# Patient Record
Sex: Female | Born: 1945 | Race: White | Hispanic: No | Smoking: Current every day smoker
Health system: Southern US, Community
[De-identification: ages and names within clinical notes are randomized; demographics above are authoritative.]

## PROBLEM LIST (undated history)

## (undated) DIAGNOSIS — E039 Hypothyroidism, unspecified: Secondary | ICD-10-CM

## (undated) DIAGNOSIS — C3490 Malignant neoplasm of unspecified part of unspecified bronchus or lung: Secondary | ICD-10-CM

## (undated) DIAGNOSIS — E119 Type 2 diabetes mellitus without complications: Secondary | ICD-10-CM

## (undated) DIAGNOSIS — Z8673 Personal history of transient ischemic attack (TIA), and cerebral infarction without residual deficits: Secondary | ICD-10-CM

## (undated) DIAGNOSIS — Z923 Personal history of irradiation: Secondary | ICD-10-CM

## (undated) DIAGNOSIS — R112 Nausea with vomiting, unspecified: Secondary | ICD-10-CM

## (undated) DIAGNOSIS — C801 Malignant (primary) neoplasm, unspecified: Secondary | ICD-10-CM

## (undated) DIAGNOSIS — J449 Chronic obstructive pulmonary disease, unspecified: Secondary | ICD-10-CM

## (undated) DIAGNOSIS — I639 Cerebral infarction, unspecified: Secondary | ICD-10-CM

## (undated) DIAGNOSIS — Z8711 Personal history of peptic ulcer disease: Secondary | ICD-10-CM

## (undated) DIAGNOSIS — F4310 Post-traumatic stress disorder, unspecified: Secondary | ICD-10-CM

## (undated) DIAGNOSIS — F32A Depression, unspecified: Secondary | ICD-10-CM

## (undated) DIAGNOSIS — T4145XA Adverse effect of unspecified anesthetic, initial encounter: Secondary | ICD-10-CM

## (undated) DIAGNOSIS — R52 Pain, unspecified: Secondary | ICD-10-CM

## (undated) DIAGNOSIS — R0602 Shortness of breath: Secondary | ICD-10-CM

## (undated) DIAGNOSIS — F419 Anxiety disorder, unspecified: Secondary | ICD-10-CM

## (undated) DIAGNOSIS — Z8489 Family history of other specified conditions: Secondary | ICD-10-CM

## (undated) DIAGNOSIS — Z9889 Other specified postprocedural states: Secondary | ICD-10-CM

## (undated) DIAGNOSIS — M1990 Unspecified osteoarthritis, unspecified site: Secondary | ICD-10-CM

## (undated) DIAGNOSIS — Z8719 Personal history of other diseases of the digestive system: Secondary | ICD-10-CM

## (undated) DIAGNOSIS — I10 Essential (primary) hypertension: Secondary | ICD-10-CM

## (undated) DIAGNOSIS — F17200 Nicotine dependence, unspecified, uncomplicated: Secondary | ICD-10-CM

## (undated) DIAGNOSIS — N951 Menopausal and female climacteric states: Secondary | ICD-10-CM

## (undated) DIAGNOSIS — J189 Pneumonia, unspecified organism: Secondary | ICD-10-CM

## (undated) DIAGNOSIS — F329 Major depressive disorder, single episode, unspecified: Secondary | ICD-10-CM

## (undated) HISTORY — DX: Major depressive disorder, single episode, unspecified: F32.9

## (undated) HISTORY — DX: Unspecified osteoarthritis, unspecified site: M19.90

## (undated) HISTORY — DX: Malignant neoplasm of unspecified part of unspecified bronchus or lung: C34.90

## (undated) HISTORY — PX: HERNIA REPAIR: SHX51

## (undated) HISTORY — DX: Type 2 diabetes mellitus without complications: E11.9

## (undated) HISTORY — DX: Anxiety disorder, unspecified: F41.9

## (undated) HISTORY — PX: CARPAL TUNNEL RELEASE: SHX101

## (undated) HISTORY — DX: Essential (primary) hypertension: I10

## (undated) HISTORY — PX: SALPINGOOPHORECTOMY: SHX82

## (undated) HISTORY — PX: VAGINAL HYSTERECTOMY: SUR661

## (undated) HISTORY — PX: TONSILLECTOMY: SUR1361

## (undated) HISTORY — PX: NASAL SINUS SURGERY: SHX719

## (undated) HISTORY — DX: Personal history of transient ischemic attack (TIA), and cerebral infarction without residual deficits: Z86.73

## (undated) HISTORY — DX: Depression, unspecified: F32.A

## (undated) HISTORY — DX: Post-traumatic stress disorder, unspecified: F43.10

## (undated) HISTORY — PX: LUMBAR FUSION: SHX111

## (undated) HISTORY — PX: CERVICAL FUSION: SHX112

---

## 2001-12-28 ENCOUNTER — Emergency Department (HOSPITAL_COMMUNITY): Admission: EM | Admit: 2001-12-28 | Discharge: 2001-12-28 | Payer: Self-pay | Admitting: Emergency Medicine

## 2002-10-11 ENCOUNTER — Encounter: Payer: Self-pay | Admitting: Otolaryngology

## 2002-10-11 ENCOUNTER — Ambulatory Visit (HOSPITAL_COMMUNITY): Admission: RE | Admit: 2002-10-11 | Discharge: 2002-10-11 | Payer: Self-pay | Admitting: Otolaryngology

## 2003-01-05 ENCOUNTER — Ambulatory Visit (HOSPITAL_COMMUNITY): Admission: RE | Admit: 2003-01-05 | Discharge: 2003-01-05 | Payer: Self-pay | Admitting: Otolaryngology

## 2003-01-05 ENCOUNTER — Encounter: Payer: Self-pay | Admitting: Otolaryngology

## 2003-05-20 ENCOUNTER — Emergency Department (HOSPITAL_COMMUNITY): Admission: EM | Admit: 2003-05-20 | Discharge: 2003-05-20 | Payer: Self-pay | Admitting: Obstetrics & Gynecology

## 2003-05-20 ENCOUNTER — Encounter: Payer: Self-pay | Admitting: Emergency Medicine

## 2003-10-07 ENCOUNTER — Encounter: Payer: Self-pay | Admitting: Family Medicine

## 2003-10-07 ENCOUNTER — Ambulatory Visit (HOSPITAL_COMMUNITY): Admission: RE | Admit: 2003-10-07 | Discharge: 2003-10-07 | Payer: Self-pay | Admitting: Orthopedic Surgery

## 2004-10-16 ENCOUNTER — Emergency Department (HOSPITAL_COMMUNITY): Admission: EM | Admit: 2004-10-16 | Discharge: 2004-10-16 | Payer: Self-pay | Admitting: Emergency Medicine

## 2004-10-19 ENCOUNTER — Ambulatory Visit (HOSPITAL_COMMUNITY): Admission: RE | Admit: 2004-10-19 | Discharge: 2004-10-19 | Payer: Self-pay | Admitting: Orthopedic Surgery

## 2004-12-28 ENCOUNTER — Ambulatory Visit: Payer: Self-pay | Admitting: Internal Medicine

## 2005-01-16 ENCOUNTER — Ambulatory Visit: Payer: Self-pay | Admitting: Internal Medicine

## 2005-01-17 ENCOUNTER — Inpatient Hospital Stay (HOSPITAL_COMMUNITY): Admission: RE | Admit: 2005-01-17 | Discharge: 2005-01-18 | Payer: Self-pay | Admitting: Orthopaedic Surgery

## 2005-04-18 ENCOUNTER — Encounter (HOSPITAL_COMMUNITY): Admission: RE | Admit: 2005-04-18 | Discharge: 2005-05-18 | Payer: Self-pay | Admitting: Orthopaedic Surgery

## 2005-06-06 ENCOUNTER — Ambulatory Visit (HOSPITAL_COMMUNITY): Admission: RE | Admit: 2005-06-06 | Discharge: 2005-06-06 | Payer: Self-pay | Admitting: Otolaryngology

## 2006-01-21 ENCOUNTER — Emergency Department (HOSPITAL_COMMUNITY): Admission: EM | Admit: 2006-01-21 | Discharge: 2006-01-21 | Payer: Self-pay | Admitting: Emergency Medicine

## 2006-03-14 ENCOUNTER — Emergency Department (HOSPITAL_COMMUNITY): Admission: EM | Admit: 2006-03-14 | Discharge: 2006-03-15 | Payer: Self-pay | Admitting: Emergency Medicine

## 2006-06-09 ENCOUNTER — Ambulatory Visit: Payer: Self-pay | Admitting: Physical Medicine & Rehabilitation

## 2006-06-09 ENCOUNTER — Encounter
Admission: RE | Admit: 2006-06-09 | Discharge: 2006-09-07 | Payer: Self-pay | Admitting: Physical Medicine & Rehabilitation

## 2006-06-30 ENCOUNTER — Ambulatory Visit (HOSPITAL_COMMUNITY): Admission: RE | Admit: 2006-06-30 | Discharge: 2006-06-30 | Payer: Self-pay | Admitting: General Surgery

## 2006-06-30 ENCOUNTER — Encounter (INDEPENDENT_AMBULATORY_CARE_PROVIDER_SITE_OTHER): Payer: Self-pay | Admitting: Specialist

## 2007-03-24 ENCOUNTER — Ambulatory Visit (HOSPITAL_COMMUNITY): Admission: RE | Admit: 2007-03-24 | Discharge: 2007-03-24 | Payer: Self-pay | Admitting: Family Medicine

## 2007-05-20 ENCOUNTER — Ambulatory Visit (HOSPITAL_COMMUNITY): Admission: RE | Admit: 2007-05-20 | Discharge: 2007-05-20 | Payer: Self-pay | Admitting: Family Medicine

## 2007-11-23 ENCOUNTER — Ambulatory Visit (HOSPITAL_COMMUNITY): Admission: RE | Admit: 2007-11-23 | Discharge: 2007-11-23 | Payer: Self-pay | Admitting: Family Medicine

## 2007-11-27 ENCOUNTER — Ambulatory Visit (HOSPITAL_COMMUNITY): Admission: RE | Admit: 2007-11-27 | Discharge: 2007-11-27 | Payer: Self-pay | Admitting: Family Medicine

## 2007-12-03 ENCOUNTER — Ambulatory Visit (HOSPITAL_COMMUNITY): Admission: RE | Admit: 2007-12-03 | Discharge: 2007-12-03 | Payer: Self-pay

## 2008-08-03 ENCOUNTER — Ambulatory Visit (HOSPITAL_COMMUNITY): Admission: RE | Admit: 2008-08-03 | Discharge: 2008-08-03 | Payer: Self-pay | Admitting: Family Medicine

## 2008-08-30 ENCOUNTER — Ambulatory Visit (HOSPITAL_COMMUNITY): Admission: RE | Admit: 2008-08-30 | Discharge: 2008-08-30 | Payer: Self-pay | Admitting: Family Medicine

## 2008-09-13 ENCOUNTER — Ambulatory Visit (HOSPITAL_COMMUNITY): Admission: RE | Admit: 2008-09-13 | Discharge: 2008-09-13 | Payer: Self-pay | Admitting: Family Medicine

## 2009-06-10 ENCOUNTER — Emergency Department (HOSPITAL_COMMUNITY): Admission: EM | Admit: 2009-06-10 | Discharge: 2009-06-10 | Payer: Self-pay | Admitting: Emergency Medicine

## 2009-07-02 ENCOUNTER — Emergency Department (HOSPITAL_COMMUNITY): Admission: EM | Admit: 2009-07-02 | Discharge: 2009-07-02 | Payer: Self-pay | Admitting: Emergency Medicine

## 2009-12-27 ENCOUNTER — Ambulatory Visit (HOSPITAL_COMMUNITY): Admission: RE | Admit: 2009-12-27 | Discharge: 2009-12-27 | Payer: Self-pay | Admitting: Family Medicine

## 2010-01-04 ENCOUNTER — Emergency Department (HOSPITAL_COMMUNITY): Admission: EM | Admit: 2010-01-04 | Discharge: 2010-01-04 | Payer: Self-pay | Admitting: Emergency Medicine

## 2010-01-11 ENCOUNTER — Ambulatory Visit: Payer: Self-pay | Admitting: Orthopedic Surgery

## 2010-01-11 DIAGNOSIS — S92309A Fracture of unspecified metatarsal bone(s), unspecified foot, initial encounter for closed fracture: Secondary | ICD-10-CM

## 2010-01-11 DIAGNOSIS — S92209A Fracture of unspecified tarsal bone(s) of unspecified foot, initial encounter for closed fracture: Secondary | ICD-10-CM

## 2010-01-15 ENCOUNTER — Telehealth: Payer: Self-pay | Admitting: Orthopedic Surgery

## 2010-01-17 ENCOUNTER — Encounter: Payer: Self-pay | Admitting: Orthopedic Surgery

## 2010-01-23 ENCOUNTER — Telehealth: Payer: Self-pay | Admitting: Orthopedic Surgery

## 2010-01-29 ENCOUNTER — Telehealth: Payer: Self-pay | Admitting: Orthopedic Surgery

## 2010-02-26 ENCOUNTER — Ambulatory Visit: Payer: Self-pay | Admitting: Orthopedic Surgery

## 2010-02-27 ENCOUNTER — Telehealth: Payer: Self-pay | Admitting: Orthopedic Surgery

## 2010-03-06 ENCOUNTER — Telehealth: Payer: Self-pay | Admitting: Orthopedic Surgery

## 2010-03-28 ENCOUNTER — Emergency Department (HOSPITAL_COMMUNITY): Admission: EM | Admit: 2010-03-28 | Discharge: 2010-03-28 | Payer: Self-pay | Admitting: Emergency Medicine

## 2010-04-16 ENCOUNTER — Encounter: Admission: RE | Admit: 2010-04-16 | Discharge: 2010-04-16 | Payer: Self-pay | Admitting: Orthopedic Surgery

## 2010-05-08 ENCOUNTER — Ambulatory Visit: Payer: Self-pay | Admitting: Vascular Surgery

## 2010-05-30 ENCOUNTER — Encounter: Payer: Self-pay | Admitting: Orthopedic Surgery

## 2010-06-12 ENCOUNTER — Ambulatory Visit (HOSPITAL_COMMUNITY): Admission: RE | Admit: 2010-06-12 | Discharge: 2010-06-12 | Payer: Self-pay | Admitting: Family Medicine

## 2010-06-25 ENCOUNTER — Ambulatory Visit (HOSPITAL_COMMUNITY): Admission: RE | Admit: 2010-06-25 | Discharge: 2010-06-25 | Payer: Self-pay | Admitting: Family Medicine

## 2010-08-05 ENCOUNTER — Inpatient Hospital Stay (HOSPITAL_COMMUNITY)
Admission: EM | Admit: 2010-08-05 | Discharge: 2010-08-07 | Payer: Self-pay | Source: Home / Self Care | Admitting: Emergency Medicine

## 2010-12-30 DIAGNOSIS — I639 Cerebral infarction, unspecified: Secondary | ICD-10-CM

## 2010-12-30 HISTORY — DX: Cerebral infarction, unspecified: I63.9

## 2011-01-19 ENCOUNTER — Encounter: Payer: Self-pay | Admitting: Otolaryngology

## 2011-01-21 ENCOUNTER — Ambulatory Visit (HOSPITAL_COMMUNITY)
Admission: RE | Admit: 2011-01-21 | Discharge: 2011-01-21 | Payer: Self-pay | Source: Home / Self Care | Attending: Family Medicine | Admitting: Family Medicine

## 2011-01-21 ENCOUNTER — Encounter: Payer: Self-pay | Admitting: Internal Medicine
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
## 2011-01-31 NOTE — Progress Notes (Signed)
Summary: pain med not helping, and has chest pain  Phone Note Call from Patient Call back at Home Phone [phone]   Summary of Call: called stating that the pain med is not helping, and she is on Norco 10, I advised that the med is strong and should help her pain, she is allergic to NSAIDS, she said walker is making her arms hurt, advised we will send rx for a wheelchair, said she has chest pain, advised go to er or PCP. Initial call taken by: Ether Griffins,  January 15, 2010 10:41 AM    New/Updated Medications: WHEELCHAIR  MISC (MISC. DEVICES) standard wheelchair  has fracture of foot Prescriptions: WHEELCHAIR  MISC (MISC. DEVICES) standard wheelchair  has fracture of foot  #1 x 0   Entered by:   Ether Griffins   Authorized by:   Fuller Canada MD   Signed by:   Ether Griffins on 01/15/2010   Method used:   Faxed to ...       [facility] (retail)       [address]       [place], [place]  [address]       Ph: [phone]       Fax: [phone]   RxID:   [id number]
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
## 2011-01-31 NOTE — Letter (Signed)
Summary: Medical record request [facility]  Medical record request [facility]   Imported By: Cammie Sickle 06/20/2010 20:41:12  _____________________________________________________________________  External Attachment:    Type:   Image     Comment:   External Document

## 2011-01-31 NOTE — Progress Notes (Signed)
Summary: call from patient foot swollen today  Phone Note Call from Patient   Caller: Patient Summary of Call: Patient called to relay that her injured foot is very swollen today, top and bottom of foot. Had appointment yesterday 02/26/10 and was released, as needed appointment.  Advised about elevating, icing, etc.  She said she has been doing this, and has been back in wheelchair today. Please call her [phone] if other recommendations. Initial call taken by: Cammie Sickle,  February 27, 2010 10:46 AM
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
## 2011-01-31 NOTE — Progress Notes (Signed)
Summary: wants pain med/foot still swollen  Phone Note Call from Patient   Summary of Call: Shaquayla Klimas (September 13, 2046) left a message requesting for you to call in Vicodin 5 mg  to [facility] for her residual foot pain. Said her foot is still swollen and asked for advice on what she can do.  Her # [phone] Initial call taken by: Jacklynn Ganong,  March 06, 2010 2:22 PM  Follow-up for Phone Call        no more pain medication   [and need no other appts] Follow-up by: Fuller Canada MD,  March 06, 2010 2:26 PM  Additional Follow-up for Phone Call Additional follow up Details #1::        Routed to [facility] to call patient Additional Follow-up by: Jacklynn Ganong,  March 06, 2010 3:40 PM    Additional Follow-up for Phone Call Additional follow up Details #2::    Advised patient.  She said thanks and quickly hung up. Follow-up by: Cammie Sickle,  March 06, 2010 4:37 PM

## 2011-01-31 NOTE — Progress Notes (Signed)
Summary: requested early pain med refill  Phone Note Call from Patient Call back at [facility] Phone [phone]   Summary of Call: patient wanted early refill on medication advised no Initial call taken by: Ether Griffins,  January 23, 2010 10:23 AM

## 2011-01-31 NOTE — Letter (Signed)
Summary: History form  History form   Imported By: Jacklynn Ganong 01/15/2010 14:45:01  _____________________________________________________________________  External Attachment:    Type:   Image     Comment:   External Document

## 2011-03-15 LAB — CBC
HCT: 37.9 % (ref 36.0–46.0)
Hemoglobin: 12.8 g/dL (ref 12.0–15.0)
MCH: 32.1 pg (ref 26.0–34.0)
MCHC: 33.9 g/dL (ref 30.0–36.0)
MCV: 94.9 fL (ref 78.0–100.0)
Platelets: 247 K/uL (ref 150–400)
RBC: 3.99 MIL/uL (ref 3.87–5.11)
RDW: 13.7 % (ref 11.5–15.5)
WBC: 8.9 K/uL (ref 4.0–10.5)

## 2011-03-15 LAB — BASIC METABOLIC PANEL WITH GFR
BUN: 2 mg/dL — ABNORMAL LOW (ref 6–23)
BUN: 2 mg/dL — ABNORMAL LOW (ref 6–23)
CO2: 31 meq/L (ref 19–32)
CO2: 32 meq/L (ref 19–32)
Calcium: 8.2 mg/dL — ABNORMAL LOW (ref 8.4–10.5)
Calcium: 8.4 mg/dL (ref 8.4–10.5)
Chloride: 103 meq/L (ref 96–112)
Chloride: 104 meq/L (ref 96–112)
Creatinine, Ser: 0.5 mg/dL (ref 0.4–1.2)
Creatinine, Ser: 0.54 mg/dL (ref 0.4–1.2)
GFR calc Af Amer: >60 mL/min (ref >60)
GFR calc Af Amer: >60 mL/min (ref >60)
GFR calc non Af Amer: >60 mL/min (ref >60)
GFR calc non Af Amer: >60 mL/min (ref >60)
Glucose, Bld: 91 mg/dL (ref 70–99)
Glucose, Bld: 92 mg/dL (ref 70–99)
Potassium: 2.8 meq/L — ABNORMAL LOW (ref 3.5–5.1)
Potassium: 3 meq/L — ABNORMAL LOW (ref 3.5–5.1)
Sodium: 140 meq/L (ref 135–145)
Sodium: 142 meq/L (ref 135–145)

## 2011-03-15 LAB — WOUND CULTURE

## 2011-03-15 LAB — DIFFERENTIAL
Basophils Absolute: 0 K/uL (ref 0.0–0.1)
Basophils Relative: 0 % (ref 0–1)
Eosinophils Absolute: 0.1 K/uL (ref 0.0–0.7)
Eosinophils Relative: 1 % (ref 0–5)
Lymphocytes Relative: 28 % (ref 12–46)
Lymphs Abs: 2.5 K/uL (ref 0.7–4.0)
Monocytes Absolute: 0.5 K/uL (ref 0.1–1.0)
Monocytes Relative: 6 % (ref 3–12)
Neutro Abs: 5.8 K/uL (ref 1.7–7.7)
Neutrophils Relative %: 65 % (ref 43–77)

## 2011-03-15 LAB — PROTIME-INR: Prothrombin Time: 13.5 seconds (ref 11.6–15.2)

## 2011-03-15 LAB — GLUCOSE, CAPILLARY: Glucose-Capillary: 88 mg/dL (ref 70–99)

## 2011-03-15 LAB — BASIC METABOLIC PANEL
BUN: 5 mg/dL — ABNORMAL LOW (ref 6–23)
CO2: 33 mEq/L — ABNORMAL HIGH (ref 19–32)
Chloride: 101 mEq/L (ref 96–112)
Creatinine, Ser: 0.54 mg/dL (ref 0.4–1.2)

## 2011-03-17 LAB — COMPREHENSIVE METABOLIC PANEL
ALT: 11 U/L (ref 0–35)
AST: 18 U/L (ref 0–37)
Albumin: 3.2 g/dL — ABNORMAL LOW (ref 3.5–5.2)
Alkaline Phosphatase: 87 U/L (ref 39–117)
Chloride: 101 mEq/L (ref 96–112)
GFR calc Af Amer: >60 mL/min (ref >60)
Potassium: 3.6 mEq/L (ref 3.5–5.1)
Sodium: 143 mEq/L (ref 135–145)
Total Bilirubin: 0.4 mg/dL (ref 0.3–1.2)

## 2011-03-17 LAB — CBC
HCT: 43.6 % (ref 36.0–46.0)
Platelets: 247 10*3/uL (ref 150–400)
WBC: 8.1 10*3/uL (ref 4.0–10.5)

## 2011-03-17 LAB — DIFFERENTIAL
Basophils Absolute: 0 10*3/uL (ref 0.0–0.1)
Basophils Relative: 1 % (ref 0–1)
Eosinophils Relative: 1 % (ref 0–5)
Monocytes Absolute: 0.4 10*3/uL (ref 0.1–1.0)

## 2011-04-07 LAB — COMPREHENSIVE METABOLIC PANEL
ALT: 11 U/L (ref 0–35)
Alkaline Phosphatase: 71 U/L (ref 39–117)
BUN: 2 mg/dL — ABNORMAL LOW (ref 6–23)
CO2: 32 mEq/L (ref 19–32)
GFR calc non Af Amer: >60 mL/min (ref >60)
Glucose, Bld: 101 mg/dL — ABNORMAL HIGH (ref 70–99)
Potassium: 3.3 mEq/L — ABNORMAL LOW (ref 3.5–5.1)
Total Bilirubin: 0.4 mg/dL (ref 0.3–1.2)
Total Protein: 5.8 g/dL — ABNORMAL LOW (ref 6.0–8.3)

## 2011-04-07 LAB — DIFFERENTIAL
Basophils Absolute: 0 10*3/uL (ref 0.0–0.1)
Basophils Relative: 0 % (ref 0–1)
Eosinophils Absolute: 0.1 10*3/uL (ref 0.0–0.7)
Monocytes Relative: 6 % (ref 3–12)
Neutro Abs: 4.4 10*3/uL (ref 1.7–7.7)
Neutrophils Relative %: 68 % (ref 43–77)

## 2011-04-07 LAB — URINALYSIS, ROUTINE W REFLEX MICROSCOPIC
Bilirubin Urine: NEGATIVE
Glucose, UA: NEGATIVE mg/dL
Ketones, ur: NEGATIVE mg/dL
Specific Gravity, Urine: <1.005 — ABNORMAL LOW (ref 1.005–1.030)
pH: 5.5 (ref 5.0–8.0)

## 2011-04-07 LAB — CBC
HCT: 36.9 % (ref 36.0–46.0)
Hemoglobin: 12.8 g/dL (ref 12.0–15.0)
RBC: 3.78 MIL/uL — ABNORMAL LOW (ref 3.87–5.11)
RDW: 13.3 % (ref 11.5–15.5)
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
## 2011-05-17 NOTE — Assessment & Plan Note (Signed)
[name] is back regarding her chronic pain syndrome, predominantly in the neck  and low back.  Since I last saw her, she had an abdominal hernia repair by  Dr. Lovell Sheehan.  She received hydrocodone, #40, from him, which she has  completed.  She also had some minor skin surgery performed to remove a mole  on a spot from the face.  Generally, she has done well with those surgeries.  She has liked the changes we have made with her medications and has done  well with the fentanyl patch.  She uses a Lidoderm patch as well with good  results.  Uses oxycodone 5 mg daily for breakthrough pain.  In general, she  is well pleased with how she is doing with the medication changes.  She has  had some resolution in family matters, which has helped as well.   Patient did test positive for marijuana in her urine drug screen on  admission.  Patient states that she has not smoked any marijuana for over 6-  7 weeks and that was a limited exposure.   Patient describes the pain as dull, rates it as a 5/10.  The pain interferes  with general activity, relationships with others, and enjoyment of life on a  moderate level.  She can walk about 15 minutes without having to stop.  She  sometimes uses a cane for balance but generally walks without a device.   REVIEW OF SYSTEMS:  Patient reports some tremor and occasional spasms.  Anxiety.  She states that she would do well with an occasional Xanax for  breakthrough anxiety, especially with some of her complex family reactions.  The patient has had some recent wheezing and respiratory infections as well.   SOCIAL HISTORY:  Patient lives with her granddaughter and still smokes about  a half pack per day.   PHYSICAL EXAMINATION:  VITAL SIGNS:  Blood pressure 130/96, pulse 68,  respiratory rate 16.  She is satting at 96% on room air.  GENERAL:  Patient is pleasant in no acute distress.  She is alert and  oriented x3.  Affect is bright and appropriate.  Gait is stable.   She has  some tenderness along the abdominal surgery site, but this appears to be  healing nicely.  She has a Lidoderm patch over the area, which provided  relief.  HEART:  Regular rate and rhythm.  LUNGS:  Clear.  ABDOMEN:  Soft and nontender.  NEUROMUSCULAR:  Coordination is generally intact.  Reflexes are 2+.  Sensation is intact in all four extremities.  Motor function is 5/5.  She  had excellent back range of motion today as well as cervical movement.  I  saw no significant pain in the patient.  She had some mild tenderness along  the lumbar facets with palpation as well as passive movement of the neck to  extreme flexion and rotational borders.   ASSESSMENT:  1.  Post laminectomy cervical and lumbar syndromes.  2.  History of marijuana use.  3.  Anxiety disorder.   PLAN:  1.  Continue fentanyl patch 12.5 mcg q.72h.  2.  Refill oxycodone 5 mg daily p.r.n. #30.  3.  I did give her a limited supply of Xanax 0.5 mg daily p.r.n. severe      anxiety.  4.  We retested her urine today.  I had informed her that if she should test      positive for marijuana again, that she will be discharged from the  clinic.  Patient voiced understanding of this and was confident that      this would no      longer be an issue.  5.  I will see the patient back in about three months time.  She will have      an RN visit in one month.      Ranelle Oyster, M.D.  Electronically Signed     ZTS/MedQ  D:  07/08/2006 44:01:02  T:  07/08/2006 17:26:03  Job #:  [id number]   cc:   Sharolyn Douglas, M.D.  Fax: [phone]
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
## 2011-05-17 NOTE — Op Note (Signed)
NAME:  [name], [name]              ACCOUNT NO.:  [id number]   MEDICAL RECORD NO.:  [id number]          PATIENT TYPE:  AMB   LOCATION:  DAY                           FACILITY:  [facility]   PHYSICIAN:  Dalia Heading, M.D.  DATE OF BIRTH:  Jun 14, 1946   DATE OF PROCEDURE:  06/30/2006  DATE OF DISCHARGE:                                 OPERATIVE REPORT   PREOPERATIVE DIAGNOSES:  Umbilical hernia, benign skin lesions, face.   POSTOPERATIVE DIAGNOSES:  Umbilical hernia, benign skin lesions, face.   PROCEDURE:  Umbilical herniorrhaphy, shave removal of multiple skin lesions,  face.   SURGEON:  Dr. Franky Macho   ANESTHESIA:  General.   INDICATIONS:  The patient is a 65 year old white female presents with both  an umbilical hernia and several benign skin lesions on her right eyelid.  Three of them are skin tags and the fourth one is a small seborrheic  keratotic lesion.  The patient requests removal of these lesions as well as  a repair of her umbilical hernia.  The risks and benefits of all procedures  including bleeding, infection, recurrence were fully explained to the  patient, who gave informed consent.   PROCEDURE NOTE:  The patient was placed in the supine position.  After  induction of general endotracheal anesthesia, the abdomen was prepped and  draped using the usual sterile technique with Betadine.  Surgical site  confirmation was performed.   An infraumbilical incision was made down to the fascia.  The umbilicus was  freed away from the underlying hernia sac.  Properitoneal fat was noted  within the hernia sac.  This was excised at the fascial level.  The fascia  was then reapproximated transversely using O Surgidac interrupted sutures.  The base of the umbilicus was secured back to the fascia using a 2-0 Vicryl  suture.  The subcutaneous layer was closed using 3-0 Vicryl interrupted  sutures.  The skin was closed using staples.  Sensorcaine 0.5% was instilled  in the  surrounding wound.  Betadine ointment and dry sterile dressing were  applied.   Next, the right eyelid was prepped with Betadine.  Three skin tags were  shaved off using cautery.  A seborrheic keratotic lesion along the medial  canthal fold was also excised without difficulty.  This was done using shave  removal and cautery.  Ophthalmic bacitracin ointment was then applied.   All tape and needle counts were correct at the end of the procedure.  The  patient was awakened and transferred to PACU in stable condition.   COMPLICATIONS:  None.   SPECIMEN:  Benign skin lesion, right eyelid.   BLOOD LOSS:  None.      Dalia Heading, M.D.  Electronically Signed     MAJ/MEDQ  D:  06/30/2006  T:  06/30/2006  Job:  [id number]   cc:   Dalia Heading, M.D.  Fax: [phone]   [name], MD  Fax: [phone]

## 2011-05-17 NOTE — Op Note (Signed)
[name], [name]              ACCOUNT NO.:  [id number]   MEDICAL RECORD NO.:  [id number]          PATIENT TYPE:  OIB   LOCATION:  5004                         FACILITY:  [facility]   PHYSICIAN:  Sharolyn Douglas, M.D.        DATE OF BIRTH:  July 10, 1946   DATE OF PROCEDURE:  01/17/2005  DATE OF DISCHARGE:                                 OPERATIVE REPORT   DIAGNOSIS:  Cervical spondylotic radiculopathy.   PROCEDURE:  1.  Anterior cervical diskectomy C5-6 and C6-7.  2.  Anterior cervical arthrodesis C5-6, C6-7 placement of two allograft      prosthesis spacers packed with local autogenous bone graft.  3.  Anterior cervical plating C5-C7 using the spinal concepts system.   SURGEON:  Sharolyn Douglas, M.D.   ASSISTANT:  Verlin Fester, P.A.   ANESTHESIA:  General endotracheal.   COMPLICATIONS:  None.   INDICATIONS:  The patient is a pleasant 65 year old female with persistent  neck and right upper extremity pain thought to be secondary to cervical  spondylosis at C5-6, C6-7. She failed conservative treatment and elected to  undergo ACDF at C5-6 and C6-7 in hopes of improving her symptoms. Risks and  benefits reviewed.   PROCEDURE:  The patient was properly identified in the holding area, taken  to the operating room, underwent general endotracheal anesthesia without  difficulty. Given prophylactic IV antibiotics. Carefully positioned with the  [place] head rest, neck in neutral position. Neck was prepped, draped usual  sterile fashion. A transverse incision was made level of the cricoid  cartilage on the left side. Dissection was carried sharply through the  platysma. The antral between the SCM the strap muscles was developed down  the prevertebral space. The C5-6 disk space was identified by the anterior  osteophyte, spinal needle placed and intraoperative x-ray confirmed  location. The longus coli muscle elevated out of the C5-6, C6-7 disk spaces  bilaterally. Deep Shadowline retractor  placed. The surgical microscope was  brought into the field. Starting at C6-7, a diskectomy was completed back to  the posterior longitudinal ligament. Caspar distraction pins were placed in  the C6 and C7 vertebral bodies. Gentle distraction applied. High-speed bur  used to take down the cartilaginous endplates and the uncovertebral joints.  The posterior longitudinal ligament was taken down. A subligamentous disk  herniation was removed from the foramen on the right side. Wide  foraminotomies were completed. We then turned our attention to the C5-6  level. The distraction pin was removed from C7 and inserted into the C5  vertebral body. The disk space was severely narrowed at this level. As we  applied distraction, the pin in the C6 body cut out. We therefore removed  the pins and proceeded to complete the operation without distraction. High-  speed bur was used to widen the intervertebral space. The decompression was  carried back to the posterior cortex. We then inserted a 5 mm lordotic  allograft prosthesis spacer packed with local autogenous bone graft from the  drill shavings. We felt we had good distraction of the interspace by using  the wedge-shaped lordotic  graft. We utilized an 8 mm intervertebral  prosthesis spacer at C6-7 and packed with local bone graft obtained from the  drill shavings. We countersunk the grafts 1 mm. We then placed an anterior  cervical plate from G3-O7 using the spinal concepts system. Six 12 mm screws  were utilized. The bone quality was very soft and the screw purchase was  marginal. We ensured the locking mechanism engaged. Wound was irrigated.  Deep Penrose drain was left in place. Platysma closed with interrupted 2-0  Vicryl, subcutaneous layer closed with interrupted 3-0 Vicryl followed by a  running 4-0 subcuticular Vicryl suture on the skin edges. Benzoin, Steri-  Strips placed. Cervical collar was applied. The patient was extubated  without  difficulty and transferred to recovery in stable condition.      MC/MEDQ  D:  01/17/2005  T:  01/18/2005  Job:  [id number]

## 2011-05-17 NOTE — H&P (Signed)
NAME:  DELVA, DERDEN              ACCOUNT NO.:  [id number]   MEDICAL RECORD NO.:  [id number]          PATIENT TYPE:  AMB   LOCATION:  DAY                           FACILITY:  [facility]   PHYSICIAN:  Dalia Heading, M.D.  DATE OF BIRTH:  1946-03-16   DATE OF ADMISSION:  06/30/2006  DATE OF DISCHARGE:  LH                                HISTORY & PHYSICAL   CHIEF COMPLAINT:  Umbilical hernia, skin lesions on face.   HISTORY OF PRESENT ILLNESS:  The patient is a 65 year old white female who  was referred for evaluation and treatment of an umbilical hernia.  It has  been present for some time but is causing her increasing discomfort.  No  nausea or vomiting have been noted.  She also has a right intercanthal eye  skin lesion which she would like to remove along with several skin tags over  the right eyelid.   PAST MEDICAL HISTORY:  Includes:  1.  Chronic back pain.  2.  Depression.  3.  Hypertension.  4.  Extrinsic allergies.   PAST SURGICAL HISTORY:  1.  Spinal cervical spinal fusion.  2.  Lower lumbar spinal fusion.  3.  Right oophorectomy.  4.  Carpal tunnel release bilaterally.  5.  Hysterectomy.   CURRENT MEDICATIONS:  1.  Trazodone 150 mg p.o. daily.  2.  Lisinopril 20 mg p.o. daily.  3.  Fentanyl patches.  4.  Lidocaine patches.  5.  Zoloft 100 mg p.o. q.a.m.  6.  Allegra 1 tablet p.o. daily.  7.  Atenolol 25 mg p.o. b.i.d.  8.  Vicodin p.r.n. pain.   ALLERGIES:  ASPIRIN and NONSTEROIDAL ANTI-INFLAMMATORIES.   REVIEW OF SYSTEMS:  The patient does smoke one-half pack of cigarettes a  day.  She denies alcohol use.  She denies any recent angina, chest pain, MI,  CVA, or diabetes mellitus.   PHYSICAL EXAMINATION:  GENERAL: The patient is a well-developed, well-  nourished white female in no acute distress.  SKIN: Examination reveals seborrheic keratotic lesion along the right inner  canthal eye fold with several skin tags present along the right eyelid.  LUNGS:  Clear  to auscultation with equal breath sounds bilaterally.  HEART:  Examination reveals regular rate without S3, S4, or murmurs.  ABDOMEN: The abdomen is soft, nontender, nondistended.  No  hepatosplenomegaly or masses noted.  A small reducible umbilical hernia is  present.   IMPRESSION:  1.  Umbilical hernia.  2.  Skin lesions, right eye canthal fold.   PLAN:  The patient is scheduled for umbilical herniorrhaphy with removal a  skin lesions on the face on June 30, 2006.  The risks and benefits of the  procedure including bleeding, infection, recurrence of the hernia were fully  explained to the patient, gave informed consent.      Dalia Heading, M.D.  Electronically Signed     MAJ/MEDQ  D:  06/24/2006  T:  06/24/2006  Job:  [id number]   cc:   [facility]  Fax: [phone]   [name], MD  Fax: [phone]

## 2011-08-05 ENCOUNTER — Inpatient Hospital Stay (HOSPITAL_COMMUNITY)
Admission: EM | Admit: 2011-08-05 | Discharge: 2011-08-10 | DRG: 690 | Disposition: A | Discharge status: Other Acute Inpatient Hospital | Payer: Medicare Other | Attending: Internal Medicine | Admitting: Internal Medicine

## 2011-08-05 DIAGNOSIS — Z8673 Personal history of transient ischemic attack (TIA), and cerebral infarction without residual deficits: Secondary | ICD-10-CM

## 2011-08-05 DIAGNOSIS — R404 Transient alteration of awareness: Secondary | ICD-10-CM | POA: Diagnosis present

## 2011-08-05 DIAGNOSIS — F1010 Alcohol abuse, uncomplicated: Secondary | ICD-10-CM | POA: Diagnosis present

## 2011-08-05 DIAGNOSIS — F1210 Cannabis abuse, uncomplicated: Secondary | ICD-10-CM | POA: Diagnosis present

## 2011-08-05 DIAGNOSIS — F1510 Other stimulant abuse, uncomplicated: Secondary | ICD-10-CM | POA: Diagnosis present

## 2011-08-05 DIAGNOSIS — F17200 Nicotine dependence, unspecified, uncomplicated: Secondary | ICD-10-CM | POA: Diagnosis present

## 2011-08-05 DIAGNOSIS — J4489 Other specified chronic obstructive pulmonary disease: Secondary | ICD-10-CM | POA: Diagnosis present

## 2011-08-05 DIAGNOSIS — F1110 Opioid abuse, uncomplicated: Secondary | ICD-10-CM | POA: Diagnosis present

## 2011-08-05 DIAGNOSIS — E876 Hypokalemia: Secondary | ICD-10-CM | POA: Diagnosis present

## 2011-08-05 DIAGNOSIS — R197 Diarrhea, unspecified: Secondary | ICD-10-CM | POA: Diagnosis present

## 2011-08-05 DIAGNOSIS — Z79899 Other long term (current) drug therapy: Secondary | ICD-10-CM

## 2011-08-05 DIAGNOSIS — J449 Chronic obstructive pulmonary disease, unspecified: Secondary | ICD-10-CM | POA: Diagnosis present

## 2011-08-05 DIAGNOSIS — I10 Essential (primary) hypertension: Secondary | ICD-10-CM | POA: Diagnosis present

## 2011-08-05 DIAGNOSIS — B372 Candidiasis of skin and nail: Secondary | ICD-10-CM | POA: Diagnosis present

## 2011-08-05 DIAGNOSIS — N390 Urinary tract infection, site not specified: Principal | ICD-10-CM | POA: Diagnosis present

## 2011-08-05 DIAGNOSIS — E871 Hypo-osmolality and hyponatremia: Secondary | ICD-10-CM | POA: Diagnosis present

## 2011-08-05 LAB — URINE MICROSCOPIC-ADD ON

## 2011-08-05 LAB — URINALYSIS, ROUTINE W REFLEX MICROSCOPIC
Glucose, UA: NEGATIVE mg/dL
Specific Gravity, Urine: 1.026 (ref 1.005–1.030)
pH: 6 (ref 5.0–8.0)

## 2011-08-06 ENCOUNTER — Emergency Department (HOSPITAL_COMMUNITY): Payer: Medicare Other

## 2011-08-06 ENCOUNTER — Inpatient Hospital Stay (HOSPITAL_COMMUNITY): Payer: Medicare Other

## 2011-08-06 DIAGNOSIS — F05 Delirium due to known physiological condition: Secondary | ICD-10-CM

## 2011-08-06 LAB — URINALYSIS, ROUTINE W REFLEX MICROSCOPIC
Bilirubin Urine: NEGATIVE
Ketones, ur: 40 mg/dL — AB
Nitrite: NEGATIVE
Protein, ur: NEGATIVE mg/dL

## 2011-08-06 LAB — CARDIAC PANEL(CRET KIN+CKTOT+MB+TROPI)
CK, MB: 6.1 ng/mL (ref 0.3–4.0)
CK, MB: 7.5 ng/mL (ref 0.3–4.0)
CK, MB: 7.6 ng/mL (ref 0.3–4.0)
Relative Index: 3.8 — ABNORMAL HIGH (ref 0.0–2.5)
Relative Index: 3.9 — ABNORMAL HIGH (ref 0.0–2.5)
Relative Index: 4.1 — ABNORMAL HIGH (ref 0.0–2.5)
Total CK: 162 U/L (ref 7–177)
Total CK: 191 U/L — ABNORMAL HIGH (ref 7–177)
Total CK: 185 U/L — ABNORMAL HIGH (ref 7–177)
Troponin I: <0.3 ng/mL
Troponin I: <0.3 ng/mL
Troponin I: <0.3 ng/mL

## 2011-08-06 LAB — DIFFERENTIAL
Basophils Relative: 1 % (ref 0–1)
Lymphs Abs: 2 10*3/uL (ref 0.7–4.0)
Monocytes Absolute: 0.5 10*3/uL (ref 0.1–1.0)
Monocytes Relative: 6 % (ref 3–12)
Neutro Abs: 6 10*3/uL (ref 1.7–7.7)

## 2011-08-06 LAB — TSH: TSH: 2.415 u[IU]/mL (ref 0.350–4.500)

## 2011-08-06 LAB — COMPREHENSIVE METABOLIC PANEL WITH GFR
ALT: 6 U/L (ref 0–35)
AST: 13 U/L (ref 0–37)
Albumin: 3.9 g/dL (ref 3.5–5.2)
Alkaline Phosphatase: 79 U/L (ref 39–117)
BUN: 10 mg/dL (ref 6–23)
CO2: 29 meq/L (ref 19–32)
Calcium: 9.1 mg/dL (ref 8.4–10.5)
Chloride: 96 meq/L (ref 96–112)
Creatinine, Ser: 0.61 mg/dL (ref 0.50–1.10)
GFR calc Af Amer: >60 mL/min
GFR calc non Af Amer: >60 mL/min
Glucose, Bld: 99 mg/dL (ref 70–99)
Potassium: 3.1 meq/L — ABNORMAL LOW (ref 3.5–5.1)
Sodium: 135 meq/L (ref 135–145)
Total Bilirubin: 0.4 mg/dL (ref 0.3–1.2)
Total Protein: 7.2 g/dL (ref 6.0–8.3)

## 2011-08-06 LAB — GLUCOSE, CAPILLARY
Glucose-Capillary: 97 mg/dL (ref 70–99)
Glucose-Capillary: 101 mg/dL — ABNORMAL HIGH (ref 70–99)
Glucose-Capillary: 110 mg/dL — ABNORMAL HIGH (ref 70–99)

## 2011-08-06 LAB — CBC
HCT: 40.2 % (ref 36.0–46.0)
Hemoglobin: 13.9 g/dL (ref 12.0–15.0)
MCH: 31.7 pg (ref 26.0–34.0)
MCHC: 34.6 g/dL (ref 30.0–36.0)
MCV: 91.8 fL (ref 78.0–100.0)

## 2011-08-06 LAB — RAPID URINE DRUG SCREEN, HOSP PERFORMED
Amphetamines: POSITIVE — AB
Opiates: POSITIVE — AB

## 2011-08-06 LAB — AMMONIA: Ammonia: 27 umol/L (ref 11–60)

## 2011-08-06 LAB — ETHANOL: Alcohol, Ethyl (B): <11 mg/dL (ref 0–11)

## 2011-08-06 LAB — URINE MICROSCOPIC-ADD ON

## 2011-08-06 LAB — MRSA PCR SCREENING: MRSA by PCR: POSITIVE — AB

## 2011-08-06 NOTE — Procedures (Signed)
EEG NUMBER:  REFERRING PHYSICIAN:  Eduard Clos, MD  HISTORY:  A 65 year old female with episode of altered awareness.  MEDICATIONS:  Tenormin, Rocephin, Parafon, folic acid, Haldol, Apresoline, Zestril, Ativan, K-Dur, Lyrica, Risperdal, Zoloft, Zofran, Vicodin.  CONDITIONS OF RECORDING:  This is a 16-channel EEG carried out with the patient in the awake, drowsy, and asleep states.  DESCRIPTION:  The waking background activity consists of a low-voltage symmetrical fairly well-organized 9 Hz alpha activity seen from the parieto-occipital and posterotemporal regions.  Low-voltage fast activity poorly organized was seen anteriorly and at times superimposed on more posterior rhythms.  A mixture of theta and alpha rhythm was seen from the central and temporal regions.  The patient drowses with slowing to irregular low-voltage theta and beta activity.  The patient goes into a light sleep with symmetrical sleep spindles and vertex with a sharp activity and irregular slow activity.  Hypoventilation and intermittent photic stimulation were not performed.  IMPRESSION:  This is a normal electroencephalogram.          ______________________________ Thana Farr, MD    ZO:[id number] D:  08/06/2011 19:04:59  T:  08/06/2011 22:26:40  Job #:  [id number]

## 2011-08-07 LAB — GLUCOSE, CAPILLARY
Glucose-Capillary: 92 mg/dL (ref 70–99)
Glucose-Capillary: 105 mg/dL — ABNORMAL HIGH (ref 70–99)

## 2011-08-07 LAB — BASIC METABOLIC PANEL
CO2: 26 mEq/L (ref 19–32)
Chloride: 102 mEq/L (ref 96–112)
GFR calc non Af Amer: >60 mL/min (ref >60)
Glucose, Bld: 83 mg/dL (ref 70–99)
Potassium: 3.9 mEq/L (ref 3.5–5.1)
Sodium: 136 mEq/L (ref 135–145)

## 2011-08-08 LAB — CBC
MCV: 92.3 fL (ref 78.0–100.0)
Platelets: 238 10*3/uL (ref 150–400)
RBC: 4.4 MIL/uL (ref 3.87–5.11)
WBC: 9 10*3/uL (ref 4.0–10.5)

## 2011-08-08 LAB — BASIC METABOLIC PANEL
CO2: 24 mEq/L (ref 19–32)
Chloride: 101 mEq/L (ref 96–112)
Creatinine, Ser: <0.47 mg/dL — ABNORMAL LOW (ref 0.50–1.10)

## 2011-08-08 LAB — GLUCOSE, CAPILLARY: Glucose-Capillary: 109 mg/dL — ABNORMAL HIGH (ref 70–99)

## 2011-08-08 LAB — GIARDIA/CRYPTOSPORIDIUM SCREEN(EIA): Cryptosporidium Screen (EIA): NEGATIVE

## 2011-08-08 LAB — CLOSTRIDIUM DIFFICILE BY PCR: Toxigenic C. Difficile by PCR: NEGATIVE

## 2011-08-08 NOTE — Consult Note (Signed)
  NAMESHAI, RASMUSSEN              ACCOUNT NO.:  [id number]  MEDICAL RECORD NO.:  [id number]  LOCATION:  5529                         FACILITY:  [facility]  PHYSICIAN:  Eulogio Ditch, MD DATE OF BIRTH:  [birth year]  DATE OF CONSULTATION:  08/06/2011 DATE OF DISCHARGE:                                CONSULTATION   REASON FOR CONSULTATION:  Altered mental status agitation.  HISTORY OF PRESENT ILLNESS:  A 65 year old female who was found confused and hardly arousable in her apartment.  The patient was brought to the hospital.  CT of the head is negative.  UA shows possible UTI.  The patient's urine drug screen is positive for amphetamines, opioids, and marijuana.  The patient is still confused, unable to provide a logical history.  She told me she do not know where she is.  She told me, I am in the room and I told her you are in a room in the hospital.  On asking about the past psych history, the patient told me that she is seeing Dr. Lorette Ang and he is giving antidepressant, but was unable to tell the details about her medications.  The patient's son had reported that the patient is using a lot of marijuana and meth and has been expanding it radically lately.  PAST MEDICAL HISTORY:  History of COPD, hypertension, previous history of TIA, CVA, osteoporosis, spinal fusion, cervical fusion.  CURRENT PSYCH MEDICATIONS: 1. Ativan 1 mg p.o. q.4 h. 2. Trazodone 100 mg three tablets at bedtime. 3. Zoloft 200 mg daily. 4. Lyrica 100 mg three times daily.  ALLERGIES:  The patient is allergic to ASPIRIN, NSAIDS, METHOCARBAMOL and PREDNISONE.  SOCIAL HISTORY:  The patient lives by herself.  On asking about drug abuse, she denied.  She told me she used marijuana 2 months ago.  MENTAL STATUS EXAM:  The patient is still confused, unable to provide logical history.  Denied hearing voices.  Denied suicidal or homicidal ideation.  She is alert and awake, but not oriented to time, place,  and person.  Memory immediate, recent remote poor.  Attention and concentration poor.  Abstraction ability poor.  Insight and judgment poor.  DIAGNOSES:  AXIS I:  Delirium not otherwise specified, polysubstance abuse/dependence. AXIS II:  Deferred. AXIS III:  See medical notes. AXIS IV:  Substance abuse. AXIS V:  40.  RECOMMENDATIONS: 1. At this time, I will start the patient on Risperdal 0.5 mg twice a     day to control her agitation, irritability, and lability. 2. I will tell clinical social worker to speak with her son to get     more information. 3. I will follow up on this patient.  Thanks for involving me in taking care of this patient.     Eulogio Ditch, MD     SA/MEDQ  D:  08/06/2011  T:  08/06/2011  Job:  [id number]  Electronically Signed by Eulogio Ditch  on 08/08/2011 08:57:09 AM

## 2011-08-09 DIAGNOSIS — F39 Unspecified mood [affective] disorder: Secondary | ICD-10-CM

## 2011-08-09 LAB — GLUCOSE, CAPILLARY: Glucose-Capillary: 102 mg/dL — ABNORMAL HIGH (ref 70–99)

## 2011-08-10 ENCOUNTER — Inpatient Hospital Stay (HOSPITAL_COMMUNITY)
Admission: AD | Admit: 2011-08-10 | Discharge: 2011-08-14 | DRG: 897 | Disposition: A | Discharge status: Home / Self Care | Payer: Medicare Other | Source: Ambulatory Visit | Attending: Psychiatry | Admitting: Psychiatry

## 2011-08-10 DIAGNOSIS — I10 Essential (primary) hypertension: Secondary | ICD-10-CM

## 2011-08-10 DIAGNOSIS — F1994 Other psychoactive substance use, unspecified with psychoactive substance-induced mood disorder: Secondary | ICD-10-CM

## 2011-08-10 DIAGNOSIS — R197 Diarrhea, unspecified: Secondary | ICD-10-CM

## 2011-08-10 DIAGNOSIS — F1910 Other psychoactive substance abuse, uncomplicated: Secondary | ICD-10-CM

## 2011-08-10 DIAGNOSIS — B379 Candidiasis, unspecified: Secondary | ICD-10-CM

## 2011-08-10 DIAGNOSIS — Z6379 Other stressful life events affecting family and household: Secondary | ICD-10-CM

## 2011-08-10 DIAGNOSIS — F1320 Sedative, hypnotic or anxiolytic dependence, uncomplicated: Principal | ICD-10-CM

## 2011-08-10 DIAGNOSIS — R404 Transient alteration of awareness: Secondary | ICD-10-CM

## 2011-08-10 DIAGNOSIS — J438 Other emphysema: Secondary | ICD-10-CM

## 2011-08-10 DIAGNOSIS — N390 Urinary tract infection, site not specified: Secondary | ICD-10-CM

## 2011-08-10 DIAGNOSIS — E871 Hypo-osmolality and hyponatremia: Secondary | ICD-10-CM

## 2011-08-10 DIAGNOSIS — F1210 Cannabis abuse, uncomplicated: Secondary | ICD-10-CM

## 2011-08-10 LAB — URINE CULTURE: Culture  Setup Time: 201208072226

## 2011-08-10 NOTE — Discharge Summary (Addendum)
  NAMEVESNA, [name]              ACCOUNT NO.:  [id number]  MEDICAL RECORD NO.:  [id number]  LOCATION:                               FACILITY:  [facility]  PHYSICIAN:  Andreas Blower, MD       DATE OF BIRTH:  15-Dec-1946  DATE OF ADMISSION:  08/09/2011 DATE OF DISCHARGE:                        DISCHARGE SUMMARY - REFERRING   ADDENDUM:  PRIMARY CARE PHYSICIAN:  [name], MD  DISCHARGE DIAGNOSES: 1. Delirium, etiology unclear.  Resolved. 2. Urinary tract infection. 3. Diarrhea resolved. 4. Hyponatremia resolved. 5. Candidiasis. 6. Tobacco use. 7. Hypertension. 8. History of emphysema.  DISCHARGE MEDICATIONS: 1. Haldol 2 mg 3 times a day as needed. 2. Nicotine patch 14 mg daily as needed for smoking cessation. 3. Risperidone 0.5 mg p.o. twice daily. 4. Keflex 500 mg p.o. twice daily for 2 more days. 5. Miconazole cream 2%, 4 times day for 7 days. 6. Acetaminophen 325 mg 1-2 tablets every 4 hours as needed. 7. Atenolol 50 mg p.o. twice daily. 8. Chlorzoxazone 500 mg p.o. 4 times a day. 9. Combivent 2 puffs 4 times a day as needed for breathing. 10.Hydrocodone/APAP 5/325 every 4 hours as needed for pain. 11.Lisinopril 20 mg p.o. daily 12.Lorazepam 1 mg every 4 hours as needed. 13.Pregabalin 100 mg p.o. 3 times a day. 14.Promethazine 50 mg every 6 hours as needed for nausea. 15.Sertraline 200 mg p.o. q.a.m. 16.Trazodone 450 mg p.o. daily at bedtime.  HOSPITAL COURSE:  Please refer to discharge summary dictated on August 09, 2011 for more details.  Subsequently since that discharge summary was dictated, the patient requested nicotine patch which is to be added to her discharge medications.  Otherwise discharge summary unchanged.  Time spent on discharge, talking to the patient and coordinating care was 35 minutes.     Andreas Blower, MD     SR/MEDQ  D:  08/10/2011  T:  08/10/2011  Job:  [id number]  cc:   [name], MD  Electronically Signed by Wardell Heath Dayon Witt   on 08/22/2011 08:56:20 PM

## 2011-08-11 DIAGNOSIS — F1994 Other psychoactive substance use, unspecified with psychoactive substance-induced mood disorder: Secondary | ICD-10-CM

## 2011-08-11 LAB — STOOL CULTURE
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
## 2011-12-03 HISTORY — PX: TRANSTHORACIC ECHOCARDIOGRAM: SHX275

## 2012-06-04 ENCOUNTER — Encounter (INDEPENDENT_AMBULATORY_CARE_PROVIDER_SITE_OTHER): Payer: Medicare Other | Admitting: Ophthalmology

## 2012-06-17 ENCOUNTER — Encounter (INDEPENDENT_AMBULATORY_CARE_PROVIDER_SITE_OTHER): Payer: Medicare Other | Admitting: Ophthalmology

## 2012-07-01 ENCOUNTER — Encounter (INDEPENDENT_AMBULATORY_CARE_PROVIDER_SITE_OTHER): Payer: Medicare Other | Admitting: Ophthalmology

## 2012-07-01 DIAGNOSIS — I10 Essential (primary) hypertension: Secondary | ICD-10-CM

## 2012-07-01 DIAGNOSIS — H2510 Age-related nuclear cataract, unspecified eye: Secondary | ICD-10-CM

## 2012-07-01 DIAGNOSIS — H43819 Vitreous degeneration, unspecified eye: Secondary | ICD-10-CM

## 2012-07-01 DIAGNOSIS — H401390 Pigmentary glaucoma, unspecified eye, stage unspecified: Secondary | ICD-10-CM

## 2012-07-01 DIAGNOSIS — H35039 Hypertensive retinopathy, unspecified eye: Secondary | ICD-10-CM

## 2012-11-02 ENCOUNTER — Ambulatory Visit: Payer: Medicare Other | Admitting: Gynecology

## 2012-11-18 ENCOUNTER — Other Ambulatory Visit (HOSPITAL_COMMUNITY): Payer: Self-pay | Admitting: Internal Medicine

## 2012-11-18 DIAGNOSIS — R1030 Lower abdominal pain, unspecified: Secondary | ICD-10-CM

## 2012-11-20 ENCOUNTER — Ambulatory Visit (HOSPITAL_COMMUNITY)
Admission: RE | Admit: 2012-11-20 | Discharge: 2012-11-20 | Disposition: A | Discharge status: Home / Self Care | Payer: Medicare Other | Source: Ambulatory Visit | Attending: Internal Medicine | Admitting: Internal Medicine

## 2012-11-20 DIAGNOSIS — R109 Unspecified abdominal pain: Secondary | ICD-10-CM | POA: Insufficient documentation

## 2012-11-20 DIAGNOSIS — R1030 Lower abdominal pain, unspecified: Secondary | ICD-10-CM

## 2012-12-04 ENCOUNTER — Ambulatory Visit: Payer: Medicare Other | Admitting: Gynecology

## 2012-12-08 ENCOUNTER — Ambulatory Visit (INDEPENDENT_AMBULATORY_CARE_PROVIDER_SITE_OTHER): Payer: Medicare Other | Admitting: Gynecology

## 2012-12-08 ENCOUNTER — Encounter: Payer: Self-pay | Admitting: Gynecology

## 2012-12-08 VITALS — BP 128/78 | Ht 61.0 in | Wt 154.0 lb

## 2012-12-08 DIAGNOSIS — F419 Anxiety disorder, unspecified: Secondary | ICD-10-CM | POA: Insufficient documentation

## 2012-12-08 DIAGNOSIS — F17200 Nicotine dependence, unspecified, uncomplicated: Secondary | ICD-10-CM | POA: Insufficient documentation

## 2012-12-08 DIAGNOSIS — F329 Major depressive disorder, single episode, unspecified: Secondary | ICD-10-CM | POA: Insufficient documentation

## 2012-12-08 DIAGNOSIS — N950 Postmenopausal bleeding: Secondary | ICD-10-CM

## 2012-12-08 DIAGNOSIS — I10 Essential (primary) hypertension: Secondary | ICD-10-CM | POA: Insufficient documentation

## 2012-12-08 DIAGNOSIS — N9089 Other specified noninflammatory disorders of vulva and perineum: Secondary | ICD-10-CM

## 2012-12-08 NOTE — Progress Notes (Signed)
Patient is a 66 year old who presented to the office today stating that for the past 8 months she has had vaginal bleeding. She's also been complaining of hot flashes during that time. Patient stated that when she was in her 30s she had a TVH for menorrhagia. Several years later she had a laparotomy for a benign right adnexal mass resulting in a salpingo-oophorectomy. Patient states that sometimes she notices a bloody discharge on her panty sometimes brown sometimes a yellowish in appearance. Patient states she has not been sexually active in 20 years.  Patient states her mother had colon cancer at the age of 54. Patient with no prior colonoscopy or mammogram. Patient stated she has never had abnormal Pap smears in the past. She denied any GU or GI complaints. Her primary physician is Dr. Dwana Melena in [place], [place]. [place]. She brought recent labs that were drawn in his office which included CBC, comprehensive metabolic panel which were normal. She had ordered a CT of the abdomen and pelvis because of low abdominal discomfort that she had been experiencing. CT report as follows:  No acute abnormality identified in the abdomen and pelvis. Patchy  air space disease in the right lower lobe with minimal right  pleural effusion suspicious for pneumonia. Follow-up after  treatment recommended to ensure resolution.  Patient does have history of COPD and she smokes approximately one pack a cigarettes every couple of days. Pelvic exam as follows:  Physical Exam  Genitourinary:      Speculum examination did not demonstrate any vaginal lesions vaginal cuff was intact. Perineum intact no perirectal lesions noted externally. The right labia majora raised hyperemic lesion was biopsied. High suspicion for vulvar cancer. The area had been cleansed with Betadine solution and 1% lidocaine was used for local anesthetic and a keypunch biopsy was obtained. Silver nitrate and Monsel solution was used for hemostasis.  I we'll for a copy of this office note to her primary physician Dr. Margo Aye so that a continuous the patient to get her screening colonoscopy and mammogram which are long-overdue. We'll wait for pathology report and plan a course of management. She states that she's been complaining of hot flashes and was informed that her TSH was low and that Dr. Margo Aye is planning on repeating her thyroid function tests next week in his office.

## 2012-12-08 NOTE — Addendum Note (Signed)
Addended by: Bertram Savin A on: 12/08/2012 03:54 PM   Modules accepted: Orders

## 2012-12-15 ENCOUNTER — Telehealth: Payer: Self-pay | Admitting: *Deleted

## 2012-12-15 NOTE — Telephone Encounter (Signed)
Dr Lily Peer asked me to call the patient to have her make an appt to discuss the results of the her vulva biopsy. The patient stated that Dr Lily Peer promised her to tell her over the phone and would discuss further at her 2 week check of the area. 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 She would not make an apt before. I advised the patient to call us if she needed [place] before. KW

## 2012-12-22 ENCOUNTER — Telehealth: Payer: Self-pay | Admitting: Gynecology

## 2012-12-22 NOTE — Telephone Encounter (Signed)
Recently I placed a phone call to the GYN oncologist at [facility] Dr. 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 Microscopic Comment There are focal changes suspicious for early microscopic invasion. Dr. Colonel Bald has seen this case in consultation with agreement. (RAH:gt, 12/10/12)  She had recommended that I contact pathologist to determine if the depth of invasion was less than 1 mm and I spoke with Dr.Kish who reviewed the slides and added the following addendum  ADDITIONAL INFORMATION: Deeper tissue levels were examined. No definitive invasive squamous cell carcinoma is identified. The case was discussed with Dr. Lily Peer on 12/16/2012. (JBK:gt, 12/16/12) Pecola Leisure MD Pathologist, Electronic Signature ( Signed 12/16/2012)  Dr. Nelly Rout had recommended a wide local excision since it was less than 1 mm in depth. I have contacted the patient and informed her of the findings and she scheduled to see me January 2 for preoperative consultation.

## 2012-12-28 ENCOUNTER — Telehealth: Payer: Self-pay | Admitting: Gynecology

## 2012-12-28 NOTE — Telephone Encounter (Signed)
I called patient to speak with her about schedule WLE of Right Labia Majora per Dr. Glenetta Hew.  Patient mentioned to me that she now has another place on the left labia that has started just like the right side did.  She said it is a "peanut" sized know and it has been bleeding some.  I told her that I felt sure Dr. Glenetta Hew would want to examine it on Thursday 12/31/12 when she comes for follow-up and he could decide then if surgery needed for that as well.  She seemed to think it might be needed.  I will note this on her appointment notes.

## 2012-12-28 NOTE — Telephone Encounter (Signed)
Patient mentioned she has emphysema and really bad arthritis.  I asked her to go ahead and see about getting medical clearance from primary care MD.  She called back this afternoon and said that primary care MD said no problem and asked [place] to fax them something to fill out to indicate this.  I will call them tomorrow and let them know a note written on RX pad will be fine.

## 2012-12-28 NOTE — Telephone Encounter (Signed)
Thank you. JF

## 2012-12-29 ENCOUNTER — Encounter: Payer: Self-pay | Admitting: Gynecology

## 2012-12-29 ENCOUNTER — Telehealth: Payer: Self-pay | Admitting: Gynecology

## 2012-12-29 NOTE — Telephone Encounter (Signed)
No answer. Will try again later. 

## 2012-12-29 NOTE — Telephone Encounter (Signed)
Patient called to confirm that she was able to work out transportation for her surgery on 01/07/12.    Patient also asked if Dr. Glenetta Hew could call her something in for hotflashes.  Said she is miserable.  She said she mentioned to him at office visit but then got side tracked and did not get Rx.  I asked her was she on any hormones currently and she said "no, I cannot take hormones".  Patient understands Dr. Glenetta Hew out of office and she has office visit Thursday with him and she may have to wait until that day when he returns.

## 2012-12-29 NOTE — Telephone Encounter (Signed)
Patient is a high risk for DVT being post menopausal and smoker. To help with her vasomotor symptoms she may need to stop the zoloft and switch her to Effexor 37.5 mg to take one PO BID which will help her with the vasomotor symptoms and depression. Please call in #30 with 5 refills.

## 2012-12-29 NOTE — Telephone Encounter (Signed)
I faxed letter to her primary care MD this morning (letter in system) and asked him to send [place] something in writing clearing her for surgery.

## 2012-12-31 ENCOUNTER — Encounter (HOSPITAL_COMMUNITY): Payer: Self-pay | Admitting: Pharmacist

## 2012-12-31 ENCOUNTER — Encounter (HOSPITAL_COMMUNITY)
Admission: RE | Admit: 2012-12-31 | Discharge: 2012-12-31 | Disposition: A | Discharge status: Home / Self Care | Payer: Medicare Other | Source: Ambulatory Visit | Attending: Gynecology | Admitting: Gynecology

## 2012-12-31 ENCOUNTER — Telehealth: Payer: Self-pay | Admitting: *Deleted

## 2012-12-31 ENCOUNTER — Encounter (HOSPITAL_COMMUNITY): Payer: Self-pay

## 2012-12-31 ENCOUNTER — Telehealth: Payer: Self-pay | Admitting: Gynecology

## 2012-12-31 ENCOUNTER — Encounter: Payer: Self-pay | Admitting: Gynecology

## 2012-12-31 ENCOUNTER — Ambulatory Visit (INDEPENDENT_AMBULATORY_CARE_PROVIDER_SITE_OTHER): Payer: Medicare Other | Admitting: Gynecology

## 2012-12-31 ENCOUNTER — Other Ambulatory Visit: Payer: Self-pay

## 2012-12-31 VITALS — BP 174/2

## 2012-12-31 DIAGNOSIS — Z01818 Encounter for other preprocedural examination: Secondary | ICD-10-CM | POA: Insufficient documentation

## 2012-12-31 DIAGNOSIS — J4489 Other specified chronic obstructive pulmonary disease: Secondary | ICD-10-CM

## 2012-12-31 DIAGNOSIS — J439 Emphysema, unspecified: Secondary | ICD-10-CM

## 2012-12-31 DIAGNOSIS — Z8673 Personal history of transient ischemic attack (TIA), and cerebral infarction without residual deficits: Secondary | ICD-10-CM | POA: Insufficient documentation

## 2012-12-31 DIAGNOSIS — D071 Carcinoma in situ of vulva: Secondary | ICD-10-CM | POA: Insufficient documentation

## 2012-12-31 DIAGNOSIS — J449 Chronic obstructive pulmonary disease, unspecified: Secondary | ICD-10-CM

## 2012-12-31 DIAGNOSIS — J438 Other emphysema: Secondary | ICD-10-CM

## 2012-12-31 HISTORY — DX: Chronic obstructive pulmonary disease, unspecified: J44.9

## 2012-12-31 HISTORY — DX: Cerebral infarction, unspecified: I63.9

## 2012-12-31 HISTORY — DX: Other specified postprocedural states: Z98.890

## 2012-12-31 HISTORY — DX: Other specified postprocedural states: R11.2

## 2012-12-31 HISTORY — DX: Nicotine dependence, unspecified, uncomplicated: F17.200

## 2012-12-31 LAB — BASIC METABOLIC PANEL
BUN: 6 mg/dL (ref 6–23)
Calcium: 9.1 mg/dL (ref 8.4–10.5)
Creatinine, Ser: 0.65 mg/dL (ref 0.50–1.10)
GFR calc Af Amer: >90 mL/min (ref >90)
GFR calc non Af Amer: >90 mL/min (ref >90)
Glucose, Bld: 74 mg/dL (ref 70–99)

## 2012-12-31 LAB — CBC
MCH: 31.6 pg (ref 26.0–34.0)
MCHC: 33.6 g/dL (ref 30.0–36.0)
Platelets: 195 10*3/uL (ref 150–400)
RDW: 13.9 % (ref 11.5–15.5)

## 2012-12-31 MED ORDER — LIDOCAINE HCL 2 % EX GEL: CUTANEOUS | Status: DC | PRN

## 2012-12-31 MED ORDER — VENLAFAXINE HCL 37.5 MG PO TABS: 37.5000 mg | ORAL_TABLET | Freq: Two times a day (BID) | ORAL | Status: DC

## 2012-12-31 NOTE — Progress Notes (Signed)
12/08/2012 3:22 PM Signed  Patient is a 67 year old who presented to the office today stating that for the past 8 months she has had vaginal bleeding. She's also been complaining of hot flashes during that time. Patient stated that when she was in her 30s she had a TVH for menorrhagia. Several years later she had a laparotomy for a benign right adnexal mass resulting in a salpingo-oophorectomy. Patient states that sometimes she notices a bloody discharge on her panty sometimes brown sometimes a yellowish in appearance. Patient states she has not been sexually active in 20 years.   Patient states her mother had colon cancer at the age of 23. Patient with no prior colonoscopy or mammogram. Patient stated she has never had abnormal Pap smears in the past. She denied any GU or GI complaints. Her primary physician is Dr. Dwana Melena in [place], [place]. [place]. She brought recent labs that were drawn in his office which included CBC, comprehensive metabolic panel which were normal. He had ordered a CT of the abdomen and pelvis because of low abdominal discomfort that she had been experiencing. CT report as follows:   No acute abnormality identified in the abdomen and pelvis. Patchy  air space disease in the right lower lobe with minimal right  pleural effusion suspicious for pneumonia. Follow-up after  treatment recommended to ensure resolution.  Patient does have history of COPD and she smokes approximately one pack a cigarettes every couple of days. Pelvic exam as follows: :   Right labia majora with multifocal lesions seen with space eating between each hyperemic raised tender areas measuring an average of 1 cm in size fleshy like appearance.  Speculum examination did not demonstrate any vaginal lesions vaginal cuff was intact. Perineum intact no perirectal lesions noted externally. The right labia majora raised hyperemic lesion was biopsied. High suspicion for vulvar cancer. One of the lesions  had been  cleansed with Betadine solution and 1% lidocaine was used for local anesthetic and a keypunch biopsy was obtained.She states that she's been complaining of hot flashes and was informed that her TSH was low and that Dr. Margo Aye is planning on repeating her thyroid function tests next week in his office. Patient is long overdue for her colonoscopy and mammography.  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 Microscopic Comment There are focal changes suspicious for early microscopic invasion. Dr. Colonel Bald has seen this case in consultation with agreement.  Recently I placed a phone call to the GYN oncologist at [facility] Dr. Laurette Schimke in reference to patient's pathology report from her keypunch biopsy of the vulva and she had recommended that I contacted pathologist to determine the depth of invasion was less than 1 mm and I spoke with Dr. Colonel Bald who reviewed the slides it had of the following addendum:  ADDITIONAL INFORMATION:  Deeper tissue levels were examined. No definitive invasive squamous cell carcinoma is identified.  The case was discussed with Dr. Lily Peer on 12/16/2012. (JBK:gt, 12/16/12)  Pecola Leisure MD  Pathologist, Electronic Signature  ( Signed 12/16/2012)  Patient presented to the office today to discuss the results and plan of action. On examination it appears that the lesions or multifocal on the right labia majora with spacing between the lesions. I'm going to refer her to the GYN oncologist because she is going to need a very  wide and long excision possibly incorporating her entire right labia majora. Patient will also need to be done under spinal anesthesia and will need to have medical clearance because she has history of COPD as well as emphysema and history of stroke. She is a chain  smoker. I  have given her  a prescription 2% lidocaine to apply when necessary for discomfort until she sees the GYN oncologist. The concerns I have is this being  multifocal that only a small isolated keypunch biopsy demonstrated the above results and it may turn out that she may have vulvar cancer and this is the reason why she is being referred to the GYN oncologist. She also had been complaining of vasomotor symptoms and was switched to Effexor 37.5 mg twice a day and she was to discontinue the Zoloft. She also has history of depression and hypertension.

## 2012-12-31 NOTE — Patient Instructions (Addendum)
   Your procedure is scheduled on: Wednesday, Jan 8th  Enter through the [employer] of [facility] at: 7am Pick up the phone at the desk and dial [phone] and inform us of your arrival.  Please call this number if you have any problems the morning of surgery: [phone]  Remember: Do not eat food after midnight: Tuesday Do not drink clear liquids after: Midnight Tuesday Take these medicines the morning of surgery with a SIP OF WATER: xanax, tudorza pressair inhaler, lisinopril, zoloft  Do not wear jewelry, make-up, or FINGER nail polish No metal in your hair or on your body. Do not wear lotions, powders, perfumes. You may wear deodorant.  Please use your CHG wash as directed prior to surgery.  Do not shave anywhere for at least 12 hours prior to first CHG shower.  Do not bring valuables to the hospital. Contacts, dentures or bridgework may not be worn into surgery.  Patients discharged on the day of surgery will not be allowed to drive home.  Home with son [name].

## 2012-12-31 NOTE — Telephone Encounter (Signed)
rx sent to pharmacy

## 2012-12-31 NOTE — Pre-Procedure Instructions (Signed)
Dr Cristela Blue reviewed patient's history and EKG.  Ok to see patient DOS.

## 2012-12-31 NOTE — Telephone Encounter (Signed)
Message copied by Aura Camps on Thu Dec 31, 2012  2:34 PM ------      Message from: Ok Edwards      Created: Thu Dec 31, 2012  2:20 PM       Patient is a high risk for DVT being post menopausal and smoker. To help with her vasomotor symptoms she may need to stop the zoloft and switch her to Effexor 37.5 mg to take one PO BID which will help her with the vasomotor symptoms and depression. Please call in #30 with 5 refills.

## 2012-12-31 NOTE — Telephone Encounter (Signed)
I left a message on voice mail at Gyn-Onc office that Dr. Glenetta Hew requesting appt for patient due to Multifocal VINIII throughout right labia per Dr. Glenetta Hew.  Patient with history of COPD, emphysema and stroke. May need overnight admission.  I will await their call.  Also, cancelled surgery scheduled at [facility] for 01/06/13.

## 2013-01-01 ENCOUNTER — Telehealth: Payer: Self-pay | Admitting: Gynecology

## 2013-01-01 NOTE — Telephone Encounter (Signed)
Patient came for office visit yesterday and discussed this with Dr. Glenetta Hew and he prescribed the Effexor and todl her to discontinue Zoloft.  See office note.

## 2013-01-01 NOTE — Telephone Encounter (Signed)
[name] called with appointment for patient for January 21,2014 at 10:00am with Dr. Laurette Schimke.  I tried to call patient to inform her but no answer so I will continue to try to reach her.

## 2013-01-04 NOTE — Telephone Encounter (Signed)
Patient informed.  She asked me to forward a copy of her office visit to her primary care MD.  Dr. Dwana Melena [phone] phone/ [phone] fax and I have done that.

## 2013-01-06 ENCOUNTER — Ambulatory Visit (HOSPITAL_BASED_OUTPATIENT_CLINIC_OR_DEPARTMENT_OTHER): Admission: RE | Admit: 2013-01-06 | Payer: Medicare Other | Source: Ambulatory Visit | Admitting: Gynecology

## 2013-01-06 ENCOUNTER — Encounter (HOSPITAL_BASED_OUTPATIENT_CLINIC_OR_DEPARTMENT_OTHER): Admission: RE | Payer: Self-pay | Source: Ambulatory Visit

## 2013-01-06 SURGERY — VULVAR LESION
Anesthesia: Choice

## 2013-01-14 ENCOUNTER — Telehealth: Payer: Self-pay

## 2013-01-14 NOTE — Telephone Encounter (Signed)
Patient called to confirm her appointment with Dr. Laurette Schimke as she lost the paper she wrote it one. It is 01/19/13 at 10:30am and I confirmed this with patient.  Patient wanted to ask me a question. She wanted to know if with "all I have going on with me..and it is a lot" is is normal to be sleeping more than usual.  I told her that she does indeed have a lot of health concerns but that if she has noticed a significant change in sleep that she should follow-up with primary care MD to assess.

## 2013-01-19 ENCOUNTER — Ambulatory Visit: Payer: Medicare Other | Attending: Gynecologic Oncology | Admitting: Gynecologic Oncology

## 2013-01-19 ENCOUNTER — Encounter: Payer: Self-pay | Admitting: Gynecologic Oncology

## 2013-01-19 VITALS — BP 120/80 | HR 64 | Temp 98.8°F | Resp 18 | Ht 65.59 in | Wt 156.0 lb

## 2013-01-19 DIAGNOSIS — D071 Carcinoma in situ of vulva: Secondary | ICD-10-CM | POA: Insufficient documentation

## 2013-01-19 DIAGNOSIS — Z8673 Personal history of transient ischemic attack (TIA), and cerebral infarction without residual deficits: Secondary | ICD-10-CM | POA: Insufficient documentation

## 2013-01-19 DIAGNOSIS — I10 Essential (primary) hypertension: Secondary | ICD-10-CM | POA: Insufficient documentation

## 2013-01-19 DIAGNOSIS — Z79899 Other long term (current) drug therapy: Secondary | ICD-10-CM | POA: Insufficient documentation

## 2013-01-19 DIAGNOSIS — J438 Other emphysema: Secondary | ICD-10-CM | POA: Insufficient documentation

## 2013-01-19 NOTE — Patient Instructions (Signed)
Recommend wide local excision of the right vulva lesions.  When oncology office will contact you regarding the date of surgery  Anesthesia preop evaluation will be scheduled.  Thank you very much [name] for allowing me to provide care for you today.  I appreciate your confidence in choosing our Gynecologic Oncology team.  If you have any questions about your visit today please call our office and we will get back to you as soon as possible.  [name]. Usiel Astarita MD., PhD Gynecologic Oncology

## 2013-01-19 NOTE — Progress Notes (Signed)
Consult Note: Gyn-Onc  Consult was requested by Dr. Phill Mutter the evaluation of [name] 67 y.o. female  CC:  Chief Complaint  Patient presents with  . VIN III    New Consult    HPI: This is a 67 year old gravida 1 para 1 who initially presented to Dr. Lily Peer with complaints of vaginal bleeding and hot flashes. On evaluation she was noted to have a vulvar lesion and biopsies were collected. The specimen dated 12/16/2012 was consistent with at least high-grade vulvar intraepithelial neoplasia. The patient reports that a mass is been present on her vulva for approximately 2 years. Was initially treated with a cream however bleeding did not begin until October of 2013. Patient states it she's had itching of the vulva but approximately 8 months. She denies any adenopathy she reports increased appetite and 36 pound weight gain over the last 6 months  Current Meds:  Outpatient Encounter Prescriptions as of 01/19/2013  Medication Sig Dispense Refill  . Aclidinium Bromide (TUDORZA PRESSAIR) 400 MCG/ACT AEPB Inhale 1 puff into the lungs 2 (two) times daily.      [name] ALPRAZolam (XANAX) 1 MG tablet Take 1 mg by mouth 4 (four) times daily.       . diphenhydrAMINE (BENADRYL) 50 MG capsule Take 50 mg by mouth daily as needed. For itching      . HYDROcodone-acetaminophen (LORTAB) 7.5-500 MG per tablet Take 1 tablet by mouth every 6 (six) hours as needed.      . lidocaine (XYLOCAINE JELLY) 2 % jelly Apply topically as needed.  30 mL  2  . lisinopril (PRINIVIL,ZESTRIL) 20 MG tablet Take 20 mg by mouth daily.      . sertraline (ZOLOFT) 100 MG tablet Take 200 mg by mouth daily.       . trazodone (DESYREL) 300 MG tablet Take 300 mg by mouth at bedtime.      [name] venlafaxine (EFFEXOR) 37.5 MG tablet Take 1 tablet (37.5 mg total) by mouth 2 (two) times daily.  30 tablet  5    Allergy:  Allergies  Allergen Reactions  . Nsaids     GI Upset  . Aspirin Other (See Comments)    Gi symptoms. Does NOT  take ibuprofen or other NSAIDS    Social Hx:   History   Social History  . Marital Status: Divorced    Spouse Name: N/A    Number of Children: N/A  . Years of Education: N/A   Occupational History  . Not on file.   Social History Main Topics  . Smoking status: Current Every Day Smoker -- 0.5 packs/day for 35 years    Types: Cigarettes  . Smokeless tobacco: Never Used     Comment: smoking cessation information given  . Alcohol Use: No  . Drug Use: No  . Sexually Active: No   Other Topics Concern  . Not on file   Social History Narrative  . No narrative on file    Past Surgical Hx:  Past Surgical History  Procedure Date  . Oophorectomy     RIGHT  . Carpal tunnel release     BILATERAL  . Neck fusion   . Nasal sinus surgery   . Abdominal hysterectomy     TAH w/ ovary removal  . Lumbar fusion     L4-S1  . Cervical fusion     Neck  . Hernia repair     Past Medical Hx:  Past Medical History  Diagnosis Date  . H/O:  stroke 2012, 2000,     [place]  . Hypertension   . Anxiety   . PTSD (post-traumatic stress disorder)   . Depression   . PONV (postoperative nausea and vomiting)   . SVD (spontaneous vaginal delivery)     x 1  . COPD (chronic obstructive pulmonary disease)   . Asthma   . Smoker   . Osteoarthritis     knees, hips hands,   . Stroke     x 3 strokes Dr Andrey Campanile; Dr Marnee Spring Ezequiel Ganser,      Past Gynecological History:  Gravida 1 para 1 menarche at the age of 9 regular menses until vaginal hysterectomy for menorrhagia in her 30s No LMP recorded. Patient has had a hysterectomy.  Family Hx:  Family History  Problem Relation Age of Onset  . Cancer Mother     COLON  . Heart disease Father   . Diabetes Maternal Aunt   . Cancer Maternal Grandfather     KIDNEY   . Diabetes Maternal Grandfather   . Hypertension Maternal Grandfather   . Heart disease Maternal Grandfather     Review of Systems:  Constitutional  Feels well,  denies fever or chills  reports increased appetite 36 pound weight gain Cardiovascular  No chest pain, shortness of breath, or edema  Pulmonary  No cough ports wheezing from her long-standing emphysema has decreased her tobacco use from 1-1/2 packs per day to 10 cigarettes per day Gastro Intestinal  No nausea, vomitting, or diarrhoea. No bright red blood per rectum, no abdominal pain, change in bowel movement, or constipation.  Genito Urinary  No frequency, urgency, dysuria, reports vaginal bleeding since October 2013  Musculo Skeletal  No myalgia, arthralgia, joint swelling or pain  Neurologic  No weakness, numbness, change in gait,  Psychology  No depression, anxiety, insomnia.   Vitals:  Blood pressure 120/80, pulse 64, temperature 98.8 F (37.1 C), temperature source Oral, resp. rate 18, height 5' 5.59" (1.666 m), weight 156 lb (70.761 kg).  Physical Exam: WD in NAD Neck  Supple NROM, without any enlargements.  Lymph Node Survey No cervical supraclavicular or inguinal adenopathy Cardiovascular  Pulse normal rate, regularity and rhythm. S1 and S2 normal.  Lungs  Clear to auscultation bilateraly, hyperinflation bilaterally  Skin  Very thick facial skin. Rate down of nails almost consistent with a fungal infection however the patient states that in there since childhood  Psychiatry  Alert and oriented to person, place, and time  Abdomen  Normoactive bowel sounds, abdomen soft, non-tender and thin no palpable masses Back No CVA tenderness Genito Urinary  Vulva/vagina: External genitalia is notable for lesions on the inner aspect of the right labia minora and majora. There is 3 lesions one the most inferior measuring 7 mm in the superior lesions measuring 3 and 4 mm. These are all within the 2 cm diameter. No kissing lesions are appreciated.  Vagina without any evidence of discharge or bleeding.   Adnexa: No palpable masses. Rectal  Good tone, no masses no cul de sac nodularity.  Extremities  Loss  of most of the nailbed of her upper fingers. No edema of the lower extremities no clubbing appreciated.  Assessment/Plan:  [name]  is a 67 y.o.  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  The recommendation is for wide local excision that will encompass all 3 lesions and will be able to definitively evaluate for microinvasive disease. The patient is aware of the risks and benefits of this  procedure infection bleeding and wound breakdown. Given her  substantive tobacco use she is aware that this risk is increased.  Given the significant emphysema I've asked for a preanesthesia evaluation so that anesthesia can be tailored her pulmonary function. We'll contact the patient shortly regarding the date of surgery.   Laurette Schimke, MD, PhD 01/19/2013, 4:49 PM

## 2013-02-09 ENCOUNTER — Institutional Professional Consult (permissible substitution): Payer: Medicare Other | Admitting: Internal Medicine

## 2013-02-10 ENCOUNTER — Encounter: Payer: Self-pay | Admitting: Internal Medicine

## 2013-02-10 ENCOUNTER — Institutional Professional Consult (permissible substitution): Payer: Medicare Other | Admitting: Internal Medicine

## 2013-02-10 ENCOUNTER — Ambulatory Visit (INDEPENDENT_AMBULATORY_CARE_PROVIDER_SITE_OTHER): Payer: Medicare Other | Admitting: Internal Medicine

## 2013-02-10 VITALS — BP 140/90 | HR 68 | Temp 97.6°F | Ht 66.0 in | Wt 159.0 lb

## 2013-02-10 DIAGNOSIS — I10 Essential (primary) hypertension: Secondary | ICD-10-CM

## 2013-02-10 DIAGNOSIS — F17200 Nicotine dependence, unspecified, uncomplicated: Secondary | ICD-10-CM

## 2013-02-10 DIAGNOSIS — J449 Chronic obstructive pulmonary disease, unspecified: Secondary | ICD-10-CM

## 2013-02-10 MED ORDER — AZITHROMYCIN 250 MG PO TABS: ORAL_TABLET | ORAL | Status: DC

## 2013-02-10 MED ORDER — BUDESONIDE-FORMOTEROL FUMARATE 160-4.5 MCG/ACT IN AERO: INHALATION_SPRAY | RESPIRATORY_TRACT | Status: DC

## 2013-02-10 MED ORDER — NEBIVOLOL HCL 10 MG PO TABS: 10.0000 mg | ORAL_TABLET | Freq: Every day | ORAL | Status: DC

## 2013-02-10 NOTE — Progress Notes (Signed)
  Subjective:    Patient ID: [name], female    DOB: Jan 25, 1946  MRN: [id number]  HPI  82 yowf active smoker referred 02/10/13 by Dr Nelly Rout for copd preop eval planning vulvectomy   02/10/2013 1st ov/ pulmonary eval on acei cc chronic x years cough and congestion better p tudorza and 3 x weekly neb alb with mucus typically white and thick in am but more yellow x 2-3 days  and more limited by back than breathing chronically.  No obvious daytime variabilty or assoc   cp or chest tightness, subjective wheeze overt sinus or hb symptoms. No unusual exp hx or h/o childhood pna/ asthma or premature birth to her knowledge.   Sleeping ok without nocturnal  or early am exacerbation  of respiratory  c/o's or need for noct saba. Also denies any obvious fluctuation of symptoms with weather or environmental changes or other aggravating or alleviating factors except as outlined above    Review of Systems  Constitutional: Positive for appetite change and unexpected weight change. Negative for fever.  HENT: Positive for ear pain and dental problem. Negative for nosebleeds, congestion, sore throat, rhinorrhea, sneezing, trouble swallowing, postnasal drip and sinus pressure.   Eyes: Negative for redness and itching.  Respiratory: Positive for cough and shortness of breath. Negative for chest tightness and wheezing.   Cardiovascular: Negative for palpitations and leg swelling.  Gastrointestinal: Positive for abdominal pain. Negative for nausea and vomiting.  Genitourinary: Negative for dysuria.  Musculoskeletal: Positive for joint swelling.  Skin: Negative for rash.  Neurological: Negative for headaches.  Hematological: Does not bruise/bleed easily.  Psychiatric/Behavioral: Positive for dysphoric mood. The patient is nervous/anxious.        Objective:   Physical Exam Wt Readings from Last 3 Encounters:  02/10/13 159 lb (72.122 kg)  01/19/13 156 lb (70.761 kg)  12/31/12 150 lb (68.04 kg)     Relatively thin wf > stated age with mild pseudowheeze  HEENT mild turbinate edema.  Oropharynx no thrush or excess pnd or cobblestoning.  No JVD or cervical adenopathy. Mild accessory muscle hypertrophy. Trachea midline, nl thryroid. Chest was hyperinflated by percussion with diminished breath sounds and moderate increased exp time without wheeze. Hoover sign positive at mid inspiration. Regular rate and rhythm without murmur gallop or rub or increase P2 or edema.  Abd: no hsm, nl excursion. Ext warm without cyanosis or clubbing.         Assessment & Plan:

## 2013-02-10 NOTE — Patient Instructions (Addendum)
zpak x 5 days For cough > mucinex dm 1200 mg every 12 hours Symbicort 160 Take 2 puffs first thing in am and then another 2 puffs about 12 hours later.  Stop lisinopril Start Bystolic 10 mg each am  The key is to stop smoking completely before smoking completely stops you - this is the most important aspect of your care!  Return on Feb 19 for spirometry

## 2013-02-11 DIAGNOSIS — I10 Essential (primary) hypertension: Secondary | ICD-10-CM | POA: Insufficient documentation

## 2013-02-11 NOTE — Assessment & Plan Note (Signed)
>   3 min I reviewed the Flethcher curve with patient that basically indicates  if you quit smoking when your best day FEV1 is still well preserved it is highly unlikely you will progress to severe disease and informed the patient there was no medication on the market that has proven to change the curve or the likelihood of progression.  Therefore stopping smoking and maintaining abstinence is the most important aspect of care, not choice of inhalers or for that matter, doctors.    In addition, discussed periop risk if continues to smoke preop

## 2013-02-11 NOTE — Assessment & Plan Note (Addendum)
Clinically moderate with mild flare of chronic cough ? Uri while on ACEI vs related to smoking (both discussed separately)  Will rx flare and ask her to return one day preop for pft's off acei and on symbicort 160 2bid at least short term  See instructions for specific recommendations which were reviewed directly with the patient who was given a copy with highlighter outlining the key components.

## 2013-02-15 ENCOUNTER — Encounter (HOSPITAL_BASED_OUTPATIENT_CLINIC_OR_DEPARTMENT_OTHER): Payer: Self-pay | Admitting: *Deleted

## 2013-02-16 ENCOUNTER — Encounter (HOSPITAL_BASED_OUTPATIENT_CLINIC_OR_DEPARTMENT_OTHER): Payer: Self-pay | Admitting: *Deleted

## 2013-02-16 NOTE — Progress Notes (Signed)
Pt instructed npo p mn 2/19 x zoloft, bystolic w sip of water .  Ok to take pain med w sip of water.  Use nebulizer rx am of surgery and bring albuterol inhaler w her.  No smoking p mn as well.  To [facility] 2/20 at 0615.  Needs istat on arrival.  ekg in epic.

## 2013-02-17 ENCOUNTER — Encounter: Payer: Self-pay | Admitting: Internal Medicine

## 2013-02-17 ENCOUNTER — Ambulatory Visit (INDEPENDENT_AMBULATORY_CARE_PROVIDER_SITE_OTHER)
Admission: RE | Admit: 2013-02-17 | Discharge: 2013-02-17 | Disposition: A | Discharge status: Home / Self Care | Payer: Medicare Other | Source: Ambulatory Visit | Attending: Internal Medicine | Admitting: Internal Medicine

## 2013-02-17 ENCOUNTER — Ambulatory Visit (INDEPENDENT_AMBULATORY_CARE_PROVIDER_SITE_OTHER): Payer: Medicare Other | Admitting: Internal Medicine

## 2013-02-17 VITALS — BP 150/72 | Temp 98.0°F | Ht 66.0 in | Wt 162.6 lb

## 2013-02-17 DIAGNOSIS — M25559 Pain in unspecified hip: Secondary | ICD-10-CM

## 2013-02-17 NOTE — Assessment & Plan Note (Signed)
Adequate control on present rx, reviewed need to stay off acei indefinitely based on how much better he's doing off acei  Strongly prefer in this setting: Bystolic, the most beta -1  selective Beta blocker available in sample form, with bisoprolol the most selective generic choice  on the market.

## 2013-02-17 NOTE — Progress Notes (Signed)
  Subjective:    Patient ID: [name], female    DOB: 1946/01/14  MRN: [id number]  HPI  50 yowf active smoker referred 02/10/13 by Dr Nelly Rout for copd preop eval planning vulvectomy   02/10/2013 1st ov/ pulmonary eval on acei cc chronic x years cough and congestion better p tudorza and 3 x weekly neb alb with mucus typically white and thick in am but more yellow x 2-3 days  and more limited by back than breathing chronically. rec zpak x 5 days For cough > mucinex dm 1200 mg every 12 hours Symbicort 160 Take 2 puffs first thing in am and then another 2 puffs about 12 hours later.  Stop lisinopril Start Bystolic 10 mg each am  The key is to stop smoking completely     02/17/2013 f/u ov/Wert cc much better less cough but limited by L hip pain since fell 3 weeks prior to OV  - Dr Renae Fickle is orthopedic, using lortab x 2 to get out of bed each am and no need for proventil   No obvious daytime variabilty or assoc   cp or chest tightness, subjective wheeze overt sinus or hb symptoms. No unusual exp hx or h/o childhood pna/ asthma or premature birth to her knowledge.   Sleeping ok without nocturnal  or early am exacerbation  of respiratory  c/o's or need for noct saba. Also denies any obvious fluctuation of symptoms with weather or environmental changes or other aggravating or alleviating factors except as outlined above   ROS  The following are not active complaints unless bolded sore throat, dysphagia, dental problems, itching, sneezing,  nasal congestion or excess/ purulent secretions, ear ache,   fever, chills, sweats, unintended wt loss, pleuritic or exertional cp, hemoptysis,  orthopnea pnd or leg swelling, presyncope, palpitations, heartburn, abdominal pain, anorexia, nausea, vomiting, diarrhea  or change in bowel or urinary habits, change in stools or urine, dysuria,hematuria,  rash, arthralgias, visual complaints, headache, numbness weakness or ataxia or problems with walking or  coordination,  change in mood/affect or memory.               Objective:   Physical Exam Wt 02/17/2013  162  Wt Readings from Last 3 Encounters:  02/10/13 159 lb (72.122 kg)  01/19/13 156 lb (70.761 kg)  12/31/12 150 lb (68.04 kg)    Relatively thin wf > stated age with no longer  pseudowheeze  HEENT mild turbinate edema.  Oropharynx no thrush or excess pnd or cobblestoning.  No JVD or cervical adenopathy. Mild accessory muscle hypertrophy. Trachea midline, nl thryroid. Chest was hyperinflated by percussion with diminished breath sounds and moderate increased exp time without wheeze. Hoover sign positive at mid inspiration. Regular rate and rhythm without murmur gallop or rub or increase P2 or edema.  Abd: no hsm, nl excursion. Ext warm without cyanosis or clubbing.         Assessment & Plan:

## 2013-02-17 NOTE — Patient Instructions (Addendum)
Take extra bystolic 10 mg today  Please remember to go to the  x-ray department downstairs for your tests - we will call you with the results when they are available.  Please schedule a follow up office visit in 4 weeks, sooner if needed      Cleared for gyn surgery

## 2013-02-17 NOTE — Assessment & Plan Note (Signed)
-   HFA 75% p coaching 02/17/2013  - Spirometry   Feb 17 2013 FEV1  1.23 (49%)  Ratio 52  GOLD III but much better off ACE  The proper method of use, as well as anticipated side effects, of a metered-dose inhaler are discussed and demonstrated to the patient. Improved effectiveness after extensive coaching during this visit to a level of approximately  75% so continue symbicort 160 2 bid and ok for surgery

## 2013-02-17 NOTE — Assessment & Plan Note (Signed)
Fell 02/06/13 - xray 02/17/2013 > Bilateral moderate osteoarthritis of the hips. No acute osseous  abnormality.

## 2013-02-18 ENCOUNTER — Ambulatory Visit (HOSPITAL_COMMUNITY): Payer: Medicare Other

## 2013-02-18 ENCOUNTER — Ambulatory Visit (HOSPITAL_BASED_OUTPATIENT_CLINIC_OR_DEPARTMENT_OTHER)
Admission: RE | Admit: 2013-02-18 | Discharge: 2013-02-18 | Disposition: A | Discharge status: Home / Self Care | Payer: Medicare Other | Source: Ambulatory Visit | Attending: Gynecologic Oncology | Admitting: Gynecologic Oncology

## 2013-02-18 ENCOUNTER — Encounter (HOSPITAL_BASED_OUTPATIENT_CLINIC_OR_DEPARTMENT_OTHER)
Admission: RE | Disposition: A | Discharge status: Home / Self Care | Payer: Self-pay | Source: Ambulatory Visit | Attending: Gynecologic Oncology

## 2013-02-18 ENCOUNTER — Encounter (HOSPITAL_BASED_OUTPATIENT_CLINIC_OR_DEPARTMENT_OTHER): Payer: Self-pay | Admitting: *Deleted

## 2013-02-18 ENCOUNTER — Telehealth: Payer: Self-pay | Admitting: *Deleted

## 2013-02-18 DIAGNOSIS — J4489 Other specified chronic obstructive pulmonary disease: Secondary | ICD-10-CM | POA: Insufficient documentation

## 2013-02-18 DIAGNOSIS — J449 Chronic obstructive pulmonary disease, unspecified: Secondary | ICD-10-CM | POA: Insufficient documentation

## 2013-02-18 DIAGNOSIS — Z9079 Acquired absence of other genital organ(s): Secondary | ICD-10-CM | POA: Insufficient documentation

## 2013-02-18 DIAGNOSIS — D071 Carcinoma in situ of vulva: Secondary | ICD-10-CM | POA: Insufficient documentation

## 2013-02-18 DIAGNOSIS — Z90710 Acquired absence of both cervix and uterus: Secondary | ICD-10-CM | POA: Insufficient documentation

## 2013-02-18 DIAGNOSIS — Z8673 Personal history of transient ischemic attack (TIA), and cerebral infarction without residual deficits: Secondary | ICD-10-CM | POA: Insufficient documentation

## 2013-02-18 DIAGNOSIS — I10 Essential (primary) hypertension: Secondary | ICD-10-CM | POA: Insufficient documentation

## 2013-02-18 DIAGNOSIS — Z79899 Other long term (current) drug therapy: Secondary | ICD-10-CM | POA: Insufficient documentation

## 2013-02-18 HISTORY — DX: Shortness of breath: R06.02

## 2013-02-18 HISTORY — DX: Family history of other specified conditions: Z84.89

## 2013-02-18 LAB — POCT I-STAT 4, (NA,K, GLUC, HGB,HCT)
Glucose, Bld: 92 mg/dL (ref 70–99)
Hemoglobin: 12.6 g/dL (ref 12.0–15.0)
Potassium: 3.6 mEq/L (ref 3.5–5.1)

## 2013-02-18 SURGERY — Surgical Case

## 2013-02-18 MED ORDER — PREDNISONE 10 MG PO TABS: ORAL_TABLET | ORAL | Status: DC

## 2013-02-18 MED ORDER — ALBUTEROL SULFATE (5 MG/ML) 0.5% IN NEBU
2.5000 mg | INHALATION_SOLUTION | Freq: Once | RESPIRATORY_TRACT | Status: AC
  Administered 2013-02-18: 2.5 mg via RESPIRATORY_TRACT
  Filled 2013-02-18: qty 0.5

## 2013-02-18 MED ORDER — AZITHROMYCIN 250 MG PO TABS: ORAL_TABLET | ORAL | Status: DC

## 2013-02-18 MED ORDER — HYDROCODONE-ACETAMINOPHEN 7.5-325 MG PO TABS
1.0000 | ORAL_TABLET | Freq: Four times a day (QID) | ORAL | Status: DC | PRN
  Administered 2013-02-18: 1 via ORAL
  Filled 2013-02-18: qty 1

## 2013-02-18 MED ORDER — LACTATED RINGERS IV SOLN
INTRAVENOUS | Status: DC
  Administered 2013-02-18 – 2013-03-02 (×2): via INTRAVENOUS
  Filled 2013-02-18: qty 1000

## 2013-02-18 SURGICAL SUPPLY — 39 items
BLADE SURG 15 STRL LF DISP TIS (BLADE) IMPLANT
BLADE SURG 15 STRL SS (BLADE)
CANISTER SUCTION 1200CC (MISCELLANEOUS) IMPLANT
CANISTER SUCTION 2500CC (MISCELLANEOUS) IMPLANT
CATH ROBINSON RED A/P 16FR (CATHETERS) IMPLANT
CLOTH BEACON ORANGE TIMEOUT ST (SAFETY) ×1 IMPLANT
DRAPE LG THREE QUARTER DISP (DRAPES) ×1 IMPLANT
DRAPE UNDERBUTTOCKS STRL (DRAPE) ×1 IMPLANT
ELECT NDL TIP 2.8 STRL (NEEDLE) ×1 IMPLANT
ELECT NEEDLE TIP 2.8 STRL (NEEDLE) IMPLANT
ELECT REM PT RETURN 9FT ADLT (ELECTROSURGICAL)
ELECTRODE REM PT RTRN 9FT ADLT (ELECTROSURGICAL) ×1 IMPLANT
GLOVE BIO SURGEON STRL SZ7.5 (GLOVE) ×1 IMPLANT
GLOVE BIOGEL M STRL SZ7.5 (GLOVE) ×1 IMPLANT
GLOVE SS N UNI LF 8.0 STRL (GLOVE) ×1 IMPLANT
GOWN PREVENTION PLUS LG XLONG (DISPOSABLE) ×1 IMPLANT
GOWN STRL REIN XL XLG (GOWN DISPOSABLE) ×1 IMPLANT
LEGGING LITHOTOMY PAIR STRL (DRAPES) ×1 IMPLANT
NDL SPNL 22GX3.5 QUINCKE BK (NEEDLE) IMPLANT
NEEDLE HYPO 22GX1.5 SAFETY (NEEDLE) ×1 IMPLANT
NEEDLE SPNL 22GX3.5 QUINCKE BK (NEEDLE) IMPLANT
NS IRRIG 500ML POUR BTL (IV SOLUTION) IMPLANT
PACK BASIN DAY SURGERY FS (CUSTOM PROCEDURE TRAY) ×1 IMPLANT
PAD PREP 24X48 CUFFED NSTRL (MISCELLANEOUS) ×1 IMPLANT
PENCIL BUTTON HOLSTER BLD 10FT (ELECTRODE) ×1 IMPLANT
SCOPETTES 8  STERILE (MISCELLANEOUS)
SCOPETTES 8 STERILE (MISCELLANEOUS) ×2 IMPLANT
SPONGE LAP 18X18 X RAY DECT (DISPOSABLE) ×2 IMPLANT
SUT VIC AB 2-0 SH 27 (SUTURE)
SUT VIC AB 2-0 SH 27XBRD (SUTURE) ×2 IMPLANT
SUT VIC AB 3-0 SH 18 (SUTURE) IMPLANT
SUT VIC AB 3-0 SH 27 (SUTURE)
SUT VIC AB 3-0 SH 27X BRD (SUTURE) ×2 IMPLANT
SYR CONTROL 10ML LL (SYRINGE) ×1 IMPLANT
TOWEL OR 17X24 6PK STRL BLUE (TOWEL DISPOSABLE) ×2 IMPLANT
TRAY DSU PREP LF (CUSTOM PROCEDURE TRAY) ×1 IMPLANT
TUBE CONNECTING 12X1/4 (SUCTIONS) ×1 IMPLANT
WATER STERILE IRR 500ML POUR (IV SOLUTION) ×1 IMPLANT
YANKAUER SUCT BULB TIP NO VENT (SUCTIONS) IMPLANT

## 2013-02-18 NOTE — Interval H&P Note (Signed)
History and Physical Interval Note/ See below  02/18/2013 7:30 AM  [name]  has presented today for surgery, with the diagnosis of vulvar lesion   The various methods of treatment have been discussed with the patient and family. After consideration of risks, benefits and other options for treatment.  Her pulmonary function today is not conducive with a surgical procedure, Will reschedule.  Patient aware and agrees.   Questions were answered to the patient's satisfaction.     [place], [facility]

## 2013-02-18 NOTE — Telephone Encounter (Signed)
I called the pt this morning to let her know about her hip xray results, she states that she was told that she couldn't have her upcoming surgery due to having bronchitis. Her throat is hurting and wants MW to call in an antibiotic.  MW please advise.

## 2013-02-18 NOTE — Telephone Encounter (Signed)
zpak x one and really also needs Prednisone 10 mg take  4 each am x 2 days,   2 each am x 2 days,  1 each am x2days and stop then ov 2 weeks with all meds in  hand

## 2013-02-18 NOTE — Telephone Encounter (Signed)
Pt notified of Dr Thurston Hole instructions and that rx's were sent to pharmacy.

## 2013-02-18 NOTE — H&P (View-Only) (Signed)
Consult Note: Gyn-Onc  Consult was requested by Dr. Fernandezfor the evaluation of [name] 66 y.o. female  CC:  Chief Complaint  Patient presents with  . VIN III    New Consult    HPI: This is a 66-year-old gravida 1 para 1 who initially presented to Dr. Fernandez with complaints of vaginal bleeding and hot flashes. On evaluation she was noted to have a vulvar lesion and biopsies were collected. The specimen dated 12/16/2012 was consistent with at least high-grade vulvar intraepithelial neoplasia. The patient reports that a mass is been present on her vulva for approximately 2 years. Was initially treated with a cream however bleeding did not begin until October of 2013. Patient states it she's had itching of the vulva but approximately 8 months. She denies any adenopathy she reports increased appetite and 36 pound weight gain over the last 6 months  Current Meds:  Outpatient Encounter Prescriptions as of 01/19/2013  Medication Sig Dispense Refill  . Aclidinium Bromide (TUDORZA PRESSAIR) 400 MCG/ACT AEPB Inhale 1 puff into the lungs 2 (two) times daily.      . ALPRAZolam (XANAX) 1 MG tablet Take 1 mg by mouth 4 (four) times daily.       . diphenhydrAMINE (BENADRYL) 50 MG capsule Take 50 mg by mouth daily as needed. For itching      . HYDROcodone-acetaminophen (LORTAB) 7.5-500 MG per tablet Take 1 tablet by mouth every 6 (six) hours as needed.      . lidocaine (XYLOCAINE JELLY) 2 % jelly Apply topically as needed.  30 mL  2  . lisinopril (PRINIVIL,ZESTRIL) 20 MG tablet Take 20 mg by mouth daily.      . sertraline (ZOLOFT) 100 MG tablet Take 200 mg by mouth daily.       . trazodone (DESYREL) 300 MG tablet Take 300 mg by mouth at bedtime.      . venlafaxine (EFFEXOR) 37.5 MG tablet Take 1 tablet (37.5 mg total) by mouth 2 (two) times daily.  30 tablet  5    Allergy:  Allergies  Allergen Reactions  . Nsaids     GI Upset  . Aspirin Other (See Comments)    Gi symptoms. Does NOT  take ibuprofen or other NSAIDS    Social Hx:   History   Social History  . Marital Status: Divorced    Spouse Name: N/A    Number of Children: N/A  . Years of Education: N/A   Occupational History  . Not on file.   Social History Main Topics  . Smoking status: Current Every Day Smoker -- 0.5 packs/day for 35 years    Types: Cigarettes  . Smokeless tobacco: Never Used     Comment: smoking cessation information given  . Alcohol Use: No  . Drug Use: No  . Sexually Active: No   Other Topics Concern  . Not on file   Social History Narrative  . No narrative on file    Past Surgical Hx:  Past Surgical History  Procedure Date  . Oophorectomy     RIGHT  . Carpal tunnel release     BILATERAL  . Neck fusion   . Nasal sinus surgery   . Abdominal hysterectomy     TAH w/ ovary removal  . Lumbar fusion     L4-S1  . Cervical fusion     Neck  . Hernia repair     Past Medical Hx:  Past Medical History  Diagnosis Date  . H/O:   stroke 2012, 2000,     X3  . Hypertension   . Anxiety   . PTSD (post-traumatic stress disorder)   . Depression   . PONV (postoperative nausea and vomiting)   . SVD (spontaneous vaginal delivery)     x 1  . COPD (chronic obstructive pulmonary disease)   . Asthma   . Smoker   . Osteoarthritis     knees, hips hands,   . Stroke     x 3 strokes Dr Wilson; Dr Zac Hall [place], [place]     Past Gynecological History:  Gravida 1 para 1 menarche at the age of 10 regular menses until vaginal hysterectomy for menorrhagia in her 30s No LMP recorded. Patient has had a hysterectomy.  Family Hx:  Family History  Problem Relation Age of Onset  . Cancer Mother     COLON  . Heart disease Father   . Diabetes Maternal Aunt   . Cancer Maternal Grandfather     KIDNEY   . Diabetes Maternal Grandfather   . Hypertension Maternal Grandfather   . Heart disease Maternal Grandfather     Review of Systems:  Constitutional  Feels well,  denies fever or chills  reports increased appetite 36 pound weight gain Cardiovascular  No chest pain, shortness of breath, or edema  Pulmonary  No cough ports wheezing from her long-standing emphysema has decreased her tobacco use from 1-1/2 packs per day to 10 cigarettes per day Gastro Intestinal  No nausea, vomitting, or diarrhoea. No bright red blood per rectum, no abdominal pain, change in bowel movement, or constipation.  Genito Urinary  No frequency, urgency, dysuria, reports vaginal bleeding since October 2013  Musculo Skeletal  No myalgia, arthralgia, joint swelling or pain  Neurologic  No weakness, numbness, change in gait,  Psychology  No depression, anxiety, insomnia.   Vitals:  Blood pressure 120/80, pulse 64, temperature 98.8 F (37.1 C), temperature source Oral, resp. rate 18, height 5' 5.59" (1.666 m), weight 156 lb (70.761 kg).  Physical Exam: WD in NAD Neck  Supple NROM, without any enlargements.  Lymph Node Survey No cervical supraclavicular or inguinal adenopathy Cardiovascular  Pulse normal rate, regularity and rhythm. S1 and S2 normal.  Lungs  Clear to auscultation bilateraly, hyperinflation bilaterally  Skin  Very thick facial skin. Rate down of nails almost consistent with a fungal infection however the patient states that in there since childhood  Psychiatry  Alert and oriented to person, place, and time  Abdomen  Normoactive bowel sounds, abdomen soft, non-tender and thin no palpable masses Back No CVA tenderness Genito Urinary  Vulva/vagina: External genitalia is notable for lesions on the inner aspect of the right labia minora and majora. There is 3 lesions one the most inferior measuring 7 mm in the superior lesions measuring 3 and 4 mm. These are all within the 2 cm diameter. No kissing lesions are appreciated.  Vagina without any evidence of discharge or bleeding.   Adnexa: No palpable masses. Rectal  Good tone, no masses no cul de sac nodularity.  Extremities  Loss  of most of the nailbed of her upper fingers. No edema of the lower extremities no clubbing appreciated.  Assessment/Plan:  [name]  is a 66 y.o.  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  The recommendation is for wide local excision that will encompass all 3 lesions and will be able to definitively evaluate for microinvasive disease. The patient is aware of the risks and benefits of this   procedure infection bleeding and wound breakdown. Given her  substantive tobacco use she is aware that this risk is increased.  Given the significant emphysema I've asked for a preanesthesia evaluation so that anesthesia can be tailored her pulmonary function. We'll contact the patient shortly regarding the date of surgery.   Trenise Turay, MD, PhD 01/19/2013, 4:49 PM   

## 2013-02-18 NOTE — Anesthesia Preprocedure Evaluation (Addendum)
Anesthesia Evaluation  Patient identified by MRN, date of birth, ID band Patient awake    Reviewed: Allergy & Precautions, H&P , NPO status , Patient's Chart, lab work & pertinent test results  History of Anesthesia Complications (+) PONV  Airway Mallampati: II TM Distance: >3 FB Neck ROM: Full    Dental  (+) Poor Dentition, Missing, Chipped, Dental Advisory Given and Loose   Pulmonary neg pulmonary ROS, shortness of breath and with exertion, COPDCurrent Smoker,  + rhonchi   + wheezing + stridor     Cardiovascular hypertension, Pt. on home beta blockers Rhythm:Regular Rate:Normal     Neuro/Psych Anxiety Depression CVA (tremor), Residual Symptoms negative neurological ROS  negative psych ROS   GI/Hepatic negative GI ROS, (+)     substance abuse ("Chronic pain" with oral narcotic dependence)   ,   Endo/Other  negative endocrine ROS  Renal/GU negative Renal ROS  negative genitourinary   Musculoskeletal negative musculoskeletal ROS (+)   Abdominal   Peds  Hematology negative hematology ROS (+)   Anesthesia Other Findings States bronchitis two weeks ago; symptoms of SOB and expiratory wheeze worse today.  Reproductive/Obstetrics negative OB ROS                          Anesthesia Physical Anesthesia Plan  ASA: III  Anesthesia Plan: General   Post-op Pain Management:    Induction: Intravenous  Airway Management Planned: LMA  Additional Equipment:   Intra-op Plan:   Post-operative Plan:   Informed Consent: I have reviewed the patients History and Physical, chart, labs and discussed the procedure including the risks, benefits and alternatives for the proposed anesthesia with the patient or authorized representative who has indicated his/her understanding and acceptance.   Dental advisory given  Plan Discussed with: CRNA and Surgeon  Anesthesia Plan Comments: (Elect to delay  surgery due to significant upper respiratory congestion, SOB.. Patient for CXR and ventolin treatment and will be rescheduled at later date.)      Anesthesia Quick Evaluation

## 2013-02-22 ENCOUNTER — Telehealth: Payer: Self-pay | Admitting: Internal Medicine

## 2013-02-22 NOTE — Telephone Encounter (Signed)
yes

## 2013-02-22 NOTE — Telephone Encounter (Signed)
MW, are you okay with adding this patient to your afternoon schedule today for surgery clearance? Thanks.

## 2013-02-22 NOTE — Telephone Encounter (Signed)
Spoke with patient-she is unable to get her today so therefore I added her to Wed 02/24/13 at 11:30am. She is trying to establish a ride here. Will let us know if this time will not work for her.

## 2013-02-23 ENCOUNTER — Ambulatory Visit: Payer: Medicare Other | Admitting: Internal Medicine

## 2013-02-24 ENCOUNTER — Ambulatory Visit: Payer: Medicare Other | Admitting: Internal Medicine

## 2013-02-24 NOTE — Progress Notes (Signed)
NEED PRE OP ORDERS PLEASE- PST 02/26/13

## 2013-02-25 ENCOUNTER — Encounter (HOSPITAL_COMMUNITY): Payer: Self-pay | Admitting: Pharmacy Technician

## 2013-02-26 ENCOUNTER — Encounter (HOSPITAL_COMMUNITY): Payer: Self-pay

## 2013-02-26 ENCOUNTER — Encounter (HOSPITAL_COMMUNITY)
Admission: RE | Admit: 2013-02-26 | Discharge: 2013-02-26 | Disposition: A | Discharge status: Home / Self Care | Payer: Medicare Other | Source: Ambulatory Visit | Attending: Gynecologic Oncology | Admitting: Gynecologic Oncology

## 2013-02-26 HISTORY — DX: Menopausal and female climacteric states: N95.1

## 2013-02-26 HISTORY — DX: Pain, unspecified: R52

## 2013-02-26 LAB — BASIC METABOLIC PANEL
BUN: 5 mg/dL — ABNORMAL LOW (ref 6–23)
Chloride: 98 mEq/L (ref 96–112)
GFR calc non Af Amer: 89 mL/min — ABNORMAL LOW (ref >90)
Glucose, Bld: 81 mg/dL (ref 70–99)
Potassium: 4.1 mEq/L (ref 3.5–5.1)

## 2013-02-26 LAB — CBC
HCT: 41 % (ref 36.0–46.0)
Hemoglobin: 13.5 g/dL (ref 12.0–15.0)
MCHC: 32.9 g/dL (ref 30.0–36.0)

## 2013-02-26 LAB — SURGICAL PCR SCREEN: Staphylococcus aureus: NEGATIVE

## 2013-02-26 NOTE — Patient Instructions (Signed)
YOUR SURGERY IS SCHEDULED AT [facility]  ON:  Tuesday   3/4  REPORT TO [place] [facility] AT:  12:30 PM      PHONE # FOR SHORT STAY IS [phone]  DO NOT EAT ANYTHING AFTER MIDNIGHT THE NIGHT BEFORE YOUR SURGERY.  NO FOOD, NO CHEWING GUM, NO MINTS, NO CANDIES, NO CHEWING TOBACCO. YOU MAY HAVE CLEAR LIQUIDS TO DRINK FROM MIDNIGHT THE NIGHT BEFORE SURGERY UNTIL 9:00 AM  -  LIKE WATER, COFFEE (NO MILK OR CREAM OR OTHER MILK PRODUCTS) AND ICE TEA.    NOTHING TO DRINK AFTER 9:00 AM DAY OF SURGERY.  PLEASE TAKE THE FOLLOWING MEDICATIONS THE AM OF YOUR SURGERY WITH A FEW SIPS OF WATER:  ALPRAZOLAM, BENADRYL, MUCINEX, HYROCODONE/ACETAMINOPHEN, BYSTOLIC, ZOLOFT.  USE BOTH OF YOUR INHALERS - TUDORZA, AMD SYMBICORT.  IF YOU USE INHALERS--USE YOUR INHALERS THE AM OF YOUR SURGERY AND BRING INHALERS TO THE HOSPITAL.      DO NOT BRING VALUABLES, MONEY, CREDIT CARDS.  DO NOT WEAR JEWELRY, MAKE-UP, NAIL POLISH AND NO METAL PINS OR CLIPS IN YOUR HAIR. CONTACT LENS, DENTURES / PARTIALS, GLASSES SHOULD NOT BE WORN TO SURGERY AND IN MOST CASES-HEARING AIDS WILL NEED TO BE REMOVED.  BRING YOUR GLASSES CASE, ANY EQUIPMENT NEEDED FOR YOUR CONTACT LENS. FOR PATIENTS ADMITTED TO THE HOSPITAL--CHECK OUT TIME THE DAY OF DISCHARGE IS 11:00 AM.  ALL INPATIENT ROOMS ARE PRIVATE - WITH BATHROOM, TELEPHONE, TELEVISION AND WIFI INTERNET.  IF YOU ARE BEING DISCHARGED THE SAME DAY OF YOUR SURGERY--YOU CAN NOT DRIVE YOURSELF HOME--AND SHOULD NOT GO HOME ALONE BY TAXI OR BUS.  NO DRIVING OR OPERATING MACHINERY FOR 24 HOURS FOLLOWING ANESTHESIA / PAIN MEDICATIONS.  PLEASE MAKE ARRANGEMENTS FOR SOMEONE TO BE WITH YOU AT HOME THE FIRST 24 HOURS AFTER SURGERY. RESPONSIBLE DRIVER'S NAME  [name] SON [name]                                               PHONE #   [phone]                           PLEASE READ OVER ANY  FACT SHEETS THAT YOU WERE GIVEN: MRSA INFORMATION FAILURE TO FOLLOW THESE INSTRUCTIONS MAY  RESULT IN THE CANCELLATION OF YOUR SURGERY.   PATIENT SIGNATURE_________________________________

## 2013-02-26 NOTE — Pre-Procedure Instructions (Signed)
CXR REPORT IN EPIC FROM 02/18/13 EKG REPORT IN EPIC FROM 12/31/12

## 2013-03-01 ENCOUNTER — Telehealth: Payer: Self-pay | Admitting: Internal Medicine

## 2013-03-01 NOTE — Telephone Encounter (Signed)
Spoke with pt She states that she has been "feeling like BP is up" She is unable to check it at home but can "hear pulse in my ear" She states that she took an extra bystolic 10 mg and her lisinopril and this helps a lot She states that she wants to take bystolic 20 mg every day instead of 10 mg  I advised will forward msg to MW to ask, but she may need to check with her PCP since they manage her BP  Pt verbalized understanding and states nothing further needed Please advise thanks!

## 2013-03-01 NOTE — Telephone Encounter (Signed)
Ok to increase the bystolic but do not take lisinopril

## 2013-03-01 NOTE — Progress Notes (Signed)
Called pt's son about time change for pt's surgery. Pt to arrive at 1240 for 1440 surgery on 03/02/13. Son states he does not know if he will be able to get his mother to surgery because of the weather conditions. He is given phone numbers to call if he cannot make it surgery. He verbalizes understanding.

## 2013-03-02 ENCOUNTER — Observation Stay (HOSPITAL_COMMUNITY)
Admission: RE | Admit: 2013-03-02 | Discharge: 2013-03-03 | Disposition: A | Discharge status: Home / Self Care | Payer: Medicare Other | Source: Ambulatory Visit | Attending: Gynecologic Oncology | Admitting: Gynecologic Oncology

## 2013-03-02 ENCOUNTER — Encounter (HOSPITAL_COMMUNITY)
Admission: RE | Disposition: A | Discharge status: Home / Self Care | Payer: Self-pay | Source: Ambulatory Visit | Attending: Gynecologic Oncology

## 2013-03-02 ENCOUNTER — Encounter (HOSPITAL_COMMUNITY): Payer: Self-pay | Admitting: *Deleted

## 2013-03-02 ENCOUNTER — Encounter (HOSPITAL_COMMUNITY): Payer: Self-pay | Admitting: Anesthesiology

## 2013-03-02 ENCOUNTER — Ambulatory Visit (HOSPITAL_COMMUNITY): Payer: Medicare Other | Admitting: Anesthesiology

## 2013-03-02 DIAGNOSIS — T8859XA Other complications of anesthesia, initial encounter: Secondary | ICD-10-CM

## 2013-03-02 DIAGNOSIS — I10 Essential (primary) hypertension: Secondary | ICD-10-CM | POA: Insufficient documentation

## 2013-03-02 DIAGNOSIS — S92209A Fracture of unspecified tarsal bone(s) of unspecified foot, initial encounter for closed fracture: Secondary | ICD-10-CM

## 2013-03-02 DIAGNOSIS — Z79899 Other long term (current) drug therapy: Secondary | ICD-10-CM | POA: Insufficient documentation

## 2013-03-02 DIAGNOSIS — D071 Carcinoma in situ of vulva: Principal | ICD-10-CM | POA: Insufficient documentation

## 2013-03-02 DIAGNOSIS — R51 Headache: Secondary | ICD-10-CM | POA: Insufficient documentation

## 2013-03-02 DIAGNOSIS — J4489 Other specified chronic obstructive pulmonary disease: Secondary | ICD-10-CM | POA: Insufficient documentation

## 2013-03-02 DIAGNOSIS — Z8673 Personal history of transient ischemic attack (TIA), and cerebral infarction without residual deficits: Secondary | ICD-10-CM | POA: Insufficient documentation

## 2013-03-02 HISTORY — PX: VULVECTOMY: SHX1086

## 2013-03-02 HISTORY — DX: Other complications of anesthesia, initial encounter: T88.59XA

## 2013-03-02 SURGERY — WIDE EXCISION VULVECTOMY
Anesthesia: General | Wound class: Clean Contaminated

## 2013-03-02 MED ORDER — ACETAMINOPHEN 10 MG/ML IV SOLN
INTRAVENOUS | Status: DC | PRN
  Administered 2013-03-02: 1000 mg via INTRAVENOUS

## 2013-03-02 MED ORDER — NEBIVOLOL HCL 10 MG PO TABS
10.0000 mg | ORAL_TABLET | Freq: Every morning | ORAL | Status: DC
  Administered 2013-03-03: 10 mg via ORAL
  Filled 2013-03-02: qty 1

## 2013-03-02 MED ORDER — ONDANSETRON HCL 4 MG/2ML IJ SOLN
INTRAMUSCULAR | Status: DC | PRN
  Administered 2013-03-02: 4 mg via INTRAVENOUS

## 2013-03-02 MED ORDER — HYDRALAZINE HCL 20 MG/ML IJ SOLN
INTRAMUSCULAR | Status: DC | PRN
  Administered 2013-03-02 (×2): 5 mg via INTRAVENOUS

## 2013-03-02 MED ORDER — SERTRALINE HCL 100 MG PO TABS
200.0000 mg | ORAL_TABLET | Freq: Every day | ORAL | Status: DC
  Administered 2013-03-03: 200 mg via ORAL
  Filled 2013-03-02: qty 2

## 2013-03-02 MED ORDER — PROPOFOL 10 MG/ML IV BOLUS
INTRAVENOUS | Status: DC | PRN
  Administered 2013-03-02: 200 mg via INTRAVENOUS

## 2013-03-02 MED ORDER — GUAIFENESIN ER 600 MG PO TB12
1200.0000 mg | ORAL_TABLET | Freq: Two times a day (BID) | ORAL | Status: DC
  Administered 2013-03-02 – 2013-03-03 (×2): 1200 mg via ORAL
  Filled 2013-03-02 (×3): qty 2

## 2013-03-02 MED ORDER — LIDOCAINE HCL (CARDIAC) 20 MG/ML IV SOLN
INTRAVENOUS | Status: DC | PRN
  Administered 2013-03-02: 100 mg via INTRAVENOUS

## 2013-03-02 MED ORDER — MIDAZOLAM HCL 5 MG/5ML IJ SOLN
INTRAMUSCULAR | Status: DC | PRN
  Administered 2013-03-02: 2 mg via INTRAVENOUS

## 2013-03-02 MED ORDER — ACETAMINOPHEN 10 MG/ML IV SOLN
INTRAVENOUS | Status: AC
  Filled 2013-03-02: qty 100

## 2013-03-02 MED ORDER — ACETIC ACID 5 % SOLN
Status: AC
  Filled 2013-03-02: qty 500

## 2013-03-02 MED ORDER — ALPRAZOLAM 1 MG PO TABS
1.0000 mg | ORAL_TABLET | Freq: Four times a day (QID) | ORAL | Status: DC
  Administered 2013-03-02 – 2013-03-03 (×3): 1 mg via ORAL
  Filled 2013-03-02 (×3): qty 1

## 2013-03-02 MED ORDER — ACETIC ACID 5 % SOLN
Status: DC | PRN
  Administered 2013-03-02: 1 via TOPICAL

## 2013-03-02 MED ORDER — HYDROCODONE-ACETAMINOPHEN 10-325 MG PO TABS: 1.0000 | ORAL_TABLET | Freq: Four times a day (QID) | ORAL | Status: DC | PRN

## 2013-03-02 MED ORDER — BUPIVACAINE-EPINEPHRINE 0.25% -1:200000 IJ SOLN
INTRAMUSCULAR | Status: DC | PRN
  Administered 2013-03-02: 9 mL

## 2013-03-02 MED ORDER — KETAMINE HCL 50 MG/ML IJ SOLN
INTRAMUSCULAR | Status: DC | PRN
  Administered 2013-03-02: 25 mg via INTRAMUSCULAR
  Administered 2013-03-02: 50 mg via INTRAMUSCULAR
  Administered 2013-03-02: 25 mg via INTRAMUSCULAR

## 2013-03-02 MED ORDER — BUPIVACAINE-EPINEPHRINE PF 0.25-1:200000 % IJ SOLN
INTRAMUSCULAR | Status: AC
  Filled 2013-03-02: qty 30

## 2013-03-02 MED ORDER — HYDROCODONE-ACETAMINOPHEN 5-325 MG PO TABS
1.0000 | ORAL_TABLET | ORAL | Status: DC | PRN
  Administered 2013-03-02: 1 via ORAL
  Administered 2013-03-02 – 2013-03-03 (×3): 2 via ORAL
  Filled 2013-03-02: qty 2
  Filled 2013-03-02: qty 1
  Filled 2013-03-02 (×3): qty 2

## 2013-03-02 MED ORDER — LISINOPRIL 20 MG PO TABS
20.0000 mg | ORAL_TABLET | Freq: Every morning | ORAL | Status: DC
  Administered 2013-03-03: 20 mg via ORAL
  Filled 2013-03-02: qty 1

## 2013-03-02 MED ORDER — ACLIDINIUM BROMIDE 400 MCG/ACT IN AEPB: 1.0000 | INHALATION_SPRAY | Freq: Two times a day (BID) | RESPIRATORY_TRACT | Status: DC

## 2013-03-02 MED ORDER — MIDAZOLAM HCL 10 MG/2ML IJ SOLN
2.0000 mg | Freq: Once | INTRAMUSCULAR | Status: AC
  Administered 2013-03-02: 2 mg via INTRAVENOUS

## 2013-03-02 MED ORDER — HYDROMORPHONE HCL PF 1 MG/ML IJ SOLN: 0.2500 mg | INTRAMUSCULAR | Status: DC | PRN

## 2013-03-02 MED ORDER — MIDAZOLAM HCL 2 MG/2ML IJ SOLN
INTRAMUSCULAR | Status: AC
  Filled 2013-03-02: qty 2

## 2013-03-02 MED ORDER — DIPHENHYDRAMINE HCL 25 MG PO CAPS
25.0000 mg | ORAL_CAPSULE | Freq: Two times a day (BID) | ORAL | Status: DC
  Administered 2013-03-02 – 2013-03-03 (×2): 25 mg via ORAL
  Filled 2013-03-02 (×3): qty 1

## 2013-03-02 MED ORDER — NEBIVOLOL HCL 20 MG PO TABS: 1.0000 | ORAL_TABLET | Freq: Every day | ORAL | Status: DC

## 2013-03-02 MED ORDER — LACTATED RINGERS IV SOLN
INTRAVENOUS | Status: DC
  Administered 2013-03-02: 1000 mL via INTRAVENOUS

## 2013-03-02 MED ORDER — PROMETHAZINE HCL 25 MG/ML IJ SOLN: 6.2500 mg | INTRAMUSCULAR | Status: DC | PRN

## 2013-03-02 MED ORDER — FENTANYL CITRATE 0.05 MG/ML IJ SOLN
INTRAMUSCULAR | Status: DC | PRN
  Administered 2013-03-02: 100 ug via INTRAVENOUS
  Administered 2013-03-02 (×3): 50 ug via INTRAVENOUS

## 2013-03-02 MED ORDER — BUDESONIDE-FORMOTEROL FUMARATE 160-4.5 MCG/ACT IN AERO
2.0000 | INHALATION_SPRAY | Freq: Two times a day (BID) | RESPIRATORY_TRACT | Status: DC | PRN
  Filled 2013-03-02: qty 6

## 2013-03-02 MED ORDER — CHLORZOXAZONE 500 MG PO TABS
500.0000 mg | ORAL_TABLET | Freq: Four times a day (QID) | ORAL | Status: DC | PRN
  Administered 2013-03-02 – 2013-03-03 (×2): 500 mg via ORAL
  Filled 2013-03-02 (×3): qty 1

## 2013-03-02 MED ORDER — DEXAMETHASONE SODIUM PHOSPHATE 4 MG/ML IJ SOLN
INTRAMUSCULAR | Status: DC | PRN
  Administered 2013-03-02: 10 mg via INTRAVENOUS

## 2013-03-02 SURGICAL SUPPLY — 41 items
ATTRACTOMAT 16X20 MAGNETIC DRP (DRAPES) ×1 IMPLANT
BLADE SURG 15 STRL LF DISP TIS (BLADE) ×2 IMPLANT
BLADE SURG 15 STRL SS (BLADE) ×2
CANISTER SUCTION 2500CC (MISCELLANEOUS) ×2 IMPLANT
CATH ROBINSON RED A/P 16FR (CATHETERS) ×1 IMPLANT
CLIP TI MEDIUM LARGE 6 (CLIP) IMPLANT
CLOTH BEACON ORANGE TIMEOUT ST (SAFETY) ×2 IMPLANT
COVER MAYO STAND STRL (DRAPES) ×1 IMPLANT
DRAIN CHANNEL RND F F (WOUND CARE) ×1 IMPLANT
DRAPE LG THREE QUARTER DISP (DRAPES) ×2 IMPLANT
DRAPE SURG IRRIG POUCH 19X23 (DRAPES) ×1 IMPLANT
EVACUATOR SILICONE 100CC (DRAIN) ×1 IMPLANT
GAUZE SPONGE 4X4 16PLY XRAY LF (GAUZE/BANDAGES/DRESSINGS) ×2 IMPLANT
GLOVE BIO SURGEON STRL SZ 6.5 (GLOVE) ×2 IMPLANT
GLOVE BIO SURGEON STRL SZ7.5 (GLOVE) ×3 IMPLANT
GLOVE BIOGEL PI IND STRL 7.0 (GLOVE) ×1 IMPLANT
GLOVE BIOGEL PI INDICATOR 7.0 (GLOVE) ×1
GOWN PREVENTION PLUS XLARGE (GOWN DISPOSABLE) ×2 IMPLANT
GOWN STRL NON-REIN LRG LVL3 (GOWN DISPOSABLE) ×1 IMPLANT
NEEDLE HYPO 22GX1.5 SAFETY (NEEDLE) ×1 IMPLANT
NS IRRIG 1000ML POUR BTL (IV SOLUTION) ×1 IMPLANT
PACK COOL COMFORT PERI COLD (SET/KITS/TRAYS/PACK) ×3 IMPLANT
PACK MINOR VAGINAL W LONG (CUSTOM PROCEDURE TRAY) ×2 IMPLANT
PENCIL BUTTON HOLSTER BLD 10FT (ELECTRODE) ×1 IMPLANT
SHEET LAVH (DRAPES) ×1 IMPLANT
SPONGE GAUZE 4X4 12PLY (GAUZE/BANDAGES/DRESSINGS) ×1 IMPLANT
SPONGE LAP 18X18 X RAY DECT (DISPOSABLE) ×3 IMPLANT
SUT VIC AB 2-0 CT2 27 (SUTURE) ×4 IMPLANT
SUT VIC AB 2-0 SH 27 (SUTURE)
SUT VIC AB 2-0 SH 27X BRD (SUTURE) IMPLANT
SUT VIC AB 3-0 PS2 18 (SUTURE) ×6
SUT VIC AB 3-0 PS2 18XBRD (SUTURE) IMPLANT
SUT VICRYL 2 0 18  UND BR (SUTURE)
SUT VICRYL 2 0 18 UND BR (SUTURE) ×1 IMPLANT
SUT VICRYL RAPIDE 3 0 (SUTURE) ×6 IMPLANT
SYR CONTROL 10ML LL (SYRINGE) ×1 IMPLANT
TOWEL OR 17X26 10 PK STRL BLUE (TOWEL DISPOSABLE) ×2 IMPLANT
TOWEL OR NON WOVEN STRL DISP B (DISPOSABLE) ×1 IMPLANT
TRAY FOLEY CATH 14FRSI W/METER (CATHETERS) ×2 IMPLANT
WATER STERILE IRR 1500ML POUR (IV SOLUTION) ×1 IMPLANT
YANKAUER SUCT BULB TIP 10FT TU (MISCELLANEOUS) ×1 IMPLANT

## 2013-03-02 NOTE — OR Nursing (Signed)
Pt reports no one to stay with her at home. Harriett Sine RN notified/ to let Dr Jean Rosenthal Christell Constant know for admit orders.

## 2013-03-02 NOTE — Progress Notes (Signed)
Pt gives own medication. Med not on Mar.

## 2013-03-02 NOTE — Telephone Encounter (Signed)
Spoke with pt and notified of recs per MW Pt verbalized understanding and denied any questions Rx was sent to pharm for bystolic

## 2013-03-02 NOTE — Progress Notes (Signed)
Patient is out of bed and ambulating with her cane without difficulty.

## 2013-03-02 NOTE — Progress Notes (Signed)
Upon speaking with the patient, she is requesting a visiting aid to come out to her apartment to help with daily chores such as vaccuming and the dishes.

## 2013-03-02 NOTE — Transfer of Care (Signed)
Immediate Anesthesia Transfer of Care Note  Patient: [name]  Procedure(s) Performed: Procedure(s): WIDE LOCAL EXCISION VULVAR (N/A)  Patient Location: PACU  Anesthesia Type:General  Level of Consciousness: awake, alert  and oriented  Airway & Oxygen Therapy: Patient Spontanous Breathing and Patient connected to face mask oxygen  Post-op Assessment: Report given to PACU RN, Post -op Vital signs reviewed and stable and Patient moving all extremities X 4  Post vital signs: stable  Complications: No apparent anesthesia complications

## 2013-03-02 NOTE — Progress Notes (Signed)
Patient received from PACU post op.  Patient is awake alert and oriented x4.  Patient does not complain of pain at this time.  Bed alarm in use.  Call bell within reach.  Patient demostrates use of call bell.  Instructed patient to call prior to ambulating.  Patient without questions at this time.  Will continue to monitor.

## 2013-03-02 NOTE — Interval H&P Note (Signed)
History and Physical Interval Note:  03/02/2013 1:51 PM  [name]  has presented today for surgery, with the diagnosis of Vulvar dyplasia  The various methods of treatment have been discussed with the patient and family. After consideration of risks, benefits and other options for treatment, the patient has consented to  Procedure(s): WIDE LOCAL EXCISION VULVAR (N/A) as a surgical intervention .  The patient's history has been reviewed, patient examined, no change in status, stable for surgery.  I have reviewed the patient's chart and labs.  Questions were answered to the patient's satisfaction.     Carlo Guevarra A.

## 2013-03-02 NOTE — Op Note (Signed)
PATIENT: [name] DATE OF BIRTH: 1946/12/28 ENCOUNTER DATE: 03/02/2013   Preop Diagnosis: VIN 3  Postoperative Diagnosis: same.   Surgery:Wide local excision right vulva  Surgeons:  Rejeana Brock A. Duard Brady, MD; Antionette Char, MD   Assistant: None  Anesthesia: General with LMA  Estimated blood loss: <25  ml   IVF: 1100 ml   Urine output: 500 ml   Complications: None   Pathology: Vulvar excision with a suture at 12:00  Operative findings: 2 lesions each measuring about 7mm on the right vulva  Procedure: The patient was identified in the preoperative holding area. Informed consent was signed on the chart. Patient was seen history was reviewed and exam was performed.   The patient was then taken to the operating room and placed in the supine position with SCD hose on. General anesthesia was then induced without difficulty. She was then placed in the dorsolithotomy position. First timeout was performed to confirm the patient, procedure, antibiotic, allergy status, estimated blood loss and OR time. The perineum was then prepped in the usual fashion with Betadine. An I&O catheter was inserted into the bladder under sterile conditions.  Acetic acid was applied and the findings as above were noted.  9 mL of 1/4% marcaine plain was injected for post-operative pain control.  An eliptical incision was made with a 15 blade around both incisions. The incision measured about 4 cm x 1.5 cm.  The lesions were excised at the level of the subcutaneous tissues.  The specimen was marked at 12:00.  Hemostasis was obtained with pinpoint cautery. The incision was closed with interrupted sutures of 3.0 vicryl.  All instrument needle and Ray-Tec counts were correct x2. The patient tolerated the procedure well and was taken to the recovery room in stable condition. This is Cleda Mccreedy dictating an operative note on patient [name].

## 2013-03-02 NOTE — H&P (Signed)
H&P  [name] 67 y.o. female  CC: No chief complaint on file. HPI: This is a 67 year old gravida 1 para 1 who initially presented to Dr. Lily Peer with complaints of vaginal bleeding and hot flashes. On evaluation she was noted to have a vulvar lesion and biopsies were collected. The specimen dated 12/16/2012 was consistent with at least high-grade vulvar intraepithelial neoplasia. The patient reports that a mass is been present on her vulva for approximately 2 years. Was initially treated with a cream however bleeding did not begin until October of 2013. Patient states it she's had itching of the vulva but approximately 8 months. She denies any adenopathy she reports increased appetite and 36 pound weight gain over the last 6 months  Current Meds:  Outpatient Encounter Prescriptions as of 01/19/2013   Medication  Sig  Dispense  Refill   .  Aclidinium Bromide (TUDORZA PRESSAIR) 400 MCG/ACT AEPB  Inhale 1 puff into the lungs 2 (two) times daily.     [name]  ALPRAZolam (XANAX) 1 MG tablet  Take 1 mg by mouth 4 (four) times daily.     .  diphenhydrAMINE (BENADRYL) 50 MG capsule  Take 50 mg by mouth daily as needed. For itching     .  HYDROcodone-acetaminophen (LORTAB) 7.5-500 MG per tablet  Take 1 tablet by mouth every 6 (six) hours as needed.     .  lidocaine (XYLOCAINE JELLY) 2 % jelly  Apply topically as needed.  30 mL  2   .  lisinopril (PRINIVIL,ZESTRIL) 20 MG tablet  Take 20 mg by mouth daily.     .  sertraline (ZOLOFT) 100 MG tablet  Take 200 mg by mouth daily.     .  trazodone (DESYREL) 300 MG tablet  Take 300 mg by mouth at bedtime.     [name]  venlafaxine (EFFEXOR) 37.5 MG tablet  Take 1 tablet (37.5 mg total) by mouth 2 (two) times daily.  30 tablet  5   Allergy:  Allergies   Allergen  Reactions   .  Nsaids      GI Upset   .  Aspirin  Other (See Comments)     Gi symptoms. Does NOT take ibuprofen or other NSAIDS   Social Hx:  History    Social History   .  Marital Status:  Divorced      Spouse Name:  N/A     Number of Children:  N/A   .  Years of Education:  N/A    Occupational History   .  Not on file.    Social History Main Topics   .  Smoking status:  Current Every Day Smoker -- 0.5 packs/day for 35 years     Types:  Cigarettes   .  Smokeless tobacco:  Never Used      Comment: smoking cessation information given   .  Alcohol Use:  No   .  Drug Use:  No   .  Sexually Active:  No    Other Topics  Concern   .  Not on file    Social History Narrative   .  No narrative on file   Past Surgical Hx:  Past Surgical History   Procedure  Date   .  Oophorectomy      RIGHT   .  Carpal tunnel release      BILATERAL   .  Neck fusion    .  Nasal sinus surgery    .  Abdominal hysterectomy      TAH w/ ovary removal   .  Lumbar fusion      L4-S1   .  Cervical fusion      Neck   .  Hernia repair    Past Medical Hx:  Past Medical History   Diagnosis  Date   .  H/O: stroke  2012, 2000,     X3   .  Hypertension    .  Anxiety    .  PTSD (post-traumatic stress disorder)    .  Depression    .  PONV (postoperative nausea and vomiting)    .  SVD (spontaneous vaginal delivery)      x 1   .  COPD (chronic obstructive pulmonary disease)    .  Asthma    .  Smoker    .  Osteoarthritis      knees, hips hands,   .  Stroke      x 3 strokes Dr Andrey Campanile; Dr Marnee Spring Ezequiel Ganser, [place]   Past Gynecological History: Gravida 1 para 1 menarche at the age of 79 regular menses until vaginal hysterectomy for menorrhagia in her 30s No LMP recorded. Patient has had a hysterectomy.  Family Hx:  Family History   Problem  Relation  Age of Onset   .  Cancer  Mother       COLON    .  Heart disease  Father    .  Diabetes  Maternal Aunt    .  Cancer  Maternal Grandfather       KIDNEY    .  Diabetes  Maternal Grandfather    .  Hypertension  Maternal Grandfather    .  Heart disease  Maternal Grandfather    Review of Systems:  Constitutional  Feels well, denies fever or chills  reports increased appetite 36 pound weight gain  Cardiovascular  No chest pain, shortness of breath, or edema  Pulmonary  No cough ports wheezing from her long-standing emphysema has decreased her tobacco use from 1-1/2 packs per day to 10 cigarettes per day  Gastro Intestinal  No nausea, vomitting, or diarrhoea. No bright red blood per rectum, no abdominal pain, change in bowel movement, or constipation.  Genito Urinary  No frequency, urgency, dysuria, reports vaginal bleeding since October 2013  Musculo Skeletal  No myalgia, arthralgia, joint swelling or pain  Neurologic  No weakness, numbness, change in gait,  Psychology  No depression, anxiety, insomnia.  Vitals: Blood pressure 120/80, pulse 64, temperature 98.8 F (37.1 C), temperature source Oral, resp. rate 18, height 5' 5.59" (1.666 m), weight 156 lb (70.761 kg).  Physical Exam:  WD in NAD  Neck  Supple NROM, without any enlargements.  Lymph Node Survey  No cervical supraclavicular or inguinal adenopathy  Cardiovascular  Pulse normal rate, regularity and rhythm. S1 and S2 normal.  Lungs  Clear to auscultation bilateraly, hyperinflation bilaterally  Skin  Very thick facial skin. Rate down of nails almost consistent with a fungal infection however the patient states that in there since childhood  Psychiatry  Alert and oriented to person, place, and time  Abdomen  Normoactive bowel sounds, abdomen soft, non-tender and thin no palpable masses  Back  No CVA tenderness  Genito Urinary  Vulva/vagina: External genitalia is notable for lesions on the inner aspect of the right labia minora and majora. There is 3 lesions one the most inferior measuring 7 mm in the superior lesions measuring 3 and 4  mm. These are all within the 2 cm diameter. No kissing lesions are appreciated.  Vagina without any evidence of discharge or bleeding.  Adnexa: No palpable masses.  Rectal  Good tone, no masses no cul de sac nodularity.  Extremities   Loss of most of the nailbed of her upper fingers. No edema of the lower extremities no clubbing appreciated.  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 The recommendation is for wide local excision that will encompass all 3 lesions and will be able to definitively evaluate for microinvasive disease. The patient is aware of the risks and benefits of this procedure infection bleeding and wound breakdown. Given her substantive tobacco use she is aware that this risk is increased.  Given the significant emphysema, she has been cleared by pulmonary.    Jona Erkkila A., MD 03/02/2013, 1:35 PM

## 2013-03-02 NOTE — Anesthesia Postprocedure Evaluation (Signed)
  Anesthesia Post-op Note  Patient: [name]  Procedure(s) Performed: Procedure(s) (LRB): WIDE LOCAL EXCISION VULVAR (N/A)  Patient Location: PACU  Anesthesia Type: General  Level of Consciousness: awake and alert   Airway and Oxygen Therapy: Patient Spontanous Breathing  Post-op Pain: mild  Post-op Assessment: Post-op Vital signs reviewed, Patient's Cardiovascular Status Stable, Respiratory Function Stable, Patent Airway and No signs of Nausea or vomiting  Last Vitals:  Filed Vitals:   03/02/13 1515  BP: 176/80  Pulse: 65  Temp:   Resp: 18    Post-op Vital Signs: stable   Complications: No apparent anesthesia complications

## 2013-03-03 ENCOUNTER — Telehealth: Payer: Self-pay | Admitting: Internal Medicine

## 2013-03-03 ENCOUNTER — Encounter (HOSPITAL_COMMUNITY): Payer: Self-pay | Admitting: Gynecologic Oncology

## 2013-03-03 MED ORDER — NICOTINE 21 MG/24HR TD PT24: 21.0000 mg | MEDICATED_PATCH | Freq: Every day | TRANSDERMAL | Status: DC

## 2013-03-03 MED ORDER — LABETALOL HCL 5 MG/ML IV SOLN
20.0000 mg | INTRAVENOUS | Status: DC | PRN
  Filled 2013-03-03: qty 4

## 2013-03-03 MED ORDER — NICOTINE 21 MG/24HR TD PT24
21.0000 mg | MEDICATED_PATCH | Freq: Every day | TRANSDERMAL | Status: DC
  Administered 2013-03-03: 21 mg via TRANSDERMAL
  Filled 2013-03-03: qty 1

## 2013-03-03 MED ORDER — HYDRALAZINE HCL 20 MG/ML IJ SOLN
5.0000 mg | Freq: Four times a day (QID) | INTRAMUSCULAR | Status: DC | PRN
  Administered 2013-03-03: 5 mg via INTRAVENOUS
  Filled 2013-03-03: qty 1

## 2013-03-03 MED ORDER — HYDROMORPHONE HCL PF 1 MG/ML IJ SOLN
0.5000 mg | Freq: Once | INTRAMUSCULAR | Status: AC
  Administered 2013-03-03: 0.5 mg via INTRAVENOUS
  Filled 2013-03-03: qty 1

## 2013-03-03 MED ORDER — MORPHINE SULFATE 2 MG/ML IJ SOLN
2.0000 mg | Freq: Once | INTRAMUSCULAR | Status: AC
  Administered 2013-03-03: 2 mg via INTRAVENOUS
  Filled 2013-03-03: qty 1

## 2013-03-03 NOTE — Discharge Summary (Signed)
Physician Discharge Summary  Patient ID: [name] MRN: [id number] DOB/AGE: [birth year] 66 y.o.  Admit date: 03/02/2013 Discharge date: 03/03/2013  Admission Diagnoses: VIN III (vulvar intraepithelial neoplasia III)  Discharge Diagnoses:  Principal Problem:   VIN III (vulvar intraepithelial neoplasia III)  Discharged Condition:  The patient is in good condition and stable for discharge.  Hospital Course:  On 03/02/2013, the patient underwent the following: Procedure(s):  WIDE LOCAL EXCISION VULVAR.  The postoperative course was uneventful.  She was discharged to home on postoperative day 1 tolerating a regular diet.  Consults: None  Significant Diagnostic Studies: None  Treatments: surgery: see above  Discharge Exam: Blood pressure 186/88, pulse 69, temperature 98.5 F (36.9 C), temperature source Oral, resp. rate 18, SpO2 95.00%. General appearance: alert, cooperative, distracted, no distress and anxious Resp: scattered wheezes bilaterally, cleared with coughing Cardio: regular rate and rhythm, S1, S2 normal, no murmur, click, rub or gallop GI: soft, non-tender; bowel sounds normal; no masses,  no organomegaly Extremities: extremities normal, atraumatic, no cyanosis or edema Incision/Wound: Incision to the vulva intact with no bleeding or drainage noted  Disposition: 01-Home or Self Care      Discharge Orders   Future Appointments Provider Department Dept Phone   03/17/2013 11:30 AM Nyoka Cowden, MD [facility] [phone]   03/25/2013 3:00 PM Laurette Schimke, MD [facility] [phone]   Future Orders Complete By Expires     Call MD for:  difficulty breathing, headache or visual disturbances  As directed     Call MD for:  extreme fatigue  As directed     Call MD for:  hives  As directed     Call MD for:  persistant dizziness or light-headedness  As directed     Call MD for:  persistant nausea and vomiting  As directed      Call MD for:  redness, tenderness, or signs of infection (pain, swelling, redness, odor or green/yellow discharge around incision site)  As directed     Call MD for:  severe uncontrolled pain  As directed     Call MD for:  temperature >100.4  As directed     Diet - low sodium heart healthy  As directed     Discharge instructions  As directed     Comments:      Perineal care with sitz baths as discussed    Discharge wound care:  As directed     Comments:      Sitz baths as discussed    Driving Restrictions  As directed     Comments:      No driving while taking narcotics    Increase activity slowly  As directed     Lifting restrictions  As directed     Comments:      No lifting >15#        Medication List    STOP taking these medications       lidocaine 2 % jelly  Commonly known as:  XYLOCAINE      TAKE these medications       ALPRAZolam 1 MG tablet  Commonly known as:  XANAX  Take 1 mg by mouth 4 (four) times daily.     budesonide-formoterol 160-4.5 MCG/ACT inhaler  Commonly known as:  SYMBICORT  Inhale 2 puffs into the lungs 2 (two) times daily as needed.     chlorzoxazone 500 MG tablet  Commonly known as:  PARAFON  Take 500  mg by mouth 4 (four) times daily as needed for muscle spasms.     diphenhydrAMINE 25 mg capsule  Commonly known as:  BENADRYL  Take 25 mg by mouth 2 (two) times daily.     guaiFENesin 600 MG 12 hr tablet  Commonly known as:  MUCINEX  Take 1,200 mg by mouth 2 (two) times daily.     HYDROcodone-acetaminophen 7.5-500 MG per tablet  Commonly known as:  LORTAB  Take 1 tablet by mouth. TAKES ONE EVERY AM AND 1/2 EVERY AFTERNOON AND 1 AT BEDTIME     HYDROcodone-acetaminophen 10-325 MG per tablet  Commonly known as:  NORCO  Take 1 tablet by mouth every 6 (six) hours as needed for pain.     lisinopril 20 MG tablet  Commonly known as:  PRINIVIL,ZESTRIL  Take 20 mg by mouth every morning.     nebivolol 10 MG tablet  Commonly known as:   BYSTOLIC  Take 10 mg by mouth every morning.     predniSONE 10 MG tablet  Commonly known as:  DELTASONE  Take 4 tablets a day for 2 days, 2 tablets a day for 2 days, 1 tablet a day for 2 days then stop.     sertraline 100 MG tablet  Commonly known as:  ZOLOFT  Take 200 mg by mouth daily.     trazodone 300 MG tablet  Commonly known as:  DESYREL  Take 300 mg by mouth at bedtime.     TUDORZA PRESSAIR 400 MCG/ACT Aepb  Generic drug:  Aclidinium Bromide  Inhale 1 puff into the lungs 2 (two) times daily.       Follow-up Information   Follow up with Laurette Schimke, MD PHD On 03/25/2013.   Contact information:   [address] [place] [address] [phone]       Signed: CROSS, MELISSA DEAL 03/03/2013, 11:01 AM

## 2013-03-03 NOTE — Progress Notes (Signed)
Hypertension   Hydralazine ordered for prn use

## 2013-03-03 NOTE — Progress Notes (Signed)
The patient requesting nicotine patch for smoking cessation.   Patch ordered.

## 2013-03-03 NOTE — Progress Notes (Signed)
Headache   Morphine ordered

## 2013-03-03 NOTE — Telephone Encounter (Signed)
Pt did not provide a call back #.  I called [facility] switchboard.  Pt is in room 1504.  Phone # is [phone].  Called, spoke with pt.  Pt states she this has been taken care of.  Pt states they did come in an give her IV dilaudid and HA is "now gone."  Pt then voiced further questions regarding her scheduled zoloft and didn't understand why the hospital would not give her this.  I advised she needed to speak with the nurse taking care of her regarding this and regarding any problems or symptoms she may be having while she is in the hospital.  She verbalized understanding of this and voiced no further questions or concerns at this time.  She did requesting I let MW know she is "feeling much better now."  Advised I would send msg to him as FYI.  Will sign off and route to MW as Lorain Childes.

## 2013-03-08 ENCOUNTER — Telehealth: Payer: Self-pay | Admitting: *Deleted

## 2013-03-08 NOTE — Telephone Encounter (Signed)
Patient notified of Path results.  F/U appt 04/15/13 

## 2013-03-09 ENCOUNTER — Telehealth: Payer: Self-pay | Admitting: Internal Medicine

## 2013-03-09 NOTE — Telephone Encounter (Signed)
I spoke with pt and she is requesting a letter to excuse her from a [employer]. She stated it will be 75 ppl there. She stated she can be evicted if she does not show up. She does not want to go around people who may have a cold. She is currently on abx for bronchitis. I advised pt MW was not in today. She stated she will call Dr. Margo Aye and ask his office since MW is not in today. She needed nothing further.

## 2013-03-17 ENCOUNTER — Ambulatory Visit: Payer: Medicare Other | Admitting: Internal Medicine

## 2013-03-25 ENCOUNTER — Ambulatory Visit (HOSPITAL_BASED_OUTPATIENT_CLINIC_OR_DEPARTMENT_OTHER): Admit: 2013-03-25 | Payer: Self-pay | Admitting: Gynecologic Oncology

## 2013-03-25 ENCOUNTER — Encounter: Payer: Self-pay | Admitting: Gynecologic Oncology

## 2013-03-25 ENCOUNTER — Encounter (HOSPITAL_BASED_OUTPATIENT_CLINIC_OR_DEPARTMENT_OTHER): Payer: Self-pay

## 2013-03-25 ENCOUNTER — Ambulatory Visit: Payer: Medicare Other | Attending: Gynecologic Oncology | Admitting: Gynecologic Oncology

## 2013-03-25 ENCOUNTER — Ambulatory Visit: Payer: Medicare Other | Admitting: Internal Medicine

## 2013-03-25 VITALS — BP 150/80 | HR 64 | Temp 98.7°F | Resp 18 | Ht 64.96 in | Wt 160.6 lb

## 2013-03-25 DIAGNOSIS — D071 Carcinoma in situ of vulva: Secondary | ICD-10-CM | POA: Insufficient documentation

## 2013-03-25 SURGERY — WIDE EXCISION VULVECTOMY
Anesthesia: Choice

## 2013-03-25 NOTE — Progress Notes (Signed)
Post op Note: Gyn-Onc   CC:  Chief Complaint  Patient presents with  . VIN III    Follow up    HPI: This is a 67 year old gravida 1 para 1 who initially presented to Dr. Lily Peer with complaints of vaginal bleeding and hot flashes. On evaluation she was noted to have a vulvar lesion and biopsies were collected. The specimen dated 12/16/2012 was consistent with at least high-grade vulvar intraepithelial neoplasia. The patient reports that a mass is been present on her vulva for approximately 2 years. Was initially treated with a cream however bleeding did not begin until October of 2013.  On 03/02/2013 she underwent wide local excision of the right vulva. Final pathology was consistent with a high-grade vulvar intraepithelial neoplasia margins not involved there is no evidence of microinvasive carcinoma.    Past Surgical Hx:  Past Surgical History  Procedure Laterality Date  . Carpal tunnel release      BILATERAL  . Nasal sinus surgery    . Lumbar fusion      L4-S1  . Cervical fusion    . Hernia repair    . Salpingoophorectomy Right   . Vaginal hysterectomy      TAH w/ ovary removal  . Transthoracic echocardiogram  12-03-2011    LVSF NORMAL/ EF 60-65%  . Vulvectomy N/A 03/02/2013    Procedure: WIDE LOCAL EXCISION VULVAR;  Surgeon: Rejeana Brock A. Duard Brady, MD;  Location: [facility];  Service: Gynecology;  Laterality: N/A;    Past Medical Hx:  Past Medical History  Diagnosis Date  . H/O: stroke 2012, 2000,     X3  . Hypertension   . Anxiety   . PTSD (post-traumatic stress disorder)   . Depression   . SVD (spontaneous vaginal delivery)     x 1  . COPD (chronic obstructive pulmonary disease)   . Smoker   . Osteoarthritis     knees, hips hands,   . Family history of anesthesia complication     equipment failure  . Shortness of breath   . Stroke     residual tremors  . Pain     SEVERE PAIN BACK, RIGHT HIP AND KNEES--HARRINGTON RODS AND CERVICAL PLATES--USES WALKER OR CANE WHEN  AMBULATING - GOES TO A PAIN CLINIC-STATES SHE NEEDS RT HIP AND BOTH KNEES REPLACED. PT STATES SHE WAS TOLD THE CEMENT AROUND THE HARRINGTON RODS IS CRACKED.  [name] Hot flashes, menopausal     SEVERE    Past Gynecological History:  Gravida 1 para 1 menarche at the age of 7 regular menses until vaginal hysterectomy for menorrhagia in her 30s No LMP recorded. Patient has had a hysterectomy.  Family Hx:  Family History  Problem Relation Age of Onset  . Cancer Mother     COLON  . Heart disease Father   . Diabetes Maternal Aunt   . Cancer Maternal Grandfather     KIDNEY   . Diabetes Maternal Grandfather   . Hypertension Maternal Grandfather   . Heart disease Maternal Grandfather     Review of Systems:  Constitutional  Feels well,  denies fever or chills reports increased appetite 36 pound weight gain Gastro Intestinal  No nausea, vomitting, or diarrhoea. No bright red blood per rectum, no abdominal pain, change in bowel movement, or constipation.  Genito Urinary  No frequency, urgency, dysuria, No depression, anxiety, insomnia.   Vitals:  Blood pressure 150/80, pulse 64, temperature 98.7 F (37.1 C), temperature source Oral, resp. rate 18, height 5' 4.96" (1.65  m), weight 160 lb 9.6 oz (72.848 kg).  Physical Exam: WD in NAD Lymph Node Survey No cervical supraclavicular or inguinal adenopathy Abdomen  Normoactive bowel sounds, abdomen soft, non-tender and thin no palpable masses Genito Urinary  Vulva/vagina: Right labial incision visible sutures are intact lymph node lymphangitic streaking minimal erythema .  Assessment/Plan:  Ms. 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   She is status post wide local excision of the involved area. 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  Have requested that she continue the wonderful perineal care and followup in 3-4 months.  Laurette Schimke, MD, PhD 03/25/2013, 4:04 PM

## 2013-03-25 NOTE — Patient Instructions (Addendum)
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  Please continue the wonderful perineal care and followup in 3-4 months.   Thank you very much [name] for allowing me to provide care for you today.  I appreciate your confidence in choosing our Gynecologic Oncology team.  If you have any questions about your visit today please call our office and we will get back to you as soon as possible.  [name]. Nyrie Sigal MD., PhD Gynecologic Oncology

## 2013-04-15 ENCOUNTER — Other Ambulatory Visit (HOSPITAL_COMMUNITY)
Admission: RE | Admit: 2013-04-15 | Discharge: 2013-04-15 | Disposition: A | Discharge status: Home / Self Care | Payer: Medicare Other | Source: Ambulatory Visit | Attending: Gynecologic Oncology | Admitting: Gynecologic Oncology

## 2013-04-15 ENCOUNTER — Encounter: Payer: Self-pay | Admitting: Gynecologic Oncology

## 2013-04-15 ENCOUNTER — Ambulatory Visit: Payer: Medicare Other | Attending: Gynecologic Oncology | Admitting: Gynecologic Oncology

## 2013-04-15 VITALS — BP 140/74 | HR 68 | Temp 98.5°F | Resp 18 | Ht 64.96 in | Wt 160.6 lb

## 2013-04-15 DIAGNOSIS — N952 Postmenopausal atrophic vaginitis: Secondary | ICD-10-CM | POA: Insufficient documentation

## 2013-04-15 DIAGNOSIS — Z01419 Encounter for gynecological examination (general) (routine) without abnormal findings: Secondary | ICD-10-CM | POA: Insufficient documentation

## 2013-04-15 DIAGNOSIS — Z90710 Acquired absence of both cervix and uterus: Secondary | ICD-10-CM | POA: Insufficient documentation

## 2013-04-15 DIAGNOSIS — D071 Carcinoma in situ of vulva: Secondary | ICD-10-CM | POA: Insufficient documentation

## 2013-04-15 DIAGNOSIS — L293 Anogenital pruritus, unspecified: Secondary | ICD-10-CM | POA: Insufficient documentation

## 2013-04-15 NOTE — Progress Notes (Signed)
Office Visit Note: Gyn-Onc   CC:  Chief Complaint  Patient presents with  . VIN III    Follow up    Assessment/Plan:  [name]  is a 67 y.o.  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  She is status post wide local excision of the involved area. 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 Margins were noted to be negative.   [name] reports significant vulvar pruritis.   Ascetic acid was placed and acetowhite changes were noted in the area of the inferior right labia majora the posterior fourchette. A biopsy was collected.  The patient's major concern at this time is the intense pruritus. If the biopsy is consistent with VIN I would recommend CO2 laser. I offered her the use of Aldara as an alternative but indicated the patient that it would take a long time to effect change. We will contact her with the results of the biopsy  HPI: This is a 67 year old gravida 1 para 1 who initially presented to Dr. Lily [name] with complaints of vaginal bleeding and hot flashes. On evaluation she was noted to have a vulvar lesion and biopsies were collected. The specimen dated 12/16/2012 was consistent with at least high-grade vulvar intraepithelial neoplasia. The patient reports that a mass is been present on her vulva for approximately 2 years. Was initially treated with a cream however bleeding did not begin until October of 2013.  On 03/02/2013 she underwent wide local excision of the right vulva. Final pathology was consistent with a high-grade vulvar intraepithelial neoplasia margins not involved there is no evidence of microinvasive carcinoma.  She presents today with complaints of severe pruritus over the last few weeks in the area of the right labia majora. She also reports vaginal bleeding lasting 3 days. History is remarkable for remote hysterectomy.  Past Surgical Hx:  Past Surgical History  Procedure Laterality Date  . Carpal tunnel release      BILATERAL  . Nasal sinus surgery    .  Lumbar fusion      L4-S1  . Cervical fusion    . Hernia repair    . Salpingoophorectomy Right   . Vaginal hysterectomy      TAH w/ ovary removal  . Transthoracic echocardiogram  12-03-2011    LVSF NORMAL/ EF 60-65%  . Vulvectomy N/A 03/02/2013    Procedure: WIDE LOCAL EXCISION VULVAR;  Surgeon: Gabriela Brock A. Duard Brady, MD;  Location: [facility];  Service: Gynecology;  Laterality: N/A;    Past Medical Hx:  Past Medical History  Diagnosis Date  . H/O: stroke 2012, 2000,     X3  . Hypertension   . Anxiety   . PTSD (post-traumatic stress disorder)   . Depression   . SVD (spontaneous vaginal delivery)     x 1  . COPD (chronic obstructive pulmonary disease)   . Smoker   . Osteoarthritis     knees, hips hands,   . Family history of anesthesia complication     equipment failure  . Shortness of breath   . Stroke     residual tremors  . Pain     SEVERE PAIN BACK, RIGHT HIP AND KNEES--HARRINGTON RODS AND CERVICAL PLATES--USES WALKER OR CANE WHEN AMBULATING - GOES TO A PAIN CLINIC-STATES SHE NEEDS RT HIP AND BOTH KNEES REPLACED. PT STATES SHE WAS TOLD THE CEMENT AROUND THE HARRINGTON RODS IS CRACKED.  [name] Hot flashes, menopausal     SEVERE    Past Gynecological History:  [name]  1 para 1 menarche at the age of 40 regular menses until vaginal hysterectomy for menorrhagia in her 30s No LMP recorded. Patient has had a hysterectomy.  Family Hx:  Family History  Problem Relation Age of Onset  . Cancer Mother     COLON  . Heart disease Father   . Diabetes Maternal Aunt   . Cancer Maternal Grandfather     KIDNEY   . Diabetes Maternal Grandfather   . Hypertension Maternal Grandfather   . Heart disease Maternal Grandfather     Review of Systems:  Constitutional  Feels well,  denies fever or chills reports increased appetite reports reduction and tobacco use. Gastro Intestinal  No nausea, vomitting, or diarrhoea. No bright red blood per rectum, no abdominal pain, change in bowel movement, or  constipation.  Genito Urinary  No frequency, urgency, dysuria, reports vaginal spotting 3 days ago.  Reports severe itching of the external genitalia that's not ameliorated with the use of corticosteroid cream or Benadryl.  No depression, anxiety, insomnia.   Vitals:  Blood pressure 140/74, pulse 68, temperature 98.5 F (36.9 C), temperature source Oral, resp. rate 18, height 5' 4.96" (1.65 m), weight 160 lb 9.6 oz (72.848 kg).  Physical Exam: WD in NAD Lymph Node Survey No cervical supraclavicular or inguinal adenopathy Abdomen  Normoactive bowel sounds, abdomen soft, non-tender and thin no palpable masses Genito Urinary  Vulva/vagina: Bartholin's urethra and Skene's glands were noted to be within normal limits.  Ascetic acid was applied to the external genitalia. Acetowhite changes were noted at the posterior fourchette and also at the area of the right inferior labia majora. The area of the right inferior labia majora was anesthetized and a biopsy collected. Hemostasis was achieved with the use of silver nitrate. The vagina was noted to be markedly atrophic no gross lesions were appreciated. A vaginal Pap was collected. No pelvic masses are appreciated Rectal examination;an inferior noninflamed hemorrhoid was appreciated     Gabriela Schimke, MD, PhD 04/15/2013, 4:32 PM

## 2013-04-15 NOTE — Patient Instructions (Signed)
We will contact her with the results of the vulvar biopsy. If there is evidence of recurrent VIN WE will likely recommend CO2 laser of the effected area. Congratulations on the reduction of the use of tobacco.    Thank you very much [name] for allowing me to provide care for you today.  I appreciate your confidence in choosing our Gynecologic Oncology team.  If you have any questions about your visit today please call our office and we will get back to you as soon as possible.  [name]. Zuleica Seith MD., PhD Gynecologic Oncology

## 2013-04-19 ENCOUNTER — Encounter: Payer: Self-pay | Admitting: Gynecologic Oncology

## 2013-05-11 ENCOUNTER — Encounter (HOSPITAL_BASED_OUTPATIENT_CLINIC_OR_DEPARTMENT_OTHER): Payer: Self-pay | Admitting: *Deleted

## 2013-05-11 NOTE — Progress Notes (Signed)
To [facility] at 0615-Istat on arrival,Ekg,Cxr in epic-instructed to take atenolol,xanax,zoloft,use tudozza inhaler and bring rescue inhaler that am.may take pain medication if needed with small amt water.Orders pending from Dr Nelly Rout.

## 2013-05-12 NOTE — H&P (Signed)
Pre op  Note: Gyn-Onc  Consult was requested by Dr. Phill Mutter the evaluation of [name] 67 y.o. female   CC:   VIN III  Assessment/Plan:  [name] is a 67 y.o. 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 The recommendation is for wide local excision that will encompass all 3 lesions and will be able to definitively evaluate for microinvasive disease. The patient is aware of the risks and benefits of this procedure infection bleeding and wound breakdown. Given her substantive tobacco use she is aware that this risk is increased. Previous surgery was cancelled for elevated BP.  Patient represents for [facility] 05/13/2013.   HPI: This is a 67 y.o.  gravida 1 para 1 who initially presented to Dr. Lily [name] with complaints of vaginal bleeding and hot flashes. On evaluation she was noted to have a vulvar lesion and biopsies were collected. The specimen dated 12/16/2012 was consistent with at least high-grade vulvar intraepithelial neoplasia. The patient reports that a mass is been present on her vulva for approximately 2 years. Was initially treated with a cream however bleeding did not begin until October of 2013.  The patient presents for [facility]LE.  Past Surgical Hx:  Past Surgical History   Procedure  Date   .  Oophorectomy      RIGHT   .  Carpal tunnel release      BILATERAL   .  Neck fusion    .  Nasal sinus surgery    .  Abdominal hysterectomy      TAH w/ ovary removal   .  Lumbar fusion      L4-S1   .  Cervical fusion      Neck   .  Hernia repair      Past Medical Hx:  Past Medical History   Diagnosis  Date   .  H/O: stroke  2012, 2000,     X3   .  Hypertension    .  Anxiety    .  PTSD (post-traumatic stress disorder)    .  Depression    .  PONV (postoperative nausea and vomiting)    .  SVD (spontaneous vaginal delivery)      x 1   .  COPD (chronic obstructive pulmonary disease)    .  Asthma    .  Smoker    .  Osteoarthritis      knees, hips hands,   .  Stroke      x 3  strokes Dr [name]; Dr [name], [name]    Past Gynecological History: Gravida 1 para 1 menarche at the age of 13 regular menses until vaginal hysterectomy for menorrhagia in her 63s   Family Hx:  Family History   Problem  Relation  Age of Onset   .  Cancer  Mother       COLON    .  Heart disease  Father    .  Diabetes  Maternal Aunt    .  Cancer  Maternal Grandfather       KIDNEY    .  Diabetes  Maternal Grandfather    .  Hypertension  Maternal Grandfather    .  Heart disease  Maternal Grandfather    Review of Systems:  Constitutional  Feels well, denies fever or chills reports increased appetite 36 pound weight gain  Cardiovascular  No chest pain, shortness of breath, or edema  Pulmonary  No cough ports wheezing from her long-standing emphysema  has decreased her tobacco use from 1-1/2 packs per day to 10 cigarettes per day  Gastro Intestinal  No nausea, vomitting, or diarrhoea. No bright red blood per rectum, no abdominal pain, change in bowel movement, or constipation.  Genito Urinary  No frequency, urgency, dysuria, reports vaginal bleeding since October 2013  Musculo Skeletal  No myalgia, arthralgia, joint swelling or pain  Neurologic  No weakness, numbness, change in gait,  Psychology  No depression, anxiety, insomnia.   Vitals: Ht 5' 4.5" (1.638 m)  Wt 160 lb (72.576 kg)  BMI 27.05 kg/m2  Physical Exam:  WD in NAD  Neck  Supple NROM, without any enlargements.  Lymph Node Survey  No cervical supraclavicular or inguinal adenopathy  Cardiovascular  Pulse normal rate, regularity and rhythm. S1 and S2 normal.  Lungs  Clear to auscultation bilateraly, hyperinflation bilaterally  Skin  Very thick facial skin. Rate down of nails almost consistent with a fungal infection however the patient states that in there since childhood  Psychiatry  Alert and oriented to person, place, and time  Abdomen  Normoactive bowel sounds, abdomen soft, non-tender and thin no  palpable masses  Back  No CVA tenderness  Genito Urinary  Vulva/vagina: External genitalia is notable for lesions on the inner aspect of the right labia minora and majora. There is 3 lesions one the most inferior measuring 7 mm in the superior lesions measuring 3 and 4 mm. These are all within the 2 cm diameter. No kissing lesions are appreciated.  Vagina without any evidence of discharge or bleeding.  Adnexa: No palpable masses.  Rectal  Good tone, no masses no cul de sac nodularity.  Extremities  Loss of most of the nailbed of her upper fingers. No edema of the lower extremities no clubbing appreciated.

## 2013-05-13 ENCOUNTER — Ambulatory Visit (HOSPITAL_BASED_OUTPATIENT_CLINIC_OR_DEPARTMENT_OTHER): Payer: Medicare Other | Admitting: Anesthesiology

## 2013-05-13 ENCOUNTER — Ambulatory Visit (HOSPITAL_BASED_OUTPATIENT_CLINIC_OR_DEPARTMENT_OTHER)
Admission: RE | Admit: 2013-05-13 | Discharge: 2013-05-13 | Disposition: A | Discharge status: Home / Self Care | Payer: Medicare Other | Source: Ambulatory Visit | Attending: Gynecologic Oncology | Admitting: Gynecologic Oncology

## 2013-05-13 ENCOUNTER — Encounter (HOSPITAL_BASED_OUTPATIENT_CLINIC_OR_DEPARTMENT_OTHER)
Admission: RE | Disposition: A | Discharge status: Home / Self Care | Payer: Self-pay | Source: Ambulatory Visit | Attending: Gynecologic Oncology

## 2013-05-13 ENCOUNTER — Encounter: Payer: Self-pay | Admitting: *Deleted

## 2013-05-13 ENCOUNTER — Encounter (HOSPITAL_BASED_OUTPATIENT_CLINIC_OR_DEPARTMENT_OTHER): Payer: Self-pay | Admitting: Anesthesiology

## 2013-05-13 ENCOUNTER — Encounter (HOSPITAL_BASED_OUTPATIENT_CLINIC_OR_DEPARTMENT_OTHER): Payer: Self-pay | Admitting: *Deleted

## 2013-05-13 DIAGNOSIS — J4489 Other specified chronic obstructive pulmonary disease: Secondary | ICD-10-CM | POA: Insufficient documentation

## 2013-05-13 DIAGNOSIS — G8929 Other chronic pain: Secondary | ICD-10-CM | POA: Insufficient documentation

## 2013-05-13 DIAGNOSIS — F419 Anxiety disorder, unspecified: Secondary | ICD-10-CM

## 2013-05-13 DIAGNOSIS — F329 Major depressive disorder, single episode, unspecified: Secondary | ICD-10-CM | POA: Insufficient documentation

## 2013-05-13 DIAGNOSIS — Z800 Family history of malignant neoplasm of digestive organs: Secondary | ICD-10-CM | POA: Insufficient documentation

## 2013-05-13 DIAGNOSIS — Z809 Family history of malignant neoplasm, unspecified: Secondary | ICD-10-CM | POA: Insufficient documentation

## 2013-05-13 DIAGNOSIS — J449 Chronic obstructive pulmonary disease, unspecified: Secondary | ICD-10-CM | POA: Insufficient documentation

## 2013-05-13 DIAGNOSIS — F3289 Other specified depressive episodes: Secondary | ICD-10-CM | POA: Insufficient documentation

## 2013-05-13 DIAGNOSIS — F17200 Nicotine dependence, unspecified, uncomplicated: Secondary | ICD-10-CM | POA: Insufficient documentation

## 2013-05-13 DIAGNOSIS — F4310 Post-traumatic stress disorder, unspecified: Secondary | ICD-10-CM | POA: Insufficient documentation

## 2013-05-13 DIAGNOSIS — I10 Essential (primary) hypertension: Secondary | ICD-10-CM | POA: Insufficient documentation

## 2013-05-13 DIAGNOSIS — M1990 Unspecified osteoarthritis, unspecified site: Secondary | ICD-10-CM | POA: Insufficient documentation

## 2013-05-13 DIAGNOSIS — Z79899 Other long term (current) drug therapy: Secondary | ICD-10-CM | POA: Insufficient documentation

## 2013-05-13 DIAGNOSIS — Z981 Arthrodesis status: Secondary | ICD-10-CM | POA: Insufficient documentation

## 2013-05-13 DIAGNOSIS — M549 Dorsalgia, unspecified: Secondary | ICD-10-CM | POA: Insufficient documentation

## 2013-05-13 DIAGNOSIS — D071 Carcinoma in situ of vulva: Secondary | ICD-10-CM | POA: Insufficient documentation

## 2013-05-13 DIAGNOSIS — Z8673 Personal history of transient ischemic attack (TIA), and cerebral infarction without residual deficits: Secondary | ICD-10-CM | POA: Insufficient documentation

## 2013-05-13 DIAGNOSIS — F411 Generalized anxiety disorder: Secondary | ICD-10-CM | POA: Insufficient documentation

## 2013-05-13 HISTORY — PX: CO2 LASER APPLICATION: SHX5778

## 2013-05-13 HISTORY — DX: Adverse effect of unspecified anesthetic, initial encounter: T41.45XA

## 2013-05-13 LAB — POCT I-STAT 4, (NA,K, GLUC, HGB,HCT)
Glucose, Bld: 91 mg/dL (ref 70–99)
Potassium: 3.4 mEq/L — ABNORMAL LOW (ref 3.5–5.1)

## 2013-05-13 SURGERY — CO2 LASER APPLICATION
Anesthesia: General | Site: Vulva

## 2013-05-13 MED ORDER — LACTATED RINGERS IV SOLN
INTRAVENOUS | Status: DC
  Administered 2013-05-13: 100 mL/h via INTRAVENOUS
  Administered 2013-05-13: 09:00:00 via INTRAVENOUS
  Filled 2013-05-13: qty 1000

## 2013-05-13 MED ORDER — STERILE WATER FOR IRRIGATION IR SOLN
Status: DC | PRN
  Administered 2013-05-13: 1000 mL

## 2013-05-13 MED ORDER — FENTANYL CITRATE 0.05 MG/ML IJ SOLN
25.0000 ug | INTRAMUSCULAR | Status: DC | PRN
  Administered 2013-05-13: 25 ug via INTRAVENOUS
  Filled 2013-05-13: qty 1

## 2013-05-13 MED ORDER — DEXAMETHASONE SODIUM PHOSPHATE 4 MG/ML IJ SOLN
INTRAMUSCULAR | Status: DC | PRN
  Administered 2013-05-13: 10 mg via INTRAVENOUS

## 2013-05-13 MED ORDER — LIDOCAINE HCL (CARDIAC) 20 MG/ML IV SOLN
INTRAVENOUS | Status: DC | PRN
  Administered 2013-05-13: 60 mg via INTRAVENOUS

## 2013-05-13 MED ORDER — ACETIC ACID 5 % SOLN
Status: DC | PRN
  Administered 2013-05-13: 1 via TOPICAL

## 2013-05-13 MED ORDER — SILVER SULFADIAZINE 1 % EX CREA
TOPICAL_CREAM | CUTANEOUS | Status: DC | PRN
  Administered 2013-05-13: 1 via TOPICAL

## 2013-05-13 MED ORDER — ONDANSETRON HCL 4 MG/2ML IJ SOLN
INTRAMUSCULAR | Status: DC | PRN
  Administered 2013-05-13: 4 mg via INTRAVENOUS

## 2013-05-13 MED ORDER — MIDAZOLAM HCL 5 MG/5ML IJ SOLN
INTRAMUSCULAR | Status: DC | PRN
  Administered 2013-05-13: 1 mg via INTRAVENOUS

## 2013-05-13 MED ORDER — HYDROCODONE-ACETAMINOPHEN 10-325 MG PO TABS
1.0000 | ORAL_TABLET | Freq: Four times a day (QID) | ORAL | Status: DC | PRN
  Administered 2013-05-13: 1 via ORAL
  Filled 2013-05-13: qty 1

## 2013-05-13 MED ORDER — LACTATED RINGERS IV SOLN
INTRAVENOUS | Status: DC
  Filled 2013-05-13: qty 1000

## 2013-05-13 MED ORDER — FENTANYL CITRATE 0.05 MG/ML IJ SOLN
INTRAMUSCULAR | Status: DC | PRN
  Administered 2013-05-13 (×2): 50 ug via INTRAVENOUS

## 2013-05-13 MED ORDER — PROMETHAZINE HCL 25 MG/ML IJ SOLN
6.2500 mg | INTRAMUSCULAR | Status: DC | PRN
  Filled 2013-05-13: qty 1

## 2013-05-13 MED ORDER — PROPOFOL 10 MG/ML IV BOLUS
INTRAVENOUS | Status: DC | PRN
  Administered 2013-05-13: 250 mg via INTRAVENOUS

## 2013-05-13 MED ORDER — ACETAMINOPHEN 10 MG/ML IV SOLN
INTRAVENOUS | Status: DC | PRN
  Administered 2013-05-13: 1000 mg via INTRAVENOUS

## 2013-05-13 MED ORDER — GLYCOPYRROLATE 0.2 MG/ML IJ SOLN
INTRAMUSCULAR | Status: DC | PRN
  Administered 2013-05-13: 0.2 mg via INTRAVENOUS

## 2013-05-13 SURGICAL SUPPLY — 47 items
APPLICATOR COTTON TIP 6IN STRL (MISCELLANEOUS) ×3 IMPLANT
BAG DECANTER FOR FLEXI CONT (MISCELLANEOUS) ×2 IMPLANT
BLADE SURG 11 STRL SS (BLADE) IMPLANT
BLADE SURG 15 STRL LF DISP TIS (BLADE) IMPLANT
BLADE SURG 15 STRL SS (BLADE)
CANISTER SUCTION 1200CC (MISCELLANEOUS) IMPLANT
CANISTER SUCTION 2500CC (MISCELLANEOUS) IMPLANT
CATH ROBINSON RED A/P 16FR (CATHETERS) IMPLANT
CLOTH BEACON ORANGE TIMEOUT ST (SAFETY) ×2 IMPLANT
DRAPE LG THREE QUARTER DISP (DRAPES) ×1 IMPLANT
DRAPE UNDERBUTTOCKS STRL (DRAPE) ×1 IMPLANT
DRESSING TELFA 8X3 (GAUZE/BANDAGES/DRESSINGS) ×2 IMPLANT
ELECT BALL LEEP 3MM BLK (ELECTRODE) IMPLANT
ELECT BALL LEEP 5MM RED (ELECTRODE) IMPLANT
ELECT NDL TIP 2.8 STRL (NEEDLE) IMPLANT
ELECT NEEDLE TIP 2.8 STRL (NEEDLE) IMPLANT
ELECT REM PT RETURN 9FT ADLT (ELECTROSURGICAL) ×2
ELECTRODE REM PT RTRN 9FT ADLT (ELECTROSURGICAL) IMPLANT
GAUZE SPONGE 4X4 16PLY XRAY LF (GAUZE/BANDAGES/DRESSINGS) ×1 IMPLANT
GLOVE BIO SURGEON STRL SZ7 (GLOVE) ×1 IMPLANT
GLOVE BIOGEL M STRL SZ7.5 (GLOVE) ×3 IMPLANT
GLOVE INDICATOR 7.0 STRL GRN (GLOVE) ×1 IMPLANT
GLOVE SURG SIGNA 7.5 PF LTX (GLOVE) ×2 IMPLANT
GOWN PREVENTION PLUS LG XLONG (DISPOSABLE) ×2 IMPLANT
GOWN STRL REIN XL XLG (GOWN DISPOSABLE) ×2 IMPLANT
LEGGING LITHOTOMY PAIR STRL (DRAPES) ×1 IMPLANT
NDL SAFETY ECLIPSE 18X1.5 (NEEDLE) ×1 IMPLANT
NDL SPNL 22GX3.5 QUINCKE BK (NEEDLE) ×1 IMPLANT
NEEDLE HYPO 18GX1.5 SHARP (NEEDLE) ×2
NEEDLE SPNL 22GX3.5 QUINCKE BK (NEEDLE) ×2 IMPLANT
NS IRRIG 500ML POUR BTL (IV SOLUTION) IMPLANT
PACK BASIN DAY SURGERY FS (CUSTOM PROCEDURE TRAY) ×2 IMPLANT
PAD OB MATERNITY 4.3X12.25 (PERSONAL CARE ITEMS) ×2 IMPLANT
PAD PREP 24X48 CUFFED NSTRL (MISCELLANEOUS) ×2 IMPLANT
PENCIL BUTTON HOLSTER BLD 10FT (ELECTRODE) ×1 IMPLANT
SCOPETTES 8  STERILE (MISCELLANEOUS) ×1
SCOPETTES 8 STERILE (MISCELLANEOUS) ×2 IMPLANT
SUT VIC AB 2-0 SH 27 (SUTURE) ×4
SUT VIC AB 2-0 SH 27XBRD (SUTURE) ×2 IMPLANT
SYR CONTROL 10ML LL (SYRINGE) ×2 IMPLANT
SYR TB 1ML LL NO SAFETY (SYRINGE) ×2 IMPLANT
TOWEL OR 17X24 6PK STRL BLUE (TOWEL DISPOSABLE) ×5 IMPLANT
TRAY DSU PREP LF (CUSTOM PROCEDURE TRAY) ×1 IMPLANT
TUBE CONNECTING 12X1/4 (SUCTIONS) ×1 IMPLANT
VACUUM HOSE 7/8X10 W/ WAND (MISCELLANEOUS) ×1 IMPLANT
WATER STERILE IRR 500ML POUR (IV SOLUTION) ×3 IMPLANT
YANKAUER SUCT BULB TIP NO VENT (SUCTIONS) IMPLANT

## 2013-05-13 NOTE — Anesthesia Procedure Notes (Signed)
Procedure Name: LMA Insertion Date/Time: 05/13/2013 7:36 AM Performed by: Norva Pavlov Pre-anesthesia Checklist: Patient identified, Emergency Drugs available, Suction available and Patient being monitored Patient Re-evaluated:Patient Re-evaluated prior to inductionOxygen Delivery Method: Circle System Utilized Preoxygenation: Pre-oxygenation with 100% oxygen Intubation Type: IV induction Ventilation: Mask ventilation without difficulty LMA: LMA inserted LMA Size: 4.0 Number of attempts: 1 Airway Equipment and Method: bite block Placement Confirmation: positive ETCO2 Tube secured with: Tape Dental Injury: Teeth and Oropharynx as per pre-operative assessment

## 2013-05-13 NOTE — Transfer of Care (Signed)
Immediate Anesthesia Transfer of Care Note  Patient: [name]  Procedure(s) Performed: Procedure(s) (LRB): LASER APPLICATION OF THE VULVA (N/A)  Patient Location: PACU  Anesthesia Type: General  Level of Consciousness: awake, alert  and oriented  Airway & Oxygen Therapy: Patient Spontanous Breathing and Patient connected to face mask oxygen  Post-op Assessment: Report given to PACU RN and Post -op Vital signs reviewed and stable  Post vital signs: Reviewed and stable  Complications: No apparent anesthesia complications

## 2013-05-13 NOTE — Anesthesia Postprocedure Evaluation (Signed)
  Anesthesia Post-op Note  Patient: [name]  Procedure(s) Performed: Procedure(s): LASER APPLICATION OF THE VULVA (N/A)  Patient Location: PACU  Anesthesia Type:General  Level of Consciousness: awake, oriented and responds to stimulation  Airway and Oxygen Therapy: Patient Spontanous Breathing  Post-op Pain: moderate  Post-op Assessment: Post-op Vital signs reviewed  Post-op Vital Signs: stable  Complications: No apparent anesthesia complications

## 2013-05-13 NOTE — Interval H&P Note (Signed)
History and Physical Interval Note:  05/13/2013 7:22 AM  [name]  has presented today for surgery, with the diagnosis of VIN 3  The various methods of treatment have been discussed with the patient and family. After consideration of risks, benefits and other options for treatment, the patient has consented to  Procedure(s): LASER APPLICATION OF THE VULVA (N/A) WIDE LOCAL EXCISION as a surgical intervention .  The patient's history has been reviewed, patient examined, no change in status, stable for surgery.  I have reviewed the patient's chart and labs.  Questions were answered to the patient's satisfaction.     [place], [facility]

## 2013-05-13 NOTE — Op Note (Signed)
Preoperative Diagnosis: VIN  Postoperative Diagnosis: VIN II  Procedure(s) Performed: CO2 laser of the vulva  Surgeon: [name].  Nelly Rout, M.D. PhD  Anesthesia: Gen. anesthesia  LMA  Operative Findings: Multifocal areas of acetowhite changes of the inferior right posterior fourchette. A 3 mm lesion was appreciated on the right labia majora and a similar kissing lesion on the left labia majora  Procedure: Ms Mencer was  taken to the operating room and timeout procedures occurred. [name] was  then placed under general anesthesia placed in the dorsolithotomy position. Acetic acid was placed over the entire external genitalia to confirm the location of the vulvar dysplasia.  She was then prepped and draped in the usual sterile manner.  Wet towels were placed around the operative site as a standard The CO2 laser was then used at 8 W diameter spot size of 1 mm, continuous pulse and adaptive ablation to the third surgical plane.  The area in the posterior fourchette that was lasered was approximately 2.5 cm in diameter. The areas lasered on the right and left labia majora were approximately 5 mm. Hemostasis was achieved. Silver Silvadene was placed over the operative sites.  Specimens: None  Estimated Blood Loss: 2 mL. Blood Replacement: None  Sponge, lap and needle counts were correct.  Complications: None  The patient had sequential compression devices for VTE prophylaxis          Disposition: PACU - hemodynamically stable.         Condition: Stable

## 2013-05-13 NOTE — Anesthesia Preprocedure Evaluation (Addendum)
Anesthesia Evaluation  Patient identified by MRN, date of birth, ID band Patient awake    Reviewed: Allergy & Precautions, H&P , NPO status , Patient's Chart, lab work & pertinent test results  History of Anesthesia Complications (+) PONV and Family history of anesthesia reaction  Airway Mallampati: II TM Distance: >3 FB Neck ROM: Full    Dental  (+) Poor Dentition, Missing, Chipped, Dental Advisory Given and Loose,    Pulmonary neg pulmonary ROS, shortness of breath and with exertion, COPDCurrent Smoker,  - rhonchi  + decreased breath sounds+ wheezing - stridor     Cardiovascular hypertension, Pt. on medications and Pt. on home beta blockers Rhythm:Regular Rate:Normal     Neuro/Psych Anxiety Depression Chronic back pain; cervical fusion CVA, Residual Symptoms negative neurological ROS  negative psych ROS   GI/Hepatic negative GI ROS, (+)     substance abuse ("Chronic pain" with oral narcotic dependence)   ,   Endo/Other  negative endocrine ROS  Renal/GU negative Renal ROS  negative genitourinary   Musculoskeletal negative musculoskeletal ROS (+)   Abdominal   Peds  Hematology negative hematology ROS (+)   Anesthesia Other Findings States bronchitis two weeks ago; symptoms of SOB and expiratory wheeze worse today. Recent steroid dose pack, last pill yesterday. Patient appears rather lethargic this morning which I suspect is secondary to oral pain medication.  Reproductive/Obstetrics negative OB ROS                          Anesthesia Physical Anesthesia Plan  ASA: III  Anesthesia Plan: General   Post-op Pain Management:    Induction: Intravenous  Airway Management Planned: LMA  Additional Equipment:   Intra-op Plan:   Post-operative Plan: Extubation in OR  Informed Consent: I have reviewed the patients History and Physical, chart, labs and discussed the procedure including the  risks, benefits and alternatives for the proposed anesthesia with the patient or authorized representative who has indicated his/her understanding and acceptance.   Dental advisory given  Plan Discussed with: CRNA  Anesthesia Plan Comments: (Patient states she took oral pain medication this am for chronic pain issues.(Lortab))       Anesthesia Quick Evaluation

## 2013-05-13 NOTE — Progress Notes (Signed)
Reviewed post-op peri care with patient.  She has all her supplies and voices clear understanding of peri care.  Pt will be discharged with a peri bottle and sitz bath.

## 2013-05-14 ENCOUNTER — Telehealth: Payer: Self-pay | Admitting: Gynecologic Oncology

## 2013-05-14 DIAGNOSIS — D071 Carcinoma in situ of vulva: Secondary | ICD-10-CM

## 2013-05-14 DIAGNOSIS — R102 Pelvic and perineal pain: Secondary | ICD-10-CM

## 2013-05-14 MED ORDER — LIDOCAINE HCL 2 % EX GEL: CUTANEOUS | Status: DC | PRN

## 2013-05-14 NOTE — Telephone Encounter (Signed)
Patient reporting feeling "half and half."  Reporting moderate vulvar discomfort with a burning sensation.  Requesting a refill of lidocaine gel be called in since the lidocaine gel seems "to be the only thing that is helping."  Stating that she "bled more this time than the last" but bleeding has decreased.  Using silvadene cream as prescribed.  Reportable signs and symptoms reviewed.  Instructed to call for any questions or concerns.  Informed that a refill would be sent to her pharmacy.

## 2013-05-17 ENCOUNTER — Encounter (HOSPITAL_BASED_OUTPATIENT_CLINIC_OR_DEPARTMENT_OTHER): Payer: Self-pay | Admitting: Gynecologic Oncology

## 2013-05-21 ENCOUNTER — Telehealth: Payer: Self-pay | Admitting: *Deleted

## 2013-05-21 NOTE — Telephone Encounter (Signed)
Patient notified of Path results.  F/U appt 06/22/13 @1500 

## 2013-06-21 ENCOUNTER — Telehealth: Payer: Self-pay | Admitting: Gynecologic Oncology

## 2013-06-21 NOTE — Telephone Encounter (Signed)
Office Visit Note: Gyn-Onc   CC: VIN II  Assessment/Plan:  [name]  is a 67 y.o.  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  [name] is status post Co2 laser of Gabriela affected area.   HPI: This is a [name] who initially presented to Dr. Lily Peer with complaints of vaginal bleeding and hot flashes. On evaluation [name] was noted to have a vulvar lesion and biopsies were collected. Gabriela specimen dated 12/16/2012 was consistent with at least high-grade vulvar intraepithelial neoplasia. [name] reports that a mass is been present on [name] vulva for approximately 2 years. Was initially treated with a cream however bleeding did not begin until October of 2013.  On 03/02/2013 [name] underwent wide local excision of Gabriela right vulva. Final pathology was consistent with a high-grade vulvar intraepithelial neoplasia margins not involved there is no evidence of microinvasive carcinoma.  [name] presented 03/2013 s today with complaints of severe pruritus. Biopsy HIGH GRADE VULVAR INTRAEPITHELIAL NEOPLASIA, VIN-II EXTENDING TO Gabriela MARGINS OF Gabriela SPECIMEN. NO EVIDENCE OF INVASIVE CARCINOMA   On 05/13/2013 [name] underwent Co2 laser.   Past Surgical Hx:  Past Surgical History  Procedure Laterality Date  . Carpal tunnel release      BILATERAL  . Nasal sinus surgery    . Lumbar fusion      L4-S1  . Cervical fusion    . Hernia repair    . Salpingoophorectomy Right   . Vaginal hysterectomy      TAH w/ ovary removal  . Transthoracic echocardiogram  12-03-2011    LVSF NORMAL/ EF 60-65%  . Vulvectomy N/A 03/02/2013    Procedure: WIDE LOCAL EXCISION VULVAR;  Surgeon: Rejeana Brock A. Duard Brady, MD;  Location: [facility];  Service: Gynecology;  Laterality: N/A;  . Co2 laser application N/A 05/13/2013    Procedure: LASER APPLICATION OF Gabriela VULVA;  Surgeon: Laurette Schimke, MD;  Location: [facility];  Service: Gynecology;  Laterality: N/A;    Past Medical Hx:  Past Medical History  Diagnosis Date  .  H/O: stroke 2012, 2000,     X3  . Hypertension   . Anxiety   . PTSD (post-traumatic stress disorder)   . Depression   . SVD (spontaneous vaginal delivery)     x [name]  . Smoker   . Osteoarthritis     knees, hips hands,   . Shortness of breath   . Pain     SEVERE PAIN BACK, RIGHT HIP AND KNEES--HARRINGTON RODS AND CERVICAL PLATES--USES WALKER OR CANE WHEN AMBULATING - GOES TO A PAIN CLINIC-STATES [name] NEEDS RT HIP AND BOTH KNEES REPLACED. PT STATES [name] WAS TOLD Gabriela CEMENT AROUND Gabriela HARRINGTON RODS IS CRACKED.  [name] Hot flashes, menopausal     SEVERE  . Family history of anesthesia complication     equipment failure caused problem  . Complication of anesthesia 03/02/13    severe headache,vertigo postop  . COPD (chronic obstructive pulmonary disease)     stable  . Stroke 2012    residual tremors    Past Gynecological History:  Gravida [name] para [name] menarche at Gabriela age of 55 regular menses until vaginal hysterectomy for menorrhagia in [name] 30s No LMP recorded. [name] has had a hysterectomy.  Family Hx:  Family History  Problem Relation Age of Onset  . Cancer Mother     COLON  . Heart disease Father   . Diabetes Maternal Aunt   . Cancer Maternal Grandfather  KIDNEY   . Diabetes Maternal Grandfather   . Hypertension Maternal Grandfather   . Heart disease Maternal Grandfather     Review of Systems:  Constitutional  Feels well,  denies fever or chills reports increased appetite reports reduction and tobacco use. Gastro Intestinal  No nausea, vomitting, or diarrhoea. No bright red blood per rectum, no abdominal pain, change in bowel movement, or constipation.  Genito Urinary  No frequency, urgency, dysuria, reports vaginal spotting 3 days ago.  Reports severe itching of Gabriela external genitalia that's not ameliorated with Gabriela use of corticosteroid cream or Benadryl.  No depression, anxiety, insomnia.   Vitals:  Blood pressure 140/74, pulse 68, temperature 98.5 F (36.9 C), temperature  source Oral, resp. rate 18, height 5' 4.96" ([name].65 m), weight 160 lb 9.6 oz (72.848 kg).  Physical Exam: WD in NAD Lymph Node Survey No cervical supraclavicular or inguinal adenopathy Abdomen  Normoactive bowel sounds, abdomen soft, non-tender and thin no palpable masses Genito Urinary  Vulva/vagina: Bartholin's urethra and Skene's glands were noted to be within normal limits.      Laurette Schimke, MD, PhD 06/21/2013, 10:02 PM

## 2013-06-22 ENCOUNTER — Ambulatory Visit: Payer: Medicare Other | Attending: Gynecologic Oncology | Admitting: Gynecologic Oncology

## 2013-06-22 VITALS — BP 140/70 | HR 68 | Temp 98.5°F | Resp 18 | Ht 64.96 in | Wt 161.4 lb

## 2013-06-22 DIAGNOSIS — I10 Essential (primary) hypertension: Secondary | ICD-10-CM | POA: Insufficient documentation

## 2013-06-22 DIAGNOSIS — N901 Moderate vulvar dysplasia: Secondary | ICD-10-CM | POA: Insufficient documentation

## 2013-06-22 DIAGNOSIS — D071 Carcinoma in situ of vulva: Secondary | ICD-10-CM

## 2013-06-22 DIAGNOSIS — I69998 Other sequelae following unspecified cerebrovascular disease: Secondary | ICD-10-CM | POA: Insufficient documentation

## 2013-06-22 DIAGNOSIS — J4489 Other specified chronic obstructive pulmonary disease: Secondary | ICD-10-CM | POA: Insufficient documentation

## 2013-06-22 DIAGNOSIS — J449 Chronic obstructive pulmonary disease, unspecified: Secondary | ICD-10-CM | POA: Insufficient documentation

## 2013-06-22 DIAGNOSIS — Z9079 Acquired absence of other genital organ(s): Secondary | ICD-10-CM | POA: Insufficient documentation

## 2013-06-22 DIAGNOSIS — R259 Unspecified abnormal involuntary movements: Secondary | ICD-10-CM | POA: Insufficient documentation

## 2013-06-22 DIAGNOSIS — L293 Anogenital pruritus, unspecified: Secondary | ICD-10-CM | POA: Insufficient documentation

## 2013-06-22 DIAGNOSIS — Z90710 Acquired absence of both cervix and uterus: Secondary | ICD-10-CM | POA: Insufficient documentation

## 2013-06-22 NOTE — Progress Notes (Signed)
Office Visit Note: Gyn-Onc   CC: VIN II  Assessment/Plan:  [name]  is a 67 y.o.  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  She is status post Co2 laser of the affected area. Followup in 6 months   HPI: This is a 67 year old gravida 1 para 1 who initially presented to Dr. Lily Peer with complaints of vaginal bleeding and hot flashes. On evaluation she was noted to have a vulvar lesion and biopsies were collected. The specimen dated 12/16/2012 was consistent with at least high-grade vulvar intraepithelial neoplasia. The patient reports that a mass is been present on her vulva for approximately 2 years. Was initially treated with a cream however bleeding did not begin until October of 2013.  On 03/02/2013 she underwent wide local excision of the right vulva. Final pathology was consistent with a high-grade vulvar intraepithelial neoplasia margins not involved there is no evidence of microinvasive carcinoma.  She presented 03/2013 s today with complaints of severe pruritus. Biopsy HIGH GRADE VULVAR INTRAEPITHELIAL NEOPLASIA, VIN-II EXTENDING TO THE MARGINS OF THE SPECIMEN. NO EVIDENCE OF INVASIVE CARCINOMA   On 05/13/2013 she underwent Co2 laser.   Past Surgical Hx:  Past Surgical History  Procedure Laterality Date  . Carpal tunnel release      BILATERAL  . Nasal sinus surgery    . Lumbar fusion      L4-S1  . Cervical fusion    . Hernia repair    . Salpingoophorectomy Right   . Vaginal hysterectomy      TAH w/ ovary removal  . Transthoracic echocardiogram  12-03-2011    LVSF NORMAL/ EF 60-65%  . Vulvectomy N/A 03/02/2013    Procedure: WIDE LOCAL EXCISION VULVAR;  Surgeon: Rejeana Brock A. Duard Brady, MD;  Location: [facility];  Service: Gynecology;  Laterality: N/A;  . Co2 laser application N/A 05/13/2013    Procedure: LASER APPLICATION OF THE VULVA;  Surgeon: Laurette Schimke, MD;  Location: [facility];  Service: Gynecology;  Laterality: N/A;    Past Medical Hx:  Past Medical  History  Diagnosis Date  . H/O: stroke 2012, 2000,     X3  . Hypertension   . Anxiety   . PTSD (post-traumatic stress disorder)   . Depression   . SVD (spontaneous vaginal delivery)     x 1  . Smoker   . Osteoarthritis     knees, hips hands,   . Shortness of breath   . Pain     SEVERE PAIN BACK, RIGHT HIP AND KNEES--HARRINGTON RODS AND CERVICAL PLATES--USES WALKER OR CANE WHEN AMBULATING - GOES TO A PAIN CLINIC-STATES SHE NEEDS RT HIP AND BOTH KNEES REPLACED. PT STATES SHE WAS TOLD THE CEMENT AROUND THE HARRINGTON RODS IS CRACKED.  [name] Hot flashes, menopausal     SEVERE  . Family history of anesthesia complication     equipment failure caused problem  . Complication of anesthesia 03/02/13    severe headache,vertigo postop  . COPD (chronic obstructive pulmonary disease)     stable  . Stroke 2012    residual tremors    Past Gynecological History:  Gravida 1 para 1 menarche at the age of 89 regular menses until vaginal hysterectomy for menorrhagia in her 30s No LMP recorded. Patient has had a hysterectomy.  Family Hx:  Family History  Problem Relation Age of Onset  . Cancer Mother     COLON  . Heart disease Father   . Diabetes Maternal Aunt   . Cancer Maternal Grandfather  KIDNEY   . Diabetes Maternal Grandfather   . Hypertension Maternal Grandfather   . Heart disease Maternal Grandfather     Review of Systems:  Constitutional  Feels well,  denies fever or chills reports increased appetite reports reduction and tobacco use. Gastro Intestinal  No nausea, vomitting, or diarrhoea. No bright red blood per rectum, no abdominal pain, change in bowel movement, or constipation.  Genito Urinary  No frequency, urgency, dysuria, reports vaginal spotting 3 days ago.  Reports severe itching of the external genitalia that's not ameliorated with the use of corticosteroid cream or Benadryl.  No depression, anxiety, insomnia.   Vitals:  Blood pressure 140/70, pulse 68, temperature  98.5 F (36.9 C), temperature source Oral, resp. rate 18, height 5' 4.96" (1.65 m), weight 161 lb 6.4 oz (73.211 kg).  Physical Exam: WD in NAD Lymph Node Survey No cervical supraclavicular or inguinal adenopathy Abdomen  Normoactive bowel sounds, abdomen soft, non-tender and thin no palpable masses Genito Urinary  Vulva/vagina: Bartholin's urethra and Skene's glands were noted to be within normal limits.  No lesions were appreciated even after placement of ascetic acid    Laurette Schimke, MD, PhD 06/22/2013, 4:08 PM

## 2013-06-22 NOTE — Patient Instructions (Signed)
Followup in 6 months   Thank you very much [name] for allowing me to provide care for you today.  I appreciate your confidence in choosing our Gynecologic Oncology team.  If you have any questions about your visit today please call our office and we will get back to you as soon as possible.  [name]. [name] MD., PhD Gynecologic Oncology

## 2013-08-05 ENCOUNTER — Ambulatory Visit: Payer: Medicare Other | Attending: Gynecologic Oncology | Admitting: Gynecologic Oncology

## 2013-08-05 ENCOUNTER — Encounter: Payer: Self-pay | Admitting: Gynecologic Oncology

## 2013-08-05 VITALS — BP 140/80 | HR 68 | Temp 99.1°F | Resp 18 | Ht 64.96 in | Wt 164.1 lb

## 2013-08-05 DIAGNOSIS — I10 Essential (primary) hypertension: Secondary | ICD-10-CM | POA: Insufficient documentation

## 2013-08-05 DIAGNOSIS — M159 Polyosteoarthritis, unspecified: Secondary | ICD-10-CM | POA: Insufficient documentation

## 2013-08-05 DIAGNOSIS — D071 Carcinoma in situ of vulva: Secondary | ICD-10-CM

## 2013-08-05 DIAGNOSIS — I69998 Other sequelae following unspecified cerebrovascular disease: Secondary | ICD-10-CM | POA: Insufficient documentation

## 2013-08-05 DIAGNOSIS — N900 Mild vulvar dysplasia: Secondary | ICD-10-CM | POA: Insufficient documentation

## 2013-08-05 DIAGNOSIS — R259 Unspecified abnormal involuntary movements: Secondary | ICD-10-CM | POA: Insufficient documentation

## 2013-08-05 DIAGNOSIS — F17200 Nicotine dependence, unspecified, uncomplicated: Secondary | ICD-10-CM | POA: Insufficient documentation

## 2013-08-05 DIAGNOSIS — Z90710 Acquired absence of both cervix and uterus: Secondary | ICD-10-CM | POA: Insufficient documentation

## 2013-08-05 NOTE — Progress Notes (Signed)
Office Visit Note: Gyn-Onc  CC: VIN II  Assessment/Plan: [name]  is a 67 y.o.  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  She is status post Co2 laser of the affected area.  Biopsy of the area between the left labia minora and labia majora taken today and the patient will be notified of the results.  HPI: [name] is a 67 year old gravida 1 para 1 who initially presented to Dr. Lily Peer with complaints of vaginal bleeding and hot flashes. On evaluation, she was noted to have a vulvar lesion and biopsies were collected. The specimen dated 12/16/2012 was consistent with at least high-grade vulvar intraepithelial neoplasia. The patient reports that a mass is been present on her vulva for approximately 2 years. She was initially treated with a cream however bleeding did not begin until October of 2013.  On 03/02/2013 she underwent wide local excision of the right vulva. Final pathology was consistent with a high-grade vulvar intraepithelial neoplasia margins not involved there is no evidence of microinvasive carcinoma.  She presented 03/2013 with complaints of severe pruritus. Biopsy at that time resulted:  HIGH GRADE VULVAR INTRAEPITHELIAL NEOPLASIA, VIN-II EXTENDING TO THE MARGINS OF THE SPECIMEN. NO EVIDENCE OF INVASIVE CARCINOMA.  On 05/13/2013 she underwent Co2 laser.  Interval HPI:  She presents today alone with complaints of moderate vulvar burning, pruritis, and a vulvar sore noted on her left vulva.  She states that she noticed the burning sensation 2 days ago.  She also noted the development of a sore on the left vulva that is tender to touch with no drainage.  She states that she began experiencing moderate pruritis in that area this am.  She reports that she has plans to see a provider at the pain clinic for her chronic pain issues and she continues to smoke 5 to 7 cigarettes daily.  She states that she does not want to be examined until Dr. Nelly Rout is present because she has limited  mobility with both knees and cannot tolerate stir-up placement for a long period of time.      Past Surgical Hx:  Past Surgical History  Procedure Laterality Date  . Carpal tunnel release      BILATERAL  . Nasal sinus surgery    . Lumbar fusion      L4-S1  . Cervical fusion    . Hernia repair    . Salpingoophorectomy Right   . Vaginal hysterectomy      TAH w/ ovary removal  . Transthoracic echocardiogram  12-03-2011    LVSF NORMAL/ EF 60-65%  . Vulvectomy N/A 03/02/2013    Procedure: WIDE LOCAL EXCISION VULVAR;  Surgeon: Rejeana Brock A. Duard Brady, MD;  Location: [facility];  Service: Gynecology;  Laterality: N/A;  . Co2 laser application N/A 05/13/2013    Procedure: LASER APPLICATION OF THE VULVA;  Surgeon: Laurette Schimke, MD;  Location: [facility];  Service: Gynecology;  Laterality: N/A;    Past Medical Hx:  Past Medical History  Diagnosis Date  . H/O: stroke 2012, 2000,     X3  . Hypertension   . Anxiety   . PTSD (post-traumatic stress disorder)   . Depression   . SVD (spontaneous vaginal delivery)     x 1  . Smoker   . Osteoarthritis     knees, hips hands,   . Shortness of breath   . Pain     SEVERE PAIN BACK, RIGHT HIP AND KNEES--HARRINGTON RODS AND CERVICAL PLATES--USES WALKER OR CANE  WHEN AMBULATING - GOES TO A PAIN CLINIC-STATES SHE NEEDS RT HIP AND BOTH KNEES REPLACED. PT STATES SHE WAS TOLD THE CEMENT AROUND THE HARRINGTON RODS IS CRACKED.  [name] Hot flashes, menopausal     SEVERE  . Family history of anesthesia complication     equipment failure caused problem  . Complication of anesthesia 03/02/13    severe headache,vertigo postop  . COPD (chronic obstructive pulmonary disease)     stable  . Stroke 2012    residual tremors    Past Gynecological History:  Gravida 1 para 1 menarche at the age of 23 regular menses until vaginal hysterectomy for menorrhagia in her 30s No LMP recorded. Patient has had a hysterectomy.  Family Hx:  Family History  Problem Relation  Age of Onset  . Cancer Mother     COLON  . Heart disease Father   . Diabetes Maternal Aunt   . Cancer Maternal Grandfather     KIDNEY   . Diabetes Maternal Grandfather   . Hypertension Maternal Grandfather   . Heart disease Maternal Grandfather     Review of Systems: Constitutional: Feels well.  No fever, chills, change in appetite, early satiety.  Cardiovascular: No chest pain, shortness of breath, or edema.  Pulmonary: Intermittent cough and wheeze. "Have had this cold and ear ache for awhile"  Gastrointestinal: No nausea, vomiting, or diarrhea. No bright red blood per rectum or change in bowel movement.  Genitourinary: No frequency, urgency, or dysuria. No vaginal bleeding or discharge.  Musculoskeletal: Moderate bilateral knee pain reported.  Ambulating with a cane. Neurologic: No weakness, numbness, or change in gait.  Psychology: No depression or insomnia.  Intermittent insomnia.  Vitals:  Blood pressure 140/80, pulse 68, temperature 99.1 F (37.3 C), temperature source Oral, resp. rate 18, height 5' 4.96" (1.65 m), weight 164 lb 1.6 oz (74.435 kg).  Physical Exam: General: Well developed, well nourished female in no acute distress. Alert and oriented x 3.  Neck: Supple without any enlargements.  Lymph node survey: No cervical, supraclavicular, or inguinal adenopathy  Cardiovascular: Regular rate and rhythm. S1 and S2 normal.  Lungs: Clear to auscultation bilaterally. No wheezes/crackles/rhonchi noted.  Back: No CVA tenderness.  Abdomen: Abdomen soft, non-tender and non-obese. Active bowel sounds in all quadrants. No evidence of a fluid wave or abdominal masses.  Genitourinary:    Bartholin's urethra and Skene's glands were noted to be within normal limits.  Acetic acid applied to the vulva.  Topical lidocaine applied after area cleansed with betadine.  Lidocaine injected to the site.  Punch biopsy taken of whitened area between the left labia minora and labia majora.        Extremities: No bilateral cyanosis, edema, or clubbing.  Bilateral knee braces on.     Argentina Kosch, Georgiann Mohs, MD, PhD 08/05/2013, 1:09 PM  [name] seen and examined with Warner Mccreedy NP.  I performed the biopsy.   have reviewed and agree with the above.  Rafael Capi, [facility]

## 2013-08-13 ENCOUNTER — Telehealth: Payer: Self-pay | Admitting: Gynecologic Oncology

## 2013-08-13 NOTE — Telephone Encounter (Signed)
Attempted to call patient to discuss biopsy results and to inform her of Dr. Forrestine Him recommendations for regular follow up in six months.  Will retry at a later time.

## 2013-08-13 NOTE — Telephone Encounter (Signed)
Patient informed of Dr. Forrestine Him recommendations for follow up in six months.  Instructed to call for any needs or concerns.

## 2013-08-27 ENCOUNTER — Ambulatory Visit: Payer: Medicare Other | Admitting: Internal Medicine

## 2013-09-01 ENCOUNTER — Encounter: Payer: Self-pay | Admitting: Internal Medicine

## 2013-09-01 ENCOUNTER — Ambulatory Visit (INDEPENDENT_AMBULATORY_CARE_PROVIDER_SITE_OTHER): Payer: Medicare Other | Admitting: Internal Medicine

## 2013-09-01 ENCOUNTER — Ambulatory Visit (INDEPENDENT_AMBULATORY_CARE_PROVIDER_SITE_OTHER)
Admission: RE | Admit: 2013-09-01 | Discharge: 2013-09-01 | Disposition: A | Discharge status: Home / Self Care | Payer: Medicare Other | Source: Ambulatory Visit | Attending: Internal Medicine | Admitting: Internal Medicine

## 2013-09-01 VITALS — BP 130/60 | HR 66 | Temp 99.0°F | Ht 66.0 in | Wt 169.4 lb

## 2013-09-01 DIAGNOSIS — J449 Chronic obstructive pulmonary disease, unspecified: Secondary | ICD-10-CM

## 2013-09-01 DIAGNOSIS — F17200 Nicotine dependence, unspecified, uncomplicated: Secondary | ICD-10-CM

## 2013-09-01 DIAGNOSIS — J4489 Other specified chronic obstructive pulmonary disease: Secondary | ICD-10-CM

## 2013-09-01 MED ORDER — MOMETASONE FURO-FORMOTEROL FUM 200-5 MCG/ACT IN AERO: INHALATION_SPRAY | RESPIRATORY_TRACT | Status: DC

## 2013-09-01 NOTE — Patient Instructions (Addendum)
dulera 200 Take 2 puffs first thing in am and then another 2 puffs about 12 hours later.   Continue todorza one twice daily  The key is to stop smoking completely before smoking completely stops you!  Please remember to go to the  x-ray department downstairs for your tests - we will call you with the results when they are available.  Try nexium 20  Takex 2  30-60 min before first meal of the day and Pepcid 20 mg one bedtime until cough is completely gone for at least a week without the need for cough suppression  GERD (REFLUX)  is an extremely common cause of respiratory symptoms, many times with no significant heartburn at all.    It can be treated with medication, but also with lifestyle changes including avoidance of late meals, excessive alcohol, smoking cessation, and avoid fatty foods, chocolate, peppermint, colas, red wine, and acidic juices such as orange juice.  NO MINT OR MENTHOL PRODUCTS SO NO COUGH DROPS  USE SUGARLESS CANDY INSTEAD (jolley ranchers or Stover's)  NO OIL BASED VITAMINS - use powdered substitutes.     Call Dr Margo Aye or me to arrange Sinus Ct if not better  Please schedule a follow up office visit in 2 weeks, sooner if needed   late add  cxr on return, augmentin 875 bid x 10 days and stop lisinorpil  rx cozaar 50 mg dialy

## 2013-09-01 NOTE — Progress Notes (Signed)
Subjective:    Patient ID: [name], female    DOB: 1946/10/18  MRN: [id number]  HPI  61 yowf active smoker referred 02/10/13 by Dr Nelly Rout for copd preop eval planning vulvectomy   02/10/2013 1st ov/ pulmonary eval on acei cc chronic x years cough and congestion better p tudorza and 3 x weekly neb alb with mucus typically white and thick in am but more yellow x 2-3 days  and more limited by back than breathing chronically. rec zpak x 5 days For cough > mucinex dm 1200 mg every 12 hours Symbicort 160 Take 2 puffs first thing in am and then another 2 puffs about 12 hours later.  Stop lisinopril Start Bystolic 10 mg each am  The key is to stop smoking completely     02/17/2013 f/u ov/[name] cc much better less cough but limited by L hip pain since fell 3 weeks prior to OV  - Dr Renae Fickle is orthopedic, using lortab x 2 to get out of bed each am and no need for proventil  rec Take extra bystolic 10 mg today  Please schedule a follow up office visit in 4 weeks, sooner if needed > did not return as requested   09/01/2013 acute  ov/[name] new onset hemoptysis/ sever cough on ACEi Chief Complaint  Patient presents with  . Acute Visit    Pt c/o hemoptysis on and off x 2 wks- blood was bright red at onset, but is lighter in color now. She also c/o sore throat and ear pain.    not doing as well as for month, already rx augmentin and levaquin > much better with min need for saba   No obvious daytime variabilty or assoc   cp or chest tightness, subjective wheeze overt sinus or hb symptoms. No unusual exp hx or h/o childhood pna/ asthma or premature birth to her knowledge.   Sleeping ok without nocturnal  or early am exacerbation  of respiratory  c/o's or need for noct saba. Also denies any obvious fluctuation of symptoms with weather or environmental changes or other aggravating or alleviating factors except as outlined above   ROS  The following are not active complaints unless bolded sore  throat, dysphagia, dental problems, itching, sneezing,  nasal congestion or excess/ purulent secretions, ear ache,   fever, chills, sweats, unintended wt loss, pleuritic or exertional cp, hemoptysis,  orthopnea pnd or leg swelling, presyncope, palpitations, heartburn, abdominal pain, anorexia, nausea, vomiting, diarrhea  or change in bowel or urinary habits, change in stools or urine, dysuria,hematuria,  rash, arthralgias, visual complaints, headache, numbness weakness or ataxia or problems with walking or coordination,  change in mood/affect or memory.               Objective:   Physical Exam  Wt 02/17/2013  162  > 09/01/2013 169  Wt Readings from Last 3 Encounters:  02/10/13 159 lb (72.122 kg)  01/19/13 156 lb (70.761 kg)  12/31/12 150 lb (68.04 kg)    Relatively thin wf > stated age with no longer  pseudowheeze  HEENT mild turbinate edema.  Oropharynx no thrush or excess pnd or cobblestoning.  No JVD or cervical adenopathy. Mild accessory muscle hypertrophy. Trachea midline, nl thryroid. Chest was hyperinflated by percussion with diminished breath sounds and moderate increased exp time without wheeze. Hoover sign positive at mid inspiration. Regular rate and rhythm without murmur gallop or rub or increase P2 or edema.  Abd: no hsm, nl excursion. Ext warm without cyanosis  or clubbing.     CXR  09/02/2013 :  Abnormal new density at the inferior aspect of the right hilum extending into the right lower lobe. The possibility of a mass in the right hilum should be considered.      Assessment & Plan:

## 2013-09-03 ENCOUNTER — Telehealth: Payer: Self-pay | Admitting: *Deleted

## 2013-09-03 MED ORDER — AMOXICILLIN-POT CLAVULANATE 875-125 MG PO TABS: 1.0000 | ORAL_TABLET | Freq: Two times a day (BID) | ORAL | Status: DC

## 2013-09-03 MED ORDER — LOSARTAN POTASSIUM 50 MG PO TABS: 50.0000 mg | ORAL_TABLET | Freq: Every day | ORAL | Status: DC

## 2013-09-03 NOTE — Addendum Note (Signed)
Addended by: Christen Butter on: 09/03/2013 03:14 PM   Modules accepted: Orders

## 2013-09-03 NOTE — Assessment & Plan Note (Signed)

## 2013-09-03 NOTE — Telephone Encounter (Signed)
stop lisinorpil  rx cozaar 50/12.5 daily

## 2013-09-03 NOTE — Telephone Encounter (Signed)
No, let's leave her the same until regroup

## 2013-09-03 NOTE — Telephone Encounter (Signed)
augmentin 875 bid x 10 days  

## 2013-09-03 NOTE — Telephone Encounter (Signed)
Pt is aware of recs.

## 2013-09-03 NOTE — Telephone Encounter (Signed)
Spoke with the pt and she states that we did not change her BP meds She is coming in for ov 09/15/13- do you want to change then, or now?  Please advise, thanks

## 2013-09-03 NOTE — Telephone Encounter (Signed)
Spoke with the pt and notified of recs per MW She verbalized understanding  She states as of late last night started having more prod cough with moderate yellow sputum.  She had a few tablets left of amoxicillin from her dentist and has started taking med- but only has 3 tablets  Would like to know if needs something called in before the wkend Please advise, thanks! Allergies  Allergen Reactions  . Nsaids     GI Upset  . Aspirin Other (See Comments)    Gi symptoms. Does NOT take ibuprofen or other NSAIDS

## 2013-09-03 NOTE — Telephone Encounter (Signed)
Message copied by Christen Butter on Fri Sep 03, 2013 12:14 PM ------      Message from: Sandrea Hughs B      Created: Fri Sep 03, 2013  7:58 AM       Did we stop her acei and give her a substitute (can't find it on instructions so call and ask her what she did ------

## 2013-09-03 NOTE — Assessment & Plan Note (Addendum)
-   HFA 75% p coaching 02/17/2013  - Spirometry   Feb 17 2013 FEV1  1.23 (49%)  Ratio 52  Unfortunately still smoking and now concern re ? Lung ca though since improving with abx this may just be resolving pna  DDX of  difficult airways managment all start with A and  include Adherence, Ace Inhibitors, Acid Reflux, Active Sinus Disease, Alpha 1 Antitripsin deficiency, Anxiety masquerading as Airways dz,  ABPA,  allergy(esp in young), Aspiration (esp in elderly), Adverse effects of DPI,  Active smokers, plus two Bs  = Bronchiectasis and Beta blocker use..and one C= CHF  Adherence is always the initial "prime suspect" and is a multilayered concern that requires a "trust but verify" approach in every patient - starting with knowing how to use medications, especially inhalers, correctly, keeping up with refills and understanding the fundamental difference between maintenance and prns vs those medications only taken for a very short course and then stopped and not refilled.   ? acei contributing to symptoms > try off as of 09/03/2013   ? Acid reflux > short term rx/ diet reviewed  ? Active sinus dz suggested by persistent upper airway symptoms which are indistiguishable from acei and acid reflux effects so address these first > rx augmentin 875 bid x 10 daysthen sinus ct next   Active smoking > discussed separately

## 2013-09-03 NOTE — Telephone Encounter (Addendum)
Spoke with pt She is aware rx for abx was sent She states that she is already off of ACE and is taking atenolol in it's place Will forward to Dr Sherene Sires to he is aware

## 2013-09-04 ENCOUNTER — Emergency Department (HOSPITAL_COMMUNITY): Payer: Medicare Other

## 2013-09-04 ENCOUNTER — Inpatient Hospital Stay (HOSPITAL_COMMUNITY): Payer: Medicare Other

## 2013-09-04 ENCOUNTER — Inpatient Hospital Stay (HOSPITAL_COMMUNITY)
Admission: EM | Admit: 2013-09-04 | Discharge: 2013-09-06 | DRG: 180 | Disposition: A | Discharge status: Home / Self Care | Payer: Medicare Other | Attending: Family Medicine | Admitting: Family Medicine

## 2013-09-04 ENCOUNTER — Encounter (HOSPITAL_COMMUNITY): Payer: Self-pay

## 2013-09-04 DIAGNOSIS — F4310 Post-traumatic stress disorder, unspecified: Secondary | ICD-10-CM | POA: Diagnosis present

## 2013-09-04 DIAGNOSIS — C3430 Malignant neoplasm of lower lobe, unspecified bronchus or lung: Principal | ICD-10-CM | POA: Diagnosis present

## 2013-09-04 DIAGNOSIS — M19049 Primary osteoarthritis, unspecified hand: Secondary | ICD-10-CM | POA: Diagnosis present

## 2013-09-04 DIAGNOSIS — M1610 Unilateral primary osteoarthritis, unspecified hip: Secondary | ICD-10-CM | POA: Diagnosis present

## 2013-09-04 DIAGNOSIS — J189 Pneumonia, unspecified organism: Secondary | ICD-10-CM | POA: Diagnosis present

## 2013-09-04 DIAGNOSIS — R042 Hemoptysis: Secondary | ICD-10-CM

## 2013-09-04 DIAGNOSIS — I10 Essential (primary) hypertension: Secondary | ICD-10-CM | POA: Diagnosis present

## 2013-09-04 DIAGNOSIS — R918 Other nonspecific abnormal finding of lung field: Secondary | ICD-10-CM

## 2013-09-04 DIAGNOSIS — Z833 Family history of diabetes mellitus: Secondary | ICD-10-CM

## 2013-09-04 DIAGNOSIS — Z8673 Personal history of transient ischemic attack (TIA), and cerebral infarction without residual deficits: Secondary | ICD-10-CM

## 2013-09-04 DIAGNOSIS — J449 Chronic obstructive pulmonary disease, unspecified: Secondary | ICD-10-CM

## 2013-09-04 DIAGNOSIS — F3289 Other specified depressive episodes: Secondary | ICD-10-CM | POA: Diagnosis present

## 2013-09-04 DIAGNOSIS — Z981 Arthrodesis status: Secondary | ICD-10-CM

## 2013-09-04 DIAGNOSIS — F329 Major depressive disorder, single episode, unspecified: Secondary | ICD-10-CM

## 2013-09-04 DIAGNOSIS — N950 Postmenopausal bleeding: Secondary | ICD-10-CM

## 2013-09-04 DIAGNOSIS — M1710 Unilateral primary osteoarthritis, unspecified knee: Secondary | ICD-10-CM | POA: Diagnosis present

## 2013-09-04 DIAGNOSIS — D071 Carcinoma in situ of vulva: Secondary | ICD-10-CM

## 2013-09-04 DIAGNOSIS — Z8249 Family history of ischemic heart disease and other diseases of the circulatory system: Secondary | ICD-10-CM

## 2013-09-04 DIAGNOSIS — F419 Anxiety disorder, unspecified: Secondary | ICD-10-CM

## 2013-09-04 DIAGNOSIS — S92209A Fracture of unspecified tarsal bone(s) of unspecified foot, initial encounter for closed fracture: Secondary | ICD-10-CM

## 2013-09-04 DIAGNOSIS — J4489 Other specified chronic obstructive pulmonary disease: Secondary | ICD-10-CM | POA: Diagnosis present

## 2013-09-04 DIAGNOSIS — G8929 Other chronic pain: Secondary | ICD-10-CM

## 2013-09-04 DIAGNOSIS — M169 Osteoarthritis of hip, unspecified: Secondary | ICD-10-CM | POA: Diagnosis present

## 2013-09-04 DIAGNOSIS — F411 Generalized anxiety disorder: Secondary | ICD-10-CM | POA: Diagnosis present

## 2013-09-04 DIAGNOSIS — F17200 Nicotine dependence, unspecified, uncomplicated: Secondary | ICD-10-CM | POA: Diagnosis present

## 2013-09-04 LAB — BASIC METABOLIC PANEL
BUN: 5 mg/dL — ABNORMAL LOW (ref 6–23)
Creatinine, Ser: 0.7 mg/dL (ref 0.50–1.10)
GFR calc Af Amer: >90 mL/min (ref >90)
GFR calc non Af Amer: 89 mL/min — ABNORMAL LOW (ref >90)

## 2013-09-04 LAB — CBC WITH DIFFERENTIAL/PLATELET
Basophils Relative: 1 % (ref 0–1)
Eosinophils Absolute: 0.1 10*3/uL (ref 0.0–0.7)
Eosinophils Relative: 2 % (ref 0–5)
HCT: 35.9 % — ABNORMAL LOW (ref 36.0–46.0)
Hemoglobin: 11.7 g/dL — ABNORMAL LOW (ref 12.0–15.0)
MCH: 30.6 pg (ref 26.0–34.0)
MCHC: 32.6 g/dL (ref 30.0–36.0)
Monocytes Absolute: 0.5 10*3/uL (ref 0.1–1.0)
Monocytes Relative: 8 % (ref 3–12)

## 2013-09-04 MED ORDER — ACETAMINOPHEN 650 MG RE SUPP: 650.0000 mg | Freq: Four times a day (QID) | RECTAL | Status: DC | PRN

## 2013-09-04 MED ORDER — DEXTROSE 5 % IV SOLN
1.0000 g | Freq: Once | INTRAVENOUS | Status: AC
  Administered 2013-09-04: 1 g via INTRAVENOUS
  Filled 2013-09-04: qty 10

## 2013-09-04 MED ORDER — ATENOLOL 25 MG PO TABS
25.0000 mg | ORAL_TABLET | Freq: Every day | ORAL | Status: DC
  Administered 2013-09-05 – 2013-09-06 (×2): 25 mg via ORAL
  Filled 2013-09-04 (×2): qty 1

## 2013-09-04 MED ORDER — DEXTROSE 5 % IV SOLN
500.0000 mg | Freq: Once | INTRAVENOUS | Status: DC
  Filled 2013-09-04: qty 500

## 2013-09-04 MED ORDER — ACLIDINIUM BROMIDE 400 MCG/ACT IN AEPB
1.0000 | INHALATION_SPRAY | Freq: Two times a day (BID) | RESPIRATORY_TRACT | Status: DC
  Filled 2013-09-04 (×4): qty 1

## 2013-09-04 MED ORDER — SODIUM CHLORIDE 0.9 % IJ SOLN: 3.0000 mL | INTRAMUSCULAR | Status: DC | PRN

## 2013-09-04 MED ORDER — SODIUM CHLORIDE 0.9 % IV BOLUS (SEPSIS): 500.0000 mL | Freq: Once | INTRAVENOUS | Status: DC

## 2013-09-04 MED ORDER — IPRATROPIUM BROMIDE 0.02 % IN SOLN
0.5000 mg | Freq: Once | RESPIRATORY_TRACT | Status: AC
  Administered 2013-09-04: 0.5 mg via RESPIRATORY_TRACT
  Filled 2013-09-04: qty 2.5

## 2013-09-04 MED ORDER — CHLORZOXAZONE 500 MG PO TABS
ORAL_TABLET | ORAL | Status: AC
  Filled 2013-09-04: qty 2

## 2013-09-04 MED ORDER — SODIUM CHLORIDE 0.9 % IJ SOLN
3.0000 mL | Freq: Two times a day (BID) | INTRAMUSCULAR | Status: DC
  Administered 2013-09-04 – 2013-09-06 (×5): 3 mL via INTRAVENOUS

## 2013-09-04 MED ORDER — NICOTINE 14 MG/24HR TD PT24
14.0000 mg | MEDICATED_PATCH | Freq: Every day | TRANSDERMAL | Status: DC
  Administered 2013-09-04 – 2013-09-06 (×3): 14 mg via TRANSDERMAL
  Filled 2013-09-04 (×3): qty 1

## 2013-09-04 MED ORDER — DEXTROSE 5 % IV SOLN
INTRAVENOUS | Status: AC
  Filled 2013-09-04 (×2): qty 1

## 2013-09-04 MED ORDER — VANCOMYCIN HCL IN DEXTROSE 1-5 GM/200ML-% IV SOLN
1000.0000 mg | Freq: Two times a day (BID) | INTRAVENOUS | Status: DC
  Administered 2013-09-04 – 2013-09-05 (×2): 1000 mg via INTRAVENOUS
  Filled 2013-09-04 (×4): qty 200

## 2013-09-04 MED ORDER — DM-GUAIFENESIN ER 30-600 MG PO TB12
1.0000 | ORAL_TABLET | Freq: Two times a day (BID) | ORAL | Status: DC
  Administered 2013-09-04 – 2013-09-06 (×4): 1 via ORAL
  Filled 2013-09-04 (×4): qty 1

## 2013-09-04 MED ORDER — DEXTROSE 5 % IV SOLN
1.0000 g | Freq: Three times a day (TID) | INTRAVENOUS | Status: DC
  Administered 2013-09-04 – 2013-09-06 (×6): 1 g via INTRAVENOUS
  Filled 2013-09-04 (×9): qty 1

## 2013-09-04 MED ORDER — TRAZODONE HCL 50 MG PO TABS
300.0000 mg | ORAL_TABLET | Freq: Every day | ORAL | Status: DC
  Administered 2013-09-04 – 2013-09-05 (×2): 300 mg via ORAL
  Filled 2013-09-04 (×2): qty 6

## 2013-09-04 MED ORDER — ALPRAZOLAM 1 MG PO TABS
1.0000 mg | ORAL_TABLET | Freq: Four times a day (QID) | ORAL | Status: DC
  Administered 2013-09-04 – 2013-09-06 (×8): 1 mg via ORAL
  Filled 2013-09-04 (×9): qty 1

## 2013-09-04 MED ORDER — SERTRALINE HCL 50 MG PO TABS
200.0000 mg | ORAL_TABLET | Freq: Every day | ORAL | Status: DC
  Administered 2013-09-05 – 2013-09-06 (×2): 200 mg via ORAL
  Filled 2013-09-04 (×2): qty 4

## 2013-09-04 MED ORDER — SODIUM CHLORIDE 0.9 % IV SOLN: 250.0000 mL | INTRAVENOUS | Status: DC | PRN

## 2013-09-04 MED ORDER — ALBUTEROL SULFATE (5 MG/ML) 0.5% IN NEBU
2.5000 mg | INHALATION_SOLUTION | Freq: Four times a day (QID) | RESPIRATORY_TRACT | Status: DC | PRN
  Administered 2013-09-04: 2.5 mg via RESPIRATORY_TRACT
  Filled 2013-09-04: qty 0.5

## 2013-09-04 MED ORDER — HYDROCODONE-ACETAMINOPHEN 5-325 MG PO TABS
1.0000 | ORAL_TABLET | ORAL | Status: DC | PRN
  Administered 2013-09-04 – 2013-09-05 (×4): 1 via ORAL
  Filled 2013-09-04 (×5): qty 1

## 2013-09-04 MED ORDER — ACETAMINOPHEN 325 MG PO TABS: 650.0000 mg | ORAL_TABLET | Freq: Four times a day (QID) | ORAL | Status: DC | PRN

## 2013-09-04 MED ORDER — VANCOMYCIN HCL IN DEXTROSE 1-5 GM/200ML-% IV SOLN
INTRAVENOUS | Status: AC
  Filled 2013-09-04: qty 200

## 2013-09-04 MED ORDER — ALBUTEROL SULFATE (5 MG/ML) 0.5% IN NEBU
5.0000 mg | INHALATION_SOLUTION | Freq: Once | RESPIRATORY_TRACT | Status: AC
  Administered 2013-09-04: 5 mg via RESPIRATORY_TRACT
  Filled 2013-09-04: qty 1

## 2013-09-04 MED ORDER — IPRATROPIUM BROMIDE 0.02 % IN SOLN
RESPIRATORY_TRACT | Status: AC
  Administered 2013-09-04: 0.5 mg
  Filled 2013-09-04: qty 2.5

## 2013-09-04 MED ORDER — IOHEXOL 300 MG/ML  SOLN
80.0000 mL | Freq: Once | INTRAMUSCULAR | Status: AC | PRN
  Administered 2013-09-04: 80 mL via INTRAVENOUS

## 2013-09-04 MED ORDER — ONDANSETRON HCL 4 MG/2ML IJ SOLN: 4.0000 mg | Freq: Four times a day (QID) | INTRAMUSCULAR | Status: DC | PRN

## 2013-09-04 MED ORDER — CHLORZOXAZONE 500 MG PO TABS
500.0000 mg | ORAL_TABLET | Freq: Four times a day (QID) | ORAL | Status: DC | PRN
  Administered 2013-09-04 – 2013-09-06 (×6): 500 mg via ORAL
  Filled 2013-09-04: qty 1

## 2013-09-04 MED ORDER — ALBUTEROL SULFATE (5 MG/ML) 0.5% IN NEBU
2.5000 mg | INHALATION_SOLUTION | RESPIRATORY_TRACT | Status: DC
  Administered 2013-09-04 – 2013-09-06 (×10): 2.5 mg via RESPIRATORY_TRACT
  Filled 2013-09-04 (×10): qty 0.5

## 2013-09-04 MED ORDER — ONDANSETRON HCL 4 MG PO TABS: 4.0000 mg | ORAL_TABLET | Freq: Four times a day (QID) | ORAL | Status: DC | PRN

## 2013-09-04 NOTE — Progress Notes (Signed)
ANTIBIOTIC CONSULT NOTE - INITIAL   Pharmacy Consult for Vancomycin & Renal adjustment antibiotics Indication: pneumonia  Allergies  Allergen Reactions  . Nsaids     GI Upset  . Aspirin Other (See Comments)    Gi symptoms. Does NOT take ibuprofen or other NSAIDS    Patient Measurements:  Vital Signs: Temp: 98.3 F (36.8 C) (09/06 1215) Temp src: Oral (09/06 1215) BP: 149/63 mmHg (09/06 1514) Pulse Rate: 56 (09/06 1514) Intake/Output from previous day:   Intake/Output from this shift:    Labs:  Recent Labs  09/04/13 1235  WBC 5.9  HGB 11.7*  PLT 221  CREATININE 0.70   The CrCl is unknown because both a height and weight (above a minimum accepted value) are required for this calculation. No results found for this basename: VANCOTROUGH, VANCOPEAK, VANCORANDOM, GENTTROUGH, GENTPEAK, GENTRANDOM, TOBRATROUGH, TOBRAPEAK, TOBRARND, AMIKACINPEAK, AMIKACINTROU, AMIKACIN,  in the last 72 hours   Microbiology: No results found for this or any previous visit (from the past 720 hour(s)).  Medical History: Past Medical History  Diagnosis Date  . H/O: stroke 2012, 2000,     X3  . Hypertension   . Anxiety   . PTSD (post-traumatic stress disorder)   . Depression   . SVD (spontaneous vaginal delivery)     x 1  . Smoker   . Osteoarthritis     knees, hips hands,   . Shortness of breath   . Pain     SEVERE PAIN BACK, RIGHT HIP AND KNEES--HARRINGTON RODS AND CERVICAL PLATES--USES WALKER OR CANE WHEN AMBULATING - GOES TO A PAIN CLINIC-STATES SHE NEEDS RT HIP AND BOTH KNEES REPLACED. PT STATES SHE WAS TOLD THE CEMENT AROUND THE HARRINGTON RODS IS CRACKED.  [name] Hot flashes, menopausal     SEVERE  . Family history of anesthesia complication     equipment failure caused problem  . Complication of anesthesia 03/02/13    severe headache,vertigo postop  . COPD (chronic obstructive pulmonary disease)     stable  . Stroke 2012    residual tremors    Medications:  Scheduled:  [name]  Aclidinium Bromide  1 puff Inhalation BID  . ALPRAZolam  1 mg Oral QID  . [START ON 09/05/2013] atenolol  25 mg Oral Daily  . ceFEPime (MAXIPIME) IV  1 g Intravenous Q8H  . dextromethorphan-guaiFENesin  1 tablet Oral Q12H  . nicotine  14 mg Transdermal Daily  . [START ON 09/05/2013] sertraline  200 mg Oral Daily  . sodium chloride  3 mL Intravenous Q12H  . trazodone  300 mg Oral QHS  . vancomycin  1,000 mg Intravenous Q12H   Assessment: Patient weight approximately 76 kg Calculated CrCl approximately 60 ml/min  Goal of Therapy:  Vancomycin trough level 15-20 mcg/ml  Plan:  Vancomycin 1 GM IV every 12 hours Continue Cefepime 1 GM IV every 8 hours Vancomycin trough at steady state Labs per protocol Josephine Igo 09/04/2013,4:03 PM

## 2013-09-04 NOTE — ED Notes (Signed)
Patient transferred to the floor in no apparent distress via stretcher.

## 2013-09-04 NOTE — ED Notes (Signed)
MD at bedside. 

## 2013-09-04 NOTE — ED Provider Notes (Signed)
CSN: [id number]     Arrival date & time 09/04/13  1209 History   First MD Initiated Contact with Patient 09/04/13 1247     Chief Complaint  Patient presents with  . Shortness of Breath   (Consider location/radiation/quality/duration/timing/severity/associated sxs/prior Treatment) HPI..... upper respiratory infection, shortness of breath for 2-3 weeks. Patient's been seen by her primary care Dr who has prescribed both Levaquin and Augmentin. For the past couple of days she has been coughing up blood described as a relatively small amount and in clots. No fever, chills, rusty sputum. Patient is long-term smoker. Severity is moderate. Patient lives at home. Nothing makes symptoms better or worse.  Past Medical History  Diagnosis Date  . H/O: stroke 2012, 2000,     X3  . Hypertension   . Anxiety   . PTSD (post-traumatic stress disorder)   . Depression   . SVD (spontaneous vaginal delivery)     x 1  . Smoker   . Osteoarthritis     knees, hips hands,   . Shortness of breath   . Pain     SEVERE PAIN BACK, RIGHT HIP AND KNEES--HARRINGTON RODS AND CERVICAL PLATES--USES WALKER OR CANE WHEN AMBULATING - GOES TO A PAIN CLINIC-STATES SHE NEEDS RT HIP AND BOTH KNEES REPLACED. PT STATES SHE WAS TOLD THE CEMENT AROUND THE HARRINGTON RODS IS CRACKED.  [name] Hot flashes, menopausal     SEVERE  . Family history of anesthesia complication     equipment failure caused problem  . Complication of anesthesia 03/02/13    severe headache,vertigo postop  . COPD (chronic obstructive pulmonary disease)     stable  . Stroke 2012    residual tremors   Past Surgical History  Procedure Laterality Date  . Carpal tunnel release      BILATERAL  . Nasal sinus surgery    . Lumbar fusion      L4-S1  . Cervical fusion    . Hernia repair    . Salpingoophorectomy Right   . Vaginal hysterectomy      TAH w/ ovary removal  . Transthoracic echocardiogram  12-03-2011    LVSF NORMAL/ EF 60-65%  . Vulvectomy N/A 03/02/2013     Procedure: WIDE LOCAL EXCISION VULVAR;  Surgeon: Rejeana Brock A. Duard Brady, MD;  Location: [facility];  Service: Gynecology;  Laterality: N/A;  . Co2 laser application N/A 05/13/2013    Procedure: LASER APPLICATION OF THE VULVA;  Surgeon: Laurette Schimke, MD;  Location: [facility];  Service: Gynecology;  Laterality: N/A;   Family History  Problem Relation Age of Onset  . Cancer Mother     COLON  . Heart disease Father   . Diabetes Maternal Aunt   . Cancer Maternal Grandfather     KIDNEY   . Diabetes Maternal Grandfather   . Hypertension Maternal Grandfather   . Heart disease Maternal Grandfather    History  Substance Use Topics  . Smoking status: Current Every Day Smoker -- 0.50 packs/day for 35 years    Types: Cigarettes  . Smokeless tobacco: Never Used     Comment: 4cigs per day 02/17/13  . Alcohol Use: No   OB History   Grav Para Term Preterm Abortions TAB SAB Ect Mult Living   2 1   1     1      Review of Systems  All other systems reviewed and are negative.    Allergies  Nsaids and Aspirin  Home Medications   Current Outpatient Rx  Name  Route  Sig  Dispense  Refill  . Aclidinium Bromide (TUDORZA PRESSAIR) 400 MCG/ACT AEPB   Inhalation   Inhale 1 puff into the lungs 2 (two) times daily.         [name] albuterol (PROVENTIL) (2.5 MG/3ML) 0.083% nebulizer solution   Nebulization   Take 2.5 mg by nebulization as needed for wheezing.         [name] albuterol (PROVENTIL) (5 MG/ML) 0.5% nebulizer solution   Nebulization   Take 2.5 mg by nebulization every 6 (six) hours as needed for wheezing.         [name] ALPRAZolam (XANAX) 1 MG tablet   Oral   Take 1 mg by mouth 4 (four) times daily.          [name] amoxicillin-clavulanate (AUGMENTIN) 875-125 MG per tablet   Oral   Take 1 tablet by mouth 2 (two) times daily.   20 tablet   0     Pt would like RX delivered   . atenolol (TENORMIN) 25 MG tablet   Oral   Take 25 mg by mouth daily.         . chlorzoxazone  (PARAFON) 500 MG tablet   Oral   Take 500 mg by mouth 4 (four) times daily as needed for muscle spasms.         [name] dextromethorphan-guaiFENesin (MUCINEX DM) 30-600 MG per 12 hr tablet   Oral   Take 1 tablet by mouth every 12 (twelve) hours.         . diphenhydrAMINE (BENADRYL) 25 mg capsule   Oral   Take 25 mg by mouth 2 (two) times daily.          [name] HYDROcodone-acetaminophen (LORTAB) 7.5-500 MG per tablet   Oral   Take 1 tablet by mouth 4 (four) times daily.          [name] lidocaine (XYLOCAINE) 2 % jelly   Topical   Apply topically as needed. To the vulva   30 mL   2   . mometasone-formoterol (DULERA) 200-5 MCG/ACT AERO      Take 2 puffs first thing in am and then another 2 puffs about 12 hours later.         . sertraline (ZOLOFT) 100 MG tablet   Oral   Take 200 mg by mouth daily.          . trazodone (DESYREL) 300 MG tablet   Oral   Take 300 mg by mouth at bedtime.          BP 143/78  Temp(Src) 98.3 F (36.8 C) (Oral)  Resp 21  SpO2 98% Physical Exam  Nursing note and vitals reviewed. Constitutional: She is oriented to person, place, and time.  Pale, no frank dyspnea  HENT:  Head: Normocephalic and atraumatic.  Eyes: Conjunctivae and EOM are normal. Pupils are equal, round, and reactive to light.  Neck: Normal range of motion. Neck supple.  Cardiovascular: Normal rate, regular rhythm and normal heart sounds.   Pulmonary/Chest: Effort normal and breath sounds normal.  Abdominal: Soft. Bowel sounds are normal.  Musculoskeletal: Normal range of motion.  Neurological: She is alert and oriented to person, place, and time.  Skin: Skin is warm and dry.  Psychiatric: She has a normal mood and affect.    ED Course  Procedures (including critical care time) Labs Review Labs Reviewed  CBC WITH DIFFERENTIAL - Abnormal; Notable for the following:    RBC 3.82 (*)    Hemoglobin  11.7 (*)    HCT 35.9 (*)    All other components within normal limits  BASIC  METABOLIC PANEL - Abnormal; Notable for the following:    BUN 5 (*)    GFR calc non Af Amer 89 (*)    All other components within normal limits   Imaging Review Dg Chest 2 View  09/04/2013   *RADIOLOGY REPORT*  Clinical Data: Cough, congestion and shortness of breath for 3 weeks.  CHEST - 2 VIEW  Comparison: 09/01/2013 and 02/18/2013  Findings: Lungs are adequately inflated and demonstrate opacification of the medial right lower lobe suggesting infection as this appears slightly worse on the lateral film compared to the prior exam.  No evidence of effusion.  Mild cardiomegaly. Remainder of the exam is unchanged.  IMPRESSION: Airspace density of the medial right lower lobe which appears slightly worse on the lateral film compared to the prior exam. This may represent infection.   Original Report Authenticated By: Elberta Fortis, M.D.    MDM   1. Community acquired pneumonia    Chest x-ray shows right lower lobe pneumonia.  She has failed both Levaquin and Augmentin as an outpatient.    IV Rocephin, IV Zithromax.    No frank respiratory distress.    Admit to general medicine    Donnetta Hutching, MD 09/04/13 1416

## 2013-09-04 NOTE — ED Notes (Signed)
Report called and given to [facility] the receiving nurse.

## 2013-09-04 NOTE — Progress Notes (Signed)
Results of Chest CT taken earlier today were called to this nurse. Paged hospitalist on call with the results.

## 2013-09-04 NOTE — H&P (Signed)
History and Physical  [name] ZOX:[id number] DOB: 1946/11/07 DOA: 09/04/2013  Referring physician: Donnetta Hutching, MD PCP: [name], MD   Chief Complaint: Coughing up blood  HPI:  67 year old woman with history of COPD GOLD III  Presented to the emergency department with 3 week history of hemoptysis. Initial evaluation in the emergency department was notable for stable vital signs, no hypoxia or tachypnea, no frank hemoptysis. Chest x-ray suggested worsening of right lower lobe airspace disease possibly representing infection and patient was referred for admission.  History obtained from chart and patient. She has a history of COPD but is not oxygen dependent. She does continue to smoke. She reports that she has been sick for the last 3 weeks with some low-volume hemoptysis which appears to be blood tinged sputum, some cough and some shortness of breath. She has had a round of Levaquin and Augmentin in the last 3 weeks. She saw her pulmonologist 9/3, chest x-ray obtained that day suggested the possibility of pneumonia or mass and she was started on Augmentin. She has continued to have blood-tinged sputum, cough and mild shortness of breath. No frank bleeding or large volume hemoptysis.  Her main complaint is hunger at this point. She also requested her chronic pain medication several times during the period  In the emergency department she was noted to be afebrile with stable vital signs and no hypoxia.screening laboratory studies were unremarkable, hemoglobin appears to be near baseline. She was treated with nebulizer therapy and antibiotics.  Review of Systems:  Positive for fever up to 101 at home over the last 3 weeks, sore throat, chronic bilateral knee pain, back pain, mild shortness of breath  Negative for visual changes, rash, new muscle aches, chest pain, dysuria, n/v/abdominal pain.  Past Medical History  Diagnosis Date  . H/O: stroke 2012, 2000,     X3  . Hypertension   .  Anxiety   . PTSD (post-traumatic stress disorder)   . Depression   . SVD (spontaneous vaginal delivery)     x 1  . Smoker   . Osteoarthritis     knees, hips hands,   . Shortness of breath   . Pain     SEVERE PAIN BACK, RIGHT HIP AND KNEES--HARRINGTON RODS AND CERVICAL PLATES--USES WALKER OR CANE WHEN AMBULATING - GOES TO A PAIN CLINIC-STATES SHE NEEDS RT HIP AND BOTH KNEES REPLACED. PT STATES SHE WAS TOLD THE CEMENT AROUND THE H[place] RODS IS CRACKED.  [name] Hot flashes, menopausal     SEVERE  . Family history of anesthesia complication     equipment failure caused problem  . Complication of anesthesia 03/02/13    severe headache,vertigo postop  . COPD (chronic obstructive pulmonary disease)     stable  . Stroke 2012    residual tremors    Past Surgical History  Procedure Laterality Date  . Carpal tunnel release      BILATERAL  . Nasal sinus surgery    . Lumbar fusion      L4-S1  . Cervical fusion    . Hernia repair    . Salpingoophorectomy Right   . Vaginal hysterectomy      TAH w/ ovary removal  . Transthoracic echocardiogram  12-03-2011    LVSF NORMAL/ EF 60-65%  . Vulvectomy N/A 03/02/2013    Procedure: WIDE LOCAL EXCISION VULVAR;  Surgeon: Rejeana Brock A. Duard Brady, MD;  Location: [facility];  Service: Gynecology;  Laterality: N/A;  . Co2 laser application N/A 05/13/2013  Procedure: LASER APPLICATION OF THE VULVA;  Surgeon: Laurette Schimke, MD;  Location: [facility];  Service: Gynecology;  Laterality: N/A;    Social History:  reports that she has been smoking Cigarettes.  She has a 17.5 pack-year smoking history. She has never used smokeless tobacco. She reports that she does not drink alcohol or use illicit drugs.  Allergies  Allergen Reactions  . Nsaids     GI Upset  . Aspirin Other (See Comments)    Gi symptoms. Does NOT take ibuprofen or other NSAIDS    Family History  Problem Relation Age of Onset  . Cancer Mother     COLON  . Heart disease Father   .  Diabetes Maternal Aunt   . Cancer Maternal Grandfather     KIDNEY   . Diabetes Maternal Grandfather   . Hypertension Maternal Grandfather   . Heart disease Maternal Grandfather      Prior to Admission medications   Medication Sig Start Date End Date Taking? Authorizing Provider  Aclidinium Bromide (TUDORZA PRESSAIR) 400 MCG/ACT AEPB Inhale 1 puff into the lungs 2 (two) times daily.   Yes Historical Provider, MD  albuterol (PROVENTIL) (2.5 MG/3ML) 0.083% nebulizer solution Take 2.5 mg by nebulization as needed for wheezing.   Yes Historical Provider, MD  albuterol (PROVENTIL) (5 MG/ML) 0.5% nebulizer solution Take 2.5 mg by nebulization every 6 (six) hours as needed for wheezing.   Yes Historical Provider, MD  ALPRAZolam Prudy Feeler) 1 MG tablet Take 1 mg by mouth 4 (four) times daily.    Yes Historical Provider, MD  amoxicillin-clavulanate (AUGMENTIN) 875-125 MG per tablet Take 1 tablet by mouth 2 (two) times daily. 09/03/13  Yes Nyoka Cowden, MD  atenolol (TENORMIN) 25 MG tablet Take 25 mg by mouth daily.   Yes Historical Provider, MD  chlorzoxazone (PARAFON) 500 MG tablet Take 500 mg by mouth 4 (four) times daily as needed for muscle spasms.   Yes Historical Provider, MD  dextromethorphan-guaiFENesin (MUCINEX DM) 30-600 MG per 12 hr tablet Take 1 tablet by mouth every 12 (twelve) hours.   Yes Historical Provider, MD  diphenhydrAMINE (BENADRYL) 25 mg capsule Take 25 mg by mouth 2 (two) times daily.    Yes Historical Provider, MD  HYDROcodone-acetaminophen (LORTAB) 7.5-500 MG per tablet Take 1 tablet by mouth 4 (four) times daily.    Yes Historical Provider, MD  lidocaine (XYLOCAINE) 2 % jelly Apply topically as needed. To the vulva 05/14/13  Yes Melissa D Cross, NP  mometasone-formoterol (DULERA) 200-5 MCG/ACT AERO Take 2 puffs first thing in am and then another 2 puffs about 12 hours later. 09/01/13  Yes Nyoka Cowden, MD  sertraline (ZOLOFT) 100 MG tablet Take 200 mg by mouth daily.    Yes  Historical Provider, MD  trazodone (DESYREL) 300 MG tablet Take 300 mg by mouth at bedtime.   Yes Historical Provider, MD   Physical Exam: Filed Vitals:   09/04/13 1215 09/04/13 1332  BP: 143/78   Temp: 98.3 F (36.8 C)   TempSrc: Oral   Resp: 21   SpO2: 96% 98%   General: Examined in emergency department. Appears calm and comfortable Eyes: PERRL, normal lids, irises & conjunctiva ENT: grossly normal hearing, lips & tongue Neck: no LAD, masses or thyromegaly Cardiovascular: RRR, no m/r/g. No LE edema. Telemetry: SR, no arrhythmias  Respiratory: CTA bilaterally, no w/r/r. Normal respiratory effort. Abdomen: soft, ntnd Skin: no rash or induration seen on limited exam Musculoskeletal: grossly normal tone BUE/BLE  Psychiatric: grossly normal mood and affect, speech fluent and appropriate Neurologic: grossly non-focal.  Wt Readings from Last 3 Encounters:  09/01/13 76.839 kg (169 lb 6.4 oz)  08/05/13 74.435 kg (164 lb 1.6 oz)  06/22/13 73.211 kg (161 lb 6.4 oz)    Labs on Admission:  Basic Metabolic Panel:  Recent Labs Lab 09/04/13 1235  NA 137  K 3.9  CL 101  CO2 31  GLUCOSE 75  BUN 5*  CREATININE 0.70  CALCIUM 8.7   CBC:  Recent Labs Lab 09/04/13 1235  WBC 5.9  NEUTROABS 3.3  HGB 11.7*  HCT 35.9*  MCV 94.0  PLT 221   Radiological Exams on Admission: Dg Chest 2 View  09/04/2013   *RADIOLOGY REPORT*  Clinical Data: Cough, congestion and shortness of breath for 3 weeks.  CHEST - 2 VIEW  Comparison: 09/01/2013 and 02/18/2013  Findings: Lungs are adequately inflated and demonstrate opacification of the medial right lower lobe suggesting infection as this appears slightly worse on the lateral film compared to the prior exam.  No evidence of effusion.  Mild cardiomegaly. Remainder of the exam is unchanged.  IMPRESSION: Airspace density of the medial right lower lobe which appears slightly worse on the lateral film compared to the prior exam. This may represent  infection.   Original Report Authenticated By: Elberta Fortis, M.D.    EKG: Independently reviewed. Sinus bradycardia, no acute changes   Principal Problem:   Hemoptysis Active Problems:   Smoking addiction   Anxiety   COPD GOLD III   Pneumonia   Chronic pain   Assessment/Plan 1. Small volume hemoptysis: Minimal in appearance. Monitor clinically. 2. Right lower lobe airspace density, possible pneumonia: Already has had 2 rounds of outpatient antibiotics. Consider treatment failure. Mass should also be considered. Further evaluation with chest CT. Empiric antibiotics. 3. COPD GOLD III: Appears to be at baseline. Continue chronic therapy. 4. Ongoing cigarette smoking: I recommended smoking cessation. Patient did not appear interested. Offer nicotine patch. 5. Chronic pain: Bilateral knees, back. Continue chronic medications. 6. History of PTSD, anxiety: Appears stable. Continue Xanax, Zoloft.  Code Status: Full code DVT prophylaxis: SCDs Family Communication: None present Disposition Plan/Anticipated LOS: Admission. 2 days.  Time spent: 60 minutes  Brendia Sacks, MD  [facility] Pager [phone] 09/04/2013, 2:52 PM

## 2013-09-04 NOTE — ED Notes (Signed)
Patient transported to X-ray 

## 2013-09-04 NOTE — ED Notes (Signed)
Patient is resting comfortably. 

## 2013-09-04 NOTE — ED Notes (Signed)
Admitting MD at bedside.

## 2013-09-04 NOTE — ED Notes (Signed)
EMs reports pt was called out for SOB x 3 weeks.  Has been to pcp.  Reports coughing up a little bit of blood.  Used albuterol inhaler prior to ems.  EMS reports wheezing in all lung fields.

## 2013-09-05 DIAGNOSIS — J189 Pneumonia, unspecified organism: Secondary | ICD-10-CM

## 2013-09-05 DIAGNOSIS — R918 Other nonspecific abnormal finding of lung field: Secondary | ICD-10-CM

## 2013-09-05 DIAGNOSIS — J449 Chronic obstructive pulmonary disease, unspecified: Secondary | ICD-10-CM

## 2013-09-05 DIAGNOSIS — R222 Localized swelling, mass and lump, trunk: Secondary | ICD-10-CM

## 2013-09-05 LAB — EXPECTORATED SPUTUM ASSESSMENT W GRAM STAIN, RFLX TO RESP C

## 2013-09-05 LAB — CBC
MCHC: 32.5 g/dL (ref 30.0–36.0)
Platelets: 242 10*3/uL (ref 150–400)
RDW: 12.9 % (ref 11.5–15.5)

## 2013-09-05 LAB — BASIC METABOLIC PANEL
GFR calc Af Amer: >90 mL/min (ref >90)
GFR calc non Af Amer: >90 mL/min (ref >90)
Potassium: 4.1 mEq/L (ref 3.5–5.1)
Sodium: 135 mEq/L (ref 135–145)

## 2013-09-05 MED ORDER — VANCOMYCIN HCL IN DEXTROSE 1-5 GM/200ML-% IV SOLN
INTRAVENOUS | Status: AC
  Filled 2013-09-05: qty 200

## 2013-09-05 MED ORDER — IPRATROPIUM BROMIDE 0.02 % IN SOLN: 0.5000 mg | RESPIRATORY_TRACT | Status: DC | PRN

## 2013-09-05 MED ORDER — HYDROCODONE-ACETAMINOPHEN 5-325 MG PO TABS
2.0000 | ORAL_TABLET | ORAL | Status: DC | PRN
  Administered 2013-09-05: 1 via ORAL
  Administered 2013-09-05 – 2013-09-06 (×4): 2 via ORAL
  Filled 2013-09-05 (×2): qty 2
  Filled 2013-09-05: qty 1
  Filled 2013-09-05 (×2): qty 2

## 2013-09-05 MED ORDER — IPRATROPIUM BROMIDE 0.02 % IN SOLN
RESPIRATORY_TRACT | Status: AC
  Administered 2013-09-05: 0.5 mg
  Filled 2013-09-05: qty 2.5

## 2013-09-05 MED ORDER — TIOTROPIUM BROMIDE MONOHYDRATE 18 MCG IN CAPS
18.0000 ug | ORAL_CAPSULE | Freq: Every day | RESPIRATORY_TRACT | Status: DC
  Administered 2013-09-05 – 2013-09-06 (×2): 18 ug via RESPIRATORY_TRACT
  Filled 2013-09-05: qty 5

## 2013-09-05 NOTE — Progress Notes (Signed)
atrovent has been added to neb treatments , until tudorza mdi obtained.

## 2013-09-05 NOTE — Progress Notes (Signed)
[facility] PROGRESS NOTE  JACLIN FINKS ZOX:[id number] DOB: [birth year] DOA: 09/04/2013 PCP: [name], MD Pulmonolgist: Dr. Sherene Sires  Assessment/Plan: 1. 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 Consider outpatient FDG PET CT staging. I discussed the results with the patient and recommended biopsy, she agrees. Discussed with radiologist, this would be best obtained by bronchoscopy. 2. Minimal hemoptysis: Stable. 3. Postobstructive pneumonitis: Afebrile, is first is stable. MRSA PCR negative. Continue antibiotics. 4. COPD GOLD III: Appears stable. Continue current therapy. 5. Ongoing cigarette smoking: Recommend cessation. 6. Chronic pain: Bilateral knees, back. Stable. 7. History of PTSD, anxiety: Appears stable. Continue Xanax, Zoloft.   Discontinue vancomycin. Likely change to oral antibiotics in the morning.  Likely discharge 9/8. Will discuss with Dr. Sherene Sires in the morning, patient will need outpatient bronchoscopy for tissue diagnosis.  Pending studies:   Strep pneumoniae and Legionella urinary antigens  Code Status: Full code DVT prophylaxis: SCDs Family Communication: None present Disposition Plan:   Brendia Sacks, MD  [facility]  Pager [phone] If 7PM-7AM, please contact night-coverage at [email or web address], password [facility] 09/05/2013, 10:49 AM  LOS: 1 day   Summary: 67 year old woman with history of COPD GOLD III Presented to the emergency department with 3 week history of hemoptysis. Initial evaluation in the emergency department was notable for stable vital signs, no hypoxia or tachypnea, no frank hemoptysis. Chest x-ray suggested worsening of right lower lobe airspace disease possibly representing infection and patient was referred for admission.  Consultants:  none  Procedures:  none  Antibiotics:  Cefepime 9/6 >>  Vancomycin 9/6 >> 9/7  HPI/Subjective: Feels better today. Some cough  with minimal blood-tinged sputum. Breathing better. Overall feels better.  Objective: Filed Vitals:   09/04/13 2328 09/05/13 0321 09/05/13 0428 09/05/13 0731  BP:   157/71   Pulse:   74   Temp:   98.3 F (36.8 C)   TempSrc:   Oral   Resp:   21   SpO2: 94% 90% 96% 96%    Intake/Output Summary (Last 24 hours) at 09/05/13 1049 Last data filed at 09/05/13 0612  Gross per 24 hour  Intake    200 ml  Output   1000 ml  Net   -800 ml     There were no vitals filed for this visit.  Exam:   Afebrile, vital signs stable  General: Appears calm and comfortable. Speech fluent and clear.  Cardiovascular: Regular rate and rhythm. No murmur, rub, gallop.  Respiratory: Bilateral wheezes all fields, normal respiratory effort. Speaks in full sentences.  Psychiatric: Grossly normal mood and affect. Speech fluent and appropriate.  Data Reviewed:  CBC, basic metabolic panel unremarkable  MRSA PCR negative  HIV nonreactive   CT of the chest noted  Scheduled Meds: . albuterol  2.5 mg Nebulization Q4H  . ALPRAZolam  1 mg Oral QID  . atenolol  25 mg Oral Daily  . ceFEPime (MAXIPIME) IV  1 g Intravenous Q8H  . dextromethorphan-guaiFENesin  1 tablet Oral Q12H  . nicotine  14 mg Transdermal Daily  . sertraline  200 mg Oral Daily  . sodium chloride  3 mL Intravenous Q12H  . tiotropium  18 mcg Inhalation Daily  . trazodone  300 mg Oral QHS  . vancomycin  1,000 mg Intravenous Q12H   Continuous Infusions:   Principal Problem:   Right lower lobe lung mass Active Problems:   Smoking addiction   Anxiety   COPD GOLD III  Hemoptysis   Pneumonia   Chronic pain   Pneumonitis   Time spent 20  minutes

## 2013-09-06 ENCOUNTER — Encounter: Payer: Self-pay | Admitting: Internal Medicine

## 2013-09-06 LAB — BASIC METABOLIC PANEL
BUN: 6 mg/dL (ref 6–23)
GFR calc Af Amer: >90 mL/min (ref >90)
GFR calc non Af Amer: >90 mL/min (ref >90)
Potassium: 3.9 mEq/L (ref 3.5–5.1)
Sodium: 137 mEq/L (ref 135–145)

## 2013-09-06 LAB — LEGIONELLA ANTIGEN, URINE: Legionella Antigen, Urine: NEGATIVE

## 2013-09-06 MED ORDER — HYDROCODONE-ACETAMINOPHEN 5-325 MG PO TABS: 2.0000 | ORAL_TABLET | Freq: Four times a day (QID) | ORAL | Status: DC | PRN

## 2013-09-06 MED ORDER — NICOTINE 14 MG/24HR TD PT24: 1.0000 | MEDICATED_PATCH | Freq: Every day | TRANSDERMAL | Status: DC

## 2013-09-06 NOTE — Discharge Summary (Addendum)
Patient seen, independently examined and chart reviewed. I agree with exam, assessment and plan discussed with Toya Smothers, NP.  Patient feels much better today. Minimal cough. Minimal blood tinge sputum. Breathing better. Feels ready to go home. She appears better. She is aware of the mass in her right lung and of the plans to followup with Dr. Sherene Sires for further evaluation and diagnosis. I have been in touch with Dr. Sherene Sires and he is aware of findings. She already has followup scheduled with him in the next 10 days.  She can go home today and complete Augmentin previously prescribed by Dr. Sherene Sires. Although CT suggests obstructive pneumonitis clinically I think her symptoms are secondary to mass rather than frank pneumonia.   Brendia Sacks, MD [facility] [phone]

## 2013-09-06 NOTE — Discharge Summary (Signed)
Physician Discharge Summary  [name] ZOX:[id number] DOB: [birth year] DOA: 09/04/2013  PCP: [name], MD  Admit date: 09/04/2013 Discharge date: 09/06/2013  Time spent: 40 minutes  Recommendations for Outpatient Follow-up:  Pt has appointment with Dr. Sherene Sires 09/15/13 at 11:15 am  Discharge Diagnoses:  Principal Problem:   Right lower lobe lung mass Active Problems:   Smoking addiction   Anxiety   COPD GOLD III   Hemoptysis   Pneumonia   Chronic pain   Pneumonitis   Discharge Condition: stable  Diet recommendation: regular  Filed Weights   09/06/13 0501  Weight: 80.015 kg (176 lb 6.4 oz)    History of present illness:  67 year old woman with history of COPD GOLD III Presented to the emergency department on 09/04/13 with 3 week history of hemoptysis. Initial evaluation in the emergency department was notable for stable vital signs, no hypoxia or tachypnea, no frank hemoptysis. Chest x-ray suggested worsening of right lower lobe airspace disease possibly representing infection and patient was referred for admission. She had a history of COPD but not oxygen dependent. She does continue to smoke. She reported that she had been sick for the previous 3 weeks with some low-volume hemoptysis which appeared to be blood tinged sputum, some cough and some shortness of breath. She had a round of Levaquin and Augmentin in the previous 3 weeks. She saw her pulmonologist 9/3, chest x-ray obtained that day suggested the possibility of pneumonia or mass and she was started on Augmentin. She has continued to have blood-tinged sputum, cough and mild shortness of breath. No frank bleeding or large volume hemoptysis. Her main complaint was hunger. She also requested her chronic pain medication several times during the period.  In the emergency department she was noted to be afebrile with stable vital signs and no hypoxia.screening laboratory studies were unremarkable, hemoglobin appeared to be near  baseline. 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 Consider outpatient FDG PET CT staging. Results discussed with the patient and biopsy recommended. She agreed. Radiologist opined this best done per bronch. Pt has appointment with Dr. Sherene Sires on 09/15/13.  Hemoptysis: Stable.   Postobstructive pneumonitis: Pt remained afebrile during hospitalization.MRSA PCR negative. Strep pneumoniae and legionella negative.  Given Cefepime and vanc IV initially. Vanc discontinued 09/05/13. At discharge will resume augmentin prescribed prior to admission and will complete. Follow up with Dr. Sherene Sires 09/15/13.    COPD GOLD III: Remained stable during this hospitalizatoin.    Ongoing cigarette smoking: Recommended cessation.  Chronic pain: Bilateral knees, back. Stable during this hospitalziation.  History of PTSD, anxiety: remained stable during this hospitalizaiton.   Procedures:  none  Consultations:  none  Discharge Exam: Filed Vitals:   09/06/13 0530  BP: 155/76  Pulse:   Temp:   Resp:     General: well nourished NAD Cardiovascular: RRR No MGR No LE edema Respiratory: normal effort  Discharge Instructions  Discharge Orders   Future Appointments Provider Department Dept Phone   09/15/2013 11:15 AM Nyoka Cowden, MD [facility] [phone]   12/16/2013 2:30 PM Laurette Schimke, MD [facility] [phone]   Future Orders Complete By Expires   Diet - low sodium heart healthy  As directed    Discharge instructions  As directed    Comments:     Take augmentin as previously prescribed until completed Follow  up with Dr. Sherene Sires 09/15/13   Increase activity slowly  As directed        Medication List         albuterol (2.5 MG/3ML) 0.083% nebulizer solution  Commonly known as:  PROVENTIL  Take 2.5 mg by  nebulization as needed for wheezing.     albuterol (5 MG/ML) 0.5% nebulizer solution  Commonly known as:  PROVENTIL  Take 2.5 mg by nebulization every 6 (six) hours as needed for wheezing.     ALPRAZolam 1 MG tablet  Commonly known as:  XANAX  Take 1 mg by mouth 4 (four) times daily.     amoxicillin-clavulanate 875-125 MG per tablet  Commonly known as:  AUGMENTIN  Take 1 tablet by mouth 2 (two) times daily.     atenolol 25 MG tablet  Commonly known as:  TENORMIN  Take 25 mg by mouth daily.     chlorzoxazone 500 MG tablet  Commonly known as:  PARAFON  Take 500 mg by mouth 4 (four) times daily as needed for muscle spasms.     dextromethorphan-guaiFENesin 30-600 MG per 12 hr tablet  Commonly known as:  MUCINEX DM  Take 1 tablet by mouth every 12 (twelve) hours.     diphenhydrAMINE 25 mg capsule  Commonly known as:  BENADRYL  Take 25 mg by mouth 2 (two) times daily.     HYDROcodone-acetaminophen 7.5-500 MG per tablet  Commonly known as:  LORTAB  Take 1 tablet by mouth 4 (four) times daily.     HYDROcodone-acetaminophen 5-325 MG per tablet  Commonly known as:  NORCO/VICODIN  Take 2 tablets by mouth every 6 (six) hours as needed.     lidocaine 2 % jelly  Commonly known as:  XYLOCAINE  Apply topically as needed. To the vulva     mometasone-formoterol 200-5 MCG/ACT Aero  Commonly known as:  DULERA  Take 2 puffs first thing in am and then another 2 puffs about 12 hours later.     nicotine 14 mg/24hr patch  Commonly known as:  NICODERM CQ - dosed in mg/24 hours  Place 1 patch onto the skin daily.     sertraline 100 MG tablet  Commonly known as:  ZOLOFT  Take 200 mg by mouth daily.     trazodone 300 MG tablet  Commonly known as:  DESYREL  Take 300 mg by mouth at bedtime.     TUDORZA PRESSAIR 400 MCG/ACT Aepb  Generic drug:  Aclidinium Bromide  Inhale 1 puff into the lungs 2 (two) times daily.       Allergies  Allergen Reactions  . Nsaids     GI Upset  .  Aspirin Other (See Comments)    Gi symptoms. Does NOT take ibuprofen or other NSAIDS       Follow-up Information   Follow up with Sandrea Hughs, MD On 09/15/2013. (has appointment 11:15am)    Specialty:  Pulmonary Disease   Contact information:   [address] [place] [address] [phone]        The results of significant diagnostics from this hospitalization (including imaging, microbiology, ancillary and laboratory) are listed below for reference.    Significant Diagnostic Studies: Dg Chest 2 View  09/04/2013   *RADIOLOGY REPORT*  Clinical Data: Cough, congestion and shortness of breath for 3 weeks.  CHEST - 2 VIEW  Comparison: 09/01/2013 and 02/18/2013  Findings: Lungs are adequately inflated and demonstrate opacification of the medial right lower lobe suggesting infection as this appears  slightly worse on the lateral film compared to the prior exam.  No evidence of effusion.  Mild cardiomegaly. Remainder of the exam is unchanged.  IMPRESSION: Airspace density of the medial right lower lobe which appears slightly worse on the lateral film compared to the prior exam. This may represent infection.   Original Report Authenticated By: Elberta Fortis, M.D.   Dg Chest 2 View  09/01/2013   *RADIOLOGY REPORT*  Clinical Data: Hemoptysis.  CHEST - 2 VIEW  Comparison: 02/18/2013 and 08/06/2011  Findings: There is new increased fullness and density at the inferior aspect of the right hilum extending into the right lower lobe with new linear atelectasis at the right lung base.  Heart size and pulmonary vascularity are normal.  Upper lung zones are lucent consistent with emphysema.  No acute osseous abnormality.  IMPRESSION: Abnormal new density at the inferior aspect of the right hilum extending into the right lower lobe.  The possibility of a mass in the right hilum should be considered.   Original Report Authenticated By: Francene Boyers, M.D.   Ct Chest W Contrast  09/04/2013   *RADIOLOGY REPORT*   Clinical Data: Right hilar density on radiograph.  Hemoptysis.  CT CHEST WITH CONTRAST  Technique:  Multidetector CT imaging of the chest was performed following the standard protocol during bolus administration of intravenous contrast.  Contrast: 80mL OMNIPAQUE IOHEXOL 300 MG/ML  SOLN  Comparison: Chest radiograph 09/01/2013, 96,014  Findings: There is a right lower lobe mass lesion which erodes into the right lower lobe bronchus (image 31  of series 2 and series 3). Mass lesion measures 4.2 x 4.0 cm.  There is a obstructive pneumonitis in the right lower lobe.  There is an enlarged hilar lymph node on the right measuring 15 mm. Right lower paratracheal lymph node measures 10 mm (image 23).  No supraclavicular adenopathy.  10 mm AP window lymph node is borderline pathologic.  There are 12 mm left hilar lymph node. Subcarinal lymph node measures 12 mm.  Limited view upper abdomen demonstrates normal adrenal glands. Limited view of the liver is unremarkable.  Review of the bone windows demonstrates no aggressive osseous lesions  IMPRESSION: 1.  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 2.  Mass invades the right lower lobe bronchus. 3.  Mediastinal and hilar lymphadenopathy.  Concern for contralateral adenopathy.  Recommend FDG PET CT staging.  This was made a call report.   Original Report Authenticated By: Genevive Bi, M.D.    Microbiology: Recent Results (from the past 240 hour(s))  MRSA PCR SCREENING     Status: None   Collection Time    09/04/13  6:03 PM      Result Value Range Status   MRSA by PCR NEGATIVE  NEGATIVE Final   Comment:            The GeneXpert MRSA Assay (FDA     approved for NASAL specimens     only), is one component of a     comprehensive MRSA colonization     surveillance program. It is not     intended to diagnose MRSA     infection nor to guide or     monitor treatment for     MRSA infections.  CULTURE, EXPECTORATED SPUTUM-ASSESSMENT     Status:  None   Collection Time    09/05/13  8:42 PM      Result Value Range Status   Specimen Description SPUTUM   Final   Special  Requests NONE   Final   Sputum evaluation     Final   Value: THIS SPECIMEN IS ACCEPTABLE. RESPIRATORY CULTURE REPORT TO FOLLOW.   Report Status 09/05/2013 FINAL   Final     Labs: Basic Metabolic Panel:  Recent Labs Lab 09/04/13 1235 09/05/13 0620 09/06/13 0630  NA 137 135 137  K 3.9 4.1 3.9  CL 101 101 101  CO2 31 28 28   GLUCOSE 75 109* 101*  BUN 5* 8 6  CREATININE 0.70 0.63 0.63  CALCIUM 8.7 8.5 8.9   Liver Function Tests: No results found for this basename: AST, ALT, ALKPHOS, BILITOT, PROT, ALBUMIN,  in the last 168 hours No results found for this basename: LIPASE, AMYLASE,  in the last 168 hours No results found for this basename: AMMONIA,  in the last 168 hours CBC:  Recent Labs Lab 09/04/13 1235 09/05/13 0620  WBC 5.9 7.6  NEUTROABS 3.3  --   HGB 11.7* 11.2*  HCT 35.9* 34.5*  MCV 94.0 94.3  PLT 221 242   Cardiac Enzymes: No results found for this basename: CKTOTAL, CKMB, CKMBINDEX, TROPONINI,  in the last 168 hours BNP: BNP (last 3 results) No results found for this basename: PROBNP,  in the last 8760 hours CBG: No results found for this basename: GLUCAP,  in the last 168 hours     Signed:  Gwenyth Bender  [facility] 09/06/2013, 12:59 PM

## 2013-09-07 LAB — CULTURE, RESPIRATORY W GRAM STAIN: Culture: NORMAL

## 2013-09-15 ENCOUNTER — Ambulatory Visit (INDEPENDENT_AMBULATORY_CARE_PROVIDER_SITE_OTHER): Payer: Medicare Other | Admitting: Internal Medicine

## 2013-09-15 ENCOUNTER — Ambulatory Visit: Payer: Medicare Other | Admitting: Internal Medicine

## 2013-09-15 ENCOUNTER — Encounter: Payer: Self-pay | Admitting: Internal Medicine

## 2013-09-15 VITALS — BP 140/80 | HR 58 | Temp 98.1°F | Ht 66.0 in | Wt 174.0 lb

## 2013-09-15 DIAGNOSIS — J189 Pneumonia, unspecified organism: Secondary | ICD-10-CM

## 2013-09-15 DIAGNOSIS — R918 Other nonspecific abnormal finding of lung field: Secondary | ICD-10-CM

## 2013-09-15 DIAGNOSIS — J449 Chronic obstructive pulmonary disease, unspecified: Secondary | ICD-10-CM

## 2013-09-15 DIAGNOSIS — R222 Localized swelling, mass and lump, trunk: Secondary | ICD-10-CM

## 2013-09-15 NOTE — Progress Notes (Signed)
Subjective:    Patient ID: [name], female    DOB: 05/29/1946  MRN: [id number]    Brief patient profile:  68 yowf active smoker referred 02/10/13 by Dr Nelly Rout for copd preop eval planning vulvectomy with new onset hemoptysis summer of 2014 and suspected RLL mass.  History of Present Illness  02/10/2013 1st ov/ pulmonary eval on acei cc chronic x years cough and congestion better p tudorza and 3 x weekly neb alb with mucus typically white and thick in am but more yellow x 2-3 days  and more limited by back than breathing chronically. rec zpak x 5 days For cough > mucinex dm 1200 mg every 12 hours Symbicort 160 Take 2 puffs first thing in am and then another 2 puffs about 12 hours later.  Stop lisinopril Start Bystolic 10 mg each am  The key is to stop smoking completely   03/02/13 vulvectomy s complications   09/01/2013 acute  ov/Wert new onset hemoptysis/ severe cough on ACEi Chief Complaint  Patient presents with  . Acute Visit    Pt c/o hemoptysis on and off x 2 wks- blood was bright red at onset, but is lighter in color now. She also c/o sore throat and ear pain.   not doing as well as for month, already rx augmentin and levaquin > much better with min need for saba  rec dulera 200 Take 2 puffs first thing in am and then another 2 puffs about 12 hours later.  Continue todorza one twice daily The key is to stop smoking completely before smoking completely stops you! Try nexium 20  Takex 2  30-60 min before first meal of the day and Pepcid 20 mg one bedtime until cough is completely gone for at least a week without the need for cough suppression GERD diet Please schedule a follow up office visit in 2 weeks, sooner if needed   late add  cxr  ? Mass vs pna >  augmentin 875 bid x 10 days and stop lisinorpil  rx cozaar 50 mg daily> admit [facility] with recurrent hemoptysis and ct c/w lung ca   09/15/2013 f/u ov/Wert quit smoking 09/13/13 here for f/u of lung mass on CT Chief  Complaint  Patient presents with  . Follow-up    Pt in [place] 9/13 - 9/15 due to cough and spitting up blood, along with SOB.  Symptoms improved  discharge. Scan done and lung nodule found   was better until sev days prior to OV  And sputum has turned yellow and bloody again.  No obvious daytime variabilty or assoc   cp or chest tightness, subjective wheeze overt sinus or hb symptoms. No unusual exp hx or h/o childhood pna/ asthma or premature birth to her knowledge.   Sleeping ok without nocturnal  or early am exacerbation  of respiratory  c/o's or need for noct saba. Also denies any obvious fluctuation of symptoms with weather or environmental changes or other aggravating or alleviating factors except as outlined above   ROS  The following are not active complaints unless bolded sore throat, dysphagia, dental problems, itching, sneezing,  nasal congestion or excess/ purulent secretions, ear ache,   fever, chills, sweats, unintended wt loss, pleuritic or exertional cp, hemoptysis,  orthopnea pnd or leg swelling, presyncope, palpitations, heartburn, abdominal pain, anorexia, nausea, vomiting, diarrhea  or change in bowel or urinary habits, change in stools or urine, dysuria,hematuria,  rash, arthralgias, visual complaints, headache, numbness weakness or ataxia or problems  with walking or coordination,  change in mood/affect or memory.               Objective:   Physical Exam  Wt 02/17/2013  162  > 09/01/2013 169 > 09/15/2013  174  Wt Readings from Last 3 Encounters:  02/10/13 159 lb (72.122 kg)  01/19/13 156 lb (70.761 kg)  12/31/12 150 lb (68.04 kg)    Relatively thin wf > stated age with no longer  pseudowheeze  HEENT mild turbinate edema.  Oropharynx no thrush or excess pnd or cobblestoning.  No JVD or cervical adenopathy. Mild accessory muscle hypertrophy. Trachea midline, nl thryroid. Chest was hyperinflated by percussion with diminished breath sounds and moderate increased exp  time without wheeze. Hoover sign positive at mid inspiration. Regular rate and rhythm without murmur gallop or rub or increase P2 or edema.  Abd: no hsm, nl excursion. Ext warm without cyanosis or clubbing.     CXR  09/02/2013 :  Abnormal new density at the inferior aspect of the right hilum extending into the right lower lobe. The possibility of a mass in the right hilum should be considered.      Assessment & Plan:

## 2013-09-15 NOTE — Progress Notes (Signed)
UR chart review completed.  

## 2013-09-15 NOTE — Patient Instructions (Addendum)
Come to outpatient  registristation at [facility] 9/25 715 am  - you will be called with additional instructions  augmentin 875 bid x 10 days

## 2013-09-16 NOTE — Assessment & Plan Note (Signed)
-   HFA 75% p coaching 02/17/2013  - Spirometry   Feb 17 2013 FEV1  1.23 (49%)  Ratio 52  Congratulated on not smoking > needs to maintain off, no change in meds needed

## 2013-09-16 NOTE — Assessment & Plan Note (Signed)
-   CT 09/04/13 1. 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  2. Mass invades the right lower lobe bronchus.  3. Mediastinal and hilar lymphadenopathy. Concern for  contralateral adenopathy  Discussed in detail all the  indications, usual  risks and alternatives  relative to the benefits with patient who agrees to proceed with bronchoscopy with biopsy.

## 2013-09-16 NOTE — Assessment & Plan Note (Signed)
Possible post obst > 10 more days of augmentin rec

## 2013-09-20 ENCOUNTER — Telehealth: Payer: Self-pay | Admitting: Internal Medicine

## 2013-09-20 MED ORDER — BENZONATATE 200 MG PO CAPS: 200.0000 mg | ORAL_CAPSULE | Freq: Three times a day (TID) | ORAL | Status: DC | PRN

## 2013-09-20 NOTE — Telephone Encounter (Signed)
I spoke with pt. She is wanting to know what to expect and how she will feel after bronch? How long will it take her to wake up after bronch? Any pain? Should she expect to cough up blood? Would she be able to go home and go to sleep and not worry if she would wake up or not? Please advise MW thanks

## 2013-09-20 NOTE — Telephone Encounter (Signed)
Reviewed usual complications/ risks Requesting tessalon prn > done

## 2013-09-21 ENCOUNTER — Telehealth: Payer: Self-pay | Admitting: Internal Medicine

## 2013-09-21 NOTE — Telephone Encounter (Signed)
No labs needed - the pulmonary lab will be calling her with all the info she needs- the procedure is for Thursday 9/25 no monday

## 2013-09-21 NOTE — Telephone Encounter (Signed)
Called and spoke with pt and she stated that she has some questions about her pre-op labs.   She stated that she thought she was to have pre-op labs this week. She stated that her procedure is scheduled for Monday at [facility].   Pt wanted to make sure that she will need to have pre-op labs, if not she is fine with this.  MW please advise.  Pt is aware that we will call her back tomorrow.  Thanks  Allergies  Allergen Reactions  . Nsaids     GI Upset  . Aspirin Other (See Comments)    Gi symptoms. Does NOT take ibuprofen or other NSAIDS

## 2013-09-22 ENCOUNTER — Encounter (HOSPITAL_COMMUNITY): Payer: Self-pay

## 2013-09-22 NOTE — H&P (Signed)
Brief patient profile:  69 yowf active smoker referred 02/10/13 by Dr Nelly Rout for copd preop eval planning vulvectomy with new onset hemoptysis summer of 2014 and suspected RLL mass.   History of Present Illness   02/10/2013 1st ov/ pulmonary eval on acei cc chronic x years cough and congestion better p tudorza and 3 x weekly neb alb with mucus typically white and thick in am but more yellow x 2-3 days  and more limited by back than breathing chronically. rec zpak x 5 days For cough > mucinex dm 1200 mg every 12 hours Symbicort 160 Take 2 puffs first thing in am and then another 2 puffs about 12 hours later.   Stop lisinopril Start Bystolic 10 mg each am   The key is to stop smoking completely    03/02/13 vulvectomy s complications     09/01/2013 acute  ov/[name] new onset hemoptysis/ severe cough on ACEi Chief Complaint   Patient presents with   .  Acute Visit       Pt c/o hemoptysis on and off x 2 wks- blood was bright red at onset, but is lighter in color now. She also c/o sore throat and ear pain.     not doing as well as for month, already rx augmentin and levaquin > much better with min need for saba   rec dulera 200 Take 2 puffs first thing in am and then another 2 puffs about 12 hours later.   Continue todorza one twice daily The key is to stop smoking completely before smoking completely stops you! Try nexium 20  Takex 2  30-60 min before first meal of the day and Pepcid 20 mg one bedtime until cough is completely gone for at least a week without the need for cough suppression GERD diet Please schedule a follow up office visit in 2 weeks, sooner if needed    late add  cxr  ? Mass vs pna >  augmentin 875 bid x 10 days and stop lisinorpil  rx cozaar 50 mg daily> admit [facility] with recurrent hemoptysis and ct c/w lung ca     09/15/2013 f/u ov/[name] quit smoking 09/13/13 here for f/u of lung mass on CT Chief Complaint   Patient presents with   .  Follow-up       Pt in [place] 9/13 - 9/15 due to cough and spitting up blood, along with SOB.  Symptoms improved  discharge. Scan done and lung nodule found     was better until sev days prior to OV  And sputum has turned yellow and bloody again.   No obvious daytime variabilty or assoc   cp or chest tightness, subjective wheeze overt sinus or hb symptoms. No unusual exp hx or h/o childhood pna/ asthma or premature birth to her knowledge.     Sleeping ok without nocturnal  or early am exacerbation  of respiratory  c/o's or need for noct saba. Also denies any obvious fluctuation of symptoms with weather or environmental changes or other aggravating or alleviating factors except as outlined above    ROS  The following are not active complaints unless bolded sore throat, dysphagia, dental problems, itching, sneezing,  nasal congestion or excess/ purulent secretions, ear ache,   fever, chills, sweats, unintended wt loss, pleuritic or exertional cp, hemoptysis,  orthopnea pnd or leg swelling, presyncope, palpitations, heartburn, abdominal pain, anorexia, nausea, vomiting, diarrhea  or change in bowel or urinary habits, change in stools or urine,  dysuria,hematuria,  rash, arthralgias, visual complaints, headache, numbness weakness or ataxia or problems with walking or coordination,  change in mood/affect or memory.                    Objective:     Physical Exam   Wt 02/17/2013  162  > 09/01/2013 169 > 09/15/2013  174   Wt Readings from Last 3 Encounters:   02/10/13  159 lb (72.122 kg)   01/19/13  156 lb (70.761 kg)   12/31/12  150 lb (68.04 kg)       Relatively thin wf > stated age with no longer  pseudowheeze   HEENT mild turbinate edema.  Oropharynx no thrush or excess pnd or cobblestoning.  No JVD or cervical adenopathy. Mild accessory muscle hypertrophy. Trachea midline, nl thryroid. Chest was hyperinflated by percussion with diminished breath sounds and moderate increased exp time without wheeze. Hoover  sign positive at mid inspiration. Regular rate and rhythm without murmur gallop or rub or increase P2 or edema.  Abd: no hsm, nl excursion. Ext warm without cyanosis or clubbing.       CXR  09/02/2013 :   Abnormal new density at the inferior aspect of the right hilum extending into the right lower lobe. The possibility of a mass in the right hilum should be considered.         Assessment & Plan:        Right lower lobe lung mass      - CT 09/04/13 1. 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   2. Mass invades the right lower lobe bronchus.   3. Mediastinal and hilar lymphadenopathy. Concern for   contralateral adenopathy   Discussed in detail all the  indications, usual  risks and alternatives  relative to the benefits with patient who agrees to proceed with bronchoscopy with biopsy.           COPD GOLD III   - HFA 75% p coaching 02/17/2013   -  Spirometry   Feb 17 2013 FEV1  1.23 (49%)  Ratio 52   Congratulated on not smoking > needs to maintain off, no change in meds needed         Pneumonia       Possible post obst > 10 more days of augmentin rec        09/23/2013 FOB re eval:  No change hx or exam, min streaky hemoptysis daily, no sob at rest.      Sandrea Hughs, MD Pulmonary and Critical Care Medicine [facility] Cell [phone] After 5:30 PM or weekends, call [phone]

## 2013-09-22 NOTE — Telephone Encounter (Signed)
ATC the pt, NA and no option to leave a msg

## 2013-09-23 ENCOUNTER — Ambulatory Visit (HOSPITAL_COMMUNITY)
Admission: RE | Admit: 2013-09-23 | Discharge: 2013-09-23 | Disposition: A | Discharge status: Home / Self Care | Payer: Medicare Other | Source: Ambulatory Visit | Attending: Internal Medicine | Admitting: Internal Medicine

## 2013-09-23 ENCOUNTER — Encounter (HOSPITAL_COMMUNITY)
Admission: RE | Disposition: A | Discharge status: Home / Self Care | Payer: Self-pay | Source: Ambulatory Visit | Attending: Internal Medicine

## 2013-09-23 DIAGNOSIS — R222 Localized swelling, mass and lump, trunk: Secondary | ICD-10-CM

## 2013-09-23 DIAGNOSIS — J4489 Other specified chronic obstructive pulmonary disease: Secondary | ICD-10-CM | POA: Insufficient documentation

## 2013-09-23 DIAGNOSIS — J449 Chronic obstructive pulmonary disease, unspecified: Secondary | ICD-10-CM | POA: Insufficient documentation

## 2013-09-23 DIAGNOSIS — R599 Enlarged lymph nodes, unspecified: Secondary | ICD-10-CM | POA: Insufficient documentation

## 2013-09-23 DIAGNOSIS — C3490 Malignant neoplasm of unspecified part of unspecified bronchus or lung: Secondary | ICD-10-CM | POA: Insufficient documentation

## 2013-09-23 DIAGNOSIS — R918 Other nonspecific abnormal finding of lung field: Secondary | ICD-10-CM

## 2013-09-23 DIAGNOSIS — F17200 Nicotine dependence, unspecified, uncomplicated: Secondary | ICD-10-CM | POA: Insufficient documentation

## 2013-09-23 HISTORY — PX: VIDEO BRONCHOSCOPY: SHX5072

## 2013-09-23 SURGERY — VIDEO BRONCHOSCOPY WITHOUT FLUORO
Anesthesia: Moderate Sedation | Laterality: Bilateral

## 2013-09-23 MED ORDER — PHENYLEPHRINE HCL 0.25 % NA SOLN
NASAL | Status: DC | PRN
  Administered 2013-09-23: 2 via NASAL

## 2013-09-23 MED ORDER — PHENYLEPHRINE HCL 0.25 % NA SOLN
1.0000 | Freq: Four times a day (QID) | NASAL | Status: DC | PRN
  Filled 2013-09-23: qty 15

## 2013-09-23 MED ORDER — LIDOCAINE HCL 1 % IJ SOLN
INTRAMUSCULAR | Status: DC | PRN
  Administered 2013-09-23: 6 mL via RESPIRATORY_TRACT

## 2013-09-23 MED ORDER — EPINEPHRINE HCL 0.1 MG/ML IJ SOSY
PREFILLED_SYRINGE | INTRAMUSCULAR | Status: DC | PRN
  Administered 2013-09-23: 1 via INTRAVENOUS

## 2013-09-23 MED ORDER — LIDOCAINE HCL 2 % EX GEL
Freq: Once | CUTANEOUS | Status: DC
  Filled 2013-09-23: qty 5

## 2013-09-23 MED ORDER — LIDOCAINE HCL 2 % EX GEL
CUTANEOUS | Status: DC | PRN
  Administered 2013-09-23: 2

## 2013-09-23 MED ORDER — SODIUM CHLORIDE 0.9 % IV SOLN
INTRAVENOUS | Status: DC
  Administered 2013-09-23: 09:00:00 via INTRAVENOUS

## 2013-09-23 MED ORDER — MIDAZOLAM HCL 10 MG/2ML IJ SOLN
INTRAMUSCULAR | Status: AC
  Filled 2013-09-23: qty 4

## 2013-09-23 MED ORDER — MIDAZOLAM HCL 10 MG/2ML IJ SOLN
INTRAMUSCULAR | Status: DC | PRN
  Administered 2013-09-23: 5 mg via INTRAVENOUS

## 2013-09-23 MED ORDER — MEPERIDINE HCL 100 MG/ML IJ SOLN
INTRAMUSCULAR | Status: AC
  Filled 2013-09-23: qty 2

## 2013-09-23 MED ORDER — MEPERIDINE HCL 25 MG/ML IJ SOLN
INTRAMUSCULAR | Status: DC | PRN
  Administered 2013-09-23 (×2): 25 mg via INTRAVENOUS

## 2013-09-23 NOTE — Telephone Encounter (Signed)
ATC x2  

## 2013-09-23 NOTE — Progress Notes (Signed)
Video bronchoscopy done Intervention bronchial washing Intervention bronchial biopsy Procedure tolerated well  Jacqulynn Cadet RRT

## 2013-09-23 NOTE — Op Note (Signed)
Bronchoscopy Procedure Note  Date of Operation: 09/23/2013   Pre-op Diagnosis: Lung ca  Post-op Diagnosis: Same  Surgeon: Sandrea Hughs  Anesthesia: Monitored Local Anesthesia with Sedation  Operation: Video Flexible fiberoptic bronchoscopy, diagnostic   Findings:  Endobronchial mass, RLL orifice 10% obst  Specimen: Bronchial Lavage/ Endobronchial bx  Estimated Blood Loss: 30 cc  Complications: endobronchial bleeding, controlled with epi  Indications and History: See updated H and P same date. The risks, benefits, complications, treatment options and expected outcomes were discussed with the patient.  The possibilities of reaction to medication, pulmonary aspiration, perforation of a viscus, bleeding, failure to diagnose a condition and creating a complication requiring transfusion or operation were discussed with the patient who freely signed the consent.    Description of Procedure: The patient was re-examined in the bronchoscopy suite and the site of surgery properly noted/marked.  The patient was identified  and the procedure verified as Flexible Fiberoptic Bronchoscopy.  A Time Out was held and the above information confirmed.   After the induction of topical nasopharyngeal anesthesia, the patient was positioned  and the bronchoscope was passed through the R naris. The vocal cords were visualized and  1% buffered lidocaine 5 ml was topically placed onto the cords. The cords were nl. The scope was then passed into the trachea.  1% buffered lidocaine given topically. Airways inspected bilaterally to the subsegmental level with the following findings:  All airways nl to subsegmental level bilaterally with the exception of the RLL below the takeoff of the sup segment was 100% obst with fungating vascular mass c/w bronchogenic ca  Interventions 1) Bronchial Lavage for cytology 2) Endobronchial Bx for histology     The Patient was taken to the Endoscopy Recovery area in  satisfactory condition.  Attestation: I performed the procedure.  Sandrea Hughs, MD Pulmonary and Critical Care Medicine [facility] Cell [phone] After 5:30 PM or weekends, call [phone]

## 2013-09-24 ENCOUNTER — Encounter (HOSPITAL_COMMUNITY): Payer: Self-pay | Admitting: Internal Medicine

## 2013-09-24 NOTE — Telephone Encounter (Signed)
Pt has already had procedure done. She needed nothing further

## 2013-09-25 ENCOUNTER — Encounter: Payer: Self-pay | Admitting: Internal Medicine

## 2013-09-27 ENCOUNTER — Encounter: Payer: Self-pay | Admitting: Internal Medicine

## 2013-09-27 ENCOUNTER — Other Ambulatory Visit: Payer: Self-pay | Admitting: Internal Medicine

## 2013-09-27 ENCOUNTER — Telehealth: Payer: Self-pay | Admitting: *Deleted

## 2013-09-27 ENCOUNTER — Telehealth: Payer: Self-pay | Admitting: Internal Medicine

## 2013-09-27 DIAGNOSIS — R918 Other nonspecific abnormal finding of lung field: Secondary | ICD-10-CM

## 2013-09-27 DIAGNOSIS — J189 Pneumonia, unspecified organism: Secondary | ICD-10-CM

## 2013-09-27 MED ORDER — AMOXICILLIN-POT CLAVULANATE 875-125 MG PO TABS: 1.0000 | ORAL_TABLET | Freq: Two times a day (BID) | ORAL | Status: DC

## 2013-09-27 NOTE — Assessment & Plan Note (Signed)
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

## 2013-09-27 NOTE — Telephone Encounter (Signed)
Spoke with patient, patient would like to know if her lung is blocked with cancer or pneumonia?  Per Dr Sherene Sires-  Patients lung is blocked with Cancer which is causing the pneumonia  Spoke with patient advised her of above and nothing further needed at this time.

## 2013-09-27 NOTE — Telephone Encounter (Signed)
Spoke with pt regarding appt for mtoc 09/30/13 at 2:00 arrive at 1:45.  She verbalized understanding of time and place of appt

## 2013-09-28 ENCOUNTER — Telehealth: Payer: Self-pay | Admitting: Internal Medicine

## 2013-09-28 ENCOUNTER — Telehealth: Payer: Self-pay | Admitting: *Deleted

## 2013-09-28 NOTE — Telephone Encounter (Signed)
Pt called [name], Dr. Asa Lente desk nurse today.  She had questions about appt's.  I called pt back and explained up coming appt.  She verbalized understanding of time and place of appt

## 2013-09-28 NOTE — Telephone Encounter (Signed)
Last OV note faxed. Pt is aware and advised if she wants all of her records sent she will need to sign a release. Pt aware. Carron Curie, CMA

## 2013-09-28 NOTE — Telephone Encounter (Signed)
C/D 09/28/13 for appt. 09/30/13 °

## 2013-09-29 ENCOUNTER — Encounter: Payer: Self-pay | Admitting: *Deleted

## 2013-09-29 NOTE — Progress Notes (Unsigned)
Abstract: RLL infiltrate noted 2012 and followed with scans.  Pt presents to ED with hemoptysis 09/04/13  CT Chest: 09/04/2013 CT CHEST WITH CONTRAST  Technique: Multidetector CT imaging of the chest was performed  following the standard protocol during bolus administration of  intravenous contrast.  Contrast: 80mL OMNIPAQUE IOHEXOL 300 MG/ML SOLN  Comparison: Chest radiograph 09/01/2013, 96,014  Findings: There is a right lower lobe mass lesion which erodes into  the right lower lobe bronchus (image 31 of series 2 and series 3).  Mass lesion measures 4.2 x 4.0 cm. There is a obstructive  pneumonitis in the right lower lobe.  There is an enlarged hilar lymph node on the right measuring 15 mm.  Right lower paratracheal lymph node measures 10 mm (image 23). No  supraclavicular adenopathy. 10 mm AP window lymph node is  borderline pathologic. There are 12 mm left hilar lymph node.  Subcarinal lymph node measures 12 mm.  Limited view upper abdomen demonstrates normal adrenal glands.  Limited view of the liver is unremarkable.  Review of the bone windows demonstrates no aggressive osseous  lesions  IMPRESSION:  1. 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  2. Mass invades the right lower lobe bronchus.  3. Mediastinal and hilar lymphadenopathy. Concern for  contralateral adenopathy. Recommend FDG PET CT staging.  PMH: H/O: stroke times 3                                    2012, 2000,   Hypertension Anxiety  PTSD (post-traumatic stress disorder) Depression  SVD (spontaneous vaginal delivery)  x 1    Smoker  Osteoarthritis  knees, hips hands,  Shortness of breath  Pain  SEVERE PAIN BACK, RIGHT HIP AND KNEES--HARRINGTON RODS AND CERVICAL PLATES--USES WALKER OR CANE WHEN AMBULATING - GOES TO A PAIN CLINIC-STATES SHE NEEDS RT HIP AND BOTH KNEES REPLACED. PT STATES SHE WAS TOLD THE CEMENT AROUND THE HARRINGTON RODS IS CRACKED.   Hot flashes, menopausal  SEVERE   Family history of anesthesia complication  equipment failure caused problem  Complication of anesthesia  03/02/13  severe headache,vertigo postop  COPD (chronic obstructive pulmonary disease)  stable  Stroke  2012  residual tremors   Medications: ALPRAZolam (Tab) XANAX 1 MG Take 1 mg by mouth 4 (four) times daily. Aclidinium Bromide (Aerosol Powder, Breath Activtivatede) Aclidinium Bromide 400 MCG/ACT Inhale 1 puff into the lungs 2 (two) times daily. Albuterol Sulfate (Nebu Soln) PROVENTIL (2.5 MG/3ML) 0.083% Take 2.5 mg by nebulization as needed for wheezing. Albuterol Sulfate (Nebu Soln) PROVENTIL (5 MG/ML) 0.5% Take 2.5 mg by nebulization every 6 (six) hours as needed for wheezing. Amoxicillin-Pot Clavulanate (Tab) AUGMENTIN 875-125 MG Take 1 tablet by mouth 2 (two) times daily. Atenolol (Tab) TENORMIN 25 MG Take 25 mg by mouth daily. Benzonatate (Cap) TESSALON 200 MG Take 1 capsule (200 mg total) by mouth 3 (three) times daily as needed for cough. Chlorzoxazone (Tab) PARAFON 500 MG Take 500 mg by mouth 4 (four) times daily as needed for muscle spasms. Dextromethorphan-Guaifenesin (Tablet SR 12 hr) MUCINEX DM 30-600 MG Take 1 tablet by mouth every 12 (twelve) hours. DiphenhydrAMINE HCl (Cap) BENADRYL 25 mg Take 25 mg by mouth 2 (two) times daily. Hydrocodone-Acetaminophen (Tab) NORCO 10-325 MG Take 1 tablet by mouth every 6 (six) hours as needed for pain. Mometasone Furo-Formoterol Fum (Aerosol) DULERA 200-5 MCG/ACT Take 2 puffs first thing in am and then  another 2 puffs about 12 hours later. Nicotine (Patch 24 hr) NICODERM CQ - dosed in mg/24 hours 14 mg/24hr Place 1 patch onto the skin daily. Sertraline HCl (Tab) ZOLOFT 100 MG Take 200 mg by mouth daily. TraZODone HCl (Tab) DESYREL 300 MG Take 300 mg by mouth at bedtime.

## 2013-09-30 ENCOUNTER — Ambulatory Visit (HOSPITAL_BASED_OUTPATIENT_CLINIC_OR_DEPARTMENT_OTHER): Payer: Medicare Other | Admitting: Internal Medicine

## 2013-09-30 ENCOUNTER — Ambulatory Visit
Admission: RE | Admit: 2013-09-30 | Discharge: 2013-09-30 | Disposition: A | Discharge status: Home / Self Care | Payer: Medicare Other | Source: Ambulatory Visit | Attending: Radiation Oncology | Admitting: Radiation Oncology

## 2013-09-30 ENCOUNTER — Telehealth: Payer: Self-pay | Admitting: Medical Oncology

## 2013-09-30 ENCOUNTER — Encounter: Payer: Self-pay | Admitting: *Deleted

## 2013-09-30 ENCOUNTER — Telehealth: Payer: Self-pay | Admitting: Internal Medicine

## 2013-09-30 ENCOUNTER — Ambulatory Visit: Payer: Medicare Other | Admitting: Physical Therapy

## 2013-09-30 ENCOUNTER — Encounter: Payer: Self-pay | Admitting: Internal Medicine

## 2013-09-30 VITALS — BP 153/97 | HR 65 | Temp 98.1°F | Resp 17 | Ht 66.0 in | Wt 175.2 lb

## 2013-09-30 DIAGNOSIS — J4489 Other specified chronic obstructive pulmonary disease: Secondary | ICD-10-CM

## 2013-09-30 DIAGNOSIS — R599 Enlarged lymph nodes, unspecified: Secondary | ICD-10-CM

## 2013-09-30 DIAGNOSIS — J449 Chronic obstructive pulmonary disease, unspecified: Secondary | ICD-10-CM

## 2013-09-30 DIAGNOSIS — I10 Essential (primary) hypertension: Secondary | ICD-10-CM

## 2013-09-30 DIAGNOSIS — R918 Other nonspecific abnormal finding of lung field: Secondary | ICD-10-CM

## 2013-09-30 DIAGNOSIS — C3430 Malignant neoplasm of lower lobe, unspecified bronchus or lung: Secondary | ICD-10-CM

## 2013-09-30 NOTE — Progress Notes (Signed)
   Thoracic Treatment Summary Name:[name] Date:09/30/2013 DOB:1946-02-07 Your Medical Team Medical Oncologist: Dr. Arbutus Ped  Radiation Oncologist: Dr. Mitzi Hansen Pulmonologist: Dr. Sherene Sires Type and Stage of Lung Cancer Non-Small Cell Carcinoma:  Squamous Cell Clinical Stage:  III  Clinical stage is based on radiology exams.  Pathological stage will be determined after surgery.  Staging is based on the size of the tumor, involvement of lymph nodes or not, and whether or not the cancer center has spread. Recommendations Recommendations: MRI Brain, Concurrent chemoradiation therapy  These recommendations are based on information available as of today's consult.  This is subject to change depending further testing or exams. Next Steps Next Step: 1. Medical Oncology will set up MRI brain and chemotherapy appointments 2. Radiation Oncology will set up simulation and other radiation appointments  Barriers to Care What do you perceive as a potential barrier that may prevent you from receiving your treatment plan? Nothing perceived at this time Resource: NCI Booklet given and briefly explained Cancer Center resources given Cancer Care [email or web address] [phone]   Questions Willette Pa, RN BSN Thoracic Oncology Nurse Navigator at [phone]  [name] is a nurse navigator that is available to assist you through your cancer journey.  She can answer your questions and/or provide resources regarding your treatment plan, emotional support, or financial concerns.

## 2013-09-30 NOTE — Telephone Encounter (Signed)
GAve pt appt for MRI and PEt Scan 10/9, needs MD visit that day left message with nurse

## 2013-09-30 NOTE — Progress Notes (Signed)
[facility] Clinical Social Work  Clinical Social Work met with patient, patient's son, and medical oncologist for assessment of psychosocial needs.  Medical oncologist reviewed diagnosis and treatment plan.  [name] expressed hesitation regarding chemotherapy, but was agreeable to plan for follow up next Thursday.  Patient is retired and lives alone.  She reports difficulty being active due to chronic pain and difficulty breathing.  She recently quit smoking three weeks ago.  She reported she experiences anxiety and she typically utilizes prayer as a form of support/stress reduction.  CSW briefly discussed CSW role and support services; CSW encouraged patient to call with any questions or concerns.    [name], MSW, LCSW Clinical Social Worker [facility] [phone]

## 2013-09-30 NOTE — Telephone Encounter (Signed)
Faxed BMP to [facility]

## 2013-09-30 NOTE — Progress Notes (Addendum)
[facility] [phone]   [phone] Multidisciplinary thoracic oncology clinic (MTOC) CONSULT NOTE  REFERRING PHYSICIAN:Dr. Sandrea Hughs  REASON FOR CONSULTATION:  67 years old white female recently diagnosed with lung cancer.  HPI [name] is a 67 y.o. female with a long history of smoking and past medical history significant for COPD, hypertension, anxiety, pneumonia, chronic back pain and history of stroke. She has been complaining of shortness of breath and cough for almost 3 months and was getting worse over the last 4 weeks with coughing of blood. She had chest x-ray performed on 09/04/2013 and it showed airspace density of the medial right lower lobe which appears slightly worse on the lateral film compared to the prior exam. This was followed by CT scan of the chest on the same day and it showed a right lower lobe mass lesion which erodes into the right lower lobe bronchus. Mass lesion measures 4.2 x 4.0 cm. There is a obstructive pneumonitis in the right lower lobe. There is an enlarged hilar lymph node on the right measuring 15 mm. Right lower paratracheal lymph node measures 10 mm. No supraclavicular adenopathy. 10 mm AP window lymph node is borderline pathologic. There are 12 mm left hilar lymph node. Subcarinal lymph node measures 12 mm. The patient was seen by Dr. Sherene Sires and on 09/23/2013 she underwent video flexible fiberoptic bronchoscopy with endobronchial biopsy as well as bronchial lavage. All airways nl to subsegmental level bilaterally with the exception of the RLL below the takeoff of the sup segment was 100% obst with fungating vascular mass c/w bronchogenic carcinoma. The final pathology of the endobronchial biopsy of the right lower lobe (Accession: [id number]) showed invasive squamous cell carcinoma.  Dr. Sherene Sires kindly referred the patient to me today for further evaluation and recommendation regarding treatment of her condition. The  patient is scheduled for a PET scan on 10/07/2013. She continues to complain of cough and occasional hemoptysis especially in the morning hours. The patient has baseline shortness of breath increased with exertion and central chest tightness that time. She denied having any significant weight but continues to have night sweats. The patient denied having any headache or visual changes.  Family history significant for a father with heart disease and hypertension, mother had colon cancer. The patient is single and has one son, [name] who accompanied her today. She is to work as a Museum/gallery exhibitions officer. She has a history of smoking more than 2 packs per day for around 30 years quit 3 weeks ago.  She has no history of alcohol or drug abuse.  @SFHPI @  Past Medical History  Diagnosis Date  . H/O: stroke 2012, 2000,     X3  . Hypertension   . Anxiety   . PTSD (post-traumatic stress disorder)   . Depression   . SVD (spontaneous vaginal delivery)     x 1  . Smoker   . Osteoarthritis     knees, hips hands,   . Shortness of breath   . Pain     SEVERE PAIN BACK, RIGHT HIP AND KNEES--HARRINGTON RODS AND CERVICAL PLATES--USES WALKER OR CANE WHEN AMBULATING - GOES TO A PAIN CLINIC-STATES SHE NEEDS RT HIP AND BOTH KNEES REPLACED. PT STATES SHE WAS TOLD THE CEMENT AROUND THE HARRINGTON RODS IS CRACKED.  [name] Hot flashes, menopausal     SEVERE  . Family history of anesthesia complication     equipment failure caused problem  . Complication of anesthesia 03/02/13  severe headache,vertigo postop  . COPD (chronic obstructive pulmonary disease)     stable  . Stroke 2012    residual tremors    Past Surgical History  Procedure Laterality Date  . Carpal tunnel release      BILATERAL  . Nasal sinus surgery    . Lumbar fusion      L4-S1  . Cervical fusion    . Hernia repair    . Salpingoophorectomy Right   . Vaginal hysterectomy      TAH w/ ovary removal  . Transthoracic echocardiogram   12-03-2011    LVSF NORMAL/ EF 60-65%  . Vulvectomy N/A 03/02/2013    Procedure: WIDE LOCAL EXCISION VULVAR;  Surgeon: Rejeana Brock A. Duard Brady, MD;  Location: [facility];  Service: Gynecology;  Laterality: N/A;  . Co2 laser application N/A 05/13/2013    Procedure: LASER APPLICATION OF THE VULVA;  Surgeon: Laurette Schimke, MD;  Location: [facility];  Service: Gynecology;  Laterality: N/A;  . Video bronchoscopy Bilateral 09/23/2013    Procedure: VIDEO BRONCHOSCOPY WITHOUT FLUORO;  Surgeon: Nyoka Cowden, MD;  Location: [facility];  Service: Cardiopulmonary;  Laterality: Bilateral;    Family History  Problem Relation Age of Onset  . Cancer Mother     COLON  . Heart disease Father   . Diabetes Maternal Aunt   . Cancer Maternal Grandfather     KIDNEY   . Diabetes Maternal Grandfather   . Hypertension Maternal Grandfather   . Heart disease Maternal Grandfather     Social History History  Substance Use Topics  . Smoking status: Former Smoker -- 0.50 packs/day for 35 years    Types: Cigarettes    Quit date: 09/13/2013  . Smokeless tobacco: Never Used     Comment: 4cigs per day 02/17/13  . Alcohol Use: No    Allergies  Allergen Reactions  . Prednisone     insomnia  . Nsaids     GI Upset  . Aspirin Other (See Comments)    Gi symptoms. Does NOT take ibuprofen or other NSAIDS  . Dulera [Mometasone Furo-Formoterol Fum] Palpitations    Panting " my body was twisted inside and out"    Current Outpatient Prescriptions  Medication Sig Dispense Refill  . Aclidinium Bromide (TUDORZA PRESSAIR) 400 MCG/ACT AEPB Inhale 1 puff into the lungs 2 (two) times daily.      [name] albuterol (PROVENTIL) (2.5 MG/3ML) 0.083% nebulizer solution Take 2.5 mg by nebulization as needed for wheezing.      [name] albuterol (PROVENTIL) (5 MG/ML) 0.5% nebulizer solution Take 2.5 mg by nebulization every 6 (six) hours as needed for wheezing.      [name] ALPRAZolam (XANAX) 1 MG tablet Take 1 mg by mouth 4 (four) times  daily.       [name] amoxicillin-clavulanate (AUGMENTIN) 875-125 MG per tablet Take 1 tablet by mouth 2 (two) times daily.  20 tablet  0  . atenolol (TENORMIN) 25 MG tablet Take 25 mg by mouth daily.      . chlorzoxazone (PARAFON) 500 MG tablet Take 500 mg by mouth 4 (four) times daily as needed for muscle spasms.      [name] dextromethorphan-guaiFENesin (MUCINEX DM) 30-600 MG per 12 hr tablet Take 1 tablet by mouth every 12 (twelve) hours.      . diphenhydrAMINE (BENADRYL) 25 mg capsule Take 25 mg by mouth 2 (two) times daily.       [name] HYDROcodone-acetaminophen (NORCO) 10-325 MG per tablet Take 1 tablet by mouth  every 6 (six) hours as needed for pain.      . nicotine (NICODERM CQ - DOSED IN MG/24 HOURS) 14 mg/24hr patch Place 1 patch onto the skin daily.  28 patch  0  . sertraline (ZOLOFT) 100 MG tablet Take 200 mg by mouth daily.       . trazodone (DESYREL) 300 MG tablet Take 300 mg by mouth at bedtime.      . benzonatate (TESSALON) 200 MG capsule Take 1 capsule (200 mg total) by mouth 3 (three) times daily as needed for cough.  30 capsule  1   No current facility-administered medications for this visit.    Review of Systems  Constitutional: negative Eyes: negative Ears, nose, mouth, throat, and face: negative Respiratory: positive for cough, dyspnea on exertion and hemoptysis Cardiovascular: negative Gastrointestinal: negative Genitourinary:negative Integument/breast: negative Hematologic/lymphatic: negative Musculoskeletal:negative Neurological: negative Behavioral/Psych: negative Endocrine: negative Allergic/Immunologic: negative  Physical Exam  RUE:AVWUJ, healthy, no distress, well nourished and well developed SKIN: skin color, texture, turgor are normal, no rashes or significant lesions HEAD: Normocephalic, No masses, lesions, tenderness or abnormalities EYES: normal, PERRLA EARS: External ears normal OROPHARYNX:no exudate, no erythema and lips, buccal mucosa, and tongue normal    NECK: supple, no adenopathy, no JVD LYMPH:  no palpable lymphadenopathy, no hepatosplenomegaly BREAST:not examined LUNGS: clear to auscultation , and palpation HEART: regular rate & rhythm, no murmurs and no gallops ABDOMEN:abdomen soft, non-tender, normal bowel sounds and no masses or organomegaly BACK: Back symmetric, no curvature., No CVA tenderness EXTREMITIES:no joint deformities, effusion, or inflammation, no edema, no skin discoloration, no clubbing, no cyanosis  NEURO: alert & oriented x 3 with fluent speech, no focal motor/sensory deficits  PERFORMANCE STATUS: ECOG 1  LABORATORY DATA: Lab Results  Component Value Date   WBC 7.6 09/05/2013   HGB 11.2* 09/05/2013   HCT 34.5* 09/05/2013   MCV 94.3 09/05/2013   PLT 242 09/05/2013      Chemistry      Component Value Date/Time   NA 137 09/06/2013 0630   K 3.9 09/06/2013 0630   CL 101 09/06/2013 0630   CO2 28 09/06/2013 0630   BUN 6 09/06/2013 0630   CREATININE 0.63 09/06/2013 0630      Component Value Date/Time   CALCIUM 8.9 09/06/2013 0630   ALKPHOS 79 08/06/2011 0101   AST 13 08/06/2011 0101   ALT 6 08/06/2011 0101   BILITOT 0.4 08/06/2011 0101       RADIOGRAPHIC STUDIES: Dg Chest 2 View  09/04/2013   *RADIOLOGY REPORT*  Clinical Data: Cough, congestion and shortness of breath for 3 weeks.  CHEST - 2 VIEW  Comparison: 09/01/2013 and 02/18/2013  Findings: Lungs are adequately inflated and demonstrate opacification of the medial right lower lobe suggesting infection as this appears slightly worse on the lateral film compared to the prior exam.  No evidence of effusion.  Mild cardiomegaly. Remainder of the exam is unchanged.  IMPRESSION: Airspace density of the medial right lower lobe which appears slightly worse on the lateral film compared to the prior exam. This may represent infection.   Original Report Authenticated By: Elberta Fortis, M.D.   Dg Chest 2 View  09/01/2013   *RADIOLOGY REPORT*  Clinical Data: Hemoptysis.  CHEST - 2 VIEW  Comparison:  02/18/2013 and 08/06/2011  Findings: There is new increased fullness and density at the inferior aspect of the right hilum extending into the right lower lobe with new linear atelectasis at the right lung base.  Heart size and  pulmonary vascularity are normal.  Upper lung zones are lucent consistent with emphysema.  No acute osseous abnormality.  IMPRESSION: Abnormal new density at the inferior aspect of the right hilum extending into the right lower lobe.  The possibility of a mass in the right hilum should be considered.   Original Report Authenticated By: Francene Boyers, M.D.   Ct Chest W Contrast  09/04/2013   *RADIOLOGY REPORT*  Clinical Data: Right hilar density on radiograph.  Hemoptysis.  CT CHEST WITH CONTRAST  Technique:  Multidetector CT imaging of the chest was performed following the standard protocol during bolus administration of intravenous contrast.  Contrast: 80mL OMNIPAQUE IOHEXOL 300 MG/ML  SOLN  Comparison: Chest radiograph 09/01/2013, 96,014  Findings: There is a right lower lobe mass lesion which erodes into the right lower lobe bronchus (image 31  of series 2 and series 3). Mass lesion measures 4.2 x 4.0 cm.  There is a obstructive pneumonitis in the right lower lobe.  There is an enlarged hilar lymph node on the right measuring 15 mm. Right lower paratracheal lymph node measures 10 mm (image 23).  No supraclavicular adenopathy.  10 mm AP window lymph node is borderline pathologic.  There are 12 mm left hilar lymph node. Subcarinal lymph node measures 12 mm.  Limited view upper abdomen demonstrates normal adrenal glands. Limited view of the liver is unremarkable.  Review of the bone windows demonstrates no aggressive osseous lesions  IMPRESSION: 1.  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 2.  Mass invades the right lower lobe bronchus. 3.  Mediastinal and hilar lymphadenopathy.  Concern for contralateral adenopathy.  Recommend FDG PET CT staging.  This was made a call  report.   Original Report Authenticated By: Genevive Bi, M.D.    ASSESSMENT: This is a very pleasant 67 years old white female recently diagnosed with stage IIIB (T2a, N3, M0) non-small cell lung cancer consistent with squamous cell carcinoma presented with right lower lobe lung mass as well as mediastinal lymphadenopathy and contralateral hilar disease.  PLAN: I had a lengthy discussion with the patient and her son today about her current disease stage, prognosis and treatment options. I will complete the staging workup I ordered an MRI of the brain. The patient had a PET scan scheduled on 10/07/2013. If there is no evidence for metastatic disease on the brain MRI as well as a PET scan, We will consider the patient for a course of concurrent chemoradiation with weekly carboplatin for AUC of 2 and paclitaxel 45 mg/M2. I discussed with the patient adverse effect of the chemotherapy including but not limited to alopecia, myelosuppression, nausea and vomiting, peripheral neuropathy, liver or renal dysfunction. The patient will be seen later today by Dr. Mitzi Hansen for evaluation and discussion of the radiotherapy option. I will arrange for the patient to come back for follow up visit in one week for evaluation and discussion of her MRI result as well as the PET scan and more detailed discussion of her chemotherapy. I gave the patient and her son the time to ask questions and I answered them completely to their satisfactions. She was advised to call immediately if she has any concerning symptoms in the interval.  The patient voices understanding of current disease status and treatment options and is in agreement with the current care plan.   All questions were answered. The patient knows to call the clinic with any problems, questions or concerns. We can certainly see the patient much sooner if necessary.  Thank you so much for allowing me to participate in the care of [name]. I will  continue to follow up the patient with you and assist in her care.  I spent 40 minutes counseling the patient face to face. The total time spent in the appointment was 60 minutes.  Lessie Manigo K. 09/30/2013, 2:48 PM

## 2013-10-01 ENCOUNTER — Telehealth: Payer: Self-pay | Admitting: *Deleted

## 2013-10-01 NOTE — Telephone Encounter (Signed)
Called pt unable to leave message regarding mtoc appt

## 2013-10-02 ENCOUNTER — Encounter: Payer: Self-pay | Admitting: Internal Medicine

## 2013-10-02 DIAGNOSIS — C3490 Malignant neoplasm of unspecified part of unspecified bronchus or lung: Secondary | ICD-10-CM | POA: Insufficient documentation

## 2013-10-02 HISTORY — DX: Malignant neoplasm of unspecified part of unspecified bronchus or lung: C34.90

## 2013-10-02 NOTE — Patient Instructions (Signed)
I will order the MRI of the brain in addition to the already Scheduled PET scan for staging of your disease. Follow up visit in one week for more detailed discussion of her treatment

## 2013-10-04 ENCOUNTER — Telehealth: Payer: Self-pay | Admitting: *Deleted

## 2013-10-04 DIAGNOSIS — C3430 Malignant neoplasm of lower lobe, unspecified bronchus or lung: Secondary | ICD-10-CM | POA: Insufficient documentation

## 2013-10-04 NOTE — Telephone Encounter (Signed)
Spoke with son regarding appt for mtoc 10/07/13 at 1:45 arrive at 1:30

## 2013-10-04 NOTE — Progress Notes (Signed)
Radiation Oncology         [phone] ________________________________  Name: [name] MRN: [id number]  Date: 09/30/2013  DOB: 1946-06-21  XB:MWUX, Ian Malkin, MD  Nyoka Cowden, MD   Si Gaul, MD  REFERRING PHYSICIAN: Nyoka Cowden, MD   DIAGNOSIS: Non-small cell lung cancer   HISTORY OF PRESENT ILLNESS::[name] is a 67 y.o. female who is seen for an initial consultation visit.  the patient is seen today in multidisciplinary thoracic clinic. She indicates that she has complained of some shortness of breath and a cough for approximately 3 months. This has worsened over the last month and she does describe some hemoptysis. The patient had a chest x-ray performed on 09/04/2013 which showed a density within the right lower lobe which appeared worse than prior exam. This led to a CT scan of the chest on the same day which showed a right lower lobe mass consistent with a primary bronchogenic carcinoma. The mass lesion measured 4.2 x 4.0 cm. An obstructive pneumonitis was present within the right lower lobe. The mass appeared to invade the right lower lobe bronchus. Mediastinal and hilar lymphadenopathy was seen. This included the subcarinal region as well as both hilar areas in addition to AP window lymph node and a right lower paratracheal.  The patient proceeded to undergo a video flexible fiberoptic bronchoscopy with endobronchial biopsy with bronchial lavage. The airways were normal to the side segmental level bilaterally except for the right lower lobe below the takeoff of the superior segment which demonstrated a fungating mass consistent with bronchogenic carcinoma. 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  The patient is seen in multidisciplinary thoracic clinic today. A PET scan is pending for 10/07/2013. The patient also requires an MRI scan of the brain which will be ordered.    PREVIOUS RADIATION  THERAPY: No   PAST MEDICAL HISTORY:  has a past medical history of H/O: stroke (2012, 2000, ); Hypertension; Anxiety; PTSD (post-traumatic stress disorder); Depression; SVD (spontaneous vaginal delivery); Smoker; Osteoarthritis; Shortness of breath; Pain; Hot flashes, menopausal; Family history of anesthesia complication; Complication of anesthesia (03/02/13); COPD (chronic obstructive pulmonary disease); and Stroke (2012).     PAST SURGICAL HISTORY: Past Surgical History  Procedure Laterality Date  . Carpal tunnel release      BILATERAL  . Nasal sinus surgery    . Lumbar fusion      L4-S1  . Cervical fusion    . Hernia repair    . Salpingoophorectomy Right   . Vaginal hysterectomy      TAH w/ ovary removal  . Transthoracic echocardiogram  12-03-2011    LVSF NORMAL/ EF 60-65%  . Vulvectomy N/A 03/02/2013    Procedure: WIDE LOCAL EXCISION VULVAR;  Surgeon: Rejeana Brock A. Duard Brady, MD;  Location: [facility];  Service: Gynecology;  Laterality: N/A;  . Co2 laser application N/A 05/13/2013    Procedure: LASER APPLICATION OF THE VULVA;  Surgeon: Laurette Schimke, MD;  Location: [facility];  Service: Gynecology;  Laterality: N/A;  . Video bronchoscopy Bilateral 09/23/2013    Procedure: VIDEO BRONCHOSCOPY WITHOUT FLUORO;  Surgeon: Nyoka Cowden, MD;  Location: [facility];  Service: Cardiopulmonary;  Laterality: Bilateral;     FAMILY HISTORY: family history includes Cancer in her maternal grandfather and mother; Diabetes in her maternal aunt and maternal grandfather; Heart disease in her father and maternal grandfather; Hypertension in her maternal grandfather.   SOCIAL HISTORY:  reports  that she quit smoking about 3 weeks ago. Her smoking use included Cigarettes. She has a 17.5 pack-year smoking history. She has never used smokeless tobacco. She reports that she does not drink alcohol or use illicit drugs.   ALLERGIES: Prednisone; Nsaids; Aspirin; and Dulera   MEDICATIONS:  Current  Outpatient Prescriptions  Medication Sig Dispense Refill  . Aclidinium Bromide (TUDORZA PRESSAIR) 400 MCG/ACT AEPB Inhale 1 puff into the lungs 2 (two) times daily.      [name] albuterol (PROVENTIL) (2.5 MG/3ML) 0.083% nebulizer solution Take 2.5 mg by nebulization as needed for wheezing.      [name] albuterol (PROVENTIL) (5 MG/ML) 0.5% nebulizer solution Take 2.5 mg by nebulization every 6 (six) hours as needed for wheezing.      [name] ALPRAZolam (XANAX) 1 MG tablet Take 1 mg by mouth 4 (four) times daily.       [name] amoxicillin-clavulanate (AUGMENTIN) 875-125 MG per tablet Take 1 tablet by mouth 2 (two) times daily.  20 tablet  0  . atenolol (TENORMIN) 25 MG tablet Take 25 mg by mouth daily.      . benzonatate (TESSALON) 200 MG capsule Take 1 capsule (200 mg total) by mouth 3 (three) times daily as needed for cough.  30 capsule  1  . chlorzoxazone (PARAFON) 500 MG tablet Take 500 mg by mouth 4 (four) times daily as needed for muscle spasms.      [name] dextromethorphan-guaiFENesin (MUCINEX DM) 30-600 MG per 12 hr tablet Take 1 tablet by mouth every 12 (twelve) hours.      . diphenhydrAMINE (BENADRYL) 25 mg capsule Take 25 mg by mouth 2 (two) times daily.       [name] HYDROcodone-acetaminophen (NORCO) 10-325 MG per tablet Take 1 tablet by mouth every 6 (six) hours as needed for pain.      . nicotine (NICODERM CQ - DOSED IN MG/24 HOURS) 14 mg/24hr patch Place 1 patch onto the skin daily.  28 patch  0  . sertraline (ZOLOFT) 100 MG tablet Take 200 mg by mouth daily.       . trazodone (DESYREL) 300 MG tablet Take 300 mg by mouth at bedtime.       No current facility-administered medications for this encounter.     REVIEW OF SYSTEMS:  A 15 point review of systems is documented in the electronic medical record. This was obtained by the nursing staff. However, I reviewed this with the patient to discuss relevant findings and make appropriate changes.  Pertinent items are noted in HPI.    PHYSICAL EXAM:  vitals were not taken  for this visit.  General: Well-developed, in no acute distress HEENT: Normocephalic, atraumatic; oral cavity clear Neck: Supple without any lymphadenopathy Cardiovascular: Regular rate and rhythm Respiratory: Decreased breath sounds in the right base otherwise clear to auscultation bilaterally GI: Soft, nontender, normal bowel sounds Extremities: No edema present Neuro: No focal deficits    LABORATORY DATA:  Lab Results  Component Value Date   WBC 7.6 09/05/2013   HGB 11.2* 09/05/2013   HCT 34.5* 09/05/2013   MCV 94.3 09/05/2013   PLT 242 09/05/2013   Lab Results  Component Value Date   NA 137 09/06/2013   K 3.9 09/06/2013   CL 101 09/06/2013   CO2 28 09/06/2013   Lab Results  Component Value Date   ALT 6 08/06/2011   AST 13 08/06/2011   ALKPHOS 79 08/06/2011   BILITOT 0.4 08/06/2011      RADIOGRAPHY: No results found.  IMPRESSION:  the patient is presenting with what appears to represent a stage IIIB non-small cell lung cancer consistent with squamous cell carcinoma: [id number]. Additional staging has been ordered including a PET scan and a brain MRI scan. The PET scan has been scheduled for 10/07/2013.  I discussed with the patient a course of chemoradiotherapy which was the recommendation from for thoracic conference this morning. We therefore discussed a potential 6-1/2 week course of treatment. We discussed the rationale of this treatment in addition to the possible side effects and risks. All of her questions were answered. The patient is being seen by Dr. Shirline Frees in medical oncology today as well to explore systemic treatment options.  All of the patient's questions were answered. She does wish to proceed with this treatment plan.    PLAN: the patient will proceed with a simulation next week such that we can begin treatment planning. I anticipate beginning the patient's treatment, given the timing of staging studies, on 10/18/2013.     I spent 60 minutes face to face with the patient  and more than 50% of that time was spent in counseling and/or coordination of care.    ________________________________   Radene Gunning, MD, PhD

## 2013-10-06 ENCOUNTER — Ambulatory Visit
Admission: RE | Admit: 2013-10-06 | Discharge: 2013-10-06 | Disposition: A | Discharge status: Home / Self Care | Payer: Medicare Other | Source: Ambulatory Visit | Attending: Radiation Oncology | Admitting: Radiation Oncology

## 2013-10-06 ENCOUNTER — Telehealth: Payer: Self-pay | Admitting: Internal Medicine

## 2013-10-06 DIAGNOSIS — C3490 Malignant neoplasm of unspecified part of unspecified bronchus or lung: Secondary | ICD-10-CM | POA: Insufficient documentation

## 2013-10-06 DIAGNOSIS — R5381 Other malaise: Secondary | ICD-10-CM | POA: Insufficient documentation

## 2013-10-06 DIAGNOSIS — Z510 Encounter for antineoplastic radiation therapy: Secondary | ICD-10-CM | POA: Insufficient documentation

## 2013-10-06 DIAGNOSIS — Z79899 Other long term (current) drug therapy: Secondary | ICD-10-CM | POA: Insufficient documentation

## 2013-10-06 DIAGNOSIS — R197 Diarrhea, unspecified: Secondary | ICD-10-CM | POA: Insufficient documentation

## 2013-10-06 DIAGNOSIS — R42 Dizziness and giddiness: Secondary | ICD-10-CM | POA: Insufficient documentation

## 2013-10-06 DIAGNOSIS — K209 Esophagitis, unspecified without bleeding: Secondary | ICD-10-CM | POA: Insufficient documentation

## 2013-10-06 DIAGNOSIS — F17200 Nicotine dependence, unspecified, uncomplicated: Secondary | ICD-10-CM | POA: Insufficient documentation

## 2013-10-06 DIAGNOSIS — F411 Generalized anxiety disorder: Secondary | ICD-10-CM | POA: Insufficient documentation

## 2013-10-06 MED ORDER — SODIUM CHLORIDE 0.9 % IJ SOLN
10.0000 mL | Freq: Once | INTRAMUSCULAR | Status: AC
  Administered 2013-10-06: 10 mL via INTRAVENOUS

## 2013-10-06 MED ORDER — HYDROCODONE-ACETAMINOPHEN 10-325 MG PO TABS: 1.0000 | ORAL_TABLET | Freq: Four times a day (QID) | ORAL | Status: DC | PRN

## 2013-10-06 NOTE — Progress Notes (Signed)
Patient brought back to nursing after ct simulation, d/c IV rac, placed 2x2 gause, held pressure 2 minutes, when getting patient ready to put jacket on, noticed gauses saturated with blood, patient  Was lthen placed 4x4 gause folded over RAC, paper taped, and had patient bend arm up 3 minutes, pressure held, no further bleeding noted, dressing dry, gave instructions to keep dressing on till this afternoon then apply bandaid over site, patient requesting pain medication norco to be written by Dr.moody today, and that Dr.Zack Hall in [place] be  Sent "everything that is being done at Radiation Oncology including what was done today", per patient request, his address is: [address] [place], [place]. [phone]

## 2013-10-06 NOTE — Progress Notes (Signed)
Patient arrived via w/c, gave name and dob as identification, not allergic to IV dye, or a diabetic, labs 09/29/13: BUN=12.4,CR=0.7, started Iv #22g 1 inch catheter to RAC x 1,excelleent blood return,flushed with 10ml Normal Saline, patietnt tolerated well, refused to jave any IV stick other than her RAC, uinformed therapist [place] 9:55 AM

## 2013-10-06 NOTE — Telephone Encounter (Signed)
C/D 10/06/13 for appt. 10/07/13

## 2013-10-07 ENCOUNTER — Ambulatory Visit: Payer: Medicare Other | Admitting: Physical Therapy

## 2013-10-07 ENCOUNTER — Encounter (HOSPITAL_COMMUNITY)
Admission: RE | Admit: 2013-10-07 | Discharge: 2013-10-07 | Disposition: A | Discharge status: Home / Self Care | Payer: Medicare Other | Source: Ambulatory Visit | Attending: Internal Medicine | Admitting: Internal Medicine

## 2013-10-07 ENCOUNTER — Encounter: Payer: Self-pay | Admitting: *Deleted

## 2013-10-07 ENCOUNTER — Ambulatory Visit
Admission: RE | Admit: 2013-10-07 | Discharge: 2013-10-07 | Disposition: A | Discharge status: Home / Self Care | Payer: Medicare Other | Source: Ambulatory Visit | Attending: Internal Medicine | Admitting: Internal Medicine

## 2013-10-07 ENCOUNTER — Encounter (HOSPITAL_COMMUNITY): Payer: Self-pay

## 2013-10-07 ENCOUNTER — Ambulatory Visit (HOSPITAL_BASED_OUTPATIENT_CLINIC_OR_DEPARTMENT_OTHER): Payer: Medicare Other | Admitting: Internal Medicine

## 2013-10-07 ENCOUNTER — Encounter: Payer: Self-pay | Admitting: Internal Medicine

## 2013-10-07 ENCOUNTER — Ambulatory Visit: Payer: Medicare Other | Admitting: Internal Medicine

## 2013-10-07 VITALS — BP 156/63 | HR 70 | Temp 98.8°F | Resp 18 | Ht 66.0 in | Wt 176.3 lb

## 2013-10-07 DIAGNOSIS — I2510 Atherosclerotic heart disease of native coronary artery without angina pectoris: Secondary | ICD-10-CM | POA: Insufficient documentation

## 2013-10-07 DIAGNOSIS — R918 Other nonspecific abnormal finding of lung field: Secondary | ICD-10-CM

## 2013-10-07 DIAGNOSIS — J189 Pneumonia, unspecified organism: Secondary | ICD-10-CM | POA: Insufficient documentation

## 2013-10-07 DIAGNOSIS — C3430 Malignant neoplasm of lower lobe, unspecified bronchus or lung: Secondary | ICD-10-CM

## 2013-10-07 DIAGNOSIS — F17200 Nicotine dependence, unspecified, uncomplicated: Secondary | ICD-10-CM

## 2013-10-07 DIAGNOSIS — C3491 Malignant neoplasm of unspecified part of right bronchus or lung: Secondary | ICD-10-CM

## 2013-10-07 DIAGNOSIS — C3490 Malignant neoplasm of unspecified part of unspecified bronchus or lung: Secondary | ICD-10-CM | POA: Insufficient documentation

## 2013-10-07 HISTORY — DX: Malignant neoplasm of unspecified part of unspecified bronchus or lung: C34.90

## 2013-10-07 LAB — GLUCOSE, CAPILLARY: Glucose-Capillary: 104 mg/dL — ABNORMAL HIGH (ref 70–99)

## 2013-10-07 MED ORDER — FLUDEOXYGLUCOSE F - 18 (FDG) INJECTION
18.3000 | Freq: Once | INTRAVENOUS | Status: AC | PRN
  Administered 2013-10-07: 18.3 via INTRAVENOUS

## 2013-10-07 MED ORDER — GADOBENATE DIMEGLUMINE 529 MG/ML IV SOLN
16.0000 mL | Freq: Once | INTRAVENOUS | Status: AC | PRN
  Administered 2013-10-07: 16 mL via INTRAVENOUS

## 2013-10-07 MED ORDER — PROCHLORPERAZINE MALEATE 10 MG PO TABS: 10.0000 mg | ORAL_TABLET | Freq: Four times a day (QID) | ORAL | Status: DC | PRN

## 2013-10-07 NOTE — Progress Notes (Signed)
Clinical Social Work met with patient at [facility] to follow up and provide support.  Patient's son expressed concerns with transportation and requesting assistance such as gas cards.  [name] has already utilized [employer], CSW provided information for Cancer Care assistance.  The patients feeling relieved to begin treatment.  She shared she has had difficulty sleeping due to pain/discomfort.  [name], MSW, LCSW Clinical Social Worker [facility] [phone]

## 2013-10-07 NOTE — Progress Notes (Signed)
[facility] [phone]   [phone]  OFFICE PROGRESS NOTE  [name], MD  [address]  [place] [address]  DIAGNOSIS: Locally advanced, likely stage IIIA (T2a., N2, M0) non-small cell lung cancer, squamous cell carcinoma diagnosed in September of 2014.  PRIOR THERAPY: None  CURRENT THERAPY: Concurrent chemoradiation with weekly carboplatin for AUC of 2 and paclitaxel 45 mg/M2 first dose expected on 10/18/2013.  CHEMOTHERAPY INTENT: Curative/control  CURRENT # OF CHEMOTHERAPY CYCLES: 1  CURRENT ANTIEMETICS: Zofran, Compazine and dexamethasone  CURRENT SMOKING STATUS: Current smoker and she was strongly advised to quit smoking and also to smoke cessation program  ORAL CHEMOTHERAPY AND CONSENT: None  CURRENT BISPHOSPHONATES USE: None  PAIN MANAGEMENT: 0/10  NARCOTICS INDUCED CONSTIPATION: None  LIVING WILL AND CODE STATUS: Full code   INTERVAL HISTORY: [name] 67 y.o. female returns to the clinic today for followup visit accompanied by her son. The patient is feeling fine today with no specific complaints except for fatigue. She has been here since early this morning for the PET scan as well as MRI of the brain. She denied having any significant chest pain but continues to have shortness breath with exertion and mild cough. The patient denied having any hemoptysis. She has no nausea or vomiting. She has no fever or chills. The patient had a PET scan as well as MRI of the brain performed earlier today and she is here for evaluation and discussion of her scans and treatment options.   MEDICAL HISTORY: Past Medical History  Diagnosis Date  . H/O: stroke 2012, 2000,     X3  . Hypertension   . Anxiety   . PTSD (post-traumatic stress disorder)   . Depression   . SVD (spontaneous vaginal delivery)     x 1  . Smoker   . Osteoarthritis     knees, hips hands,   . Shortness of breath   . Pain     SEVERE PAIN BACK, RIGHT  HIP AND KNEES--HARRINGTON RODS AND CERVICAL PLATES--USES WALKER OR CANE WHEN AMBULATING - GOES TO A PAIN CLINIC-STATES SHE NEEDS RT HIP AND BOTH KNEES REPLACED. PT STATES SHE WAS TOLD THE CEMENT AROUND THE HARRINGTON RODS IS CRACKED.  [name] Hot flashes, menopausal     SEVERE  . Family history of anesthesia complication     equipment failure caused problem  . Complication of anesthesia 03/02/13    severe headache,vertigo postop  . COPD (chronic obstructive pulmonary disease)     stable  . 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 Lung cancer     ALLERGIES:  is allergic to prednisone; nsaids; aspirin; and dulera.  MEDICATIONS:  Current Outpatient Prescriptions  Medication Sig Dispense Refill  . Aclidinium Bromide (TUDORZA PRESSAIR) 400 MCG/ACT AEPB Inhale 1 puff into the lungs 2 (two) times daily.      [name] albuterol (PROVENTIL) (2.5 MG/3ML) 0.083% nebulizer solution Take 2.5 mg by nebulization as needed for wheezing.      [name] albuterol (PROVENTIL) (5 MG/ML) 0.5% nebulizer solution Take 2.5 mg by nebulization every 6 (six) hours as needed for wheezing.      [name] ALPRAZolam (XANAX) 1 MG tablet Take 1 mg by mouth 4 (four) times daily.       [name] amoxicillin-clavulanate (AUGMENTIN) 875-125 MG per tablet Take 1 tablet by mouth 2 (two) times daily.  20 tablet  0  . atenolol (TENORMIN) 25 MG tablet Take 25 mg by  mouth daily.      . benzonatate (TESSALON) 200 MG capsule Take 1 capsule (200 mg total) by mouth 3 (three) times daily as needed for cough.  30 capsule  1  . chlorzoxazone (PARAFON) 500 MG tablet Take 500 mg by mouth 4 (four) times daily as needed for muscle spasms.      [name] dextromethorphan-guaiFENesin (MUCINEX DM) 30-600 MG per 12 hr tablet Take 1 tablet by mouth every 12 (twelve) hours.      . diphenhydrAMINE (BENADRYL) 25 mg capsule Take 25 mg by mouth 2 (two) times daily.       [name] HYDROcodone-acetaminophen (NORCO) 10-325 MG per tablet Take 1 tablet by mouth every 6 (six) hours as needed for pain.  60 tablet   0  . nicotine (NICODERM CQ - DOSED IN MG/24 HOURS) 14 mg/24hr patch Place 1 patch onto the skin daily.  28 patch  0  . sertraline (ZOLOFT) 100 MG tablet Take 200 mg by mouth daily.       . trazodone (DESYREL) 300 MG tablet Take 300 mg by mouth at bedtime.      . prochlorperazine (COMPAZINE) 10 MG tablet Take 1 tablet (10 mg total) by mouth every 6 (six) hours as needed.  60 tablet  0   No current facility-administered medications for this visit.    SURGICAL HISTORY:  Past Surgical History  Procedure Laterality Date  . Carpal tunnel release      BILATERAL  . Nasal sinus surgery    . Lumbar fusion      L4-S1  . Cervical fusion    . Hernia repair    . Salpingoophorectomy Right   . Vaginal hysterectomy      TAH w/ ovary removal  . Transthoracic echocardiogram  12-03-2011    LVSF NORMAL/ EF 60-65%  . Vulvectomy N/A 03/02/2013    Procedure: WIDE LOCAL EXCISION VULVAR;  Surgeon: Rejeana Brock A. Duard Brady, MD;  Location: [facility];  Service: Gynecology;  Laterality: N/A;  . Co2 laser application N/A 05/13/2013    Procedure: LASER APPLICATION OF THE VULVA;  Surgeon: Laurette Schimke, MD;  Location: [facility];  Service: Gynecology;  Laterality: N/A;  . Video bronchoscopy Bilateral 09/23/2013    Procedure: VIDEO BRONCHOSCOPY WITHOUT FLUORO;  Surgeon: Nyoka Cowden, MD;  Location: [facility];  Service: Cardiopulmonary;  Laterality: Bilateral;    REVIEW OF SYSTEMS:  Constitutional: positive for fatigue Eyes: negative Ears, nose, mouth, throat, and face: negative Respiratory: positive for dyspnea on exertion Cardiovascular: negative Gastrointestinal: negative Genitourinary:negative Integument/breast: negative Hematologic/lymphatic: negative Musculoskeletal:negative Neurological: negative Behavioral/Psych: negative Endocrine: negative Allergic/Immunologic: negative   PHYSICAL EXAMINATION: General appearance: alert, cooperative, fatigued and no distress Head: Normocephalic, without  obvious abnormality, atraumatic Neck: no adenopathy, no JVD, supple, symmetrical, trachea midline and thyroid not enlarged, symmetric, no tenderness/mass/nodules Lymph nodes: Cervical, supraclavicular, and axillary nodes normal. Resp: clear to auscultation bilaterally Back: symmetric, no curvature. ROM normal. No CVA tenderness. Cardio: regular rate and rhythm, S1, S2 normal, no murmur, click, rub or gallop GI: soft, non-tender; bowel sounds normal; no masses,  no organomegaly Extremities: extremities normal, atraumatic, no cyanosis or edema Neurologic: Alert and oriented X 3, normal strength and tone. Normal symmetric reflexes. Normal coordination and gait  ECOG PERFORMANCE STATUS: 1 - Symptomatic but completely ambulatory  Blood pressure 156/63, pulse 70, temperature 98.8 F (37.1 C), temperature source Oral, resp. rate 18, height 5\' 6"  (1.676 m), weight 176 lb 4.8 oz (79.969 kg), SpO2 95.00%.  LABORATORY DATA: Lab Results  Component Value Date   WBC 7.6 09/05/2013   HGB 11.2* 09/05/2013   HCT 34.5* 09/05/2013   MCV 94.3 09/05/2013   PLT 242 09/05/2013      Chemistry      Component Value Date/Time   NA 137 09/06/2013 0630   K 3.9 09/06/2013 0630   CL 101 09/06/2013 0630   CO2 28 09/06/2013 0630   BUN 6 09/06/2013 0630   CREATININE 0.63 09/06/2013 0630      Component Value Date/Time   CALCIUM 8.9 09/06/2013 0630   ALKPHOS 79 08/06/2011 0101   AST 13 08/06/2011 0101   ALT 6 08/06/2011 0101   BILITOT 0.4 08/06/2011 0101       RADIOGRAPHIC STUDIES: Mr Laqueta Jean Wo Contrast  10/12/13   CLINICAL DATA:  Right ear pain for 3 months. Lung cancer.  EXAM: MRI HEAD WITHOUT AND WITH CONTRAST  TECHNIQUE: Multiplanar, multiecho pulse sequences of the brain and surrounding structures were obtained according to standard protocol without and with intravenous contrast  CONTRAST:  16mL MULTIHANCE GADOBENATE DIMEGLUMINE 529 MG/ML IV SOLN  COMPARISON:  CT head 08/06/2011.  FINDINGS: The patient was unable to remain  motionless for the exam. Small or subtle lesions could be overlooked.  No evidence for acute infarction, hemorrhage, mass lesion, hydrocephalus, or extra-axial fluid. Mild atrophy. Mild chronic microvascular ischemic change. No lacunar or large vessel infarct. Tiny focus of chronic hemorrhage right inferior temporal lobe.  There is slight heterogeneity of the calvarium, skull base, and upper cervical region. Osseous metastatic disease is not excluded. There is slight nonspecific flattening of the C4 vertebral body not present on previous study from 2005.  Normal pituitary and cerebellar tonsils. Flow voids are maintained in the carotid, basilar, and vertebral arteries.  Post infusion, there is no abnormal enhancement of the brain or meninges. Note intra-axial metastatic disease is observed. The scalp and extracranial soft tissues appear unremarkable. There is no sinus or mastoid disease. Negative orbits.  Compared with prior CT, the atrophy may have mildly progressed.  IMPRESSION: No intracranial metastatic disease is observed.  Mild atrophy and mild chronic microvascular ischemic change.  Heterogeneity of the calvarium, skull base, and upper cervical region could represent early osseous metastatic disease.   Electronically Signed   By: Davonna Belling M.D.   On: 2013-10-12 13:42   CLINICAL DATA: Initial treatment strategy for lung cancer.  EXAM:  NUCLEAR MEDICINE PET SKULL BASE TO THIGH  FASTING BLOOD GLUCOSE: Value: 104 mg/dl  TECHNIQUE:  29.5 mCi A-21 FDG was injected intravenously. CT data was obtained  and used for attenuation correction and anatomic localization only.  (This was not acquired as a diagnostic CT examination.) Additional  exam technical data entered on technologist worksheet.  COMPARISON: 09/04/2013 ; 11/20/2012  FINDINGS:  NECK  Negative.  CHEST  4.6 x 4.1 cm right lower lobe mass extends to the my in a minor  fissure/ hilar region and has a maximum standard uptake value the of    18.1. There is associated postobstructive pneumonitis and occlusion  of the right lower lobe bronchus. Trace right pleural effusion.  Coronary atherosclerosis noted. 1 cm right lower paratracheal lymph  node does not have activity higher than background mediastinal  tissues ; similar AP window and small anterior mediastinal lymph  nodes do not appear hypermetabolic.  Trace pericardial effusion.  ABDOMEN/PELVIS  Aortoiliac atherosclerotic vascular disease. No findings of  metastatic disease to the abdomen/ pelvis.  SKELETON  No focal hypermetabolic activity to suggest skeletal  metastasis.  IMPRESSION:  1. Highly hypermetabolic right lower lobe mass compatible with  malignancy. Borderline enlarged mediastinal lymph nodes are not  hypermetabolic.  2. Atherosclerosis.  3. Trace pericardial effusion and trace right pleural effusion.  Electronically Signed  By: Herbie Baltimore M.D.  On: 10/07/2013 14:41    ASSESSMENT AND PLAN: This is a very pleasant 67 years old white female recently diagnosed with locally advanced non-small cell lung cancer most likely stage IIIA (T2a., and, M0) non-small cell lung cancer, squamous cell carcinoma.  I have a lengthy discussion with the patient and her son today about her current disease status and treatment options. The PET scan showed no evidence for metastatic disease and the brain MRI showed no evidence for brain metastasis. I discussed the scan results and showed the images to the patient and her son. I recommended for her to proceed with a course of concurrent chemoradiation with weekly carboplatin and paclitaxel as previously discussed. She will be treated with carboplatin for AUC of 2 and paclitaxel 45 mg/M2. I discussed with the patient adverse effect of the chemotherapy including but not limited to alopecia, myelosuppression, nausea and vomiting, peripheral neuropathy, liver or renal dysfunction. The patient is expected to start the first cycle of  this treatment on 10/18/2013. I will call her pharmacy with prescription for Compazine 10 mg by mouth every 6 hours as needed for nausea. She would have taking medication class next week before starting the first cycle of her treatment.  The patient was advised to call immediately if she has any concerning symptoms in the interval.  She would come back for followup visit in 3 weeks for reevaluation and management any adverse effect of her treatment.  The patient voices understanding of current disease status and treatment options and is in agreement with the current care plan.  All questions were answered. The patient knows to call the clinic with any problems, questions or concerns. We can certainly see the patient much sooner if necessary.  I spent 20 minutes counseling the patient face to face. The total time spent in the appointment was 30 minutes.

## 2013-10-08 ENCOUNTER — Telehealth: Payer: Self-pay | Admitting: *Deleted

## 2013-10-08 ENCOUNTER — Encounter: Payer: Self-pay | Admitting: Internal Medicine

## 2013-10-08 NOTE — Telephone Encounter (Signed)
i didn't mail the letter/avs due to i seen [name] note therefore i canceled the appts for Monday that i had scheduled. However i did leave the edu class and informed her son of the appt...td

## 2013-10-08 NOTE — Telephone Encounter (Signed)
sw pt son gv appt for the chemo edu on 10/14/13 @ 12:30p her @ [facility]...td

## 2013-10-08 NOTE — Telephone Encounter (Signed)
i will mail a letter/avs to inform the pt about her chemo edu class, and her appts on 10/25/13. The pt phone did not allow me to leave a message...td

## 2013-10-09 NOTE — Patient Instructions (Signed)
CURRENT THERAPY: Concurrent chemoradiation with weekly carboplatin for AUC of 2 and paclitaxel 45 mg/M2 first dose expected on 10/18/2013.  CHEMOTHERAPY INTENT: Curative/control  CURRENT # OF CHEMOTHERAPY CYCLES: 1  CURRENT ANTIEMETICS: Zofran, Compazine and dexamethasone  CURRENT SMOKING STATUS: Current smoker and she was strongly advised to quit smoking and also to smoke cessation program  ORAL CHEMOTHERAPY AND CONSENT: None  CURRENT BISPHOSPHONATES USE: None  PAIN MANAGEMENT: 0/10  NARCOTICS INDUCED CONSTIPATION: None  LIVING WILL AND CODE STATUS: Full code

## 2013-10-11 ENCOUNTER — Telehealth: Payer: Self-pay | Admitting: *Deleted

## 2013-10-11 NOTE — Progress Notes (Signed)
Quick Note:  Spoke with pt and notified of results per Dr. Wert. Pt verbalized understanding and denied any questions.  ______ 

## 2013-10-11 NOTE — Progress Notes (Signed)
  Radiation Oncology         [phone] ________________________________  Name: [name] MRN: [id number]  Date: 10/06/2013  DOB: 05-31-1946  RESPIRATORY MOTION MANAGEMENT SIMULATION  NARRATIVE:  In order to account for effect of respiratory motion on target structures and other organs in the planning and delivery of radiotherapy, this patient underwent respiratory motion management simulation.  To accomplish this, when the patient was brought to the CT simulation planning suite, 4D respiratoy motion management CT images were obtained.  The CT images were loaded into the planning software.  Then, using a variety of tools including Cine, MIP, and standard views, the target volume and planning target volumes (PTV) were delineated.  Avoidance structures were contoured.  Treatment planning then occurred.  Dose volume histograms were generated and reviewed for each of the requested structure.  The resulting plan was carefully reviewed and approved today.  ------------------------------------------------  Radene Gunning, MD, PhD

## 2013-10-11 NOTE — Progress Notes (Addendum)
  Radiation Oncology         [phone] ________________________________  Name: [name] MRN: [id number]  Date: 10/06/2013  DOB: 12/24/1946  SIMULATION AND TREATMENT PLANNING NOTE  DIAGNOSIS:  Lung cancer  NARRATIVE:  The patient was brought to the CT Simulation planning suite.  Identity was confirmed.  All relevant records and images related to the planned course of therapy were reviewed.   Written consent to proceed with treatment was confirmed which was freely given after reviewing the details related to the planned course of therapy had been reviewed with the patient.  Then, the patient was set-up in a stable reproducible supine position for radiation therapy.  The patient's arms were raised above using a wing board device. CT images were obtained.  An isocenter was placed within the chest in relation to the target volume. Surface markings were placed.    The CT images were loaded into the planning software.  Then the target and avoidance structures were contoured.  Treatment planning then occurred.  The radiation prescription was entered and confirmed.  A total of 4 complex treatment devices were fabricated which correspond to the designed customized radiation treatment fields. Each of these customized fields/ complex treatment devices will be used on a daily basis during the radiation course. I have requested a 3D Simulation.  I have requested a DVH of the following structures: target volume, spinal cord, lungs, esophagus.   PLAN:  The patient will initially receive 60 Gy in 30 fractions. It is anticipated that the patient will then received a 6 gray boost. The patient's final total dose will be 66 gray.    Special treatment procedure The patient will receive chemotherapy during the course of radiation treatment. The patient may experience increased or overlapping toxicity due to this combined-modality approach and the patient will be monitored for such problems. This may include  extra lab work as necessary. This therefore constitutes a special treatment procedure.   ________________________________   Radene Gunning, MD, PhD

## 2013-10-11 NOTE — Telephone Encounter (Signed)
Per staff message and POF I have scheduled appts.  JMW  

## 2013-10-12 ENCOUNTER — Telehealth: Payer: Self-pay | Admitting: Internal Medicine

## 2013-10-12 NOTE — Telephone Encounter (Signed)
S/w pt re next 2 appts for 10/16 and 10/20. Pt will get new schedule when she comes in.

## 2013-10-13 ENCOUNTER — Telehealth: Payer: Self-pay | Admitting: Internal Medicine

## 2013-10-13 NOTE — Telephone Encounter (Signed)
I spoke with [name] from [facility]. I was advised pt insurance Medicare does not cover this or any cough syrups. They will have to pay out of pocket for this. It does not need a PA bc regardless her insurance will not pay.   ATC PT phone rings several times and no answer and no VM. WCB

## 2013-10-14 ENCOUNTER — Encounter: Payer: Self-pay | Admitting: Internal Medicine

## 2013-10-14 ENCOUNTER — Telehealth: Payer: Self-pay | Admitting: Internal Medicine

## 2013-10-14 ENCOUNTER — Other Ambulatory Visit: Payer: Medicare Other

## 2013-10-14 ENCOUNTER — Encounter: Payer: Self-pay | Admitting: *Deleted

## 2013-10-14 NOTE — Progress Notes (Signed)
Put [facility] form on nurse's desk. °

## 2013-10-14 NOTE — Telephone Encounter (Signed)
Error.[name] ° °

## 2013-10-14 NOTE — Telephone Encounter (Signed)
I spoke with the pt and advised. Carron Curie, CMA

## 2013-10-14 NOTE — Telephone Encounter (Signed)
Calling in ref to previous msg can be reached at [phone].[name]

## 2013-10-15 ENCOUNTER — Encounter: Payer: Self-pay | Admitting: Internal Medicine

## 2013-10-15 NOTE — Progress Notes (Signed)
Mailed form to [employer], put copy in registration desk for patient.

## 2013-10-18 ENCOUNTER — Ambulatory Visit (HOSPITAL_BASED_OUTPATIENT_CLINIC_OR_DEPARTMENT_OTHER): Payer: Medicare Other

## 2013-10-18 ENCOUNTER — Ambulatory Visit
Admission: RE | Admit: 2013-10-18 | Discharge: 2013-10-18 | Disposition: A | Discharge status: Home / Self Care | Payer: Medicare Other | Source: Ambulatory Visit | Attending: Radiation Oncology | Admitting: Radiation Oncology

## 2013-10-18 ENCOUNTER — Telehealth: Payer: Self-pay | Admitting: *Deleted

## 2013-10-18 ENCOUNTER — Other Ambulatory Visit: Payer: Self-pay | Admitting: Internal Medicine

## 2013-10-18 ENCOUNTER — Ambulatory Visit: Payer: Medicare Other | Admitting: Radiation Oncology

## 2013-10-18 ENCOUNTER — Other Ambulatory Visit (HOSPITAL_BASED_OUTPATIENT_CLINIC_OR_DEPARTMENT_OTHER): Payer: Medicare Other

## 2013-10-18 DIAGNOSIS — C3430 Malignant neoplasm of lower lobe, unspecified bronchus or lung: Secondary | ICD-10-CM

## 2013-10-18 DIAGNOSIS — C3491 Malignant neoplasm of unspecified part of right bronchus or lung: Secondary | ICD-10-CM

## 2013-10-18 DIAGNOSIS — Z5111 Encounter for antineoplastic chemotherapy: Secondary | ICD-10-CM

## 2013-10-18 LAB — COMPREHENSIVE METABOLIC PANEL (CC13)
ALT: <6 U/L (ref 0–55)
Albumin: 2.9 g/dL — ABNORMAL LOW (ref 3.5–5.0)
Anion Gap: 11 mEq/L (ref 3–11)
CO2: 23 mEq/L (ref 22–29)
Calcium: 8.6 mg/dL (ref 8.4–10.4)
Chloride: 105 mEq/L (ref 98–109)
Creatinine: 0.7 mg/dL (ref 0.6–1.1)
Glucose: 103 mg/dl (ref 70–140)
Sodium: 139 mEq/L (ref 136–145)
Total Bilirubin: 0.24 mg/dL (ref 0.20–1.20)
Total Protein: 6.8 g/dL (ref 6.4–8.3)

## 2013-10-18 LAB — CBC WITH DIFFERENTIAL/PLATELET
Basophils Absolute: 0 10*3/uL (ref 0.0–0.1)
Eosinophils Absolute: 0.1 10*3/uL (ref 0.0–0.5)
HCT: 34.2 % — ABNORMAL LOW (ref 34.8–46.6)
HGB: 10.9 g/dL — ABNORMAL LOW (ref 11.6–15.9)
LYMPH%: 16.3 % (ref 14.0–49.7)
MCHC: 31.9 g/dL (ref 31.5–36.0)
MCV: 92.2 fL (ref 79.5–101.0)
MONO#: 0.6 10*3/uL (ref 0.1–0.9)
MONO%: 6.8 % (ref 0.0–14.0)
NEUT#: 6.4 10*3/uL (ref 1.5–6.5)
Platelets: 254 10*3/uL (ref 145–400)
RDW: 13.3 % (ref 11.2–14.5)
WBC: 8.5 10*3/uL (ref 3.9–10.3)
lymph#: 1.4 10*3/uL (ref 0.9–3.3)

## 2013-10-18 MED ORDER — HYDROCODONE-ACETAMINOPHEN 10-325 MG PO TABS: 1.0000 | ORAL_TABLET | ORAL | Status: DC | PRN

## 2013-10-18 MED ORDER — SODIUM CHLORIDE 0.9 % IV SOLN
Freq: Once | INTRAVENOUS | Status: AC
  Administered 2013-10-18: 14:00:00 via INTRAVENOUS

## 2013-10-18 MED ORDER — DIPHENHYDRAMINE HCL 50 MG/ML IJ SOLN
INTRAMUSCULAR | Status: AC
  Filled 2013-10-18: qty 1

## 2013-10-18 MED ORDER — PACLITAXEL CHEMO INJECTION 300 MG/50ML
45.0000 mg/m2 | Freq: Once | INTRAVENOUS | Status: AC
  Administered 2013-10-18: 84 mg via INTRAVENOUS
  Filled 2013-10-18: qty 14

## 2013-10-18 MED ORDER — FAMOTIDINE IN NACL 20-0.9 MG/50ML-% IV SOLN
20.0000 mg | Freq: Once | INTRAVENOUS | Status: AC
  Administered 2013-10-18: 20 mg via INTRAVENOUS

## 2013-10-18 MED ORDER — DIPHENHYDRAMINE HCL 50 MG/ML IJ SOLN
50.0000 mg | Freq: Once | INTRAMUSCULAR | Status: AC
  Administered 2013-10-18: 50 mg via INTRAVENOUS

## 2013-10-18 MED ORDER — DEXAMETHASONE SODIUM PHOSPHATE 20 MG/5ML IJ SOLN
INTRAMUSCULAR | Status: AC
  Filled 2013-10-18: qty 5

## 2013-10-18 MED ORDER — ONDANSETRON 16 MG/50ML IVPB (CHCC)
16.0000 mg | Freq: Once | INTRAVENOUS | Status: AC
  Administered 2013-10-18: 16 mg via INTRAVENOUS

## 2013-10-18 MED ORDER — SODIUM CHLORIDE 0.9 % IV SOLN
189.8000 mg | Freq: Once | INTRAVENOUS | Status: AC
  Administered 2013-10-18: 190 mg via INTRAVENOUS
  Filled 2013-10-18: qty 19

## 2013-10-18 MED ORDER — DIPHENOXYLATE-ATROPINE 2.5-0.025 MG PO TABS: 2.0000 | ORAL_TABLET | Freq: Four times a day (QID) | ORAL | Status: DC | PRN

## 2013-10-18 MED ORDER — DEXAMETHASONE SODIUM PHOSPHATE 20 MG/5ML IJ SOLN
20.0000 mg | Freq: Once | INTRAMUSCULAR | Status: AC
  Administered 2013-10-18: 20 mg via INTRAVENOUS

## 2013-10-18 MED ORDER — FAMOTIDINE IN NACL 20-0.9 MG/50ML-% IV SOLN
INTRAVENOUS | Status: AC
  Filled 2013-10-18: qty 50

## 2013-10-18 MED ORDER — ONDANSETRON 16 MG/50ML IVPB (CHCC)
INTRAVENOUS | Status: AC
  Filled 2013-10-18: qty 16

## 2013-10-18 NOTE — Telephone Encounter (Signed)
Patient called have her bring her ID to sign for the norco rx, called lomotil rx in to [facility] already, can pick that up today , patient will have treatment tonight after all at 615pm, she spke with Jenifer,chief therapist Will let Dr.Moody know 10:53 AM

## 2013-10-18 NOTE — Patient Instructions (Signed)
[facility] Discharge Instructions for Patients Receiving Chemotherapy  Today you received the following chemotherapy agents :  Taxol, Carboplatin.  To help prevent nausea and vomiting after your treatment, we encourage you to take your nausea medication as instructed by your physician.  Take Compazine 10 mg by mouth every 6 hrs as needed for nausea.  DO NOT Eat greasy nor spicy foods.  DO Drink lots of fluids as tolerated.  DO NOT Drive after taking Compazine as it can cause drowsiness.  Take pain meds as needed to help control your pain.  Watch for constipation when taking pain meds.  Take stool softener, drink lots of water, eat prunes, drink prune juice as tolerated.   If you develop nausea and vomiting that is not controlled by your nausea medication, call the clinic.   BELOW ARE SYMPTOMS THAT SHOULD BE REPORTED IMMEDIATELY:  *FEVER GREATER THAN 100.5 F  *CHILLS WITH OR WITHOUT FEVER  NAUSEA AND VOMITING THAT IS NOT CONTROLLED WITH YOUR NAUSEA MEDICATION  *UNUSUAL SHORTNESS OF BREATH  *UNUSUAL BRUISING OR BLEEDING  TENDERNESS IN MOUTH AND THROAT WITH OR WITHOUT PRESENCE OF ULCERS  *URINARY PROBLEMS  *BOWEL PROBLEMS  UNUSUAL RASH Items with * indicate a potential emergency and should be followed up as soon as possible.  Feel free to call the clinic you have any questions or concerns. The clinic phone number is [phone].

## 2013-10-18 NOTE — Telephone Encounter (Signed)
Called [employer], spoke with Occupational hygienist, lomotil 2.5-0.25mg  tab lett, can take 2 tabs 4x day as needed for loose stools/diarrhea, dispence 40 tabs, no refill, patient to sign for norco rx,called patient home 10:48 AM

## 2013-10-18 NOTE — Telephone Encounter (Signed)
patient  Having diarrhea,  Took 2 boxes of imodium, not helping, and toe nail fell off, cancelling today, will go for lab and infusion , wants refill on pain med, norco-10/325mg , and rx for diarrhea, lomotil can call in to [facility], , will let Dr.moody and linac #1 9:36 AM

## 2013-10-18 NOTE — Progress Notes (Signed)
Took rxs for norco and Imotil ad to patient upstairs i med onc, patient signed  and was given rx's, showed NCDL,patient aware rad tx tionight at 615pm, son at side agrees 3:02 PM

## 2013-10-19 ENCOUNTER — Ambulatory Visit: Payer: Medicare Other

## 2013-10-19 ENCOUNTER — Telehealth: Payer: Self-pay | Admitting: *Deleted

## 2013-10-19 ENCOUNTER — Ambulatory Visit
Admission: RE | Admit: 2013-10-19 | Discharge: 2013-10-19 | Disposition: A | Discharge status: Home / Self Care | Payer: Medicare Other | Source: Ambulatory Visit | Attending: Radiation Oncology | Admitting: Radiation Oncology

## 2013-10-19 NOTE — Telephone Encounter (Signed)
Message copied by Augusto Garbe on Tue Oct 19, 2013  4:46 PM ------      Message from: Michiel Sites      Created: Mon Oct 18, 2013  6:23 PM      Regarding: Chemo f/u call       First time Taxol, Carboplatin, Dr. Donnald Garre, TB, RN ------

## 2013-10-19 NOTE — Telephone Encounter (Signed)
Called patient throughout the day today.  No answer at home and no answering machine.  Called son Rikki Spearing to inquire.  He has seen and spoken with his mom.  "She is doing fine.  Eating and drinking lots of fluids since it's her first chemotherapy.  She wet the bed last night she was drinking so much water and fluids."  She lives alone.  Asked Tammy Sours to let her know we called and have her call if questions or symptoms to report.

## 2013-10-20 ENCOUNTER — Ambulatory Visit: Payer: Medicare Other

## 2013-10-20 ENCOUNTER — Ambulatory Visit
Admission: RE | Admit: 2013-10-20 | Discharge: 2013-10-20 | Disposition: A | Discharge status: Home / Self Care | Payer: Medicare Other | Source: Ambulatory Visit | Attending: Radiation Oncology | Admitting: Radiation Oncology

## 2013-10-21 ENCOUNTER — Ambulatory Visit: Payer: Medicare Other

## 2013-10-21 ENCOUNTER — Ambulatory Visit
Admission: RE | Admit: 2013-10-21 | Discharge: 2013-10-21 | Disposition: A | Discharge status: Home / Self Care | Payer: Medicare Other | Source: Ambulatory Visit | Attending: Radiation Oncology | Admitting: Radiation Oncology

## 2013-10-21 ENCOUNTER — Encounter: Payer: Self-pay | Admitting: Radiation Oncology

## 2013-10-21 MED ORDER — BIAFINE EX EMUL
CUTANEOUS | Status: DC | PRN
  Administered 2013-10-21: 15:00:00 via TOPICAL

## 2013-10-21 NOTE — Addendum Note (Signed)
Encounter addended by: Lowella Petties, RN on: 10/21/2013  3:17 PM<BR>     Documentation filed: Visit Diagnoses, Inpatient MAR, Orders

## 2013-10-21 NOTE — Progress Notes (Signed)
Weekly rad txs,  4 completed, declined weight, knees back and neck pain stted, patient education, radiation therapy and you book given, biafine cream,my business card, discussed fatigue,skin irritation, throat changes, diet, pain, pat c/o wetting the bed last night, started compazine for nausea, and had diarrheaq this morning, couldn't afford lomotil, will try imodium ad again, had death in family nerves are bad, 2:24 PM

## 2013-10-21 NOTE — Progress Notes (Signed)
Department of Radiation Oncology  Phone:  [phone] Fax:        [phone]  Weekly Treatment Note    Name: [name] Date: 10/21/2013 MRN: [id number] DOB: [birth year]   Current dose: 8 Gy  Current fraction: 4   MEDICATIONS: Current Outpatient Prescriptions  Medication Sig Dispense Refill  . Aclidinium Bromide (TUDORZA PRESSAIR) 400 MCG/ACT AEPB Inhale 1 puff into the lungs 2 (two) times daily.      [name] albuterol (PROVENTIL) (2.5 MG/3ML) 0.083% nebulizer solution Take 2.5 mg by nebulization as needed for wheezing.      [name] albuterol (PROVENTIL) (5 MG/ML) 0.5% nebulizer solution Take 2.5 mg by nebulization every 6 (six) hours as needed for wheezing.      [name] ALPRAZolam (XANAX) 1 MG tablet Take 1 mg by mouth 4 (four) times daily.       [name] atenolol (TENORMIN) 25 MG tablet Take 25 mg by mouth daily.      . benzonatate (TESSALON) 200 MG capsule Take 1 capsule (200 mg total) by mouth 3 (three) times daily as needed for cough.  30 capsule  1  . chlorzoxazone (PARAFON) 500 MG tablet Take 500 mg by mouth 4 (four) times daily as needed for muscle spasms.      [name] dextromethorphan-guaiFENesin (MUCINEX DM) 30-600 MG per 12 hr tablet Take 1 tablet by mouth every 12 (twelve) hours.      . diphenhydrAMINE (BENADRYL) 25 mg capsule Take 25 mg by mouth 2 (two) times daily.       [name] esomeprazole (NEXIUM) 40 MG capsule Take 40 mg by mouth daily before breakfast.      . HYDROcodone-acetaminophen (NORCO) 10-325 MG per tablet Take 1 tablet by mouth every 4 (four) hours as needed for pain.  90 tablet  0  . nicotine (NICODERM CQ - DOSED IN MG/24 HOURS) 14 mg/24hr patch Place 1 patch onto the skin daily.  28 patch  0  . prochlorperazine (COMPAZINE) 10 MG tablet Take 1 tablet (10 mg total) by mouth every 6 (six) hours as needed.  60 tablet  0  . sertraline (ZOLOFT) 100 MG tablet Take 200 mg by mouth daily.       . trazodone (DESYREL) 300 MG tablet Take 300 mg by mouth at bedtime.      .  diphenoxylate-atropine (LOMOTIL) 2.5-0.025 MG per tablet Take 2 tablets by mouth 4 (four) times daily as needed for diarrhea or loose stools.  40 tablet  0   No current facility-administered medications for this encounter.     ALLERGIES: Prednisone; Nsaids; Aspirin; and Dulera   LABORATORY DATA:  Lab Results  Component Value Date   WBC 8.5 10/18/2013   HGB 10.9* 10/18/2013   HCT 34.2* 10/18/2013   MCV 92.2 10/18/2013   PLT 254 10/18/2013   Lab Results  Component Value Date   NA 139 10/18/2013   K 4.3 10/18/2013   CL 101 09/06/2013   CO2 23 10/18/2013   Lab Results  Component Value Date   ALT <6 10/18/2013   AST 11 10/18/2013   ALKPHOS 128 10/18/2013   BILITOT 0.24 10/18/2013     NARRATIVE: [name] was seen today for weekly treatment management. The chart was checked and the patient's films were reviewed. The patient is doing satisfactorily this week overall. Some diarrhea and she is taking Imodium for this. She was given a prescription for Lomotil but she cannot afford this currently. Nursing instructions given. The patient noticed that she wet  her bed a couple of times earlier this week.   PHYSICAL EXAMINATION: oral temperature is 99 F (37.2 C). Her blood pressure is 130/65 and her pulse is 69. Her respiration is 20 and oxygen saturation is 97%.        ASSESSMENT: The patient is doing satisfactorily with treatment.  PLAN: We will continue with the patient's radiation treatment as planned.

## 2013-10-22 ENCOUNTER — Ambulatory Visit
Admission: RE | Admit: 2013-10-22 | Discharge: 2013-10-22 | Disposition: A | Discharge status: Home / Self Care | Payer: Medicare Other | Source: Ambulatory Visit | Attending: Radiation Oncology | Admitting: Radiation Oncology

## 2013-10-22 ENCOUNTER — Ambulatory Visit: Payer: Medicare Other

## 2013-10-25 ENCOUNTER — Telehealth: Payer: Self-pay | Admitting: *Deleted

## 2013-10-25 ENCOUNTER — Ambulatory Visit: Payer: Medicare Other

## 2013-10-25 ENCOUNTER — Telehealth: Payer: Self-pay | Admitting: Medical Oncology

## 2013-10-25 ENCOUNTER — Ambulatory Visit: Payer: Medicare Other | Admitting: Physician Assistant

## 2013-10-25 ENCOUNTER — Other Ambulatory Visit (HOSPITAL_BASED_OUTPATIENT_CLINIC_OR_DEPARTMENT_OTHER): Payer: Medicare Other | Admitting: Lab

## 2013-10-25 ENCOUNTER — Telehealth: Payer: Self-pay | Admitting: Internal Medicine

## 2013-10-25 ENCOUNTER — Other Ambulatory Visit: Payer: Medicare Other | Admitting: Lab

## 2013-10-25 DIAGNOSIS — C3491 Malignant neoplasm of unspecified part of right bronchus or lung: Secondary | ICD-10-CM

## 2013-10-25 DIAGNOSIS — C3430 Malignant neoplasm of lower lobe, unspecified bronchus or lung: Secondary | ICD-10-CM

## 2013-10-25 LAB — CBC WITH DIFFERENTIAL/PLATELET
BASO%: 0.6 % (ref 0.0–2.0)
EOS%: 2.4 % (ref 0.0–7.0)
Eosinophils Absolute: 0.2 10*3/uL (ref 0.0–0.5)
HCT: 31.4 % — ABNORMAL LOW (ref 34.8–46.6)
HGB: 10.1 g/dL — ABNORMAL LOW (ref 11.6–15.9)
MCH: 29.4 pg (ref 25.1–34.0)
MCHC: 32.2 g/dL (ref 31.5–36.0)
MONO#: 0.6 10*3/uL (ref 0.1–0.9)
NEUT#: 4.6 10*3/uL (ref 1.5–6.5)
NEUT%: 72.4 % (ref 38.4–76.8)
RDW: 13.2 % (ref 11.2–14.5)
WBC: 6.3 10*3/uL (ref 3.9–10.3)
lymph#: 1 10*3/uL (ref 0.9–3.3)

## 2013-10-25 MED ORDER — AMOXICILLIN-POT CLAVULANATE 875-125 MG PO TABS: 1.0000 | ORAL_TABLET | Freq: Two times a day (BID) | ORAL | Status: DC

## 2013-10-25 NOTE — Telephone Encounter (Signed)
Patient's son called regarding her appts for today. I have forwarded the message to both the desk RN and radiation scheduling.

## 2013-10-25 NOTE — Telephone Encounter (Signed)
Back from lunch,message to call Med/onc RN Diane, left vm on her phone#[phone], that I had called patient and she was coming in then 30 min later received call from son saying cancel todays rad tx., left vm on his phone  That cancellation was received and passed on to therapist ,  and MD in basket message 1:12 PM

## 2013-10-25 NOTE — Telephone Encounter (Signed)
Called patient back from message given to me by scheduler Otis Peak, patient stated she had bronchitis and was coughing up small amt blood and yellow mucous and wanted to cancel her rad tx today and wanted a rx fro an antibiotic, she stated her son wants her to come for tx,asked if Dr.Moody could call in rx?", Informed her that MD was off today and tomorrow but she should come in and try to keep her rad tx appt, and afterwards will let on call MD see her to determine if she needed an antibiotic, and if she indeed had bronchitis, but she needed to come and be consistent with her rad treatments., patient stated she would call her son back and come in,thanked me for the call and ecouragement 8:52 AM

## 2013-10-25 NOTE — Telephone Encounter (Signed)
Patient 's son called and stated that "she was there and then left." I have moved appt to tomorrow

## 2013-10-25 NOTE — Telephone Encounter (Signed)
Son concerned about pt not wanting to come in today . She is not feeling well. I called pt and she said she has " bronchitis". I encouraged her to keep her appt today and she said she would. Son aware.

## 2013-10-25 NOTE — Telephone Encounter (Signed)
Augmentin 875 mg take one pill twice daily  X 10 days - take at breakfast and supper with large glass of water.  It would help reduce the usual side effects (diarrhea and yeast infections) if you ate cultured yogurt at lunch.  

## 2013-10-25 NOTE — Telephone Encounter (Signed)
Patient son call back saying he did bring his mom here but was told that her appt was for 230pm she stated she wouldn't wait that long, and cancelled and left, but checking in Epic, it still showed her appt with radiation at 1120am, transferred call to stephanie in linac 1 to see if they cancelled her appt time and changed it", after speaking with [facility] one, they didn't chanage her 1120 time, still showed , but when patient was up at registration she was misinformed of her appt times, she had   med onc appt after rad tx at 215pm, son told therapist he caught his mother smoking this weekend and what was the point of her getting rad tx if she continues to smoke.He is interested in smoking cessation for his mom, I will notify [name] social worker to call him, hopefully patient will come tomorrow for treatment 2:09 PM  2:08 PM

## 2013-10-25 NOTE — Telephone Encounter (Signed)
I spoke with pt. She c/o productive cough w/ green phlem, slight fever 99.5-100.4 x yesterday. She feels very fatigue. She was suppose to see oncology today and ended up being there for 5 hrs a not being seen per pt and now is r/s for tomorrow. Pt reports she is just too exhausted to be able to come in for OV. She has been talking tylenol, sucking on pop cycles, taking mucinex and tessalon pearls. Please advise Dr. Sherene Sires thanks  Allergies  Allergen Reactions  . Prednisone     insomnia  . Nsaids     GI Upset  . Aspirin Other (See Comments)    Gi symptoms. Does NOT take ibuprofen or other NSAIDS  . Dulera [Mometasone Furo-Formoterol Fum] Palpitations    Panting " my body was twisted inside and out"

## 2013-10-25 NOTE — Telephone Encounter (Signed)
Pt aware of recs. rx sent 

## 2013-10-25 NOTE — Telephone Encounter (Signed)
patient son greg bray called [phone] and cancelled his mother's rad tx today after she agreed with me earlier to come and see Md, called him back and left vm [phone], that I got his message and will call tomorrow or hope patient can come for tx tomorrow,thanked him for calling, called linac 1 spoke with Ana, cancelling patient's tx today,pt rtefused to come after all 11:30 AM  11:29 AM

## 2013-10-26 ENCOUNTER — Ambulatory Visit
Admission: RE | Admit: 2013-10-26 | Discharge: 2013-10-26 | Disposition: A | Discharge status: Home / Self Care | Payer: Medicare Other | Source: Ambulatory Visit | Attending: Radiation Oncology | Admitting: Radiation Oncology

## 2013-10-26 ENCOUNTER — Ambulatory Visit (HOSPITAL_BASED_OUTPATIENT_CLINIC_OR_DEPARTMENT_OTHER): Payer: Medicare Other | Admitting: Internal Medicine

## 2013-10-26 ENCOUNTER — Other Ambulatory Visit (HOSPITAL_BASED_OUTPATIENT_CLINIC_OR_DEPARTMENT_OTHER): Payer: Medicare Other | Admitting: Lab

## 2013-10-26 ENCOUNTER — Ambulatory Visit: Payer: Medicare Other

## 2013-10-26 ENCOUNTER — Encounter: Payer: Self-pay | Admitting: Internal Medicine

## 2013-10-26 ENCOUNTER — Telehealth: Payer: Self-pay | Admitting: *Deleted

## 2013-10-26 ENCOUNTER — Ambulatory Visit (HOSPITAL_BASED_OUTPATIENT_CLINIC_OR_DEPARTMENT_OTHER): Payer: Medicare Other

## 2013-10-26 DIAGNOSIS — C3430 Malignant neoplasm of lower lobe, unspecified bronchus or lung: Secondary | ICD-10-CM

## 2013-10-26 DIAGNOSIS — Z5111 Encounter for antineoplastic chemotherapy: Secondary | ICD-10-CM

## 2013-10-26 DIAGNOSIS — F17200 Nicotine dependence, unspecified, uncomplicated: Secondary | ICD-10-CM

## 2013-10-26 DIAGNOSIS — C3491 Malignant neoplasm of unspecified part of right bronchus or lung: Secondary | ICD-10-CM

## 2013-10-26 LAB — COMPREHENSIVE METABOLIC PANEL (CC13)
ALT: <6 U/L (ref 0–55)
Albumin: 2.7 g/dL — ABNORMAL LOW (ref 3.5–5.0)
Alkaline Phosphatase: 114 U/L (ref 40–150)
BUN: 4 mg/dL — ABNORMAL LOW (ref 7.0–26.0)
Calcium: 8.5 mg/dL (ref 8.4–10.4)
Chloride: 100 mEq/L (ref 98–109)
Glucose: 109 mg/dl (ref 70–140)
Potassium: 3.6 mEq/L (ref 3.5–5.1)
Sodium: 135 mEq/L — ABNORMAL LOW (ref 136–145)
Total Protein: 6.4 g/dL (ref 6.4–8.3)

## 2013-10-26 MED ORDER — PACLITAXEL CHEMO INJECTION 300 MG/50ML
45.0000 mg/m2 | Freq: Once | INTRAVENOUS | Status: AC
  Administered 2013-10-26: 84 mg via INTRAVENOUS
  Filled 2013-10-26: qty 14

## 2013-10-26 MED ORDER — DIPHENHYDRAMINE HCL 50 MG/ML IJ SOLN
50.0000 mg | Freq: Once | INTRAMUSCULAR | Status: AC
  Administered 2013-10-26: 50 mg via INTRAVENOUS

## 2013-10-26 MED ORDER — ONDANSETRON 16 MG/50ML IVPB (CHCC)
16.0000 mg | Freq: Once | INTRAVENOUS | Status: AC
  Administered 2013-10-26: 16 mg via INTRAVENOUS

## 2013-10-26 MED ORDER — FAMOTIDINE IN NACL 20-0.9 MG/50ML-% IV SOLN
20.0000 mg | Freq: Once | INTRAVENOUS | Status: AC
  Administered 2013-10-26: 20 mg via INTRAVENOUS

## 2013-10-26 MED ORDER — SODIUM CHLORIDE 0.9 % IV SOLN
Freq: Once | INTRAVENOUS | Status: AC
  Administered 2013-10-26: 13:00:00 via INTRAVENOUS

## 2013-10-26 MED ORDER — DEXAMETHASONE SODIUM PHOSPHATE 20 MG/5ML IJ SOLN
20.0000 mg | Freq: Once | INTRAMUSCULAR | Status: AC
  Administered 2013-10-26: 20 mg via INTRAVENOUS

## 2013-10-26 MED ORDER — DEXAMETHASONE SODIUM PHOSPHATE 20 MG/5ML IJ SOLN
INTRAMUSCULAR | Status: AC
  Filled 2013-10-26: qty 5

## 2013-10-26 MED ORDER — DIPHENHYDRAMINE HCL 50 MG/ML IJ SOLN
INTRAMUSCULAR | Status: AC
  Filled 2013-10-26: qty 1

## 2013-10-26 MED ORDER — FAMOTIDINE IN NACL 20-0.9 MG/50ML-% IV SOLN
INTRAVENOUS | Status: AC
  Filled 2013-10-26: qty 50

## 2013-10-26 MED ORDER — ONDANSETRON 16 MG/50ML IVPB (CHCC)
INTRAVENOUS | Status: AC
  Filled 2013-10-26: qty 16

## 2013-10-26 MED ORDER — SODIUM CHLORIDE 0.9 % IV SOLN
189.8000 mg | Freq: Once | INTRAVENOUS | Status: AC
  Administered 2013-10-26: 190 mg via INTRAVENOUS
  Filled 2013-10-26: qty 19

## 2013-10-26 NOTE — Progress Notes (Signed)
[name] [phone]   [phone]  OFFICE PROGRESS NOTE  Gabriela Pizza, [name]  [address]  [place] [address]  DIAGNOSIS: Locally advanced, likely stage IIIA (T2a., N2, M0) non-small cell lung cancer, squamous cell carcinoma diagnosed in September of 2014.   PRIOR THERAPY: None   CURRENT THERAPY: Concurrent chemoradiation with weekly carboplatin for AUC of 2 and paclitaxel 45 mg/M2, status post 1 cycle. First dose was given on 10/18/2013.   CHEMOTHERAPY INTENT: Curative/control  CURRENT # OF CHEMOTHERAPY CYCLES: 1  CURRENT ANTIEMETICS: Zofran, Compazine and dexamethasone  CURRENT SMOKING STATUS: Current smoker and she was strongly advised to quit smoking and also to smoke cessation program  ORAL CHEMOTHERAPY AND CONSENT: None  CURRENT BISPHOSPHONATES USE: None  PAIN MANAGEMENT: 0/10  NARCOTICS INDUCED CONSTIPATION: None  LIVING WILL AND CODE STATUS: Full code  INTERVAL HISTORY: [name] 67 y.o. female returns to the clinic today for follow up visit accompanied by her son. The patient related the first cycle of her treatment with concurrent chemoradiation fairly well except for mild sore throat. She denied having any significant chest pain but continues to have shortness breath with exertion with some wheezes and mild cough with no hemoptysis. She denied having any significant weight loss or night sweats. The patient unfortunately continues to smoke few cigarettes every day but she is trying to quit.  MEDICAL HISTORY: Past Medical History  Diagnosis Date  . H/O: stroke 2012, 2000,     X3  . Hypertension   . Anxiety   . PTSD (post-traumatic stress disorder)   . Depression   . SVD (spontaneous vaginal delivery)     x 1  . Smoker   . Osteoarthritis     knees, hips hands,   . Shortness of breath   . Pain     SEVERE PAIN BACK, RIGHT HIP AND KNEES--HARRINGTON RODS AND CERVICAL PLATES--USES WALKER OR CANE WHEN AMBULATING - GOES  TO A PAIN CLINIC-STATES SHE NEEDS RT HIP AND BOTH KNEES REPLACED. PT STATES SHE WAS TOLD THE CEMENT AROUND THE HARRINGTON RODS IS CRACKED.  [name] Hot flashes, menopausal     SEVERE  . Family history of anesthesia complication     equipment failure caused problem  . Complication of anesthesia 03/02/13    severe headache,vertigo postop  . COPD (chronic obstructive pulmonary disease)     stable  . 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 Lung cancer     ALLERGIES:  is allergic to prednisone; nsaids; aspirin; and dulera.  MEDICATIONS:  Current Outpatient Prescriptions  Medication Sig Dispense Refill  . Aclidinium Bromide (TUDORZA PRESSAIR) 400 MCG/ACT AEPB Inhale 1 puff into the lungs 2 (two) times daily.      [name] albuterol (PROVENTIL) (2.5 MG/3ML) 0.083% nebulizer solution Take 2.5 mg by nebulization as needed for wheezing.      [name] albuterol (PROVENTIL) (5 MG/ML) 0.5% nebulizer solution Take 2.5 mg by nebulization every 6 (six) hours as needed for wheezing.      [name] ALPRAZolam (XANAX) 1 MG tablet Take 1 mg by mouth 4 (four) times daily.       [name] atenolol (TENORMIN) 25 MG tablet Take 25 mg by mouth daily.      . benzonatate (TESSALON) 200 MG capsule Take 1 capsule (200 mg total) by mouth 3 (three) times daily as needed for cough.  30 capsule  1  . chlorzoxazone (PARAFON) 500 MG tablet Take 500 mg  by mouth 4 (four) times daily as needed for muscle spasms.      [name] dextromethorphan-guaiFENesin (MUCINEX DM) 30-600 MG per 12 hr tablet Take 1 tablet by mouth every 12 (twelve) hours.      . diphenhydrAMINE (BENADRYL) 25 mg capsule Take 25 mg by mouth 2 (two) times daily.       [name] esomeprazole (NEXIUM) 40 MG capsule Take 40 mg by mouth daily before breakfast.      . HYDROcodone-acetaminophen (NORCO) 10-325 MG per tablet Take 1 tablet by mouth every 4 (four) hours as needed for pain.  90 tablet  0  . sertraline (ZOLOFT) 100 MG tablet Take 200 mg by mouth daily.       . trazodone (DESYREL) 300 MG tablet Take 300 mg by  mouth at bedtime.      [name] amoxicillin-clavulanate (AUGMENTIN) 875-125 MG per tablet Take 1 tablet by mouth 2 (two) times daily.  20 tablet  0  . diphenoxylate-atropine (LOMOTIL) 2.5-0.025 MG per tablet Take 2 tablets by mouth 4 (four) times daily as needed for diarrhea or loose stools.  40 tablet  0  . nicotine (NICODERM CQ - DOSED IN MG/24 HOURS) 14 mg/24hr patch Place 1 patch onto the skin daily.  28 patch  0  . prochlorperazine (COMPAZINE) 10 MG tablet Take 1 tablet (10 mg total) by mouth every 6 (six) hours as needed.  60 tablet  0   No current facility-administered medications for this visit.    SURGICAL HISTORY:  Past Surgical History  Procedure Laterality Date  . Carpal tunnel release      BILATERAL  . Nasal sinus surgery    . Lumbar fusion      L4-S1  . Cervical fusion    . Hernia repair    . Salpingoophorectomy Right   . Vaginal hysterectomy      TAH w/ ovary removal  . Transthoracic echocardiogram  12-03-2011    LVSF NORMAL/ EF 60-65%  . Vulvectomy N/A 03/02/2013    Procedure: WIDE LOCAL EXCISION VULVAR;  Surgeon: Rejeana Brock A. Duard Brady, [name];  Location: [facility];  Service: Gynecology;  Laterality: N/A;  . Co2 laser application N/A 05/13/2013    Procedure: LASER APPLICATION OF THE VULVA;  Surgeon: Laurette Schimke, [name];  Location[facility];  Service: Gynecology;  Laterality: N/A;  . Video bronchoscopy Bilateral 09/23/2013    Procedure: VIDEO BRONCHOSCOPY WITHOUT FLUORO;  Surgeon: Nyoka Cowden, [name];  Location: [facility];  Service: Cardiopulmonary;  Laterality: Bilateral;    REVIEW OF SYSTEMS:  A comprehensive review of systems was negative except for: Constitutional: positive for fatigue Respiratory: positive for dyspnea on exertion   PHYSICAL EXAMINATION: General appearance: alert, cooperative, fatigued and no distress Head: Normocephalic, without obvious abnormality, atraumatic Neck: no adenopathy, no JVD, supple, symmetrical, trachea midline and thyroid not  enlarged, symmetric, no tenderness/mass/nodules Lymph nodes: Cervical, supraclavicular, and axillary nodes normal. Resp: wheezes bilaterally Back: symmetric, no curvature. ROM normal. No CVA tenderness. Cardio: regular rate and rhythm, S1, S2 normal, no murmur, click, rub or gallop GI: soft, non-tender; bowel sounds normal; no masses,  no organomegaly Extremities: extremities normal, atraumatic, no cyanosis or edema  ECOG PERFORMANCE STATUS: 1 - Symptomatic but completely ambulatory  Blood pressure 150/70, pulse 64, temperature 97.7 F (36.5 C), temperature source Oral, resp. rate 19, height 5\' 6"  (1.676 m), weight 175 lb 14.4 oz (79.788 kg).  LABORATORY DATA: Lab Results  Component Value Date   WBC 6.3 10/25/2013   HGB 10.1*  10/25/2013   HCT 31.4* 10/25/2013   MCV 91.3 10/25/2013   PLT 282 10/25/2013      Chemistry      Component Value Date/Time   NA 139 10/18/2013 1237   NA 137 09/06/2013 0630   K 4.3 10/18/2013 1237   K 3.9 09/06/2013 0630   CL 101 09/06/2013 0630   CO2 23 10/18/2013 1237   CO2 28 09/06/2013 0630   BUN 5.0* 10/18/2013 1237   BUN 6 09/06/2013 0630   CREATININE 0.7 10/18/2013 1237   CREATININE 0.63 09/06/2013 0630      Component Value Date/Time   CALCIUM 8.6 10/18/2013 1237   CALCIUM 8.9 09/06/2013 0630   ALKPHOS 128 10/18/2013 1237   ALKPHOS 79 08/06/2011 0101   AST 11 10/18/2013 1237   AST 13 08/06/2011 0101   ALT <6 10/18/2013 1237   ALT 6 08/06/2011 0101   BILITOT 0.24 10/18/2013 1237   BILITOT 0.4 08/06/2011 0101       RADIOGRAPHIC STUDIES: Mr Laqueta Jean Wo Contrast  11/05/13   CLINICAL DATA:  Right ear pain for 3 months. Lung cancer.  EXAM: MRI HEAD WITHOUT AND WITH CONTRAST  TECHNIQUE: Multiplanar, multiecho pulse sequences of the brain and surrounding structures were obtained according to standard protocol without and with intravenous contrast  CONTRAST:  16mL MULTIHANCE GADOBENATE DIMEGLUMINE 529 MG/ML IV SOLN  COMPARISON:  CT head 08/06/2011.  FINDINGS: The  patient was unable to remain motionless for the exam. Small or subtle lesions could be overlooked.  No evidence for acute infarction, hemorrhage, mass lesion, hydrocephalus, or extra-axial fluid. Mild atrophy. Mild chronic microvascular ischemic change. No lacunar or large vessel infarct. Tiny focus of chronic hemorrhage right inferior temporal lobe.  There is slight heterogeneity of the calvarium, skull base, and upper cervical region. Osseous metastatic disease is not excluded. There is slight nonspecific flattening of the C4 vertebral body not present on previous study from 2005.  Normal pituitary and cerebellar tonsils. Flow voids are maintained in the carotid, basilar, and vertebral arteries.  Post infusion, there is no abnormal enhancement of the brain or meninges. Note intra-axial metastatic disease is observed. The scalp and extracranial soft tissues appear unremarkable. There is no sinus or mastoid disease. Negative orbits.  Compared with prior CT, the atrophy may have mildly progressed.  IMPRESSION: No intracranial metastatic disease is observed.  Mild atrophy and mild chronic microvascular ischemic change.  Heterogeneity of the calvarium, skull base, and upper cervical region could represent early osseous metastatic disease.   Electronically Signed   By: Davonna Belling M.D.   On: 11/05/13 13:42   Nm Pet Image Initial (pi) Skull Base To Thigh  11/05/2013   CLINICAL DATA:  Initial treatment strategy for lung cancer.  EXAM: NUCLEAR MEDICINE PET SKULL BASE TO THIGH  FASTING BLOOD GLUCOSE:  Value:  104 mg/dl  TECHNIQUE: 40.9 mCi W-11 FDG was injected intravenously. CT data was obtained and used for attenuation correction and anatomic localization only. (This was not acquired as a diagnostic CT examination.) Additional exam technical data entered on technologist worksheet.  COMPARISON:  09/04/2013 ; 11/20/2012  FINDINGS: NECK  Negative.  CHEST  4.6 x 4.1 cm right lower lobe mass extends to the my in a minor  fissure/ hilar region and has a maximum standard uptake value the of 18.1. There is associated postobstructive pneumonitis and occlusion of the right lower lobe bronchus. Trace right pleural effusion.  Coronary atherosclerosis noted. 1 cm right lower paratracheal lymph node does not  have activity higher than background mediastinal tissues ; similar AP window and small anterior mediastinal lymph nodes do not appear hypermetabolic.  Trace pericardial effusion.  ABDOMEN/PELVIS  Aortoiliac atherosclerotic vascular disease. No findings of metastatic disease to the abdomen/ pelvis.  SKELETON  No focal hypermetabolic activity to suggest skeletal metastasis.  IMPRESSION: 1. Highly hypermetabolic right lower lobe mass compatible with malignancy. Borderline enlarged mediastinal lymph nodes are not hypermetabolic. 2. Atherosclerosis. 3. Trace pericardial effusion and trace right pleural effusion.   Electronically Signed   By: Herbie Baltimore M.D.   On: 10/07/2013 14:41    ASSESSMENT AND PLAN: this is a very pleasant 67 years old white female with stage IIIa non-small cell lung cancer currently undergoing concurrent chemoradiation with weekly carboplatin and paclitaxel and tolerating her treatment fairly well. I recommended for the patient to continue her current treatment as scheduled and she will start cycle #2 today. The patient and her son are interested in moving her treatment to a [place] close to [place]. I explained to the patient that it may not be wise to transfer her care in the middle of the treatment especially with the radiation therapy but if it is okay with Dr. Mitzi Hansen I would be happy to arrange an appointment for her to continue her chemotherapy in [place] if she would be able to receive her radiotherapy in [place]. She would come back for follow up visit in 2 weeks or reevaluation and management any adverse effect of her treatment unless we transferred her care to [name]. She was advised  to call immediately if she has any concerning symptoms in the interval.  The patient voices understanding of current disease status and treatment options and is in agreement with the current care plan.  All questions were answered. The patient knows to call the clinic with any problems, questions or concerns. We can certainly see the patient much sooner if necessary.

## 2013-10-26 NOTE — Telephone Encounter (Signed)
Per staff message I have adjusted appt for 11/10

## 2013-10-26 NOTE — Patient Instructions (Signed)
[facility] Discharge Instructions for Patients Receiving Chemotherapy  Today you received the following chemotherapy agents:  Taxol and Carboplatin  To help prevent nausea and vomiting after your treatment, we encourage you to take your nausea medication as ordered per MD.   If you develop nausea and vomiting that is not controlled by your nausea medication, call the clinic.   BELOW ARE SYMPTOMS THAT SHOULD BE REPORTED IMMEDIATELY:  *FEVER GREATER THAN 100.5 F  *CHILLS WITH OR WITHOUT FEVER  NAUSEA AND VOMITING THAT IS NOT CONTROLLED WITH YOUR NAUSEA MEDICATION  *UNUSUAL SHORTNESS OF BREATH  *UNUSUAL BRUISING OR BLEEDING  TENDERNESS IN MOUTH AND THROAT WITH OR WITHOUT PRESENCE OF ULCERS  *URINARY PROBLEMS  *BOWEL PROBLEMS  UNUSUAL RASH Items with * indicate a potential emergency and should be followed up as soon as possible.  Feel free to call the clinic you have any questions or concerns. The clinic phone number is [phone].    

## 2013-10-26 NOTE — Telephone Encounter (Signed)
appts made and printed. Pt is aware that tx will be added. i emailed MW to adjust tx for 11/10 and to add the tx's...td

## 2013-10-27 ENCOUNTER — Ambulatory Visit: Payer: Medicare Other

## 2013-10-27 ENCOUNTER — Ambulatory Visit
Admission: RE | Admit: 2013-10-27 | Discharge: 2013-10-27 | Disposition: A | Discharge status: Home / Self Care | Payer: Medicare Other | Source: Ambulatory Visit | Attending: Radiation Oncology | Admitting: Radiation Oncology

## 2013-10-27 NOTE — Patient Instructions (Signed)
Continue concurrent chemoradiation as scheduled. Followup visit in 2 weeks 

## 2013-10-28 ENCOUNTER — Ambulatory Visit: Payer: Medicare Other

## 2013-10-28 ENCOUNTER — Ambulatory Visit
Admission: RE | Admit: 2013-10-28 | Discharge: 2013-10-28 | Disposition: A | Discharge status: Home / Self Care | Payer: Medicare Other | Source: Ambulatory Visit | Attending: Radiation Oncology | Admitting: Radiation Oncology

## 2013-10-28 ENCOUNTER — Encounter: Payer: Self-pay | Admitting: Radiation Oncology

## 2013-10-28 ENCOUNTER — Telehealth: Payer: Self-pay | Admitting: *Deleted

## 2013-10-28 MED ORDER — ALPRAZOLAM 1 MG PO TABS: 2.0000 mg | ORAL_TABLET | Freq: Four times a day (QID) | ORAL | Status: DC

## 2013-10-28 NOTE — Telephone Encounter (Signed)
Patient had left message on phone stating she was smoking more  Because she got a call from her mother's nursing home,"she has alzheimer's/dementia and they want me to come sign the papers to make her a dnr and I can't, my nerves are shot," "can Dr.moody increase my xanax to 2mg   For just a month, I've been up since 2 q am craving cigarettes, I know I need help quitting smoking"", spoke with Dr.moody and he will see her after rad tx today, called patient about MD to se her after her rad tx today to discuss her concerns, and requests, then called linac #1 and spoke with Judeth Cornfield therapist, to send patient to nursing after tx  Today . ptient called again and stated she has gone through the smoking program but at that time there wasn't funds for the patches, and she heard there are funds nbow and would like help with that", I will contact [name] today and inform her, patient stated thankyou to MD, You and Lauren Will see if Leotis Shames can expedite patient request for funds for smoking patch 9:20 AM  9:18 AM

## 2013-10-28 NOTE — Progress Notes (Signed)
Weekly rad txs rt lung, room air sats =97%, No c/o pain, Lauren Mullis came in to give papers on help for nicotine patches,gas card, and offerred to set up free counseling, patient stated "I pray a lot', nerves are bad, recent death in the family last week and now her mother is declining now  progressive pneumonia now,  Patient is on antibiotic from Dr. Sherene Sires, augmentin,  Started yesterday, , her thigh and knee hurts and back, coughing up greenish/yellow  Declined to be weighed today 12:04 PM

## 2013-10-29 ENCOUNTER — Ambulatory Visit: Payer: Medicare Other | Admitting: Radiation Oncology

## 2013-10-29 ENCOUNTER — Ambulatory Visit
Admission: RE | Admit: 2013-10-29 | Discharge: 2013-10-29 | Disposition: A | Discharge status: Home / Self Care | Payer: Medicare Other | Source: Ambulatory Visit | Attending: Radiation Oncology | Admitting: Radiation Oncology

## 2013-10-29 ENCOUNTER — Telehealth: Payer: Self-pay | Admitting: *Deleted

## 2013-10-29 ENCOUNTER — Ambulatory Visit: Payer: Medicare Other

## 2013-10-29 NOTE — Progress Notes (Signed)
Department of Radiation Oncology  Phone:  [phone] Fax:        [phone]  Weekly Treatment Note    Name: [name] Date: 10/29/2013 MRN: [id number] DOB: 1946-10-19   Current dose: 16 Gy  Current fraction: 8   MEDICATIONS: Current Outpatient Prescriptions  Medication Sig Dispense Refill  . Aclidinium Bromide (TUDORZA PRESSAIR) 400 MCG/ACT AEPB Inhale 1 puff into the lungs 2 (two) times daily.      [name] albuterol (PROVENTIL) (2.5 MG/3ML) 0.083% nebulizer solution Take 2.5 mg by nebulization as needed for wheezing.      [name] albuterol (PROVENTIL) (5 MG/ML) 0.5% nebulizer solution Take 2.5 mg by nebulization every 6 (six) hours as needed for wheezing.      [name] ALPRAZolam (XANAX) 1 MG tablet Take 2 tablets (2 mg total) by mouth 4 (four) times daily.  90 tablet  0  . amoxicillin-clavulanate (AUGMENTIN) 875-125 MG per tablet Take 1 tablet by mouth 2 (two) times daily.  20 tablet  0  . atenolol (TENORMIN) 25 MG tablet Take 25 mg by mouth daily.      . benzonatate (TESSALON) 200 MG capsule Take 1 capsule (200 mg total) by mouth 3 (three) times daily as needed for cough.  30 capsule  1  . chlorzoxazone (PARAFON) 500 MG tablet Take 500 mg by mouth 4 (four) times daily as needed for muscle spasms.      [name] dextromethorphan-guaiFENesin (MUCINEX DM) 30-600 MG per 12 hr tablet Take 1 tablet by mouth every 12 (twelve) hours.      . diphenhydrAMINE (BENADRYL) 25 mg capsule Take 25 mg by mouth 2 (two) times daily.       . diphenoxylate-atropine (LOMOTIL) 2.5-0.025 MG per tablet Take 2 tablets by mouth 4 (four) times daily as needed for diarrhea or loose stools.  40 tablet  0  . esomeprazole (NEXIUM) 40 MG capsule Take 40 mg by mouth daily before breakfast.      . HYDROcodone-acetaminophen (NORCO) 10-325 MG per tablet Take 1 tablet by mouth every 4 (four) hours as needed for pain.  90 tablet  0  . nicotine (NICODERM CQ - DOSED IN MG/24 HOURS) 14 mg/24hr patch Place 1 patch onto the skin daily.   28 patch  0  . prochlorperazine (COMPAZINE) 10 MG tablet Take 1 tablet (10 mg total) by mouth every 6 (six) hours as needed.  60 tablet  0  . sertraline (ZOLOFT) 100 MG tablet Take 200 mg by mouth daily.       . trazodone (DESYREL) 300 MG tablet Take 300 mg by mouth at bedtime.       No current facility-administered medications for this encounter.     ALLERGIES: Prednisone; Nsaids; Aspirin; and Dulera   LABORATORY DATA:  Lab Results  Component Value Date   WBC 6.3 10/25/2013   HGB 10.1* 10/25/2013   HCT 31.4* 10/25/2013   MCV 91.3 10/25/2013   PLT 282 10/25/2013   Lab Results  Component Value Date   NA 135* 10/26/2013   K 3.6 10/26/2013   CL 101 09/06/2013   CO2 26 10/26/2013   Lab Results  Component Value Date   ALT <6 10/26/2013   AST 11 10/26/2013   ALKPHOS 114 10/26/2013   BILITOT 0.32 10/26/2013     NARRATIVE: [name] was seen today for weekly treatment management. The chart was checked and the patient's films were reviewed. The patient was seen today at her request. She is quite anxious. She is  on Xanax and has requested a higher dose. I do not believe that she will be able to continue her treatments in the current state which she states has been precipitated by her lung cancer/treatment as well as the recent death of a) due to a fire. She has started smoking again. Smoking cessation counseling was given and the patient has been provided to help in terms of obtaining nicotine patches. A cast cart has also been given. The patient also asked about treatment in [place] but given the specifics of her plan she would not be able to receive the same treatment today there.  PHYSICAL EXAMINATION: oral temperature is 98.4 F (36.9 C). Her blood pressure is 155/70 and her pulse is 98. Her respiration is 20 and oxygen saturation is 97%.        ASSESSMENT: The patient is doing satisfactorily with treatment.  PLAN: We will continue with the patient's radiation treatment as  planned. As above. The patient will use nicotine patches and has been counseled in smoking cessation. She will continue Augmentin medication.

## 2013-10-29 NOTE — Telephone Encounter (Signed)
Spoke with Leotis Shames, clinical social worker about patient.  She stated Rad Onc would like pt to receive information on smoking cessation.  I called pt today.  She stated she has been without a cigarette today. She has information form [employer] about smoking cessation.  I asked if I could give her information on classes to help with cessation.  She stated not at this time.

## 2013-11-01 ENCOUNTER — Ambulatory Visit: Payer: Medicare Other

## 2013-11-01 ENCOUNTER — Ambulatory Visit
Admission: RE | Admit: 2013-11-01 | Discharge: 2013-11-01 | Disposition: A | Discharge status: Home / Self Care | Payer: Medicare Other | Source: Ambulatory Visit | Attending: Radiation Oncology | Admitting: Radiation Oncology

## 2013-11-01 ENCOUNTER — Ambulatory Visit (HOSPITAL_BASED_OUTPATIENT_CLINIC_OR_DEPARTMENT_OTHER): Payer: Medicare Other

## 2013-11-01 ENCOUNTER — Other Ambulatory Visit (HOSPITAL_BASED_OUTPATIENT_CLINIC_OR_DEPARTMENT_OTHER): Payer: Medicare Other | Admitting: Lab

## 2013-11-01 DIAGNOSIS — C3430 Malignant neoplasm of lower lobe, unspecified bronchus or lung: Secondary | ICD-10-CM

## 2013-11-01 DIAGNOSIS — C3491 Malignant neoplasm of unspecified part of right bronchus or lung: Secondary | ICD-10-CM

## 2013-11-01 DIAGNOSIS — Z5111 Encounter for antineoplastic chemotherapy: Secondary | ICD-10-CM

## 2013-11-01 LAB — COMPREHENSIVE METABOLIC PANEL (CC13)
ALT: 7 U/L (ref 0–55)
AST: 13 U/L (ref 5–34)
Albumin: 3.1 g/dL — ABNORMAL LOW (ref 3.5–5.0)
Alkaline Phosphatase: 131 U/L (ref 40–150)
Anion Gap: 10 mEq/L (ref 3–11)
BUN: 7.3 mg/dL (ref 7.0–26.0)
CO2: 24 mEq/L (ref 22–29)
Calcium: 9.2 mg/dL (ref 8.4–10.4)
Chloride: 101 mEq/L (ref 98–109)
Creatinine: 0.7 mg/dL (ref 0.6–1.1)
Glucose: 71 mg/dl (ref 70–140)
Potassium: 4.7 mEq/L (ref 3.5–5.1)
Sodium: 134 mEq/L — ABNORMAL LOW (ref 136–145)
Total Bilirubin: 0.31 mg/dL (ref 0.20–1.20)
Total Protein: 7.4 g/dL (ref 6.4–8.3)

## 2013-11-01 LAB — CBC WITH DIFFERENTIAL/PLATELET
BASO%: 0.5 % (ref 0.0–2.0)
Basophils Absolute: 0 10*3/uL (ref 0.0–0.1)
EOS%: 1.4 % (ref 0.0–7.0)
Eosinophils Absolute: 0.1 10*3/uL (ref 0.0–0.5)
HCT: 35.5 % (ref 34.8–46.6)
HGB: 11.6 g/dL (ref 11.6–15.9)
LYMPH%: 18.1 % (ref 14.0–49.7)
MCH: 29.8 pg (ref 25.1–34.0)
MCHC: 32.7 g/dL (ref 31.5–36.0)
MCV: 91.3 fL (ref 79.5–101.0)
MONO#: 0.5 10*3/uL (ref 0.1–0.9)
MONO%: 8.3 % (ref 0.0–14.0)
NEUT#: 4.5 10*3/uL (ref 1.5–6.5)
NEUT%: 71.7 % (ref 38.4–76.8)
Platelets: 274 10*3/uL (ref 145–400)
RBC: 3.89 10*6/uL (ref 3.70–5.45)
RDW: 13.6 % (ref 11.2–14.5)
WBC: 6.3 10*3/uL (ref 3.9–10.3)
lymph#: 1.1 10*3/uL (ref 0.9–3.3)
nRBC: 0 % (ref 0–0)

## 2013-11-01 MED ORDER — FAMOTIDINE IN NACL 20-0.9 MG/50ML-% IV SOLN
20.0000 mg | Freq: Once | INTRAVENOUS | Status: AC
  Administered 2013-11-01: 20 mg via INTRAVENOUS

## 2013-11-01 MED ORDER — DEXAMETHASONE SODIUM PHOSPHATE 20 MG/5ML IJ SOLN
INTRAMUSCULAR | Status: AC
  Filled 2013-11-01: qty 5

## 2013-11-01 MED ORDER — SODIUM CHLORIDE 0.9 % IV SOLN
189.8000 mg | Freq: Once | INTRAVENOUS | Status: AC
  Administered 2013-11-01: 190 mg via INTRAVENOUS
  Filled 2013-11-01: qty 19

## 2013-11-01 MED ORDER — DIPHENHYDRAMINE HCL 50 MG/ML IJ SOLN
50.0000 mg | Freq: Once | INTRAMUSCULAR | Status: AC
  Administered 2013-11-01: 50 mg via INTRAVENOUS

## 2013-11-01 MED ORDER — DEXAMETHASONE SODIUM PHOSPHATE 20 MG/5ML IJ SOLN
20.0000 mg | Freq: Once | INTRAMUSCULAR | Status: AC
  Administered 2013-11-01: 20 mg via INTRAVENOUS

## 2013-11-01 MED ORDER — PACLITAXEL CHEMO INJECTION 300 MG/50ML
45.0000 mg/m2 | Freq: Once | INTRAVENOUS | Status: AC
  Administered 2013-11-01: 84 mg via INTRAVENOUS
  Filled 2013-11-01: qty 14

## 2013-11-01 MED ORDER — ONDANSETRON 16 MG/50ML IVPB (CHCC)
16.0000 mg | Freq: Once | INTRAVENOUS | Status: AC
  Administered 2013-11-01: 16 mg via INTRAVENOUS

## 2013-11-01 MED ORDER — SODIUM CHLORIDE 0.9 % IV SOLN
Freq: Once | INTRAVENOUS | Status: AC
  Administered 2013-11-01: 13:00:00 via INTRAVENOUS

## 2013-11-01 MED ORDER — FAMOTIDINE IN NACL 20-0.9 MG/50ML-% IV SOLN
INTRAVENOUS | Status: AC
  Filled 2013-11-01: qty 50

## 2013-11-01 MED ORDER — ONDANSETRON 16 MG/50ML IVPB (CHCC)
INTRAVENOUS | Status: AC
  Filled 2013-11-01: qty 16

## 2013-11-01 MED ORDER — DIPHENHYDRAMINE HCL 50 MG/ML IJ SOLN
INTRAMUSCULAR | Status: AC
  Filled 2013-11-01: qty 1

## 2013-11-01 NOTE — Patient Instructions (Signed)
[facility] Discharge Instructions for Patients Receiving Chemotherapy  Today you received the following chemotherapy agents Taxol,carboplatin  To help prevent nausea and vomiting after your treatment, we encourage you to take your nausea medication Compazine   If you develop nausea and vomiting that is not controlled by your nausea medication, call the clinic.   BELOW ARE SYMPTOMS THAT SHOULD BE REPORTED IMMEDIATELY:  *FEVER GREATER THAN 100.5 F  *CHILLS WITH OR WITHOUT FEVER  NAUSEA AND VOMITING THAT IS NOT CONTROLLED WITH YOUR NAUSEA MEDICATION  *UNUSUAL SHORTNESS OF BREATH  *UNUSUAL BRUISING OR BLEEDING  TENDERNESS IN MOUTH AND THROAT WITH OR WITHOUT PRESENCE OF ULCERS  *URINARY PROBLEMS  *BOWEL PROBLEMS  UNUSUAL RASH Items with * indicate a potential emergency and should be followed up as soon as possible.  Feel free to call the clinic you have any questions or concerns. The clinic phone number is [phone].

## 2013-11-02 ENCOUNTER — Ambulatory Visit: Payer: Medicare Other

## 2013-11-02 ENCOUNTER — Ambulatory Visit
Admission: RE | Admit: 2013-11-02 | Discharge: 2013-11-02 | Disposition: A | Discharge status: Home / Self Care | Payer: Medicare Other | Source: Ambulatory Visit | Attending: Radiation Oncology | Admitting: Radiation Oncology

## 2013-11-02 ENCOUNTER — Other Ambulatory Visit: Payer: Self-pay | Admitting: Internal Medicine

## 2013-11-03 ENCOUNTER — Ambulatory Visit: Payer: Medicare Other

## 2013-11-03 ENCOUNTER — Ambulatory Visit
Admission: RE | Admit: 2013-11-03 | Discharge: 2013-11-03 | Disposition: A | Discharge status: Home / Self Care | Payer: Medicare Other | Source: Ambulatory Visit | Attending: Radiation Oncology | Admitting: Radiation Oncology

## 2013-11-03 ENCOUNTER — Telehealth: Payer: Self-pay | Admitting: Internal Medicine

## 2013-11-03 ENCOUNTER — Encounter: Payer: Self-pay | Admitting: Radiation Oncology

## 2013-11-03 MED ORDER — ALBUTEROL SULFATE HFA 108 (90 BASE) MCG/ACT IN AERS: 2.0000 | INHALATION_SPRAY | Freq: Four times a day (QID) | RESPIRATORY_TRACT | Status: DC | PRN

## 2013-11-03 NOTE — Telephone Encounter (Signed)
Ok with me with 11 refills

## 2013-11-03 NOTE — Telephone Encounter (Signed)
Refill sent, pt is aware. [name], CMA  

## 2013-11-03 NOTE — Telephone Encounter (Signed)
I spoke with the pt and she is requesting an rx be sent for ventolin inhaler. I do not see this on pt med list or mentioned in OV note. Pt states she was given a sample at last OV. Please advise if ok to send in Rx. Carron Curie, CMA

## 2013-11-03 NOTE — Progress Notes (Signed)
Department of Radiation Oncology  Phone:  [phone] Fax:        [phone]  Weekly Treatment Note    Name: [name] Date: 11/03/2013 MRN: [id number] DOB: [birth year]   Current dose: 24 Gy  Current fraction: 12   MEDICATIONS: Current Outpatient Prescriptions  Medication Sig Dispense Refill  . Aclidinium Bromide (TUDORZA PRESSAIR) 400 MCG/ACT AEPB Inhale 1 puff into the lungs 2 (two) times daily.      [name] albuterol (PROVENTIL) (2.5 MG/3ML) 0.083% nebulizer solution Take 2.5 mg by nebulization as needed for wheezing.      [name] albuterol (PROVENTIL) (5 MG/ML) 0.5% nebulizer solution Take 2.5 mg by nebulization every 6 (six) hours as needed for wheezing.      [name] albuterol (VENTOLIN HFA) 108 (90 BASE) MCG/ACT inhaler Inhale 2 puffs into the lungs every 6 (six) hours as needed for wheezing or shortness of breath.  1 Inhaler  11  . ALPRAZolam (XANAX) 1 MG tablet Take 2 tablets (2 mg total) by mouth 4 (four) times daily.  90 tablet  0  . amoxicillin-clavulanate (AUGMENTIN) 875-125 MG per tablet Take 1 tablet by mouth 2 (two) times daily.  20 tablet  0  . atenolol (TENORMIN) 25 MG tablet Take 25 mg by mouth daily.      . benzonatate (TESSALON) 200 MG capsule TAKE (1) CAPSULE THREE TIMES DAILY AS NEEDED FOR COUGH.  30 capsule  1  . chlorzoxazone (PARAFON) 500 MG tablet Take 500 mg by mouth 4 (four) times daily as needed for muscle spasms.      [name] dextromethorphan-guaiFENesin (MUCINEX DM) 30-600 MG per 12 hr tablet Take 1 tablet by mouth every 12 (twelve) hours.      . diphenhydrAMINE (BENADRYL) 25 mg capsule Take 25 mg by mouth 2 (two) times daily.       . diphenoxylate-atropine (LOMOTIL) 2.5-0.025 MG per tablet Take 2 tablets by mouth 4 (four) times daily as needed for diarrhea or loose stools.  40 tablet  0  . esomeprazole (NEXIUM) 40 MG capsule Take 40 mg by mouth daily before breakfast.      . HYDROcodone-acetaminophen (NORCO) 10-325 MG per tablet Take 1 tablet by mouth every 4  (four) hours as needed for pain.  90 tablet  0  . nicotine (NICODERM CQ - DOSED IN MG/24 HOURS) 14 mg/24hr patch Place 1 patch onto the skin daily.  28 patch  0  . prochlorperazine (COMPAZINE) 10 MG tablet Take 1 tablet (10 mg total) by mouth every 6 (six) hours as needed.  60 tablet  0  . sertraline (ZOLOFT) 100 MG tablet Take 200 mg by mouth daily.       . trazodone (DESYREL) 300 MG tablet Take 300 mg by mouth at bedtime.       No current facility-administered medications for this encounter.     ALLERGIES: Prednisone; Nsaids; Aspirin; and Dulera   LABORATORY DATA:  Lab Results  Component Value Date   WBC 6.3 11/01/2013   HGB 11.6 11/01/2013   HCT 35.5 11/01/2013   MCV 91.3 11/01/2013   PLT 274 11/01/2013   Lab Results  Component Value Date   NA 134* 11/01/2013   K 4.7 11/01/2013   CL 101 09/06/2013   CO2 24 11/01/2013   Lab Results  Component Value Date   ALT 7 11/01/2013   AST 13 11/01/2013   ALKPHOS 131 11/01/2013   BILITOT 0.31 11/01/2013     NARRATIVE: [name] was seen today for  weekly treatment management. The chart was checked and the patient's films were reviewed. The patient is feeling better. She is taking Augmentin and this seems to be helping. We increased her dose of Xanax and this also has helped the patient get through treatment.  PHYSICAL EXAMINATION: weight is 174 lb (78.926 kg). Her oral temperature is 98.4 F (36.9 C). Her blood pressure is 123/67 and her pulse is 66. Her respiration is 20.        ASSESSMENT: The patient is doing satisfactorily with treatment.  PLAN: We will continue with the patient's radiation treatment as planned.

## 2013-11-03 NOTE — Progress Notes (Signed)
[facility] Psychosocial Distress Screening Clinical Social Work  Clinical Social Work was referred by distress screening protocol.  The patient scored a 10 on the Psychosocial Distress Thermometer which indicates severe distress. Clinical Social Worker Intern telephoned to assess for distress and other psychosocial needs. Patient did not answer the phone and CSWI unable to leave message.   Clinical Social Worker follow up needed: yes  If yes, follow up plan: Follow up call   Park Beck S. [facility] Clinical Social Work Intern [employer] [facility] [phone]

## 2013-11-03 NOTE — Progress Notes (Addendum)
Weekly rad txs rt lung, on nicotine patch 21mg  That is helping a lot, still coughing up small amts gren/yellow sputum, is takingg her Augmentin, and the xanax is helping her nerves  Per patient stated, eating good, ate grits and toast, drank 1/2 pot coffee, 2 glasses tea, no lunch as yet,, , encouraged to eat more protein,  Drinking  Plenty fluids room air 97%, takes imodium for diarrhea Energy better no nausea 3:31 PM

## 2013-11-04 ENCOUNTER — Ambulatory Visit
Admission: RE | Admit: 2013-11-04 | Discharge: 2013-11-04 | Disposition: A | Discharge status: Home / Self Care | Payer: Medicare Other | Source: Ambulatory Visit | Attending: Radiation Oncology | Admitting: Radiation Oncology

## 2013-11-04 ENCOUNTER — Ambulatory Visit: Payer: Medicare Other

## 2013-11-04 NOTE — Progress Notes (Signed)
[facility] Psychosocial Distress Screening Clinical Social Work  Clinical Social Work was referred by distress screening protocol.  The patient scored a 10 on the Psychosocial Distress Thermometer which indicates severe distress. Clinical Social Worker Intern telephoned to assess for distress and other psychosocial needs. Patient stated that she had a slight burning in chest but that everything was all right.  CSWI questioned Patient's screening indication of a food issue.  Patient stated that she had been able to resolve.  Patient was made aware of SW services and agrees to seek out further assistance if needed.  Patient indicated she was very appreciative of phone call.   Clinical Social Worker follow up needed: no  If yes, follow up plan:   Kunal Levario S. [facility] Clinical Social Work Intern [employer] [facility] [phone]

## 2013-11-05 ENCOUNTER — Ambulatory Visit
Admission: RE | Admit: 2013-11-05 | Discharge: 2013-11-05 | Disposition: A | Discharge status: Home / Self Care | Payer: Medicare Other | Source: Ambulatory Visit | Attending: Radiation Oncology | Admitting: Radiation Oncology

## 2013-11-05 ENCOUNTER — Ambulatory Visit: Payer: Medicare Other

## 2013-11-08 ENCOUNTER — Other Ambulatory Visit (HOSPITAL_BASED_OUTPATIENT_CLINIC_OR_DEPARTMENT_OTHER): Payer: Medicare Other | Admitting: Lab

## 2013-11-08 ENCOUNTER — Other Ambulatory Visit: Payer: Medicare Other | Admitting: Lab

## 2013-11-08 ENCOUNTER — Ambulatory Visit (HOSPITAL_BASED_OUTPATIENT_CLINIC_OR_DEPARTMENT_OTHER): Payer: Medicare Other | Admitting: Physician Assistant

## 2013-11-08 ENCOUNTER — Telehealth: Payer: Self-pay | Admitting: Internal Medicine

## 2013-11-08 ENCOUNTER — Ambulatory Visit: Payer: Medicare Other

## 2013-11-08 ENCOUNTER — Encounter: Payer: Self-pay | Admitting: Physician Assistant

## 2013-11-08 ENCOUNTER — Telehealth: Payer: Self-pay | Admitting: *Deleted

## 2013-11-08 ENCOUNTER — Ambulatory Visit (HOSPITAL_BASED_OUTPATIENT_CLINIC_OR_DEPARTMENT_OTHER): Payer: Medicare Other

## 2013-11-08 ENCOUNTER — Other Ambulatory Visit: Payer: Self-pay | Admitting: Radiation Oncology

## 2013-11-08 ENCOUNTER — Ambulatory Visit
Admission: RE | Admit: 2013-11-08 | Discharge: 2013-11-08 | Disposition: A | Discharge status: Home / Self Care | Payer: Medicare Other | Source: Ambulatory Visit | Attending: Radiation Oncology | Admitting: Radiation Oncology

## 2013-11-08 DIAGNOSIS — C3430 Malignant neoplasm of lower lobe, unspecified bronchus or lung: Secondary | ICD-10-CM

## 2013-11-08 DIAGNOSIS — Z5111 Encounter for antineoplastic chemotherapy: Secondary | ICD-10-CM

## 2013-11-08 DIAGNOSIS — R079 Chest pain, unspecified: Secondary | ICD-10-CM

## 2013-11-08 DIAGNOSIS — C3491 Malignant neoplasm of unspecified part of right bronchus or lung: Secondary | ICD-10-CM

## 2013-11-08 DIAGNOSIS — F17200 Nicotine dependence, unspecified, uncomplicated: Secondary | ICD-10-CM

## 2013-11-08 LAB — CBC WITH DIFFERENTIAL/PLATELET
BASO%: 0.8 % (ref 0.0–2.0)
EOS%: 1.5 % (ref 0.0–7.0)
Eosinophils Absolute: 0.1 10*3/uL (ref 0.0–0.5)
HCT: 34.4 % — ABNORMAL LOW (ref 34.8–46.6)
LYMPH%: 16.6 % (ref 14.0–49.7)
MCH: 29.7 pg (ref 25.1–34.0)
MCHC: 32 g/dL (ref 31.5–36.0)
MCV: 93 fL (ref 79.5–101.0)
MONO#: 0.4 10*3/uL (ref 0.1–0.9)
MONO%: 9.3 % (ref 0.0–14.0)
NEUT#: 3.4 10*3/uL (ref 1.5–6.5)
NEUT%: 71.8 % (ref 38.4–76.8)
Platelets: 221 10*3/uL (ref 145–400)
RBC: 3.7 10*6/uL (ref 3.70–5.45)
RDW: 14 % (ref 11.2–14.5)
nRBC: 0 % (ref 0–0)

## 2013-11-08 LAB — COMPREHENSIVE METABOLIC PANEL (CC13)
ALT: 17 U/L (ref 0–55)
AST: 16 U/L (ref 5–34)
Anion Gap: 9 mEq/L (ref 3–11)
BUN: 5.8 mg/dL — ABNORMAL LOW (ref 7.0–26.0)
CO2: 23 mEq/L (ref 22–29)
Calcium: 8.8 mg/dL (ref 8.4–10.4)
Chloride: 107 mEq/L (ref 98–109)
Creatinine: 0.7 mg/dL (ref 0.6–1.1)
Sodium: 139 mEq/L (ref 136–145)
Total Bilirubin: <0.2 mg/dL (ref 0.20–1.20)
Total Protein: 6.6 g/dL (ref 6.4–8.3)

## 2013-11-08 MED ORDER — DIPHENHYDRAMINE HCL 50 MG/ML IJ SOLN
50.0000 mg | Freq: Once | INTRAMUSCULAR | Status: AC
  Administered 2013-11-08: 50 mg via INTRAVENOUS

## 2013-11-08 MED ORDER — FAMOTIDINE IN NACL 20-0.9 MG/50ML-% IV SOLN
INTRAVENOUS | Status: AC
  Filled 2013-11-08: qty 50

## 2013-11-08 MED ORDER — DEXAMETHASONE SODIUM PHOSPHATE 20 MG/5ML IJ SOLN
20.0000 mg | Freq: Once | INTRAMUSCULAR | Status: AC
  Administered 2013-11-08: 20 mg via INTRAVENOUS

## 2013-11-08 MED ORDER — ONDANSETRON 16 MG/50ML IVPB (CHCC)
16.0000 mg | Freq: Once | INTRAVENOUS | Status: AC
  Administered 2013-11-08: 16 mg via INTRAVENOUS

## 2013-11-08 MED ORDER — DIPHENHYDRAMINE HCL 50 MG/ML IJ SOLN
INTRAMUSCULAR | Status: AC
  Filled 2013-11-08: qty 1

## 2013-11-08 MED ORDER — SODIUM CHLORIDE 0.9 % IV SOLN
189.8000 mg | Freq: Once | INTRAVENOUS | Status: AC
  Administered 2013-11-08: 190 mg via INTRAVENOUS
  Filled 2013-11-08: qty 19

## 2013-11-08 MED ORDER — SODIUM CHLORIDE 0.9 % IV SOLN
Freq: Once | INTRAVENOUS | Status: AC
  Administered 2013-11-08: 16:00:00 via INTRAVENOUS

## 2013-11-08 MED ORDER — DEXAMETHASONE SODIUM PHOSPHATE 20 MG/5ML IJ SOLN
INTRAMUSCULAR | Status: AC
  Filled 2013-11-08: qty 5

## 2013-11-08 MED ORDER — FAMOTIDINE IN NACL 20-0.9 MG/50ML-% IV SOLN
20.0000 mg | Freq: Once | INTRAVENOUS | Status: AC
  Administered 2013-11-08: 20 mg via INTRAVENOUS

## 2013-11-08 MED ORDER — HYDROCODONE-ACETAMINOPHEN 10-325 MG PO TABS: 1.0000 | ORAL_TABLET | ORAL | Status: DC | PRN

## 2013-11-08 MED ORDER — PACLITAXEL CHEMO INJECTION 300 MG/50ML
45.0000 mg/m2 | Freq: Once | INTRAVENOUS | Status: AC
  Administered 2013-11-08: 84 mg via INTRAVENOUS
  Filled 2013-11-08: qty 14

## 2013-11-08 NOTE — Telephone Encounter (Signed)
Called pt and spoke about smoking cessation.  She stated she is doing fine.  I gave her encouragement to continue smoking cessation.  I stated I will send more information on smoking cessation in the mail.  I notified her to call me for any further assistance with smoking cessation.

## 2013-11-08 NOTE — Telephone Encounter (Signed)
gv and printed appt sched and avs for pt for NOV and DEC....sed added tx for DEC

## 2013-11-08 NOTE — Progress Notes (Signed)
Patient stopped by the clinic and requested a refill on her hydrocodone acetaminophen.  She is rating her pain in her knees and midsternal area at an 8/10.  Notified Dr. Roselind Messier, who refilled the prescription.

## 2013-11-08 NOTE — Patient Instructions (Signed)
Continue with your course of concurrent chemoradiation as scheduled Followup in 3 weeks 

## 2013-11-08 NOTE — Telephone Encounter (Signed)
Patient called requesting refill on pain medication,she only has 1 lefrt, advised her to come to nursing afterwards to see on call MD Dr.Moody is off today, she also stated she coughed up something that looked like an oyster over the weekendasked if that was part of her tumor, didn't see so couldn't tell her yes or no, will see her after her treatment today 8:38 AM

## 2013-11-08 NOTE — Patient Instructions (Signed)
[facility] Discharge Instructions for Patients Receiving Chemotherapy  Today you received the following chemotherapy agents:  Taxol & Carboplatin  To help prevent nausea and vomiting after your treatment, we encourage you to take your nausea medication    If you develop nausea and vomiting that is not controlled by your nausea medication, call the clinic.   BELOW ARE SYMPTOMS THAT SHOULD BE REPORTED IMMEDIATELY:  *FEVER GREATER THAN 100.5 F  *CHILLS WITH OR WITHOUT FEVER  NAUSEA AND VOMITING THAT IS NOT CONTROLLED WITH YOUR NAUSEA MEDICATION  *UNUSUAL SHORTNESS OF BREATH  *UNUSUAL BRUISING OR BLEEDING  TENDERNESS IN MOUTH AND THROAT WITH OR WITHOUT PRESENCE OF ULCERS  *URINARY PROBLEMS  *BOWEL PROBLEMS  UNUSUAL RASH Items with * indicate a potential emergency and should be followed up as soon as possible.  Feel free to call the clinic you have any questions or concerns. The clinic phone number is [phone].    

## 2013-11-08 NOTE — Progress Notes (Addendum)
[facility] [phone]   [phone]  SHARED VISIT PROGRESS NOTE  [name], MD  [address]  [place] [address]  DIAGNOSIS: Locally advanced, likely stage IIIA (T2a., N2, M0) non-small cell lung cancer, squamous cell carcinoma diagnosed in September of 2014.   PRIOR THERAPY: None   CURRENT THERAPY: Concurrent chemoradiation with weekly carboplatin for AUC of 2 and paclitaxel 45 mg/M2, status post 3 cycles. First dose was given on 10/18/2013.   CHEMOTHERAPY INTENT: Curative/control  CURRENT # OF CHEMOTHERAPY CYCLES: 4  CURRENT ANTIEMETICS: Zofran, Compazine and dexamethasone  CURRENT SMOKING STATUS: Current smoker and she was strongly advised to quit smoking and also to smoke cessation program  ORAL CHEMOTHERAPY AND CONSENT: None  CURRENT BISPHOSPHONATES USE: None  PAIN MANAGEMENT: 0/10  NARCOTICS INDUCED CONSTIPATION: None  LIVING WILL AND CODE STATUS: Full code  INTERVAL HISTORY: [name] 67 y.o. female returns to the clinic today for follow up visit accompanied by her son. Overall she's tolerating her course of concurrent chemoradiation relatively well with the exception of some continued lower midsternum/rib cage chest pain. She reports that her breathing is actually better. She reports the bad coughing spell over the weekend where she "coughed up some thing that looked like an oyster with the heart center". This was not associated with any blood in she's had no further episodes. She also complains of bilateral knee pain but states that she needs to have knee replacements. She's had some nausea but this is well-controlled with her Compazine.  She denied having any significant weight loss or night sweats. The patient unfortunately continues to smoke few cigarettes every day but she is trying to quit.  MEDICAL HISTORY: Past Medical History  Diagnosis Date  . H/O: stroke 2012, 2000,     X3  . Hypertension   . Anxiety   . PTSD  (post-traumatic stress disorder)   . Depression   . SVD (spontaneous vaginal delivery)     x 1  . Smoker   . Osteoarthritis     knees, hips hands,   . Shortness of breath   . Pain     SEVERE PAIN BACK, RIGHT HIP AND KNEES--HARRINGTON RODS AND CERVICAL PLATES--USES WALKER OR CANE WHEN AMBULATING - GOES TO A PAIN CLINIC-STATES SHE NEEDS RT HIP AND BOTH KNEES REPLACED. PT STATES SHE WAS TOLD THE CEMENT AROUND THE HARRINGTON RODS IS CRACKED.  [name] Hot flashes, menopausal     SEVERE  . Family history of anesthesia complication     equipment failure caused problem  . Complication of anesthesia 03/02/13    severe headache,vertigo postop  . COPD (chronic obstructive pulmonary disease)     stable  . 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 Lung cancer     ALLERGIES:  is allergic to prednisone; nsaids; aspirin; and dulera.  MEDICATIONS:  Current Outpatient Prescriptions  Medication Sig Dispense Refill  . Aclidinium Bromide (TUDORZA PRESSAIR) 400 MCG/ACT AEPB Inhale 1 puff into the lungs 2 (two) times daily.      [name] albuterol (PROVENTIL) (2.5 MG/3ML) 0.083% nebulizer solution Take 2.5 mg by nebulization as needed for wheezing.      [name] albuterol (PROVENTIL) (5 MG/ML) 0.5% nebulizer solution Take 2.5 mg by nebulization every 6 (six) hours as needed for wheezing.      [name] albuterol (VENTOLIN HFA) 108 (90 BASE) MCG/ACT inhaler Inhale 2 puffs into the lungs every 6 (six) hours as needed for wheezing  or shortness of breath.  1 Inhaler  11  . ALPRAZolam (XANAX) 1 MG tablet Take 2 tablets (2 mg total) by mouth 4 (four) times daily.  90 tablet  0  . atenolol (TENORMIN) 25 MG tablet Take 25 mg by mouth daily.      . benzonatate (TESSALON) 200 MG capsule TAKE (1) CAPSULE THREE TIMES DAILY AS NEEDED FOR COUGH.  30 capsule  1  . chlorzoxazone (PARAFON) 500 MG tablet Take 500 mg by mouth 4 (four) times daily as needed for muscle spasms.      [name] dextromethorphan-guaiFENesin (MUCINEX DM) 30-600 MG per 12 hr tablet Take 1  tablet by mouth every 12 (twelve) hours.      . diphenhydrAMINE (BENADRYL) 25 mg capsule Take 25 mg by mouth 2 (two) times daily.       . diphenoxylate-atropine (LOMOTIL) 2.5-0.025 MG per tablet Take 2 tablets by mouth 4 (four) times daily as needed for diarrhea or loose stools.  40 tablet  0  . esomeprazole (NEXIUM) 40 MG capsule Take 40 mg by mouth daily before breakfast.      . HYDROcodone-acetaminophen (NORCO) 10-325 MG per tablet Take 1 tablet by mouth every 4 (four) hours as needed.  90 tablet  0  . nicotine (NICODERM CQ - DOSED IN MG/24 HOURS) 14 mg/24hr patch Place 1 patch onto the skin daily.  28 patch  0  . prochlorperazine (COMPAZINE) 10 MG tablet Take 1 tablet (10 mg total) by mouth every 6 (six) hours as needed.  60 tablet  0  . sertraline (ZOLOFT) 100 MG tablet Take 200 mg by mouth daily.       . trazodone (DESYREL) 300 MG tablet Take 300 mg by mouth at bedtime.      [name] amoxicillin-clavulanate (AUGMENTIN) 875-125 MG per tablet Take 1 tablet by mouth 2 (two) times daily.  20 tablet  0   No current facility-administered medications for this visit.   Facility-Administered Medications Ordered in Other Visits  Medication Dose Route Frequency Provider Last Rate Last Dose  . CARBOplatin (PARAPLATIN) 190 mg in sodium chloride 0.9 % 100 mL chemo infusion  190 mg Intravenous Once Si Gaul, MD      . PACLitaxel (TAXOL) 84 mg in dextrose 5 % 250 mL chemo infusion (</= 80mg /m2)  45 mg/m2 (Treatment Plan Actual) Intravenous Once Si Gaul, MD 264 mL/hr at 11/08/13 1620 84 mg at 11/08/13 1620    SURGICAL HISTORY:  Past Surgical History  Procedure Laterality Date  . Carpal tunnel release      BILATERAL  . Nasal sinus surgery    . Lumbar fusion      L4-S1  . Cervical fusion    . Hernia repair    . Salpingoophorectomy Right   . Vaginal hysterectomy      TAH w/ ovary removal  . Transthoracic echocardiogram  12-03-2011    LVSF NORMAL/ EF 60-65%  . Vulvectomy N/A 03/02/2013     Procedure: WIDE LOCAL EXCISION VULVAR;  Surgeon: Rejeana Brock A. Duard Brady, MD;  Location: [facility];  Service: Gynecology;  Laterality: N/A;  . Co2 laser application N/A 05/13/2013    Procedure: LASER APPLICATION OF THE VULVA;  Surgeon: Laurette Schimke, MD;  Location: [facility];  Service: Gynecology;  Laterality: N/A;  . Video bronchoscopy Bilateral 09/23/2013    Procedure: VIDEO BRONCHOSCOPY WITHOUT FLUORO;  Surgeon: Nyoka Cowden, MD;  Location: [facility];  Service: Cardiopulmonary;  Laterality: Bilateral;    REVIEW OF SYSTEMS:  A  comprehensive review of systems was negative except for: Constitutional: positive for fatigue Respiratory: positive for dyspnea on exertion   PHYSICAL EXAMINATION: General appearance: alert, cooperative, fatigued and no distress Head: Normocephalic, without obvious abnormality, atraumatic Neck: no adenopathy, no JVD, supple, symmetrical, trachea midline and thyroid not enlarged, symmetric, no tenderness/mass/nodules Lymph nodes: Cervical, supraclavicular, and axillary nodes normal. Resp: wheezes bilaterally Back: symmetric, no curvature. ROM normal. No CVA tenderness. Cardio: regular rate and rhythm, S1, S2 normal, no murmur, click, rub or gallop GI: soft, non-tender; bowel sounds normal; no masses,  no organomegaly Extremities: extremities normal, atraumatic, no cyanosis or edema  ECOG PERFORMANCE STATUS: 1 - Symptomatic but completely ambulatory  Blood pressure 107/66, pulse 71, temperature 97.3 F (36.3 C), temperature source Oral, resp. rate 17, height 5\' 6"  (1.676 m), weight 179 lb 3.2 oz (81.285 kg), SpO2 96.00%.  LABORATORY DATA: Lab Results  Component Value Date   WBC 4.8 11/08/2013   HGB 11.0* 11/08/2013   HCT 34.4* 11/08/2013   MCV 93.0 11/08/2013   PLT 221 11/08/2013      Chemistry      Component Value Date/Time   NA 139 11/08/2013 1315   NA 137 09/06/2013 0630   K 4.2 11/08/2013 1315   K 3.9 09/06/2013 0630   CL 101 09/06/2013 0630    CO2 23 11/08/2013 1315   CO2 28 09/06/2013 0630   BUN 5.8* 11/08/2013 1315   BUN 6 09/06/2013 0630   CREATININE 0.7 11/08/2013 1315   CREATININE 0.63 09/06/2013 0630      Component Value Date/Time   CALCIUM 8.8 11/08/2013 1315   CALCIUM 8.9 09/06/2013 0630   ALKPHOS 134 11/08/2013 1315   ALKPHOS 79 08/06/2011 0101   AST 16 11/08/2013 1315   AST 13 08/06/2011 0101   ALT 17 11/08/2013 1315   ALT 6 08/06/2011 0101   BILITOT <0.20 11/08/2013 1315   BILITOT 0.4 08/06/2011 0101       RADIOGRAPHIC STUDIES: Mr Laqueta Jean Wo Contrast  10/26/2013   CLINICAL DATA:  Right ear pain for 3 months. Lung cancer.  EXAM: MRI HEAD WITHOUT AND WITH CONTRAST  TECHNIQUE: Multiplanar, multiecho pulse sequences of the brain and surrounding structures were obtained according to standard protocol without and with intravenous contrast  CONTRAST:  16mL MULTIHANCE GADOBENATE DIMEGLUMINE 529 MG/ML IV SOLN  COMPARISON:  CT head 08/06/2011.  FINDINGS: The patient was unable to remain motionless for the exam. Small or subtle lesions could be overlooked.  No evidence for acute infarction, hemorrhage, mass lesion, hydrocephalus, or extra-axial fluid. Mild atrophy. Mild chronic microvascular ischemic change. No lacunar or large vessel infarct. Tiny focus of chronic hemorrhage right inferior temporal lobe.  There is slight heterogeneity of the calvarium, skull base, and upper cervical region. Osseous metastatic disease is not excluded. There is slight nonspecific flattening of the C4 vertebral body not present on previous study from 2005.  Normal pituitary and cerebellar tonsils. Flow voids are maintained in the carotid, basilar, and vertebral arteries.  Post infusion, there is no abnormal enhancement of the brain or meninges. Note intra-axial metastatic disease is observed. The scalp and extracranial soft tissues appear unremarkable. There is no sinus or mastoid disease. Negative orbits.  Compared with prior CT, the atrophy may have mildly  progressed.  IMPRESSION: No intracranial metastatic disease is observed.  Mild atrophy and mild chronic microvascular ischemic change.  Heterogeneity of the calvarium, skull base, and upper cervical region could represent early osseous metastatic disease.   Electronically Signed  By: Davonna Belling M.D.   On: 10/07/2013 13:42   Nm Pet Image Initial (pi) Skull Base To Thigh  10/07/2013   CLINICAL DATA:  Initial treatment strategy for lung cancer.  EXAM: NUCLEAR MEDICINE PET SKULL BASE TO THIGH  FASTING BLOOD GLUCOSE:  Value:  104 mg/dl  TECHNIQUE: 16.1 mCi W-96 FDG was injected intravenously. CT data was obtained and used for attenuation correction and anatomic localization only. (This was not acquired as a diagnostic CT examination.) Additional exam technical data entered on technologist worksheet.  COMPARISON:  09/04/2013 ; 11/20/2012  FINDINGS: NECK  Negative.  CHEST  4.6 x 4.1 cm right lower lobe mass extends to the my in a minor fissure/ hilar region and has a maximum standard uptake value the of 18.1. There is associated postobstructive pneumonitis and occlusion of the right lower lobe bronchus. Trace right pleural effusion.  Coronary atherosclerosis noted. 1 cm right lower paratracheal lymph node does not have activity higher than background mediastinal tissues ; similar AP window and small anterior mediastinal lymph nodes do not appear hypermetabolic.  Trace pericardial effusion.  ABDOMEN/PELVIS  Aortoiliac atherosclerotic vascular disease. No findings of metastatic disease to the abdomen/ pelvis.  SKELETON  No focal hypermetabolic activity to suggest skeletal metastasis.  IMPRESSION: 1. Highly hypermetabolic right lower lobe mass compatible with malignancy. Borderline enlarged mediastinal lymph nodes are not hypermetabolic. 2. Atherosclerosis. 3. Trace pericardial effusion and trace right pleural effusion.   Electronically Signed   By: Herbie Baltimore M.D.   On: 10/07/2013 14:41    ASSESSMENT AND PLAN:  this is a very pleasant 67 years old white female with stage IIIa non-small cell lung cancer currently undergoing concurrent chemoradiation with weekly carboplatin and paclitaxel and tolerating her treatment fairly well. Patient was discussed with also seen by Dr. Arbutus Ped. She will continue with her course of concurrent chemoradiation as scheduled. She'll return in 3 weeks for another symptom management visit with a repeat CBC differential and C. met.  [name], Gabriela Lewin E, PA-C   She was advised to call immediately if she has any concerning symptoms in the interval.  The patient voices understanding of current disease status and treatment options and is in agreement with the current care plan.  All questions were answered. The patient knows to call the clinic with any problems, questions or concerns. We can certainly see the patient much sooner if necessary.  ADDENDUM: Hematology/Oncology Attending: I had a face to face encounter with the patient.. I recommended her care plan. This is a very pleasant 67 years old white female recently diagnosed with a stage IIIa non-small cell lung cancer. The patient is currently undergoing concurrent chemoradiation with weekly carboplatin and paclitaxel status post 3 cycles. She is tolerating her treatment fairly well with no significant adverse effects. I recommended for the patient to continue her current treatment regimen. She will come back for follow up visit in 3 weeks for reevaluation and management any adverse effect of her treatment. She was advised to call immediately if she has any concerning symptoms in the interval.  Lajuana Matte., MD 11/10/2013

## 2013-11-09 ENCOUNTER — Ambulatory Visit
Admission: RE | Admit: 2013-11-09 | Discharge: 2013-11-09 | Disposition: A | Discharge status: Home / Self Care | Payer: Medicare Other | Source: Ambulatory Visit | Attending: Radiation Oncology | Admitting: Radiation Oncology

## 2013-11-09 ENCOUNTER — Ambulatory Visit: Payer: Medicare Other

## 2013-11-10 ENCOUNTER — Ambulatory Visit: Payer: Medicare Other

## 2013-11-10 ENCOUNTER — Ambulatory Visit
Admission: RE | Admit: 2013-11-10 | Discharge: 2013-11-10 | Disposition: A | Discharge status: Home / Self Care | Payer: Medicare Other | Source: Ambulatory Visit | Attending: Radiation Oncology | Admitting: Radiation Oncology

## 2013-11-11 ENCOUNTER — Ambulatory Visit: Payer: Medicare Other

## 2013-11-11 ENCOUNTER — Ambulatory Visit
Admission: RE | Admit: 2013-11-11 | Discharge: 2013-11-11 | Disposition: A | Discharge status: Home / Self Care | Payer: Medicare Other | Source: Ambulatory Visit | Attending: Radiation Oncology | Admitting: Radiation Oncology

## 2013-11-12 ENCOUNTER — Ambulatory Visit: Payer: Medicare Other

## 2013-11-12 ENCOUNTER — Encounter: Payer: Self-pay | Admitting: Radiation Oncology

## 2013-11-12 ENCOUNTER — Ambulatory Visit
Admission: RE | Admit: 2013-11-12 | Discharge: 2013-11-12 | Disposition: A | Discharge status: Home / Self Care | Payer: Medicare Other | Source: Ambulatory Visit | Attending: Radiation Oncology | Admitting: Radiation Oncology

## 2013-11-12 MED ORDER — SUCRALFATE 1 G PO TABS: 1.0000 g | ORAL_TABLET | Freq: Four times a day (QID) | ORAL | Status: DC

## 2013-11-12 MED ORDER — ALPRAZOLAM 1 MG PO TABS: 2.0000 mg | ORAL_TABLET | Freq: Four times a day (QID) | ORAL | Status: DC

## 2013-11-12 NOTE — Progress Notes (Signed)
Department of Radiation Oncology  Phone:  [phone] Fax:        [phone]  Weekly Treatment Note    Name: [name] Date: 11/12/2013 MRN: [id number] DOB: Jan 17, 1946   Current dose: 38 Gy  Current fraction: 19   MEDICATIONS: Current Outpatient Prescriptions  Medication Sig Dispense Refill  . Aclidinium Bromide (TUDORZA PRESSAIR) 400 MCG/ACT AEPB Inhale 1 puff into the lungs 2 (two) times daily.      [name] albuterol (PROVENTIL) (2.5 MG/3ML) 0.083% nebulizer solution Take 2.5 mg by nebulization as needed for wheezing.      [name] albuterol (PROVENTIL) (5 MG/ML) 0.5% nebulizer solution Take 2.5 mg by nebulization every 6 (six) hours as needed for wheezing.      [name] albuterol (VENTOLIN HFA) 108 (90 BASE) MCG/ACT inhaler Inhale 2 puffs into the lungs every 6 (six) hours as needed for wheezing or shortness of breath.  1 Inhaler  11  . ALPRAZolam (XANAX) 1 MG tablet Take 2 tablets (2 mg total) by mouth 4 (four) times daily.  90 tablet  0  . amoxicillin-clavulanate (AUGMENTIN) 875-125 MG per tablet Take 1 tablet by mouth 2 (two) times daily.  20 tablet  0  . atenolol (TENORMIN) 25 MG tablet Take 25 mg by mouth daily.      . benzonatate (TESSALON) 200 MG capsule TAKE (1) CAPSULE THREE TIMES DAILY AS NEEDED FOR COUGH.  30 capsule  1  . chlorzoxazone (PARAFON) 500 MG tablet Take 500 mg by mouth 4 (four) times daily as needed for muscle spasms.      [name] dextromethorphan-guaiFENesin (MUCINEX DM) 30-600 MG per 12 hr tablet Take 1 tablet by mouth every 12 (twelve) hours.      . diphenhydrAMINE (BENADRYL) 25 mg capsule Take 25 mg by mouth 2 (two) times daily.       . diphenoxylate-atropine (LOMOTIL) 2.5-0.025 MG per tablet Take 2 tablets by mouth 4 (four) times daily as needed for diarrhea or loose stools.  40 tablet  0  . esomeprazole (NEXIUM) 40 MG capsule Take 40 mg by mouth daily before breakfast.      . HYDROcodone-acetaminophen (NORCO) 10-325 MG per tablet Take 1 tablet by mouth every 4  (four) hours as needed.  90 tablet  0  . nicotine (NICODERM CQ - DOSED IN MG/24 HOURS) 14 mg/24hr patch Place 1 patch onto the skin daily.  28 patch  0  . prochlorperazine (COMPAZINE) 10 MG tablet Take 1 tablet (10 mg total) by mouth every 6 (six) hours as needed.  60 tablet  0  . sertraline (ZOLOFT) 100 MG tablet Take 200 mg by mouth daily.       . trazodone (DESYREL) 300 MG tablet Take 300 mg by mouth at bedtime.      . sucralfate (CARAFATE) 1 G tablet Take 1 tablet (1 g total) by mouth 4 (four) times daily.  120 tablet  2   No current facility-administered medications for this encounter.     ALLERGIES: Prednisone; Nsaids; Aspirin; and Dulera   LABORATORY DATA:  Lab Results  Component Value Date   WBC 4.8 11/08/2013   HGB 11.0* 11/08/2013   HCT 34.4* 11/08/2013   MCV 93.0 11/08/2013   PLT 221 11/08/2013   Lab Results  Component Value Date   NA 139 11/08/2013   K 4.2 11/08/2013   CL 101 09/06/2013   CO2 23 11/08/2013   Lab Results  Component Value Date   ALT 17 11/08/2013   AST 16 11/08/2013  ALKPHOS 134 11/08/2013   BILITOT <0.20 11/08/2013     NARRATIVE: [name] was seen today for weekly treatment management. The chart was checked and the patient's films were reviewed. The patient is doing reasonably well she states. He does request a refill for Xanax. The main complaint today is some increasing esophagitis. She has been using Nexium daily.  PHYSICAL EXAMINATION: weight is 177 lb 3.2 oz (80.377 kg). Her temperature is 98.2 F (36.8 C). Her blood pressure is 122/97 and her pulse is 72. Her respiration is 20 and oxygen saturation is 96%.        ASSESSMENT: The patient is doing satisfactorily with treatment.  PLAN: We will continue with the patient's radiation treatment as planned. She has been given a prescription for Carafate for use in addition to her current medications.

## 2013-11-12 NOTE — Progress Notes (Addendum)
Weekly rad txs 19/33 rt lung, pain in knees and back, patient denies any nausea, non prod cough, requesting refill on her xanax, fatigued, not using biafine as yet, no skin changes 11:59 AM

## 2013-11-15 ENCOUNTER — Ambulatory Visit
Admission: RE | Admit: 2013-11-15 | Discharge: 2013-11-15 | Disposition: A | Discharge status: Home / Self Care | Payer: Medicare Other | Source: Ambulatory Visit | Attending: Radiation Oncology | Admitting: Radiation Oncology

## 2013-11-15 ENCOUNTER — Ambulatory Visit: Payer: Medicare Other

## 2013-11-15 ENCOUNTER — Ambulatory Visit (HOSPITAL_BASED_OUTPATIENT_CLINIC_OR_DEPARTMENT_OTHER): Payer: Medicare Other

## 2013-11-15 ENCOUNTER — Ambulatory Visit (HOSPITAL_BASED_OUTPATIENT_CLINIC_OR_DEPARTMENT_OTHER): Payer: Medicare Other | Admitting: Lab

## 2013-11-15 ENCOUNTER — Telehealth: Payer: Self-pay | Admitting: Internal Medicine

## 2013-11-15 DIAGNOSIS — Z5111 Encounter for antineoplastic chemotherapy: Secondary | ICD-10-CM

## 2013-11-15 DIAGNOSIS — C3491 Malignant neoplasm of unspecified part of right bronchus or lung: Secondary | ICD-10-CM

## 2013-11-15 DIAGNOSIS — C3430 Malignant neoplasm of lower lobe, unspecified bronchus or lung: Secondary | ICD-10-CM

## 2013-11-15 LAB — COMPREHENSIVE METABOLIC PANEL (CC13)
ALT: 105 U/L — ABNORMAL HIGH (ref 0–55)
AST: 63 U/L — ABNORMAL HIGH (ref 5–34)
Albumin: 3 g/dL — ABNORMAL LOW (ref 3.5–5.0)
Alkaline Phosphatase: 177 U/L — ABNORMAL HIGH (ref 40–150)
BUN: 4.8 mg/dL — ABNORMAL LOW (ref 7.0–26.0)
Calcium: 8.7 mg/dL (ref 8.4–10.4)
Chloride: 104 mEq/L (ref 98–109)
Potassium: 3.6 mEq/L (ref 3.5–5.1)
Sodium: 138 mEq/L (ref 136–145)
Total Protein: 6.4 g/dL (ref 6.4–8.3)

## 2013-11-15 LAB — CBC WITH DIFFERENTIAL/PLATELET
BASO%: 0.7 % (ref 0.0–2.0)
Basophils Absolute: 0 10*3/uL (ref 0.0–0.1)
EOS%: 1.8 % (ref 0.0–7.0)
Eosinophils Absolute: 0.1 10*3/uL (ref 0.0–0.5)
HGB: 10.6 g/dL — ABNORMAL LOW (ref 11.6–15.9)
LYMPH%: 19.2 % (ref 14.0–49.7)
MCH: 30 pg (ref 25.1–34.0)
MCHC: 32.1 g/dL (ref 31.5–36.0)
MONO#: 0.2 10*3/uL (ref 0.1–0.9)
RDW: 14.9 % — ABNORMAL HIGH (ref 11.2–14.5)
WBC: 2.8 10*3/uL — ABNORMAL LOW (ref 3.9–10.3)
lymph#: 0.5 10*3/uL — ABNORMAL LOW (ref 0.9–3.3)
nRBC: 0 % (ref 0–0)

## 2013-11-15 MED ORDER — DIPHENHYDRAMINE HCL 50 MG/ML IJ SOLN
50.0000 mg | Freq: Once | INTRAMUSCULAR | Status: AC
  Administered 2013-11-15: 50 mg via INTRAVENOUS

## 2013-11-15 MED ORDER — FAMOTIDINE IN NACL 20-0.9 MG/50ML-% IV SOLN
20.0000 mg | Freq: Once | INTRAVENOUS | Status: AC
  Administered 2013-11-15: 20 mg via INTRAVENOUS

## 2013-11-15 MED ORDER — ONDANSETRON 16 MG/50ML IVPB (CHCC)
INTRAVENOUS | Status: AC
  Filled 2013-11-15: qty 16

## 2013-11-15 MED ORDER — FAMOTIDINE IN NACL 20-0.9 MG/50ML-% IV SOLN
INTRAVENOUS | Status: AC
  Filled 2013-11-15: qty 50

## 2013-11-15 MED ORDER — AMOXICILLIN-POT CLAVULANATE 875-125 MG PO TABS: 1.0000 | ORAL_TABLET | Freq: Two times a day (BID) | ORAL | Status: DC

## 2013-11-15 MED ORDER — ONDANSETRON 16 MG/50ML IVPB (CHCC)
16.0000 mg | Freq: Once | INTRAVENOUS | Status: AC
  Administered 2013-11-15: 16 mg via INTRAVENOUS

## 2013-11-15 MED ORDER — DIPHENHYDRAMINE HCL 50 MG/ML IJ SOLN
INTRAMUSCULAR | Status: AC
  Filled 2013-11-15: qty 1

## 2013-11-15 MED ORDER — PACLITAXEL CHEMO INJECTION 300 MG/50ML
45.0000 mg/m2 | Freq: Once | INTRAVENOUS | Status: AC
  Administered 2013-11-15: 84 mg via INTRAVENOUS
  Filled 2013-11-15: qty 14

## 2013-11-15 MED ORDER — SODIUM CHLORIDE 0.9 % IV SOLN
189.8000 mg | Freq: Once | INTRAVENOUS | Status: AC
  Administered 2013-11-15: 190 mg via INTRAVENOUS
  Filled 2013-11-15: qty 19

## 2013-11-15 MED ORDER — SODIUM CHLORIDE 0.9 % IV SOLN
Freq: Once | INTRAVENOUS | Status: AC
  Administered 2013-11-15: 13:00:00 via INTRAVENOUS

## 2013-11-15 MED ORDER — DEXAMETHASONE SODIUM PHOSPHATE 20 MG/5ML IJ SOLN
20.0000 mg | Freq: Once | INTRAMUSCULAR | Status: AC
  Administered 2013-11-15: 20 mg via INTRAVENOUS

## 2013-11-15 MED ORDER — DEXAMETHASONE SODIUM PHOSPHATE 20 MG/5ML IJ SOLN
INTRAMUSCULAR | Status: AC
  Filled 2013-11-15: qty 5

## 2013-11-15 NOTE — Telephone Encounter (Signed)
Augmentin 875 mg take one pill twice daily  X 10 days - take at breakfast and supper with large glass of water.  It would help reduce the usual side effects (diarrhea and yeast infections) if you ate cultured yogurt at lunch.  

## 2013-11-15 NOTE — Telephone Encounter (Signed)
Spoke to pt. Reports cough with production of yellow mucus and chest congestion, States that she is getting bronchitis again. Wants Augmentin refilled.  MW - please advise. Thanks.

## 2013-11-15 NOTE — Progress Notes (Signed)
OK to treat with wbc 2.8 and anc 1.9 per Dr Arbutus Ped

## 2013-11-15 NOTE — Telephone Encounter (Signed)
Pt is aware of recs. rx has been sent

## 2013-11-15 NOTE — Patient Instructions (Signed)
[facility] Discharge Instructions for Patients Receiving Chemotherapy  Today you received the following chemotherapy agents taxol, and carboplatin  To help prevent nausea and vomiting after your treatment, we encourage you to take your nausea medication if needed.   If you develop nausea and vomiting that is not controlled by your nausea medication, call the clinic.   BELOW ARE SYMPTOMS THAT SHOULD BE REPORTED IMMEDIATELY:  *FEVER GREATER THAN 100.5 F  *CHILLS WITH OR WITHOUT FEVER  NAUSEA AND VOMITING THAT IS NOT CONTROLLED WITH YOUR NAUSEA MEDICATION  *UNUSUAL SHORTNESS OF BREATH  *UNUSUAL BRUISING OR BLEEDING  TENDERNESS IN MOUTH AND THROAT WITH OR WITHOUT PRESENCE OF ULCERS  *URINARY PROBLEMS  *BOWEL PROBLEMS  UNUSUAL RASH Items with * indicate a potential emergency and should be followed up as soon as possible.  Feel free to call the clinic you have any questions or concerns. The clinic phone number is [phone].

## 2013-11-16 ENCOUNTER — Ambulatory Visit: Payer: Medicare Other

## 2013-11-16 ENCOUNTER — Ambulatory Visit
Admission: RE | Admit: 2013-11-16 | Discharge: 2013-11-16 | Disposition: A | Discharge status: Home / Self Care | Payer: Medicare Other | Source: Ambulatory Visit | Attending: Radiation Oncology | Admitting: Radiation Oncology

## 2013-11-17 ENCOUNTER — Ambulatory Visit
Admission: RE | Admit: 2013-11-17 | Discharge: 2013-11-17 | Disposition: A | Discharge status: Home / Self Care | Payer: Medicare Other | Source: Ambulatory Visit | Attending: Radiation Oncology | Admitting: Radiation Oncology

## 2013-11-17 ENCOUNTER — Ambulatory Visit: Payer: Medicare Other

## 2013-11-18 ENCOUNTER — Ambulatory Visit: Payer: Medicare Other

## 2013-11-18 ENCOUNTER — Ambulatory Visit
Admission: RE | Admit: 2013-11-18 | Discharge: 2013-11-18 | Disposition: A | Discharge status: Home / Self Care | Payer: Medicare Other | Source: Ambulatory Visit | Attending: Radiation Oncology | Admitting: Radiation Oncology

## 2013-11-19 ENCOUNTER — Ambulatory Visit
Admission: RE | Admit: 2013-11-19 | Discharge: 2013-11-19 | Disposition: A | Discharge status: Home / Self Care | Payer: Medicare Other | Source: Ambulatory Visit | Attending: Radiation Oncology | Admitting: Radiation Oncology

## 2013-11-19 ENCOUNTER — Ambulatory Visit: Payer: Medicare Other

## 2013-11-19 NOTE — Progress Notes (Signed)
[name] here for weekly under treat visit.  She has had 24 fractions to her right lung.  She is reporting pain in her lower back and bil knees and rating it at a 7/10.  She reports feeling very weak.  She reports being dizzy when she stands. She is in a wheelchair today.    Orthostatic vitals done:  bp sitting 115/55, hr 88, bp standing 102/60, hr 88.  She is taking atenolol.  She is also reporting having severe heartburn during the night.  She has stopped taking nexium and has not been able to purschase more.  She reports that her throat is sore when she swallows.  She reports a poor appetite.  She has lost 2 lbs since last week.  She is having nausea and had it this morning.  She denies having a cough.  The skin on her right chest is pink.  She is using biafine gel.

## 2013-11-19 NOTE — Progress Notes (Signed)
Department of Radiation Oncology  Phone:  [phone] Fax:        [phone]  Weekly Treatment Note    Name: [name] Date: 11/19/2013 MRN: [id number] DOB: [birth year]   Current dose: 48 Gy  Current fraction: 24   MEDICATIONS: Current Outpatient Prescriptions  Medication Sig Dispense Refill  . Aclidinium Bromide (TUDORZA PRESSAIR) 400 MCG/ACT AEPB Inhale 1 puff into the lungs 2 (two) times daily.      [name] albuterol (PROVENTIL) (2.5 MG/3ML) 0.083% nebulizer solution Take 2.5 mg by nebulization as needed for wheezing.      [name] albuterol (PROVENTIL) (5 MG/ML) 0.5% nebulizer solution Take 2.5 mg by nebulization every 6 (six) hours as needed for wheezing.      [name] albuterol (VENTOLIN HFA) 108 (90 BASE) MCG/ACT inhaler Inhale 2 puffs into the lungs every 6 (six) hours as needed for wheezing or shortness of breath.  1 Inhaler  11  . ALPRAZolam (XANAX) 1 MG tablet Take 2 tablets (2 mg total) by mouth 4 (four) times daily.  90 tablet  0  . amoxicillin-clavulanate (AUGMENTIN) 875-125 MG per tablet Take 1 tablet by mouth 2 (two) times daily.  20 tablet  0  . atenolol (TENORMIN) 25 MG tablet Take 25 mg by mouth daily.      . benzonatate (TESSALON) 200 MG capsule TAKE (1) CAPSULE THREE TIMES DAILY AS NEEDED FOR COUGH.  30 capsule  1  . chlorzoxazone (PARAFON) 500 MG tablet Take 500 mg by mouth 4 (four) times daily as needed for muscle spasms.      [name] dextromethorphan-guaiFENesin (MUCINEX DM) 30-600 MG per 12 hr tablet Take 1 tablet by mouth every 12 (twelve) hours.      . diphenhydrAMINE (BENADRYL) 25 mg capsule Take 25 mg by mouth 2 (two) times daily.       . diphenoxylate-atropine (LOMOTIL) 2.5-0.025 MG per tablet Take 2 tablets by mouth 4 (four) times daily as needed for diarrhea or loose stools.  40 tablet  0  . HYDROcodone-acetaminophen (NORCO) 10-325 MG per tablet Take 1 tablet by mouth every 4 (four) hours as needed.  90 tablet  0  . nicotine (NICODERM CQ - DOSED IN MG/24 HOURS) 14  mg/24hr patch Place 1 patch onto the skin daily.  28 patch  0  . prochlorperazine (COMPAZINE) 10 MG tablet Take 1 tablet (10 mg total) by mouth every 6 (six) hours as needed.  60 tablet  0  . sertraline (ZOLOFT) 100 MG tablet Take 200 mg by mouth daily.       . sucralfate (CARAFATE) 1 G tablet Take 1 tablet (1 g total) by mouth 4 (four) times daily.  120 tablet  2  . trazodone (DESYREL) 300 MG tablet Take 300 mg by mouth at bedtime.      [name] esomeprazole (NEXIUM) 40 MG capsule Take 40 mg by mouth daily before breakfast.       No current facility-administered medications for this encounter.     ALLERGIES: Prednisone; Nsaids; Aspirin; and Dulera   LABORATORY DATA:  Lab Results  Component Value Date   WBC 2.8* 11/15/2013   HGB 10.6* 11/15/2013   HCT 33.0* 11/15/2013   MCV 93.5 11/15/2013   PLT 139* 11/15/2013   Lab Results  Component Value Date   NA 138 11/15/2013   K 3.6 11/15/2013   CL 101 09/06/2013   CO2 26 11/15/2013   Lab Results  Component Value Date   ALT 105* 11/15/2013   AST 63* 11/15/2013  ALKPHOS 177* 11/15/2013   BILITOT 0.20 11/15/2013     NARRATIVE: [name] was seen today for weekly treatment management. The chart was checked and the patient's films were reviewed. The patient complains of some dizziness today. Her vital signs were fine. She is having esophagitis as her dominant complaints today.  PHYSICAL EXAMINATION: height is 5\' 6"  (1.676 m) and weight is 175 lb (79.379 kg). Her temperature is 97.9 F (36.6 C). Her blood pressure is 102/60 and her pulse is 95. Her oxygen saturation is 95%.        ASSESSMENT: The patient is doing satisfactorily with treatment.  PLAN: We will continue with the patient's radiation treatment as planned. The patient will continue her current medication for esophagitis including Carafate medication, Nexium, and pain medicine. We can increase her pain medicine if needed.

## 2013-11-21 ENCOUNTER — Ambulatory Visit
Admission: RE | Admit: 2013-11-21 | Discharge: 2013-11-21 | Disposition: A | Discharge status: Home / Self Care | Payer: Medicare Other | Source: Ambulatory Visit | Attending: Radiation Oncology | Admitting: Radiation Oncology

## 2013-11-22 ENCOUNTER — Other Ambulatory Visit (HOSPITAL_BASED_OUTPATIENT_CLINIC_OR_DEPARTMENT_OTHER): Payer: Medicare Other | Admitting: Lab

## 2013-11-22 ENCOUNTER — Ambulatory Visit
Admission: RE | Admit: 2013-11-22 | Discharge: 2013-11-22 | Disposition: A | Discharge status: Home / Self Care | Payer: Medicare Other | Source: Ambulatory Visit | Attending: Radiation Oncology | Admitting: Radiation Oncology

## 2013-11-22 ENCOUNTER — Ambulatory Visit: Admission: RE | Admit: 2013-11-22 | Payer: Medicare Other | Source: Ambulatory Visit | Admitting: Radiation Oncology

## 2013-11-22 ENCOUNTER — Telehealth: Payer: Self-pay | Admitting: *Deleted

## 2013-11-22 ENCOUNTER — Ambulatory Visit: Payer: Medicare Other

## 2013-11-22 ENCOUNTER — Encounter (HOSPITAL_COMMUNITY): Payer: Self-pay | Admitting: Internal Medicine

## 2013-11-22 ENCOUNTER — Emergency Department (HOSPITAL_COMMUNITY): Payer: Medicare Other

## 2013-11-22 ENCOUNTER — Ambulatory Visit (HOSPITAL_BASED_OUTPATIENT_CLINIC_OR_DEPARTMENT_OTHER): Payer: Medicare Other

## 2013-11-22 ENCOUNTER — Other Ambulatory Visit: Payer: Self-pay

## 2013-11-22 ENCOUNTER — Other Ambulatory Visit: Payer: Self-pay | Admitting: Internal Medicine

## 2013-11-22 ENCOUNTER — Inpatient Hospital Stay (HOSPITAL_COMMUNITY)
Admission: EM | Admit: 2013-11-22 | Discharge: 2013-11-25 | DRG: 193 | Disposition: A | Discharge status: Home / Self Care | Payer: Medicare Other | Attending: Family Medicine | Admitting: Family Medicine

## 2013-11-22 ENCOUNTER — Encounter: Payer: Self-pay | Admitting: Radiation Oncology

## 2013-11-22 DIAGNOSIS — F17200 Nicotine dependence, unspecified, uncomplicated: Secondary | ICD-10-CM

## 2013-11-22 DIAGNOSIS — Z886 Allergy status to analgesic agent status: Secondary | ICD-10-CM

## 2013-11-22 DIAGNOSIS — N950 Postmenopausal bleeding: Secondary | ICD-10-CM

## 2013-11-22 DIAGNOSIS — J449 Chronic obstructive pulmonary disease, unspecified: Secondary | ICD-10-CM

## 2013-11-22 DIAGNOSIS — F3289 Other specified depressive episodes: Secondary | ICD-10-CM | POA: Diagnosis present

## 2013-11-22 DIAGNOSIS — F419 Anxiety disorder, unspecified: Secondary | ICD-10-CM

## 2013-11-22 DIAGNOSIS — Z79899 Other long term (current) drug therapy: Secondary | ICD-10-CM

## 2013-11-22 DIAGNOSIS — C3430 Malignant neoplasm of lower lobe, unspecified bronchus or lung: Secondary | ICD-10-CM | POA: Diagnosis present

## 2013-11-22 DIAGNOSIS — Z923 Personal history of irradiation: Secondary | ICD-10-CM

## 2013-11-22 DIAGNOSIS — Z9221 Personal history of antineoplastic chemotherapy: Secondary | ICD-10-CM

## 2013-11-22 DIAGNOSIS — G9340 Encephalopathy, unspecified: Secondary | ICD-10-CM

## 2013-11-22 DIAGNOSIS — G8929 Other chronic pain: Secondary | ICD-10-CM

## 2013-11-22 DIAGNOSIS — Y95 Nosocomial condition: Secondary | ICD-10-CM

## 2013-11-22 DIAGNOSIS — R4182 Altered mental status, unspecified: Secondary | ICD-10-CM

## 2013-11-22 DIAGNOSIS — I10 Essential (primary) hypertension: Secondary | ICD-10-CM

## 2013-11-22 DIAGNOSIS — F4310 Post-traumatic stress disorder, unspecified: Secondary | ICD-10-CM | POA: Diagnosis present

## 2013-11-22 DIAGNOSIS — D071 Carcinoma in situ of vulva: Secondary | ICD-10-CM

## 2013-11-22 DIAGNOSIS — J189 Pneumonia, unspecified organism: Principal | ICD-10-CM

## 2013-11-22 DIAGNOSIS — R042 Hemoptysis: Secondary | ICD-10-CM

## 2013-11-22 DIAGNOSIS — S92309A Fracture of unspecified metatarsal bone(s), unspecified foot, initial encounter for closed fracture: Secondary | ICD-10-CM

## 2013-11-22 DIAGNOSIS — F32A Depression, unspecified: Secondary | ICD-10-CM

## 2013-11-22 DIAGNOSIS — C3490 Malignant neoplasm of unspecified part of unspecified bronchus or lung: Secondary | ICD-10-CM | POA: Diagnosis present

## 2013-11-22 DIAGNOSIS — J4489 Other specified chronic obstructive pulmonary disease: Secondary | ICD-10-CM | POA: Diagnosis present

## 2013-11-22 DIAGNOSIS — Z8249 Family history of ischemic heart disease and other diseases of the circulatory system: Secondary | ICD-10-CM

## 2013-11-22 DIAGNOSIS — Z8673 Personal history of transient ischemic attack (TIA), and cerebral infarction without residual deficits: Secondary | ICD-10-CM

## 2013-11-22 DIAGNOSIS — C3491 Malignant neoplasm of unspecified part of right bronchus or lung: Secondary | ICD-10-CM

## 2013-11-22 DIAGNOSIS — J984 Other disorders of lung: Secondary | ICD-10-CM

## 2013-11-22 DIAGNOSIS — M25559 Pain in unspecified hip: Secondary | ICD-10-CM | POA: Diagnosis present

## 2013-11-22 DIAGNOSIS — F411 Generalized anxiety disorder: Secondary | ICD-10-CM | POA: Diagnosis present

## 2013-11-22 DIAGNOSIS — F329 Major depressive disorder, single episode, unspecified: Secondary | ICD-10-CM

## 2013-11-22 DIAGNOSIS — Z5111 Encounter for antineoplastic chemotherapy: Secondary | ICD-10-CM

## 2013-11-22 DIAGNOSIS — Z833 Family history of diabetes mellitus: Secondary | ICD-10-CM

## 2013-11-22 DIAGNOSIS — R918 Other nonspecific abnormal finding of lung field: Secondary | ICD-10-CM

## 2013-11-22 DIAGNOSIS — S92209A Fracture of unspecified tarsal bone(s) of unspecified foot, initial encounter for closed fracture: Secondary | ICD-10-CM

## 2013-11-22 DIAGNOSIS — G9341 Metabolic encephalopathy: Secondary | ICD-10-CM | POA: Diagnosis present

## 2013-11-22 LAB — CBC
MCH: 30.7 pg (ref 26.0–34.0)
MCHC: 33.1 g/dL (ref 30.0–36.0)
MCV: 92.9 fL (ref 78.0–100.0)
Platelets: 147 10*3/uL — ABNORMAL LOW (ref 150–400)
RDW: 15.7 % — ABNORMAL HIGH (ref 11.5–15.5)
WBC: 3.2 10*3/uL — ABNORMAL LOW (ref 4.0–10.5)

## 2013-11-22 LAB — CBC WITH DIFFERENTIAL/PLATELET
Eosinophils Absolute: 0.1 10*3/uL (ref 0.0–0.5)
MONO#: 0.3 10*3/uL (ref 0.1–0.9)
NEUT#: 3.4 10*3/uL (ref 1.5–6.5)
RBC: 3.66 10*6/uL — ABNORMAL LOW (ref 3.70–5.45)
RDW: 16 % — ABNORMAL HIGH (ref 11.2–14.5)
WBC: 4.3 10*3/uL (ref 3.9–10.3)
lymph#: 0.6 10*3/uL — ABNORMAL LOW (ref 0.9–3.3)

## 2013-11-22 LAB — URINALYSIS, ROUTINE W REFLEX MICROSCOPIC
Hgb urine dipstick: NEGATIVE
Nitrite: NEGATIVE
Protein, ur: NEGATIVE mg/dL
Specific Gravity, Urine: 1.007 (ref 1.005–1.030)
Urobilinogen, UA: 0.2 mg/dL (ref 0.0–1.0)
pH: 7 (ref 5.0–8.0)

## 2013-11-22 LAB — BLOOD GAS, ARTERIAL
Bicarbonate: 26.2 mEq/L — ABNORMAL HIGH (ref 20.0–24.0)
Drawn by: 103701
FIO2: 0.21 %
O2 Saturation: 91.9 %
pCO2 arterial: 46.3 mmHg — ABNORMAL HIGH (ref 35.0–45.0)
pH, Arterial: 7.371 (ref 7.350–7.450)
pO2, Arterial: 66.8 mmHg — ABNORMAL LOW (ref 80.0–100.0)

## 2013-11-22 LAB — COMPREHENSIVE METABOLIC PANEL (CC13)
AST: 29 U/L (ref 5–34)
Albumin: 3 g/dL — ABNORMAL LOW (ref 3.5–5.0)
Anion Gap: 8 mEq/L (ref 3–11)
BUN: 6.2 mg/dL — ABNORMAL LOW (ref 7.0–26.0)
CO2: 27 mEq/L (ref 22–29)
Calcium: 8.7 mg/dL (ref 8.4–10.4)
Chloride: 102 mEq/L (ref 98–109)
Creatinine: 0.6 mg/dL (ref 0.6–1.1)
Glucose: 72 mg/dl (ref 70–140)
Potassium: 4 mEq/L (ref 3.5–5.1)

## 2013-11-22 LAB — MAGNESIUM: Magnesium: 1.7 mg/dL (ref 1.5–2.5)

## 2013-11-22 LAB — CG4 I-STAT (LACTIC ACID): Lactic Acid, Venous: 1.11 mmol/L (ref 0.5–2.2)

## 2013-11-22 LAB — CREATININE, SERUM: GFR calc non Af Amer: 88 mL/min — ABNORMAL LOW (ref >90)

## 2013-11-22 MED ORDER — VANCOMYCIN HCL IN DEXTROSE 1-5 GM/200ML-% IV SOLN
1000.0000 mg | Freq: Two times a day (BID) | INTRAVENOUS | Status: DC
  Administered 2013-11-23 – 2013-11-24 (×4): 1000 mg via INTRAVENOUS
  Filled 2013-11-22 (×4): qty 200

## 2013-11-22 MED ORDER — NICOTINE 14 MG/24HR TD PT24
14.0000 mg | MEDICATED_PATCH | Freq: Every day | TRANSDERMAL | Status: DC
  Administered 2013-11-23 – 2013-11-25 (×3): 14 mg via TRANSDERMAL
  Filled 2013-11-22 (×3): qty 1

## 2013-11-22 MED ORDER — ACETAMINOPHEN 325 MG PO TABS: 650.0000 mg | ORAL_TABLET | ORAL | Status: DC | PRN

## 2013-11-22 MED ORDER — PROCHLORPERAZINE MALEATE 10 MG PO TABS
10.0000 mg | ORAL_TABLET | Freq: Four times a day (QID) | ORAL | Status: DC | PRN
  Administered 2013-11-25: 10 mg via ORAL
  Filled 2013-11-22: qty 1

## 2013-11-22 MED ORDER — DIPHENHYDRAMINE HCL 50 MG/ML IJ SOLN
INTRAMUSCULAR | Status: AC
  Filled 2013-11-22: qty 1

## 2013-11-22 MED ORDER — SODIUM CHLORIDE 0.9 % IV SOLN
INTRAVENOUS | Status: AC
  Administered 2013-11-22 – 2013-11-23 (×3): via INTRAVENOUS

## 2013-11-22 MED ORDER — GUAIFENESIN ER 600 MG PO TB12
1200.0000 mg | ORAL_TABLET | Freq: Two times a day (BID) | ORAL | Status: DC
  Administered 2013-11-22 – 2013-11-25 (×6): 1200 mg via ORAL
  Filled 2013-11-22 (×7): qty 2

## 2013-11-22 MED ORDER — IPRATROPIUM BROMIDE 0.02 % IN SOLN
0.5000 mg | Freq: Four times a day (QID) | RESPIRATORY_TRACT | Status: DC
  Administered 2013-11-22: 0.5 mg via RESPIRATORY_TRACT
  Filled 2013-11-22: qty 2.5

## 2013-11-22 MED ORDER — ALBUTEROL SULFATE (5 MG/ML) 0.5% IN NEBU: 2.5000 mg | INHALATION_SOLUTION | RESPIRATORY_TRACT | Status: DC | PRN

## 2013-11-22 MED ORDER — DEXAMETHASONE SODIUM PHOSPHATE 20 MG/5ML IJ SOLN
INTRAMUSCULAR | Status: AC
  Filled 2013-11-22: qty 5

## 2013-11-22 MED ORDER — DEXTROSE 5 % IV SOLN
1.0000 g | INTRAVENOUS | Status: AC
  Administered 2013-11-22: 1 g via INTRAVENOUS
  Filled 2013-11-22: qty 1

## 2013-11-22 MED ORDER — ATENOLOL 25 MG PO TABS
25.0000 mg | ORAL_TABLET | Freq: Every day | ORAL | Status: DC
  Administered 2013-11-23: 25 mg via ORAL
  Filled 2013-11-22: qty 1

## 2013-11-22 MED ORDER — FAMOTIDINE IN NACL 20-0.9 MG/50ML-% IV SOLN
20.0000 mg | Freq: Once | INTRAVENOUS | Status: AC
  Administered 2013-11-22: 20 mg via INTRAVENOUS

## 2013-11-22 MED ORDER — IPRATROPIUM BROMIDE 0.02 % IN SOLN
0.2500 mg | RESPIRATORY_TRACT | Status: DC | PRN
  Administered 2013-11-22: 18:00:00 via RESPIRATORY_TRACT
  Filled 2013-11-22: qty 2.5

## 2013-11-22 MED ORDER — OXYCODONE HCL 5 MG PO TABS
5.0000 mg | ORAL_TABLET | ORAL | Status: DC | PRN
  Administered 2013-11-22 – 2013-11-24 (×7): 5 mg via ORAL
  Filled 2013-11-22 (×7): qty 1

## 2013-11-22 MED ORDER — SERTRALINE HCL 100 MG PO TABS
200.0000 mg | ORAL_TABLET | Freq: Every day | ORAL | Status: DC
  Administered 2013-11-23 – 2013-11-25 (×3): 200 mg via ORAL
  Filled 2013-11-22 (×3): qty 2

## 2013-11-22 MED ORDER — ALBUTEROL (5 MG/ML) CONTINUOUS INHALATION SOLN
10.0000 mg/h | INHALATION_SOLUTION | Freq: Once | RESPIRATORY_TRACT | Status: DC
  Filled 2013-11-22: qty 20

## 2013-11-22 MED ORDER — ONDANSETRON HCL 4 MG/2ML IJ SOLN
4.0000 mg | Freq: Four times a day (QID) | INTRAMUSCULAR | Status: DC | PRN
Start: 2013-11-22 — End: 2013-11-25
  Administered 2013-11-24 – 2013-11-25 (×2): 4 mg via INTRAVENOUS
  Filled 2013-11-22 (×2): qty 2

## 2013-11-22 MED ORDER — DEXTROSE 5 % IV SOLN: 1.0000 g | Freq: Three times a day (TID) | INTRAVENOUS | Status: DC

## 2013-11-22 MED ORDER — VITAMINS A & D EX OINT
TOPICAL_OINTMENT | CUTANEOUS | Status: AC
  Filled 2013-11-22: qty 5

## 2013-11-22 MED ORDER — DIPHENHYDRAMINE HCL 50 MG/ML IJ SOLN
50.0000 mg | Freq: Once | INTRAMUSCULAR | Status: AC
  Administered 2013-11-22: 50 mg via INTRAVENOUS

## 2013-11-22 MED ORDER — DEXTROSE 5 % IV SOLN
1.0000 g | Freq: Three times a day (TID) | INTRAVENOUS | Status: DC
  Administered 2013-11-22 – 2013-11-24 (×6): 1 g via INTRAVENOUS
  Filled 2013-11-22 (×7): qty 1

## 2013-11-22 MED ORDER — SODIUM CHLORIDE 0.9 % IV BOLUS (SEPSIS)
500.0000 mL | Freq: Once | INTRAVENOUS | Status: AC
  Administered 2013-11-22: 500 mL via INTRAVENOUS

## 2013-11-22 MED ORDER — SODIUM CHLORIDE 0.9 % IV SOLN
Freq: Once | INTRAVENOUS | Status: AC
  Administered 2013-11-22: 13:00:00 via INTRAVENOUS

## 2013-11-22 MED ORDER — TRAZODONE HCL 150 MG PO TABS
300.0000 mg | ORAL_TABLET | Freq: Every day | ORAL | Status: DC
  Administered 2013-11-22 – 2013-11-24 (×3): 300 mg via ORAL
  Filled 2013-11-22 (×4): qty 2

## 2013-11-22 MED ORDER — ALPRAZOLAM 1 MG PO TABS
1.0000 mg | ORAL_TABLET | Freq: Four times a day (QID) | ORAL | Status: DC | PRN
  Administered 2013-11-22 – 2013-11-25 (×10): 1 mg via ORAL
  Filled 2013-11-22 (×10): qty 1

## 2013-11-22 MED ORDER — ALBUTEROL SULFATE (5 MG/ML) 0.5% IN NEBU
2.5000 mg | INHALATION_SOLUTION | Freq: Four times a day (QID) | RESPIRATORY_TRACT | Status: DC
  Administered 2013-11-22: 2.5 mg via RESPIRATORY_TRACT
  Filled 2013-11-22: qty 0.5

## 2013-11-22 MED ORDER — FAMOTIDINE IN NACL 20-0.9 MG/50ML-% IV SOLN
INTRAVENOUS | Status: AC
  Filled 2013-11-22: qty 50

## 2013-11-22 MED ORDER — PACLITAXEL CHEMO INJECTION 300 MG/50ML
45.0000 mg/m2 | Freq: Once | INTRAVENOUS | Status: AC
  Administered 2013-11-22: 84 mg via INTRAVENOUS
  Filled 2013-11-22: qty 14

## 2013-11-22 MED ORDER — SUCRALFATE 1 G PO TABS
1.0000 g | ORAL_TABLET | Freq: Four times a day (QID) | ORAL | Status: DC
  Administered 2013-11-22 – 2013-11-25 (×11): 1 g via ORAL
  Filled 2013-11-22 (×13): qty 1

## 2013-11-22 MED ORDER — IPRATROPIUM BROMIDE 0.02 % IN SOLN: 0.5000 mg | RESPIRATORY_TRACT | Status: DC | PRN

## 2013-11-22 MED ORDER — ALBUTEROL SULFATE (5 MG/ML) 0.5% IN NEBU
5.0000 mg | INHALATION_SOLUTION | RESPIRATORY_TRACT | Status: DC | PRN
  Administered 2013-11-22: 5 mg via RESPIRATORY_TRACT
  Filled 2013-11-22: qty 1

## 2013-11-22 MED ORDER — ONDANSETRON 16 MG/50ML IVPB (CHCC)
INTRAVENOUS | Status: AC
  Filled 2013-11-22: qty 16

## 2013-11-22 MED ORDER — ONDANSETRON 16 MG/50ML IVPB (CHCC)
16.0000 mg | Freq: Once | INTRAVENOUS | Status: AC
  Administered 2013-11-22: 16 mg via INTRAVENOUS

## 2013-11-22 MED ORDER — HYDRALAZINE HCL 20 MG/ML IJ SOLN
10.0000 mg | Freq: Once | INTRAMUSCULAR | Status: AC
  Administered 2013-11-22: 10 mg via INTRAVENOUS
  Filled 2013-11-22: qty 1
  Filled 2013-11-22: qty 0.5

## 2013-11-22 MED ORDER — SODIUM CHLORIDE 0.9 % IV SOLN
189.8000 mg | Freq: Once | INTRAVENOUS | Status: AC
  Administered 2013-11-22: 190 mg via INTRAVENOUS
  Filled 2013-11-22: qty 19

## 2013-11-22 MED ORDER — PANTOPRAZOLE SODIUM 40 MG PO TBEC
80.0000 mg | DELAYED_RELEASE_TABLET | Freq: Every day | ORAL | Status: DC
  Administered 2013-11-23 – 2013-11-25 (×3): 80 mg via ORAL
  Filled 2013-11-22 (×4): qty 2

## 2013-11-22 MED ORDER — DEXAMETHASONE SODIUM PHOSPHATE 20 MG/5ML IJ SOLN
20.0000 mg | Freq: Once | INTRAMUSCULAR | Status: AC
  Administered 2013-11-22: 20 mg via INTRAVENOUS

## 2013-11-22 MED ORDER — VANCOMYCIN HCL 10 G IV SOLR
1500.0000 mg | Freq: Once | INTRAVENOUS | Status: AC
  Administered 2013-11-22: 1500 mg via INTRAVENOUS
  Filled 2013-11-22: qty 1500

## 2013-11-22 MED ORDER — ENOXAPARIN SODIUM 40 MG/0.4ML ~~LOC~~ SOLN
40.0000 mg | SUBCUTANEOUS | Status: DC
  Filled 2013-11-22 (×4): qty 0.4

## 2013-11-22 NOTE — Patient Instructions (Signed)
[facility] Discharge Instructions for Patients Receiving Chemotherapy  Today you received the following chemotherapy agents taxol/Carboplatin.  To help prevent nausea and vomiting after your treatment, we encourage you to take your nausea medication as prescribed.   If you develop nausea and vomiting that is not controlled by your nausea medication, call the clinic.   BELOW ARE SYMPTOMS THAT SHOULD BE REPORTED IMMEDIATELY:  *FEVER GREATER THAN 100.5 F  *CHILLS WITH OR WITHOUT FEVER  NAUSEA AND VOMITING THAT IS NOT CONTROLLED WITH YOUR NAUSEA MEDICATION  *UNUSUAL SHORTNESS OF BREATH  *UNUSUAL BRUISING OR BLEEDING  TENDERNESS IN MOUTH AND THROAT WITH OR WITHOUT PRESENCE OF ULCERS  *URINARY PROBLEMS  *BOWEL PROBLEMS  UNUSUAL RASH Items with * indicate a potential emergency and should be followed up as soon as possible.  Feel free to call the clinic you have any questions or concerns. The clinic phone number is [phone].    

## 2013-11-22 NOTE — H&P (Signed)
[facility] History and Physical  [name] ZOX:[id number] DOB: [birth year] DOA: 11/22/2013  Referring physician: Dr. Elesa Massed PCP: [name], MD  Specialists: Oncology: Dr. Shirline Frees  Chief Complaint: Confusion  HPI: [name] is a 67 y.o. female  With history of hypertension, history of CVA x3, anxiety, PTSD, depression, COPD, locally advanced likely stage IIIa non-small cell lung cancer, squamous cell carcinoma diagnosed in September of 2014 concurrently on chemoradiation with weekly carboplatin for AUC of 2 and paclitaxel 45mg /M2, status post 3 cycles who presents to the ED with confusion after receiving chemotherapy and radiation. Patient is somewhat confused and a such history was obtained from the son and from the ED physician. The son reports mother was going well prior to treatments at approximately 1:30 PM. After treatment on the way home patient was noted to be having hallucinations and talking to her grandmother and great-grandmother who are deceased for many years. Son also states patient was talking about her granddaughter being pregnant although she is only 25. It also stated that patient was also fallen asleep easily in the car. Patient does not remember these events. Patient denies any fevers, no chills. Patient does endorse a productive cough of yellowish sputum with some occasional red streaks that have been ongoing for several months. Patient also with some occasional shortness of breath which has since improved. Patient endorses weakness and decreased energy. Patient does endorse some nausea some vomiting some abdominal pain last week which has since resolved as well as some diarrhea which has since resolved. Patient denies any dysuria. Patient was seen in the emergency room chest x-ray which was done was worrisome for a right lower lobe pneumonia. Urinalysis which was done was pending. Lab work obtained in the cancer center prior to admission had a comprehensive  metabolic profile with a BUN of 6.2 alk phosphatase of 196 albumin of 3 ALT of 107 otherwise was within normal limits. CBC done at the cancer Center had a white count of 4.3 hemoglobin of 11.0 with a left shift with 77.8% neutrophils. Lactic acid level was 1.11. EKG done showed normal sinus rhythm. CT head was negative. Patient was given a dose of IV vancomycin IV cefepime. We were consulted to admit the patient for further evaluation and management.  Review of Systems: The patient denies anorexia, fever, weight loss,, vision loss, decreased hearing, hoarseness, chest pain, syncope, dyspnea on exertion, peripheral edema, balance deficits, hemoptysis, abdominal pain, melena, hematochezia, severe indigestion/heartburn, hematuria, incontinence, genital sores, muscle weakness, suspicious skin lesions, transient blindness, difficulty walking, depression, unusual weight change, abnormal bleeding, enlarged lymph nodes, angioedema, and breast masses.   Past Medical History  Diagnosis Date  . H/O: stroke 2012, 2000,     X3  . Hypertension   . Anxiety   . PTSD (post-traumatic stress disorder)   . Depression   . SVD (spontaneous vaginal delivery)     x 1  . Smoker   . Osteoarthritis     knees, hips hands,   . Shortness of breath   . Pain     SEVERE PAIN BACK, RIGHT HIP AND KNEES--HARRINGTON RODS AND CERVICAL PLATES--USES WALKER OR CANE WHEN AMBULATING - GOES TO A PAIN CLINIC-STATES SHE NEEDS RT HIP AND BOTH KNEES REPLACED. PT STATES SHE WAS TOLD THE CEMENT AROUND THE HARRINGTON RODS IS CRACKED.  [name] Hot flashes, menopausal     SEVERE  . Family history of anesthesia complication     equipment failure caused problem  . Complication of anesthesia 03/02/13  severe headache,vertigo postop  . COPD (chronic obstructive pulmonary disease)     stable  . 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 Lung cancer    Past Surgical History  Procedure Laterality Date  . Carpal tunnel release      BILATERAL  . Nasal  sinus surgery    . Lumbar fusion      L4-S1  . Cervical fusion    . Hernia repair    . Salpingoophorectomy Right   . Vaginal hysterectomy      TAH w/ ovary removal  . Transthoracic echocardiogram  12-03-2011    LVSF NORMAL/ EF 60-65%  . Vulvectomy N/A 03/02/2013    Procedure: WIDE LOCAL EXCISION VULVAR;  Surgeon: Rejeana Brock A. Duard Brady, MD;  Location: [facility];  Service: Gynecology;  Laterality: N/A;  . Co2 laser application N/A 05/13/2013    Procedure: LASER APPLICATION OF THE VULVA;  Surgeon: Laurette Schimke, MD;  Location: [facility];  Service: Gynecology;  Laterality: N/A;  . Video bronchoscopy Bilateral 09/23/2013    Procedure: VIDEO BRONCHOSCOPY WITHOUT FLUORO;  Surgeon: Nyoka Cowden, MD;  Location: [facility];  Service: Cardiopulmonary;  Laterality: Bilateral;   Social History:  reports that she has been smoking Cigarettes.  She has a 17.5 pack-year smoking history. She has never used smokeless tobacco. She reports that she does not drink alcohol or use illicit drugs.   Allergies  Allergen Reactions  . Prednisone     insomnia  . Nsaids     GI Upset  . Aspirin Other (See Comments)    Gi symptoms. Does NOT take ibuprofen or other NSAIDS  . Dulera [Mometasone Furo-Formoterol Fum] Palpitations    Panting " my body was twisted inside and out"    Family History  Problem Relation Age of Onset  . Cancer Mother     COLON  . Heart disease Father   . Diabetes Maternal Aunt   . Cancer Maternal Grandfather     KIDNEY   . Diabetes Maternal Grandfather   . Hypertension Maternal Grandfather   . Heart disease Maternal Grandfather     Prior to Admission medications   Medication Sig Start Date End Date Taking? Authorizing Provider  albuterol (PROVENTIL) (2.5 MG/3ML) 0.083% nebulizer solution Take 2.5 mg by nebulization as needed for wheezing.   Yes Historical Provider, MD  albuterol (VENTOLIN HFA) 108 (90 BASE) MCG/ACT inhaler Inhale 2 puffs into the lungs every 6 (six)  hours as needed for wheezing or shortness of breath. 11/03/13  Yes Nyoka Cowden, MD  ALPRAZolam Prudy Feeler) 1 MG tablet Take 2 tablets (2 mg total) by mouth 4 (four) times daily. 11/12/13  Yes Jonna Coup, MD  amoxicillin-clavulanate (AUGMENTIN) 875-125 MG per tablet Take 1 tablet by mouth 2 (two) times daily. 11/15/13  Yes Nyoka Cowden, MD  atenolol (TENORMIN) 25 MG tablet Take 25 mg by mouth daily.   Yes Historical Provider, MD  benzonatate (TESSALON) 200 MG capsule Take 200 mg by mouth 2 (two) times daily as needed for cough.   Yes Historical Provider, MD  chlorzoxazone (PARAFON) 500 MG tablet Take 500 mg by mouth 4 (four) times daily as needed for muscle spasms.   Yes Historical Provider, MD  dextromethorphan-guaiFENesin (MUCINEX DM) 30-600 MG per 12 hr tablet Take 1 tablet by mouth every 12 (twelve) hours.   Yes Historical Provider, MD  diphenhydrAMINE (BENADRYL) 25 mg capsule Take 25 mg by mouth 2 (two) times daily.    Yes Historical  Provider, MD  esomeprazole (NEXIUM) 40 MG capsule Take 40 mg by mouth daily before breakfast.   Yes Historical Provider, MD  HYDROcodone-acetaminophen (NORCO) 10-325 MG per tablet Take 1 tablet by mouth every 4 (four) hours as needed. 11/08/13  Yes Billie Lade, MD  ibuprofen (ADVIL,MOTRIN) 200 MG tablet Take 400 mg by mouth every 6 (six) hours as needed.   Yes Historical Provider, MD  nicotine (NICODERM CQ - DOSED IN MG/24 HOURS) 14 mg/24hr patch Place 1 patch onto the skin daily. 09/06/13  Yes Lesle Chris Black, NP  prochlorperazine (COMPAZINE) 10 MG tablet Take 10 mg by mouth every 6 (six) hours as needed for nausea. 10/07/13  Yes Si Gaul, MD  sertraline (ZOLOFT) 100 MG tablet Take 200 mg by mouth daily.    Yes Historical Provider, MD  sucralfate (CARAFATE) 1 G tablet Take 1 tablet (1 g total) by mouth 4 (four) times daily. 11/12/13  Yes Jonna Coup, MD  trazodone (DESYREL) 300 MG tablet Take 300 mg by mouth at bedtime.   Yes Historical Provider, MD    Physical Exam: Filed Vitals:   11/22/13 1627  BP: 152/75  Pulse: 87  Temp: 98.4 F (36.9 C)  Resp: 20     General:  Elderly female in no acute cardiopulmonary distress. Speaking in full sentences.  Eyes: Pupils equal round reactive to light and accommodation. Extraocular movements intact.  ENT: Oropharynx is clear, no lesions, no exudates. Dry mucous membranes.  Neck: Supple with no lymphadenopathy. No JVD.  Cardiovascular: Regular rate rhythm no murmurs rubs or gallops. No lower extremity edema.  Respiratory: Coarse diffuse rhonchorous breath sounds with some mild expiratory wheezing. No crackles.  Abdomen: Soft, nontender, nondistended, positive bowel sounds.  Skin: No rashes or lesions.  Musculoskeletal: 4/5 bilateral lower extremity strength, 4/5 bilateral upper extremity strength.  Psychiatric: Normal mood. Normal affect. Poor insight. Poor judgment.  Neurologic: Alert. Somewhat confused. Cranial nerves II through XII are grossly intact. No focal deficits. Sensation is intact. Visual fields are intact. Gait not tested secondary to safety.  Labs on Admission:  Basic Metabolic Panel:  Recent Labs Lab 11/22/13 1224  NA 138  K 4.0  CO2 27  GLUCOSE 72  BUN 6.2*  CREATININE 0.6  CALCIUM 8.7   Liver Function Tests:  Recent Labs Lab 11/22/13 1224  AST 29  ALT 107*  ALKPHOS 196*  BILITOT 0.38  PROT 6.5  ALBUMIN 3.0*   No results found for this basename: LIPASE, AMYLASE,  in the last 168 hours No results found for this basename: AMMONIA,  in the last 168 hours CBC:  Recent Labs Lab 11/22/13 1223  WBC 4.3  NEUTROABS 3.4  HGB 11.0*  HCT 34.1*  MCV 93.2  PLT 133*   Cardiac Enzymes: No results found for this basename: CKTOTAL, CKMB, CKMBINDEX, TROPONINI,  in the last 168 hours  BNP (last 3 results) No results found for this basename: PROBNP,  in the last 8760 hours CBG: No results found for this basename: GLUCAP,  in the last 168  hours  Radiological Exams on Admission: Dg Chest 2 View  11/22/2013   CLINICAL DATA:  Cough, productive sputum  EXAM: CHEST  2 VIEW  COMPARISON:  09/04/2013  FINDINGS: Cardiomediastinal silhouette is stable. There is hazy airspace is in right lower lobe medially highly suspicious for pneumonia. Follow-up to resolution after appropriate treatment is recommended. Osteopenia and mild degenerative changes lower thoracic spine. Again noted poorly visualized mass in right lower lobe.  IMPRESSION:  Hazy airspace is in right lower lobe medially highly suspicious for pneumonia. Follow-up to resolution after appropriate treatment is recommended. Poorly visualized mass in right lower lobe again noted.   Electronically Signed   By: Natasha Mead M.D.   On: 11/22/2013 17:08   Ct Head Wo Contrast  11/22/2013   CLINICAL DATA:  Altered mental status, history of lung cancer  EXAM: CT HEAD WITHOUT CONTRAST  TECHNIQUE: Contiguous axial images were obtained from the base of the skull through the vertex without intravenous contrast.  COMPARISON:  10/07/2013  FINDINGS: No skull fracture is noted. Paranasal sinuses and mastoid air cells are unremarkable.  No intracranial hemorrhage, mass effect or midline shift. Ventricular size is stable from prior exam. No acute infarction. No mass lesion is noted on this unenhanced scan. Stable mild cerebral atrophy.  IMPRESSION: No acute intracranial abnormality.  Stable mild cerebral atrophy.   Electronically Signed   By: Natasha Mead M.D.   On: 11/22/2013 17:11    EKG: Independently reviewed. Normal sinus rhythm.  Assessment/Plan Principal Problem:   Acute encephalopathy Active Problems:   Hospital acquired PNA   HTN (hypertension)   Depression   Anxiety   COPD GOLD III   Arthralgia of hip   Chronic pain   Right lower lobe lung mass, Sq cell Ca ? IIIB vs IV   Lung cancer   Malignant neoplasm of lower lobe, bronchus, or lung   #1 acute encephalopathy Likely metabolic  encephalopathy secondary to right lower lobe pneumonia. CT of the head is negative. Patient with no focal neurological deficits. Patient with no signs or symptoms of seizures. Will check a UA with cultures and sensitivities. Check a sputum Gram stain and culture. Check a urine Legionella. Check a urine pneumococcus antigen. Placed empirically on oxygen, IV vancomycin, IV cefepime, nebulizer treatments. Will decrease patient's home dose of Xanax to half home dose. Will also decrease patient's pain medication to have the home dose. Follow.  #2 probable healthcare associated pneumonia Patient is a 67 year old lady with recently diagnosed lung cancer receiving, current chemoradiation presenting with altered mental status and a productive cough with rhonchorous breath sounds on lung exam and chest x-ray consistent with a right lower lobe pneumonia. Will admit patient to MedSurg floor. Check a sputum Gram stain and culture. Check a urine Legionella antigen. Check a urine pneumococcus antigen. Placed empirically on oxygen, Mucinex, IV vancomycin, IV cefepime, nebulizer treatments. Follow.  #3 COPD Stable. We'll place on scheduled meds. Patient on IV antibiotics secondary to problem #2. Follow.  #4 anxiety We'll decrease home dose Xanax to have a home regimen and place as needed secondary to problem #1.  #5 chronic pain Will place on 5 mg of oxycodone as needed.  #6 hypertension Resume patient's home dose of atenolol. Hydralazine as needed. May need to up titrate atenolol and may need to add a second agent if blood pressure still elevated.  #7 locally advanced likely stage IIIa non-small cell lung cancer, squamous cell lung cancer Patient receiving concurrent chemoradiation. Will inform oncology of patient's admission in the morning. Radiation oncology will also need to be informed in the morning.  #8 depression Stable. Continue home regimen of Zoloft.  #9 prophylaxis PPI for GI prophylaxis. Lovenox  for DVT prophylaxis.   Code Status: Full Family Communication: Updated patient and son at bedside. Disposition Plan: Admit to MedSurg  Time spent: 70 minutes  THOMPSON,DANIEL M.D. [facility] Pager [phone]  If 7PM-7AM, please contact night-coverage [email or web address] Password [facility] 11/22/2013, 6:24  PM

## 2013-11-22 NOTE — ED Provider Notes (Signed)
TIME SEEN: 4:22 PM  CHIEF COMPLAINT: Altered mental status  HPI: Patient is a 67 year old female with history of prior CVA, hypertension, right lower lobe carcinoma currently receiving chemotherapy and radiation (last each was today) who presents to the emergency department with her son with concerns for altered mental status. Patient's son reports patient was doing well prior to her treatments today at approximately 1:30 PM. After her treatments patient's son reports she was having hallucinations. She was talking to her grandmother and her daughter-in-law that have both been deceased for many years. Son reports she is also falling asleep easily in a car. Patient reports that she does not remember these events. She denies any headache or neck pain, neck stiffness. No chest pain or shortness of breath. No abdominal pain. She has had nausea and vomiting, last was 3 days ago. No diarrhea. No dysuria or hematuria. No numbness, tingling or focal weakness. No history of anticoagulation use or head injury. No fever or chills. She has recently had cough with yellow sputum production. She was started on Augmentin for possible bronchitis on 11/15/13. She takes Norco and Xanax and reports taking both of these medications today but has not taken more than her prescribed amount.  ROS: See HPI Constitutional: no fever  Eyes: no drainage  ENT: no runny nose   Cardiovascular:  no chest pain  Resp: no SOB  GI: no vomiting GU: no dysuria Integumentary: no rash  Allergy: no hives  Musculoskeletal: no leg swelling  Neurological: no slurred speech ROS otherwise negative  PAST MEDICAL HISTORY/PAST SURGICAL HISTORY:  Past Medical History  Diagnosis Date  . H/O: stroke 2012, 2000,     X3  . Hypertension   . Anxiety   . PTSD (post-traumatic stress disorder)   . Depression   . SVD (spontaneous vaginal delivery)     x 1  . Smoker   . Osteoarthritis     knees, hips hands,   . Shortness of breath   . Pain    SEVERE PAIN BACK, RIGHT HIP AND KNEES--HARRINGTON RODS AND CERVICAL PLATES--USES WALKER OR CANE WHEN AMBULATING - GOES TO A PAIN CLINIC-STATES SHE NEEDS RT HIP AND BOTH KNEES REPLACED. PT STATES SHE WAS TOLD THE CEMENT AROUND THE HARRINGTON RODS IS CRACKED.  [name] Hot flashes, menopausal     SEVERE  . Family history of anesthesia complication     equipment failure caused problem  . Complication of anesthesia 03/02/13    severe headache,vertigo postop  . COPD (chronic obstructive pulmonary disease)     stable  . 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 Lung cancer     MEDICATIONS:  Prior to Admission medications   Medication Sig Start Date End Date Taking? Authorizing Provider  Aclidinium Bromide (TUDORZA PRESSAIR) 400 MCG/ACT AEPB Inhale 1 puff into the lungs 2 (two) times daily.    Historical Provider, MD  albuterol (PROVENTIL) (2.5 MG/3ML) 0.083% nebulizer solution Take 2.5 mg by nebulization as needed for wheezing.    Historical Provider, MD  albuterol (PROVENTIL) (5 MG/ML) 0.5% nebulizer solution Take 2.5 mg by nebulization every 6 (six) hours as needed for wheezing.    Historical Provider, MD  albuterol (VENTOLIN HFA) 108 (90 BASE) MCG/ACT inhaler Inhale 2 puffs into the lungs every 6 (six) hours as needed for wheezing or shortness of breath. 11/03/13   Nyoka Cowden, MD  ALPRAZolam Prudy Feeler) 1 MG tablet Take 2 tablets (2 mg total) by mouth 4 (four) times daily. 11/12/13  Jonna Coup, MD  amoxicillin-clavulanate (AUGMENTIN) 875-125 MG per tablet Take 1 tablet by mouth 2 (two) times daily. 11/15/13   Nyoka Cowden, MD  atenolol (TENORMIN) 25 MG tablet Take 25 mg by mouth daily.    Historical Provider, MD  benzonatate (TESSALON) 200 MG capsule TAKE (1) CAPSULE THREE TIMES DAILY AS NEEDED FOR COUGH. 11/02/13   Nyoka Cowden, MD  chlorzoxazone (PARAFON) 500 MG tablet Take 500 mg by mouth 4 (four) times daily as needed for muscle spasms.    Historical Provider, MD  dextromethorphan-guaiFENesin  (MUCINEX DM) 30-600 MG per 12 hr tablet Take 1 tablet by mouth every 12 (twelve) hours.    Historical Provider, MD  diphenhydrAMINE (BENADRYL) 25 mg capsule Take 25 mg by mouth 2 (two) times daily.     Historical Provider, MD  diphenoxylate-atropine (LOMOTIL) 2.5-0.025 MG per tablet Take 2 tablets by mouth 4 (four) times daily as needed for diarrhea or loose stools. 10/18/13   Jonna Coup, MD  esomeprazole (NEXIUM) 40 MG capsule Take 40 mg by mouth daily before breakfast.    Historical Provider, MD  HYDROcodone-acetaminophen (NORCO) 10-325 MG per tablet Take 1 tablet by mouth every 4 (four) hours as needed. 11/08/13   Billie Lade, MD  nicotine (NICODERM CQ - DOSED IN MG/24 HOURS) 14 mg/24hr patch Place 1 patch onto the skin daily. 09/06/13   Gwenyth Bender, NP  prochlorperazine (COMPAZINE) 10 MG tablet Take 1 tablet (10 mg total) by mouth every 6 (six) hours as needed. 10/07/13   Si Gaul, MD  sertraline (ZOLOFT) 100 MG tablet Take 200 mg by mouth daily.     Historical Provider, MD  sucralfate (CARAFATE) 1 G tablet Take 1 tablet (1 g total) by mouth 4 (four) times daily. 11/12/13   Jonna Coup, MD  trazodone (DESYREL) 300 MG tablet Take 300 mg by mouth at bedtime.    Historical Provider, MD    ALLERGIES:  Allergies  Allergen Reactions  . Prednisone     insomnia  . Nsaids     GI Upset  . Aspirin Other (See Comments)    Gi symptoms. Does NOT take ibuprofen or other NSAIDS  . Dulera [Mometasone Furo-Formoterol Fum] Palpitations    Panting " my body was twisted inside and out"    SOCIAL HISTORY:  History  Substance Use Topics  . Smoking status: Current Some Day Smoker -- 0.50 packs/day for 35 years    Types: Cigarettes    Last Attempt to Quit: 09/13/2013  . Smokeless tobacco: Never Used     Comment: 4cigs per day 02/17/13  . Alcohol Use: No    FAMILY HISTORY: Family History  Problem Relation Age of Onset  . Cancer Mother     COLON  . Heart disease Father   .  Diabetes Maternal Aunt   . Cancer Maternal Grandfather     KIDNEY   . Diabetes Maternal Grandfather   . Hypertension Maternal Grandfather   . Heart disease Maternal Grandfather     EXAM: There were no vitals taken for this visit. CONSTITUTIONAL: Alert and oriented x 4 and responds appropriately to questions. Well-appearing; well-nourished HEAD: Normocephalic EYES: Conjunctivae clear, PERRL; pupils are 3 mm bilaterally ENT: normal nose; no rhinorrhea; moist mucous membranes; pharynx without lesions noted NECK: Supple, no meningismus, no LAD  CARD: RRR; S1 and S2 appreciated; no murmurs, no clicks, no rubs, no gallops RESP: Normal chest excursion without splinting or tachypnea; breath sounds equal bilaterally, diffuse  expiratory wheezing, rhonchi bases bilaterally, no rales ABD/GI: Normal bowel sounds; non-distended; soft, non-tender, no rebound, no guarding BACK:  The back appears normal and is non-tender to palpation, there is no CVA tenderness EXT: Normal ROM in all joints; non-tender to palpation; no edema; normal capillary refill; no cyanosis    SKIN: Normal color for age and race; warm NEURO: Moves all extremities equally; cranial nerves II through XII intact, sensation to light touch intact diffusely, no drift PSYCH: The patient's mood and manner are appropriate. Grooming and personal hygiene are appropriate.  MEDICAL DECISION MAKING: Patient here with altered mental status. Labs drawn earlier today show predominant neutrophils but no leukocytosis. She does also have mildly elevated LFTs which have been present in the past. No other abnormality. We'll obtain chest x-ray, urinalysis to evaluate for possible infection. We'll also obtain head CT. Will give IV fluids and closely monitor.  ED PROGRESS: Chest x-ray shows a right middle lobe pneumonia. Given patient's immunocompromised and has been on Augmentin for 7 days without relief of symptoms, will give IV antibiotics and admit.  Cultures pending. Head CT shows no abnormality. PCP is [facility] in [place].   EKG Interpretation    Date/Time:  Monday November 22 2013 16:30:59 EST Ventricular Rate:  81 PR Interval:  144 QRS Duration: 87 QT Interval:  383 QTC Calculation: 445 R Axis:   11 Text Interpretation:  Sinus rhythm Borderline T abnormalities, anterior leads No significant change since 09/04/13 Confirmed by Halynn Reitano  DO, Sinan Tuch ([id number]) on 11/22/2013 5:03:31 PM             Layla Maw Naisha Wisdom, DO 11/23/13 [id number]

## 2013-11-22 NOTE — Telephone Encounter (Signed)
Son called reporting he and mom just left.  She currently "is out of it, is not responding and when she does she's talking about people gone twenty years ago.  She doesn't know where we are, who she is or where we're going."  Asked if she is passing out and not able to respond to his touch or voice and he says "yes".  Instructed to go to ER.  Dr. Arbutus Ped notified.  Checked with treatment nurse and patient was A&O just fidgety stating she had not taken her  Nerve pill for the day.

## 2013-11-22 NOTE — Progress Notes (Signed)
ANTIBIOTIC CONSULT NOTE - INITIAL  Pharmacy Consult for vancomycin/cefepime Indication: pneumonia  Allergies  Allergen Reactions  . Prednisone     insomnia  . Nsaids     GI Upset  . Aspirin Other (See Comments)    Gi symptoms. Does NOT take ibuprofen or other NSAIDS  . Dulera [Mometasone Furo-Formoterol Fum] Palpitations    Panting " my body was twisted inside and out"    Patient Measurements:   Adjusted Body Weight:   Vital Signs: Temp: 98.4 F (36.9 C) (11/24 1627) Temp src: Oral (11/24 1627) BP: 152/75 mmHg (11/24 1627) Pulse Rate: 87 (11/24 1627) Intake/Output from previous day:   Intake/Output from this shift:    Labs:  Recent Labs  11/22/13 1223 11/22/13 1224  WBC 4.3  --   HGB 11.0*  --   PLT 133*  --   CREATININE  --  0.6   The CrCl is unknown because both a height and weight (above a minimum accepted value) are required for this calculation. No results found for this basename: VANCOTROUGH, VANCOPEAK, VANCORANDOM, GENTTROUGH, GENTPEAK, GENTRANDOM, TOBRATROUGH, TOBRAPEAK, TOBRARND, AMIKACINPEAK, AMIKACINTROU, AMIKACIN,  in the last 72 hours   Microbiology: No results found for this or any previous visit (from the past 720 hour(s)).  Medical History: Past Medical History  Diagnosis Date  . H/O: stroke 2012, 2000,     X3  . Hypertension   . Anxiety   . PTSD (post-traumatic stress disorder)   . Depression   . SVD (spontaneous vaginal delivery)     x 1  . Smoker   . Osteoarthritis     knees, hips hands,   . Shortness of breath   . Pain     SEVERE PAIN BACK, RIGHT HIP AND KNEES--HARR[place] RODS AND CERVICAL PLATES--USES WALKER OR CANE WHEN AMBULATING - GOES TO A PAIN CLINIC-STATES SHE NEEDS RT HIP AND BOTH KNEES REPLACED. PT STATES SHE WAS TOLD THE CEMENT AROUND THE HARR[place] RODS IS CRACKED.  [name] Hot flashes, menopausal     SEVERE  . Family history of anesthesia complication     equipment failure caused problem  . Complication of anesthesia  03/02/13    severe headache,vertigo postop  . COPD (chronic obstructive pulmonary disease)     stable  . 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 Lung cancer     Assessment: 53 YOF s/p chemo and radiation today (11/24) for lung cancer, on drive home son states patient became confused with increased sedation.  Appears pulmonary started augmentin starting 11/17 with 10-day course for cough and yellow mucus (possible bronchitis).  CXR in ED reveals possible PNA. Orders to start vancomycin and Cefepime for pneumonia.   11/24 >>vancomycin  >> 11/24 >> cefepime >>    Tmax: afeb WBCs: 4.3 Renal: SCr = 0.6 for est CrCl = 91ml/min (N), 87ml/min (C-G)  11/24 blood: 11/24 urine:   Goal of Therapy:  Vancomycin trough level 15-20 mcg/ml  Plan:   Vancomycin 1500mg  IV x1 then vancomycin 1gm IV q12h  Check steady-state vancomycin trough  Cefepime 1gm IV q8h  Increase dose to 2gm IV q8h if becomes neutropenic  Dannielle Huh 11/22/2013,5:39 PM

## 2013-11-22 NOTE — Progress Notes (Signed)
Weekly rad onc rt lung 26/33 completed, eating better,coughing up yellow sputum, no new meds, 96% room air, still fatigued, no nausea 12:08 PM

## 2013-11-22 NOTE — ED Notes (Signed)
Pt brought in by son. States that she was riding down the road post chemo and radiation for lung cancer approx 30 mins ago and was not responding appropriately and was falling asleep in between speaking. Pt alert and oriented x 4 at this time. Denies cp/numbness/tingling.

## 2013-11-22 NOTE — Progress Notes (Signed)
Called to ED to get report Nurse getting report from EMS concerning another patient.

## 2013-11-22 NOTE — Progress Notes (Signed)
Department of Radiation Oncology  Phone:  [phone] Fax:        [phone]  Weekly Treatment Note    Name: SHANETTE TAMARGO Date: 11/22/2013 MRN: [id number] DOB: 04/13/1946   Current dose: 52 Gy  Current fraction: 26   MEDICATIONS: Current Outpatient Prescriptions  Medication Sig Dispense Refill  . Aclidinium Bromide (TUDORZA PRESSAIR) 400 MCG/ACT AEPB Inhale 1 puff into the lungs 2 (two) times daily.      [name] albuterol (PROVENTIL) (2.5 MG/3ML) 0.083% nebulizer solution Take 2.5 mg by nebulization as needed for wheezing.      [name] albuterol (PROVENTIL) (5 MG/ML) 0.5% nebulizer solution Take 2.5 mg by nebulization every 6 (six) hours as needed for wheezing.      [name] albuterol (VENTOLIN HFA) 108 (90 BASE) MCG/ACT inhaler Inhale 2 puffs into the lungs every 6 (six) hours as needed for wheezing or shortness of breath.  1 Inhaler  11  . ALPRAZolam (XANAX) 1 MG tablet Take 2 tablets (2 mg total) by mouth 4 (four) times daily.  90 tablet  0  . amoxicillin-clavulanate (AUGMENTIN) 875-125 MG per tablet Take 1 tablet by mouth 2 (two) times daily.  20 tablet  0  . atenolol (TENORMIN) 25 MG tablet Take 25 mg by mouth daily.      . benzonatate (TESSALON) 200 MG capsule TAKE (1) CAPSULE THREE TIMES DAILY AS NEEDED FOR COUGH.  30 capsule  1  . chlorzoxazone (PARAFON) 500 MG tablet Take 500 mg by mouth 4 (four) times daily as needed for muscle spasms.      [name] dextromethorphan-guaiFENesin (MUCINEX DM) 30-600 MG per 12 hr tablet Take 1 tablet by mouth every 12 (twelve) hours.      . diphenhydrAMINE (BENADRYL) 25 mg capsule Take 25 mg by mouth 2 (two) times daily.       . diphenoxylate-atropine (LOMOTIL) 2.5-0.025 MG per tablet Take 2 tablets by mouth 4 (four) times daily as needed for diarrhea or loose stools.  40 tablet  0  . esomeprazole (NEXIUM) 40 MG capsule Take 40 mg by mouth daily before breakfast.      . HYDROcodone-acetaminophen (NORCO) 10-325 MG per tablet Take 1 tablet by mouth every 4  (four) hours as needed.  90 tablet  0  . nicotine (NICODERM CQ - DOSED IN MG/24 HOURS) 14 mg/24hr patch Place 1 patch onto the skin daily.  28 patch  0  . prochlorperazine (COMPAZINE) 10 MG tablet Take 1 tablet (10 mg total) by mouth every 6 (six) hours as needed.  60 tablet  0  . sertraline (ZOLOFT) 100 MG tablet Take 200 mg by mouth daily.       . sucralfate (CARAFATE) 1 G tablet Take 1 tablet (1 g total) by mouth 4 (four) times daily.  120 tablet  2  . trazodone (DESYREL) 300 MG tablet Take 300 mg by mouth at bedtime.       No current facility-administered medications for this encounter.     ALLERGIES: Prednisone; Nsaids; Aspirin; and Dulera   LABORATORY DATA:  Lab Results  Component Value Date   WBC 4.3 11/22/2013   HGB 11.0* 11/22/2013   HCT 34.1* 11/22/2013   MCV 93.2 11/22/2013   PLT 133* 11/22/2013   Lab Results  Component Value Date   NA 138 11/15/2013   K 3.6 11/15/2013   CL 101 09/06/2013   CO2 26 11/15/2013   Lab Results  Component Value Date   ALT 105* 11/15/2013   AST 63* 11/15/2013  ALKPHOS 177* 11/15/2013   BILITOT 0.20 11/15/2013     NARRATIVE: ARIANIS BOWDITCH was seen today for weekly treatment management. The chart was checked and the patient's films were reviewed. The patient is doing much better this week she states. Still some fatigue. No nausea. No worsening esophagitis. She feels that she is eating quite well.  PHYSICAL EXAMINATION: weight is 181 lb 8 oz (82.328 kg). Her oral temperature is 98.5 F (36.9 C). Her blood pressure is 116/71 and her pulse is 88. Her respiration is 20 and oxygen saturation is 96%.        ASSESSMENT: The patient is doing satisfactorily with treatment.  PLAN: We will continue with the patient's radiation treatment as planned.

## 2013-11-22 NOTE — ED Notes (Signed)
Patient was walked to restroom by another nurse that was not aware that we needed a urine specimen

## 2013-11-23 ENCOUNTER — Ambulatory Visit: Payer: Medicare Other

## 2013-11-23 ENCOUNTER — Ambulatory Visit: Admission: RE | Admit: 2013-11-23 | Payer: Medicare Other | Source: Ambulatory Visit

## 2013-11-23 ENCOUNTER — Ambulatory Visit
Admit: 2013-11-23 | Discharge: 2013-11-23 | Disposition: A | Discharge status: Home / Self Care | Payer: Medicare Other | Attending: Radiation Oncology | Admitting: Radiation Oncology

## 2013-11-23 DIAGNOSIS — R4182 Altered mental status, unspecified: Secondary | ICD-10-CM

## 2013-11-23 DIAGNOSIS — C3490 Malignant neoplasm of unspecified part of unspecified bronchus or lung: Secondary | ICD-10-CM

## 2013-11-23 DIAGNOSIS — J449 Chronic obstructive pulmonary disease, unspecified: Secondary | ICD-10-CM

## 2013-11-23 DIAGNOSIS — I10 Essential (primary) hypertension: Secondary | ICD-10-CM

## 2013-11-23 DIAGNOSIS — R52 Pain, unspecified: Secondary | ICD-10-CM

## 2013-11-23 LAB — COMPREHENSIVE METABOLIC PANEL
ALT: 82 U/L — ABNORMAL HIGH (ref 0–35)
Albumin: 3.1 g/dL — ABNORMAL LOW (ref 3.5–5.2)
Alkaline Phosphatase: 184 U/L — ABNORMAL HIGH (ref 39–117)
BUN: 8 mg/dL (ref 6–23)
CO2: 25 mEq/L (ref 19–32)
Chloride: 99 mEq/L (ref 96–112)
GFR calc Af Amer: >90 mL/min (ref >90)
GFR calc non Af Amer: >90 mL/min (ref >90)
Glucose, Bld: 155 mg/dL — ABNORMAL HIGH (ref 70–99)
Potassium: 4.3 mEq/L (ref 3.5–5.1)
Total Bilirubin: 0.4 mg/dL (ref 0.3–1.2)

## 2013-11-23 LAB — CBC WITH DIFFERENTIAL/PLATELET
Basophils Absolute: 0 10*3/uL (ref 0.0–0.1)
Basophils Relative: 0 % (ref 0–1)
Eosinophils Relative: 0 % (ref 0–5)
Lymphocytes Relative: 5 % — ABNORMAL LOW (ref 12–46)
Lymphs Abs: 0.2 10*3/uL — ABNORMAL LOW (ref 0.7–4.0)
MCHC: 32.8 g/dL (ref 30.0–36.0)
MCV: 92.4 fL (ref 78.0–100.0)
Monocytes Absolute: 0.1 10*3/uL (ref 0.1–1.0)
Neutro Abs: 2.9 10*3/uL (ref 1.7–7.7)
Neutrophils Relative %: 92 % — ABNORMAL HIGH (ref 43–77)
Platelets: 132 10*3/uL — ABNORMAL LOW (ref 150–400)
RBC: 3.56 MIL/uL — ABNORMAL LOW (ref 3.87–5.11)
RDW: 15.8 % — ABNORMAL HIGH (ref 11.5–15.5)
WBC: 3.1 10*3/uL — ABNORMAL LOW (ref 4.0–10.5)

## 2013-11-23 LAB — LEGIONELLA ANTIGEN, URINE: Legionella Antigen, Urine: NEGATIVE

## 2013-11-23 MED ORDER — HYDROMORPHONE HCL PF 1 MG/ML IJ SOLN
1.0000 mg | Freq: Once | INTRAMUSCULAR | Status: AC
  Administered 2013-11-23: 1 mg via INTRAVENOUS
  Filled 2013-11-23: qty 1

## 2013-11-23 MED ORDER — IPRATROPIUM BROMIDE 0.02 % IN SOLN
0.5000 mg | Freq: Four times a day (QID) | RESPIRATORY_TRACT | Status: DC
  Administered 2013-11-23 – 2013-11-25 (×7): 0.5 mg via RESPIRATORY_TRACT
  Filled 2013-11-23 (×7): qty 2.5

## 2013-11-23 MED ORDER — IPRATROPIUM BROMIDE 0.02 % IN SOLN: 0.5000 mg | RESPIRATORY_TRACT | Status: DC | PRN

## 2013-11-23 MED ORDER — CYCLOBENZAPRINE HCL 5 MG PO TABS
5.0000 mg | ORAL_TABLET | Freq: Three times a day (TID) | ORAL | Status: DC | PRN
  Administered 2013-11-23: 5 mg via ORAL
  Filled 2013-11-23: qty 1

## 2013-11-23 MED ORDER — ALBUTEROL SULFATE (5 MG/ML) 0.5% IN NEBU: 2.5000 mg | INHALATION_SOLUTION | RESPIRATORY_TRACT | Status: DC | PRN

## 2013-11-23 MED ORDER — AMLODIPINE BESYLATE 5 MG PO TABS
5.0000 mg | ORAL_TABLET | Freq: Every day | ORAL | Status: DC
  Administered 2013-11-23 – 2013-11-25 (×3): 5 mg via ORAL
  Filled 2013-11-23 (×3): qty 1

## 2013-11-23 MED ORDER — ATENOLOL 25 MG PO TABS
25.0000 mg | ORAL_TABLET | Freq: Once | ORAL | Status: AC
  Administered 2013-11-23: 25 mg via ORAL
  Filled 2013-11-23: qty 1

## 2013-11-23 MED ORDER — ATENOLOL 50 MG PO TABS
50.0000 mg | ORAL_TABLET | Freq: Every day | ORAL | Status: DC
  Administered 2013-11-24 – 2013-11-25 (×2): 50 mg via ORAL
  Filled 2013-11-23 (×2): qty 1

## 2013-11-23 MED ORDER — HYDRALAZINE HCL 20 MG/ML IJ SOLN
10.0000 mg | Freq: Once | INTRAMUSCULAR | Status: AC
  Administered 2013-11-23: 06:00:00 via INTRAVENOUS
  Filled 2013-11-23: qty 1

## 2013-11-23 MED ORDER — ALBUTEROL SULFATE (5 MG/ML) 0.5% IN NEBU
2.5000 mg | INHALATION_SOLUTION | Freq: Four times a day (QID) | RESPIRATORY_TRACT | Status: DC
  Administered 2013-11-23 – 2013-11-25 (×7): 2.5 mg via RESPIRATORY_TRACT
  Filled 2013-11-23 (×7): qty 0.5

## 2013-11-23 MED ORDER — CHLORZOXAZONE 500 MG PO TABS
500.0000 mg | ORAL_TABLET | Freq: Four times a day (QID) | ORAL | Status: DC | PRN
  Administered 2013-11-23 – 2013-11-25 (×4): 500 mg via ORAL
  Filled 2013-11-23 (×5): qty 1

## 2013-11-23 NOTE — Progress Notes (Signed)
INITIAL NUTRITION ASSESSMENT  DOCUMENTATION CODES Per approved criteria  -Not Applicable   INTERVENTION: - [employer] Instant Breakfast BID - Encouraged increased meal intake - Will continue to monitor   NUTRITION DIAGNOSIS: Inadequate oral intake related to poor appetite as evidenced by pt report.   Goal: Pt to consume >90% of meals/supplements  Monitor:  Weights, labs, intake   Reason for Assessment: Malnutrition screening tool   67 y.o. female  Admitting Dx: Acute encephalopathy  ASSESSMENT: Pt with history of hypertension, history of CVA x3, anxiety, PTSD, depression, COPD, locally advanced likely stage IIIa non-small cell lung cancer, squamous cell carcinoma diagnosed in September of 2014 concurrently on chemoradiation, status post 3 cycles who presents to the ED with confusion after receiving chemotherapy and radiation.   Met with pt who reports not eating for 2 days PTA r/t not having an appetite and having nausea. Reports 3-4 episodes of emesis last week and having 1 day of having 7-8 bowel movements. Reports starting to drink [employer] due to poor appetite. Eats 3 small meals/day at home. Performed nutrition focused physical exam which was WNL. Denies any nausea/vomiting/diarrhea today.   Alk phos and ALT elevated.    Height: Ht Readings from Last 1 Encounters:  11/23/13 5' 6.14" (1.68 m)    Weight: Wt Readings from Last 1 Encounters:  11/22/13 180 lb 1.6 oz (81.693 kg)    Ideal Body Weight: 130 lb   % Ideal Body Weight: 138%  Wt Readings from Last 10 Encounters:  11/22/13 180 lb 1.6 oz (81.693 kg)  11/22/13 181 lb 8 oz (82.328 kg)  11/19/13 175 lb (79.379 kg)  11/12/13 177 lb 3.2 oz (80.377 kg)  11/08/13 179 lb 3.2 oz (81.285 kg)  11/03/13 174 lb (78.926 kg)  10/26/13 175 lb 14.4 oz (79.788 kg)  10/07/13 176 lb 4.8 oz (79.969 kg)  09/30/13 175 lb 3.2 oz (79.47 kg)  09/15/13 174 lb (78.926 kg)    Usual Body Weight: 135 lb per  pt  % Usual Body Weight: 133%  BMI:  Body mass index is 28.94 kg/(m^2).  Estimated Nutritional Needs: Kcal: 1450-1650 Protein: 70-80g Fluid: 1.4-1.6L/day  Skin: intact   Diet Order: General  EDUCATION NEEDS: -No education needs identified at this time   Intake/Output Summary (Last 24 hours) at 11/23/13 1333 Last data filed at 11/23/13 0749  Gross per 24 hour  Intake 1298.75 ml  Output   1200 ml  Net  98.75 ml    Last BM: 11/24  Labs:   Recent Labs Lab 11/22/13 1224 11/22/13 1951 11/23/13 0517  NA 138  --  134*  K 4.0  --  4.3  CL  --   --  99  CO2 27  --  25  BUN 6.2*  --  8  CREATININE 0.6 0.71 0.53  CALCIUM 8.7  --  8.7  MG  --  1.7  --   GLUCOSE 72  --  155*    CBG (last 3)  No results found for this basename: GLUCAP,  in the last 72 hours  Scheduled Meds: . albuterol  2.5 mg Nebulization Q6H WA  . atenolol  25 mg Oral Daily  . ceFEPime (MAXIPIME) IV  1 g Intravenous Q8H  . enoxaparin (LOVENOX) injection  40 mg Subcutaneous Q24H  . guaiFENesin  1,200 mg Oral BID  . ipratropium  0.5 mg Nebulization Q6H WA  . nicotine  14 mg Transdermal Daily  . pantoprazole  80 mg Oral Q1200  .  sertraline  200 mg Oral Daily  . sucralfate  1 g Oral QID  . trazodone  300 mg Oral QHS  . vancomycin  1,000 mg Intravenous Q12H    Continuous Infusions: . sodium chloride 75 mL/hr at 11/22/13 1901    Past Medical History  Diagnosis Date  . H/O: stroke 2012, 2000,     X3  . Hypertension   . Anxiety   . PTSD (post-traumatic stress disorder)   . Depression   . SVD (spontaneous vaginal delivery)     x 1  . Smoker   . Osteoarthritis     knees, hips hands,   . Shortness of breath   . Pain     SEVERE PAIN BACK, RIGHT HIP AND KNEES--H[place] RODS AND CERVICAL PLATES--USES WALKER OR CANE WHEN AMBULATING - GOES TO A PAIN CLINIC-STATES SHE NEEDS RT HIP AND BOTH KNEES REPLACED. PT STATES SHE WAS TOLD THE CEMENT AROUND THE H[place] RODS IS CRACKED.  [name] Hot flashes,  menopausal     SEVERE  . Family history of anesthesia complication     equipment failure caused problem  . Complication of anesthesia 03/02/13    severe headache,vertigo postop  . COPD (chronic obstructive pulmonary disease)     stable  . 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 Lung cancer     Past Surgical History  Procedure Laterality Date  . Carpal tunnel release      BILATERAL  . Nasal sinus surgery    . Lumbar fusion      L4-S1  . Cervical fusion    . Hernia repair    . Salpingoophorectomy Right   . Vaginal hysterectomy      TAH w/ ovary removal  . Transthoracic echocardiogram  12-03-2011    LVSF NORMAL/ EF 60-65%  . Vulvectomy N/A 03/02/2013    Procedure: WIDE LOCAL EXCISION VULVAR;  Surgeon: Rejeana Brock A. Duard Brady, MD;  Location: [facility];  Service: Gynecology;  Laterality: N/A;  . Co2 laser application N/A 05/13/2013    Procedure: LASER APPLICATION OF THE VULVA;  Surgeon: Laurette Schimke, MD;  Location: [facility];  Service: Gynecology;  Laterality: N/A;  . Video bronchoscopy Bilateral 09/23/2013    Procedure: VIDEO BRONCHOSCOPY WITHOUT FLUORO;  Surgeon: Nyoka Cowden, MD;  Location: [facility];  Service: Cardiopulmonary;  Laterality: Bilateral;    Levon Hedger MS, RD, LDN [phone] Pager [phone] After Hours Pager

## 2013-11-23 NOTE — Evaluation (Signed)
Occupational Therapy Evaluation Patient Details Name: [name] MRN: [id number] DOB: Mar 20, 1946 Today's Date: 11/23/2013 Time: 9528-4132 OT Time Calculation (min): 15 min  OT Assessment / Plan / Recommendation History of present illness Patient is a 67 year old female with history of prior CVA, hypertension, right lower lobe carcinoma currently receiving chemotherapy and radiation (last each was today) who presents to the emergency department with her son with concerns for altered mental status. Patient's son reports patient was doing well prior to her treatments today at approximately 1:30 PM. After her treatments patient's son reports she was having hallucinations. She was talking to her grandmother and her daughter-in-law that have both been deceased for many years. Son reports she is also falling asleep easily in a car. Patient reports that she does not remember these events. She denies any headache or neck pain, neck stiffness. No chest pain or shortness of breath. No abdominal pain. She has had nausea and vomiting, last was 3 days ago. No diarrhea. No dysuria or hematuria. No numbness, tingling or focal weakness. No history of anticoagulation use or head injury. No fever or chills. She has recently had cough with yellow sputum production. She was started on Augmentin for possible bronchitis on 11/15/13. She takes Norco and Xanax and reports taking both of these medications today but has not taken more than her prescribed amount.   Clinical Impression   Pt up in room for ADL with min guard to supervision assist. Pt will benefit from skilled OT services to maximize safety and independence for return home to apartment.    OT Assessment  Patient needs continued OT Services    Follow Up Recommendations  No OT follow up;Supervision - Intermittent    Barriers to Discharge      Equipment Recommendations  3 in 1 bedside comode    Recommendations for Other Services    Frequency  Min  2X/week    Precautions / Restrictions Precautions Precautions: Fall Restrictions Weight Bearing Restrictions: No   Pertinent Vitals/Pain States she had something for pain (headache) states it is better.    ADL  Eating/Feeding: Simulated;Independent Where Assessed - Eating/Feeding: Bed level Grooming: Simulated;Wash/dry hands;Set up Where Assessed - Grooming: Supine, head of bed up Upper Body Bathing: Simulated;Right arm;Chest;Left arm;Abdomen;Set up Where Assessed - Upper Body Bathing: Unsupported sitting Lower Body Bathing: Simulated;Supervision/safety Where Assessed - Lower Body Bathing: Supported sit to stand Upper Body Dressing: Simulated;Set up Where Assessed - Upper Body Dressing: Unsupported sitting Lower Body Dressing: Simulated;Supervision/safety Where Assessed - Lower Body Dressing: Supported sit to stand Toilet Transfer: Performed;Min guard (quad cane) Acupuncturist: Materials engineer and Hygiene: Performed;Supervision/safety Where Assessed - Engineer, mining and Hygiene: Sit to stand from 3-in-1 or toilet Equipment Used:  (quad cane) ADL Comments: Pt states sometimes it is difficult to reach down to feet to don socks but pt able to do so today.    OT Diagnosis: Generalized weakness  OT Problem List: Decreased strength;Decreased knowledge of use of DME or AE OT Treatment Interventions: Self-care/ADL training;DME and/or AE instruction;Therapeutic activities;Patient/family education   OT Goals(Current goals can be found in the care plan section) Acute Rehab OT Goals Patient Stated Goal: to return to independence OT Goal Formulation: With patient Time For Goal Achievement: 12/07/13 Potential to Achieve Goals: Good  Visit Information  Last OT Received On: 11/23/13 Assistance Needed: +1 History of Present Illness: Patient is a 67 year old female with history of prior CVA, hypertension, right lower lobe  carcinoma currently receiving  chemotherapy and radiation (last each was today) who presents to the emergency department with her son with concerns for altered mental status. Patient's son reports patient was doing well prior to her treatments today at approximately 1:30 PM. After her treatments patient's son reports she was having hallucinations. She was talking to her grandmother and her daughter-in-law that have both been deceased for many years. Son reports she is also falling asleep easily in a car. Patient reports that she does not remember these events. She denies any headache or neck pain, neck stiffness. No chest pain or shortness of breath. No abdominal pain. She has had nausea and vomiting, last was 3 days ago. No diarrhea. No dysuria or hematuria. No numbness, tingling or focal weakness. No history of anticoagulation use or head injury. No fever or chills. She has recently had cough with yellow sputum production. She was started on Augmentin for possible bronchitis on 11/15/13. She takes Norco and Xanax and reports taking both of these medications today but has not taken more than her prescribed amount.       Prior Functioning     Home Living Family/patient expects to be discharged to:: Private residence Living Arrangements: Alone;Other (Comment) (neighbors that can check on her) Available Help at Discharge: Neighbor Type of Home: Apartment Home Access: Elevator Home Layout: One level Home Equipment: Cane - quad;Shower seat Prior Function Level of Independence: Independent with assistive device(s) Comments: does own cooking but states she mostly warms up microwave meals. Communication Communication: No difficulties         Vision/Perception     Cognition  Cognition Arousal/Alertness: Awake/alert Behavior During Therapy: WFL for tasks assessed/performed Overall Cognitive Status: Within Functional Limits for tasks assessed    Extremity/Trunk Assessment Upper Extremity  Assessment Upper Extremity Assessment: Overall WFL for tasks assessed     Mobility Bed Mobility Bed Mobility: Supine to Sit;Sit to Supine Supine to Sit: 6: Modified independent (Device/Increase time);HOB elevated Sit to Supine: 6: Modified independent (Device/Increase time);HOB elevated Transfers Transfers: Sit to Stand;Stand to Sit Sit to Stand: 5: Supervision;With upper extremity assist;From bed;From chair/3-in-1 Stand to Sit: 5: Supervision;With upper extremity assist;To chair/3-in-1;To bed     Exercise     Balance Balance Balance Assessed: Yes Dynamic Standing Balance Dynamic Standing - Level of Assistance: 5: Stand by assistance   End of Session OT - End of Session Activity Tolerance: Patient tolerated treatment well Patient left: in bed;with call bell/phone within reach  GO     Lennox Laity [phone] 11/23/2013, 10:05 AM

## 2013-11-23 NOTE — Progress Notes (Signed)
DIAGNOSIS: Locally advanced, likely stage IIIA (T2a., N2, M0) non-small cell lung cancer, squamous cell carcinoma diagnosed in September of 2014.  PRIOR THERAPY: None  CURRENT THERAPY: Concurrent chemoradiation with weekly carboplatin for AUC of 2 and paclitaxel 45 mg/M2, status post 3 cycles. First dose was given on 10/18/2013.  CHEMOTHERAPY INTENT: Curative/control  CURRENT # OF CHEMOTHERAPY CYCLES: 6  CURRENT ANTIEMETICS: Zofran, Compazine and dexamethasone  CURRENT SMOKING STATUS: Current smoker and she was strongly advised to quit smoking and also to smoke cessation program  ORAL CHEMOTHERAPY AND CONSENT: None  CURRENT BISPHOSPHONATES USE: None  PAIN MANAGEMENT: 0/10  NARCOTICS INDUCED CONSTIPATION: None  LIVING WILL AND CODE STATUS: Full code   Subjective: The patient is seen and examined today. She was admitted yesterday with confusion after her chemotherapy. She tolerated the previous 5 cycles of her treatment fairly well with no significant issues. There is a concern that the patient may have taken some half her pain medications and anxiety meds during her treatment which included premedication with Benadryl 50 mg in addition to Decadron. She is feeling much better today but continues to have pain and spasm on the back of her neck. She is requesting more pain medication as well as Xanax for her anxiety. She normally takes Xanax 2 mg by mouth every 6 hours. The patient denied having any fever or chills. She denied having any chest pain but continues to have shortness breath with exertion with no hemoptysis. Chest x-ray performed yesterday showed a questionable right lower lobe pneumonia but this is in the same area of her lung cancer.   Objective: Vital signs in last 24 hours: Temp:  [97.7 F (36.5 C)-98.5 F (36.9 C)] 98.3 F (36.8 C) (11/25 1407) Pulse Rate:  [76-92] 88 (11/25 1407) Resp:  [18-20] 18 (11/25 0600) BP: (152-203)/(65-92) 177/65 mmHg (11/25 1407) SpO2:  [91 %-99 %]  94 % (11/25 1407) Weight:  [180 lb 1.6 oz (81.693 kg)] 180 lb 1.6 oz (81.693 kg) (11/24 1900)  Intake/Output from previous day: 11/24 0701 - 11/25 0700 In: 1298.8 [I.V.:748.8; IV Piggyback:550] Out: -  Intake/Output this shift: Total I/O In: -  Out: 1200 [Urine:1200]  General appearance: alert, cooperative, fatigued and no distress Resp: diminished breath sounds RLL Cardio: regular rate and rhythm, S1, S2 normal, no murmur, click, rub or gallop GI: soft, non-tender; bowel sounds normal; no masses,  no organomegaly Extremities: extremities normal, atraumatic, no cyanosis or edema  Lab Results:   Recent Labs  11/22/13 1951 11/23/13 0517  WBC 3.2* 3.1*  HGB 11.7* 10.8*  HCT 35.4* 32.9*  PLT 147* 132*   BMET  Recent Labs  11/22/13 1224 11/22/13 1951 11/23/13 0517  NA 138  --  134*  K 4.0  --  4.3  CL  --   --  99  CO2 27  --  25  GLUCOSE 72  --  155*  BUN 6.2*  --  8  CREATININE 0.6 0.71 0.53  CALCIUM 8.7  --  8.7    Studies/Results: Dg Chest 2 View  11/22/2013   CLINICAL DATA:  Cough, productive sputum  EXAM: CHEST  2 VIEW  COMPARISON:  09/04/2013  FINDINGS: Cardiomediastinal silhouette is stable. There is hazy airspace is in right lower lobe medially highly suspicious for pneumonia. Follow-up to resolution after appropriate treatment is recommended. Osteopenia and mild degenerative changes lower thoracic spine. Again noted poorly visualized mass in right lower lobe.  IMPRESSION: Hazy airspace is in right lower lobe medially highly suspicious  for pneumonia. Follow-up to resolution after appropriate treatment is recommended. Poorly visualized mass in right lower lobe again noted.   Electronically Signed   By: Natasha Mead M.D.   On: 11/22/2013 17:08   Ct Head Wo Contrast  11/22/2013   CLINICAL DATA:  Altered mental status, history of lung cancer  EXAM: CT HEAD WITHOUT CONTRAST  TECHNIQUE: Contiguous axial images were obtained from the base of the skull through the  vertex without intravenous contrast.  COMPARISON:  10/07/2013  FINDINGS: No skull fracture is noted. Paranasal sinuses and mastoid air cells are unremarkable.  No intracranial hemorrhage, mass effect or midline shift. Ventricular size is stable from prior exam. No acute infarction. No mass lesion is noted on this unenhanced scan. Stable mild cerebral atrophy.  IMPRESSION: No acute intracranial abnormality.  Stable mild cerebral atrophy.   Electronically Signed   By: Natasha Mead M.D.   On: 11/22/2013 17:11    Medications: I have reviewed the patient's current medications.  Assessment/Plan: This is a very pleasant 67 years old white female with a stage IIIa non-small cell lung cancer currently undergoing concurrent chemoradiation with weekly carboplatin and paclitaxel status post 6 cycles. The patient is tolerating her treatment fairly well. She has one more week off concurrent chemoradiation left. The patient was admitted yesterday with confusion which is most likely secondary to polypharmacy including her anxiety med 7, pain medication as well as the chemotherapy premedication. She is feeling much better today but she is requesting her pain and anxiety medication again.  She is hurting too much and I give her a dose of Diluadid 1 mg IV times one. I strongly advise the patient to be careful with her pain and anxiety medications and take 3 doses as prescribed by her primary care physician. For the questionable right lower lobe pneumonia, I agree with the current treatment with vancomycin and cefepime. She will followup with me on an outpatient basis as scheduled. Thank you for taking good care of Ms. Vear Clock. I will continue to follow up with you and assist in her management on as-needed basis.  LOS: 1 day    Horst Ostermiller K. 11/23/2013

## 2013-11-23 NOTE — Evaluation (Signed)
Physical Therapy Evaluation Patient Details Name: [name] MRN: [id number] DOB: 1946-11-07 Today's Date: 11/23/2013 Time: 7829-5621 PT Time Calculation (min): 34 min  PT Assessment / Plan / Recommendation History of Present Illness  pt admitted with AMS , currently receiving chemo/radiation for lung ca.  Clinical Impression  Pt ambulating with her quad cane. Cues for safety in room. Pt will benefit from PT to address problems listed.     PT Assessment  Patient needs continued PT services    Follow Up Recommendations  No PT follow up (vs safety eval )    Does the patient have the potential to tolerate intense rehabilitation      Barriers to Discharge        Equipment Recommendations  None recommended by PT    Recommendations for Other Services     Frequency Min 3X/week    Precautions / Restrictions Precautions Precautions: Fall   Pertinent Vitals/Pain Pt reports neck and upper shoulder pain.      Mobility  Bed Mobility Supine to Sit: 6: Modified independent (Device/Increase time);HOB elevated Sit to Supine: 6: Modified independent (Device/Increase time);HOB elevated Details for Bed Mobility Assistance: no assist. Transfers Sit to Stand: 5: Supervision;With upper extremity assist;From bed;From chair/3-in-1 Stand to Sit: 5: Supervision;With upper extremity assist;To chair/3-in-1;To bed Details for Transfer Assistance: cues for safety with cords and lines, pt is impulsive. Ambulation/Gait Ambulation/Gait Assistance: 4: Min guard Ambulation Distance (Feet): 200 Feet Assistive device: Small based quad cane Gait Pattern: Step-through pattern Gait velocity: decreased    Exercises     PT Diagnosis: Generalized weakness  PT Problem List: Decreased activity tolerance;Decreased safety awareness;Decreased knowledge of precautions PT Treatment Interventions: DME instruction;Gait training;Functional mobility training;Therapeutic activities;Patient/family education      PT Goals(Current goals can be found in the care plan section) Acute Rehab PT Goals Patient Stated Goal: to return to independence PT Goal Formulation: With patient Time For Goal Achievement: 12/07/13 Potential to Achieve Goals: Good  Visit Information  Last PT Received On: 11/23/13 Assistance Needed: +1 History of Present Illness: pt admitted with AMS , currently receiving chemo/radiation for lung ca.       Prior Functioning  Home Living Family/patient expects to be discharged to:: Private residence Living Arrangements: Alone;Other (Comment) Available Help at Discharge: Neighbor;Family Type of Home: Apartment Home Access: Elevator Home Layout: One level Home Equipment: Cane - quad;Shower seat Prior Function Level of Independence: Independent with assistive device(s) Comments: does own cooking but states she mostly warms up microwave meals.    Cognition  Cognition Arousal/Alertness: Awake/alert Behavior During Therapy: WFL for tasks assessed/performed;Impulsive Overall Cognitive Status: Difficult to assess Area of Impairment: Safety/judgement;Awareness Memory: Decreased recall of precautions General Comments: pt told son on phone that the weather was cold and it was 100% raining in [place]. Form conversation that pt had with son, pt may have been a bit confused/    Extremity/Trunk Assessment Lower Extremity Assessment Lower Extremity Assessment: Overall WFL for tasks assessed   Balance Dynamic Standing Balance Dynamic Standing - Level of Assistance: 5: Stand by assistance  End of Session PT - End of Session Activity Tolerance: Patient tolerated treatment well Patient left: in bed;with call bell/phone within reach Nurse Communication: Mobility status  GP     Rada Hay 11/23/2013, 4:09 PM Blanchard Kelch PT [phone]

## 2013-11-23 NOTE — Progress Notes (Signed)
[facility] PROGRESS NOTE  [name] EAV:[id number] DOB: 1946/09/06 DOA: 11/22/2013 PCP: [name], MD  Assessment/Plan: #1 acute encephalopathy Likely a metabolic encephalopathy secondary to probable healthcare associated pneumonia. Clinical improvement. Continue empiric IV antibiotics of vancomycin and cefepime. Supportive care.  #2 probable healthcare associated pneumonia Patient presented with altered mental status. Patient noted to have a probable pneumonia in the right lower lobe per chest x-ray. Patient would rhonchorous breath sounds and a productive cough. Clinical improvement. Afebrile. White count of 3.1. Urine Legionella antigen is negative. Urine pneumococcus antigen is negative. Sputum Gram stain and culture pending. Continue empiric IV vancomycin and cefepime, oxygen, Mucinex, schedule nebulizers.  #3 COPD Stable. Continue scheduled nebulizers. Continue IV antibiotics for problem #2.  #4 anxiety Continue Xanax as needed.  #5 chronic pain Oxycodone as needed. Resume muscle relaxants.  #6 hypertension Patient's blood pressure is elevated. Increase atenolol to 50 mg daily. Add low-dose Norvasc. Follow and titrate as needed.  #7 locally advanced likely stage IIIa non-small cell lung cancer, squamous cell lung cancer Patient is receiving concurrent chemoradiation. Oncology is aware of patient's admission. Per oncology.  #8 depression Stable. Continue Zoloft.  #9 prophylaxis PPI for GI prophylaxis. Lovenox for DVT prophylaxis.  Code Status: Full Family Communication: Updated patient no family at bedside. Disposition Plan: Home when medically stable.   Consultants:  None  Procedures:  CT head without contrast 11/22/2013  Chest x-ray 11/22/2013  Antibiotics:  IV vancomycin 11/22/2013  IV cefepime 11/22/2013  HPI/Subjective: Patient alert and oriented to self place time. Patient states she's feeling better. Patient asking to resume the muscle  relaxant. No complaints.  Objective: Filed Vitals:   11/23/13 1407  BP: 177/65  Pulse: 88  Temp: 98.3 F (36.8 C)  Resp:     Intake/Output Summary (Last 24 hours) at 11/23/13 1434 Last data filed at 11/23/13 0749  Gross per 24 hour  Intake 1298.75 ml  Output   1200 ml  Net  98.75 ml   Filed Weights   11/22/13 1900  Weight: 81.693 kg (180 lb 1.6 oz)    Exam:   General:  No apparent distress  Cardiovascular: Regular rate rhythm no murmurs rubs or gallops  Respiratory: Diffuse coarse rhonchorous breath sounds  Abdomen: Soft, nontender, nondistended, positive bowel sounds.  Musculoskeletal: No clubbing cyanosis or edema  Data Reviewed: Basic Metabolic Panel:  Recent Labs Lab 11/22/13 1224 11/22/13 1951 11/23/13 0517  NA 138  --  134*  K 4.0  --  4.3  CL  --   --  99  CO2 27  --  25  GLUCOSE 72  --  155*  BUN 6.2*  --  8  CREATININE 0.6 0.71 0.53  CALCIUM 8.7  --  8.7  MG  --  1.7  --    Liver Function Tests:  Recent Labs Lab 11/22/13 1224 11/23/13 0517  AST 29 23  ALT 107* 82*  ALKPHOS 196* 184*  BILITOT 0.38 0.4  PROT 6.5 6.7  ALBUMIN 3.0* 3.1*   No results found for this basename: LIPASE, AMYLASE,  in the last 168 hours No results found for this basename: AMMONIA,  in the last 168 hours CBC:  Recent Labs Lab 11/22/13 1223 11/22/13 1951 11/23/13 0517  WBC 4.3 3.2* 3.1*  NEUTROABS 3.4  --  2.9  HGB 11.0* 11.7* 10.8*  HCT 34.1* 35.4* 32.9*  MCV 93.2 92.9 92.4  PLT 133* 147* 132*   Cardiac Enzymes: No results found for this basename: CKTOTAL,  CKMB, CKMBINDEX, TROPONINI,  in the last 168 hours BNP (last 3 results) No results found for this basename: PROBNP,  in the last 8760 hours CBG: No results found for this basename: GLUCAP,  in the last 168 hours  No results found for this or any previous visit (from the past 240 hour(s)).   Studies: Dg Chest 2 View  11/22/2013   CLINICAL DATA:  Cough, productive sputum  EXAM: CHEST  2 VIEW   COMPARISON:  09/04/2013  FINDINGS: Cardiomediastinal silhouette is stable. There is hazy airspace is in right lower lobe medially highly suspicious for pneumonia. Follow-up to resolution after appropriate treatment is recommended. Osteopenia and mild degenerative changes lower thoracic spine. Again noted poorly visualized mass in right lower lobe.  IMPRESSION: Hazy airspace is in right lower lobe medially highly suspicious for pneumonia. Follow-up to resolution after appropriate treatment is recommended. Poorly visualized mass in right lower lobe again noted.   Electronically Signed   By: Natasha Mead M.D.   On: 11/22/2013 17:08   Ct Head Wo Contrast  11/22/2013   CLINICAL DATA:  Altered mental status, history of lung cancer  EXAM: CT HEAD WITHOUT CONTRAST  TECHNIQUE: Contiguous axial images were obtained from the base of the skull through the vertex without intravenous contrast.  COMPARISON:  10/07/2013  FINDINGS: No skull fracture is noted. Paranasal sinuses and mastoid air cells are unremarkable.  No intracranial hemorrhage, mass effect or midline shift. Ventricular size is stable from prior exam. No acute infarction. No mass lesion is noted on this unenhanced scan. Stable mild cerebral atrophy.  IMPRESSION: No acute intracranial abnormality.  Stable mild cerebral atrophy.   Electronically Signed   By: Natasha Mead M.D.   On: 11/22/2013 17:11    Scheduled Meds: . albuterol  2.5 mg Nebulization Q6H WA  . atenolol  25 mg Oral Daily  . ceFEPime (MAXIPIME) IV  1 g Intravenous Q8H  . enoxaparin (LOVENOX) injection  40 mg Subcutaneous Q24H  . guaiFENesin  1,200 mg Oral BID  . ipratropium  0.5 mg Nebulization Q6H WA  . nicotine  14 mg Transdermal Daily  . pantoprazole  80 mg Oral Q1200  . sertraline  200 mg Oral Daily  . sucralfate  1 g Oral QID  . trazodone  300 mg Oral QHS  . vancomycin  1,000 mg Intravenous Q12H   Continuous Infusions: . sodium chloride 75 mL/hr at 11/22/13 1901    Principal  Problem:   Acute encephalopathy Active Problems:   Hospital acquired PNA   HTN (hypertension)   Depression   Anxiety   COPD GOLD III   Arthralgia of hip   Chronic pain   Right lower lobe lung mass, Sq cell Ca ? IIIB vs IV   Lung cancer   Malignant neoplasm of lower lobe, bronchus, or lung    Time spent: 30 minutes    Zuly Belkin M.D. [facility] Pager [phone]. If 7PM-7AM, please contact night-coverage at [email or web address], password [facility] 11/23/2013, 2:34 PM  LOS: 1 day

## 2013-11-24 ENCOUNTER — Ambulatory Visit
Admission: RE | Admit: 2013-11-24 | Discharge: 2013-11-24 | Disposition: A | Discharge status: Home / Self Care | Payer: Medicare Other | Source: Ambulatory Visit | Attending: Radiation Oncology | Admitting: Radiation Oncology

## 2013-11-24 ENCOUNTER — Ambulatory Visit: Payer: Medicare Other

## 2013-11-24 DIAGNOSIS — F17200 Nicotine dependence, unspecified, uncomplicated: Secondary | ICD-10-CM

## 2013-11-24 LAB — COMPREHENSIVE METABOLIC PANEL
ALT: 56 U/L — ABNORMAL HIGH (ref 0–35)
AST: 19 U/L (ref 0–37)
Albumin: 2.8 g/dL — ABNORMAL LOW (ref 3.5–5.2)
BUN: 13 mg/dL (ref 6–23)
CO2: 26 mEq/L (ref 19–32)
Calcium: 8.5 mg/dL (ref 8.4–10.5)
Creatinine, Ser: 0.71 mg/dL (ref 0.50–1.10)
GFR calc non Af Amer: 88 mL/min — ABNORMAL LOW (ref >90)
Potassium: 3.6 mEq/L (ref 3.5–5.1)
Sodium: 138 mEq/L (ref 135–145)
Total Bilirubin: 0.3 mg/dL (ref 0.3–1.2)

## 2013-11-24 LAB — CBC WITH DIFFERENTIAL/PLATELET
Basophils Absolute: 0 10*3/uL (ref 0.0–0.1)
Basophils Relative: 0 % (ref 0–1)
Eosinophils Absolute: 0 10*3/uL (ref 0.0–0.7)
Eosinophils Relative: 0 % (ref 0–5)
HCT: 30 % — ABNORMAL LOW (ref 36.0–46.0)
Hemoglobin: 9.8 g/dL — ABNORMAL LOW (ref 12.0–15.0)
Lymphs Abs: 0.6 10*3/uL — ABNORMAL LOW (ref 0.7–4.0)
MCH: 30.2 pg (ref 26.0–34.0)
MCHC: 32.7 g/dL (ref 30.0–36.0)
MCV: 92.6 fL (ref 78.0–100.0)
Monocytes Absolute: 0.3 10*3/uL (ref 0.1–1.0)
Monocytes Relative: 8 % (ref 3–12)
Neutro Abs: 2.6 10*3/uL (ref 1.7–7.7)
Neutrophils Relative %: 76 % (ref 43–77)
RBC: 3.24 MIL/uL — ABNORMAL LOW (ref 3.87–5.11)
WBC: 3.4 10*3/uL — ABNORMAL LOW (ref 4.0–10.5)

## 2013-11-24 LAB — URINE CULTURE

## 2013-11-24 MED ORDER — LEVOFLOXACIN 750 MG PO TABS
750.0000 mg | ORAL_TABLET | Freq: Every day | ORAL | Status: DC
  Administered 2013-11-24 – 2013-11-25 (×2): 750 mg via ORAL
  Filled 2013-11-24 (×2): qty 1

## 2013-11-24 MED ORDER — OXYCODONE HCL 5 MG PO TABS
5.0000 mg | ORAL_TABLET | ORAL | Status: DC | PRN
  Administered 2013-11-24: 5 mg via ORAL
  Administered 2013-11-24 – 2013-11-25 (×4): 10 mg via ORAL
  Filled 2013-11-24 (×2): qty 2
  Filled 2013-11-24: qty 1
  Filled 2013-11-24 (×2): qty 2

## 2013-11-24 NOTE — Progress Notes (Signed)
Occupational Therapy Treatment Patient Details Name: [name] MRN: [id number] DOB: 24-Jul-1946 Today's Date: 11/24/2013 Time: 4098-1191 OT Time Calculation (min): 28 min  OT Assessment / Plan / Recommendation  History of present illness pt admitted with AMS , currently receiving chemo/radiation for lung ca.   OT comments  Awake and agreeable to participation in skilled o.t.  Able to perform bed mobility, lb dressing, and all aspects of toileting with s/min guard a.  States she feels more "clear headed" today but still "a little shaky"  Follow Up Recommendations  No OT follow up;Supervision - Intermittent;Other (comment) (pt. requesting info. on [facility] aide )           Equipment Recommendations  3 in 1 bedside comode        Frequency Min 2X/week   Progress towards OT Goals Progress towards OT goals: Progressing toward goals  Plan Discharge plan remains appropriate    Precautions / Restrictions Precautions Precautions: Fall   Pertinent Vitals/Pain 3/10, left knee    ADL  Lower Body Dressing: Performed;Supervision/safety Where Assessed - Lower Body Dressing: Unsupported sitting Toilet Transfer: Performed;Min guard Toilet Transfer Method: Sit to Barista: Regular height toilet Toileting - Clothing Manipulation and Hygiene: Performed;Min guard Where Assessed - Glass blower/designer Manipulation and Hygiene: Standing Transfers/Ambulation Related to ADLs: min guard, pt. walks with quad cane, no lob noted ADL Comments: pt. rquesting possibility of HH aide to assist with meals secondary to reports of being to fatigued to prepare healthy meals sometimes.  states lb dressing often difficult and may be interested in sock aide and reacher.  provided info on equipment in gift shop       OT Goals(current goals can now be found in the care plan section)    Visit Information  Last OT Received On: 11/24/13 History of Present Illness: pt admitted with AMS ,  currently receiving chemo/radiation for lung ca.    Subjective Data   "i just want to be able to hug my mother one more time before she passes away, she is not doing well right now"          Cognition  Cognition Arousal/Alertness: Awake/alert Behavior During Therapy: WFL for tasks assessed/performed General Comments: appears to be greatly improved from all previous doc.  pt. talking appropriately about time, place, and situation.  no safety issues noted while up in room amb. ie: sequencing and safety awareness    Mobility  Bed Mobility Details for Bed Mobility Assistance: mod i hob flat no rails Transfers Transfers: Stand to Sit Sit to Stand: 5: Supervision;From bed Stand to Sit: 5: Supervision;To bed               End of Session OT - End of Session Activity Tolerance: Patient tolerated treatment well Patient left: in bed;with call bell/phone within reach       Robet Leu, COTA/L 11/24/2013, 3:38 PM

## 2013-11-24 NOTE — Progress Notes (Signed)
PT Cancellation Note  ___Treatment cancelled today due to medical issues with patient which prohibited therapy  ___ Treatment cancelled today due to patient receiving procedure or test   ___ Treatment cancelled today due to patient's refusal to participate   _X_ Treatment cancelled today due to pt's request to rest this afternoon,  "I already walked earlier with the nurses"  Felecia Shelling  PTA [facility]  Acute  Rehab Pager      [phone]

## 2013-11-24 NOTE — Progress Notes (Signed)
[facility] PROGRESS NOTE  [name] ZOX:[id number] DOB: 06/12/1946 DOA: 11/22/2013 PCP: [name], MD  Assessment/Plan: #1 acute encephalopathy  - Resolved and most likely due to polypharmacy. Patient initially placed on broad spectrum IV antibiotics.   #2 probable healthcare associated pneumonia  - Pneumonia as reported was the same area where lung cancer is at her oncologist note. As such will discontinue IV antibiotics and place on oral antibiotics. Patient is afebrile with normal respiratory rate  #3 COPD  Stable. Continue scheduled nebulizers. Continue IV antibiotics for problem #2.   #4 anxiety  Continue Xanax as needed.  - Given that mental status is at baseline we'll not decrease chronic Xanax dose  #5 chronic pain  Oxycodone as needed. Resume muscle relaxants. If patient were to develop confusion we'll plan on decreasing pain medication versus muscle relaxants   #6 hypertension  - Continue atenolol to 50 mg daily and low-dose Norvasc. - Blood pressure relatively well controlled  #7 locally advanced likely stage IIIa non-small cell lung cancer, squamous cell lung cancer  Patient is receiving concurrent chemoradiation. Oncology is aware of patient's admission. Per oncology.   #8 depression  Stable. Continue Zoloft.   #9 prophylaxis  PPI for GI prophylaxis. Lovenox for DVT prophylaxis.   Code Status: Full Family Communication: No family at bedside Disposition Plan: Pending improvement in respiratory condition, possible discharge home next a.m. with continued improvement   Consultants:  Oncology: Dr. Arbutus Ped  Procedures:  None  Antibiotics:  Was started initially on IV broad spectrum antibiotic cefepime and vancomycin DC 11/24/2013  Will start patient on Levaquin 11/24/2013  HPI/Subjective: Obtain call from nursing requesting cream for vaginal bump. Patient would not let's examine her or nurse evaluate bump. As such recommended patient  continue home regimen once discharged  Objective: Filed Vitals:   11/24/13 1332  BP: 140/65  Pulse: 72  Temp: 99 F (37.2 C)  Resp: 20    Intake/Output Summary (Last 24 hours) at 11/24/13 1646 Last data filed at 11/24/13 1404  Gross per 24 hour  Intake 2118.5 ml  Output   2000 ml  Net  118.5 ml   Filed Weights   11/22/13 1900  Weight: 81.693 kg (180 lb 1.6 oz)    Exam:   General:  Pt in NAD, Alert and Awake, appears older than stated age  Cardiovascular: RRR, no MRG  Respiratory: expiratory wheezes faint, Chest rise equal BL, no increased WOB  Abdomen: soft, NT, ND  Musculoskeletal: no cyanosis or clubbing   Data Reviewed: Basic Metabolic Panel:  Recent Labs Lab 11/22/13 1224 11/22/13 1951 11/23/13 0517 11/24/13 0504  NA 138  --  134* 138  K 4.0  --  4.3 3.6  CL  --   --  99 103  CO2 27  --  25 26  GLUCOSE 72  --  155* 99  BUN 6.2*  --  8 13  CREATININE 0.6 0.71 0.53 0.71  CALCIUM 8.7  --  8.7 8.5  MG  --  1.7  --   --    Liver Function Tests:  Recent Labs Lab 11/22/13 1224 11/23/13 0517 11/24/13 0504  AST 29 23 19   ALT 107* 82* 56*  ALKPHOS 196* 184* 151*  BILITOT 0.38 0.4 0.3  PROT 6.5 6.7 5.9*  ALBUMIN 3.0* 3.1* 2.8*   No results found for this basename: LIPASE, AMYLASE,  in the last 168 hours No results found for this basename: AMMONIA,  in the last 168 hours CBC:  Recent Labs Lab 11/22/13 1223 11/22/13 1951 11/23/13 0517 11/24/13 0504  WBC 4.3 3.2* 3.1* 3.4*  NEUTROABS 3.4  --  2.9 2.6  HGB 11.0* 11.7* 10.8* 9.8*  HCT 34.1* 35.4* 32.9* 30.0*  MCV 93.2 92.9 92.4 92.6  PLT 133* 147* 132* 120*   Cardiac Enzymes: No results found for this basename: CKTOTAL, CKMB, CKMBINDEX, TROPONINI,  in the last 168 hours BNP (last 3 results) No results found for this basename: PROBNP,  in the last 8760 hours CBG: No results found for this basename: GLUCAP,  in the last 168 hours  Recent Results (from the past 240 hour(s))  CULTURE,  BLOOD (ROUTINE X 2)     Status: None   Collection Time    11/22/13  5:45 PM      Result Value Range Status   Specimen Description BLOOD LEFT ANTECUBITAL   Final   Special Requests BOTTLES DRAWN AEROBIC AND ANAEROBIC 5 CC EA   Final   Culture  Setup Time     Final   Value: 11/23/2013 01:08     Performed at [facility]   Culture     Final   Value:        BLOOD CULTURE RECEIVED NO GROWTH TO DATE CULTURE WILL BE HELD FOR 5 DAYS BEFORE ISSUING A FINAL NEGATIVE REPORT     Performed at [facility]   Report Status PENDING   Incomplete  CULTURE, BLOOD (ROUTINE X 2)     Status: None   Collection Time    11/22/13  6:39 PM      Result Value Range Status   Specimen Description BLOOD RIGHT ANTECUBITAL   Final   Special Requests BOTTLES DRAWN AEROBIC AND ANAEROBIC 5 CC EA   Final   Culture  Setup Time     Final   Value: 11/23/2013 01:09     Performed at [facility]   Culture     Final   Value:        BLOOD CULTURE RECEIVED NO GROWTH TO DATE CULTURE WILL BE HELD FOR 5 DAYS BEFORE ISSUING A FINAL NEGATIVE REPORT     Performed at [facility]   Report Status PENDING   Incomplete  URINE CULTURE     Status: None   Collection Time    11/22/13  8:12 PM      Result Value Range Status   Specimen Description URINE, RANDOM   Final   Special Requests NONE   Final   Culture  Setup Time     Final   Value: 11/23/2013 05:17     Performed at [facility] Count     Final   Value: NO GROWTH     Performed at [facility]   Culture     Final   Value: NO GROWTH     Performed at [facility]   Report Status 11/24/2013 FINAL   Final     Studies: Dg Chest 2 View  11/22/2013   CLINICAL DATA:  Cough, productive sputum  EXAM: CHEST  2 VIEW  COMPARISON:  09/04/2013  FINDINGS: Cardiomediastinal silhouette is stable. There is hazy airspace is in right lower lobe medially highly suspicious for pneumonia. Follow-up to resolution after appropriate  treatment is recommended. Osteopenia and mild degenerative changes lower thoracic spine. Again noted poorly visualized mass in right lower lobe.  IMPRESSION: Hazy airspace is in right lower lobe medially highly suspicious for pneumonia. Follow-up to resolution  after appropriate treatment is recommended. Poorly visualized mass in right lower lobe again noted.   Electronically Signed   By: Natasha Mead M.D.   On: 11/22/2013 17:08   Ct Head Wo Contrast  11/22/2013   CLINICAL DATA:  Altered mental status, history of lung cancer  EXAM: CT HEAD WITHOUT CONTRAST  TECHNIQUE: Contiguous axial images were obtained from the base of the skull through the vertex without intravenous contrast.  COMPARISON:  10/07/2013  FINDINGS: No skull fracture is noted. Paranasal sinuses and mastoid air cells are unremarkable.  No intracranial hemorrhage, mass effect or midline shift. Ventricular size is stable from prior exam. No acute infarction. No mass lesion is noted on this unenhanced scan. Stable mild cerebral atrophy.  IMPRESSION: No acute intracranial abnormality.  Stable mild cerebral atrophy.   Electronically Signed   By: Natasha Mead M.D.   On: 11/22/2013 17:11    Scheduled Meds: . albuterol  2.5 mg Nebulization Q6H WA  . amLODipine  5 mg Oral Daily  . atenolol  50 mg Oral Daily  . ceFEPime (MAXIPIME) IV  1 g Intravenous Q8H  . enoxaparin (LOVENOX) injection  40 mg Subcutaneous Q24H  . guaiFENesin  1,200 mg Oral BID  . ipratropium  0.5 mg Nebulization Q6H WA  . nicotine  14 mg Transdermal Daily  . pantoprazole  80 mg Oral Q1200  . sertraline  200 mg Oral Daily  . sucralfate  1 g Oral QID  . trazodone  300 mg Oral QHS  . vancomycin  1,000 mg Intravenous Q12H   Continuous Infusions: . sodium chloride 75 mL/hr at 11/23/13 2102    Principal Problem:   Acute encephalopathy Active Problems:   HTN (hypertension)   Depression   Anxiety   COPD GOLD III   Arthralgia of hip   Chronic pain   Right lower lobe lung  mass, Sq cell Ca ? IIIB vs IV   Lung cancer   Malignant neoplasm of lower lobe, bronchus, or lung   Hospital acquired PNA    Time spent: More than 35 minutes    Penny Pia  [facility] Pager [phone]. If 7PM-7AM, please contact night-coverage at [email or web address], password [facility] 11/24/2013, 4:46 PM  LOS: 2 days

## 2013-11-24 NOTE — Progress Notes (Addendum)
ANTIBIOTIC CONSULT NOTE - FOLLOW UP  Pharmacy Consult for:  Levaquin  Indication:  Probable healthcare-associated pneumonia  Allergies  Allergen Reactions  . Prednisone     insomnia  . Nsaids     GI Upset  . Aspirin Other (See Comments)    Gi symptoms. Does NOT take ibuprofen or other NSAIDS  . Dulera [Mometasone Furo-Formoterol Fum] Palpitations    Panting " my body was twisted inside and out"    Patient Measurements: Height: 5' 6.14" (168 cm) Weight: 180 lb 1.6 oz (81.693 kg) IBW/kg (Calculated) : 59.63  Vital Signs: Temp: 99 F (37.2 C) (11/26 1332) Temp src: Oral (11/26 1332) BP: 140/65 mmHg (11/26 1332) Pulse Rate: 72 (11/26 1332) Intake/Output from previous day: 11/25 0701 - 11/26 0700 In: 3178.5 [P.O.:1626; I.V.:1202.5; IV Piggyback:350] Out: 3150 [Urine:3150] Labs:  Recent Labs  11/22/13 1951 11/23/13 0517 11/24/13 0504  WBC 3.2* 3.1* 3.4*  HGB 11.7* 10.8* 9.8*  PLT 147* 132* 120*  CREATININE 0.71 0.53 0.71   Estimated Creatinine Clearance: 74.7 ml/min (by C-G formula based on Cr of 0.71).    Microbiology: Recent Results (from the past 720 hour(s))  CULTURE, BLOOD (ROUTINE X 2)     Status: None   Collection Time    11/22/13  5:45 PM      Result Value Range Status   Specimen Description BLOOD LEFT ANTECUBITAL   Final   Special Requests BOTTLES DRAWN AEROBIC AND ANAEROBIC 5 CC EA   Final   Culture  Setup Time     Final   Value: 11/23/2013 01:08     Performed at [facility]   Culture     Final   Value:        BLOOD CULTURE RECEIVED NO GROWTH TO DATE CULTURE WILL BE HELD FOR 5 DAYS BEFORE ISSUING A FINAL NEGATIVE REPORT     Performed at [facility]   Report Status PENDING   Incomplete  CULTURE, BLOOD (ROUTINE X 2)     Status: None   Collection Time    11/22/13  6:39 PM      Result Value Range Status   Specimen Description BLOOD RIGHT ANTECUBITAL   Final   Special Requests BOTTLES DRAWN AEROBIC AND ANAEROBIC 5 CC EA   Final   Culture  Setup Time     Final   Value: 11/23/2013 01:09     Performed at [facility]   Culture     Final   Value:        BLOOD CULTURE RECEIVED NO GROWTH TO DATE CULTURE WILL BE HELD FOR 5 DAYS BEFORE ISSUING A FINAL NEGATIVE REPORT     Performed at [facility]   Report Status PENDING   Incomplete  URINE CULTURE     Status: None   Collection Time    11/22/13  8:12 PM      Result Value Range Status   Specimen Description URINE, RANDOM   Final   Special Requests NONE   Final   Culture  Setup Time     Final   Value: 11/23/2013 05:17     Performed at [facility] Count     Final   Value: NO GROWTH     Performed at [facility]   Culture     Final   Value: NO GROWTH     Performed at [facility]   Report Status 11/24/2013 FINAL   Final  Anti-infectives   Start     Dose/Rate Route Frequency Ordered Stop   11/23/13 0600  vancomycin (VANCOCIN) IVPB 1000 mg/200 mL premix  Status:  Discontinued     1,000 mg 200 mL/hr over 60 Minutes Intravenous Every 12 hours 11/22/13 1856 11/24/13 1722   11/22/13 2200  ceFEPIme (MAXIPIME) 1 g in dextrose 5 % 50 mL IVPB  Status:  Discontinued     1 g 100 mL/hr over 30 Minutes Intravenous 3 times per day 11/22/13 1900 11/22/13 1915   11/22/13 2200  ceFEPIme (MAXIPIME) 1 g in dextrose 5 % 50 mL IVPB  Status:  Discontinued     1 g 100 mL/hr over 30 Minutes Intravenous 3 times per day 11/22/13 1856 11/24/13 1720   11/22/13 2000  vancomycin (VANCOCIN) 1,500 mg in sodium chloride 0.9 % 500 mL IVPB     1,500 mg 250 mL/hr over 120 Minutes Intravenous  Once 11/22/13 1856 11/22/13 2149   11/22/13 1830  ceFEPIme (MAXIPIME) 1 g in dextrose 5 % 50 mL IVPB     1 g 100 mL/hr over 30 Minutes Intravenous STAT 11/22/13 1754 11/22/13 1856      Assessment:  Asked to assist with oral Levaquin therapy for this 67 year-old female with suspected HCAP.    [name] has been receiving IV Vancomycin and Cefepime for  the same indication.  Anticipating discharge home soon, possibly tomorrow.  Goals of Therapy:   Dose appropriate for renal function  Eradication of infection  Plan:   Levaquin 750 mg every 24 hours.  Dosage adjustment is not needed while the creatinine clearance is above 50 ml/min.  This antibiotic should be taken 2 hours before taking iron, vitamins with minerals, and Carafate.  Polo Riley R.Ph. 11/24/2013 5:44 PM

## 2013-11-25 DIAGNOSIS — G8929 Other chronic pain: Secondary | ICD-10-CM

## 2013-11-25 DIAGNOSIS — F329 Major depressive disorder, single episode, unspecified: Secondary | ICD-10-CM

## 2013-11-25 MED ORDER — AMLODIPINE BESYLATE 5 MG PO TABS: 5.0000 mg | ORAL_TABLET | Freq: Every day | ORAL | Status: DC

## 2013-11-25 MED ORDER — OXYCODONE HCL 5 MG PO TABS: 5.0000 mg | ORAL_TABLET | Freq: Four times a day (QID) | ORAL | Status: DC | PRN

## 2013-11-25 MED ORDER — LEVOFLOXACIN 750 MG PO TABS: 750.0000 mg | ORAL_TABLET | Freq: Every day | ORAL | Status: DC

## 2013-11-25 MED ORDER — ALPRAZOLAM 1 MG PO TABS: 1.0000 mg | ORAL_TABLET | Freq: Four times a day (QID) | ORAL | Status: DC | PRN

## 2013-11-25 MED ORDER — ATENOLOL 50 MG PO TABS: 50.0000 mg | ORAL_TABLET | Freq: Every day | ORAL | Status: DC

## 2013-11-25 NOTE — Discharge Summary (Signed)
Physician Discharge Summary  [name] ZOX:[id number] DOB: April 01, 1946 DOA: 11/22/2013  PCP: [name], MD  Admit date: 11/22/2013 Discharge date: 11/25/2013  Time spent: > 35 minutes  Recommendations for Outpatient Follow-up:  1) f/u with oncologist in 1-2 weeks 2. Blood pressure medication adjusted while in house. Reassess blood pressures  Discharge Diagnoses:  Principal Problem:   Acute encephalopathy Active Problems:   HTN (hypertension)   Depression   Anxiety   COPD GOLD III   Arthralgia of hip   Chronic pain   Right lower lobe lung mass, Sq cell Ca ? IIIB vs IV   Lung cancer   Malignant neoplasm of lower lobe, bronchus, or lung   Hospital acquired PNA   Discharge Condition: stable  Diet recommendation: Low sodium heart healthy  Filed Weights   11/22/13 1900  Weight: 81.693 kg (180 lb 1.6 oz)    History of present illness:  From original HPI 67 y/o With history of hypertension, history of CVA x3, anxiety, PTSD, depression, COPD, locally advanced likely stage IIIa non-small cell lung cancer, squamous cell carcinoma diagnosed in September of 2014 concurrently on chemoradiation with weekly carboplatin for AUC of 2 and paclitaxel 45mg /M2, status post 3 cycles who presents to the ED with confusion after receiving chemotherapy and radiation. Patient is somewhat confused and a such history was obtained from the son and from the ED physician.    Hospital Course:   #1 acute encephalopathy  - Resolved and most likely due to polypharmacy. Patient initially placed on broad spectrum IV antibiotics.  - improved after lowering benzodiazepine dose and opiod dose.  #2 probable healthcare associated pneumonia  - Pneumonia as reported was the same area where lung cancer is at her oncologist note. As such will discontinue IV antibiotics and place on oral antibiotics. Patient is afebrile with normal respiratory rate  - Mentation back to baseline and will plan on d/c home on  levaquin  #3 COPD  Stable. Continue home regimen.  #4 anxiety  Continue Xanax as needed at lower dose on d/c.   #5 chronic pain  Oxycodone as needed for pain at lower dose. Resume muscle relaxants.  #6 hypertension  - Continue atenolol to 50 mg daily and low-dose Norvasc.   #7 locally advanced likely stage IIIa non-small cell lung cancer, squamous cell lung cancer  Patient is receiving concurrent chemoradiation. Oncology is aware of patient's admission. Recommend routine f/u with oncologist for # 7.   #8 depression  Stable. Continue Zoloft.   Procedures:  CT of head  Consultations:  Oncologist Dr. Arbutus Ped  Discharge Exam: Filed Vitals:   11/25/13 0931  BP: 134/67  Pulse: 97  Temp: 99 F (37.2 C)  Resp: 20    General: Pt in NAD, Alert and awake Cardiovascular: RRR, no MRG Respiratory: CTA BL, no wheezes  Discharge Instructions  Discharge Orders   Future Appointments Provider Department Dept Phone   11/29/2013 11:25 AM [facility] [place] [facility] RADIATION ONCOLOGY [phone]   11/29/2013 11:45 AM Windell [facility] [phone]   11/29/2013 12:15 PM Marlana Salvage [facility] [phone]   11/29/2013 1:15 PM [facility] [place] [facility] MEDICAL ONCOLOGY [phone]   11/30/2013 4:25 PM [facility] [place] [facility] RAD[facility]ATION ONCOLOGY [phone]   12/01/2013 11:25 AM [facility] [place] [facility] RAD[facility] ONCOLOG[facility] [phone]   12/02/2013 11:30 AM [facility] [place] [facility]  RADIATION ONCOLOGY [phone]   12/03/2013 11:25 AM [facility] [place] [facility] RADIATION ONCOLOGY [phone]   12/06/2013 11:45 AM Krista Blue [facility] MEDICAL ONCOLOGY [phone]   12/13/2013 11:45 AM Krista Blue [facility] MEDICAL ONCOLOGY [phone]   12/16/2013  2:30 PM Laurette Schimke, MD [place] [facility] GYNECOLOGICAL ONCOLOGY [phone]   12/20/2013 11:45 AM Krista Blue [facility] MEDICAL ONCOLOGY [phone]   Future Orders Complete By Expires   Call MD for:  difficulty breathing, headache or visual disturbances  As directed    Call MD for:  temperature >100.4  As directed    Diet - low sodium heart healthy  As directed    Discharge instructions  As directed    Comments:     Please be sure to follow up with your pcp in 1-2 weeks or sooner should any new concerns arise.  Also follow up with your oncologist in 1-2 weeks   Increase activity slowly  As directed        Medication List    STOP taking these medications       amoxicillin-clavulanate 875-125 MG per tablet  Commonly known as:  AUGMENTIN     HYDROcodone-acetaminophen 10-325 MG per tablet  Commonly known as:  NORCO     ibuprofen 200 MG tablet  Commonly known as:  ADVIL,MOTRIN      TAKE these medications       albuterol 108 (90 BASE) MCG/ACT inhaler  Commonly known as:  VENTOLIN HFA  Inhale 2 puffs into the lungs every 6 (six) hours as needed for wheezing or shortness of breath.     albuterol (2.5 MG/3ML) 0.083% nebulizer solution  Commonly known as:  PROVENTIL  Take 2.5 mg by nebulization as needed for wheezing.     ALPRAZolam 1 MG tablet  Commonly known as:  XANAX  Take 1 tablet (1 mg total) by mouth every 6 (six) hours as needed for anxiety.     amLODipine 5 MG tablet  Commonly known as:  NORVASC  Take 1 tablet (5 mg total) by mouth daily.     atenolol 50 MG tablet  Commonly known as:  TENORMIN  Take 1 tablet (50 mg total) by mouth daily.     benzonatate 200 MG capsule  Commonly known as:  TESSALON  Take 200 mg by mouth 2 (two) times daily as needed for cough.     chlorzoxazone 500 MG tablet  Commonly known as:  PARAFON  Take 500 mg by mouth 4 (four) times daily as needed for muscle spasms.     dextromethorphan-guaiFENesin  30-600 MG per 12 hr tablet  Commonly known as:  MUCINEX DM  Take 1 tablet by mouth every 12 (twelve) hours.     diphenhydrAMINE 25 mg capsule  Commonly known as:  BENADRYL  Take 25 mg by mouth 2 (two) times daily.     esomeprazole 40 MG capsule  Commonly known as:  NEXIUM  Take 40 mg by mouth daily before breakfast.     levofloxacin 750 MG tablet  Commonly known as:  LEVAQUIN  Take 1 tablet (750 mg total) by mouth daily.     nicotine 14 mg/24hr patch  Commonly known as:  NICODERM CQ - dosed in mg/24 hours  Place 1 patch onto the skin daily.     oxyCODONE 5 MG immediate release tablet  Commonly known as:  Oxy IR/ROXICODONE  Take 1 tablet (5 mg total)  by mouth every 6 (six) hours as needed for severe pain.     prochlorperazine 10 MG tablet  Commonly known as:  COMPAZINE  Take 10 mg by mouth every 6 (six) hours as needed for nausea.     sertraline 100 MG tablet  Commonly known as:  ZOLOFT  Take 200 mg by mouth daily.     sucralfate 1 G tablet  Commonly known as:  CARAFATE  Take 1 tablet (1 g total) by mouth 4 (four) times daily.     trazodone 300 MG tablet  Commonly known as:  DESYREL  Take 300 mg by mouth at bedtime.       Allergies  Allergen Reactions  . Prednisone     insomnia  . Nsaids     GI Upset  . Aspirin Other (See Comments)    Gi symptoms. Does NOT take ibuprofen or other NSAIDS  . Dulera [Mometasone Furo-Formoterol Fum] Palpitations    Panting " my body was twisted inside and out"      The results of significant diagnostics from this hospitalization (including imaging, microbiology, ancillary and laboratory) are listed below for reference.    Significant Diagnostic Studies: Dg Chest 2 View  11/22/2013   CLINICAL DATA:  Cough, productive sputum  EXAM: CHEST  2 VIEW  COMPARISON:  09/04/2013  FINDINGS: Cardiomediastinal silhouette is stable. There is hazy airspace is in right lower lobe medially highly suspicious for pneumonia. Follow-up to resolution  after appropriate treatment is recommended. Osteopenia and mild degenerative changes lower thoracic spine. Again noted poorly visualized mass in right lower lobe.  IMPRESSION: Hazy airspace is in right lower lobe medially highly suspicious for pneumonia. Follow-up to resolution after appropriate treatment is recommended. Poorly visualized mass in right lower lobe again noted.   Electronically Signed   By: Natasha Mead M.D.   On: 11/22/2013 17:08   Ct Head Wo Contrast  11/22/2013   CLINICAL DATA:  Altered mental status, history of lung cancer  EXAM: CT HEAD WITHOUT CONTRAST  TECHNIQUE: Contiguous axial images were obtained from the base of the skull through the vertex without intravenous contrast.  COMPARISON:  10/07/2013  FINDINGS: No skull fracture is noted. Paranasal sinuses and mastoid air cells are unremarkable.  No intracranial hemorrhage, mass effect or midline shift. Ventricular size is stable from prior exam. No acute infarction. No mass lesion is noted on this unenhanced scan. Stable mild cerebral atrophy.  IMPRESSION: No acute intracranial abnormality.  Stable mild cerebral atrophy.   Electronically Signed   By: Natasha Mead M.D.   On: 11/22/2013 17:11    Microbiology: Recent Results (from the past 240 hour(s))  CULTURE, BLOOD (ROUTINE X 2)     Status: None   Collection Time    11/22/13  5:45 PM      Result Value Range Status   Specimen Description BLOOD LEFT ANTECUBITAL   Final   Special Requests BOTTLES DRAWN AEROBIC AND ANAEROBIC 5 CC EA   Final   Culture  Setup Time     Final   Value: 11/23/2013 01:08     Performed at [facility]   Culture     Final   Value:        BLOOD CULTURE RECEIVED NO GROWTH TO DATE CULTURE WILL BE HELD FOR 5 DAYS BEFORE ISSUING A FINAL NEGATIVE REPORT     Performed at [facility]   Report Status PENDING   Incomplete  CULTURE, BLOOD (ROUTINE X 2)  Status: None   Collection Time    11/22/13  6:39 PM      Result Value Range Status    Specimen Description BLOOD RIGHT ANTECUBITAL   Final   Special Requests BOTTLES DRAWN AEROBIC AND ANAEROBIC 5 CC EA   Final   Culture  Setup Time     Final   Value: 11/23/2013 01:09     Performed at [facility]   Culture     Final   Value:        BLOOD CULTURE RECEIVED NO GROWTH TO DATE CULTURE WILL BE HELD FOR 5 DAYS BEFORE ISSUING A FINAL NEGATIVE REPORT     Performed at [facility]   Report Status PENDING   Incomplete  URINE CULTURE     Status: None   Collection Time    11/22/13  8:12 PM      Result Value Range Status   Specimen Description URINE, RANDOM   Final   Special Requests NONE   Final   Culture  Setup Time     Final   Value: 11/23/2013 05:17     Performed at [facility] Count     Final   Value: NO GROWTH     Performed at [facility]   Culture     Final   Value: NO GROWTH     Performed at [facility]   Report Status 11/24/2013 FINAL   Final     Labs: Basic Metabolic Panel:  Recent Labs Lab 11/22/13 1224 11/22/13 1951 11/23/13 0517 11/24/13 0504  NA 138  --  134* 138  K 4.0  --  4.3 3.6  CL  --   --  99 103  CO2 27  --  25 26  GLUCOSE 72  --  155* 99  BUN 6.2*  --  8 13  CREATININE 0.6 0.71 0.53 0.71  CALCIUM 8.7  --  8.7 8.5  MG  --  1.7  --   --    Liver Function Tests:  Recent Labs Lab 11/22/13 1224 11/23/13 0517 11/24/13 0504  AST 29 23 19   ALT 107* 82* 56*  ALKPHOS 196* 184* 151*  BILITOT 0.38 0.4 0.3  PROT 6.5 6.7 5.9*  ALBUMIN 3.0* 3.1* 2.8*   No results found for this basename: LIPASE, AMYLASE,  in the last 168 hours No results found for this basename: AMMONIA,  in the last 168 hours CBC:  Recent Labs Lab 11/22/13 1223 11/22/13 1951 11/23/13 0517 11/24/13 0504  WBC 4.3 3.2* 3.1* 3.4*  NEUTROABS 3.4  --  2.9 2.6  HGB 11.0* 11.7* 10.8* 9.8*  HCT 34.1* 35.4* 32.9* 30.0*  MCV 93.2 92.9 92.4 92.6  PLT 133* 147* 132* 120*   Cardiac Enzymes: No results found for this  basename: CKTOTAL, CKMB, CKMBINDEX, TROPONINI,  in the last 168 hours BNP: BNP (last 3 results) No results found for this basename: PROBNP,  in the last 8760 hours CBG: No results found for this basename: GLUCAP,  in the last 168 hours     Signed:  Penny Pia  [facility] 11/25/2013, 2:37 PM   \

## 2013-11-25 NOTE — Plan of Care (Signed)
Problem: Discharge Progression Outcomes Goal: Flu vaccine received if indicated Outcome: Not Applicable Date Met:  11/25/13 Pt on chemo and radiation

## 2013-11-29 ENCOUNTER — Ambulatory Visit
Admission: RE | Admit: 2013-11-29 | Discharge: 2013-11-29 | Disposition: A | Discharge status: Home / Self Care | Payer: Medicare Other | Source: Ambulatory Visit | Attending: Radiation Oncology | Admitting: Radiation Oncology

## 2013-11-29 ENCOUNTER — Encounter: Payer: Self-pay | Admitting: Physician Assistant

## 2013-11-29 ENCOUNTER — Other Ambulatory Visit: Payer: Self-pay | Admitting: Radiation Oncology

## 2013-11-29 ENCOUNTER — Ambulatory Visit: Payer: Medicare Other

## 2013-11-29 ENCOUNTER — Telehealth: Payer: Self-pay | Admitting: Internal Medicine

## 2013-11-29 ENCOUNTER — Ambulatory Visit (HOSPITAL_BASED_OUTPATIENT_CLINIC_OR_DEPARTMENT_OTHER): Payer: Medicare Other

## 2013-11-29 ENCOUNTER — Other Ambulatory Visit (HOSPITAL_BASED_OUTPATIENT_CLINIC_OR_DEPARTMENT_OTHER): Payer: Medicare Other

## 2013-11-29 ENCOUNTER — Other Ambulatory Visit: Payer: Medicare Other

## 2013-11-29 ENCOUNTER — Encounter: Payer: Self-pay | Admitting: Radiation Oncology

## 2013-11-29 ENCOUNTER — Ambulatory Visit (HOSPITAL_BASED_OUTPATIENT_CLINIC_OR_DEPARTMENT_OTHER): Payer: Medicare Other | Admitting: Physician Assistant

## 2013-11-29 DIAGNOSIS — M25569 Pain in unspecified knee: Secondary | ICD-10-CM

## 2013-11-29 DIAGNOSIS — C3491 Malignant neoplasm of unspecified part of right bronchus or lung: Secondary | ICD-10-CM

## 2013-11-29 DIAGNOSIS — R059 Cough, unspecified: Secondary | ICD-10-CM

## 2013-11-29 DIAGNOSIS — C3430 Malignant neoplasm of lower lobe, unspecified bronchus or lung: Secondary | ICD-10-CM

## 2013-11-29 DIAGNOSIS — R05 Cough: Secondary | ICD-10-CM

## 2013-11-29 DIAGNOSIS — M549 Dorsalgia, unspecified: Secondary | ICD-10-CM

## 2013-11-29 DIAGNOSIS — R079 Chest pain, unspecified: Secondary | ICD-10-CM

## 2013-11-29 DIAGNOSIS — Z5111 Encounter for antineoplastic chemotherapy: Secondary | ICD-10-CM

## 2013-11-29 LAB — COMPREHENSIVE METABOLIC PANEL (CC13)
ALT: 40 U/L (ref 0–55)
AST: 24 U/L (ref 5–34)
Albumin: 3 g/dL — ABNORMAL LOW (ref 3.5–5.0)
Anion Gap: 7 mEq/L (ref 3–11)
CO2: 28 mEq/L (ref 22–29)
Calcium: 8.9 mg/dL (ref 8.4–10.4)
Chloride: 105 mEq/L (ref 98–109)
Potassium: 4 mEq/L (ref 3.5–5.1)
Sodium: 140 mEq/L (ref 136–145)
Total Bilirubin: 0.33 mg/dL (ref 0.20–1.20)
Total Protein: 6.2 g/dL — ABNORMAL LOW (ref 6.4–8.3)

## 2013-11-29 LAB — CBC WITH DIFFERENTIAL/PLATELET
BASO%: 1 % (ref 0.0–2.0)
EOS%: 1.6 % (ref 0.0–7.0)
HCT: 32.3 % — ABNORMAL LOW (ref 34.8–46.6)
MCHC: 32.5 g/dL (ref 31.5–36.0)
MONO#: 0.3 10*3/uL (ref 0.1–0.9)
NEUT#: 2.2 10*3/uL (ref 1.5–6.5)
NEUT%: 70.3 % (ref 38.4–76.8)
Platelets: 148 10*3/uL (ref 145–400)
RBC: 3.48 10*6/uL — ABNORMAL LOW (ref 3.70–5.45)
RDW: 16.9 % — ABNORMAL HIGH (ref 11.2–14.5)
WBC: 3.1 10*3/uL — ABNORMAL LOW (ref 3.9–10.3)
lymph#: 0.6 10*3/uL — ABNORMAL LOW (ref 0.9–3.3)

## 2013-11-29 LAB — CULTURE, BLOOD (ROUTINE X 2): Culture: NO GROWTH

## 2013-11-29 MED ORDER — ONDANSETRON 16 MG/50ML IVPB (CHCC)
INTRAVENOUS | Status: AC
  Filled 2013-11-29: qty 16

## 2013-11-29 MED ORDER — DIPHENHYDRAMINE HCL 50 MG/ML IJ SOLN
INTRAMUSCULAR | Status: AC
  Filled 2013-11-29: qty 1

## 2013-11-29 MED ORDER — FAMOTIDINE IN NACL 20-0.9 MG/50ML-% IV SOLN
20.0000 mg | Freq: Once | INTRAVENOUS | Status: AC
  Administered 2013-11-29: 20 mg via INTRAVENOUS

## 2013-11-29 MED ORDER — SODIUM CHLORIDE 0.9 % IV SOLN
189.8000 mg | Freq: Once | INTRAVENOUS | Status: AC
  Administered 2013-11-29: 190 mg via INTRAVENOUS
  Filled 2013-11-29: qty 19

## 2013-11-29 MED ORDER — OXYCODONE-ACETAMINOPHEN 5-325 MG PO TABS
1.0000 | ORAL_TABLET | ORAL | Status: AC
  Administered 2013-11-29: 1 via ORAL

## 2013-11-29 MED ORDER — OXYCODONE-ACETAMINOPHEN 10-325 MG PO TABS: 1.0000 | ORAL_TABLET | Freq: Four times a day (QID) | ORAL | Status: DC | PRN

## 2013-11-29 MED ORDER — SODIUM CHLORIDE 0.9 % IV SOLN
Freq: Once | INTRAVENOUS | Status: AC
  Administered 2013-11-29: 15:00:00 via INTRAVENOUS

## 2013-11-29 MED ORDER — OXYCODONE-ACETAMINOPHEN 5-325 MG PO TABS
ORAL_TABLET | ORAL | Status: AC
  Filled 2013-11-29: qty 1

## 2013-11-29 MED ORDER — DEXAMETHASONE SODIUM PHOSPHATE 20 MG/5ML IJ SOLN
INTRAMUSCULAR | Status: AC
  Filled 2013-11-29: qty 5

## 2013-11-29 MED ORDER — ONDANSETRON 16 MG/50ML IVPB (CHCC)
16.0000 mg | Freq: Once | INTRAVENOUS | Status: AC
  Administered 2013-11-29: 16 mg via INTRAVENOUS

## 2013-11-29 MED ORDER — DIPHENHYDRAMINE HCL 50 MG/ML IJ SOLN
50.0000 mg | Freq: Once | INTRAMUSCULAR | Status: AC
  Administered 2013-11-29: 50 mg via INTRAVENOUS

## 2013-11-29 MED ORDER — FAMOTIDINE IN NACL 20-0.9 MG/50ML-% IV SOLN
INTRAVENOUS | Status: AC
  Filled 2013-11-29: qty 50

## 2013-11-29 MED ORDER — DEXAMETHASONE SODIUM PHOSPHATE 20 MG/5ML IJ SOLN
20.0000 mg | Freq: Once | INTRAMUSCULAR | Status: AC
  Administered 2013-11-29: 20 mg via INTRAVENOUS

## 2013-11-29 MED ORDER — PACLITAXEL CHEMO INJECTION 300 MG/50ML
45.0000 mg/m2 | Freq: Once | INTRAVENOUS | Status: AC
  Administered 2013-11-29: 84 mg via INTRAVENOUS
  Filled 2013-11-29: qty 14

## 2013-11-29 NOTE — Telephone Encounter (Signed)
Gave pt appt for lab and Md for December and january 2015

## 2013-11-29 NOTE — Progress Notes (Signed)
Weekly rad txs 29/33 completed, d/c hospital 11/25/13, completed  levoquin for pneumonia,. Coughing up yellowish pgeglm still, has augmentin at home in case , requesting refill pm hydrocodone /apap 10/325mg , c/o chest discomfort from the pneumonia, has chemotherapy infusion today with labs 12:02 PM

## 2013-11-29 NOTE — Patient Instructions (Signed)
[facility] Discharge Instructions for Patients Receiving Chemotherapy  Today you received the following chemotherapy agents: Taxol, Carboplatin.  To help prevent nausea and vomiting after your treatment, we encourage you to take your nausea medication.   If you develop nausea and vomiting that is not controlled by your nausea medication, call the clinic.   BELOW ARE SYMPTOMS THAT SHOULD BE REPORTED IMMEDIATELY:  *FEVER GREATER THAN 100.5 F  *CHILLS WITH OR WITHOUT FEVER  NAUSEA AND VOMITING THAT IS NOT CONTROLLED WITH YOUR NAUSEA MEDICATION  *UNUSUAL SHORTNESS OF BREATH  *UNUSUAL BRUISING OR BLEEDING  TENDERNESS IN MOUTH AND THROAT WITH OR WITHOUT PRESENCE OF ULCERS  *URINARY PROBLEMS  *BOWEL PROBLEMS  UNUSUAL RASH Items with * indicate a potential emergency and should be followed up as soon as possible.  Feel free to call the clinic you have any questions or concerns. The clinic phone number is [phone].    

## 2013-11-29 NOTE — Progress Notes (Addendum)
[facility] Telephone:([phone]   [phone]  SHARED VISIT PROGRESS NOTE  [name], MD  [address]  [place] [address]  DIAGNOSIS: Locally advanced, likely stage IIIA (T2a., N2, M0) non-small cell lung cancer, squamous cell carcinoma diagnosed in September of 2014.   PRIOR THERAPY: None   CURRENT THERAPY: Concurrent chemoradiation with weekly carboplatin for AUC of 2 and paclitaxel 45 mg/M2, status post 6 cycles. First dose was given on 10/18/2013.   CHEMOTHERAPY INTENT: Curative/control  CURRENT # OF CHEMOTHERAPY CYCLES: 7  CURRENT ANTIEMETICS: Zofran, Compazine and dexamethasone  CURRENT SMOKING STATUS: Current smoker and she was strongly advised to quit smoking and also to smoke cessation program  ORAL CHEMOTHERAPY AND CONSENT: None  CURRENT BISPHOSPHONATES USE: None  PAIN MANAGEMENT: 0/10  NARCOTICS INDUCED CONSTIPATION: None  LIVING WILL AND CODE STATUS: Full code  INTERVAL HISTORY: JAYDALEE BARDWELL 67 y.o. female returns to the clinic today for follow up visit accompanied by her son. She status post a recent admission for pneumonia and confusion. She was discharged Thanksgiving day. She completed her course of antibiotics as prescribed. She complains of fatigue. She reports that she has some Augmentin at home that was prescribed by Dr. Sherene Sires prior to her admission. She continues to have cough that is productive of pale yellow secretions not associated by any fever or chills. She is out of her pain medication however her records show that Dr. Mitzi Hansen gave her a prescription for pain medication today. She is having some back and knee pain and requests something for pain while she is receiving her chemotherapy today. Overall she's tolerating her course of concurrent chemoradiation relatively well with the exception of some continued lower midsternum/rib cage chest pain.  She also complains of bilateral knee pain but states that she needs to have knee  replacements. She's had some nausea but this is well-controlled with her Compazine.  She denied having any significant weight loss or night sweats. The patient unfortunately continues to smoke few cigarettes every day but she is trying to quit.  MEDICAL HISTORY: Past Medical History  Diagnosis Date  . H/O: stroke 2012, 2000,     X3  . Hypertension   . Anxiety   . PTSD (post-traumatic stress disorder)   . Depression   . SVD (spontaneous vaginal delivery)     x 1  . Smoker   . Osteoarthritis     knees, hips hands,   . Shortness of breath   . Pain     SEVERE PAIN BACK, RIGHT HIP AND KNEES--HARRINGTON RODS AND CERVICAL PLATES--USES WALKER OR CANE WHEN AMBULATING - GOES TO A PAIN CLINIC-STATES SHE NEEDS RT HIP AND BOTH KNEES REPLACED. PT STATES SHE WAS TOLD THE CEMENT AROUND THE HARRINGTON RODS IS CRACKED.  [name] Hot flashes, menopausal     SEVERE  . Family history of anesthesia complication     equipment failure caused problem  . Complication of anesthesia 03/02/13    severe headache,vertigo postop  . COPD (chronic obstructive pulmonary disease)     stable  . 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 Lung cancer     ALLERGIES:  is allergic to prednisone; nsaids; aspirin; and dulera.  MEDICATIONS:  Current Outpatient Prescriptions  Medication Sig Dispense Refill  . albuterol (PROVENTIL) (2.5 MG/3ML) 0.083% nebulizer solution Take 2.5 mg by nebulization as needed for wheezing.      [name] albuterol (VENTOLIN HFA) 108 (90 BASE) MCG/ACT inhaler  Inhale 2 puffs into the lungs every 6 (six) hours as needed for wheezing or shortness of breath.  1 Inhaler  11  . ALPRAZolam (XANAX) 1 MG tablet Take 1 tablet (1 mg total) by mouth every 6 (six) hours as needed for anxiety.  30 tablet  0  . atenolol (TENORMIN) 50 MG tablet Take 1 tablet (50 mg total) by mouth daily.  30 tablet  0  . benzonatate (TESSALON) 200 MG capsule Take 200 mg by mouth 2 (two) times daily as needed for cough.      . chlorzoxazone  (PARAFON) 500 MG tablet Take 500 mg by mouth 4 (four) times daily as needed for muscle spasms.      [name] dextromethorphan-guaiFENesin (MUCINEX DM) 30-600 MG per 12 hr tablet Take 1 tablet by mouth every 12 (twelve) hours.      . diphenhydrAMINE (BENADRYL) 25 mg capsule Take 25 mg by mouth 2 (two) times daily.       [name] esomeprazole (NEXIUM) 40 MG capsule Take 40 mg by mouth daily before breakfast.      . nicotine (NICODERM CQ - DOSED IN MG/24 HOURS) 14 mg/24hr patch Place 1 patch onto the skin daily.  28 patch  0  . oxyCODONE-acetaminophen (PERCOCET) 10-325 MG per tablet Take 1 tablet by mouth every 6 (six) hours as needed for pain.  60 tablet  0  . prochlorperazine (COMPAZINE) 10 MG tablet Take 10 mg by mouth every 6 (six) hours as needed for nausea.      . sertraline (ZOLOFT) 100 MG tablet Take 200 mg by mouth daily.       . sucralfate (CARAFATE) 1 G tablet Take 1 tablet (1 g total) by mouth 4 (four) times daily.  120 tablet  2  . trazodone (DESYREL) 300 MG tablet Take 300 mg by mouth at bedtime.      [name] amLODipine (NORVASC) 5 MG tablet Take 1 tablet (5 mg total) by mouth daily.  30 tablet  0  . levofloxacin (LEVAQUIN) 750 MG tablet Take 1 tablet (750 mg total) by mouth daily.  3 tablet  0  . oxyCODONE (OXY IR/ROXICODONE) 5 MG immediate release tablet Take 1 tablet (5 mg total) by mouth every 6 (six) hours as needed for severe pain.  30 tablet  0   No current facility-administered medications for this visit.   Facility-Administered Medications Ordered in Other Visits  Medication Dose Route Frequency Provider Last Rate Last Dose  . CARBOplatin (PARAPLATIN) 190 mg in sodium chloride 0.9 % 100 mL chemo infusion  190 mg Intravenous Once Si Gaul, MD      . PACLitaxel (TAXOL) 84 mg in dextrose 5 % 250 mL chemo infusion (</= 80mg /m2)  45 mg/m2 (Treatment Plan Actual) Intravenous Once Si Gaul, MD 264 mL/hr at 11/29/13 1612 84 mg at 11/29/13 1612    SURGICAL HISTORY:  Past Surgical History   Procedure Laterality Date  . Carpal tunnel release      BILATERAL  . Nasal sinus surgery    . Lumbar fusion      L4-S1  . Cervical fusion    . Hernia repair    . Salpingoophorectomy Right   . Vaginal hysterectomy      TAH w/ ovary removal  . Transthoracic echocardiogram  12-03-2011    LVSF NORMAL/ EF 60-65%  . Vulvectomy N/A 03/02/2013    Procedure: WIDE LOCAL EXCISION VULVAR;  Surgeon: Rejeana Brock A. Duard Brady, MD;  Location: [facility];  Service: Gynecology;  Laterality: N/A;  .  Co2 laser application N/A 05/13/2013    Procedure: LASER APPLICATION OF THE VULVA;  Surgeon: Laurette Schimke, MD;  Location: [facility];  Service: Gynecology;  Laterality: N/A;  . Video bronchoscopy Bilateral 09/23/2013    Procedure: VIDEO BRONCHOSCOPY WITHOUT FLUORO;  Surgeon: Nyoka Cowden, MD;  Location: [facility];  Service: Cardiopulmonary;  Laterality: Bilateral;    REVIEW OF SYSTEMS:  A comprehensive review of systems was negative except for: Constitutional: positive for fatigue Respiratory: positive for cough and dyspnea on exertion Musculoskeletal: positive for arthralgias and back pain   PHYSICAL EXAMINATION: General appearance: alert, cooperative, fatigued and no distress Head: Normocephalic, without obvious abnormality, atraumatic Neck: no adenopathy, no JVD, supple, symmetrical, trachea midline and thyroid not enlarged, symmetric, no tenderness/mass/nodules Lymph nodes: Cervical, supraclavicular, and axillary nodes normal. Resp: rhonchi generalized Back: symmetric, no curvature. ROM normal. No CVA tenderness. Cardio: regular rate and rhythm, S1, S2 normal, no murmur, click, rub or gallop GI: soft, non-tender; bowel sounds normal; no masses,  no organomegaly Extremities: extremities normal, atraumatic, no cyanosis or edema  ECOG PERFORMANCE STATUS: 1 - Symptomatic but completely ambulatory  Blood pressure 134/76, pulse 75, temperature 99.5 F (37.5 C), temperature source Oral, height 5'  6" (1.676 m), weight 179 lb 14.4 oz (81.602 kg).  LABORATORY DATA: Lab Results  Component Value Date   WBC 3.1* 11/29/2013   HGB 10.5* 11/29/2013   HCT 32.3* 11/29/2013   MCV 92.8 11/29/2013   PLT 148 11/29/2013      Chemistry      Component Value Date/Time   NA 140 11/29/2013 1228   NA 138 11/24/2013 0504   K 4.0 11/29/2013 1228   K 3.6 11/24/2013 0504   CL 103 11/24/2013 0504   CO2 28 11/29/2013 1228   CO2 26 11/24/2013 0504   BUN 8.1 11/29/2013 1228   BUN 13 11/24/2013 0504   CREATININE 0.7 11/29/2013 1228   CREATININE 0.71 11/24/2013 0504      Component Value Date/Time   CALCIUM 8.9 11/29/2013 1228   CALCIUM 8.5 11/24/2013 0504   ALKPHOS 142 11/29/2013 1228   ALKPHOS 151* 11/24/2013 0504   AST 24 11/29/2013 1228   AST 19 11/24/2013 0504   ALT 40 11/29/2013 1228   ALT 56* 11/24/2013 0504   BILITOT 0.33 11/29/2013 1228   BILITOT 0.3 11/24/2013 0504       RADIOGRAPHIC STUDIES: Mr Laqueta Jean Wo Contrast  29-Oct-2013   CLINICAL DATA:  Right ear pain for 3 months. Lung cancer.  EXAM: MRI HEAD WITHOUT AND WITH CONTRAST  TECHNIQUE: Multiplanar, multiecho pulse sequences of the brain and surrounding structures were obtained according to standard protocol without and with intravenous contrast  CONTRAST:  16mL MULTIHANCE GADOBENATE DIMEGLUMINE 529 MG/ML IV SOLN  COMPARISON:  CT head 08/06/2011.  FINDINGS: The patient was unable to remain motionless for the exam. Small or subtle lesions could be overlooked.  No evidence for acute infarction, hemorrhage, mass lesion, hydrocephalus, or extra-axial fluid. Mild atrophy. Mild chronic microvascular ischemic change. No lacunar or large vessel infarct. Tiny focus of chronic hemorrhage right inferior temporal lobe.  There is slight heterogeneity of the calvarium, skull base, and upper cervical region. Osseous metastatic disease is not excluded. There is slight nonspecific flattening of the C4 vertebral body not present on previous study from 2005.  Normal  pituitary and cerebellar tonsils. Flow voids are maintained in the carotid, basilar, and vertebral arteries.  Post infusion, there is no abnormal enhancement of the brain or meninges.  Note intra-axial metastatic disease is observed. The scalp and extracranial soft tissues appear unremarkable. There is no sinus or mastoid disease. Negative orbits.  Compared with prior CT, the atrophy may have mildly progressed.  IMPRESSION: No intracranial metastatic disease is observed.  Mild atrophy and mild chronic microvascular ischemic change.  Heterogeneity of the calvarium, skull base, and upper cervical region could represent early osseous metastatic disease.   Electronically Signed   By: Davonna Belling M.D.   On: 10/07/2013 13:42   Nm Pet Image Initial (pi) Skull Base To Thigh  10/07/2013   CLINICAL DATA:  Initial treatment strategy for lung cancer.  EXAM: NUCLEAR MEDICINE PET SKULL BASE TO THIGH  FASTING BLOOD GLUCOSE:  Value:  104 mg/dl  TECHNIQUE: 52.8 mCi U-13 FDG was injected intravenously. CT data was obtained and used for attenuation correction and anatomic localization only. (This was not acquired as a diagnostic CT examination.) Additional exam technical data entered on technologist worksheet.  COMPARISON:  09/04/2013 ; 11/20/2012  FINDINGS: NECK  Negative.  CHEST  4.6 x 4.1 cm right lower lobe mass extends to the my in a minor fissure/ hilar region and has a maximum standard uptake value the of 18.1. There is associated postobstructive pneumonitis and occlusion of the right lower lobe bronchus. Trace right pleural effusion.  Coronary atherosclerosis noted. 1 cm right lower paratracheal lymph node does not have activity higher than background mediastinal tissues ; similar AP window and small anterior mediastinal lymph nodes do not appear hypermetabolic.  Trace pericardial effusion.  ABDOMEN/PELVIS  Aortoiliac atherosclerotic vascular disease. No findings of metastatic disease to the abdomen/ pelvis.  SKELETON  No  focal hypermetabolic activity to suggest skeletal metastasis.  IMPRESSION: 1. Highly hypermetabolic right lower lobe mass compatible with malignancy. Borderline enlarged mediastinal lymph nodes are not hypermetabolic. 2. Atherosclerosis. 3. Trace pericardial effusion and trace right pleural effusion.   Electronically Signed   By: Herbie Baltimore M.D.   On: 10/07/2013 14:41    ASSESSMENT AND PLAN: this is a very pleasant 67 years old white female with stage IIIa non-small cell lung cancer currently undergoing concurrent chemoradiation with weekly carboplatin and paclitaxel and tolerating her treatment fairly well. Patient was discussed with also seen by Dr. Arbutus Ped. She will complete with her course of concurrent chemoradiation as scheduled. She'll be given one Percocet tablet 5/325 mg for pain management while she is receiving her chemotherapy. She will followup with Dr. Arbutus Ped approximately 01/05/2014 with a restaging CT scan of her chest with contrast to reevaluate her disease. Patient is again encouraged to discontinue smoking. She'll return in 3 weeks for another symptom management visit with a repeat CBC differential and C. met.  Laural Benes, Vicky Mccanless E, PA-C   She was advised to call immediately if she has any concerning symptoms in the interval.  The patient voices understanding of current disease status and treatment options and is in agreement with the current care plan.  All questions were answered. The patient knows to call the clinic with any problems, questions or concerns. We can certainly see the patient much sooner if necessary.  ADDENDUM: Hematology/Oncology Attending: I had a face to face encounter with the patient. I recommended for her care plan. This is a 66 years old white female with stage IIIA non-small cell lung cancer currently undergoing concurrent chemoradiation with weekly carboplatin and paclitaxel and today is the last cycle of her treatment. She is rating her treatment  fairly well but recently was admitted to [facility] with  confusion after she took extra pain medication as well as high dose of Xanax. She is feeling much better today. We will proceed with the last cycle of her treatment as scheduled. She would come back for follow up visit in 5 weeks with repeat CT scan of the chest for restaging of her disease. She was advised to call immediately if she has any concerning symptoms in the interval.  Lajuana Matte., MD 12/01/2013

## 2013-11-30 ENCOUNTER — Ambulatory Visit: Payer: Medicare Other

## 2013-11-30 ENCOUNTER — Telehealth: Payer: Self-pay | Admitting: *Deleted

## 2013-11-30 NOTE — Telephone Encounter (Signed)
Called son after not able to reach ms, Yetter, her treatment is cancelled today, the machine is down for the day, return tomorrow regular scheduled time, well wishes his wife's surgery today goes well. Son thanked me for the call 10:33 AM

## 2013-12-01 ENCOUNTER — Ambulatory Visit: Payer: Medicare Other

## 2013-12-01 ENCOUNTER — Ambulatory Visit
Admission: RE | Admit: 2013-12-01 | Discharge: 2013-12-01 | Disposition: A | Discharge status: Home / Self Care | Payer: Medicare Other | Source: Ambulatory Visit | Attending: Radiation Oncology | Admitting: Radiation Oncology

## 2013-12-01 NOTE — Patient Instructions (Signed)
Complete her course of concurrent chemoradiation as scheduled Followup with Dr. Arbutus Ped in approximately 5 weeks with restaging CT scan of the chest to reevaluate your disease

## 2013-12-02 ENCOUNTER — Ambulatory Visit: Payer: Medicare Other

## 2013-12-02 ENCOUNTER — Ambulatory Visit
Admission: RE | Admit: 2013-12-02 | Discharge: 2013-12-02 | Disposition: A | Discharge status: Home / Self Care | Payer: Medicare Other | Source: Ambulatory Visit | Attending: Radiation Oncology | Admitting: Radiation Oncology

## 2013-12-02 NOTE — Progress Notes (Signed)
Department of Radiation Oncology  Phone:  [phone] Fax:        [phone]  Weekly Treatment Note    Name: [name] Date: 12/02/2013 MRN: [id number] DOB: 25-Jun-1946   Current dose: 62 Gy  Current fraction: 31   MEDICATIONS: Current Outpatient Prescriptions  Medication Sig Dispense Refill  . albuterol (PROVENTIL) (2.5 MG/3ML) 0.083% nebulizer solution Take 2.5 mg by nebulization as needed for wheezing.      [name] albuterol (VENTOLIN HFA) 108 (90 BASE) MCG/ACT inhaler Inhale 2 puffs into the lungs every 6 (six) hours as needed for wheezing or shortness of breath.  1 Inhaler  11  . ALPRAZolam (XANAX) 1 MG tablet Take 1 tablet (1 mg total) by mouth every 6 (six) hours as needed for anxiety.  30 tablet  0  . amLODipine (NORVASC) 5 MG tablet Take 1 tablet (5 mg total) by mouth daily.  30 tablet  0  . atenolol (TENORMIN) 50 MG tablet Take 1 tablet (50 mg total) by mouth daily.  30 tablet  0  . benzonatate (TESSALON) 200 MG capsule Take 200 mg by mouth 2 (two) times daily as needed for cough.      . chlorzoxazone (PARAFON) 500 MG tablet Take 500 mg by mouth 4 (four) times daily as needed for muscle spasms.      [name] dextromethorphan-guaiFENesin (MUCINEX DM) 30-600 MG per 12 hr tablet Take 1 tablet by mouth every 12 (twelve) hours.      . diphenhydrAMINE (BENADRYL) 25 mg capsule Take 25 mg by mouth 2 (two) times daily.       [name] esomeprazole (NEXIUM) 40 MG capsule Take 40 mg by mouth daily before breakfast.      . levofloxacin (LEVAQUIN) 750 MG tablet Take 1 tablet (750 mg total) by mouth daily.  3 tablet  0  . nicotine (NICODERM CQ - DOSED IN MG/24 HOURS) 14 mg/24hr patch Place 1 patch onto the skin daily.  28 patch  0  . oxyCODONE (OXY IR/ROXICODONE) 5 MG immediate release tablet Take 1 tablet (5 mg total) by mouth every 6 (six) hours as needed for severe pain.  30 tablet  0  . oxyCODONE-acetaminophen (PERCOCET) 10-325 MG per tablet Take 1 tablet by mouth every 6 (six) hours as  needed for pain.  60 tablet  0  . prochlorperazine (COMPAZINE) 10 MG tablet Take 10 mg by mouth every 6 (six) hours as needed for nausea.      . sertraline (ZOLOFT) 100 MG tablet Take 200 mg by mouth daily.       . sucralfate (CARAFATE) 1 G tablet Take 1 tablet (1 g total) by mouth 4 (four) times daily.  120 tablet  2  . trazodone (DESYREL) 300 MG tablet Take 300 mg by mouth at bedtime.       No current facility-administered medications for this encounter.     ALLERGIES: Prednisone; Nsaids; Aspirin; and Dulera   LABORATORY DATA:  Lab Results  Component Value Date   WBC 3.1* 11/29/2013   HGB 10.5* 11/29/2013   HCT 32.3* 11/29/2013   MCV 92.8 11/29/2013   PLT 148 11/29/2013   Lab Results  Component Value Date   NA 140 11/29/2013   K 4.0 11/29/2013   CL 103 11/24/2013   CO2 28 11/29/2013   Lab Results  Component Value Date   ALT 40 11/29/2013   AST 24 11/29/2013   ALKPHOS 142 11/29/2013   BILITOT 0.33 11/29/2013     NARRATIVE: Gabriela Pikes  D [name] was seen today for weekly treatment management. The chart was checked and the patient's films were reviewed. The patient states that she is doing relatively well at the end of treatment. No new complaints. Her main complaint today is fatigue.  PHYSICAL EXAMINATION:  Sitting in a wheelchair, in no acute distress, alert and oriented      ASSESSMENT: The patient is doing satisfactorily with treatment.  PLAN: We will continue with the patient's radiation treatment as planned. The patient will followup in our clinic in one month.

## 2013-12-03 ENCOUNTER — Ambulatory Visit
Admission: RE | Admit: 2013-12-03 | Discharge: 2013-12-03 | Disposition: A | Discharge status: Home / Self Care | Payer: Medicare Other | Source: Ambulatory Visit | Attending: Radiation Oncology | Admitting: Radiation Oncology

## 2013-12-03 ENCOUNTER — Ambulatory Visit: Payer: Medicare Other

## 2013-12-04 ENCOUNTER — Ambulatory Visit: Payer: Medicare Other

## 2013-12-06 ENCOUNTER — Encounter: Payer: Self-pay | Admitting: Radiation Oncology

## 2013-12-06 ENCOUNTER — Ambulatory Visit: Payer: Medicare Other

## 2013-12-06 ENCOUNTER — Other Ambulatory Visit (HOSPITAL_BASED_OUTPATIENT_CLINIC_OR_DEPARTMENT_OTHER): Payer: Medicare Other | Admitting: Lab

## 2013-12-06 ENCOUNTER — Ambulatory Visit
Admission: RE | Admit: 2013-12-06 | Discharge: 2013-12-06 | Disposition: A | Discharge status: Home / Self Care | Payer: Medicare Other | Source: Ambulatory Visit | Attending: Radiation Oncology | Admitting: Radiation Oncology

## 2013-12-06 DIAGNOSIS — C3430 Malignant neoplasm of lower lobe, unspecified bronchus or lung: Secondary | ICD-10-CM

## 2013-12-06 DIAGNOSIS — C3491 Malignant neoplasm of unspecified part of right bronchus or lung: Secondary | ICD-10-CM

## 2013-12-06 LAB — CBC WITH DIFFERENTIAL/PLATELET
BASO%: 1.5 % (ref 0.0–2.0)
EOS%: 1.8 % (ref 0.0–7.0)
MCH: 31.9 pg (ref 25.1–34.0)
MCHC: 33.5 g/dL (ref 31.5–36.0)
MCV: 95.3 fL (ref 79.5–101.0)
MONO%: 9.4 % (ref 0.0–14.0)
RBC: 3.27 10*6/uL — ABNORMAL LOW (ref 3.70–5.45)
RDW: 18.8 % — ABNORMAL HIGH (ref 11.2–14.5)
lymph#: 0.4 10*3/uL — ABNORMAL LOW (ref 0.9–3.3)

## 2013-12-06 LAB — COMPREHENSIVE METABOLIC PANEL (CC13)
ALT: 25 U/L (ref 0–55)
AST: 21 U/L (ref 5–34)
Albumin: 3.1 g/dL — ABNORMAL LOW (ref 3.5–5.0)
Alkaline Phosphatase: 126 U/L (ref 40–150)
Calcium: 8.6 mg/dL (ref 8.4–10.4)
Chloride: 103 mEq/L (ref 98–109)
Creatinine: 0.7 mg/dL (ref 0.6–1.1)
Potassium: 4.5 mEq/L (ref 3.5–5.1)
Sodium: 138 mEq/L (ref 136–145)
Total Protein: 6.1 g/dL — ABNORMAL LOW (ref 6.4–8.3)

## 2013-12-07 ENCOUNTER — Ambulatory Visit: Payer: Medicare Other

## 2013-12-08 NOTE — Progress Notes (Signed)
  Radiation Oncology         [phone] ________________________________  Name: [name] MRN: [id number]  Date: 12/06/2013  DOB: 08-02-1946  End of Treatment Note  Diagnosis:   Non-small cell lung cancer     Indication for treatment:  Curative       Radiation treatment dates:   10/18/2013 through 12/06/2013  Site/dose:   The patient was treated to the gross disease and high risk region within the chest initially to a dose of 60 gray. This consisted of a 4 field 3-D conformal technique. Daily image guidance was utilized to ensure accurate localization. The patient then received a 6 gray boost using a cone down 4 field technique. The patient's final dose was 66 gray.  Narrative: The patient tolerated radiation treatment relatively well.   Overall the patient did reasonably well during treatment. The patient experienced some esophagitis during treatment.  Plan: The patient has completed radiation treatment. The patient will return to radiation oncology clinic for routine followup in one month. I advised the patient to call or return sooner if they have any questions or concerns related to their recovery or treatment. ________________________________  Radene Gunning, M.D., Ph.D.

## 2013-12-13 ENCOUNTER — Other Ambulatory Visit: Payer: Medicare Other

## 2013-12-15 ENCOUNTER — Telehealth: Payer: Self-pay | Admitting: *Deleted

## 2013-12-15 NOTE — Telephone Encounter (Signed)
Unable to reach Ms. D[place]andro, A call was placed to her son Tammy Sours stating that Ms. Janczak appt with Dr. Nelly Rout would need to be rescheduled, due to a change in Dr. Forrestine Him schedule. Tammy Sours was asked to notify Ms. Currington of this change and also ask her to call the office at [phone] so we may reschedule her appt.

## 2013-12-16 ENCOUNTER — Telehealth: Payer: Self-pay | Admitting: Internal Medicine

## 2013-12-16 ENCOUNTER — Ambulatory Visit: Payer: Medicare Other | Admitting: Gynecologic Oncology

## 2013-12-16 MED ORDER — AMOXICILLIN-POT CLAVULANATE 875-125 MG PO TABS: 1.0000 | ORAL_TABLET | Freq: Two times a day (BID) | ORAL | Status: DC

## 2013-12-16 NOTE — Telephone Encounter (Signed)
Pt states that she is needing refill on augmentin Has a prod cough with green sputum x 2 days  Per MW- okay to send # 20 bid, but after this will need ov if needs anything further Pt aware  Rx was sent to pharm

## 2013-12-20 ENCOUNTER — Other Ambulatory Visit (HOSPITAL_BASED_OUTPATIENT_CLINIC_OR_DEPARTMENT_OTHER): Payer: Medicare Other

## 2013-12-20 DIAGNOSIS — C3430 Malignant neoplasm of lower lobe, unspecified bronchus or lung: Secondary | ICD-10-CM

## 2013-12-20 DIAGNOSIS — C3491 Malignant neoplasm of unspecified part of right bronchus or lung: Secondary | ICD-10-CM

## 2013-12-20 LAB — CBC WITH DIFFERENTIAL/PLATELET
BASO%: 0.6 % (ref 0.0–2.0)
Basophils Absolute: 0 10*3/uL (ref 0.0–0.1)
EOS%: 1.3 % (ref 0.0–7.0)
Eosinophils Absolute: 0 10*3/uL (ref 0.0–0.5)
HGB: 12.1 g/dL (ref 11.6–15.9)
LYMPH%: 16.6 % (ref 14.0–49.7)
MCH: 31.6 pg (ref 25.1–34.0)
MCHC: 32.7 g/dL (ref 31.5–36.0)
MCV: 96.6 fL (ref 79.5–101.0)
MONO%: 8.6 % (ref 0.0–14.0)
NEUT#: 2.3 10*3/uL (ref 1.5–6.5)
NEUT%: 72.9 % (ref 38.4–76.8)
Platelets: 118 10*3/uL — ABNORMAL LOW (ref 145–400)
RDW: 17.9 % — ABNORMAL HIGH (ref 11.2–14.5)

## 2013-12-21 ENCOUNTER — Other Ambulatory Visit: Payer: Self-pay | Admitting: *Deleted

## 2013-12-21 ENCOUNTER — Telehealth: Payer: Self-pay | Admitting: Medical Oncology

## 2013-12-21 MED ORDER — PROCHLORPERAZINE MALEATE 10 MG PO TABS: 10.0000 mg | ORAL_TABLET | Freq: Four times a day (QID) | ORAL | Status: DC | PRN

## 2013-12-21 NOTE — Telephone Encounter (Signed)
I called [employer] and requested delivery of compazine.

## 2013-12-28 ENCOUNTER — Other Ambulatory Visit: Payer: Self-pay | Admitting: *Deleted

## 2013-12-28 MED ORDER — ONDANSETRON 4 MG PO TBDP: 4.0000 mg | ORAL_TABLET | Freq: Three times a day (TID) | ORAL | Status: DC | PRN

## 2013-12-28 NOTE — Telephone Encounter (Signed)
Pt called stating she has been constantly nauseated.  Her PCP called in phenergan yesterday and it did not work.  She also has compazine and she states this did not work either.  Pt is requesting something to be given under the tongue.  Per dr Donnald Garre, pt may have 4mg  zofran odt.  Pt also instructed that she can take compazine along with zofran if needed.  Pt verbalized understanding.  SLJ

## 2013-12-29 ENCOUNTER — Other Ambulatory Visit: Payer: Self-pay | Admitting: *Deleted

## 2013-12-29 ENCOUNTER — Encounter: Payer: Self-pay | Admitting: Radiation Oncology

## 2013-12-31 ENCOUNTER — Emergency Department (HOSPITAL_COMMUNITY): Payer: Medicare Other

## 2013-12-31 ENCOUNTER — Telehealth: Payer: Self-pay | Admitting: Medical Oncology

## 2013-12-31 ENCOUNTER — Observation Stay (HOSPITAL_COMMUNITY): Payer: Medicare Other

## 2013-12-31 ENCOUNTER — Observation Stay (HOSPITAL_COMMUNITY)
Admission: EM | Admit: 2013-12-31 | Discharge: 2014-01-01 | Disposition: A | Discharge status: Home / Self Care | Payer: Medicare Other | Attending: Internal Medicine | Admitting: Internal Medicine

## 2013-12-31 ENCOUNTER — Encounter (HOSPITAL_COMMUNITY): Payer: Self-pay | Admitting: Emergency Medicine

## 2013-12-31 DIAGNOSIS — F17200 Nicotine dependence, unspecified, uncomplicated: Secondary | ICD-10-CM

## 2013-12-31 DIAGNOSIS — R222 Localized swelling, mass and lump, trunk: Secondary | ICD-10-CM | POA: Insufficient documentation

## 2013-12-31 DIAGNOSIS — N390 Urinary tract infection, site not specified: Secondary | ICD-10-CM | POA: Diagnosis present

## 2013-12-31 DIAGNOSIS — R918 Other nonspecific abnormal finding of lung field: Secondary | ICD-10-CM | POA: Diagnosis present

## 2013-12-31 DIAGNOSIS — F329 Major depressive disorder, single episode, unspecified: Secondary | ICD-10-CM

## 2013-12-31 DIAGNOSIS — J984 Other disorders of lung: Secondary | ICD-10-CM

## 2013-12-31 DIAGNOSIS — G8929 Other chronic pain: Secondary | ICD-10-CM

## 2013-12-31 DIAGNOSIS — D071 Carcinoma in situ of vulva: Secondary | ICD-10-CM | POA: Diagnosis present

## 2013-12-31 DIAGNOSIS — I10 Essential (primary) hypertension: Secondary | ICD-10-CM

## 2013-12-31 DIAGNOSIS — J449 Chronic obstructive pulmonary disease, unspecified: Secondary | ICD-10-CM | POA: Diagnosis present

## 2013-12-31 DIAGNOSIS — R109 Unspecified abdominal pain: Secondary | ICD-10-CM | POA: Diagnosis present

## 2013-12-31 DIAGNOSIS — F419 Anxiety disorder, unspecified: Secondary | ICD-10-CM

## 2013-12-31 DIAGNOSIS — J189 Pneumonia, unspecified organism: Secondary | ICD-10-CM

## 2013-12-31 DIAGNOSIS — G9340 Encephalopathy, unspecified: Secondary | ICD-10-CM

## 2013-12-31 DIAGNOSIS — N950 Postmenopausal bleeding: Secondary | ICD-10-CM | POA: Diagnosis present

## 2013-12-31 DIAGNOSIS — Z8673 Personal history of transient ischemic attack (TIA), and cerebral infarction without residual deficits: Secondary | ICD-10-CM

## 2013-12-31 DIAGNOSIS — S92209A Fracture of unspecified tarsal bone(s) of unspecified foot, initial encounter for closed fracture: Secondary | ICD-10-CM

## 2013-12-31 DIAGNOSIS — R042 Hemoptysis: Secondary | ICD-10-CM

## 2013-12-31 DIAGNOSIS — R112 Nausea with vomiting, unspecified: Principal | ICD-10-CM | POA: Diagnosis present

## 2013-12-31 DIAGNOSIS — F32A Depression, unspecified: Secondary | ICD-10-CM

## 2013-12-31 DIAGNOSIS — Y95 Nosocomial condition: Secondary | ICD-10-CM

## 2013-12-31 DIAGNOSIS — S92309A Fracture of unspecified metatarsal bone(s), unspecified foot, initial encounter for closed fracture: Secondary | ICD-10-CM

## 2013-12-31 DIAGNOSIS — J4489 Other specified chronic obstructive pulmonary disease: Secondary | ICD-10-CM | POA: Insufficient documentation

## 2013-12-31 LAB — URINE MICROSCOPIC-ADD ON

## 2013-12-31 LAB — COMPREHENSIVE METABOLIC PANEL
ALT: 7 U/L (ref 0–35)
AST: 15 U/L (ref 0–37)
Albumin: 3.9 g/dL (ref 3.5–5.2)
Alkaline Phosphatase: 124 U/L — ABNORMAL HIGH (ref 39–117)
BILIRUBIN TOTAL: 0.3 mg/dL (ref 0.3–1.2)
BUN: 8 mg/dL (ref 6–23)
CHLORIDE: 104 meq/L (ref 96–112)
CO2: 27 meq/L (ref 19–32)
CREATININE: 0.72 mg/dL (ref 0.50–1.10)
Calcium: 9 mg/dL (ref 8.4–10.5)
GFR calc Af Amer: >90 mL/min (ref >90)
GFR, EST NON AFRICAN AMERICAN: 88 mL/min — AB (ref >90)
Glucose, Bld: 101 mg/dL — ABNORMAL HIGH (ref 70–99)
Potassium: 3.5 mEq/L — ABNORMAL LOW (ref 3.7–5.3)
Sodium: 141 mEq/L (ref 137–147)
Total Protein: 7.4 g/dL (ref 6.0–8.3)

## 2013-12-31 LAB — CBC WITH DIFFERENTIAL/PLATELET
Basophils Absolute: 0 10*3/uL (ref 0.0–0.1)
Basophils Relative: 1 % (ref 0–1)
Eosinophils Absolute: 0.1 10*3/uL (ref 0.0–0.7)
Eosinophils Relative: 2 % (ref 0–5)
HEMATOCRIT: 39.7 % (ref 36.0–46.0)
HEMOGLOBIN: 13.3 g/dL (ref 12.0–15.0)
LYMPHS PCT: 15 % (ref 12–46)
Lymphs Abs: 0.6 10*3/uL — ABNORMAL LOW (ref 0.7–4.0)
MCH: 32.6 pg (ref 26.0–34.0)
MCHC: 33.5 g/dL (ref 30.0–36.0)
MCV: 97.3 fL (ref 78.0–100.0)
MONO ABS: 0.4 10*3/uL (ref 0.1–1.0)
Monocytes Relative: 9 % (ref 3–12)
Neutro Abs: 3.2 10*3/uL (ref 1.7–7.7)
Neutrophils Relative %: 74 % (ref 43–77)
Platelets: 208 10*3/uL (ref 150–400)
RBC: 4.08 MIL/uL (ref 3.87–5.11)
RDW: 16.3 % — ABNORMAL HIGH (ref 11.5–15.5)
WBC: 4.4 10*3/uL (ref 4.0–10.5)

## 2013-12-31 LAB — URINALYSIS, ROUTINE W REFLEX MICROSCOPIC
Bilirubin Urine: NEGATIVE
GLUCOSE, UA: NEGATIVE mg/dL
Hgb urine dipstick: NEGATIVE
KETONES UR: NEGATIVE mg/dL
Nitrite: NEGATIVE
Specific Gravity, Urine: >1.03 — ABNORMAL HIGH (ref 1.005–1.030)
Urobilinogen, UA: 0.2 mg/dL (ref 0.0–1.0)
pH: 6 (ref 5.0–8.0)

## 2013-12-31 LAB — LIPASE, BLOOD: LIPASE: 25 U/L (ref 11–59)

## 2013-12-31 MED ORDER — ONDANSETRON HCL 4 MG/2ML IJ SOLN
4.0000 mg | Freq: Once | INTRAMUSCULAR | Status: AC
  Administered 2013-12-31: 4 mg via INTRAVENOUS

## 2013-12-31 MED ORDER — TIOTROPIUM BROMIDE MONOHYDRATE 18 MCG IN CAPS
18.0000 ug | ORAL_CAPSULE | Freq: Every day | RESPIRATORY_TRACT | Status: DC
  Filled 2013-12-31: qty 5

## 2013-12-31 MED ORDER — ONDANSETRON HCL 4 MG/2ML IJ SOLN: 4.0000 mg | Freq: Four times a day (QID) | INTRAMUSCULAR | Status: DC | PRN

## 2013-12-31 MED ORDER — ATENOLOL 25 MG PO TABS
50.0000 mg | ORAL_TABLET | Freq: Every day | ORAL | Status: DC
  Administered 2014-01-01: 50 mg via ORAL
  Filled 2013-12-31: qty 2

## 2013-12-31 MED ORDER — SUCRALFATE 1 G PO TABS
1.0000 g | ORAL_TABLET | Freq: Four times a day (QID) | ORAL | Status: DC
  Administered 2013-12-31 – 2014-01-01 (×2): 1 g via ORAL
  Filled 2013-12-31 (×2): qty 1

## 2013-12-31 MED ORDER — OXYCODONE HCL 5 MG PO TABS
5.0000 mg | ORAL_TABLET | Freq: Four times a day (QID) | ORAL | Status: DC | PRN
  Administered 2013-12-31 – 2014-01-01 (×2): 5 mg via ORAL
  Filled 2013-12-31 (×2): qty 1

## 2013-12-31 MED ORDER — ALBUTEROL SULFATE (2.5 MG/3ML) 0.083% IN NEBU: 2.5000 mg | INHALATION_SOLUTION | RESPIRATORY_TRACT | Status: DC | PRN

## 2013-12-31 MED ORDER — DM-GUAIFENESIN ER 30-600 MG PO TB12
1.0000 | ORAL_TABLET | Freq: Two times a day (BID) | ORAL | Status: DC
  Administered 2013-12-31 – 2014-01-01 (×2): 1 via ORAL
  Filled 2013-12-31 (×2): qty 1

## 2013-12-31 MED ORDER — IOHEXOL 300 MG/ML  SOLN
100.0000 mL | Freq: Once | INTRAMUSCULAR | Status: AC | PRN
  Administered 2013-12-31: 100 mL via INTRAVENOUS

## 2013-12-31 MED ORDER — PANTOPRAZOLE SODIUM 40 MG IV SOLR
40.0000 mg | INTRAVENOUS | Status: DC
  Administered 2013-12-31: 40 mg via INTRAVENOUS
  Filled 2013-12-31: qty 40

## 2013-12-31 MED ORDER — DIPHENHYDRAMINE HCL 25 MG PO CAPS: 25.0000 mg | ORAL_CAPSULE | Freq: Two times a day (BID) | ORAL | Status: DC | PRN

## 2013-12-31 MED ORDER — ALPRAZOLAM 1 MG PO TABS
1.0000 mg | ORAL_TABLET | Freq: Four times a day (QID) | ORAL | Status: DC | PRN
  Administered 2013-12-31 – 2014-01-01 (×2): 1 mg via ORAL
  Filled 2013-12-31 (×2): qty 1

## 2013-12-31 MED ORDER — POTASSIUM CHLORIDE CRYS ER 20 MEQ PO TBCR
40.0000 meq | EXTENDED_RELEASE_TABLET | Freq: Once | ORAL | Status: AC
  Administered 2013-12-31: 40 meq via ORAL
  Filled 2013-12-31: qty 2

## 2013-12-31 MED ORDER — DEXTROSE 5 % IV SOLN
1.0000 g | INTRAVENOUS | Status: DC
  Administered 2013-12-31: 1 g via INTRAVENOUS
  Filled 2013-12-31 (×3): qty 10

## 2013-12-31 MED ORDER — SODIUM CHLORIDE 0.9 % IV BOLUS (SEPSIS)
500.0000 mL | Freq: Once | INTRAVENOUS | Status: AC
  Administered 2013-12-31: 500 mL via INTRAVENOUS

## 2013-12-31 MED ORDER — OXYCODONE-ACETAMINOPHEN 10-325 MG PO TABS: 1.0000 | ORAL_TABLET | Freq: Four times a day (QID) | ORAL | Status: DC | PRN

## 2013-12-31 MED ORDER — DEXTROSE 5 % IV SOLN
INTRAVENOUS | Status: AC
  Filled 2013-12-31: qty 10

## 2013-12-31 MED ORDER — TRAZODONE HCL 50 MG PO TABS
100.0000 mg | ORAL_TABLET | Freq: Every evening | ORAL | Status: DC | PRN
  Administered 2013-12-31: 100 mg via ORAL
  Filled 2013-12-31: qty 2

## 2013-12-31 MED ORDER — ENOXAPARIN SODIUM 40 MG/0.4ML ~~LOC~~ SOLN
40.0000 mg | SUBCUTANEOUS | Status: DC
  Administered 2013-12-31: 40 mg via SUBCUTANEOUS
  Filled 2013-12-31: qty 0.4

## 2013-12-31 MED ORDER — ONDANSETRON HCL 4 MG/2ML IJ SOLN
4.0000 mg | Freq: Once | INTRAMUSCULAR | Status: DC
  Filled 2013-12-31: qty 2

## 2013-12-31 MED ORDER — ONDANSETRON HCL 4 MG PO TABS: 4.0000 mg | ORAL_TABLET | Freq: Four times a day (QID) | ORAL | Status: DC | PRN

## 2013-12-31 MED ORDER — NICOTINE 14 MG/24HR TD PT24: 14.0000 mg | MEDICATED_PATCH | Freq: Every day | TRANSDERMAL | Status: DC

## 2013-12-31 MED ORDER — LORAZEPAM 2 MG/ML IJ SOLN
1.0000 mg | Freq: Once | INTRAMUSCULAR | Status: AC
  Administered 2013-12-31: 1 mg via INTRAVENOUS
  Filled 2013-12-31: qty 1

## 2013-12-31 MED ORDER — MORPHINE SULFATE 2 MG/ML IJ SOLN: 1.0000 mg | INTRAMUSCULAR | Status: DC | PRN

## 2013-12-31 MED ORDER — SODIUM CHLORIDE 0.9 % IV SOLN
INTRAVENOUS | Status: AC
  Administered 2013-12-31: 22:00:00 via INTRAVENOUS

## 2013-12-31 MED ORDER — AMLODIPINE BESYLATE 5 MG PO TABS
5.0000 mg | ORAL_TABLET | Freq: Every day | ORAL | Status: DC
  Administered 2014-01-01: 5 mg via ORAL
  Filled 2013-12-31: qty 1

## 2013-12-31 MED ORDER — OXYCODONE-ACETAMINOPHEN 5-325 MG PO TABS
1.0000 | ORAL_TABLET | Freq: Four times a day (QID) | ORAL | Status: DC | PRN
  Administered 2013-12-31 – 2014-01-01 (×2): 1 via ORAL
  Filled 2013-12-31 (×2): qty 1

## 2013-12-31 MED ORDER — ONDANSETRON HCL 4 MG/2ML IJ SOLN
4.0000 mg | Freq: Once | INTRAMUSCULAR | Status: AC
  Administered 2013-12-31: 4 mg via INTRAVENOUS
  Filled 2013-12-31: qty 2

## 2013-12-31 MED ORDER — ACETAMINOPHEN 650 MG RE SUPP: 650.0000 mg | Freq: Four times a day (QID) | RECTAL | Status: DC | PRN

## 2013-12-31 MED ORDER — SERTRALINE HCL 50 MG PO TABS
200.0000 mg | ORAL_TABLET | Freq: Every day | ORAL | Status: DC
  Administered 2014-01-01: 200 mg via ORAL
  Filled 2013-12-31: qty 4

## 2013-12-31 MED ORDER — MORPHINE SULFATE 4 MG/ML IJ SOLN
4.0000 mg | Freq: Once | INTRAMUSCULAR | Status: AC
  Administered 2013-12-31: 4 mg via INTRAVENOUS
  Filled 2013-12-31: qty 1

## 2013-12-31 MED ORDER — ACETAMINOPHEN 325 MG PO TABS
650.0000 mg | ORAL_TABLET | Freq: Four times a day (QID) | ORAL | Status: DC | PRN
  Filled 2013-12-31: qty 2

## 2013-12-31 NOTE — Telephone Encounter (Signed)
Pt left message that she is very sick , No answer when I called pt back . I called  [name] and that is only number he has. When I could not get pt on phone I saw she was in ED at [facility]. [place] notified.

## 2013-12-31 NOTE — ED Provider Notes (Signed)
CSN: [id number]     Arrival date & time 12/31/13  1218 History  This chart was scribed for [employer]. [name], MD by [name], ED Scribe. This patient was seen in room APA04/APA04 and the patient's care was started at 1:10 PM.    Chief Complaint  Patient presents with  . Nausea  . Emesis    The history is provided by the patient. No language interpreter was used.    HPI Comments: [name] is a 68 y.o. 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 She reports a mild amount of diarrhea this morning but states that this is improving. She reports a decreased appetite secondary to the nausea stating that even food smells can worsen the nausea. Pt states that she feels like she has been sweating but she denies any known fevers. She states that she lives in a senior living apartment complex and has several sick contacts. She states that she was put on Reglan and phenergan with no improvement. She was given a Zofran prescription but has not gotten it filled. She states that she has f/u with her provider since the onset. She reports that she has had a vulvectomy but began vaginal bleeding yesterday. She denies any hematochezia or urinary symptoms.   Past Medical History  Diagnosis Date  . H/O: stroke 2012, 2000,     X3  . Hypertension   . Anxiety   . PTSD (post-traumatic stress disorder)   . Depression   . SVD (spontaneous vaginal delivery)     x 1  . Smoker   . Osteoarthritis     knees, hips hands,   . Shortness of breath   . Pain     SEVERE PAIN BACK, RIGHT HIP AND KNEES--HARRINGTON RODS AND CERVICAL PLATES--USES WALKER OR CANE WHEN AMBULATING - GOES TO A PAIN CLINIC-STATES SHE NEEDS RT HIP AND BOTH KNEES REPLACED. PT STATES SHE WAS TOLD THE CEMENT AROUND THE HARRINGTON  RODS IS CRACKED.  [name] Hot flashes, menopausal     SEVERE  . Family history of anesthesia complication     equipment failure caused problem  . Complication of anesthesia 03/02/13    severe headache,vertigo postop  . COPD (chronic obstructive pulmonary disease)     stable  . 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 Lung cancer   . Status post radiation therapy within four to twelve weeks 10/18/13-12/06/13    lung ca  66Gy   Past Surgical History  Procedure Laterality Date  . Carpal tunnel release      BILATERAL  . Nasal sinus surgery    . Lumbar fusion      L4-S1  . Cervical fusion    . Hernia repair    . Salpingoophorectomy Right   . Vaginal hysterectomy      TAH w/ ovary removal  . Transthoracic echocardiogram  12-03-2011    LVSF NORMAL/ EF 60-65%  . Vulvectomy N/A 03/02/2013    Procedure: WIDE LOCAL EXCISION VULVAR;  Surgeon: Imagene Gurney A. Alycia Rossetti, MD;  Location: [facility];  Service: Gynecology;  Laterality: N/A;  . Co2 laser application N/A [id number]/[id number]    Procedure: LASER APPLICATION OF THE VULVA;  Surgeon: Janie Morning, MD;  Location: [facility];  Service: Gynecology;  Laterality: N/A;  . Video bronchoscopy Bilateral 09/23/2013  Procedure: VIDEO BRONCHOSCOPY WITHOUT FLUORO;  Surgeon: Tanda Rockers, MD;  Location: [facility];  Service: Cardiopulmonary;  Laterality: Bilateral;   Family History  Problem Relation Age of Onset  . Cancer Mother     COLON  . Heart disease Father   . Diabetes Maternal Aunt   . Cancer Maternal Grandfather     KIDNEY   . Diabetes Maternal Grandfather   . Hypertension Maternal Grandfather   . Heart disease Maternal Grandfather    History  Substance Use Topics  . Smoking status: Current Some Day Smoker -- 0.50 packs/day for 35 years    Types: Cigarettes    Last Attempt to Quit: 09/13/2013  . Smokeless tobacco: Never Used     Comment: 4cigs per day 02/17/13  . Alcohol Use: No   OB History   Grav Para Term Preterm Abortions TAB SAB Ect  Mult Living   2 1   1     1      Review of Systems  Constitutional: Negative for fever.  Respiratory: Negative for shortness of breath.   Gastrointestinal: Positive for nausea, vomiting and diarrhea. Negative for blood in stool and anal bleeding.  Genitourinary: Positive for vaginal bleeding.  All other systems reviewed and are negative.    Allergies  Prednisone; Nsaids; Aspirin; and Dulera  Home Medications   Current Outpatient Rx  Name  Route  Sig  Dispense  Refill  . Aclidinium Bromide (TUDORZA PRESSAIR) 400 MCG/ACT AEPB   Inhalation   Inhale 1 puff into the lungs 2 (two) times daily.         [name] albuterol (PROVENTIL) (2.5 MG/3ML) 0.083% nebulizer solution   Nebulization   Take 2.5 mg by nebulization as needed for wheezing.         [name] albuterol (VENTOLIN HFA) 108 (90 BASE) MCG/ACT inhaler   Inhalation   Inhale 2 puffs into the lungs every 6 (six) hours as needed for wheezing or shortness of breath.   1 Inhaler   11   . amLODipine (NORVASC) 5 MG tablet   Oral   Take 1 tablet (5 mg total) by mouth daily.   30 tablet   0   . atenolol (TENORMIN) 50 MG tablet   Oral   Take 1 tablet (50 mg total) by mouth daily.   30 tablet   0   . chlorzoxazone (PARAFON) 500 MG tablet   Oral   Take 500 mg by mouth 4 (four) times daily as needed for muscle spasms.         [name] dextromethorphan-guaiFENesin (MUCINEX DM) 30-600 MG per 12 hr tablet   Oral   Take 1 tablet by mouth every 12 (twelve) hours.         . diphenhydrAMINE (BENADRYL) 25 mg capsule   Oral   Take 25 mg by mouth 2 (two) times daily as needed for itching.          . esomeprazole (NEXIUM) 40 MG capsule   Oral   Take 40 mg by mouth daily.          . nicotine (NICODERM CQ - DOSED IN MG/24 HOURS) 14 mg/24hr patch   Transdermal   Place 1 patch onto the skin daily.   28 patch   0   . prochlorperazine (COMPAZINE) 10 MG tablet   Oral   Take 1 tablet (10 mg total) by mouth every 6 (six) hours as needed  for nausea.   60 tablet   0   . sertraline (  ZOLOFT) 100 MG tablet   Oral   Take 200 mg by mouth daily.          . sucralfate (CARAFATE) 1 G tablet   Oral   Take 1 tablet (1 g total) by mouth 4 (four) times daily.   120 tablet   2   . trazodone (DESYREL) 300 MG tablet   Oral   Take 300 mg by mouth at bedtime.         . ALPRAZolam (XANAX) 1 MG tablet   Oral   Take 1 tablet (1 mg total) by mouth every 6 (six) hours as needed for anxiety.   30 tablet   0   . levofloxacin (LEVAQUIN) 500 MG tablet   Oral   Take 1 tablet (500 mg total) by mouth daily.   4 tablet   0   . ondansetron (ZOFRAN ODT) 4 MG disintegrating tablet   Oral   Take 1 tablet (4 mg total) by mouth every 8 (eight) hours as needed for nausea or vomiting.   20 tablet   0   . oxyCODONE-acetaminophen (PERCOCET) 10-325 MG per tablet   Oral   Take 1 tablet by mouth every 6 (six) hours as needed for pain.   60 tablet   0    Triage Vitals: BP 162/85  Pulse 73  Temp(Src) 98.3 F (36.8 C) (Oral)  Resp 12  Ht 5\' 6"  (1.676 m)  Wt 170 lb (77.111 kg)  BMI 27.45 kg/m2  SpO2 100%  Physical Exam  Nursing note and vitals reviewed. Constitutional: She is oriented to person, place, and time. She appears well-developed and well-nourished. No distress.  HENT:  Head: Normocephalic and atraumatic.  Mildly dry MM  Eyes: Conjunctivae and EOM are normal.  Neck: Normal range of motion. Neck supple. No tracheal deviation present.  Cardiovascular: Normal rate and regular rhythm.   Pulmonary/Chest: No respiratory distress. She has wheezes.  Diffuse harsh wheezes and prolonged expirations, 6 by 8 cm radiation burn to right posterior chest wall   Abdominal: Soft. There is tenderness. There is no rebound and no guarding.  upper abdominal tenderness   Musculoskeletal: Normal range of motion. She exhibits no edema (no peripheral edema ).  Neurological: She is alert and oriented to person, place, and time. No cranial  nerve deficit (no gross deficits).  Skin: Skin is warm and dry.  no bruising or ecchymosis  Psychiatric: She has a normal mood and affect. Her behavior is normal.    ED Course  Procedures (including critical care time)  Medications  0.9 %  sodium chloride infusion ( Intravenous New Bag/Given 12/31/13 2228)  sodium chloride 0.9 % bolus 500 mL (0 mLs Intravenous Stopped 12/31/13 1518)  ondansetron (ZOFRAN) injection 4 mg (4 mg Intravenous Given 12/31/13 1443)  LORazepam (ATIVAN) injection 1 mg (1 mg Intravenous Given 12/31/13 1443)  morphine 4 MG/ML injection 4 mg (4 mg Intravenous Given 12/31/13 1900)  ondansetron (ZOFRAN) injection 4 mg (4 mg Intravenous Given 12/31/13 1937)  iohexol (OMNIPAQUE) 300 MG/ML solution 100 mL (100 mLs Intravenous Contrast Given 12/31/13 2046)  potassium chloride SA (K-DUR,KLOR-CON) CR tablet 40 mEq (40 mEq Oral Given 12/31/13 2230)    DIAGNOSTIC STUDIES: Oxygen Saturation is 100% on RA, normal by my interpretation.    COORDINATION OF CARE: 1:18 PM-Discussed treatment plan which includes medications, CBC panel, CMP and UA with pt at bedside and pt agreed to plan.   Labs Review Labs Reviewed  CBC WITH DIFFERENTIAL - Abnormal; Notable for  the following:    RDW 16.3 (*)    Lymphs Abs 0.6 (*)    All other components within normal limits  COMPREHENSIVE METABOLIC PANEL - Abnormal; Notable for the following:    Potassium 3.5 (*)    Glucose, Bld 101 (*)    Alkaline Phosphatase 124 (*)    GFR calc non Af Amer 88 (*)    All other components within normal limits  URINALYSIS, ROUTINE W REFLEX MICROSCOPIC - Abnormal; Notable for the following:    Specific Gravity, Urine >1.030 (*)    Protein, ur TRACE (*)    Leukocytes, UA SMALL (*)    All other components within normal limits  URINE MICROSCOPIC-ADD ON - Abnormal; Notable for the following:    Squamous Epithelial / LPF MANY (*)    Bacteria, UA FEW (*)    All other components within normal limits  COMPREHENSIVE METABOLIC  PANEL - Abnormal; Notable for the following:    Calcium 8.0 (*)    Total Protein 5.9 (*)    Albumin 3.1 (*)    Total Bilirubin 0.2 (*)    GFR calc non Af Amer 89 (*)    All other components within normal limits  CBC - Abnormal; Notable for the following:    WBC 2.8 (*)    RBC 3.40 (*)    Hemoglobin 11.3 (*)    HCT 33.6 (*)    RDW 16.3 (*)    All other components within normal limits  URINE CULTURE  LIPASE, BLOOD   Imaging Review No results found.  EKG Interpretation    Date/Time:  Friday December 31 2013 14:11:05 EST Ventricular Rate:  72 PR Interval:  144 QRS Duration: 78 QT Interval:  436 QTC Calculation: 477 R Axis:   36 Text Interpretation:  Normal sinus rhythm Normal ECG When compared with ECG of 22-Nov-2013 16:30, PREVIOUS ECG IS PRESENT ED PHYSICIAN INTERPRETATION AVAILABLE IN [facility] Confirmed by TEST, RECORD ([id number]) on 01/02/2014 9:38:32 AM            MDM   1. Nausea & vomiting   2. Abdominal  pain, other specified site   3. UTI (lower urinary tract infection)   4. COPD mixed type   5. 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 COPD GOLD III   8. History of stroke   9. 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 OTHER CLOSED FRACTURE OF TARSAL&METATARSAL BONES   11. Smoking addiction   12. HTN (hypertension)   13. Depression   14. Anxiety   15. Essential hypertension, benign   16. Hemoptysis   17. Pneumonia   18. Chronic pain   19. Pneumonitis   20. Acute encephalopathy   21. Hospital acquired PNA    Patient with nausea vomiting diarrhea with abdominal pain. Urinalysis shows possible UTI. Will admit to internal medicine. I personally performed the services described in this documentation, which was scribed in my presence. The recorded information has been reviewed and is accurate.      Jasper Riling. [name], MD 01/07/14 1655

## 2013-12-31 NOTE — H&P (Signed)
[facility] History and Physical  [name] NWG:[id number] DOB: [birth year] DOA: 12/31/2013   PCP: [name], MD  Specialists: Dr. Julien Nordmann, is her oncologist. Dr. Lisbeth Renshaw is her radiation oncologist  Chief Complaint: Extreme nausea with vomiting, and upper abdominal pain for the last 2-3 weeks  HPI: [name] is a 68 y.o. 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 She was also on radiation treatments, and has completed that as well. She also has a history of vulvar cancer, for which she received surgery and laser treatment in 2014. She also has a history of hypertension, and arthritis. She was in her usual state of health about 2-3 weeks ago, when she started having nausea. It was mild at the beginning, but it has been getting progressively worse. She has had a few episodes of vomiting which has been not bloody. She has had poor appetite. She thinks she may have lost weight, but she's not sure. But she does admit to generalized weakness. She's had night sweats, but denies any fevers. No headaches. She also has abdominal pain in the center part of her abdomen, which is about 3-4/10 in intensity. No radiation of the pain. No precipitating, aggravating or relieving factors. She tells me that she feels very hungry, but is unable to eat because of nausea. She's had mild diarrhea on and off, but none today. She has chronic chest pain from her lung cancer and there has been no change with respect to that. She also mentioned that over the last few days she has once again noticed blood spots in her panties. She is concerned that she may be having recurrence of her vulvar cancer.  Home Medications: Prior to Admission medications   Medication Sig Start Date End Date Taking? Authorizing Provider  Aclidinium Bromide (TUDORZA PRESSAIR) 400 MCG/ACT AEPB Inhale 1 puff  into the lungs 2 (two) times daily.   Yes Historical Provider, MD  albuterol (PROVENTIL) (2.5 MG/3ML) 0.083% nebulizer solution Take 2.5 mg by nebulization as needed for wheezing.   Yes Historical Provider, MD  albuterol (VENTOLIN HFA) 108 (90 BASE) MCG/ACT inhaler Inhale 2 puffs into the lungs every 6 (six) hours as needed for wheezing or shortness of breath. 11/03/13  Yes Tanda Rockers, MD  ALPRAZolam Duanne Moron) 1 MG tablet Take 1 tablet (1 mg total) by mouth every 6 (six) hours as needed for anxiety. 11/25/13  Yes Velvet Bathe, MD  amLODipine (NORVASC) 5 MG tablet Take 1 tablet (5 mg total) by mouth daily. 11/25/13  Yes Velvet Bathe, MD  atenolol (TENORMIN) 50 MG tablet Take 1 tablet (50 mg total) by mouth daily. 11/25/13  Yes Velvet Bathe, MD  chlorzoxazone (PARAFON) 500 MG tablet Take 500 mg by mouth 4 (four) times daily as needed for muscle spasms.   Yes Historical Provider, MD  dextromethorphan-guaiFENesin (MUCINEX DM) 30-600 MG per 12 hr tablet Take 1 tablet by mouth every 12 (twelve) hours.   Yes Historical Provider, MD  diphenhydrAMINE (BENADRYL) 25 mg capsule Take 25 mg by mouth 2 (two) times daily as needed for itching.    Yes Historical Provider, MD  esomeprazole (NEXIUM) 40 MG capsule Take 40 mg by mouth daily.    Yes Historical Provider, MD  nicotine (NICODERM CQ - DOSED IN MG/24 HOURS) 14 mg/24hr patch Place 1 patch onto the skin daily. 09/06/13  Yes Lezlie Octave  Black, NP  oxyCODONE-acetaminophen (PERCOCET) 10-325 MG per tablet Take 1 tablet by mouth every 6 (six) hours as needed for pain. 11/29/13  Yes Marye Round, MD  prochlorperazine (COMPAZINE) 10 MG tablet Take 1 tablet (10 mg total) by mouth every 6 (six) hours as needed for nausea. 12/21/13  Yes Curt Bears, MD  sertraline (ZOLOFT) 100 MG tablet Take 200 mg by mouth daily.    Yes Historical Provider, MD  sucralfate (CARAFATE) 1 G tablet Take 1 tablet (1 g total) by mouth 4 (four) times daily. 11/12/13  Yes Marye Round, MD   trazodone (DESYREL) 300 MG tablet Take 300 mg by mouth at bedtime.   Yes Historical Provider, MD    Allergies:  Allergies  Allergen Reactions  . Prednisone     insomnia  . Nsaids     GI Upset  . Aspirin Other (See Comments)    Gi symptoms. Does NOT take ibuprofen or other NSAIDS  . Dulera [Mometasone Furo-Formoterol Fum] Palpitations    Panting " my body was twisted inside and out"    Past Medical History: Past Medical History  Diagnosis Date  . H/O: stroke 2012, 2000,     X3  . Hypertension   . Anxiety   . PTSD (post-traumatic stress disorder)   . Depression   . SVD (spontaneous vaginal delivery)     x 1  . Smoker   . Osteoarthritis     knees, hips hands,   . Shortness of breath   . Pain     SEVERE PAIN BACK, RIGHT HIP AND KNEES--HARRINGTON RODS AND CERVICAL PLATES--USES WALKER OR CANE WHEN AMBULATING - GOES TO A PAIN CLINIC-STATES SHE NEEDS RT HIP AND BOTH KNEES REPLACED. PT STATES SHE WAS TOLD THE CEMENT AROUND THE HARRINGTON RODS IS CRACKED.  [name] Hot flashes, menopausal     SEVERE  . Family history of anesthesia complication     equipment failure caused problem  . Complication of anesthesia 03/02/13    severe headache,vertigo postop  . COPD (chronic obstructive pulmonary disease)     stable  . 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 Lung cancer   . Status post radiation therapy within four to twelve weeks 10/18/13-12/06/13    lung ca  67Gy    Past Surgical History  Procedure Laterality Date  . Carpal tunnel release      BILATERAL  . Nasal sinus surgery    . Lumbar fusion      L4-S1  . Cervical fusion    . Hernia repair    . Salpingoophorectomy Right   . Vaginal hysterectomy      TAH w/ ovary removal  . Transthoracic echocardiogram  12-03-2011    LVSF NORMAL/ EF 60-65%  . Vulvectomy N/A 03/02/2013    Procedure: WIDE LOCAL EXCISION VULVAR;  Surgeon: Imagene Gurney A. Alycia Rossetti, MD;  Location: [facility];  Service: Gynecology;  Laterality: N/A;  . Co2 laser application N/A  0/38/8828    Procedure: LASER APPLICATION OF THE VULVA;  Surgeon: Janie Morning, MD;  Location: [facility];  Service: Gynecology;  Laterality: N/A;  . Video bronchoscopy Bilateral 09/23/2013    Procedure: VIDEO BRONCHOSCOPY WITHOUT FLUORO;  Surgeon: Tanda Rockers, MD;  Location: [facility];  Service: Cardiopulmonary;  Laterality: Bilateral;    Social History: She lives by herself. She smokes a cigarette once in a mile. She quit when she was diagnosed with lung cancer. Denies any alcohol use. No illicit drug use. She  uses a cane to ambulate due to her severe arthritis.  Family History:  Family History  Problem Relation Age of Onset  . Cancer Mother     COLON  . Heart disease Father   . Diabetes Maternal Aunt   . Cancer Maternal Grandfather     KIDNEY   . Diabetes Maternal Grandfather   . Hypertension Maternal Grandfather   . Heart disease Maternal Grandfather      Review of Systems - History obtained from the patient General ROS: positive for  - fatigue Psychological ROS: negative Ophthalmic ROS: negative ENT ROS: negative Allergy and Immunology ROS: negative Hematological and Lymphatic ROS: negative Endocrine ROS: negative Respiratory ROS: no cough, shortness of breath, or wheezing Cardiovascular ROS: as in hpi Gastrointestinal ROS: as in hpi Genito-Urinary ROS: as in hpi Musculoskeletal ROS: positive for - joint pain Neurological ROS: no TIA or stroke symptoms Dermatological ROS: negative  Physical Examination  Filed Vitals:   12/31/13 1238  BP: 162/85  Pulse: 73  Temp: 98.3 F (36.8 C)  TempSrc: Oral  Resp: 12  Height: _0  (1.676 m)  Weight: 77.111 kg (170 lb)  SpO2: 100%    General appearance: alert, cooperative, appears stated age and no distress Head: Normocephalic, without obvious abnormality, atraumatic Eyes: conjunctivae/corneas clear. PERRL, EOM's intact.  Throat: lips, mucosa, and tongue normal; teeth and gums normal Neck: no  adenopathy, no carotid bruit, no JVD, supple, symmetrical, trachea midline and thyroid not enlarged, symmetric, no tenderness/mass/nodules Resp: clear to auscultation bilaterally Cardio: regular rate and rhythm, S1, S2 normal, no murmur, click, rub or gallop GI: Abdomen is soft. There is fullness in the epigastric area. There is tenderness in that area as well without any rebound, rigidity, or guarding. There is also some tenderness diffusely. Bowel sounds are present. No specific masses or organomegaly appreciated. Extremities: extremities normal, atraumatic, no cyanosis or edema Pulses: 2+ and symmetric Skin: Skin color, texture, turgor normal. No rashes or lesions Neurologic: She is alert and oriented x3. No focal neurological deficits are present.  Laboratory Data: Results for orders placed during the hospital encounter of 12/31/13 (from the past 48 hour(s))  CBC WITH DIFFERENTIAL     Status: Abnormal   Collection Time    12/31/13  1:22 PM      Result Value Range   WBC 4.4  4.0 - 10.5 K/uL   RBC 4.08  3.87 - 5.11 MIL/uL   Hemoglobin 13.3  12.0 - 15.0 g/dL   HCT 39.7  36.0 - 46.0 %   MCV 97.3  78.0 - 100.0 fL   MCH 32.6  26.0 - 34.0 pg   MCHC 33.5  30.0 - 36.0 g/dL   RDW 16.3 (*) 11.5 - 15.5 %   Platelets 208  150 - 400 K/uL   Neutrophils Relative % 74  43 - 77 %   Neutro Abs 3.2  1.7 - 7.7 K/uL   Lymphocytes Relative 15  12 - 46 %   Lymphs Abs 0.6 (*) 0.7 - 4.0 K/uL   Monocytes Relative 9  3 - 12 %   Monocytes Absolute 0.4  0.1 - 1.0 K/uL   Eosinophils Relative 2  0 - 5 %   Eosinophils Absolute 0.1  0.0 - 0.7 K/uL   Basophils Relative 1  0 - 1 %   Basophils Absolute 0.0  0.0 - 0.1 K/uL  COMPREHENSIVE METABOLIC PANEL     Status: Abnormal   Collection Time    12/31/13  1:22 PM  Result Value Range   Sodium 141  137 - 147 mEq/L   Comment: Please note change in reference range.   Potassium 3.5 (*) 3.7 - 5.3 mEq/L   Comment: Please note change in reference range.    Chloride 104  96 - 112 mEq/L   CO2 27  19 - 32 mEq/L   Glucose, Bld 101 (*) 70 - 99 mg/dL   BUN 8  6 - 23 mg/dL   Creatinine, Ser 0.72  0.50 - 1.10 mg/dL   Calcium 9.0  8.4 - 10.5 mg/dL   Total Protein 7.4  6.0 - 8.3 g/dL   Albumin 3.9  3.5 - 5.2 g/dL   AST 15  0 - 37 U/L   ALT 7  0 - 35 U/L   Alkaline Phosphatase 124 (*) 39 - 117 U/L   Total Bilirubin 0.3  0.3 - 1.2 mg/dL   GFR calc non Af Amer 88 (*) >90 mL/min   GFR calc Af Amer >90  >90 mL/min   Comment: (NOTE)     The eGFR has been calculated using the CKD EPI equation.     This calculation has not been validated in all clinical situations.     eGFR's persistently <90 mL/min signify possible Chronic Kidney     Disease.  LIPASE, BLOOD     Status: None   Collection Time    12/31/13  1:22 PM      Result Value Range   Lipase 25  11 - 59 U/L  URINALYSIS, ROUTINE W REFLEX MICROSCOPIC     Status: Abnormal   Collection Time    12/31/13  2:00 PM      Result Value Range   Color, Urine YELLOW  YELLOW   APPearance CLEAR  CLEAR   Specific Gravity, Urine >1.030 (*) 1.005 - 1.030   pH 6.0  5.0 - 8.0   Glucose, UA NEGATIVE  NEGATIVE mg/dL   Hgb urine dipstick NEGATIVE  NEGATIVE   Bilirubin Urine NEGATIVE  NEGATIVE   Ketones, ur NEGATIVE  NEGATIVE mg/dL   Protein, ur TRACE (*) NEGATIVE mg/dL   Urobilinogen, UA 0.2  0.0 - 1.0 mg/dL   Nitrite NEGATIVE  NEGATIVE   Leukocytes, UA SMALL (*) NEGATIVE  URINE MICROSCOPIC-ADD ON     Status: Abnormal   Collection Time    12/31/13  2:00 PM      Result Value Range   Squamous Epithelial / LPF MANY (*) RARE   WBC, UA 7-10  <3 WBC/hpf   Bacteria, UA FEW (*) RARE    Radiology Reports: Dg Chest 2 View  12/31/2013   CLINICAL DATA:  Cough, nausea and vomiting, intermittent fever and weakness.  EXAM: CHEST  2 VIEW  COMPARISON:  Chest x-ray 11/22/2013.  FINDINGS: Linear opacities are noted in the region of the right middle lobe, presumably post infectious or inflammatory scarring. No acute  consolidative airspace disease is noted on today's examination. No pleural effusions. No evidence of pulmonary edema. Heart size is normal. Mediastinal contours are unremarkable. Atherosclerosis in the thoracic aorta. Orthopedic fixation hardware in the lower cervical spine.  IMPRESSION: 1. No radiographic evidence of acute cardiopulmonary disease. 2. Mild post infectious or inflammatory scarring in the right middle lobe. 3. Atherosclerosis.   Electronically Signed   By: Vinnie Langton M.D.   On: 12/31/2013 17:04    Electrocardiogram: Sinus rhythm at 72 beats per minute. Normal axis. Intervals are normal. No Q waves. No concerning ST or T-wave changes are noted.  Problem List  Principal Problem:   Nausea & vomiting Active Problems:   Postmenopausal bleeding   VIN III (vulvar intraepithelial neoplasia III)   COPD GOLD III   History of stroke   Right lower lobe lung mass, Sq cell Ca ? IIIB vs IV   Abdominal  pain, other specified site   UTI (lower urinary tract infection)   Assessment: This is a 68 year old, Caucasian female, who presents with nausea and vomiting along with abdominal pain, ongoing for the last 2-3 weeks. She is a history of lung cancer and vulvar cancer. She hasn't had a CT of her abdomen, pelvis, recently. She did have a PET scan back in October, which did not reveal any hypermetabolic lesions in the abdomen. She has completed chemotherapy and radiation treatment for her squamous cell lung cancer. Differential diagnosis for her symptomatology is broad and includes metastatic disease in the abdomen, Gastritis, peptic ulcer disease, UTI. EKG is normal. Lipase is normal.  Plan: #1 nausea with vomiting, and abdominal pain: Considering her history we will proceed with CT scan of the abdomen and pelvis. Her UA was abnormal, suggestive of UTI. We will treat her with antibiotics. We will give her symptomatic treatment as well. Give her PPI.  #2 urinary tract infection: See above.  Start ceftriaxone and follow up on Urine cultures.  #3 history of squamous cell lung cancer: She has completed chemotherapy and radiation in December. She was supposed to get another CT scan of her chest this Monday. Since we are getting CT of abdomen/pelvis, we will proceed with CT of the chest as well.  #4 history of COPD: Continue with her inhalers. Continue nicotine patch.  #5 history of vulvar cancer: She is experiencing some vaginal spotting. It is not a significant bleeding. She should followup with her GYN oncologist, Dr. Skeet Latch.   DVT Prophylaxis: Heparin Code Status: Full code Family Communication: Discussed with the patient  Disposition Plan: Observe   Further management decisions will depend on results of further testing and patient's response to treatment.  [facility]  [facility] Pager [phone]  If 7PM-7AM, please contact night-coverage [email or web address] Password [email or web address]  12/31/2013, 7:50 PM

## 2013-12-31 NOTE — ED Notes (Signed)
Patient requesting something for nausea-Dr [name] made aware.

## 2013-12-31 NOTE — ED Notes (Signed)
Pt reports had chemo treatment the week before Christmas.  C/O n/v x 10days.  Reports started having some vaginal bleeding that started yesterday.  Describes as "spotting."  Pt c/o chronic chest pain and also c/o pain in both knees.

## 2014-01-01 LAB — COMPREHENSIVE METABOLIC PANEL
ALBUMIN: 3.1 g/dL — AB (ref 3.5–5.2)
ALK PHOS: 100 U/L (ref 39–117)
ALT: 7 U/L (ref 0–35)
AST: 12 U/L (ref 0–37)
BUN: 7 mg/dL (ref 6–23)
CALCIUM: 8 mg/dL — AB (ref 8.4–10.5)
CO2: 27 mEq/L (ref 19–32)
CREATININE: 0.69 mg/dL (ref 0.50–1.10)
Chloride: 105 mEq/L (ref 96–112)
GFR calc non Af Amer: 89 mL/min — ABNORMAL LOW (ref >90)
GLUCOSE: 96 mg/dL (ref 70–99)
POTASSIUM: 3.7 meq/L (ref 3.7–5.3)
Sodium: 139 mEq/L (ref 137–147)
TOTAL PROTEIN: 5.9 g/dL — AB (ref 6.0–8.3)
Total Bilirubin: 0.2 mg/dL — ABNORMAL LOW (ref 0.3–1.2)

## 2014-01-01 LAB — CBC
HCT: 33.6 % — ABNORMAL LOW (ref 36.0–46.0)
HEMOGLOBIN: 11.3 g/dL — AB (ref 12.0–15.0)
MCH: 33.2 pg (ref 26.0–34.0)
MCHC: 33.6 g/dL (ref 30.0–36.0)
MCV: 98.8 fL (ref 78.0–100.0)
Platelets: 162 10*3/uL (ref 150–400)
RBC: 3.4 MIL/uL — ABNORMAL LOW (ref 3.87–5.11)
RDW: 16.3 % — AB (ref 11.5–15.5)
WBC: 2.8 10*3/uL — AB (ref 4.0–10.5)

## 2014-01-01 MED ORDER — CHLORZOXAZONE 500 MG PO TABS
500.0000 mg | ORAL_TABLET | Freq: Four times a day (QID) | ORAL | Status: DC | PRN
  Administered 2014-01-01: 500 mg via ORAL
  Filled 2014-01-01: qty 1

## 2014-01-01 MED ORDER — ALPRAZOLAM 1 MG PO TABS: 1.0000 mg | ORAL_TABLET | Freq: Four times a day (QID) | ORAL | Status: DC | PRN

## 2014-01-01 MED ORDER — LEVOFLOXACIN 500 MG PO TABS: 500.0000 mg | ORAL_TABLET | Freq: Every day | ORAL | Status: DC

## 2014-01-01 MED ORDER — CHLORZOXAZONE 500 MG PO TABS
ORAL_TABLET | ORAL | Status: AC
  Filled 2014-01-01: qty 1

## 2014-01-01 MED ORDER — ONDANSETRON 4 MG PO TBDP: 4.0000 mg | ORAL_TABLET | Freq: Three times a day (TID) | ORAL | Status: DC | PRN

## 2014-01-01 MED ORDER — OXYCODONE-ACETAMINOPHEN 10-325 MG PO TABS: 1.0000 | ORAL_TABLET | Freq: Four times a day (QID) | ORAL | Status: DC | PRN

## 2014-01-01 NOTE — Progress Notes (Signed)
PT states she cannot take Spiriva mdi , but takes tudorza bid 2 puffs daily, pt has meds from home.

## 2014-01-01 NOTE — Discharge Summary (Signed)
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 She was also on radiation treatments, and has completed that as well. She also has a history of vulvar cancer, for which she received surgery and laser treatment in 2014. She also has a history of hypertension, and arthritis. She was in her usual state of health about 2-3 weeks ago, when she started having nausea. It was mild at the beginning, but it has been getting progressively worse. She has had a few episodes of vomiting which has been not bloody. She has had poor appetite. She thinks she may have lost weight, but she's not sure. But she does admit to generalized weakness. She's had night sweats, but denies any fevers. No headaches   Hospital Course:  1. nausea with vomiting, and abdominal pain resolved, tolerating diet well; abd exam no s/s of acute abdomen, CT abd no acute findings; cont supportive care, outpatient follow up  2.  urinary tract infection: cont empiric atx, afebrile, no leukocytosis; f/u outpatient with cultures next  week   3. history of squamous cell lung cancer: She has completed chemotherapy and radiation in December. CT: Known right lung mass, smaller when compared to prior study; cont outpatient follow up with oncology    Procedures:  None  (i.e. Studies not automatically included, echos, thoracentesis, etc; not x-rays)  Consultations:  None   Discharge Exam: Filed Vitals:   01/01/14 0609  BP: 125/60  Pulse: 72  Temp: 98.7 F (37.1 C)  Resp: 16    General: alert Cardiovascular: s1,s2 rrr Respiratory: CTA BL  Discharge Instructions  Discharge Orders   Future Appointments Provider Department Dept Phone   01/03/2014 9:30 AM [facility] [place] [phone]   01/05/2014 1:30 PM Curt Bears, MD [place] [phone]   01/27/2014 1:30 PM Janie Morning, MD [place] [phone]   Future Orders Complete By Expires   Diet - low sodium heart healthy  As directed    Discharge instructions  As directed    Comments:     Please follow up with primary care doctor in 1-2 week   Increase activity slowly  As directed        Medication List         albuterol 108 (90 BASE) MCG/ACT inhaler  Commonly known as:  VENTOLIN HFA  Inhale 2 puffs into the lungs every 6 (six) hours as needed for wheezing or shortness of breath.  albuterol (2.5 MG/3ML) 0.083% nebulizer solution  Commonly known as:  PROVENTIL  Take 2.5 mg by nebulization as needed for wheezing.     ALPRAZolam 1 MG tablet  Commonly known as:  XANAX  Take 1 tablet (1 mg total) by mouth every 6 (six) hours as needed for anxiety.     amLODipine 5 MG tablet  Commonly known as:  NORVASC  Take 1 tablet (5 mg total) by mouth daily.     atenolol 50 MG tablet  Commonly known as:  TENORMIN  Take 1 tablet (50 mg total) by mouth daily.     chlorzoxazone 500 MG tablet  Commonly known as:  PARAFON  Take 500 mg by mouth 4  (four) times daily as needed for muscle spasms.     dextromethorphan-guaiFENesin 30-600 MG per 12 hr tablet  Commonly known as:  MUCINEX DM  Take 1 tablet by mouth every 12 (twelve) hours.     diphenhydrAMINE 25 mg capsule  Commonly known as:  BENADRYL  Take 25 mg by mouth 2 (two) times daily as needed for itching.     esomeprazole 40 MG capsule  Commonly known as:  NEXIUM  Take 40 mg by mouth daily.     levofloxacin 500 MG tablet  Commonly known as:  LEVAQUIN  Take 1 tablet (500 mg total) by mouth daily.     nicotine 14 mg/24hr patch  Commonly known as:  NICODERM CQ - dosed in mg/24 hours  Place 1 patch onto the skin daily.     ondansetron 4 MG disintegrating tablet  Commonly known as:  ZOFRAN ODT  Take 1 tablet (4 mg total) by mouth every 8 (eight) hours as needed for nausea or vomiting.     oxyCODONE-acetaminophen 10-325 MG per tablet  Commonly known as:  PERCOCET  Take 1 tablet by mouth every 6 (six) hours as needed for pain.     prochlorperazine 10 MG tablet  Commonly known as:  COMPAZINE  Take 1 tablet (10 mg total) by mouth every 6 (six) hours as needed for nausea.     sertraline 100 MG tablet  Commonly known as:  ZOLOFT  Take 200 mg by mouth daily.     sucralfate 1 G tablet  Commonly known as:  CARAFATE  Take 1 tablet (1 g total) by mouth 4 (four) times daily.     trazodone 300 MG tablet  Commonly known as:  DESYREL  Take 300 mg by mouth at bedtime.     TUDORZA PRESSAIR 400 MCG/ACT Aepb  Generic drug:  Aclidinium Bromide  Inhale 1 puff into the lungs 2 (two) times daily.       Allergies  Allergen Reactions  . Prednisone     insomnia  . Nsaids     GI Upset  . Aspirin Other (See Comments)    Gi symptoms. Does NOT take ibuprofen or other NSAIDS  . Dulera [Mometasone Furo-Formoterol Fum] Palpitations    Panting " my body was twisted inside and out"       Follow-up Information   Follow up with [name], MD In 1 week.   Specialty:  Internal  Medicine   Contact information:    [place] [address] [phone]        The results of significant diagnostics from this hospitalization (including imaging, microbiology, ancillary and laboratory) are listed below for reference.    Significant Diagnostic Studies: Dg Chest 2 View  12/31/2013   CLINICAL DATA:  Cough, nausea and vomiting, intermittent fever and weakness.  EXAM: CHEST  2 VIEW  COMPARISON:  Chest x-ray 11/22/2013.  FINDINGS: Linear opacities are noted in the region of the right middle lobe, presumably post infectious or inflammatory scarring. No acute consolidative airspace disease is noted on today's examination. No pleural effusions. No evidence of pulmonary edema. Heart size is normal. Mediastinal contours are unremarkable. Atherosclerosis in the thoracic aorta. Orthopedic fixation hardware in the lower cervical spine.  IMPRESSION: 1. No radiographic evidence of acute cardiopulmonary disease. 2. Mild post infectious or inflammatory scarring in the right middle lobe. 3. Atherosclerosis.   Electronically Signed   By: Vinnie Langton M.D.   On: 12/31/2013 17:04   Ct Chest W Contrast  12/31/2013   CLINICAL DATA:  Bilateral chest pain with nausea and vomiting, history of lung cancer  EXAM: CT CHEST, ABDOMEN, AND PELVIS WITH CONTRAST  TECHNIQUE: Multidetector CT imaging of the chest, abdomen and pelvis was performed following the standard protocol during bolus administration of intravenous contrast.  CONTRAST:  129mL OMNIPAQUE IOHEXOL 300 MG/ML  SOLN  COMPARISON:  10/07/2013 PET scan  FINDINGS: CT CHEST FINDINGS  There is a right infrahilar lung mass measuring 26 x 29 mm, previously measuring 48 x 41 mm. This mass appears to involve right lower lobe bronchus with soft tissue within the bronchus, a similar appearance to prior study. There is aortic and coronary arterial calcification.  There are numerous small mediastinal lymph nodes. These are seen in the pretracheal and  precarinal areas and are not significantly changed when compared to the prior study. The thoracic aorta shows no evidence of dissection or dilatation. Main pulmonary arteries appear grossly normal although this study is not a CT pulmonary arteriogram. There is no other significant mediastinal or hilar adenopathy. There is no pericardial or pleural effusion.  The left lung is clear. Other than the mass described above, the right lung is clear except for a 4 mm pulmonary nodule series 3, image 33, located in the right middle lobe. There is discoid atelectasis in the right lower lobe.  CT ABDOMEN AND PELVIS FINDINGS  There is mild biliary dilatation in the liver. Common bile duct is mildly prominent in the region of the pancreas, measuring 10 mm. No pancreatic head mass is identified. Gallbladder is normal. Spleen is normal. Pancreas is normal. Adrenal glands are normal. Left kidney shows a small extrarenal pelvis but is otherwise normal. Right kidney demonstrates a 3 mm nonobstructing midpole stone. The distal infrarenal abdominal aorta is mildly dilated just above the bifurcation, to a diameter of 25 x 26 mm. There is extensive aortoiliac calcification.  There is a nonobstructive bowel gas pattern. There are air-fluid levels in the sigmoid colon, this suggests a diarrhea type illness. There is no ascites. Bladder is normal. Reproductive organs are not identified. Appendix is normal. There is postoperative change in lumbar spine. There are no acute musculoskeletal findings.  IMPRESSION: 1. Known right lung mass, smaller when compared to prior study. Tiny right middle lobe pulmonary nodule, indeterminate. No acute abnormalities in the thorax. 2. Mild biliary dilatation. Significance uncertain. Correlate with appropriate laboratory values. 3. Stable mild distal aortic dilatation. 4. Air-fluid levels in the distal colon. Nonobstructive gas pattern. Findings suggest a diarrhea type illness.   Electronically Signed   By:  Skipper Cliche M.D.   On: 12/31/2013 21:02   Ct Abdomen Pelvis W Contrast  12/31/2013   CLINICAL DATA:  Bilateral chest pain with nausea and vomiting, history of lung  cancer  EXAM: CT CHEST, ABDOMEN, AND PELVIS WITH CONTRAST  TECHNIQUE: Multidetector CT imaging of the chest, abdomen and pelvis was performed following the standard protocol during bolus administration of intravenous contrast.  CONTRAST:  173mL OMNIPAQUE IOHEXOL 300 MG/ML  SOLN  COMPARISON:  10/07/2013 PET scan  FINDINGS: CT CHEST FINDINGS  There is a right infrahilar lung mass measuring 26 x 29 mm, previously measuring 48 x 41 mm. This mass appears to involve right lower lobe bronchus with soft tissue within the bronchus, a similar appearance to prior study. There is aortic and coronary arterial calcification.  There are numerous small mediastinal lymph nodes. These are seen in the pretracheal and precarinal areas and are not significantly changed when compared to the prior study. The thoracic aorta shows no evidence of dissection or dilatation. Main pulmonary arteries appear grossly normal although this study is not a CT pulmonary arteriogram. There is no other significant mediastinal or hilar adenopathy. There is no pericardial or pleural effusion.  The left lung is clear. Other than the mass described above, the right lung is clear except for a 4 mm pulmonary nodule series 3, image 33, located in the right middle lobe. There is discoid atelectasis in the right lower lobe.  CT ABDOMEN AND PELVIS FINDINGS  There is mild biliary dilatation in the liver. Common bile duct is mildly prominent in the region of the pancreas, measuring 10 mm. No pancreatic head mass is identified. Gallbladder is normal. Spleen is normal. Pancreas is normal. Adrenal glands are normal. Left kidney shows a small extrarenal pelvis but is otherwise normal. Right kidney demonstrates a 3 mm nonobstructing midpole stone. The distal infrarenal abdominal aorta is mildly dilated  just above the bifurcation, to a diameter of 25 x 26 mm. There is extensive aortoiliac calcification.  There is a nonobstructive bowel gas pattern. There are air-fluid levels in the sigmoid colon, this suggests a diarrhea type illness. There is no ascites. Bladder is normal. Reproductive organs are not identified. Appendix is normal. There is postoperative change in lumbar spine. There are no acute musculoskeletal findings.  IMPRESSION: 1. Known right lung mass, smaller when compared to prior study. Tiny right middle lobe pulmonary nodule, indeterminate. No acute abnormalities in the thorax. 2. Mild biliary dilatation. Significance uncertain. Correlate with appropriate laboratory values. 3. Stable mild distal aortic dilatation. 4. Air-fluid levels in the distal colon. Nonobstructive gas pattern. Findings suggest a diarrhea type illness.   Electronically Signed   By: Skipper Cliche M.D.   On: 12/31/2013 21:02    Microbiology: No results found for this or any previous visit (from the past 240 hour(s)).   Labs: Basic Metabolic Panel:  Recent Labs Lab 12/31/13 1322 01/01/14 0614  NA 141 139  K 3.5* 3.7  CL 104 105  CO2 27 27  GLUCOSE 101* 96  BUN 8 7  CREATININE 0.72 0.69  CALCIUM 9.0 8.0*   Liver Function Tests:  Recent Labs Lab 12/31/13 1322 01/01/14 0614  AST 15 12  ALT 7 7  ALKPHOS 124* 100  BILITOT 0.3 0.2*  PROT 7.4 5.9*  ALBUMIN 3.9 3.1*    Recent Labs Lab 12/31/13 1322  LIPASE 25   No results found for this basename: AMMONIA,  in the last 168 hours CBC:  Recent Labs Lab 12/31/13 1322 01/01/14 0614  WBC 4.4 2.8*  NEUTROABS 3.2  --   HGB 13.3 11.3*  HCT 39.7 33.6*  MCV 97.3 98.8  PLT 208 162   Cardiac Enzymes: No  results found for this basename: CKTOTAL, CKMB, CKMBINDEX, TROPONINI,  in the last 168 hours BNP: BNP (last 3 results) No results found for this basename: PROBNP,  in the last 8760 hours CBG: No results found for this basename: GLUCAP,  in the  last 168 hours     Signed:  Kinnie Feil  [facility] 01/01/2014, 8:35 AM

## 2014-01-02 LAB — URINE CULTURE: Colony Count: >=100000

## 2014-01-03 ENCOUNTER — Other Ambulatory Visit (HOSPITAL_BASED_OUTPATIENT_CLINIC_OR_DEPARTMENT_OTHER): Payer: Medicare Other

## 2014-01-03 ENCOUNTER — Ambulatory Visit (HOSPITAL_COMMUNITY)
Admission: RE | Admit: 2014-01-03 | Discharge: 2014-01-03 | Disposition: A | Discharge status: Home / Self Care | Payer: Medicare Other | Source: Ambulatory Visit | Attending: Physician Assistant | Admitting: Physician Assistant

## 2014-01-03 DIAGNOSIS — I2510 Atherosclerotic heart disease of native coronary artery without angina pectoris: Secondary | ICD-10-CM | POA: Insufficient documentation

## 2014-01-03 DIAGNOSIS — C3430 Malignant neoplasm of lower lobe, unspecified bronchus or lung: Secondary | ICD-10-CM

## 2014-01-03 DIAGNOSIS — J438 Other emphysema: Secondary | ICD-10-CM | POA: Insufficient documentation

## 2014-01-03 DIAGNOSIS — Z9221 Personal history of antineoplastic chemotherapy: Secondary | ICD-10-CM | POA: Insufficient documentation

## 2014-01-03 DIAGNOSIS — R918 Other nonspecific abnormal finding of lung field: Secondary | ICD-10-CM | POA: Insufficient documentation

## 2014-01-03 DIAGNOSIS — Z923 Personal history of irradiation: Secondary | ICD-10-CM | POA: Insufficient documentation

## 2014-01-03 LAB — COMPREHENSIVE METABOLIC PANEL (CC13)
ALBUMIN: 3.4 g/dL — AB (ref 3.5–5.0)
ALT: 9 U/L (ref 0–55)
AST: 13 U/L (ref 5–34)
Alkaline Phosphatase: 113 U/L (ref 40–150)
Anion Gap: 10 mEq/L (ref 3–11)
BUN: 6.6 mg/dL — AB (ref 7.0–26.0)
CHLORIDE: 104 meq/L (ref 98–109)
CO2: 27 mEq/L (ref 22–29)
CREATININE: 0.8 mg/dL (ref 0.6–1.1)
Calcium: 8.7 mg/dL (ref 8.4–10.4)
GLUCOSE: 117 mg/dL (ref 70–140)
POTASSIUM: 3.8 meq/L (ref 3.5–5.1)
Sodium: 141 mEq/L (ref 136–145)
Total Bilirubin: 0.26 mg/dL (ref 0.20–1.20)
Total Protein: 6.5 g/dL (ref 6.4–8.3)

## 2014-01-03 LAB — CBC WITH DIFFERENTIAL/PLATELET
BASO%: 1.3 % (ref 0.0–2.0)
Basophils Absolute: 0.1 10*3/uL (ref 0.0–0.1)
EOS ABS: 0.1 10*3/uL (ref 0.0–0.5)
EOS%: 3.5 % (ref 0.0–7.0)
HEMATOCRIT: 35.1 % (ref 34.8–46.6)
HGB: 12 g/dL (ref 11.6–15.9)
LYMPH%: 12.2 % — ABNORMAL LOW (ref 14.0–49.7)
MCH: 33.7 pg (ref 25.1–34.0)
MCHC: 34.1 g/dL (ref 31.5–36.0)
MCV: 98.8 fL (ref 79.5–101.0)
MONO#: 0.3 10*3/uL (ref 0.1–0.9)
MONO%: 7.7 % (ref 0.0–14.0)
NEUT%: 75.3 % (ref 38.4–76.8)
NEUTROS ABS: 3 10*3/uL (ref 1.5–6.5)
Platelets: 182 10*3/uL (ref 145–400)
RBC: 3.55 10*6/uL — ABNORMAL LOW (ref 3.70–5.45)
RDW: 18 % — AB (ref 11.2–14.5)
WBC: 4 10*3/uL (ref 3.9–10.3)
lymph#: 0.5 10*3/uL — ABNORMAL LOW (ref 0.9–3.3)

## 2014-01-03 MED ORDER — IOHEXOL 300 MG/ML  SOLN
80.0000 mL | Freq: Once | INTRAMUSCULAR | Status: AC | PRN
  Administered 2014-01-03: 80 mL via INTRAVENOUS

## 2014-01-05 ENCOUNTER — Ambulatory Visit: Payer: Medicare Other | Admitting: Internal Medicine

## 2014-01-05 ENCOUNTER — Ambulatory Visit: Admission: RE | Admit: 2014-01-05 | Payer: Medicare Other | Source: Ambulatory Visit | Admitting: Radiation Oncology

## 2014-01-05 HISTORY — DX: Personal history of irradiation: Z92.3

## 2014-01-12 ENCOUNTER — Other Ambulatory Visit: Payer: Self-pay | Admitting: *Deleted

## 2014-01-12 NOTE — Progress Notes (Signed)
Pt called wanting to r/s appt with [facility] she missed on 1/7.  She wants it to be on the same say at appt with Dr Lisbeth Renshaw on 1/22.  Onc tx schedule filled out.  SLJ

## 2014-01-14 ENCOUNTER — Telehealth: Payer: Self-pay | Admitting: *Deleted

## 2014-01-14 NOTE — Telephone Encounter (Signed)
sw pt gv appt for 01/24/14 @2pm . Pt is aware...td

## 2014-01-18 ENCOUNTER — Ambulatory Visit: Payer: Medicare Other | Admitting: Internal Medicine

## 2014-01-20 ENCOUNTER — Encounter: Payer: Self-pay | Admitting: Radiation Oncology

## 2014-01-20 ENCOUNTER — Ambulatory Visit
Admission: RE | Admit: 2014-01-20 | Discharge: 2014-01-20 | Disposition: A | Discharge status: Home / Self Care | Payer: Medicare Other | Source: Ambulatory Visit | Attending: Radiation Oncology | Admitting: Radiation Oncology

## 2014-01-20 VITALS — BP 144/74 | HR 73 | Temp 98.4°F | Resp 22 | Ht 66.0 in | Wt 182.5 lb

## 2014-01-20 DIAGNOSIS — C3430 Malignant neoplasm of lower lobe, unspecified bronchus or lung: Secondary | ICD-10-CM

## 2014-01-20 NOTE — Progress Notes (Signed)
Radiation Oncology         [phone] ________________________________  Name: [name] MRN: [id number]  Date: 01/20/2014  DOB: December 19, 1946  Follow-Up Visit Note  CC: [name], MD  Tanda Rockers, MD  Diagnosis:   Non-small cell lung cancer  Interval Since Last Radiation:  One month   Narrative:  The patient returns today for routine follow-up.  The patient completed chemoradiotherapy on 12/06/2013 to 66 gray. She has been hospitalized since that time with dehydration and poor appetite. She had nausea but this did resolve during her hospital stay. This has continued to be somewhat of an issue but has been improved. The patient also lost her mother over the last several weeks and this obviously also is profoundly affecting the patient.  The patient denies significant esophagitis at this time. This markedly improved after treatment. The patient underwent a repeat CT scan of the chest on 01/03/2014.                    ALLERGIES:  is allergic to prednisone; nsaids; aspirin; and dulera.  Meds: Current Outpatient Prescriptions  Medication Sig Dispense Refill  . Aclidinium Bromide (TUDORZA PRESSAIR) 400 MCG/ACT AEPB Inhale 1 puff into the lungs 2 (two) times daily.      [name] albuterol (PROVENTIL) (2.5 MG/3ML) 0.083% nebulizer solution Take 2.5 mg by nebulization as needed for wheezing.      [name] albuterol (VENTOLIN HFA) 108 (90 BASE) MCG/ACT inhaler Inhale 2 puffs into the lungs every 6 (six) hours as needed for wheezing or shortness of breath.  1 Inhaler  11  . ALPRAZolam (XANAX) 1 MG tablet Take 1 tablet (1 mg total) by mouth every 6 (six) hours as needed for anxiety.  30 tablet  0  . atenolol (TENORMIN) 50 MG tablet Take 1 tablet (50 mg total) by mouth daily.  30 tablet  0  . chlorzoxazone (PARAFON) 500 MG tablet Take 500 mg by mouth 4 (four) times daily as needed for muscle spasms.      [name] dextromethorphan-guaiFENesin (MUCINEX DM) 30-600 MG per 12 hr tablet Take 1 tablet by mouth every  12 (twelve) hours.      . diphenhydrAMINE (BENADRYL) 25 mg capsule Take 25 mg by mouth 2 (two) times daily as needed for itching.       . esomeprazole (NEXIUM) 40 MG capsule Take 40 mg by mouth daily.       . ondansetron (ZOFRAN ODT) 4 MG disintegrating tablet Take 1 tablet (4 mg total) by mouth every 8 (eight) hours as needed for nausea or vomiting.  20 tablet  0  . oxyCODONE-acetaminophen (PERCOCET) 10-325 MG per tablet Take 1 tablet by mouth every 6 (six) hours as needed for pain.  60 tablet  0  . sertraline (ZOLOFT) 100 MG tablet Take 200 mg by mouth daily.       . sucralfate (CARAFATE) 1 G tablet Take 1 tablet (1 g total) by mouth 4 (four) times daily.  120 tablet  2  . trazodone (DESYREL) 300 MG tablet Take 300 mg by mouth at bedtime.      [name] amLODipine (NORVASC) 5 MG tablet Take 1 tablet (5 mg total) by mouth daily.  30 tablet  0  . nicotine (NICODERM CQ - DOSED IN MG/24 HOURS) 14 mg/24hr patch Place 1 patch onto the skin daily.  28 patch  0   No current facility-administered medications for this encounter.    Physical Findings: The patient is in  no acute distress. Patient is alert and oriented.  height is 5\' 6"  (1.676 m) and weight is 182 lb 8 oz (82.781 kg). Her oral temperature is 98.4 F (36.9 C). Her blood pressure is 144/74 and her pulse is 73. Her respiration is 22 and oxygen saturation is 94%. .   General: Well-developed, in no acute distress HEENT: Normocephalic, atraumatic Cardiovascular: Regular rate and rhythm Respiratory: Scattered rhonchi bilaterally GI: Soft, nontender, normal bowel sounds Extremities: No edema present   Lab Findings: Lab Results  Component Value Date   WBC 4.0 01/03/2014   HGB 12.0 01/03/2014   HCT 35.1 01/03/2014   MCV 98.8 01/03/2014   PLT 182 01/03/2014     Radiographic Findings: Dg Chest 2 View  12/31/2013   CLINICAL DATA:  Cough, nausea and vomiting, intermittent fever and weakness.  EXAM: CHEST  2 VIEW  COMPARISON:  Chest x-ray 11/22/2013.   FINDINGS: Linear opacities are noted in the region of the right middle lobe, presumably post infectious or inflammatory scarring. No acute consolidative airspace disease is noted on today's examination. No pleural effusions. No evidence of pulmonary edema. Heart size is normal. Mediastinal contours are unremarkable. Atherosclerosis in the thoracic aorta. Orthopedic fixation hardware in the lower cervical spine.  IMPRESSION: 1. No radiographic evidence of acute cardiopulmonary disease. 2. Mild post infectious or inflammatory scarring in the right middle lobe. 3. Atherosclerosis.   Electronically Signed   By: Vinnie Langton M.D.   On: 12/31/2013 17:04   Ct Chest W Contrast  01/03/2014   CLINICAL DATA:  Restaging non-small cell lung cancer. Chemotherapy and radiation therapy completed.  EXAM: CT CHEST WITH CONTRAST  TECHNIQUE: Multidetector CT imaging of the chest was performed during intravenous contrast administration.  CONTRAST:  40mL OMNIPAQUE IOHEXOL 300 MG/ML  SOLN  COMPARISON:  12/31/2013 and 09/04/2013.  FINDINGS: Mediastinal lymph nodes are subcentimeter in short axis size. No left hilar or axillary adenopathy. Atherosclerotic calcification of the arterial vasculature, including three-vessel involvement of the coronary arteries. Heart size normal. Small amount of pericardial fluid may be physiologic.  Debris is seen in the right middle and right lower lobe bronchi with surrounding low-attenuation soft tissue lesion in the right lower lobe, measuring approximately 2.5 x 2.9 cm (image 37), previously 3.9 x 4.3 cm, when remeasured. Centrilobular emphysema. Scattered pulmonary nodular densities measure 4 mm or less in size and are unchanged from 09/04/2013. Mild postobstructive opacity and/or radiation changes in the right middle lobe and right lower lobe. Mild volume loss in the left lower lobe. No pleural fluid. Airway is otherwise unremarkable.  Incidental imaging of the upper abdomen shows no acute  findings. Mild intrahepatic biliary duct dilatation is unchanged. Adrenal glands are unremarkable. No worrisome lytic or sclerotic lesions. Degenerative changes are seen in the spine.  IMPRESSION: 1. Right lower lobe mass is smaller when compared with 09/04/2013. Associated internal debris and obstruction involving the right middle and right lower lobe bronchi. 2. Scattered tiny pulmonary nodular densities are unchanged. 3. Extensive 3 vessel coronary artery calcification.   Electronically Signed   By: Lorin Picket M.D.   On: 01/03/2014 11:54   Ct Chest W Contrast  12/31/2013   CLINICAL DATA:  Bilateral chest pain with nausea and vomiting, history of lung cancer  EXAM: CT CHEST, ABDOMEN, AND PELVIS WITH CONTRAST  TECHNIQUE: Multidetector CT imaging of the chest, abdomen and pelvis was performed following the standard protocol during bolus administration of intravenous contrast.  CONTRAST:  111mL OMNIPAQUE IOHEXOL 300 MG/ML  SOLN  COMPARISON:  10/07/2013 PET scan  FINDINGS: CT CHEST FINDINGS  There is a right infrahilar lung mass measuring 26 x 29 mm, previously measuring 48 x 41 mm. This mass appears to involve right lower lobe bronchus with soft tissue within the bronchus, a similar appearance to prior study. There is aortic and coronary arterial calcification.  There are numerous small mediastinal lymph nodes. These are seen in the pretracheal and precarinal areas and are not significantly changed when compared to the prior study. The thoracic aorta shows no evidence of dissection or dilatation. Main pulmonary arteries appear grossly normal although this study is not a CT pulmonary arteriogram. There is no other significant mediastinal or hilar adenopathy. There is no pericardial or pleural effusion.  The left lung is clear. Other than the mass described above, the right lung is clear except for a 4 mm pulmonary nodule series 3, image 33, located in the right middle lobe. There is discoid atelectasis in the  right lower lobe.  CT ABDOMEN AND PELVIS FINDINGS  There is mild biliary dilatation in the liver. Common bile duct is mildly prominent in the region of the pancreas, measuring 10 mm. No pancreatic head mass is identified. Gallbladder is normal. Spleen is normal. Pancreas is normal. Adrenal glands are normal. Left kidney shows a small extrarenal pelvis but is otherwise normal. Right kidney demonstrates a 3 mm nonobstructing midpole stone. The distal infrarenal abdominal aorta is mildly dilated just above the bifurcation, to a diameter of 25 x 26 mm. There is extensive aortoiliac calcification.  There is a nonobstructive bowel gas pattern. There are air-fluid levels in the sigmoid colon, this suggests a diarrhea type illness. There is no ascites. Bladder is normal. Reproductive organs are not identified. Appendix is normal. There is postoperative change in lumbar spine. There are no acute musculoskeletal findings.  IMPRESSION: 1. Known right lung mass, smaller when compared to prior study. Tiny right middle lobe pulmonary nodule, indeterminate. No acute abnormalities in the thorax. 2. Mild biliary dilatation. Significance uncertain. Correlate with appropriate laboratory values. 3. Stable mild distal aortic dilatation. 4. Air-fluid levels in the distal colon. Nonobstructive gas pattern. Findings suggest a diarrhea type illness.   Electronically Signed   By: Skipper Cliche M.D.   On: 12/31/2013 21:02   Ct Abdomen Pelvis W Contrast  12/31/2013   CLINICAL DATA:  Bilateral chest pain with nausea and vomiting, history of lung cancer  EXAM: CT CHEST, ABDOMEN, AND PELVIS WITH CONTRAST  TECHNIQUE: Multidetector CT imaging of the chest, abdomen and pelvis was performed following the standard protocol during bolus administration of intravenous contrast.  CONTRAST:  132mL OMNIPAQUE IOHEXOL 300 MG/ML  SOLN  COMPARISON:  10/07/2013 PET scan  FINDINGS: CT CHEST FINDINGS  There is a right infrahilar lung mass measuring 26 x 29 mm,  previously measuring 48 x 41 mm. This mass appears to involve right lower lobe bronchus with soft tissue within the bronchus, a similar appearance to prior study. There is aortic and coronary arterial calcification.  There are numerous small mediastinal lymph nodes. These are seen in the pretracheal and precarinal areas and are not significantly changed when compared to the prior study. The thoracic aorta shows no evidence of dissection or dilatation. Main pulmonary arteries appear grossly normal although this study is not a CT pulmonary arteriogram. There is no other significant mediastinal or hilar adenopathy. There is no pericardial or pleural effusion.  The left lung is clear. Other than the mass described above, the right  lung is clear except for a 4 mm pulmonary nodule series 3, image 33, located in the right middle lobe. There is discoid atelectasis in the right lower lobe.  CT ABDOMEN AND PELVIS FINDINGS  There is mild biliary dilatation in the liver. Common bile duct is mildly prominent in the region of the pancreas, measuring 10 mm. No pancreatic head mass is identified. Gallbladder is normal. Spleen is normal. Pancreas is normal. Adrenal glands are normal. Left kidney shows a small extrarenal pelvis but is otherwise normal. Right kidney demonstrates a 3 mm nonobstructing midpole stone. The distal infrarenal abdominal aorta is mildly dilated just above the bifurcation, to a diameter of 25 x 26 mm. There is extensive aortoiliac calcification.  There is a nonobstructive bowel gas pattern. There are air-fluid levels in the sigmoid colon, this suggests a diarrhea type illness. There is no ascites. Bladder is normal. Reproductive organs are not identified. Appendix is normal. There is postoperative change in lumbar spine. There are no acute musculoskeletal findings.  IMPRESSION: 1. Known right lung mass, smaller when compared to prior study. Tiny right middle lobe pulmonary nodule, indeterminate. No acute  abnormalities in the thorax. 2. Mild biliary dilatation. Significance uncertain. Correlate with appropriate laboratory values. 3. Stable mild distal aortic dilatation. 4. Air-fluid levels in the distal colon. Nonobstructive gas pattern. Findings suggest a diarrhea type illness.   Electronically Signed   By: Skipper Cliche M.D.   On: 12/31/2013 21:02    Impression:    The patient is status post chemoradiotherapy for her diagnosis of locally advanced non-small cell lung cancer. She completed radiation treatment on 12/07/2003. I have personally reviewed the patient's scan of the chest on 01/03/2013. This showed significant decrease in size of the right lower lobe mass, now measuring 2.5 x 2.9 cm versus 3.9 x 4.3 cm previously.  Plan:  The patient is scheduled to see Dr. Julien Nordmann in the near future where they will discuss her repeat CT scan after chemoradiotherapy. I discussed with her that he will make recommendations at that time regarding possible additional systemic treatment. The patient will return to our clinic in 6 months.   Jodelle Gross, M.D., Ph.D.

## 2014-01-20 NOTE — Progress Notes (Addendum)
Follow up rd tx lung 10/18/13-12/06/13 sob with exertion, having stomach problems past 2 weeks, was in ED 2 weks ago dehydration , appetite poor, had grits and toast this am, coffee ice tea, takes zofran helps with nausea,  Fatigued, generalized pain all over taking percocet 10/325mg , legs very weak, coughing up phelgm yellowish, sob with mild exertion, feels like she's getting a cold stuffed up, taking breathing treatments  10:53 AM

## 2014-01-24 ENCOUNTER — Telehealth: Payer: Self-pay | Admitting: Internal Medicine

## 2014-01-24 ENCOUNTER — Encounter: Payer: Self-pay | Admitting: Internal Medicine

## 2014-01-24 ENCOUNTER — Ambulatory Visit (HOSPITAL_BASED_OUTPATIENT_CLINIC_OR_DEPARTMENT_OTHER): Payer: Medicare Other | Admitting: Internal Medicine

## 2014-01-24 VITALS — BP 151/80 | HR 83 | Temp 97.9°F | Resp 18 | Ht 66.0 in | Wt 180.8 lb

## 2014-01-24 DIAGNOSIS — C3490 Malignant neoplasm of unspecified part of unspecified bronchus or lung: Secondary | ICD-10-CM

## 2014-01-24 MED ORDER — AZITHROMYCIN 250 MG PO TABS: ORAL_TABLET | ORAL | Status: DC

## 2014-01-24 NOTE — Telephone Encounter (Signed)
gv and printed appt sched and avs for pt for April  °

## 2014-01-24 NOTE — Telephone Encounter (Signed)
pt had 2pm appt today adn wanted to know if she could have earlier time...moved pt to earlier time...pt ok and aware

## 2014-01-24 NOTE — Patient Instructions (Signed)
Smoking Cessation Quitting smoking is important to your health and has many advantages. However, it is not always easy to quit since nicotine is a very addictive drug. Often times, people try 3 times or more before being able to quit. This document explains the best ways for you to prepare to quit smoking. Quitting takes hard work and a lot of effort, but you can do it. ADVANTAGES OF QUITTING SMOKING  You will live longer, feel better, and live better.  Your body will feel the impact of quitting smoking almost immediately.  Within 20 minutes, blood pressure decreases. Your pulse returns to its normal level.  After 8 hours, carbon monoxide levels in the blood return to normal. Your oxygen level increases.  After 24 hours, the chance of having a heart attack starts to decrease. Your breath, hair, and body stop smelling like smoke.  After 48 hours, damaged nerve endings begin to recover. Your sense of taste and smell improve.  After 72 hours, the body is virtually free of nicotine. Your bronchial tubes relax and breathing becomes easier.  After 2 to 12 weeks, lungs can hold more air. Exercise becomes easier and circulation improves.  The risk of having a heart attack, stroke, cancer, or lung disease is greatly reduced.  After 1 year, the risk of coronary heart disease is cut in half.  After 5 years, the risk of stroke falls to the same as a nonsmoker.  After 10 years, the risk of lung cancer is cut in half and the risk of other cancers decreases significantly.  After 15 years, the risk of coronary heart disease drops, usually to the level of a nonsmoker.  If you are pregnant, quitting smoking will improve your chances of having a healthy baby.  The people you live with, especially any children, will be healthier.  You will have extra money to spend on things other than cigarettes. QUESTIONS TO THINK ABOUT BEFORE ATTEMPTING TO QUIT You may want to talk about your answers with your  caregiver.  Why do you want to quit?  If you tried to quit in the past, what helped and what did not?  What will be the most difficult situations for you after you quit? How will you plan to handle them?  Who can help you through the tough times? Your family? Friends? A caregiver?  What pleasures do you get from smoking? What ways can you still get pleasure if you quit? Here are some questions to ask your caregiver:  How can you help me to be successful at quitting?  What medicine do you think would be best for me and how should I take it?  What should I do if I need more help?  What is smoking withdrawal like? How can I get information on withdrawal? GET READY  Set a quit date.  Change your environment by getting rid of all cigarettes, ashtrays, matches, and lighters in your home, car, or work. Do not let people smoke in your home.  Review your past attempts to quit. Think about what worked and what did not. GET SUPPORT AND ENCOURAGEMENT You have a better chance of being successful if you have help. You can get support in many ways.  Tell your family, friends, and co-workers that you are going to quit and need their support. Ask them not to smoke around you.  Get individual, group, or telephone counseling and support. Programs are available at local hospitals and health centers. Call your local health department for   information about programs in your area.  Spiritual beliefs and practices may help some smokers quit.  Download a "quit meter" on your computer to keep track of quit statistics, such as how long you have gone without smoking, cigarettes not smoked, and money saved.  Get a self-help book about quitting smoking and staying off of tobacco. LEARN NEW SKILLS AND BEHAVIORS  Distract yourself from urges to smoke. Talk to someone, go for a walk, or occupy your time with a task.  Change your normal routine. Take a different route to work. Drink tea instead of coffee.  Eat breakfast in a different place.  Reduce your stress. Take a hot bath, exercise, or read a book.  Plan something enjoyable to do every day. Reward yourself for not smoking.  Explore interactive web-based programs that specialize in helping you quit. GET MEDICINE AND USE IT CORRECTLY Medicines can help you stop smoking and decrease the urge to smoke. Combining medicine with the above behavioral methods and support can greatly increase your chances of successfully quitting smoking.  Nicotine replacement therapy helps deliver nicotine to your body without the negative effects and risks of smoking. Nicotine replacement therapy includes nicotine gum, lozenges, inhalers, nasal sprays, and skin patches. Some may be available over-the-counter and others require a prescription.  Antidepressant medicine helps people abstain from smoking, but how this works is unknown. This medicine is available by prescription.  Nicotinic receptor partial agonist medicine simulates the effect of nicotine in your brain. This medicine is available by prescription. Ask your caregiver for advice about which medicines to use and how to use them based on your health history. Your caregiver will tell you what side effects to look out for if you choose to be on a medicine or therapy. Carefully read the information on the package. Do not use any other product containing nicotine while using a nicotine replacement product.  RELAPSE OR DIFFICULT SITUATIONS Most relapses occur within the first 3 months after quitting. Do not be discouraged if you start smoking again. Remember, most people try several times before finally quitting. You may have symptoms of withdrawal because your body is used to nicotine. You may crave cigarettes, be irritable, feel very hungry, cough often, get headaches, or have difficulty concentrating. The withdrawal symptoms are only temporary. They are strongest when you first quit, but they will go away within  10 14 days. To reduce the chances of relapse, try to:  Avoid drinking alcohol. Drinking lowers your chances of successfully quitting.  Reduce the amount of caffeine you consume. Once you quit smoking, the amount of caffeine in your body increases and can give you symptoms, such as a rapid heartbeat, sweating, and anxiety.  Avoid smokers because they can make you want to smoke.  Do not let weight gain distract you. Many smokers will gain weight when they quit, usually less than 10 pounds. Eat a healthy diet and stay active. You can always lose the weight gained after you quit.  Find ways to improve your mood other than smoking. FOR MORE INFORMATION  www.smokefree.gov  Document Released: 12/10/2001 Document Revised: 06/16/2012 Document Reviewed: 03/26/2012 [employer] Patient Information 2014 [employer]  

## 2014-01-24 NOTE — Progress Notes (Signed)
[facility] [phone]   Fax:([phone]) [place], [place]  [place] [address]  DIAGNOSIS: Locally advanced, likely stage IIIA (T2a., N2, M0) non-small cell lung cancer, squamous cell carcinoma diagnosed in September of 2014.   PRIOR THERAPY: Concurrent chemoradiation with weekly carboplatin for AUC of 2 and paclitaxel 45 mg/M2, status post 7 cycles, last cycle was given 11/29/2013 with partial response. First dose was given on 10/18/2013.    CURRENT THERAPY: None  CHEMOTHERAPY INTENT: Curative/control  CURRENT # OF CHEMOTHERAPY CYCLES: 0 CURRENT ANTIEMETICS: Zofran, Compazine and dexamethasone  CURRENT SMOKING STATUS: Current smoker and she was strongly advised to quit smoking and also to smoke cessation program  ORAL CHEMOTHERAPY AND CONSENT: None  CURRENT BISPHOSPHONATES USE: None  PAIN MANAGEMENT: 0/10  NARCOTICS INDUCED CONSTIPATION: None  LIVING WILL AND CODE STATUS: Full code  INTERVAL HISTORY: [name] 68 y.o. female returns to the clinic today for follow up visit. The patient s feeling fine today with no specific complaints except for chest congestion and cough productive of yellowish sputum. She denied having any significant chest pain but continues to have shortness of breath with exertion with no hemoptysis. She denied having any significant weight loss or night sweats. The patient unfortunately continues to smoke few cigarettes every day but she is trying to quit. She tolerated the previous course of concurrent chemoradiation fairly well except for sore throat. She has repeat CT scan of the chest performed recently and she is here for evaluation and discussion of her scan results.  MEDICAL HISTORY: Past Medical History  Diagnosis Date  . H/O: stroke 2012, 2000,     X3  . Hypertension   . Anxiety   . PTSD (post-traumatic stress disorder)   . Depression   . SVD (spontaneous vaginal  delivery)     x 1  . Smoker   . Osteoarthritis     knees, hips hands,   . Shortness of breath   . Pain     SEVERE PAIN BACK, RIGHT HIP AND KNEES--HARRINGTON RODS AND CERVICAL PLATES--USES WALKER OR CANE WHEN AMBULATING - GOES TO A PAIN CLINIC-STATES SHE NEEDS RT HIP AND BOTH KNEES REPLACED. PT STATES SHE WAS TOLD THE CEMENT AROUND THE HARRINGTON RODS IS CRACKED.  [name] Hot flashes, menopausal     SEVERE  . Family history of anesthesia complication     equipment failure caused problem  . Complication of anesthesia 03/02/13    severe headache,vertigo postop  . COPD (chronic obstructive pulmonary disease)     stable  . 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 Lung cancer   . Status post radiation therapy within four to twelve weeks 10/18/13-12/06/13    lung ca  66Gy    ALLERGIES:  is allergic to prednisone; nsaids; aspirin; and dulera.  MEDICATIONS:  Current Outpatient Prescriptions  Medication Sig Dispense Refill  . Aclidinium Bromide (TUDORZA PRESSAIR) 400 MCG/ACT AEPB Inhale 1 puff into the lungs 2 (two) times daily.      [name] albuterol (PROVENTIL) (2.5 MG/3ML) 0.083% nebulizer solution Take 2.5 mg by nebulization as needed for wheezing.      [name] albuterol (VENTOLIN HFA) 108 (90 BASE) MCG/ACT inhaler Inhale 2 puffs into the lungs every 6 (six) hours as needed for wheezing or shortness of breath.  1 Inhaler  11  . ALPRAZolam (XANAX) 1 MG tablet Take 1 tablet (1 mg total) by mouth  every 6 (six) hours as needed for anxiety.  30 tablet  0  . atenolol (TENORMIN) 50 MG tablet Take 1 tablet (50 mg total) by mouth daily.  30 tablet  0  . chlorzoxazone (PARAFON) 500 MG tablet Take 500 mg by mouth 4 (four) times daily as needed for muscle spasms.      [name] dextromethorphan-guaiFENesin (MUCINEX DM) 30-600 MG per 12 hr tablet Take 1 tablet by mouth every 12 (twelve) hours.      . diphenhydrAMINE (BENADRYL) 25 mg capsule Take 25 mg by mouth 2 (two) times daily as needed for itching.       . esomeprazole (NEXIUM)  40 MG capsule Take 40 mg by mouth daily.       . nicotine (NICODERM CQ - DOSED IN MG/24 HOURS) 14 mg/24hr patch Place 1 patch onto the skin daily as needed.      . ondansetron (ZOFRAN ODT) 4 MG disintegrating tablet Take 1 tablet (4 mg total) by mouth every 8 (eight) hours as needed for nausea or vomiting.  20 tablet  0  . oxyCODONE-acetaminophen (PERCOCET) 10-325 MG per tablet Take 1 tablet by mouth every 6 (six) hours as needed for pain.  60 tablet  0  . sertraline (ZOLOFT) 100 MG tablet Take 200 mg by mouth daily.       . sucralfate (CARAFATE) 1 G tablet Take 1 tablet (1 g total) by mouth 4 (four) times daily.  120 tablet  2  . trazodone (DESYREL) 300 MG tablet Take 300 mg by mouth at bedtime.       No current facility-administered medications for this visit.    SURGICAL HISTORY:  Past Surgical History  Procedure Laterality Date  . Carpal tunnel release      BILATERAL  . Nasal sinus surgery    . Lumbar fusion      L4-S1  . Cervical fusion    . Hernia repair    . Salpingoophorectomy Right   . Vaginal hysterectomy      TAH w/ ovary removal  . Transthoracic echocardiogram  12-03-2011    LVSF NORMAL/ EF 60-65%  . Vulvectomy N/A 03/02/2013    Procedure: WIDE LOCAL EXCISION VULVAR;  Surgeon: Imagene Gurney A. Alycia Rossetti, MD;  Location: [facility];  Service: Gynecology;  Laterality: N/A;  . Co2 laser application N/A 1/61/0960    Procedure: LASER APPLICATION OF THE VULVA;  Surgeon: Janie Morning, MD;  Location: [facility];  Service: Gynecology;  Laterality: N/A;  . Video bronchoscopy Bilateral 09/23/2013    Procedure: VIDEO BRONCHOSCOPY WITHOUT FLUORO;  Surgeon: Tanda Rockers, MD;  Location: [facility];  Service: Cardiopulmonary;  Laterality: Bilateral;    REVIEW OF SYSTEMS:  Constitutional: positive for fatigue Eyes: negative Ears, nose, mouth, throat, and face: negative Respiratory: positive for cough, dyspnea on exertion and sputum Cardiovascular: negative Gastrointestinal:  negative Genitourinary:negative Integument/breast: negative Hematologic/lymphatic: negative Musculoskeletal:positive for muscle weakness Neurological: negative Behavioral/Psych: negative Endocrine: negative Allergic/Immunologic: negative   PHYSICAL EXAMINATION: General appearance: alert, cooperative, fatigued and no distress Head: Normocephalic, without obvious abnormality, atraumatic Neck: no adenopathy, no JVD, supple, symmetrical, trachea midline and thyroid not enlarged, symmetric, no tenderness/mass/nodules Lymph nodes: Cervical, supraclavicular, and axillary nodes normal. Resp: wheezes bilaterally Back: symmetric, no curvature. ROM normal. No CVA tenderness. Cardio: regular rate and rhythm, S1, S2 normal, no murmur, click, rub or gallop GI: soft, non-tender; bowel sounds normal; no masses,  no organomegaly Extremities: extremities normal, atraumatic, no cyanosis or edema Neurologic: Alert and oriented X 3,  normal strength and tone. Normal symmetric reflexes. Normal coordination and gait  ECOG PERFORMANCE STATUS: 1 - Symptomatic but completely ambulatory  Blood pressure 151/80, pulse 83, temperature 97.9 F (36.6 C), temperature source Oral, resp. rate 18, height 5\' 6"  (1.676 m), weight 180 lb 12.8 oz (82.01 kg), SpO2 92.00%.  LABORATORY DATA: Lab Results  Component Value Date   WBC 4.0 01/03/2014   HGB 12.0 01/03/2014   HCT 35.1 01/03/2014   MCV 98.8 01/03/2014   PLT 182 01/03/2014      Chemistry      Component Value Date/Time   NA 141 01/03/2014 1016   NA 139 01/01/2014 0614   K 3.8 01/03/2014 1016   K 3.7 01/01/2014 0614   CL 105 01/01/2014 0614   CO2 27 01/03/2014 1016   CO2 27 01/01/2014 0614   BUN 6.6* 01/03/2014 1016   BUN 7 01/01/2014 0614   CREATININE 0.8 01/03/2014 1016   CREATININE 0.69 01/01/2014 0614      Component Value Date/Time   CALCIUM 8.7 01/03/2014 1016   CALCIUM 8.0* 01/01/2014 0614   ALKPHOS 113 01/03/2014 1016   ALKPHOS 100 01/01/2014 0614   AST 13 01/03/2014 1016   AST 12  01/01/2014 0614   ALT 9 01/03/2014 1016   ALT 7 01/01/2014 0614   BILITOT 0.26 01/03/2014 1016   BILITOT 0.2* 01/01/2014 0614       RADIOGRAPHIC STUDIES: Dg Chest 2 View  12/31/2013   CLINICAL DATA:  Cough, nausea and vomiting, intermittent fever and weakness.  EXAM: CHEST  2 VIEW  COMPARISON:  Chest x-ray 11/22/2013.  FINDINGS: Linear opacities are noted in the region of the right middle lobe, presumably post infectious or inflammatory scarring. No acute consolidative airspace disease is noted on today's examination. No pleural effusions. No evidence of pulmonary edema. Heart size is normal. Mediastinal contours are unremarkable. Atherosclerosis in the thoracic aorta. Orthopedic fixation hardware in the lower cervical spine.  IMPRESSION: 1. No radiographic evidence of acute cardiopulmonary disease. 2. Mild post infectious or inflammatory scarring in the right middle lobe. 3. Atherosclerosis.   Electronically Signed   By: Vinnie Langton M.D.   On: 12/31/2013 17:04   Ct Chest W Contrast  01/03/2014   CLINICAL DATA:  Restaging non-small cell lung cancer. Chemotherapy and radiation therapy completed.  EXAM: CT CHEST WITH CONTRAST  TECHNIQUE: Multidetector CT imaging of the chest was performed during intravenous contrast administration.  CONTRAST:  7mL OMNIPAQUE IOHEXOL 300 MG/ML  SOLN  COMPARISON:  12/31/2013 and 09/04/2013.  FINDINGS: Mediastinal lymph nodes are subcentimeter in short axis size. No left hilar or axillary adenopathy. Atherosclerotic calcification of the arterial vasculature, including three-vessel involvement of the coronary arteries. Heart size normal. Small amount of pericardial fluid may be physiologic.  Debris is seen in the right middle and right lower lobe bronchi with surrounding low-attenuation soft tissue lesion in the right lower lobe, measuring approximately 2.5 x 2.9 cm (image 37), previously 3.9 x 4.3 cm, when remeasured. Centrilobular emphysema. Scattered pulmonary nodular densities  measure 4 mm or less in size and are unchanged from 09/04/2013. Mild postobstructive opacity and/or radiation changes in the right middle lobe and right lower lobe. Mild volume loss in the left lower lobe. No pleural fluid. Airway is otherwise unremarkable.  Incidental imaging of the upper abdomen shows no acute findings. Mild intrahepatic biliary duct dilatation is unchanged. Adrenal glands are unremarkable. No worrisome lytic or sclerotic lesions. Degenerative changes are seen in the spine.  IMPRESSION: 1. Right  lower lobe mass is smaller when compared with 09/04/2013. Associated internal debris and obstruction involving the right middle and right lower lobe bronchi. 2. Scattered tiny pulmonary nodular densities are unchanged. 3. Extensive 3 vessel coronary artery calcification.   Electronically Signed   By: Lorin Picket M.D.   On: 01/03/2014 11:54   Ct Chest W Contrast  12/31/2013   CLINICAL DATA:  Bilateral chest pain with nausea and vomiting, history of lung cancer  EXAM: CT CHEST, ABDOMEN, AND PELVIS WITH CONTRAST  TECHNIQUE: Multidetector CT imaging of the chest, abdomen and pelvis was performed following the standard protocol during bolus administration of intravenous contrast.  CONTRAST:  130mL OMNIPAQUE IOHEXOL 300 MG/ML  SOLN  COMPARISON:  10/07/2013 PET scan  FINDINGS: CT CHEST FINDINGS  There is a right infrahilar lung mass measuring 26 x 29 mm, previously measuring 48 x 41 mm. This mass appears to involve right lower lobe bronchus with soft tissue within the bronchus, a similar appearance to prior study. There is aortic and coronary arterial calcification.  There are numerous small mediastinal lymph nodes. These are seen in the pretracheal and precarinal areas and are not significantly changed when compared to the prior study. The thoracic aorta shows no evidence of dissection or dilatation. Main pulmonary arteries appear grossly normal although this study is not a CT pulmonary arteriogram. There  is no other significant mediastinal or hilar adenopathy. There is no pericardial or pleural effusion.  The left lung is clear. Other than the mass described above, the right lung is clear except for a 4 mm pulmonary nodule series 3, image 33, located in the right middle lobe. There is discoid atelectasis in the right lower lobe.  CT ABDOMEN AND PELVIS FINDINGS  There is mild biliary dilatation in the liver. Common bile duct is mildly prominent in the region of the pancreas, measuring 10 mm. No pancreatic head mass is identified. Gallbladder is normal. Spleen is normal. Pancreas is normal. Adrenal glands are normal. Left kidney shows a small extrarenal pelvis but is otherwise normal. Right kidney demonstrates a 3 mm nonobstructing midpole stone. The distal infrarenal abdominal aorta is mildly dilated just above the bifurcation, to a diameter of 25 x 26 mm. There is extensive aortoiliac calcification.  There is a nonobstructive bowel gas pattern. There are air-fluid levels in the sigmoid colon, this suggests a diarrhea type illness. There is no ascites. Bladder is normal. Reproductive organs are not identified. Appendix is normal. There is postoperative change in lumbar spine. There are no acute musculoskeletal findings.  IMPRESSION: 1. Known right lung mass, smaller when compared to prior study. Tiny right middle lobe pulmonary nodule, indeterminate. No acute abnormalities in the thorax. 2. Mild biliary dilatation. Significance uncertain. Correlate with appropriate laboratory values. 3. Stable mild distal aortic dilatation. 4. Air-fluid levels in the distal colon. Nonobstructive gas pattern. Findings suggest a diarrhea type illness.   Electronically Signed   By: Skipper Cliche M.D.   On: 12/31/2013 21:02   ASSESSMENT AND PLAN: This is a very pleasant 68 years old white female with stage IIIa non-small cell lung cancer completed a course of concurrent chemoradiation with weekly carboplatin and paclitaxel and  tolerating her treatment fairly well. Her recent scan showed significant improvement in her disease but the patient continues to have residual in the right lung.  I discussed the scan results and showed the images to the patient today. I gave her the option of continuous observation versus consideration of consolidation chemotherapy with carboplatin and  paclitaxel. The patient declined to proceed with any further consultation chemotherapy at this point and she will continue on observation. I would see her back for follow up visit in 3 months with repeat CT scan of the chest. She was advised to call immediately if she has any concerning symptoms in the interval. For the acute bronchitis, I will start the patient on Z-Pak and she was advised to follow up with her primary care physician and she has no improvement in her condition. The patient voices understanding of current disease status and treatment options and is in agreement with the current care plan.  All questions were answered. The patient knows to call the clinic with any problems, questions or concerns. We can certainly see the patient much sooner if necessary. I spent 15 minutes of face-to-face counseling with the patient today out of the total visit time 25 minutes.  Disclaimer: This note was dictated with voice recognition software. Similar sounding words can inadvertently be transcribed and may not be corrected upon review.

## 2014-01-26 ENCOUNTER — Telehealth: Payer: Self-pay | Admitting: Gynecologic Oncology

## 2014-01-26 ENCOUNTER — Telehealth: Payer: Self-pay | Admitting: *Deleted

## 2014-01-26 NOTE — Telephone Encounter (Signed)
Called to follow up on patient stating she did not need to see Dr. Skeet Latch any more when reminded of her appt for tomorrow.  "I have lung cancer so I don't need to see her anymore."  Instructed to call for any concerns or issues in the future.

## 2014-01-26 NOTE — Telephone Encounter (Signed)
Called patient to remind her of appointment with Dr. Janie Morning on 01/27/14. Pt stated she wanted to cancel her appointment and did not want to reschedule. I asked Ms. Kloeppel did she plan to call back and reschedule at a later date. Ms. Madia stated she did not want to come back again. I told Ms.Witz that [employer] would be contacting her to verify her decision not to return to [place].

## 2014-01-27 ENCOUNTER — Ambulatory Visit: Payer: Medicare Other | Admitting: Gynecologic Oncology

## 2014-03-07 ENCOUNTER — Telehealth: Payer: Self-pay | Admitting: *Deleted

## 2014-03-07 NOTE — Telephone Encounter (Signed)
Message copied by Britt Bottom on Mon Mar 07, 2014  9:52 AM ------      Message from: Curt Bears      Created: Sat Mar 05, 2014 12:45 PM      Regarding: RE: sooner f/u?       We can move the scan sooner by the end of March and follow up early April. She should consult with PCP for adjusting pain medicine.      ----- Message -----         From: Anders Grant, RN         Sent: 03/04/2014   1:46 PM           To: Curt Bears, MD      Subject: sooner f/u?                                              States for the past 3-4 weeks she has had increased pain on her right chest.  She has pain medication that her PCP had prescribed for pain but it is not helping (she has pain in her back and ankles as well).  Pt has increased SOB and coughing, coughing is relieves by tessalon pearles.  Pt is requesting sooner f/u then 4/27 CT and 5/4 f/u.  She states she has already been to her PCP and that "they did not do anything".       ------

## 2014-03-07 NOTE — Telephone Encounter (Signed)
Attempted to call pt, unable to leave msg, onc tx schedule filled out to move appts per pt request.  Called pt's emergency contact [name], he verbalized understanding.  SLJ

## 2014-03-09 ENCOUNTER — Telehealth: Payer: Self-pay | Admitting: Internal Medicine

## 2014-03-09 NOTE — Telephone Encounter (Signed)
, °

## 2014-03-17 ENCOUNTER — Telehealth: Payer: Self-pay | Admitting: Internal Medicine

## 2014-03-17 NOTE — Telephone Encounter (Signed)
returned pt call and no vm available....pt just needed confirmation of appts

## 2014-03-21 ENCOUNTER — Telehealth: Payer: Self-pay | Admitting: Medical Oncology

## 2014-03-21 NOTE — Telephone Encounter (Signed)
Pt confused about appointments , I sent [facility] request to move up CT scan.

## 2014-03-22 ENCOUNTER — Telehealth: Payer: Self-pay | Admitting: Internal Medicine

## 2014-03-22 NOTE — Telephone Encounter (Signed)
S.W. PT AND ADVISED ON MARCH AND APRIL APPT....PT OK AND AWARE

## 2014-03-25 ENCOUNTER — Ambulatory Visit (HOSPITAL_COMMUNITY)
Admission: RE | Admit: 2014-03-25 | Discharge: 2014-03-25 | Disposition: A | Discharge status: Home / Self Care | Payer: Medicare Other | Source: Ambulatory Visit | Attending: Internal Medicine | Admitting: Internal Medicine

## 2014-03-25 ENCOUNTER — Encounter (HOSPITAL_COMMUNITY): Payer: Self-pay

## 2014-03-25 ENCOUNTER — Other Ambulatory Visit (HOSPITAL_BASED_OUTPATIENT_CLINIC_OR_DEPARTMENT_OTHER): Payer: Medicare Other

## 2014-03-25 DIAGNOSIS — C3430 Malignant neoplasm of lower lobe, unspecified bronchus or lung: Secondary | ICD-10-CM

## 2014-03-25 DIAGNOSIS — I2584 Coronary atherosclerosis due to calcified coronary lesion: Secondary | ICD-10-CM | POA: Insufficient documentation

## 2014-03-25 DIAGNOSIS — J90 Pleural effusion, not elsewhere classified: Secondary | ICD-10-CM | POA: Insufficient documentation

## 2014-03-25 DIAGNOSIS — Z9221 Personal history of antineoplastic chemotherapy: Secondary | ICD-10-CM | POA: Insufficient documentation

## 2014-03-25 DIAGNOSIS — C3490 Malignant neoplasm of unspecified part of unspecified bronchus or lung: Secondary | ICD-10-CM | POA: Insufficient documentation

## 2014-03-25 DIAGNOSIS — J438 Other emphysema: Secondary | ICD-10-CM | POA: Insufficient documentation

## 2014-03-25 DIAGNOSIS — M479 Spondylosis, unspecified: Secondary | ICD-10-CM | POA: Insufficient documentation

## 2014-03-25 DIAGNOSIS — I319 Disease of pericardium, unspecified: Secondary | ICD-10-CM | POA: Insufficient documentation

## 2014-03-25 DIAGNOSIS — I2510 Atherosclerotic heart disease of native coronary artery without angina pectoris: Secondary | ICD-10-CM | POA: Insufficient documentation

## 2014-03-25 LAB — COMPREHENSIVE METABOLIC PANEL (CC13)
ALBUMIN: 3.5 g/dL (ref 3.5–5.0)
ALT: 7 U/L (ref 0–55)
ANION GAP: 11 meq/L (ref 3–11)
AST: 14 U/L (ref 5–34)
Alkaline Phosphatase: 105 U/L (ref 40–150)
BUN: 4.9 mg/dL — ABNORMAL LOW (ref 7.0–26.0)
CALCIUM: 9 mg/dL (ref 8.4–10.4)
CHLORIDE: 104 meq/L (ref 98–109)
CO2: 27 meq/L (ref 22–29)
Creatinine: 0.7 mg/dL (ref 0.6–1.1)
GLUCOSE: 102 mg/dL (ref 70–140)
POTASSIUM: 3.4 meq/L — AB (ref 3.5–5.1)
SODIUM: 142 meq/L (ref 136–145)
TOTAL PROTEIN: 6.6 g/dL (ref 6.4–8.3)
Total Bilirubin: 0.27 mg/dL (ref 0.20–1.20)

## 2014-03-25 LAB — CBC WITH DIFFERENTIAL/PLATELET
BASO%: 0.8 % (ref 0.0–2.0)
Basophils Absolute: 0 10*3/uL (ref 0.0–0.1)
EOS ABS: 0.1 10*3/uL (ref 0.0–0.5)
EOS%: 3.6 % (ref 0.0–7.0)
HCT: 40.3 % (ref 34.8–46.6)
HGB: 13.2 g/dL (ref 11.6–15.9)
LYMPH#: 0.8 10*3/uL — AB (ref 0.9–3.3)
LYMPH%: 19.2 % (ref 14.0–49.7)
MCH: 31.2 pg (ref 25.1–34.0)
MCHC: 32.8 g/dL (ref 31.5–36.0)
MCV: 94.9 fL (ref 79.5–101.0)
MONO#: 0.4 10*3/uL (ref 0.1–0.9)
MONO%: 9.4 % (ref 0.0–14.0)
NEUT%: 67 % (ref 38.4–76.8)
NEUTROS ABS: 2.6 10*3/uL (ref 1.5–6.5)
Platelets: 160 10*3/uL (ref 145–400)
RBC: 4.25 10*6/uL (ref 3.70–5.45)
RDW: 12.1 % (ref 11.2–14.5)
WBC: 3.9 10*3/uL (ref 3.9–10.3)

## 2014-03-25 MED ORDER — IOHEXOL 300 MG/ML  SOLN
80.0000 mL | Freq: Once | INTRAMUSCULAR | Status: AC | PRN
  Administered 2014-03-25: 80 mL via INTRAVENOUS

## 2014-03-31 ENCOUNTER — Encounter: Payer: Self-pay | Admitting: Internal Medicine

## 2014-03-31 ENCOUNTER — Telehealth: Payer: Self-pay | Admitting: Internal Medicine

## 2014-03-31 ENCOUNTER — Ambulatory Visit (HOSPITAL_BASED_OUTPATIENT_CLINIC_OR_DEPARTMENT_OTHER): Payer: Medicare Other | Admitting: Internal Medicine

## 2014-03-31 VITALS — BP 92/53 | HR 76 | Temp 97.9°F | Resp 18 | Ht 66.0 in | Wt 176.7 lb

## 2014-03-31 DIAGNOSIS — R0609 Other forms of dyspnea: Secondary | ICD-10-CM

## 2014-03-31 DIAGNOSIS — F17200 Nicotine dependence, unspecified, uncomplicated: Secondary | ICD-10-CM

## 2014-03-31 DIAGNOSIS — C3490 Malignant neoplasm of unspecified part of unspecified bronchus or lung: Secondary | ICD-10-CM

## 2014-03-31 DIAGNOSIS — C3430 Malignant neoplasm of lower lobe, unspecified bronchus or lung: Secondary | ICD-10-CM

## 2014-03-31 DIAGNOSIS — R0989 Other specified symptoms and signs involving the circulatory and respiratory systems: Secondary | ICD-10-CM

## 2014-03-31 NOTE — Patient Instructions (Signed)
Smoking Cessation, Tips for Success If you are ready to quit smoking, congratulations! You have chosen to help yourself be healthier. Cigarettes bring nicotine, tar, carbon monoxide, and other irritants into your body. Your lungs, heart, and blood vessels will be able to work better without these poisons. There are many different ways to quit smoking. Nicotine gum, nicotine patches, a nicotine inhaler, or nicotine nasal spray can help with physical craving. Hypnosis, support groups, and medicines help break the habit of smoking. WHAT THINGS CAN I DO TO MAKE QUITTING EASIER?  Here are some tips to help you quit for good:  Pick a date when you will quit smoking completely. Tell all of your friends and family about your plan to quit on that date.  Do not try to slowly cut down on the number of cigarettes you are smoking. Pick a quit date and quit smoking completely starting on that day.  Throw away all cigarettes.   Clean and remove all ashtrays from your home, work, and car.   On a card, write down your reasons for quitting. Carry the card with you and read it when you get the urge to smoke.   Cleanse your body of nicotine. Drink enough water and fluids to keep your urine clear or pale yellow. Do this after quitting to flush the nicotine from your body.   Learn to predict your moods. Do not let a bad situation be your excuse to have a cigarette. Some situations in your life might tempt you into wanting a cigarette.   Never have "just one" cigarette. It leads to wanting another and another. Remind yourself of your decision to quit.   Change habits associated with smoking. If you smoked while driving or when feeling stressed, try other activities to replace smoking. Stand up when drinking your coffee. Brush your teeth after eating. Sit in a different chair when you read the paper. Avoid alcohol while trying to quit, and try to drink fewer caffeinated beverages. Alcohol and caffeine may urge  you to smoke.   Avoid foods and drinks that can trigger a desire to smoke, such as sugary or spicy foods and alcohol.   Ask people who smoke not to smoke around you.   Have something planned to do right after eating or having a cup of coffee. For example, plan to take a walk or exercise.   Try a relaxation exercise to calm you down and decrease your stress. Remember, you may be tense and nervous for the first 2 weeks after you quit, but this will pass.   Find new activities to keep your hands busy. Play with a pen, coin, or rubber band. Doodle or draw things on paper.   Brush your teeth right after eating. This will help cut down on the craving for the taste of tobacco after meals. You can also try mouthwash.   Use oral substitutes in place of cigarettes. Try using lemon drops, carrots, cinnamon sticks, or chewing gum. Keep them handy so they are available when you have the urge to smoke.   When you have the urge to smoke, try deep breathing.   Designate your home as a nonsmoking area.   If you are a heavy smoker, ask your health care provider about a prescription for nicotine chewing gum. It can ease your withdrawal from nicotine.   Reward yourself. Set aside the cigarette money you save and buy yourself something nice.   Look for support from others. Join a support group or   smoking cessation program. Ask someone at home or at work to help you with your plan to quit smoking.   Always ask yourself, "Do I need this cigarette or is this just a reflex?" Tell yourself, "Today, I choose not to smoke," or "I do not want to smoke." You are reminding yourself of your decision to quit.  Do not replace cigarette smoking with electronic cigarettes (commonly called e-cigarettes). The safety of e-cigarettes is unknown, and some may contain harmful chemicals.  If you relapse, do not give up! Plan ahead and think about what you will do the next time you get the urge to smoke.  HOW WILL  I FEEL WHEN I QUIT SMOKING? You may have symptoms of withdrawal because your body is used to nicotine (the addictive substance in cigarettes). You may crave cigarettes, be irritable, feel very hungry, cough often, get headaches, or have difficulty concentrating. The withdrawal symptoms are only temporary. They are strongest when you first quit but will go away within 10 14 days. When withdrawal symptoms occur, stay in control. Think about your reasons for quitting. Remind yourself that these are signs that your body is healing and getting used to being without cigarettes. Remember that withdrawal symptoms are easier to treat than the major diseases that smoking can cause.  Even after the withdrawal is over, expect periodic urges to smoke. However, these cravings are generally short lived and will go away whether you smoke or not. Do not smoke!  WHAT RESOURCES ARE AVAILABLE TO HELP ME QUIT SMOKING? Your health care provider can direct you to community resources or hospitals for support, which may include:  Group support.  Education.  Hypnosis.  Therapy. Document Released: 09/13/2004 Document Revised: 10/06/2013 Document Reviewed: 06/03/2013 [employer] Patient Information 2014 [employer].  

## 2014-03-31 NOTE — Telephone Encounter (Signed)
S/w the pt and she is aware of her appts on 04/06/2014 and she will pick up the rest of her appt schedules at that time.

## 2014-03-31 NOTE — Progress Notes (Signed)
[facility] Telephone:([phone]) [phone]   Fax:(336) [place], [place]  [place] [address]  DIAGNOSIS: Locally advanced, likely stage IIIA (T2a., N2, M0) non-small cell lung cancer, squamous cell carcinoma diagnosed in September of 2014.   PRIOR THERAPY: Concurrent chemoradiation with weekly carboplatin for AUC of 2 and paclitaxel 45 mg/M2, status post 7 cycles, last cycle was given 11/29/2013 with partial response. First dose was given on 10/18/2013.    CURRENT THERAPY: Systemic chemotherapy with carboplatin for AUC of 5 and paclitaxel 175 mg/M2 every 3 weeks with Neulasta support. First cycle on 04/06/2014.   CHEMOTHERAPY INTENT: Curative/control  CURRENT # OF CHEMOTHERAPY CYCLES: 1 CURRENT ANTIEMETICS: Zofran, Compazine and dexamethasone  CURRENT SMOKING STATUS: Current smoker and she was strongly advised to quit smoking and also to smoke cessation program  ORAL CHEMOTHERAPY AND CONSENT: None  CURRENT BISPHOSPHONATES USE: None  PAIN MANAGEMENT: 0/10  NARCOTICS INDUCED CONSTIPATION: None  LIVING WILL AND CODE STATUS: Full code  INTERVAL HISTORY: [name] 68 y.o. female returns to the clinic today for follow up visit. The patient s feeling fine today with no specific complaints except for baseline shortness of breath increased with exertion and she is currently on home oxygen.. She denied having any significant chest pain but continues to have shortness of breath with exertion with no hemoptysis. She denied having any significant weight loss or night sweats. The patient unfortunately continues to smoke few cigarettes every day but she is trying to quit but not successful and she is not interested in the smoke cessation programs. She denied having any significant fever or chills, no nausea or vomiting. She has repeat CT scan of the chest performed recently and she is here for evaluation and discussion of her scan  results.  MEDICAL HISTORY: Past Medical History  Diagnosis Date  . H/O: stroke 2012, 2000,     X3  . Hypertension   . Anxiety   . PTSD (post-traumatic stress disorder)   . Depression   . SVD (spontaneous vaginal delivery)     x 1  . Smoker   . Osteoarthritis     knees, hips hands,   . Shortness of breath   . Pain     SEVERE PAIN BACK, RIGHT HIP AND KNEES--HARRINGTON RODS AND CERVICAL PLATES--USES WALKER OR CANE WHEN AMBULATING - GOES TO A PAIN CLINIC-STATES SHE NEEDS RT HIP AND BOTH KNEES REPLACED. PT STATES SHE WAS TOLD THE CEMENT AROUND THE HARRINGTON RODS IS CRACKED.  [name] Hot flashes, menopausal     SEVERE  . Family history of anesthesia complication     equipment failure caused problem  . Complication of anesthesia 03/02/13    severe headache,vertigo postop  . COPD (chronic obstructive pulmonary disease)     stable  . 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 Lung cancer   . Status post radiation therapy within four to twelve weeks 10/18/13-12/06/13    lung ca  66Gy    ALLERGIES:  is allergic to prednisone; nsaids; aspirin; and dulera.  MEDICATIONS:  Current Outpatient Prescriptions  Medication Sig Dispense Refill  . Aclidinium Bromide (TUDORZA PRESSAIR) 400 MCG/ACT AEPB Inhale 1 puff into the lungs 2 (two) times daily.      [name] albuterol (PROVENTIL) (2.5 MG/3ML) 0.083% nebulizer solution Take 2.5 mg by nebulization as needed for wheezing.      [name] albuterol (VENTOLIN HFA) 108 (90 BASE) MCG/ACT  inhaler Inhale 2 puffs into the lungs every 6 (six) hours as needed for wheezing or shortness of breath.  1 Inhaler  11  . ALPRAZolam (XANAX) 1 MG tablet Take 1 tablet (1 mg total) by mouth every 6 (six) hours as needed for anxiety.  30 tablet  0  . atenolol (TENORMIN) 50 MG tablet Take 1 tablet (50 mg total) by mouth daily.  30 tablet  0  . chlorzoxazone (PARAFON) 500 MG tablet Take 500 mg by mouth 4 (four) times daily as needed for muscle spasms.      [name] dextromethorphan-guaiFENesin (MUCINEX  DM) 30-600 MG per 12 hr tablet Take 1 tablet by mouth every 12 (twelve) hours.      [name] esomeprazole (NEXIUM) 40 MG capsule Take 40 mg by mouth daily.       . fentaNYL (DURAGESIC - DOSED MCG/HR) 25 MCG/HR patch Place 25 mcg onto the skin every 3 (three) days.      . nicotine (NICODERM CQ - DOSED IN MG/24 HOURS) 14 mg/24hr patch Place 1 patch onto the skin daily as needed.      . ondansetron (ZOFRAN ODT) 4 MG disintegrating tablet Take 1 tablet (4 mg total) by mouth every 8 (eight) hours as needed for nausea or vomiting.  20 tablet  0  . oxyCODONE-acetaminophen (PERCOCET) 10-325 MG per tablet Take 1 tablet by mouth every 6 (six) hours as needed for pain.  60 tablet  0  . sertraline (ZOLOFT) 100 MG tablet Take 200 mg by mouth daily.       . sucralfate (CARAFATE) 1 G tablet Take 1 tablet (1 g total) by mouth 4 (four) times daily.  120 tablet  2  . trazodone (DESYREL) 300 MG tablet Take 300 mg by mouth at bedtime.       No current facility-administered medications for this visit.    SURGICAL HISTORY:  Past Surgical History  Procedure Laterality Date  . Carpal tunnel release      BILATERAL  . Nasal sinus surgery    . Lumbar fusion      L4-S1  . Cervical fusion    . Hernia repair    . Salpingoophorectomy Right   . Vaginal hysterectomy      TAH w/ ovary removal  . Transthoracic echocardiogram  12-03-2011    LVSF NORMAL/ EF 60-65%  . Vulvectomy N/A 03/02/2013    Procedure: WIDE LOCAL EXCISION VULVAR;  Surgeon: Imagene Gurney A. Alycia Rossetti, MD;  Location: [facility];  Service: Gynecology;  Laterality: N/A;  . Co2 laser application N/A 08/09/5725    Procedure: LASER APPLICATION OF THE VULVA;  Surgeon: Janie Morning, MD;  Location: [facility];  Service: Gynecology;  Laterality: N/A;  . Video bronchoscopy Bilateral 09/23/2013    Procedure: VIDEO BRONCHOSCOPY WITHOUT FLUORO;  Surgeon: Tanda Rockers, MD;  Location: [facility];  Service: Cardiopulmonary;  Laterality: Bilateral;    REVIEW OF  SYSTEMS:  Constitutional: positive for fatigue Eyes: negative Ears, nose, mouth, throat, and face: negative Respiratory: positive for cough, dyspnea on exertion and sputum Cardiovascular: negative Gastrointestinal: negative Genitourinary:negative Integument/breast: negative Hematologic/lymphatic: negative Musculoskeletal:positive for muscle weakness Neurological: negative Behavioral/Psych: negative Endocrine: negative Allergic/Immunologic: negative   PHYSICAL EXAMINATION: General appearance: alert, cooperative, fatigued and no distress Head: Normocephalic, without obvious abnormality, atraumatic Neck: no adenopathy, no JVD, supple, symmetrical, trachea midline and thyroid not enlarged, symmetric, no tenderness/mass/nodules Lymph nodes: Cervical, supraclavicular, and axillary nodes normal. Resp: wheezes bilaterally Back: symmetric, no curvature. ROM normal. No CVA tenderness. Cardio:  regular rate and rhythm, S1, S2 normal, no murmur, click, rub or gallop GI: soft, non-tender; bowel sounds normal; no masses,  no organomegaly Extremities: extremities normal, atraumatic, no cyanosis or edema Neurologic: Alert and oriented X 3, normal strength and tone. Normal symmetric reflexes. Normal coordination and gait  ECOG PERFORMANCE STATUS: 2 - Symptomatic, <50% confined to bed  Blood pressure 92/53, pulse 76, temperature 97.9 F (36.6 C), temperature source Oral, resp. rate 18, height 5\' 6"  (1.676 m), weight 176 lb 11.2 oz (80.151 kg), SpO2 92.00%.  LABORATORY DATA: Lab Results  Component Value Date   WBC 3.9 03/25/2014   HGB 13.2 03/25/2014   HCT 40.3 03/25/2014   MCV 94.9 03/25/2014   PLT 160 03/25/2014      Chemistry      Component Value Date/Time   NA 142 03/25/2014 1133   NA 139 01/01/2014 0614   K 3.4* 03/25/2014 1133   K 3.7 01/01/2014 0614   CL 105 01/01/2014 0614   CO2 27 03/25/2014 1133   CO2 27 01/01/2014 0614   BUN 4.9* 03/25/2014 1133   BUN 7 01/01/2014 0614   CREATININE 0.7  03/25/2014 1133   CREATININE 0.69 01/01/2014 0614      Component Value Date/Time   CALCIUM 9.0 03/25/2014 1133   CALCIUM 8.0* 01/01/2014 0614   ALKPHOS 105 03/25/2014 1133   ALKPHOS 100 01/01/2014 0614   AST 14 03/25/2014 1133   AST 12 01/01/2014 0614   ALT 7 03/25/2014 1133   ALT 7 01/01/2014 0614   BILITOT 0.27 03/25/2014 1133   BILITOT 0.2* 01/01/2014 0614       RADIOGRAPHIC STUDIES: Ct Chest W Contrast  03/25/2014   CLINICAL DATA:  Followup lung cancer.  Chemotherapy complete.  EXAM: CT CHEST WITH CONTRAST  TECHNIQUE: Multidetector CT imaging of the chest was performed during intravenous contrast administration.  CONTRAST:  59mL OMNIPAQUE IOHEXOL 300 MG/ML  SOLN  COMPARISON:  CT CHEST W/CM dated 01/03/2014;   CT CHEST W/CM dated 12/31/2013  FINDINGS: Mediastinal lymph nodes measure up to 11 mm in the low left paratracheal station, stable. Soft tissue density in the right infrahilar region has increased in prominence, however. Discrete measurement is difficult, due to its irregular appearance. Approximate measurement is 2.4 x 3.9 cm (previously 2.5 x 2.9 cm). No left hilar or axillary adenopathy. Atherosclerotic calcification of the arterial vasculature, including three-vessel involvement of the coronary arteries. Heart size within normal limits. Small pericardial effusion is new.  Small right pleural effusion, new. Centrilobular and paraseptal emphysema, mild. Collapse/ consolidation in the right lower lobe. The superior segmental right lower lobe bronchus is narrowed but patent. Right lower lobe basal segmental bronchi are obstructed. Right middle lobe bronchus is also extremely narrowed (image 30) with new volume loss in the right middle lobe. Subpleural densities in the medial aspect of the right upper lobe (images 26 and 29) are new. Additional scattered subpleural nodules are grossly stable and measure 4 mm or less in size in the right upper lobe. Probable small subpleural lymph node along the left major  fissure. No left pleural fluid. Airway is otherwise grossly unremarkable.  Incidental imaging of the upper abdomen shows the visualized portions of the liver, gallbladder, adrenal glands, kidneys, spleen, pancreas, stomach and bowel to be grossly unremarkable. No upper abdominal adenopathy. No worrisome lytic or sclerotic lesions. Extensive degenerative changes in the spine. Pectus deformity.   IMPRESSION: 1. Enlarging right infrahilar mass with collapse/consolidation in the right lower lobe and volume loss  in the right middle lobe. Together with a new small right pleural effusion and new small pericardial effusion, findings are most indicative of progression of the patient's lung cancer. 2. New subpleural densities in the medial aspect of the right upper lobe are nonspecific but likely infectious or inflammatory in etiology. 3. Three-vessel coronary artery calcification.   Electronically Signed   By: Lorin Picket M.D.   On: 03/25/2014 14:01  ASSESSMENT AND PLAN: This is a very pleasant 68 years old white female with stage IIIA non-small cell lung cancer completed a course of concurrent chemoradiation with weekly carboplatin and paclitaxel and tolerating her treatment fairly well. Her recent scan showed enlarging right infrahilar mass was collapse/consolidation in the right lower lobe and volume loss in the right middle lobe concerning for disease progression.  I discussed the scan results and showed the images to the patient today. I recommended for the patient to consider systemic chemotherapy with carboplatin for AUC of 5 and paclitaxel 175 mg/M2 every 3 weeks with Neulasta support. She was also given him the option of continuous observation or palliative care. The patient is interested in proceeding with systemic chemotherapy. I discussed with her the adverse effect of this treatment including but not limited to alopecia, myelosuppression, nausea and vomiting, peripheral neuropathy, liver or renal  dysfunction. She is expected to start the first cycle of this treatment on 04/06/2014 and the patient may be able to receive her Neulasta injection at [facility] closer to home. She would come back for follow up visit in 4 weeks with the start of cycle #2. She was advised to call immediately if she has any concerning symptoms in the interval.  The patient voices understanding of current disease status and treatment options and is in agreement with the current care plan.  All questions were answered. The patient knows to call the clinic with any problems, questions or concerns. We can certainly see the patient much sooner if necessary. I spent 15 minutes of face-to-face counseling with the patient today out of the total visit time 25 minutes.  Disclaimer: This note was dictated with voice recognition software. Similar sounding words can inadvertently be transcribed and may not be corrected upon review.

## 2014-04-01 ENCOUNTER — Telehealth: Payer: Self-pay | Admitting: *Deleted

## 2014-04-01 NOTE — Telephone Encounter (Signed)
Per staff message and 4/2 POF I have scheduled 4/8 and 4/29 appt

## 2014-04-02 ENCOUNTER — Encounter: Payer: Self-pay | Admitting: Internal Medicine

## 2014-04-03 ENCOUNTER — Telehealth: Payer: Self-pay | Admitting: Internal Medicine

## 2014-04-03 NOTE — Telephone Encounter (Signed)
added additional appts. pt to get new schedule 4/8.

## 2014-04-04 ENCOUNTER — Other Ambulatory Visit: Payer: Self-pay | Admitting: *Deleted

## 2014-04-04 ENCOUNTER — Telehealth: Payer: Self-pay | Admitting: Internal Medicine

## 2014-04-04 NOTE — Telephone Encounter (Signed)
Sent michelle an email regarding this pt's request for her appts on 04/07/2104 and 04/27/2014.

## 2014-04-04 NOTE — Telephone Encounter (Signed)
[name] from [facility]. Left the mrn number of this pt due to dr Julien Nordmann and the pt is requesting to have her weekly labs and injection appts done at [facility].

## 2014-04-05 ENCOUNTER — Telehealth: Payer: Self-pay | Admitting: Internal Medicine

## 2014-04-05 NOTE — Telephone Encounter (Signed)
Cancelled all of the pt's lab and injection appts that are supposed to be done at [facility].

## 2014-04-06 ENCOUNTER — Other Ambulatory Visit: Payer: Self-pay

## 2014-04-06 ENCOUNTER — Ambulatory Visit (HOSPITAL_BASED_OUTPATIENT_CLINIC_OR_DEPARTMENT_OTHER): Payer: Medicare Other

## 2014-04-06 ENCOUNTER — Other Ambulatory Visit (HOSPITAL_BASED_OUTPATIENT_CLINIC_OR_DEPARTMENT_OTHER): Payer: Medicare Other

## 2014-04-06 VITALS — BP 103/83 | HR 76 | Temp 98.7°F | Resp 18

## 2014-04-06 DIAGNOSIS — Z5111 Encounter for antineoplastic chemotherapy: Secondary | ICD-10-CM

## 2014-04-06 DIAGNOSIS — C3490 Malignant neoplasm of unspecified part of unspecified bronchus or lung: Secondary | ICD-10-CM

## 2014-04-06 DIAGNOSIS — C3430 Malignant neoplasm of lower lobe, unspecified bronchus or lung: Secondary | ICD-10-CM

## 2014-04-06 LAB — COMPREHENSIVE METABOLIC PANEL (CC13)
ALT: 6 U/L (ref 0–55)
AST: 14 U/L (ref 5–34)
Albumin: 3.3 g/dL — ABNORMAL LOW (ref 3.5–5.0)
Alkaline Phosphatase: 116 U/L (ref 40–150)
Anion Gap: 8 mEq/L (ref 3–11)
BUN: 4 mg/dL — ABNORMAL LOW (ref 7.0–26.0)
CO2: 29 mEq/L (ref 22–29)
CREATININE: 0.7 mg/dL (ref 0.6–1.1)
Calcium: 8.5 mg/dL (ref 8.4–10.4)
Chloride: 106 mEq/L (ref 98–109)
Glucose: 90 mg/dl (ref 70–140)
Potassium: 3.5 mEq/L (ref 3.5–5.1)
Sodium: 142 mEq/L (ref 136–145)
Total Bilirubin: 0.3 mg/dL (ref 0.20–1.20)
Total Protein: 6.4 g/dL (ref 6.4–8.3)

## 2014-04-06 LAB — CBC WITH DIFFERENTIAL/PLATELET
BASO%: 0.6 % (ref 0.0–2.0)
Basophils Absolute: 0 10*3/uL (ref 0.0–0.1)
EOS%: 2.5 % (ref 0.0–7.0)
Eosinophils Absolute: 0.1 10*3/uL (ref 0.0–0.5)
HCT: 39 % (ref 34.8–46.6)
HGB: 12.7 g/dL (ref 11.6–15.9)
LYMPH%: 19.2 % (ref 14.0–49.7)
MCH: 30.9 pg (ref 25.1–34.0)
MCHC: 32.6 g/dL (ref 31.5–36.0)
MCV: 94.9 fL (ref 79.5–101.0)
MONO#: 0.3 10*3/uL (ref 0.1–0.9)
MONO%: 7.6 % (ref 0.0–14.0)
NEUT#: 2.5 10*3/uL (ref 1.5–6.5)
NEUT%: 70.1 % (ref 38.4–76.8)
NRBC: 0 % (ref 0–0)
Platelets: 141 10*3/uL — ABNORMAL LOW (ref 145–400)
RBC: 4.11 10*6/uL (ref 3.70–5.45)
RDW: 12.6 % (ref 11.2–14.5)
WBC: 3.5 10*3/uL — AB (ref 3.9–10.3)
lymph#: 0.7 10*3/uL — ABNORMAL LOW (ref 0.9–3.3)

## 2014-04-06 MED ORDER — DIPHENHYDRAMINE HCL 50 MG/ML IJ SOLN
50.0000 mg | Freq: Once | INTRAMUSCULAR | Status: AC
  Administered 2014-04-06: 50 mg via INTRAVENOUS

## 2014-04-06 MED ORDER — FAMOTIDINE IN NACL 20-0.9 MG/50ML-% IV SOLN
20.0000 mg | Freq: Once | INTRAVENOUS | Status: AC
  Administered 2014-04-06: 20 mg via INTRAVENOUS

## 2014-04-06 MED ORDER — SODIUM CHLORIDE 0.9 % IV SOLN
475.5000 mg | Freq: Once | INTRAVENOUS | Status: AC
  Administered 2014-04-06: 480 mg via INTRAVENOUS
  Filled 2014-04-06: qty 48

## 2014-04-06 MED ORDER — DEXAMETHASONE SODIUM PHOSPHATE 20 MG/5ML IJ SOLN
INTRAMUSCULAR | Status: AC
  Filled 2014-04-06: qty 5

## 2014-04-06 MED ORDER — FAMOTIDINE IN NACL 20-0.9 MG/50ML-% IV SOLN
INTRAVENOUS | Status: AC
  Filled 2014-04-06: qty 50

## 2014-04-06 MED ORDER — DIPHENHYDRAMINE HCL 50 MG/ML IJ SOLN
INTRAMUSCULAR | Status: AC
  Filled 2014-04-06: qty 1

## 2014-04-06 MED ORDER — PACLITAXEL CHEMO INJECTION 300 MG/50ML
175.0000 mg/m2 | Freq: Once | INTRAVENOUS | Status: AC
  Administered 2014-04-06: 336 mg via INTRAVENOUS
  Filled 2014-04-06: qty 56

## 2014-04-06 MED ORDER — ONDANSETRON 16 MG/50ML IVPB (CHCC)
16.0000 mg | Freq: Once | INTRAVENOUS | Status: AC
  Administered 2014-04-06: 16 mg via INTRAVENOUS

## 2014-04-06 MED ORDER — DEXAMETHASONE SODIUM PHOSPHATE 20 MG/5ML IJ SOLN
20.0000 mg | Freq: Once | INTRAMUSCULAR | Status: AC
  Administered 2014-04-06: 20 mg via INTRAVENOUS

## 2014-04-06 MED ORDER — ONDANSETRON 16 MG/50ML IVPB (CHCC)
INTRAVENOUS | Status: AC
  Filled 2014-04-06: qty 16

## 2014-04-06 MED ORDER — SODIUM CHLORIDE 0.9 % IV SOLN
Freq: Once | INTRAVENOUS | Status: AC
  Administered 2014-04-06: 11:00:00 via INTRAVENOUS

## 2014-04-06 NOTE — Progress Notes (Signed)
Per Dr Julien Nordmann it is okay to give chemo today based on  CMET results from 03/25/14.

## 2014-04-06 NOTE — Patient Instructions (Signed)
[facility] Discharge Instructions for Patients Receiving Chemotherapy  Today you received the following chemotherapy agents:  Taxol and Carboplatin  To help prevent nausea and vomiting after your treatment, we encourage you to take your nausea medication as ordered per MD.   If you develop nausea and vomiting that is not controlled by your nausea medication, call the clinic.   BELOW ARE SYMPTOMS THAT SHOULD BE REPORTED IMMEDIATELY:  *FEVER GREATER THAN 100.5 F  *CHILLS WITH OR WITHOUT FEVER  NAUSEA AND VOMITING THAT IS NOT CONTROLLED WITH YOUR NAUSEA MEDICATION  *UNUSUAL SHORTNESS OF BREATH  *UNUSUAL BRUISING OR BLEEDING  TENDERNESS IN MOUTH AND THROAT WITH OR WITHOUT PRESENCE OF ULCERS  *URINARY PROBLEMS  *BOWEL PROBLEMS  UNUSUAL RASH Items with * indicate a potential emergency and should be followed up as soon as possible.  Feel free to call the clinic you have any questions or concerns. The clinic phone number is [phone].    

## 2014-04-07 ENCOUNTER — Encounter (HOSPITAL_COMMUNITY): Payer: Medicare Other | Attending: Hematology and Oncology

## 2014-04-07 ENCOUNTER — Ambulatory Visit: Payer: Medicare Other

## 2014-04-07 VITALS — BP 122/66 | HR 97 | Resp 18

## 2014-04-07 DIAGNOSIS — Z5189 Encounter for other specified aftercare: Secondary | ICD-10-CM

## 2014-04-07 DIAGNOSIS — C3490 Malignant neoplasm of unspecified part of unspecified bronchus or lung: Secondary | ICD-10-CM

## 2014-04-07 DIAGNOSIS — C3430 Malignant neoplasm of lower lobe, unspecified bronchus or lung: Secondary | ICD-10-CM | POA: Insufficient documentation

## 2014-04-07 MED ORDER — PEGFILGRASTIM INJECTION 6 MG/0.6ML
6.0000 mg | Freq: Once | SUBCUTANEOUS | Status: AC
Start: 2014-04-07 — End: 2014-04-07
  Administered 2014-04-07: 6 mg via SUBCUTANEOUS

## 2014-04-07 MED ORDER — PEGFILGRASTIM INJECTION 6 MG/0.6ML
SUBCUTANEOUS | Status: AC
  Filled 2014-04-07: qty 0.6

## 2014-04-07 NOTE — Progress Notes (Signed)
1345 - [name] presents today for injection per MD orders. Neulasta 6mg  administered SQ in left lower abdomen. Administration without incident. Patient tolerated well.

## 2014-04-13 ENCOUNTER — Encounter (HOSPITAL_BASED_OUTPATIENT_CLINIC_OR_DEPARTMENT_OTHER): Payer: Medicare Other

## 2014-04-13 ENCOUNTER — Other Ambulatory Visit: Payer: Medicare Other

## 2014-04-13 ENCOUNTER — Other Ambulatory Visit (HOSPITAL_COMMUNITY): Payer: Self-pay | Admitting: Oncology

## 2014-04-13 DIAGNOSIS — C3490 Malignant neoplasm of unspecified part of unspecified bronchus or lung: Secondary | ICD-10-CM

## 2014-04-13 DIAGNOSIS — C3430 Malignant neoplasm of lower lobe, unspecified bronchus or lung: Secondary | ICD-10-CM

## 2014-04-13 LAB — CBC WITH DIFFERENTIAL/PLATELET
BASOS PCT: 1 % (ref 0–1)
Basophils Absolute: 0.1 10*3/uL (ref 0.0–0.1)
EOS ABS: 0.1 10*3/uL (ref 0.0–0.7)
EOS PCT: 2 % (ref 0–5)
HEMATOCRIT: 39.9 % (ref 36.0–46.0)
HEMOGLOBIN: 13.1 g/dL (ref 12.0–15.0)
Lymphocytes Relative: 19 % (ref 12–46)
Lymphs Abs: 1.1 10*3/uL (ref 0.7–4.0)
MCH: 31.2 pg (ref 26.0–34.0)
MCHC: 32.8 g/dL (ref 30.0–36.0)
MCV: 95 fL (ref 78.0–100.0)
MONO ABS: 0.9 10*3/uL (ref 0.1–1.0)
MONOS PCT: 15 % — AB (ref 3–12)
Neutro Abs: 3.7 10*3/uL (ref 1.7–7.7)
Neutrophils Relative %: 63 % (ref 43–77)
Platelets: 88 10*3/uL — ABNORMAL LOW (ref 150–400)
RBC: 4.2 MIL/uL (ref 3.87–5.11)
RDW: 13 % (ref 11.5–15.5)
WBC: 5.8 10*3/uL (ref 4.0–10.5)

## 2014-04-13 LAB — COMPREHENSIVE METABOLIC PANEL
ALBUMIN: 3.6 g/dL (ref 3.5–5.2)
ALT: 11 U/L (ref 0–35)
AST: 17 U/L (ref 0–37)
Alkaline Phosphatase: 135 U/L — ABNORMAL HIGH (ref 39–117)
BUN: 11 mg/dL (ref 6–23)
CO2: 30 mEq/L (ref 19–32)
CREATININE: 0.93 mg/dL (ref 0.50–1.10)
Calcium: 8.7 mg/dL (ref 8.4–10.5)
Chloride: 99 mEq/L (ref 96–112)
GFR calc non Af Amer: 63 mL/min — ABNORMAL LOW (ref >90)
GFR, EST AFRICAN AMERICAN: 73 mL/min — AB (ref >90)
GLUCOSE: 100 mg/dL — AB (ref 70–99)
Potassium: 3 mEq/L — ABNORMAL LOW (ref 3.7–5.3)
Sodium: 139 mEq/L (ref 137–147)
TOTAL PROTEIN: 7 g/dL (ref 6.0–8.3)
Total Bilirubin: 0.2 mg/dL — ABNORMAL LOW (ref 0.3–1.2)

## 2014-04-13 NOTE — Progress Notes (Signed)
Quick Note:  Call patient with the result and order K Dur 20 meq po qd X 7 ______

## 2014-04-13 NOTE — Progress Notes (Signed)
Labs drawn today for cbc/diff,cmp 

## 2014-04-14 ENCOUNTER — Telehealth: Payer: Self-pay | Admitting: Medical Oncology

## 2014-04-14 DIAGNOSIS — E876 Hypokalemia: Secondary | ICD-10-CM

## 2014-04-14 DIAGNOSIS — R112 Nausea with vomiting, unspecified: Secondary | ICD-10-CM

## 2014-04-14 MED ORDER — ONDANSETRON 4 MG PO TBDP: 4.0000 mg | ORAL_TABLET | Freq: Three times a day (TID) | ORAL | Status: DC | PRN

## 2014-04-14 MED ORDER — POTASSIUM CHLORIDE CRYS ER 20 MEQ PO TBCR: 20.0000 meq | EXTENDED_RELEASE_TABLET | Freq: Every day | ORAL | Status: DC

## 2014-04-14 NOTE — Telephone Encounter (Signed)
I called pt re kdur rx and she said she has been nauseated and taking phenergan , but not effective. requests zofran . rx called in

## 2014-04-14 NOTE — Telephone Encounter (Signed)
Message copied by Ardeen Garland on Thu Apr 14, 2014  2:21 PM ------      Message from: Curt Bears      Created: Wed Apr 13, 2014  6:37 PM       Call patient with the result and order K Dur 20 meq po qd X 7 ------

## 2014-04-14 NOTE — Telephone Encounter (Signed)
Called in rx to pharmacy.

## 2014-04-20 ENCOUNTER — Encounter (HOSPITAL_BASED_OUTPATIENT_CLINIC_OR_DEPARTMENT_OTHER): Payer: Medicare Other

## 2014-04-20 ENCOUNTER — Other Ambulatory Visit: Payer: Medicare Other

## 2014-04-20 DIAGNOSIS — C3430 Malignant neoplasm of lower lobe, unspecified bronchus or lung: Secondary | ICD-10-CM

## 2014-04-20 DIAGNOSIS — C3490 Malignant neoplasm of unspecified part of unspecified bronchus or lung: Secondary | ICD-10-CM

## 2014-04-20 LAB — COMPREHENSIVE METABOLIC PANEL
ALBUMIN: 3 g/dL — AB (ref 3.5–5.2)
ALT: 7 U/L (ref 0–35)
AST: 15 U/L (ref 0–37)
Alkaline Phosphatase: 146 U/L — ABNORMAL HIGH (ref 39–117)
BUN: 5 mg/dL — ABNORMAL LOW (ref 6–23)
CO2: 29 mEq/L (ref 19–32)
CREATININE: 0.64 mg/dL (ref 0.50–1.10)
Calcium: 8.4 mg/dL (ref 8.4–10.5)
Chloride: 103 mEq/L (ref 96–112)
GFR calc Af Amer: >90 mL/min (ref >90)
GFR calc non Af Amer: >90 mL/min (ref >90)
Glucose, Bld: 117 mg/dL — ABNORMAL HIGH (ref 70–99)
POTASSIUM: 3.9 meq/L (ref 3.7–5.3)
Sodium: 142 mEq/L (ref 137–147)
Total Bilirubin: 0.2 mg/dL — ABNORMAL LOW (ref 0.3–1.2)
Total Protein: 6.5 g/dL (ref 6.0–8.3)

## 2014-04-20 LAB — CBC WITH DIFFERENTIAL/PLATELET
BASOS ABS: 0 10*3/uL (ref 0.0–0.1)
Basophils Relative: 0 % (ref 0–1)
EOS PCT: 0 % (ref 0–5)
Eosinophils Absolute: 0 10*3/uL (ref 0.0–0.7)
HCT: 36.5 % (ref 36.0–46.0)
Hemoglobin: 12 g/dL (ref 12.0–15.0)
LYMPHS ABS: 0.7 10*3/uL (ref 0.7–4.0)
LYMPHS PCT: 13 % (ref 12–46)
MCH: 31.4 pg (ref 26.0–34.0)
MCHC: 32.9 g/dL (ref 30.0–36.0)
MCV: 95.5 fL (ref 78.0–100.0)
Monocytes Absolute: 0.4 10*3/uL (ref 0.1–1.0)
Monocytes Relative: 7 % (ref 3–12)
NEUTROS ABS: 4.2 10*3/uL (ref 1.7–7.7)
Neutrophils Relative %: 80 % — ABNORMAL HIGH (ref 43–77)
Platelets: 156 10*3/uL (ref 150–400)
RBC: 3.82 MIL/uL — AB (ref 3.87–5.11)
RDW: 13.1 % (ref 11.5–15.5)
WBC: 5.3 10*3/uL (ref 4.0–10.5)

## 2014-04-20 NOTE — Progress Notes (Signed)
Labs drawn today for cbc/diff,cmp 

## 2014-04-25 ENCOUNTER — Telehealth: Payer: Self-pay | Admitting: *Deleted

## 2014-04-25 ENCOUNTER — Other Ambulatory Visit: Payer: Medicare Other

## 2014-04-25 ENCOUNTER — Ambulatory Visit (HOSPITAL_COMMUNITY): Payer: Medicare Other

## 2014-04-25 ENCOUNTER — Telehealth: Payer: Self-pay | Admitting: Medical Oncology

## 2014-04-25 NOTE — Telephone Encounter (Signed)
I confirmed appt for this week.

## 2014-04-25 NOTE — Telephone Encounter (Signed)
Patient called regarding her times for her appts on 4/29. I have transferred hr to the desk RN

## 2014-04-27 ENCOUNTER — Encounter: Payer: Self-pay | Admitting: Internal Medicine

## 2014-04-27 ENCOUNTER — Emergency Department (HOSPITAL_COMMUNITY): Payer: Medicare Other

## 2014-04-27 ENCOUNTER — Encounter (HOSPITAL_COMMUNITY): Payer: Self-pay | Admitting: Emergency Medicine

## 2014-04-27 ENCOUNTER — Emergency Department (HOSPITAL_COMMUNITY)
Admission: EM | Admit: 2014-04-27 | Discharge: 2014-04-27 | Disposition: A | Discharge status: Home / Self Care | Payer: Medicare Other | Attending: Emergency Medicine | Admitting: Emergency Medicine

## 2014-04-27 ENCOUNTER — Ambulatory Visit (HOSPITAL_BASED_OUTPATIENT_CLINIC_OR_DEPARTMENT_OTHER): Payer: Medicare Other

## 2014-04-27 ENCOUNTER — Ambulatory Visit (HOSPITAL_BASED_OUTPATIENT_CLINIC_OR_DEPARTMENT_OTHER): Payer: Medicare Other | Admitting: Internal Medicine

## 2014-04-27 ENCOUNTER — Other Ambulatory Visit (HOSPITAL_BASED_OUTPATIENT_CLINIC_OR_DEPARTMENT_OTHER): Payer: Medicare Other

## 2014-04-27 VITALS — BP 148/87 | HR 81

## 2014-04-27 VITALS — BP 112/65 | HR 75 | Temp 98.4°F | Resp 19 | Ht 66.0 in | Wt 176.6 lb

## 2014-04-27 DIAGNOSIS — F329 Major depressive disorder, single episode, unspecified: Secondary | ICD-10-CM | POA: Insufficient documentation

## 2014-04-27 DIAGNOSIS — J449 Chronic obstructive pulmonary disease, unspecified: Secondary | ICD-10-CM | POA: Insufficient documentation

## 2014-04-27 DIAGNOSIS — C3490 Malignant neoplasm of unspecified part of unspecified bronchus or lung: Secondary | ICD-10-CM

## 2014-04-27 DIAGNOSIS — I10 Essential (primary) hypertension: Secondary | ICD-10-CM | POA: Insufficient documentation

## 2014-04-27 DIAGNOSIS — C3430 Malignant neoplasm of lower lobe, unspecified bronchus or lung: Secondary | ICD-10-CM

## 2014-04-27 DIAGNOSIS — Z5111 Encounter for antineoplastic chemotherapy: Secondary | ICD-10-CM

## 2014-04-27 DIAGNOSIS — Z8673 Personal history of transient ischemic attack (TIA), and cerebral infarction without residual deficits: Secondary | ICD-10-CM | POA: Insufficient documentation

## 2014-04-27 DIAGNOSIS — Z923 Personal history of irradiation: Secondary | ICD-10-CM | POA: Insufficient documentation

## 2014-04-27 DIAGNOSIS — W010XXA Fall on same level from slipping, tripping and stumbling without subsequent striking against object, initial encounter: Secondary | ICD-10-CM | POA: Insufficient documentation

## 2014-04-27 DIAGNOSIS — Y9389 Activity, other specified: Secondary | ICD-10-CM | POA: Insufficient documentation

## 2014-04-27 DIAGNOSIS — W19XXXA Unspecified fall, initial encounter: Secondary | ICD-10-CM

## 2014-04-27 DIAGNOSIS — Z8669 Personal history of other diseases of the nervous system and sense organs: Secondary | ICD-10-CM | POA: Insufficient documentation

## 2014-04-27 DIAGNOSIS — F17200 Nicotine dependence, unspecified, uncomplicated: Secondary | ICD-10-CM | POA: Insufficient documentation

## 2014-04-27 DIAGNOSIS — Z8739 Personal history of other diseases of the musculoskeletal system and connective tissue: Secondary | ICD-10-CM | POA: Insufficient documentation

## 2014-04-27 DIAGNOSIS — F3289 Other specified depressive episodes: Secondary | ICD-10-CM | POA: Insufficient documentation

## 2014-04-27 DIAGNOSIS — Z981 Arthrodesis status: Secondary | ICD-10-CM | POA: Insufficient documentation

## 2014-04-27 DIAGNOSIS — F4310 Post-traumatic stress disorder, unspecified: Secondary | ICD-10-CM | POA: Insufficient documentation

## 2014-04-27 DIAGNOSIS — S42213A Unspecified displaced fracture of surgical neck of unspecified humerus, initial encounter for closed fracture: Secondary | ICD-10-CM | POA: Insufficient documentation

## 2014-04-27 DIAGNOSIS — Z79899 Other long term (current) drug therapy: Secondary | ICD-10-CM | POA: Insufficient documentation

## 2014-04-27 DIAGNOSIS — F411 Generalized anxiety disorder: Secondary | ICD-10-CM | POA: Insufficient documentation

## 2014-04-27 DIAGNOSIS — G8929 Other chronic pain: Secondary | ICD-10-CM | POA: Insufficient documentation

## 2014-04-27 DIAGNOSIS — J4489 Other specified chronic obstructive pulmonary disease: Secondary | ICD-10-CM | POA: Insufficient documentation

## 2014-04-27 DIAGNOSIS — Y929 Unspecified place or not applicable: Secondary | ICD-10-CM | POA: Insufficient documentation

## 2014-04-27 LAB — CBC WITH DIFFERENTIAL/PLATELET
BASO%: 0.8 % (ref 0.0–2.0)
BASOS ABS: 0 10*3/uL (ref 0.0–0.1)
EOS ABS: 0 10*3/uL (ref 0.0–0.5)
EOS%: 0.6 % (ref 0.0–7.0)
HCT: 35.8 % (ref 34.8–46.6)
HGB: 12 g/dL (ref 11.6–15.9)
LYMPH%: 11.5 % — AB (ref 14.0–49.7)
MCH: 31.5 pg (ref 25.1–34.0)
MCHC: 33.5 g/dL (ref 31.5–36.0)
MCV: 94.2 fL (ref 79.5–101.0)
MONO#: 0.4 10*3/uL (ref 0.1–0.9)
MONO%: 6.6 % (ref 0.0–14.0)
NEUT%: 80.5 % — ABNORMAL HIGH (ref 38.4–76.8)
NEUTROS ABS: 4.6 10*3/uL (ref 1.5–6.5)
PLATELETS: 236 10*3/uL (ref 145–400)
RBC: 3.8 10*6/uL (ref 3.70–5.45)
RDW: 13.3 % (ref 11.2–14.5)
WBC: 5.7 10*3/uL (ref 3.9–10.3)
lymph#: 0.7 10*3/uL — ABNORMAL LOW (ref 0.9–3.3)

## 2014-04-27 LAB — COMPREHENSIVE METABOLIC PANEL (CC13)
ALK PHOS: 144 U/L (ref 40–150)
ALT: <6 U/L (ref 0–55)
AST: 12 U/L (ref 5–34)
Albumin: 3.1 g/dL — ABNORMAL LOW (ref 3.5–5.0)
Anion Gap: 10 mEq/L (ref 3–11)
BILIRUBIN TOTAL: 0.3 mg/dL (ref 0.20–1.20)
BUN: 5.8 mg/dL — AB (ref 7.0–26.0)
CO2: 25 mEq/L (ref 22–29)
Calcium: 9.2 mg/dL (ref 8.4–10.4)
Chloride: 104 mEq/L (ref 98–109)
Creatinine: 0.8 mg/dL (ref 0.6–1.1)
Glucose: 127 mg/dl (ref 70–140)
Potassium: 3.8 mEq/L (ref 3.5–5.1)
Sodium: 140 mEq/L (ref 136–145)
Total Protein: 6.5 g/dL (ref 6.4–8.3)

## 2014-04-27 MED ORDER — HYDROMORPHONE HCL PF 1 MG/ML IJ SOLN
1.0000 mg | Freq: Once | INTRAMUSCULAR | Status: AC
  Administered 2014-04-27: 1 mg via INTRAMUSCULAR
  Filled 2014-04-27: qty 1

## 2014-04-27 MED ORDER — DIPHENHYDRAMINE HCL 50 MG/ML IJ SOLN
50.0000 mg | Freq: Once | INTRAMUSCULAR | Status: AC
  Administered 2014-04-27: 50 mg via INTRAVENOUS

## 2014-04-27 MED ORDER — PACLITAXEL CHEMO INJECTION 300 MG/50ML
175.0000 mg/m2 | Freq: Once | INTRAVENOUS | Status: AC
  Administered 2014-04-27: 336 mg via INTRAVENOUS
  Filled 2014-04-27: qty 56

## 2014-04-27 MED ORDER — FAMOTIDINE IN NACL 20-0.9 MG/50ML-% IV SOLN
INTRAVENOUS | Status: AC
  Filled 2014-04-27: qty 50

## 2014-04-27 MED ORDER — DEXAMETHASONE SODIUM PHOSPHATE 20 MG/5ML IJ SOLN
INTRAMUSCULAR | Status: AC
  Filled 2014-04-27: qty 5

## 2014-04-27 MED ORDER — SODIUM CHLORIDE 0.9 % IV SOLN
475.5000 mg | Freq: Once | INTRAVENOUS | Status: AC
  Administered 2014-04-27: 480 mg via INTRAVENOUS
  Filled 2014-04-27: qty 48

## 2014-04-27 MED ORDER — DIPHENHYDRAMINE HCL 50 MG/ML IJ SOLN
INTRAMUSCULAR | Status: AC
  Filled 2014-04-27: qty 1

## 2014-04-27 MED ORDER — DEXAMETHASONE SODIUM PHOSPHATE 20 MG/5ML IJ SOLN
20.0000 mg | Freq: Once | INTRAMUSCULAR | Status: AC
  Administered 2014-04-27: 20 mg via INTRAVENOUS

## 2014-04-27 MED ORDER — ONDANSETRON 16 MG/50ML IVPB (CHCC)
16.0000 mg | Freq: Once | INTRAVENOUS | Status: AC
  Administered 2014-04-27: 16 mg via INTRAVENOUS

## 2014-04-27 MED ORDER — ONDANSETRON 16 MG/50ML IVPB (CHCC)
INTRAVENOUS | Status: AC
  Filled 2014-04-27: qty 16

## 2014-04-27 MED ORDER — SODIUM CHLORIDE 0.9 % IV SOLN
Freq: Once | INTRAVENOUS | Status: AC
  Administered 2014-04-27: 11:00:00 via INTRAVENOUS

## 2014-04-27 MED ORDER — FAMOTIDINE IN NACL 20-0.9 MG/50ML-% IV SOLN
20.0000 mg | Freq: Once | INTRAVENOUS | Status: AC
  Administered 2014-04-27: 20 mg via INTRAVENOUS

## 2014-04-27 NOTE — Progress Notes (Signed)
Labs within tx parameters. Patient tolerated infusion well. Did have some confusion and drowsiness due to [facility], Dr. Julien Nordmann made aware to dose reduce benadryl.

## 2014-04-27 NOTE — Patient Instructions (Signed)
Smoking Cessation Quitting smoking is important to your health and has many advantages. However, it is not always easy to quit since nicotine is a very addictive drug. Often times, people try 3 times or more before being able to quit. This document explains the best ways for you to prepare to quit smoking. Quitting takes hard work and a lot of effort, but you can do it. ADVANTAGES OF QUITTING SMOKING  You will live longer, feel better, and live better.  Your body will feel the impact of quitting smoking almost immediately.  Within 20 minutes, blood pressure decreases. Your pulse returns to its normal level.  After 8 hours, carbon monoxide levels in the blood return to normal. Your oxygen level increases.  After 24 hours, the chance of having a heart attack starts to decrease. Your breath, hair, and body stop smelling like smoke.  After 48 hours, damaged nerve endings begin to recover. Your sense of taste and smell improve.  After 72 hours, the body is virtually free of nicotine. Your bronchial tubes relax and breathing becomes easier.  After 2 to 12 weeks, lungs can hold more air. Exercise becomes easier and circulation improves.  The risk of having a heart attack, stroke, cancer, or lung disease is greatly reduced.  After 1 year, the risk of coronary heart disease is cut in half.  After 5 years, the risk of stroke falls to the same as a nonsmoker.  After 10 years, the risk of lung cancer is cut in half and the risk of other cancers decreases significantly.  After 15 years, the risk of coronary heart disease drops, usually to the level of a nonsmoker.  If you are pregnant, quitting smoking will improve your chances of having a healthy baby.  The people you live with, especially any children, will be healthier.  You will have extra money to spend on things other than cigarettes. QUESTIONS TO THINK ABOUT BEFORE ATTEMPTING TO QUIT You may want to talk about your answers with your  caregiver.  Why do you want to quit?  If you tried to quit in the past, what helped and what did not?  What will be the most difficult situations for you after you quit? How will you plan to handle them?  Who can help you through the tough times? Your family? Friends? A caregiver?  What pleasures do you get from smoking? What ways can you still get pleasure if you quit? Here are some questions to ask your caregiver:  How can you help me to be successful at quitting?  What medicine do you think would be best for me and how should I take it?  What should I do if I need more help?  What is smoking withdrawal like? How can I get information on withdrawal? GET READY  Set a quit date.  Change your environment by getting rid of all cigarettes, ashtrays, matches, and lighters in your home, car, or work. Do not let people smoke in your home.  Review your past attempts to quit. Think about what worked and what did not. GET SUPPORT AND ENCOURAGEMENT You have a better chance of being successful if you have help. You can get support in many ways.  Tell your family, friends, and co-workers that you are going to quit and need their support. Ask them not to smoke around you.  Get individual, group, or telephone counseling and support. Programs are available at local hospitals and health centers. Call your local health department for   information about programs in your area.  Spiritual beliefs and practices may help some smokers quit.  Download a "quit meter" on your computer to keep track of quit statistics, such as how long you have gone without smoking, cigarettes not smoked, and money saved.  Get a self-help book about quitting smoking and staying off of tobacco. LEARN NEW SKILLS AND BEHAVIORS  Distract yourself from urges to smoke. Talk to someone, go for a walk, or occupy your time with a task.  Change your normal routine. Take a different route to work. Drink tea instead of coffee.  Eat breakfast in a different place.  Reduce your stress. Take a hot bath, exercise, or read a book.  Plan something enjoyable to do every day. Reward yourself for not smoking.  Explore interactive web-based programs that specialize in helping you quit. GET MEDICINE AND USE IT CORRECTLY Medicines can help you stop smoking and decrease the urge to smoke. Combining medicine with the above behavioral methods and support can greatly increase your chances of successfully quitting smoking.  Nicotine replacement therapy helps deliver nicotine to your body without the negative effects and risks of smoking. Nicotine replacement therapy includes nicotine gum, lozenges, inhalers, nasal sprays, and skin patches. Some may be available over-the-counter and others require a prescription.  Antidepressant medicine helps people abstain from smoking, but how this works is unknown. This medicine is available by prescription.  Nicotinic receptor partial agonist medicine simulates the effect of nicotine in your brain. This medicine is available by prescription. Ask your caregiver for advice about which medicines to use and how to use them based on your health history. Your caregiver will tell you what side effects to look out for if you choose to be on a medicine or therapy. Carefully read the information on the package. Do not use any other product containing nicotine while using a nicotine replacement product.  RELAPSE OR DIFFICULT SITUATIONS Most relapses occur within the first 3 months after quitting. Do not be discouraged if you start smoking again. Remember, most people try several times before finally quitting. You may have symptoms of withdrawal because your body is used to nicotine. You may crave cigarettes, be irritable, feel very hungry, cough often, get headaches, or have difficulty concentrating. The withdrawal symptoms are only temporary. They are strongest when you first quit, but they will go away within  10 14 days. To reduce the chances of relapse, try to:  Avoid drinking alcohol. Drinking lowers your chances of successfully quitting.  Reduce the amount of caffeine you consume. Once you quit smoking, the amount of caffeine in your body increases and can give you symptoms, such as a rapid heartbeat, sweating, and anxiety.  Avoid smokers because they can make you want to smoke.  Do not let weight gain distract you. Many smokers will gain weight when they quit, usually less than 10 pounds. Eat a healthy diet and stay active. You can always lose the weight gained after you quit.  Find ways to improve your mood other than smoking. FOR MORE INFORMATION  www.smokefree.gov  Document Released: 12/10/2001 Document Revised: 06/16/2012 Document Reviewed: 03/26/2012 [employer] Patient Information 2014 [employer]  

## 2014-04-27 NOTE — Progress Notes (Signed)
[facility] [phone]   [phone]) [place], [place]  [place] [address]  DIAGNOSIS: Locally advanced, likely stage IIIA (T2a., N2, M0) non-small cell lung cancer, squamous cell carcinoma diagnosed in September of 2014.   PRIOR THERAPY: Concurrent chemoradiation with weekly carboplatin for AUC of 2 and paclitaxel 45 mg/M2, status post 7 cycles, last cycle was given 11/29/2013 with partial response. First dose was given on 10/18/2013.    CURRENT THERAPY: Systemic chemotherapy with carboplatin for AUC of 5 and paclitaxel 175 mg/M2 every 3 weeks with Neulasta support. First cycle on 04/06/2014. Status post 1 cycle.  CHEMOTHERAPY INTENT: Curative/control  CURRENT # OF CHEMOTHERAPY CYCLES: 2 CURRENT ANTIEMETICS: Zofran, Compazine and dexamethasone  CURRENT SMOKING STATUS: Current smoker and she was strongly advised to quit smoking and also to smoke cessation program  ORAL CHEMOTHERAPY AND CONSENT: None  CURRENT BISPHOSPHONATES USE: None  PAIN MANAGEMENT: 0/10  NARCOTICS INDUCED CONSTIPATION: None  LIVING WILL AND CODE STATUS: Full code  INTERVAL HISTORY: [name] 68 y.o. female returns to the clinic today for follow up visit. She is currently undergoing consolidation chemotherapy with carboplatin and paclitaxel is status post 1 cycle and tolerated the first cycle of her treatment fairly well except for mild fatigue and aching pain after the Neulasta injection. She denied having any significant chest pain but continues to have shortness of breath with exertion with no hemoptysis. She denied having any significant weight loss or night sweats. The patient unfortunately continues to smoke 3 cigarettes every day but she is trying to quit but not successful and she is not interested in the smoke cessation programs. She is considering electronic cigarettes. She denied having any significant fever or chills, no  nausea or vomiting.   MEDICAL HISTORY: Past Medical History  Diagnosis Date  . H/O: stroke 2012, 2000,     X3  . Hypertension   . Anxiety   . PTSD (post-traumatic stress disorder)   . Depression   . SVD (spontaneous vaginal delivery)     x 1  . Smoker   . Osteoarthritis     knees, hips hands,   . Shortness of breath   . Pain     SEVERE PAIN BACK, RIGHT HIP AND KNEES--HARRINGTON RODS AND CERVICAL PLATES--USES WALKER OR CANE WHEN AMBULATING - GOES TO A PAIN CLINIC-STATES SHE NEEDS RT HIP AND BOTH KNEES REPLACED. PT STATES SHE WAS TOLD THE CEMENT AROUND THE HARRINGTON RODS IS CRACKED.  [name] Hot flashes, menopausal     SEVERE  . Family history of anesthesia complication     equipment failure caused problem  . Complication of anesthesia 03/02/13    severe headache,vertigo postop  . COPD (chronic obstructive pulmonary disease)     stable  . 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 Lung cancer   . Status post radiation therapy within four to twelve weeks 10/18/13-12/06/13    lung ca  66Gy    ALLERGIES:  is allergic to prednisone; nsaids; aspirin; and dulera.  MEDICATIONS:  Current Outpatient Prescriptions  Medication Sig Dispense Refill  . Aclidinium Bromide (TUDORZA PRESSAIR) 400 MCG/ACT AEPB Inhale 1 puff into the lungs 2 (two) times daily.      [name] albuterol (PROVENTIL) (2.5 MG/3ML) 0.083% nebulizer solution Take 2.5 mg by nebulization as needed for wheezing.      [name] albuterol (VENTOLIN HFA) 108 (90 BASE) MCG/ACT inhaler Inhale 2  puffs into the lungs every 6 (six) hours as needed for wheezing or shortness of breath.  1 Inhaler  11  . ALPRAZolam (XANAX) 1 MG tablet Take 1 tablet (1 mg total) by mouth every 6 (six) hours as needed for anxiety.  30 tablet  0  . atenolol (TENORMIN) 50 MG tablet Take 1 tablet (50 mg total) by mouth daily.  30 tablet  0  . chlorzoxazone (PARAFON) 500 MG tablet Take 500 mg by mouth 4 (four) times daily as needed for muscle spasms.      [name]  dextromethorphan-guaiFENesin (MUCINEX DM) 30-600 MG per 12 hr tablet Take 1 tablet by mouth every 12 (twelve) hours.      [name] esomeprazole (NEXIUM) 40 MG capsule Take 40 mg by mouth daily.       . fentaNYL (DURAGESIC - DOSED MCG/HR) 25 MCG/HR patch Place 25 mcg onto the skin every 3 (three) days.      . nicotine (NICODERM CQ - DOSED IN MG/24 HOURS) 14 mg/24hr patch Place 1 patch onto the skin daily as needed.      . ondansetron (ZOFRAN ODT) 4 MG disintegrating tablet Take 1 tablet (4 mg total) by mouth every 8 (eight) hours as needed for nausea or vomiting.  20 tablet  0  . oxyCODONE-acetaminophen (PERCOCET) 10-325 MG per tablet Take 1 tablet by mouth every 6 (six) hours as needed for pain.  60 tablet  0  . potassium chloride SA (K-DUR,KLOR-CON) 20 MEQ tablet Take 1 tablet (20 mEq total) by mouth daily.  7 tablet  0  . sertraline (ZOLOFT) 100 MG tablet Take 200 mg by mouth daily.       . sucralfate (CARAFATE) 1 G tablet Take 1 tablet (1 g total) by mouth 4 (four) times daily.  120 tablet  2  . trazodone (DESYREL) 300 MG tablet Take 300 mg by mouth at bedtime.       No current facility-administered medications for this visit.    SURGICAL HISTORY:  Past Surgical History  Procedure Laterality Date  . Carpal tunnel release      BILATERAL  . Nasal sinus surgery    . Lumbar fusion      L4-S1  . Cervical fusion    . Hernia repair    . Salpingoophorectomy Right   . Vaginal hysterectomy      TAH w/ ovary removal  . Transthoracic echocardiogram  12-03-2011    LVSF NORMAL/ EF 60-65%  . Vulvectomy N/A 03/02/2013    Procedure: WIDE LOCAL EXCISION VULVAR;  Surgeon: Imagene Gurney A. Alycia Rossetti, MD;  Location: [facility];  Service: Gynecology;  Laterality: N/A;  . Co2 laser application N/A 0/34/7425    Procedure: LASER APPLICATION OF THE VULVA;  Surgeon: Janie Morning, MD;  Location: [facility];  Service: Gynecology;  Laterality: N/A;  . Video bronchoscopy Bilateral 09/23/2013    Procedure: VIDEO  BRONCHOSCOPY WITHOUT FLUORO;  Surgeon: Tanda Rockers, MD;  Location: [facility];  Service: Cardiopulmonary;  Laterality: Bilateral;    REVIEW OF SYSTEMS:  Constitutional: positive for fatigue Eyes: negative Ears, nose, mouth, throat, and face: negative Respiratory: positive for cough, dyspnea on exertion and sputum Cardiovascular: negative Gastrointestinal: negative Genitourinary:negative Integument/breast: negative Hematologic/lymphatic: negative Musculoskeletal:positive for muscle weakness Neurological: negative Behavioral/Psych: negative Endocrine: negative Allergic/Immunologic: negative   PHYSICAL EXAMINATION: General appearance: alert, cooperative, fatigued and no distress Head: Normocephalic, without obvious abnormality, atraumatic Neck: no adenopathy, no JVD, supple, symmetrical, trachea midline and thyroid not enlarged, symmetric, no tenderness/mass/nodules Lymph  nodes: Cervical, supraclavicular, and axillary nodes normal. Resp: wheezes bilaterally Back: symmetric, no curvature. ROM normal. No CVA tenderness. Cardio: regular rate and rhythm, S1, S2 normal, no murmur, click, rub or gallop GI: soft, non-tender; bowel sounds normal; no masses,  no organomegaly Extremities: extremities normal, atraumatic, no cyanosis or edema Neurologic: Alert and oriented X 3, normal strength and tone. Normal symmetric reflexes. Normal coordination and gait  ECOG PERFORMANCE STATUS: 2 - Symptomatic, <50% confined to bed  There were no vitals taken for this visit.  LABORATORY DATA: Lab Results  Component Value Date   WBC 5.3 04/20/2014   HGB 12.0 04/20/2014   HCT 36.5 04/20/2014   MCV 95.5 04/20/2014   PLT 156 04/20/2014      Chemistry      Component Value Date/Time   NA 142 04/20/2014 1108   NA 142 04/06/2014 0952   K 3.9 04/20/2014 1108   K 3.5 04/06/2014 0952   CL 103 04/20/2014 1108   CO2 29 04/20/2014 1108   CO2 29 04/06/2014 0952   BUN 5* 04/20/2014 1108   BUN 4.0* 04/06/2014 0952    CREATININE 0.64 04/20/2014 1108   CREATININE 0.7 04/06/2014 0952      Component Value Date/Time   CALCIUM 8.4 04/20/2014 1108   CALCIUM 8.5 04/06/2014 0952   ALKPHOS 146* 04/20/2014 1108   ALKPHOS 116 04/06/2014 0952   AST 15 04/20/2014 1108   AST 14 04/06/2014 0952   ALT 7 04/20/2014 1108   ALT 6 04/06/2014 0952   BILITOT 0.2* 04/20/2014 1108   BILITOT 0.30 04/06/2014 0952       RADIOGRAPHIC STUDIES:  ASSESSMENT AND PLAN: This is a very pleasant 68 years old white female with stage IIIA non-small cell lung cancer completed a course of concurrent chemoradiation with weekly carboplatin and paclitaxel and tolerating her treatment fairly well. Her most recent scan showed enlarging right infrahilar mass was collapse/consolidation in the right lower lobe and volume loss in the right middle lobe concerning for disease progression. She is currently undergoing systemic chemotherapy with carboplatin and paclitaxel is status post 1 cycle. She tolerated the first cycle of her treatment fairly well. We will proceed with cycle #2 today as scheduled. She would come back for follow up visit in 3 weeks with the start of cycle #3. She will continue to have her weekly lab at [facility] except the day of her treatment it would be performed at the [facility] in [place]. She will also receive the Neulasta injection tomorrow at [facility]. She was advised to call immediately if she has any concerning symptoms in the interval. The patient voices understanding of current disease status and treatment options and is in agreement with the current care plan.  All questions were answered. The patient knows to call the clinic with any problems, questions or concerns. We can certainly see the patient much sooner if necessary.  Disclaimer: This note was dictated with voice recognition software. Similar sounding words can inadvertently be transcribed and may not be corrected upon review.

## 2014-04-27 NOTE — Discharge Instructions (Signed)
As discussed, it is important that you follow up as soon as possible with your physicians for continued management of your condition. ° °If you develop any new, or concerning changes in your condition, please return to the emergency department immediately. ° ° ° °

## 2014-04-27 NOTE — Patient Instructions (Signed)
[facility] Discharge Instructions for Patients Receiving Chemotherapy  Today you received the following chemotherapy agents: Taxol, Carboplatin  To help prevent nausea and vomiting after your treatment, we encourage you to take your nausea medication: Phenergan 25 mg every 6 hrs as needed.    If you develop nausea and vomiting that is not controlled by your nausea medication, call the clinic.   BELOW ARE SYMPTOMS THAT SHOULD BE REPORTED IMMEDIATELY:  *FEVER GREATER THAN 100.5 F  *CHILLS WITH OR WITHOUT FEVER  NAUSEA AND VOMITING THAT IS NOT CONTROLLED WITH YOUR NAUSEA MEDICATION  *UNUSUAL SHORTNESS OF BREATH  *UNUSUAL BRUISING OR BLEEDING  TENDERNESS IN MOUTH AND THROAT WITH OR WITHOUT PRESENCE OF ULCERS  *URINARY PROBLEMS  *BOWEL PROBLEMS  UNUSUAL RASH Items with * indicate a potential emergency and should be followed up as soon as possible.  Feel free to call the clinic you have any questions or concerns. The clinic phone number is [phone].

## 2014-04-27 NOTE — ED Provider Notes (Signed)
CSN: [id number]     Arrival date & time 04/27/14  1816 History   First MD Initiated Contact with Patient 04/27/14 1823     Chief Complaint  Patient presents with  . Fall     (Consider location/radiation/quality/duration/timing/severity/associated sxs/prior Treatment) HPI Patient presents after a mechanical fall pain in her left shoulder, as well as diffuse body pain. Patient has a notable history of chronic pain, as well as lung cancer for which is currently receiving chemotherapy. Patient last had chemotherapy today. She states that upon arrival to her home she slipped, fell onto her left side. She has not been in Lip[facility] since the fall.  The pain is severe, worse with any motion. Patient typically takes narcotics and uses a fentanyl patch for pain control. She currently denies head pain, confusion, disorientation and weakness in any extremity.  Past Medical History  Diagnosis Date  . H/O: stroke 2012, 2000,     X3  . Hypertension   . Anxiety   . PTSD (post-traumatic stress disorder)   . Depression   . SVD (spontaneous vaginal delivery)     x 1  . Smoker   . Osteoarthritis     knees, hips hands,   . Shortness of breath   . Pain     SEVERE PAIN BACK, RIGHT HIP AND KNEES--HARRINGTON RODS AND CERVICAL PLATES--USES WALKER OR CANE WHEN AMBULATING - GOES TO A PAIN CLINIC-STATES SHE NEEDS RT HIP AND BOTH KNEES REPLACED. PT STATES SHE WAS TOLD THE CEMENT AROUND THE HARRINGTON RODS IS CRACKED.  [name] Hot flashes, menopausal     SEVERE  . Family history of anesthesia complication     equipment failure caused problem  . Complication of anesthesia 03/02/13    severe headache,vertigo postop  . COPD (chronic obstructive pulmonary disease)     stable  . 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 Lung cancer   . Status post radiation therapy within four to twelve weeks 10/18/13-12/06/13    lung ca  66Gy   Past Surgical History  Procedure Laterality Date  . Carpal tunnel release      BILATERAL    . Nasal sinus surgery    . Lumbar fusion      L4-S1  . Cervical fusion    . Hernia repair    . Salpingoophorectomy Right   . Vaginal hysterectomy      TAH w/ ovary removal  . Transthoracic echocardiogram  12-03-2011    LVSF NORMAL/ EF 60-65%  . Vulvectomy N/A 03/02/2013    Procedure: WIDE LOCAL EXCISION VULVAR;  Surgeon: Imagene Gurney A. Alycia Rossetti, MD;  Location: [facility];  Service: Gynecology;  Laterality: N/A;  . Co2 laser application N/A 4/40/3474    Procedure: LASER APPLICATION OF THE VULVA;  Surgeon: Janie Morning, MD;  Location: [facility];  Service: Gynecology;  Laterality: N/A;  . Video bronchoscopy Bilateral 09/23/2013    Procedure: VIDEO BRONCHOSCOPY WITHOUT FLUORO;  Surgeon: Tanda Rockers, MD;  Location: [facility];  Service: Cardiopulmonary;  Laterality: Bilateral;   Family History  Problem Relation Age of Onset  . Cancer Mother     COLON  . Heart disease Father   . Diabetes Maternal Aunt   . Cancer Maternal Grandfather     KIDNEY   . Diabetes Maternal Grandfather   . Hypertension Maternal Grandfather   . Heart disease Maternal Grandfather    History  Substance Use Topics  . Smoking status: Light Tobacco Smoker -- 0.50 packs/day for 35  years    Types: Cigarettes  . Smokeless tobacco: Never Used     Comment: 4cigs per day 02/17/13  . Alcohol Use: No   OB History   Grav Para Term Preterm Abortions TAB SAB Ect Mult Living   2 1   1     1      Review of Systems  Constitutional:       Per HPI, otherwise negative  HENT:       Per HPI, otherwise negative  Respiratory:       Per HPI, otherwise negative  Cardiovascular:       Per HPI, otherwise negative  Gastrointestinal: Negative for vomiting.  Endocrine:       Negative aside from HPI  Genitourinary:       Neg aside from HPI   Musculoskeletal:       Per HPI, otherwise negative  Skin: Negative.   Neurological: Positive for weakness. Negative for syncope.      Allergies  Carafate; Prednisone;  Nsaids; Aspirin; and Dulera  Home Medications   Prior to Admission medications   Medication Sig Start Date End Date Taking? Authorizing Provider  Aclidinium Bromide (TUDORZA PRESSAIR) 400 MCG/ACT AEPB Inhale 1 puff into the lungs 2 (two) times daily.    Historical Provider, MD  albuterol (PROVENTIL) (2.5 MG/3ML) 0.083% nebulizer solution Take 2.5 mg by nebulization as needed for wheezing.    Historical Provider, MD  albuterol (VENTOLIN HFA) 108 (90 BASE) MCG/ACT inhaler Inhale 2 puffs into the lungs every 6 (six) hours as needed for wheezing or shortness of breath. 11/03/13   Tanda Rockers, MD  ALPRAZolam Duanne Moron) 1 MG tablet Take 1 tablet (1 mg total) by mouth every 6 (six) hours as needed for anxiety. 01/01/14   Kinnie Feil, MD  atenolol (TENORMIN) 50 MG tablet Take 1 tablet (50 mg total) by mouth daily. 11/25/13   Velvet Bathe, MD  chlorzoxazone (PARAFON) 500 MG tablet Take 500 mg by mouth 4 (four) times daily as needed for muscle spasms.    Historical Provider, MD  dextromethorphan-guaiFENesin (MUCINEX DM) 30-600 MG per 12 hr tablet Take 1 tablet by mouth every 12 (twelve) hours.    Historical Provider, MD  esomeprazole (NEXIUM) 40 MG capsule Take 40 mg by mouth daily.     Historical Provider, MD  fentaNYL (DURAGESIC - DOSED MCG/HR) 25 MCG/HR patch Place 25 mcg onto the skin every 3 (three) days.    Historical Provider, MD  nicotine (NICODERM CQ - DOSED IN MG/24 HOURS) 14 mg/24hr patch Place 1 patch onto the skin daily as needed. 09/06/13   Radene Gunning, NP  ondansetron (ZOFRAN ODT) 4 MG disintegrating tablet Take 1 tablet (4 mg total) by mouth every 8 (eight) hours as needed for nausea or vomiting. 04/14/14   Curt Bears, MD  oxyCODONE-acetaminophen (PERCOCET) 10-325 MG per tablet Take 1 tablet by mouth every 6 (six) hours as needed for pain. 01/01/14   Kinnie Feil, MD  potassium chloride SA (K-DUR,KLOR-CON) 20 MEQ tablet Take 1 tablet (20 mEq total) by mouth daily. 04/14/14   Curt Bears, MD  promethazine (PHENERGAN) 25 MG tablet Take 25 mg by mouth every 6 (six) hours as needed for nausea or vomiting.    Historical Provider, MD  sertraline (ZOLOFT) 100 MG tablet Take 200 mg by mouth daily.     Historical Provider, MD  trazodone (DESYREL) 300 MG tablet Take 300 mg by mouth at bedtime.    Historical Provider, MD  BP 180/95  Pulse 94  Temp(Src) 97.5 F (36.4 C) (Oral)  Resp 19  Ht 5\' 6"  (1.676 m)  Wt 170 lb (77.111 kg)  BMI 27.45 kg/m2  SpO2 89% Physical Exam  Nursing note and vitals reviewed. Constitutional: She is oriented to person, place, and time. She appears well-developed and well-nourished. No distress.  Very uncomfortable appearing, elderly appearing female on a backboard, with a cervical collar in place.  HENT:  Head: Normocephalic and atraumatic.  Superficial laceration on her left eye, otherwise atraumatic  Eyes: Conjunctivae and EOM are normal.  Cardiovascular: Normal rate and regular rhythm.   Pulmonary/Chest: Effort normal and breath sounds normal. No stridor. No respiratory distress.  Abdominal: She exhibits no distension.  Musculoskeletal: She exhibits no edema.       Right shoulder: Normal.       Left shoulder: She exhibits tenderness, bony tenderness, swelling and deformity.       Right elbow: Normal.      Left elbow: No tenderness found.       Left wrist: Normal.       Legs: Patient flexes both knees, extends both knees independently, with pain in the back, but not in the knees.   Neurological: She is alert and oriented to person, place, and time. No cranial nerve deficit.  Skin: Skin is warm and dry.  Psychiatric: She has a normal mood and affect.    ED Course  Procedures (including critical care time)  Imaging Review Dg Pelvis 1-2 Views  04/27/2014   CLINICAL DATA:  Pelvic pain after fall.  EXAM: PELVIS - 1-2 VIEW  COMPARISON:  None.  FINDINGS: There is no evidence of pelvic fracture or diastasis. Severe degenerative joint  disease is seen involving both hips.  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 No acute abnormality seen in the pelvis.   Electronically Signed   By: Sabino Dick M.D.   On: 04/27/2014 20:16   Dg Wrist Complete Left  04/27/2014   CLINICAL DATA:  [place].  Wrist pain.  EXAM: LEFT WRIST - COMPLETE 3+ VIEW  COMPARISON:  None.  FINDINGS: Mild degenerative changes are noted.  No acute fracture.  IMPRESSION: Degenerative changes but no acute wrist fracture.   Electronically Signed   By: Kalman Jewels M.D.   On: 04/27/2014 21:18   Ct Head Wo Contrast  04/27/2014   CLINICAL DATA:  [place].  Headache.  EXAM: CT HEAD WITHOUT CONTRAST  TECHNIQUE: Contiguous axial images were obtained from the base of the skull through the vertex without intravenous contrast.  COMPARISON:  11/22/2013  FINDINGS: The ventricles are normal in size and configuration. No extra-axial fluid collections are identified. The gray-white differentiation is normal. No CT findings for acute intracranial process such as hemorrhage or infarction. No mass lesions. The brainstem and cerebellum are grossly normal.The bony structures are intact. The paranasal sinuses and mastoid air cells are clear. The globes are intact.  IMPRESSION: No acute intracranial findings.  No change since prior study.   Electronically Signed   By: Kalman Jewels M.D.   On: 04/27/2014 20:18   Ct Cervical Spine Wo Contrast  04/27/2014   CLINICAL DATA:  Fall.  EXAM: CT CERVICAL SPINE WITHOUT CONTRAST  TECHNIQUE: Multidetector CT imaging of the cervical spine was performed without intravenous contrast. Multiplanar CT image reconstructions were also generated.  COMPARISON:  01/04/2010  FINDINGS: Normal alignment of the cervical spine. There are post surgical changes compatible with C5 through C7 anterior cervical disc fusion.  The vertebral body heights are well maintained and there is no evidence for cervical spine fracture. The facet joints are well aligned. Facet  joints appear well-aligned. Degenerative changes are noted at the atlantoaxial joint. Calcified atherosclerotic disease involves the coronary arteries. Sebaceous cyst is identified within the left posterior neck, image 9/series 9.  IMPRESSION: 1. No acute findings. 2. Prior fusion of C5-3 C7.   Electronically Signed   By: Kerby Moors M.D.   On: 04/27/2014 20:12   Dg Shoulder Left  04/27/2014   CLINICAL DATA:  Severe LEFT shoulder pain post fall tonight  EXAM: LEFT SHOULDER - 2+ VIEW  COMPARISON:  None  FINDINGS: Osseous demineralization.  Degenerative changes AC joint.  Comminuted displaced proximal LEFT humeral fracture.  Fracture planes are seen at the surgical neck and greater tuberosity.  No dislocation.  Visualized LEFT ribs appear intact.  IMPRESSION: Comminuted displaced proximal LEFT humeral fracture.   Electronically Signed   By: Lavonia Dana M.D.   On: 04/27/2014 20:16    After the initial evaluation I reviewed the patient's chart, including chemotherapy notes from today.   After I reviewed the initial x-ray, patient described this pain.  Subsequent wrist x-ray did not show fracture.   9:48 PM Patient sitting upright, speaking clearly, smiling. Discussed all findings, return precautions, follow up instructions. Patient's son was with her.  MDM   Patient presents with a mechanical fall with pain in the left shoulder, left arm X-rays demonstrate fracture of proximal humerus. Patient is otherwise unremarkable radiographic findings, was discharged in stable condition to follow up with orthopedics.    Carmin Muskrat, MD 04/27/14 2149

## 2014-04-27 NOTE — ED Notes (Signed)
Chemo today for 6 hours,  Fell after chemo at home.  Came in by ems.  Immobilized.  C/o pain lower back,  Neck and left shoulder.  Superficial laceration below left eye.

## 2014-04-28 ENCOUNTER — Ambulatory Visit: Payer: Medicare Other | Admitting: Internal Medicine

## 2014-04-28 ENCOUNTER — Telehealth: Payer: Self-pay | Admitting: Medical Oncology

## 2014-04-28 ENCOUNTER — Other Ambulatory Visit: Payer: Medicare Other

## 2014-04-28 ENCOUNTER — Ambulatory Visit (HOSPITAL_COMMUNITY): Payer: Medicare Other

## 2014-04-28 ENCOUNTER — Encounter (HOSPITAL_BASED_OUTPATIENT_CLINIC_OR_DEPARTMENT_OTHER): Payer: Medicare Other

## 2014-04-28 ENCOUNTER — Ambulatory Visit: Payer: Medicare Other

## 2014-04-28 VITALS — BP 112/66 | HR 80 | Temp 97.9°F | Resp 18

## 2014-04-28 DIAGNOSIS — C3490 Malignant neoplasm of unspecified part of unspecified bronchus or lung: Secondary | ICD-10-CM

## 2014-04-28 DIAGNOSIS — C3430 Malignant neoplasm of lower lobe, unspecified bronchus or lung: Secondary | ICD-10-CM

## 2014-04-28 DIAGNOSIS — Z5189 Encounter for other specified aftercare: Secondary | ICD-10-CM

## 2014-04-28 MED ORDER — PEGFILGRASTIM INJECTION 6 MG/0.6ML
6.0000 mg | Freq: Once | SUBCUTANEOUS | Status: AC
  Administered 2014-04-28: 6 mg via SUBCUTANEOUS

## 2014-04-28 NOTE — Telephone Encounter (Signed)
Margorie John reported pt fell yesterday and "fractured  Left  shoulder and 3 fingers on left hand" . I spoke to pt and she fell at home after chemo yesterday. She went to [facility] and was evaluated in ED . She has a splint and is having a hard time putting it on and off She said she is taping her broken fingers together. She has a neighbor coming in to help her with sling today . [name] Pt CANCELLED neulasta today. . Pt stated she is  going to [facility] now to get neulasta.

## 2014-04-28 NOTE — Progress Notes (Signed)
[name] presents today for injection per MD orders. Neulasta 6mg  administered SQ in right Abdomen. Administration without incident. Patient tolerated well.

## 2014-04-30 ENCOUNTER — Emergency Department (HOSPITAL_COMMUNITY): Payer: Medicare Other

## 2014-04-30 ENCOUNTER — Emergency Department (HOSPITAL_COMMUNITY)
Admission: EM | Admit: 2014-04-30 | Discharge: 2014-04-30 | Disposition: A | Discharge status: Home / Self Care | Payer: Medicare Other | Attending: Emergency Medicine | Admitting: Emergency Medicine

## 2014-04-30 ENCOUNTER — Encounter (HOSPITAL_COMMUNITY): Payer: Self-pay | Admitting: Emergency Medicine

## 2014-04-30 DIAGNOSIS — J4489 Other specified chronic obstructive pulmonary disease: Secondary | ICD-10-CM | POA: Insufficient documentation

## 2014-04-30 DIAGNOSIS — I10 Essential (primary) hypertension: Secondary | ICD-10-CM | POA: Insufficient documentation

## 2014-04-30 DIAGNOSIS — F17200 Nicotine dependence, unspecified, uncomplicated: Secondary | ICD-10-CM | POA: Insufficient documentation

## 2014-04-30 DIAGNOSIS — Y939 Activity, unspecified: Secondary | ICD-10-CM | POA: Insufficient documentation

## 2014-04-30 DIAGNOSIS — J449 Chronic obstructive pulmonary disease, unspecified: Secondary | ICD-10-CM | POA: Insufficient documentation

## 2014-04-30 DIAGNOSIS — M1990 Unspecified osteoarthritis, unspecified site: Secondary | ICD-10-CM | POA: Insufficient documentation

## 2014-04-30 DIAGNOSIS — Z85118 Personal history of other malignant neoplasm of bronchus and lung: Secondary | ICD-10-CM | POA: Insufficient documentation

## 2014-04-30 DIAGNOSIS — F3289 Other specified depressive episodes: Secondary | ICD-10-CM | POA: Insufficient documentation

## 2014-04-30 DIAGNOSIS — Z8673 Personal history of transient ischemic attack (TIA), and cerebral infarction without residual deficits: Secondary | ICD-10-CM | POA: Insufficient documentation

## 2014-04-30 DIAGNOSIS — W19XXXA Unspecified fall, initial encounter: Secondary | ICD-10-CM

## 2014-04-30 DIAGNOSIS — F329 Major depressive disorder, single episode, unspecified: Secondary | ICD-10-CM | POA: Insufficient documentation

## 2014-04-30 DIAGNOSIS — S42302A Unspecified fracture of shaft of humerus, left arm, initial encounter for closed fracture: Secondary | ICD-10-CM

## 2014-04-30 DIAGNOSIS — W06XXXA Fall from bed, initial encounter: Secondary | ICD-10-CM | POA: Insufficient documentation

## 2014-04-30 DIAGNOSIS — F411 Generalized anxiety disorder: Secondary | ICD-10-CM | POA: Insufficient documentation

## 2014-04-30 DIAGNOSIS — Z8742 Personal history of other diseases of the female genital tract: Secondary | ICD-10-CM | POA: Insufficient documentation

## 2014-04-30 DIAGNOSIS — IMO0001 Reserved for inherently not codable concepts without codable children: Secondary | ICD-10-CM | POA: Insufficient documentation

## 2014-04-30 DIAGNOSIS — IMO0002 Reserved for concepts with insufficient information to code with codable children: Secondary | ICD-10-CM | POA: Insufficient documentation

## 2014-04-30 DIAGNOSIS — Y929 Unspecified place or not applicable: Secondary | ICD-10-CM | POA: Insufficient documentation

## 2014-04-30 DIAGNOSIS — F4310 Post-traumatic stress disorder, unspecified: Secondary | ICD-10-CM | POA: Insufficient documentation

## 2014-04-30 LAB — URINALYSIS, ROUTINE W REFLEX MICROSCOPIC
BILIRUBIN URINE: NEGATIVE
Glucose, UA: NEGATIVE mg/dL
Hgb urine dipstick: NEGATIVE
KETONES UR: NEGATIVE mg/dL
LEUKOCYTES UA: NEGATIVE
Nitrite: NEGATIVE
PH: 6 (ref 5.0–8.0)
PROTEIN: NEGATIVE mg/dL
Specific Gravity, Urine: 1.01 (ref 1.005–1.030)
Urobilinogen, UA: 0.2 mg/dL (ref 0.0–1.0)

## 2014-04-30 LAB — BASIC METABOLIC PANEL
BUN: 12 mg/dL (ref 6–23)
CALCIUM: 8.5 mg/dL (ref 8.4–10.5)
CHLORIDE: 99 meq/L (ref 96–112)
CO2: 30 mEq/L (ref 19–32)
CREATININE: 0.73 mg/dL (ref 0.50–1.10)
GFR calc non Af Amer: 87 mL/min — ABNORMAL LOW (ref >90)
Glucose, Bld: 109 mg/dL — ABNORMAL HIGH (ref 70–99)
Potassium: 3.9 mEq/L (ref 3.7–5.3)
Sodium: 137 mEq/L (ref 137–147)

## 2014-04-30 LAB — CBC
HCT: 29.4 % — ABNORMAL LOW (ref 36.0–46.0)
Hemoglobin: 9.4 g/dL — ABNORMAL LOW (ref 12.0–15.0)
MCH: 31.2 pg (ref 26.0–34.0)
MCHC: 32 g/dL (ref 30.0–36.0)
MCV: 97.7 fL (ref 78.0–100.0)
Platelets: 146 10*3/uL — ABNORMAL LOW (ref 150–400)
RBC: 3.01 MIL/uL — ABNORMAL LOW (ref 3.87–5.11)
RDW: 15.2 % (ref 11.5–15.5)
WBC: 21.1 10*3/uL — AB (ref 4.0–10.5)

## 2014-04-30 MED ORDER — HYDROCODONE-ACETAMINOPHEN 5-325 MG PO TABS
1.0000 | ORAL_TABLET | Freq: Once | ORAL | Status: AC
  Administered 2014-04-30: 1 via ORAL

## 2014-04-30 MED ORDER — SODIUM CHLORIDE 0.9 % IV SOLN
1000.0000 mL | Freq: Once | INTRAVENOUS | Status: AC
  Administered 2014-04-30: 1000 mL via INTRAVENOUS

## 2014-04-30 MED ORDER — HYDROCODONE-ACETAMINOPHEN 5-325 MG PO TABS
ORAL_TABLET | ORAL | Status: AC
  Administered 2014-04-30: 1 via ORAL
  Filled 2014-04-30: qty 1

## 2014-04-30 MED ORDER — SODIUM CHLORIDE 0.9 % IV SOLN
1000.0000 mL | INTRAVENOUS | Status: DC
  Administered 2014-04-30: 1000 mL via INTRAVENOUS

## 2014-04-30 NOTE — ED Notes (Signed)
While questioning patient about how her fall occurred, she told two separate stories and did not appear to be aware of it.  When asked if she had been confused, she stated no.

## 2014-04-30 NOTE — ED Notes (Signed)
Pt fell tonight, complaining of left shoulder & left knee pain.

## 2014-04-30 NOTE — ED Notes (Signed)
Continue to await arrival of pt's transportation home

## 2014-04-30 NOTE — ED Provider Notes (Signed)
CSN: [id number]     Arrival date & time 04/30/14  0133 History   First MD Initiated Contact with Patient 04/30/14 0134     Chief Complaint  Patient presents with  . Fall      HPI Patient reports she was in bed and she fell out of bed reporting pain in her pelvis.  She states no weakness of her arms or legs.  She was seen in emergency department several days ago from a mechanical fall that resulted in a proximal left humerus fracture.  She denies fevers and chills.  She denies nausea vomiting diarrhea.  No headache or neck pain.  She denies head injury during her fall.  She reports pain in her low back.  She can move both of her legs.  She denies hip pain.  Does report ongoing pain in her left shoulder left proximal humeral region where her fracture was.   Past Medical History  Diagnosis Date  . H/O: stroke 2012, 2000,     X3  . Hypertension   . Anxiety   . PTSD (post-traumatic stress disorder)   . Depression   . SVD (spontaneous vaginal delivery)     x 1  . Smoker   . Osteoarthritis     knees, hips hands,   . Shortness of breath   . Pain     SEVERE PAIN BACK, RIGHT HIP AND KNEES--HARRINGTON RODS AND CERVICAL PLATES--USES WALKER OR CANE WHEN AMBULATING - GOES TO A PAIN CLINIC-STATES SHE NEEDS RT HIP AND BOTH KNEES REPLACED. PT STATES SHE WAS TOLD THE CEMENT AROUND THE HARRINGTON RODS IS CRACKED.  [name] Hot flashes, menopausal     SEVERE  . Family history of anesthesia complication     equipment failure caused problem  . Complication of anesthesia 03/02/13    severe headache,vertigo postop  . COPD (chronic obstructive pulmonary disease)     stable  . 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 Lung cancer   . Status post radiation therapy within four to twelve weeks 10/18/13-12/06/13    lung ca  66Gy   Past Surgical History  Procedure Laterality Date  . Carpal tunnel release      BILATERAL  . Nasal sinus surgery    . Lumbar fusion      L4-S1  . Cervical fusion    . Hernia repair    .  Salpingoophorectomy Right   . Vaginal hysterectomy      TAH w/ ovary removal  . Transthoracic echocardiogram  12-03-2011    LVSF NORMAL/ EF 60-65%  . Vulvectomy N/A 03/02/2013    Procedure: WIDE LOCAL EXCISION VULVAR;  Surgeon: Imagene Gurney A. Alycia Rossetti, MD;  Location: [facility];  Service: Gynecology;  Laterality: N/A;  . Co2 laser application N/A 6/71/2458    Procedure: LASER APPLICATION OF THE VULVA;  Surgeon: Janie Morning, MD;  Location: [facility];  Service: Gynecology;  Laterality: N/A;  . Video bronchoscopy Bilateral 09/23/2013    Procedure: VIDEO BRONCHOSCOPY WITHOUT FLUORO;  Surgeon: Tanda Rockers, MD;  Location: [facility];  Service: Cardiopulmonary;  Laterality: Bilateral;   Family History  Problem Relation Age of Onset  . Cancer Mother     COLON  . Heart disease Father   . Diabetes Maternal Aunt   . Cancer Maternal Grandfather     KIDNEY   . Diabetes Maternal Grandfather   . Hypertension Maternal Grandfather   . Heart disease Maternal Grandfather    History  Substance Use Topics  .  Smoking status: Light Tobacco Smoker -- 0.50 packs/day for 35 years    Types: Cigarettes  . Smokeless tobacco: Never Used     Comment: 4cigs per day 02/17/13  . Alcohol Use: No   OB History   Grav Para Term Preterm Abortions TAB SAB Ect Mult Living   2 1   1     1      Review of Systems  All other systems reviewed and are negative.     Allergies  Carafate; Prednisone; Nsaids; Aspirin; and Dulera  Home Medications   Prior to Admission medications   Medication Sig Start Date End Date Taking? Authorizing Provider  Aclidinium Bromide (TUDORZA PRESSAIR) 400 MCG/ACT AEPB Inhale 1 puff into the lungs 2 (two) times daily.    Historical Provider, MD  albuterol (PROVENTIL) (2.5 MG/3ML) 0.083% nebulizer solution Take 2.5 mg by nebulization as needed for wheezing.    Historical Provider, MD  albuterol (VENTOLIN HFA) 108 (90 BASE) MCG/ACT inhaler Inhale 2 puffs into the lungs every 6  (six) hours as needed for wheezing or shortness of breath. 11/03/13   Tanda Rockers, MD  ALPRAZolam Duanne Moron) 1 MG tablet Take 1 tablet (1 mg total) by mouth every 6 (six) hours as needed for anxiety. 01/01/14   Kinnie Feil, MD  atenolol (TENORMIN) 50 MG tablet Take 1 tablet (50 mg total) by mouth daily. 11/25/13   Velvet Bathe, MD  chlorzoxazone (PARAFON) 500 MG tablet Take 500 mg by mouth 4 (four) times daily as needed for muscle spasms.    Historical Provider, MD  dextromethorphan-guaiFENesin (MUCINEX DM) 30-600 MG per 12 hr tablet Take 1 tablet by mouth every 12 (twelve) hours.    Historical Provider, MD  esomeprazole (NEXIUM) 40 MG capsule Take 40 mg by mouth daily.     Historical Provider, MD  fentaNYL (DURAGESIC - DOSED MCG/HR) 25 MCG/HR patch Place 25 mcg onto the skin every 3 (three) days.    Historical Provider, MD  nicotine (NICODERM CQ - DOSED IN MG/24 HOURS) 14 mg/24hr patch Place 1 patch onto the skin daily as needed. 09/06/13   Radene Gunning, NP  ondansetron (ZOFRAN ODT) 4 MG disintegrating tablet Take 1 tablet (4 mg total) by mouth every 8 (eight) hours as needed for nausea or vomiting. 04/14/14   Curt Bears, MD  oxyCODONE-acetaminophen (PERCOCET) 10-325 MG per tablet Take 1 tablet by mouth every 6 (six) hours as needed for pain. 01/01/14   Kinnie Feil, MD  potassium chloride SA (K-DUR,KLOR-CON) 20 MEQ tablet Take 1 tablet (20 mEq total) by mouth daily. 04/14/14   Curt Bears, MD  promethazine (PHENERGAN) 25 MG tablet Take 25 mg by mouth every 6 (six) hours as needed for nausea or vomiting.    Historical Provider, MD  sertraline (ZOLOFT) 100 MG tablet Take 200 mg by mouth daily.     Historical Provider, MD  trazodone (DESYREL) 300 MG tablet Take 300 mg by mouth at bedtime.    Historical Provider, MD   BP 90/47  Pulse 90  Resp 20  SpO2 100% Physical Exam  Nursing note and vitals reviewed. Constitutional: She is oriented to person, place, and time. She appears  well-developed and well-nourished. No distress.  HENT:  Head: Normocephalic and atraumatic.  Eyes: EOM are normal.  Neck: Normal range of motion.  Cardiovascular: Normal rate, regular rhythm and normal heart sounds.   Pulmonary/Chest: Effort normal and breath sounds normal.  Abdominal: Soft. She exhibits no distension. There is no tenderness.  Musculoskeletal: Normal range of motion.  Full range of motion of bilateral ankles knees and hips.  Full range of motion of right wrist, right elbow, right shoulder.  Full range of motion of left wrist, left elbow.  She has a ongoing pain with range of motion of the left shoulder.  There is bruising noted in the left humeral and shoulder region which appears old.  Normal left radial pulse.  No thoracic or lumbar tenderness.  Mild tenderness to her posterior iliac crest left greater than right.  Neurological: She is alert and oriented to person, place, and time.  Skin: Skin is warm and dry.  Psychiatric: She has a normal mood and affect. Judgment normal.    ED Course  Procedures (including critical care time) Labs Review Labs Reviewed  CBC - Abnormal; Notable for the following:    WBC 21.1 (*)    RBC 3.01 (*)    Hemoglobin 9.4 (*)    HCT 29.4 (*)    Platelets 146 (*)    All other components within normal limits  BASIC METABOLIC PANEL - Abnormal; Notable for the following:    Glucose, Bld 109 (*)    GFR calc non Af Amer 87 (*)    All other components within normal limits  URINALYSIS, ROUTINE W REFLEX MICROSCOPIC    Imaging Review Dg Chest 1 View  04/30/2014   CLINICAL DATA:  Shortness of breath, weakness, off wheezing.  EXAM: CHEST - 1 VIEW  COMPARISON:  CT CHEST W/CM dated 03/25/2014; DG CHEST 2 VIEW dated 12/31/2013; DG WRIST COMPLETE*L* dated 04/27/2014; DG PELVIS 1-2 VIEWS dated 04/30/2014; DG SHOULDER*L* dated 04/27/2014  FINDINGS: Normal heart size and pulmonary vascularity. Lucency in the upper lung suggesting emphysema. Fibrosis in the lung  bases. Consolidation in the right mid lung behind the heart is again demonstrated and corresponds to mass and consolidation on previous chest CT. No definite blunting of costophrenic angles. Comminuted fracture of the proximal left humerus. This was seen on previous left shoulder radiograph from 04/27/2014.  IMPRESSION: Consolidation in the right lung base medially is again demonstrated and consistent with known pathologic process. Emphysematous changes in the lungs. Fracture proximal left humerus.   Electronically Signed   By: Lucienne Capers M.D.   On: 04/30/2014 04:08   Dg Pelvis 1-2 Views  04/30/2014   CLINICAL DATA:  Pelvic/low back pain status post fall  EXAM: PELVIS - 1-2 VIEW  COMPARISON:  04/27/2014  FINDINGS: No fracture or dislocation is seen.  Visualized bony pelvis appears intact.  Mild to moderate degenerative changes of the bilateral hips.  Postsurgical changes involving the lower lumbosacral spine, which is poorly visualized.  IMPRESSION: No fracture or dislocation is seen.   Electronically Signed   By: Julian Hy M.D.   On: 04/30/2014 02:37  I personally reviewed the imaging tests through PACS system I reviewed available ER/hospitalization records through the EMR    EKG Interpretation None      MDM   Final diagnoses:  Fall  Fracture of left humerus    Patient feels better at this time.  She was able to ambulate in the emergency department.  I spoke with the patient's son is currently out of town but he reports that she has a neighbor who looks in on her daily.  He will inform the neighbor check on her in the morning.  The patient states she is able to get around her house.  She has a walker which she can use as well  as a cane.  Her urine and chest show no signs of infection.  Her vital signs are good.  She has a mild focal neuro exam.  No headache or neck pain.  C-spine cleared clinically.  She be given IV fluids here.  She feels better.  She is on chronic pain medications  including fentanyl patches.  These may need to be reevaluated by primary care physician as a decrease in strength may help some of her symptoms as well.    Hoy Morn, MD 04/30/14 [phone]

## 2014-04-30 NOTE — ED Notes (Signed)
Ambulated patient to BR w/assist x 1.  She is a little unsteady and weak and cannot use L arm to brace herself.

## 2014-04-30 NOTE — ED Notes (Signed)
Reviewed d/c instructions w/patient and allowed for questions.  Patient is stable, but remains drowsy.  Called her neighbor Harl Favor to pick her up from ER. Removed IV site and applied bandaid.

## 2014-05-02 ENCOUNTER — Ambulatory Visit: Payer: Medicare Other | Admitting: Internal Medicine

## 2014-05-04 ENCOUNTER — Other Ambulatory Visit: Payer: Medicare Other

## 2014-05-04 ENCOUNTER — Other Ambulatory Visit (HOSPITAL_COMMUNITY): Payer: Medicare Other

## 2014-05-06 ENCOUNTER — Telehealth: Payer: Self-pay | Admitting: Internal Medicine

## 2014-05-11 ENCOUNTER — Other Ambulatory Visit (HOSPITAL_COMMUNITY): Payer: Medicare Other

## 2014-05-11 ENCOUNTER — Other Ambulatory Visit: Payer: Medicare Other

## 2014-05-13 ENCOUNTER — Other Ambulatory Visit: Payer: Self-pay | Admitting: Medical Oncology

## 2014-05-17 ENCOUNTER — Other Ambulatory Visit: Payer: Medicare Other

## 2014-05-17 ENCOUNTER — Ambulatory Visit: Payer: Medicare Other | Admitting: Physician Assistant

## 2014-05-17 ENCOUNTER — Telehealth: Payer: Self-pay | Admitting: *Deleted

## 2014-05-17 NOTE — Telephone Encounter (Signed)
Pt called and left a vm that she is needing shoulder surgery.  She has chemo scheduled tomorrow 5/20 and missed her lab and f/u 5/19.  Tried to call pt back, line was busy, then no answer.  Called pt's son [name] and he was not aware at this point that she was needing shoulder surgery only that she was seeing a few doctors about her injury.  Will attempt to call pt back 5/20.  SLJ

## 2014-05-18 ENCOUNTER — Ambulatory Visit: Payer: Medicare Other

## 2014-05-18 ENCOUNTER — Other Ambulatory Visit: Payer: Medicare Other

## 2014-05-18 NOTE — Telephone Encounter (Signed)
Attempted to call patient again.  No answer.  Unable to leave message.  Per Dr Vista Mink, she would not be able to get chemo without a lab and f/u appt.  SLJ

## 2014-05-19 ENCOUNTER — Ambulatory Visit (HOSPITAL_COMMUNITY): Payer: Medicare Other

## 2014-05-19 ENCOUNTER — Ambulatory Visit: Payer: Medicare Other

## 2014-05-20 ENCOUNTER — Encounter (HOSPITAL_COMMUNITY): Payer: Self-pay | Admitting: Pharmacy Technician

## 2014-05-20 ENCOUNTER — Encounter (HOSPITAL_COMMUNITY): Payer: Self-pay | Admitting: *Deleted

## 2014-05-24 ENCOUNTER — Encounter (HOSPITAL_COMMUNITY)
Admission: RE | Disposition: A | Discharge status: Skilled Nursing Facility | Payer: Self-pay | Source: Ambulatory Visit | Attending: Orthopedic Surgery

## 2014-05-24 ENCOUNTER — Inpatient Hospital Stay (HOSPITAL_COMMUNITY): Payer: Medicare Other | Admitting: Anesthesiology

## 2014-05-24 ENCOUNTER — Inpatient Hospital Stay (HOSPITAL_COMMUNITY)
Admission: RE | Admit: 2014-05-24 | Discharge: 2014-05-27 | DRG: 483 | Disposition: A | Discharge status: Skilled Nursing Facility | Payer: Medicare Other | Source: Ambulatory Visit | Attending: Orthopedic Surgery | Admitting: Orthopedic Surgery

## 2014-05-24 ENCOUNTER — Encounter (HOSPITAL_COMMUNITY): Payer: Self-pay

## 2014-05-24 ENCOUNTER — Encounter (HOSPITAL_COMMUNITY): Payer: Medicare Other | Admitting: Anesthesiology

## 2014-05-24 ENCOUNTER — Inpatient Hospital Stay (HOSPITAL_COMMUNITY): Payer: Medicare Other

## 2014-05-24 DIAGNOSIS — M169 Osteoarthritis of hip, unspecified: Secondary | ICD-10-CM | POA: Diagnosis present

## 2014-05-24 DIAGNOSIS — K219 Gastro-esophageal reflux disease without esophagitis: Secondary | ICD-10-CM | POA: Diagnosis present

## 2014-05-24 DIAGNOSIS — F3289 Other specified depressive episodes: Secondary | ICD-10-CM | POA: Diagnosis present

## 2014-05-24 DIAGNOSIS — J449 Chronic obstructive pulmonary disease, unspecified: Secondary | ICD-10-CM | POA: Diagnosis present

## 2014-05-24 DIAGNOSIS — X58XXXA Exposure to other specified factors, initial encounter: Secondary | ICD-10-CM | POA: Diagnosis present

## 2014-05-24 DIAGNOSIS — S42213A Unspecified displaced fracture of surgical neck of unspecified humerus, initial encounter for closed fracture: Principal | ICD-10-CM | POA: Diagnosis present

## 2014-05-24 DIAGNOSIS — M19049 Primary osteoarthritis, unspecified hand: Secondary | ICD-10-CM | POA: Diagnosis present

## 2014-05-24 DIAGNOSIS — D62 Acute posthemorrhagic anemia: Secondary | ICD-10-CM | POA: Diagnosis not present

## 2014-05-24 DIAGNOSIS — J4489 Other specified chronic obstructive pulmonary disease: Secondary | ICD-10-CM | POA: Diagnosis present

## 2014-05-24 DIAGNOSIS — M1610 Unilateral primary osteoarthritis, unspecified hip: Secondary | ICD-10-CM | POA: Diagnosis present

## 2014-05-24 DIAGNOSIS — I10 Essential (primary) hypertension: Secondary | ICD-10-CM | POA: Diagnosis present

## 2014-05-24 DIAGNOSIS — Z886 Allergy status to analgesic agent status: Secondary | ICD-10-CM

## 2014-05-24 DIAGNOSIS — Z981 Arthrodesis status: Secondary | ICD-10-CM

## 2014-05-24 DIAGNOSIS — M1710 Unilateral primary osteoarthritis, unspecified knee: Secondary | ICD-10-CM | POA: Diagnosis present

## 2014-05-24 DIAGNOSIS — F4310 Post-traumatic stress disorder, unspecified: Secondary | ICD-10-CM | POA: Diagnosis present

## 2014-05-24 DIAGNOSIS — F329 Major depressive disorder, single episode, unspecified: Secondary | ICD-10-CM | POA: Diagnosis present

## 2014-05-24 DIAGNOSIS — Z8249 Family history of ischemic heart disease and other diseases of the circulatory system: Secondary | ICD-10-CM

## 2014-05-24 DIAGNOSIS — Z96619 Presence of unspecified artificial shoulder joint: Secondary | ICD-10-CM

## 2014-05-24 DIAGNOSIS — Z888 Allergy status to other drugs, medicaments and biological substances status: Secondary | ICD-10-CM

## 2014-05-24 DIAGNOSIS — F17200 Nicotine dependence, unspecified, uncomplicated: Secondary | ICD-10-CM | POA: Diagnosis present

## 2014-05-24 DIAGNOSIS — Z833 Family history of diabetes mellitus: Secondary | ICD-10-CM

## 2014-05-24 DIAGNOSIS — Z8673 Personal history of transient ischemic attack (TIA), and cerebral infarction without residual deficits: Secondary | ICD-10-CM

## 2014-05-24 DIAGNOSIS — Z8051 Family history of malignant neoplasm of kidney: Secondary | ICD-10-CM

## 2014-05-24 DIAGNOSIS — F411 Generalized anxiety disorder: Secondary | ICD-10-CM | POA: Diagnosis present

## 2014-05-24 DIAGNOSIS — Z79899 Other long term (current) drug therapy: Secondary | ICD-10-CM

## 2014-05-24 DIAGNOSIS — C3490 Malignant neoplasm of unspecified part of unspecified bronchus or lung: Secondary | ICD-10-CM | POA: Diagnosis present

## 2014-05-24 DIAGNOSIS — Z923 Personal history of irradiation: Secondary | ICD-10-CM

## 2014-05-24 DIAGNOSIS — Z800 Family history of malignant neoplasm of digestive organs: Secondary | ICD-10-CM

## 2014-05-24 HISTORY — PX: REVERSE SHOULDER ARTHROPLASTY: SHX5054

## 2014-05-24 LAB — BASIC METABOLIC PANEL
BUN: 9 mg/dL (ref 6–23)
CHLORIDE: 101 meq/L (ref 96–112)
CO2: 26 meq/L (ref 19–32)
CREATININE: 0.65 mg/dL (ref 0.50–1.10)
Calcium: 8.5 mg/dL (ref 8.4–10.5)
GFR calc Af Amer: >90 mL/min (ref >90)
GFR calc non Af Amer: >90 mL/min (ref >90)
Glucose, Bld: 88 mg/dL (ref 70–99)
Potassium: 4.2 mEq/L (ref 3.7–5.3)
Sodium: 138 mEq/L (ref 137–147)

## 2014-05-24 LAB — CBC
HEMATOCRIT: 28.5 % — AB (ref 36.0–46.0)
Hemoglobin: 9.2 g/dL — ABNORMAL LOW (ref 12.0–15.0)
MCH: 32.9 pg (ref 26.0–34.0)
MCHC: 32.3 g/dL (ref 30.0–36.0)
MCV: 101.8 fL — AB (ref 78.0–100.0)
Platelets: 117 10*3/uL — ABNORMAL LOW (ref 150–400)
RBC: 2.8 MIL/uL — ABNORMAL LOW (ref 3.87–5.11)
RDW: 19 % — AB (ref 11.5–15.5)
WBC: 4.7 10*3/uL (ref 4.0–10.5)

## 2014-05-24 LAB — ABO/RH: ABO/RH(D): A POS

## 2014-05-24 SURGERY — ARTHROPLASTY, SHOULDER, TOTAL, REVERSE
Anesthesia: Regional | Site: Shoulder | Laterality: Left

## 2014-05-24 MED ORDER — TIOTROPIUM BROMIDE MONOHYDRATE 18 MCG IN CAPS
18.0000 ug | ORAL_CAPSULE | Freq: Every day | RESPIRATORY_TRACT | Status: DC
  Filled 2014-05-24 (×2): qty 5

## 2014-05-24 MED ORDER — LACTATED RINGERS IV SOLN
INTRAVENOUS | Status: DC | PRN
  Administered 2014-05-24 (×2): via INTRAVENOUS

## 2014-05-24 MED ORDER — PHENYLEPHRINE HCL 10 MG/ML IJ SOLN
INTRAMUSCULAR | Status: AC
  Filled 2014-05-24: qty 1

## 2014-05-24 MED ORDER — GLYCOPYRROLATE 0.2 MG/ML IJ SOLN
INTRAMUSCULAR | Status: DC | PRN
  Administered 2014-05-24: .4 mg via INTRAVENOUS

## 2014-05-24 MED ORDER — FENTANYL CITRATE 0.05 MG/ML IJ SOLN
INTRAMUSCULAR | Status: AC
  Filled 2014-05-24: qty 5

## 2014-05-24 MED ORDER — ROCURONIUM BROMIDE 100 MG/10ML IV SOLN
INTRAVENOUS | Status: DC | PRN
  Administered 2014-05-24: 50 mg via INTRAVENOUS

## 2014-05-24 MED ORDER — HYDROMORPHONE HCL PF 1 MG/ML IJ SOLN
INTRAMUSCULAR | Status: AC
  Administered 2014-05-24: 0.5 mg via INTRAVENOUS
  Filled 2014-05-24: qty 1

## 2014-05-24 MED ORDER — FENTANYL CITRATE 0.05 MG/ML IJ SOLN
INTRAMUSCULAR | Status: AC
  Administered 2014-05-24: 50 ug
  Filled 2014-05-24: qty 2

## 2014-05-24 MED ORDER — METOCLOPRAMIDE HCL 10 MG PO TABS
5.0000 mg | ORAL_TABLET | Freq: Three times a day (TID) | ORAL | Status: DC | PRN
Start: 2014-05-24 — End: 2014-05-27
  Administered 2014-05-26: 10 mg via ORAL
  Filled 2014-05-24: qty 1

## 2014-05-24 MED ORDER — PANTOPRAZOLE SODIUM 40 MG PO TBEC
80.0000 mg | DELAYED_RELEASE_TABLET | Freq: Every day | ORAL | Status: DC
  Administered 2014-05-25 – 2014-05-27 (×3): 80 mg via ORAL
  Filled 2014-05-24 (×3): qty 2

## 2014-05-24 MED ORDER — MIDAZOLAM HCL 2 MG/2ML IJ SOLN
INTRAMUSCULAR | Status: AC
  Administered 2014-05-24: 1 mg
  Filled 2014-05-24: qty 2

## 2014-05-24 MED ORDER — FENTANYL 25 MCG/HR TD PT72
25.0000 ug | MEDICATED_PATCH | TRANSDERMAL | Status: DC
  Administered 2014-05-24: 25 ug via TRANSDERMAL
  Filled 2014-05-24: qty 1

## 2014-05-24 MED ORDER — ATENOLOL 50 MG PO TABS
50.0000 mg | ORAL_TABLET | Freq: Every day | ORAL | Status: DC
  Administered 2014-05-25 – 2014-05-27 (×3): 50 mg via ORAL
  Filled 2014-05-24 (×3): qty 1

## 2014-05-24 MED ORDER — BUPIVACAINE-EPINEPHRINE (PF) 0.5% -1:200000 IJ SOLN
INTRAMUSCULAR | Status: AC
  Filled 2014-05-24: qty 30

## 2014-05-24 MED ORDER — DIPHENHYDRAMINE HCL 12.5 MG/5ML PO ELIX
12.5000 mg | ORAL_SOLUTION | ORAL | Status: DC | PRN
  Administered 2014-05-25 (×2): 25 mg via ORAL
  Filled 2014-05-24 (×2): qty 10

## 2014-05-24 MED ORDER — POLYETHYLENE GLYCOL 3350 17 G PO PACK: 17.0000 g | PACK | Freq: Every day | ORAL | Status: DC | PRN

## 2014-05-24 MED ORDER — CEFAZOLIN SODIUM 1-5 GM-% IV SOLN
1.0000 g | Freq: Four times a day (QID) | INTRAVENOUS | Status: AC
  Administered 2014-05-24 – 2014-05-25 (×3): 1 g via INTRAVENOUS
  Filled 2014-05-24 (×4): qty 50

## 2014-05-24 MED ORDER — BUPIVACAINE-EPINEPHRINE (PF) 0.25% -1:200000 IJ SOLN
INTRAMUSCULAR | Status: AC
  Filled 2014-05-24: qty 30

## 2014-05-24 MED ORDER — ALBUTEROL SULFATE (2.5 MG/3ML) 0.083% IN NEBU
2.5000 mg | INHALATION_SOLUTION | RESPIRATORY_TRACT | Status: DC | PRN
  Filled 2014-05-24: qty 3

## 2014-05-24 MED ORDER — ONDANSETRON HCL 4 MG PO TABS: 4.0000 mg | ORAL_TABLET | Freq: Four times a day (QID) | ORAL | Status: DC | PRN

## 2014-05-24 MED ORDER — PHENYLEPHRINE HCL 10 MG/ML IJ SOLN
10.0000 mg | INTRAMUSCULAR | Status: DC | PRN
  Administered 2014-05-24: 10 ug/min via INTRAVENOUS

## 2014-05-24 MED ORDER — ONDANSETRON HCL 4 MG/2ML IJ SOLN: 4.0000 mg | Freq: Four times a day (QID) | INTRAMUSCULAR | Status: DC | PRN

## 2014-05-24 MED ORDER — BUPIVACAINE-EPINEPHRINE (PF) 0.5% -1:200000 IJ SOLN
INTRAMUSCULAR | Status: DC | PRN
  Administered 2014-05-24: 30 mL

## 2014-05-24 MED ORDER — CEFAZOLIN SODIUM-DEXTROSE 2-3 GM-% IV SOLR: 2.0000 g | INTRAVENOUS | Status: DC

## 2014-05-24 MED ORDER — LIDOCAINE HCL (CARDIAC) 20 MG/ML IV SOLN
INTRAVENOUS | Status: DC | PRN
  Administered 2014-05-24: 20 mg via INTRAVENOUS

## 2014-05-24 MED ORDER — CHLORZOXAZONE 500 MG PO TABS
500.0000 mg | ORAL_TABLET | Freq: Four times a day (QID) | ORAL | Status: DC | PRN
  Administered 2014-05-25 – 2014-05-27 (×6): 500 mg via ORAL
  Filled 2014-05-24 (×8): qty 1

## 2014-05-24 MED ORDER — HYDROCODONE-ACETAMINOPHEN 5-325 MG PO TABS
1.0000 | ORAL_TABLET | ORAL | Status: DC | PRN
  Administered 2014-05-25 – 2014-05-27 (×3): 2 via ORAL
  Filled 2014-05-24 (×3): qty 2

## 2014-05-24 MED ORDER — SODIUM CHLORIDE 0.9 % IR SOLN
Status: DC | PRN
  Administered 2014-05-24: 1000 mL

## 2014-05-24 MED ORDER — HYDROMORPHONE HCL PF 1 MG/ML IJ SOLN
0.2500 mg | INTRAMUSCULAR | Status: DC | PRN
  Administered 2014-05-24 (×2): 0.5 mg via INTRAVENOUS
  Administered 2014-05-24: 0.25 mg via INTRAVENOUS

## 2014-05-24 MED ORDER — HYDROMORPHONE HCL PF 1 MG/ML IJ SOLN
INTRAMUSCULAR | Status: AC
  Filled 2014-05-24: qty 1

## 2014-05-24 MED ORDER — FENTANYL CITRATE 0.05 MG/ML IJ SOLN
INTRAMUSCULAR | Status: DC | PRN
Start: 2014-05-24 — End: 2014-05-24
  Administered 2014-05-24: 100 ug via INTRAVENOUS

## 2014-05-24 MED ORDER — OXYCODONE HCL 5 MG PO TABS
5.0000 mg | ORAL_TABLET | Freq: Once | ORAL | Status: AC | PRN
  Administered 2014-05-24: 5 mg via ORAL

## 2014-05-24 MED ORDER — PHENYLEPHRINE 40 MCG/ML (10ML) SYRINGE FOR IV PUSH (FOR BLOOD PRESSURE SUPPORT)
PREFILLED_SYRINGE | INTRAVENOUS | Status: AC
  Filled 2014-05-24: qty 10

## 2014-05-24 MED ORDER — PHENOL 1.4 % MT LIQD: 1.0000 | OROMUCOSAL | Status: DC | PRN

## 2014-05-24 MED ORDER — MIDAZOLAM HCL 2 MG/2ML IJ SOLN
INTRAMUSCULAR | Status: AC
  Filled 2014-05-24: qty 2

## 2014-05-24 MED ORDER — OXYCODONE HCL 5 MG PO TABS
ORAL_TABLET | ORAL | Status: AC
  Filled 2014-05-24: qty 1

## 2014-05-24 MED ORDER — PHENYLEPHRINE HCL 10 MG/ML IJ SOLN
INTRAMUSCULAR | Status: DC | PRN
  Administered 2014-05-24 (×4): 80 ug via INTRAVENOUS

## 2014-05-24 MED ORDER — ALBUTEROL SULFATE (2.5 MG/3ML) 0.083% IN NEBU
3.0000 mL | INHALATION_SOLUTION | Freq: Four times a day (QID) | RESPIRATORY_TRACT | Status: DC | PRN
  Administered 2014-05-25 – 2014-05-27 (×3): 3 mL via RESPIRATORY_TRACT
  Filled 2014-05-24 (×3): qty 3

## 2014-05-24 MED ORDER — ACETAMINOPHEN 325 MG PO TABS: 650.0000 mg | ORAL_TABLET | Freq: Four times a day (QID) | ORAL | Status: DC | PRN

## 2014-05-24 MED ORDER — MORPHINE SULFATE 2 MG/ML IJ SOLN
1.0000 mg | INTRAMUSCULAR | Status: DC | PRN
  Administered 2014-05-24 – 2014-05-25 (×4): 1 mg via INTRAVENOUS
  Filled 2014-05-24 (×4): qty 1

## 2014-05-24 MED ORDER — ACETAMINOPHEN 650 MG RE SUPP
650.0000 mg | Freq: Four times a day (QID) | RECTAL | Status: DC | PRN
Start: 2014-05-24 — End: 2014-05-27

## 2014-05-24 MED ORDER — DOCUSATE SODIUM 100 MG PO CAPS
100.0000 mg | ORAL_CAPSULE | Freq: Two times a day (BID) | ORAL | Status: DC
  Administered 2014-05-24 – 2014-05-27 (×6): 100 mg via ORAL
  Filled 2014-05-24 (×7): qty 1

## 2014-05-24 MED ORDER — LACTATED RINGERS IV SOLN
INTRAVENOUS | Status: DC
  Administered 2014-05-24: 13:00:00 via INTRAVENOUS

## 2014-05-24 MED ORDER — PROMETHAZINE HCL 25 MG PO TABS: 25.0000 mg | ORAL_TABLET | Freq: Four times a day (QID) | ORAL | Status: DC | PRN

## 2014-05-24 MED ORDER — FLEET ENEMA 7-19 GM/118ML RE ENEM: 1.0000 | ENEMA | Freq: Once | RECTAL | Status: AC | PRN

## 2014-05-24 MED ORDER — METOCLOPRAMIDE HCL 5 MG/ML IJ SOLN
5.0000 mg | Freq: Three times a day (TID) | INTRAMUSCULAR | Status: DC | PRN
Start: 2014-05-24 — End: 2014-05-27

## 2014-05-24 MED ORDER — SODIUM CHLORIDE 0.9 % IR SOLN
Status: DC | PRN
  Administered 2014-05-24: 3000 mL

## 2014-05-24 MED ORDER — OXYCODONE HCL 5 MG/5ML PO SOLN
5.0000 mg | Freq: Once | ORAL | Status: AC | PRN
Start: 2014-05-24 — End: 2014-05-24

## 2014-05-24 MED ORDER — ONDANSETRON HCL 4 MG/2ML IJ SOLN
INTRAMUSCULAR | Status: DC | PRN
  Administered 2014-05-24: 4 mg via INTRAVENOUS

## 2014-05-24 MED ORDER — CHLORHEXIDINE GLUCONATE 4 % EX LIQD
60.0000 mL | Freq: Once | CUTANEOUS | Status: DC
  Filled 2014-05-24: qty 60

## 2014-05-24 MED ORDER — SODIUM CHLORIDE 0.9 % IV SOLN: INTRAVENOUS | Status: DC

## 2014-05-24 MED ORDER — OXYCODONE HCL 5 MG PO TABS
5.0000 mg | ORAL_TABLET | ORAL | Status: DC | PRN
  Administered 2014-05-25 (×2): 10 mg via ORAL
  Administered 2014-05-26: 5 mg via ORAL
  Administered 2014-05-26 – 2014-05-27 (×5): 10 mg via ORAL
  Filled 2014-05-24 (×6): qty 2
  Filled 2014-05-24: qty 1
  Filled 2014-05-24: qty 2

## 2014-05-24 MED ORDER — PROPOFOL 10 MG/ML IV BOLUS
INTRAVENOUS | Status: DC | PRN
  Administered 2014-05-24: 100 mg via INTRAVENOUS

## 2014-05-24 MED ORDER — SERTRALINE HCL 100 MG PO TABS
200.0000 mg | ORAL_TABLET | Freq: Every day | ORAL | Status: DC
  Administered 2014-05-25 – 2014-05-27 (×3): 200 mg via ORAL
  Filled 2014-05-24 (×3): qty 2

## 2014-05-24 MED ORDER — OXYCODONE-ACETAMINOPHEN 5-325 MG PO TABS
1.0000 | ORAL_TABLET | ORAL | Status: DC | PRN
  Administered 2014-05-25 – 2014-05-27 (×11): 2 via ORAL
  Filled 2014-05-24 (×12): qty 2

## 2014-05-24 MED ORDER — ONDANSETRON 4 MG PO TBDP: 4.0000 mg | ORAL_TABLET | Freq: Three times a day (TID) | ORAL | Status: DC | PRN

## 2014-05-24 MED ORDER — MENTHOL 3 MG MT LOZG: 1.0000 | LOZENGE | OROMUCOSAL | Status: DC | PRN

## 2014-05-24 MED ORDER — BISACODYL 10 MG RE SUPP: 10.0000 mg | Freq: Every day | RECTAL | Status: DC | PRN

## 2014-05-24 MED ORDER — ALUMINUM HYDROXIDE GEL 320 MG/5ML PO SUSP
15.0000 mL | ORAL | Status: DC | PRN
  Administered 2014-05-27: 30 mL via ORAL
  Filled 2014-05-24 (×3): qty 30

## 2014-05-24 MED ORDER — TRAZODONE HCL 150 MG PO TABS
300.0000 mg | ORAL_TABLET | Freq: Every day | ORAL | Status: DC
  Administered 2014-05-24 – 2014-05-26 (×3): 300 mg via ORAL
  Filled 2014-05-24 (×4): qty 2

## 2014-05-24 MED ORDER — ALPRAZOLAM 0.5 MG PO TABS
1.0000 mg | ORAL_TABLET | Freq: Four times a day (QID) | ORAL | Status: DC | PRN
  Administered 2014-05-24 – 2014-05-27 (×9): 1 mg via ORAL
  Filled 2014-05-24 (×9): qty 2

## 2014-05-24 MED ORDER — DM-GUAIFENESIN ER 30-600 MG PO TB12
1.0000 | ORAL_TABLET | Freq: Two times a day (BID) | ORAL | Status: DC
  Administered 2014-05-24 – 2014-05-27 (×6): 1 via ORAL
  Filled 2014-05-24 (×8): qty 1

## 2014-05-24 MED ORDER — NEOSTIGMINE METHYLSULFATE 10 MG/10ML IV SOLN
INTRAVENOUS | Status: DC | PRN
  Administered 2014-05-24: 3 mg via INTRAVENOUS

## 2014-05-24 SURGICAL SUPPLY — 73 items
BLADE SAW SAG 73X25 THK (BLADE) ×1
BLADE SAW SGTL 73X25 THK (BLADE) ×1 IMPLANT
BLADE SURG 15 STRL LF DISP TIS (BLADE) ×1 IMPLANT
BLADE SURG 15 STRL SS (BLADE) ×2
BOWL SMART MIX CTS (DISPOSABLE) ×1 IMPLANT
CEMENT BONE DEPUY (Cement) ×1 IMPLANT
CHLORAPREP W/TINT 26ML (MISCELLANEOUS) ×3 IMPLANT
COVER SURGICAL LIGHT HANDLE (MISCELLANEOUS) ×2 IMPLANT
DRAPE INCISE IOBAN 66X45 STRL (DRAPES) ×4 IMPLANT
DRAPE SURG 17X23 STRL (DRAPES) ×2 IMPLANT
DRAPE U-SHAPE 47X51 STRL (DRAPES) ×2 IMPLANT
DRILL BIT 5/64 (BIT) ×2 IMPLANT
DRSG MEPILEX BORDER 4X8 (GAUZE/BANDAGES/DRESSINGS) ×1 IMPLANT
ELECT BLADE 4.0 EZ CLEAN MEGAD (MISCELLANEOUS) ×2
ELECT REM PT RETURN 9FT ADLT (ELECTROSURGICAL) ×2
ELECTRODE BLDE 4.0 EZ CLN MEGD (MISCELLANEOUS) ×1 IMPLANT
ELECTRODE REM PT RTRN 9FT ADLT (ELECTROSURGICAL) ×1 IMPLANT
EPIPHYSIS BODY POROCOAT SZ10 (Orthopedic Implant) ×1 IMPLANT
EPIPHYSIS SHOULD BODYSIZE 10-5 (Knees) ×1 IMPLANT
EVACUATOR 1/8 PVC DRAIN (DRAIN) ×2 IMPLANT
GLOVE BIO SURGEON STRL SZ 6.5 (GLOVE) ×1 IMPLANT
GLOVE BIO SURGEON STRL SZ7 (GLOVE) ×2 IMPLANT
GLOVE BIO SURGEON STRL SZ7.5 (GLOVE) ×2 IMPLANT
GLOVE BIOGEL PI IND STRL 6.5 (GLOVE) IMPLANT
GLOVE BIOGEL PI IND STRL 7.0 (GLOVE) ×1 IMPLANT
GLOVE BIOGEL PI IND STRL 8 (GLOVE) ×1 IMPLANT
GLOVE BIOGEL PI INDICATOR 6.5 (GLOVE) ×1
GLOVE BIOGEL PI INDICATOR 7.0 (GLOVE) ×2
GLOVE BIOGEL PI INDICATOR 8 (GLOVE) ×1
GOWN STRL REUS W/ TWL LRG LVL3 (GOWN DISPOSABLE) ×3 IMPLANT
GOWN STRL REUS W/ TWL XL LVL3 (GOWN DISPOSABLE) ×1 IMPLANT
GOWN STRL REUS W/TWL LRG LVL3 (GOWN DISPOSABLE) ×6
GOWN STRL REUS W/TWL XL LVL3 (GOWN DISPOSABLE) ×2
HANDPIECE INTERPULSE COAX TIP (DISPOSABLE) ×2
HOOD PEEL AWAY FACE SHEILD DIS (HOOD) ×4 IMPLANT
KIT BASIN OR (CUSTOM PROCEDURE TRAY) ×2 IMPLANT
KIT ROOM TURNOVER OR (KITS) ×2 IMPLANT
MANIFOLD NEPTUNE II (INSTRUMENTS) ×2 IMPLANT
NDL HYPO 25GX1X1/2 BEV (NEEDLE) IMPLANT
NDL MAYO TROCAR (NEEDLE) IMPLANT
NEEDLE HYPO 25GX1X1/2 BEV (NEEDLE) IMPLANT
NEEDLE MAYO TROCAR (NEEDLE) IMPLANT
NOZZLE PRISM 8.5MM (MISCELLANEOUS) IMPLANT
NS IRRIG 1000ML POUR BTL (IV SOLUTION) ×2 IMPLANT
PACK SHOULDER (CUSTOM PROCEDURE TRAY) ×2 IMPLANT
PAD ARMBOARD 7.5X6 YLW CONV (MISCELLANEOUS) ×4 IMPLANT
RETRIEVER SUT HEWSON (MISCELLANEOUS) ×2 IMPLANT
SET HNDPC FAN SPRY TIP SCT (DISPOSABLE) ×1 IMPLANT
SLING ARM IMMOBILIZER LRG (SOFTGOODS) ×2 IMPLANT
SLING ARM IMMOBILIZER MED (SOFTGOODS) IMPLANT
SPONGE LAP 18X18 X RAY DECT (DISPOSABLE) ×2 IMPLANT
SPONGE LAP 4X18 X RAY DECT (DISPOSABLE) IMPLANT
STEM STANDARD SZ 10 113MM (Stem) ×2 IMPLANT
STEM STD SZ 10 113MM (Stem) IMPLANT
STRIP CLOSURE SKIN 1/2X4 (GAUZE/BANDAGES/DRESSINGS) ×2 IMPLANT
SUCTION FRAZIER TIP 10 FR DISP (SUCTIONS) ×2 IMPLANT
SUPPORT WRAP ARM LG (MISCELLANEOUS) ×2 IMPLANT
SUT ETHIBOND 2 OS 4 DA (SUTURE) ×4 IMPLANT
SUT ETHIBOND NAB CT1 #1 30IN (SUTURE) ×1 IMPLANT
SUT FIBERWIRE #2 38 T-5 BLUE (SUTURE) ×12
SUT MNCRL AB 4-0 PS2 18 (SUTURE) ×2 IMPLANT
SUT SILK 2 0 TIES 17X18 (SUTURE)
SUT SILK 2-0 18XBRD TIE BLK (SUTURE) IMPLANT
SUT VIC AB 0 CTB1 27 (SUTURE) ×2 IMPLANT
SUT VIC AB 2-0 CT1 27 (SUTURE) ×2
SUT VIC AB 2-0 CT1 TAPERPNT 27 (SUTURE) ×1 IMPLANT
SUTURE FIBERWR #2 38 T-5 BLUE (SUTURE) IMPLANT
SYR CONTROL 10ML LL (SYRINGE) IMPLANT
SYRINGE TOOMEY DISP (SYRINGE) IMPLANT
TOWEL OR 17X24 6PK STRL BLUE (TOWEL DISPOSABLE) ×2 IMPLANT
TOWEL OR 17X26 10 PK STRL BLUE (TOWEL DISPOSABLE) ×2 IMPLANT
WATER STERILE IRR 1000ML POUR (IV SOLUTION) ×2 IMPLANT
YANKAUER SUCT BULB TIP NO VENT (SUCTIONS) ×2 IMPLANT

## 2014-05-24 NOTE — Anesthesia Preprocedure Evaluation (Addendum)
Anesthesia Evaluation  Patient identified by MRN, date of birth, ID band Patient awake    Reviewed: Allergy & Precautions, H&P , NPO status , Patient's Chart, lab work & pertinent test results, reviewed documented beta blocker date and time   Airway Mallampati: II  Neck ROM: full    Dental   Pulmonary shortness of breath and Long-Term Oxygen Therapy, COPD oxygen dependent, Current Smoker,  Lung cancer: chemo         Cardiovascular hypertension, Pt. on medications and Pt. on home beta blockers     Neuro/Psych Anxiety Depression CVA, Residual Symptoms    GI/Hepatic Neg liver ROS, GERD-  Medicated and Controlled,  Endo/Other  negative endocrine ROS  Renal/GU negative Renal ROS     Musculoskeletal   Abdominal   Peds  Hematology  (+) Blood dyscrasia (Hb 9.2), anemia ,   Anesthesia Other Findings   Reproductive/Obstetrics                          Anesthesia Physical Anesthesia Plan  ASA: III  Anesthesia Plan: General and Regional   Post-op Pain Management: MAC Combined w/ Regional for Post-op pain   Induction: Intravenous  Airway Management Planned: Oral ETT  Additional Equipment:   Intra-op Plan:   Post-operative Plan: Extubation in OR  Informed Consent: I have reviewed the patients History and Physical, chart, labs and discussed the procedure including the risks, benefits and alternatives for the proposed anesthesia with the patient or authorized representative who has indicated his/her understanding and acceptance.     Plan Discussed with: CRNA, Anesthesiologist and Surgeon  Anesthesia Plan Comments:         Anesthesia Quick Evaluation

## 2014-05-24 NOTE — Transfer of Care (Signed)
Immediate Anesthesia Transfer of Care Note  Patient: [name]  Procedure(s) Performed: Procedure(s):   Hemi-Arthroplasty  Left Shoulder (Left)  Patient Location: PACU  Anesthesia Type:General  Level of Consciousness: awake, alert , oriented and patient cooperative  Airway & Oxygen Therapy: Patient Spontanous Breathing and Patient connected to nasal cannula oxygen  Post-op Assessment: Report given to PACU RN and Post -op Vital signs reviewed and stable  Post vital signs: Reviewed and stable  Complications: No apparent anesthesia complications

## 2014-05-24 NOTE — Anesthesia Procedure Notes (Addendum)
Anesthesia Regional Block:  Interscalene brachial plexus block  Pre-Anesthetic Checklist: ,, timeout performed, Correct Patient, Correct Site, Correct Laterality, Correct Procedure, Correct Position, site marked, Risks and benefits discussed,  Surgical consent,  Pre-op evaluation,  At surgeon's request and post-op pain management  Laterality: Left  Prep: chloraprep       Needles:  Injection technique: Single-shot  Needle Type: Echogenic Stimulator Needle     Needle Length: 5cm 5 cm Needle Gauge: 22 and 22 G    Additional Needles:  Procedures: ultrasound guided (picture in chart) and nerve stimulator Interscalene brachial plexus block  Nerve Stimulator or Paresthesia:  Response: biceps flexion, 0.45 mA,   Additional Responses:   Narrative:  Start time: 05/24/2014 1:12 PM End time: 05/24/2014 1:26 PM Injection made incrementally with aspirations every 5 mL.  Performed by: Personally  Anesthesiologist: Dr Marcie Bal  Additional Notes: Functioning IV was confirmed and monitors were applied.  A 14mm 22ga Arrow echogenic stimulator needle was used. Sterile prep and drape,hand hygiene and sterile gloves were used.  Negative aspiration and negative test dose prior to incremental administration of local anesthetic. The patient tolerated the procedure well.  Ultrasound guidance: relevent anatomy identified, needle position confirmed, local anesthetic spread visualized around nerve(s), vascular puncture avoided.  Image printed for medical record.    Procedure Name: Intubation Date/Time: 05/24/2014 3:48 PM Performed by: Ned Grace Pre-anesthesia Checklist: Patient identified, Patient being monitored, Emergency Drugs available, Timeout performed and Suction available Patient Re-evaluated:Patient Re-evaluated prior to inductionOxygen Delivery Method: Circle system utilized Preoxygenation: Pre-oxygenation with 100% oxygen Intubation Type: IV induction Ventilation: Mask  ventilation without difficulty Laryngoscope Size: Mac and 3 Grade View: Grade I Tube type: Oral Tube size: 7.5 mm Number of attempts: 1 Airway Equipment and Method: Stylet Placement Confirmation: ETT inserted through vocal cords under direct vision,  breath sounds checked- equal and bilateral and positive ETCO2 Secured at: 23 cm Tube secured with: Tape Dental Injury: Teeth and Oropharynx as per pre-operative assessment

## 2014-05-24 NOTE — Anesthesia Postprocedure Evaluation (Signed)
  Anesthesia Post-op Note  Patient: [name]  Procedure(s) Performed: Procedure(s):   Hemi-Arthroplasty  Left Shoulder (Left)  Patient Location: PACU  Anesthesia Type:General  Level of Consciousness: awake and alert   Airway and Oxygen Therapy: Patient Spontanous Breathing  Post-op Pain: mild  Post-op Assessment: Post-op Vital signs reviewed  Post-op Vital Signs: stable  Last Vitals:  Filed Vitals:   05/24/14 1945  BP: 96/50  Pulse: 72  Temp:   Resp: 14    Complications: No apparent anesthesia complications

## 2014-05-24 NOTE — H&P (Signed)
[name] is an 68 y.o. female.   Chief Complaint: L shoulder pain and dysfunction HPI: 68 yo female with terminal lung cancer with L displaced proximal humerus fracture.  Past Medical History  Diagnosis Date  . H/O: stroke 2012, 2000,     X3  . Hypertension   . Anxiety   . PTSD (post-traumatic stress disorder)   . Depression   . SVD (spontaneous vaginal delivery)     x 1  . Smoker   . Osteoarthritis     knees, hips hands,   . Shortness of breath   . Pain     SEVERE PAIN BACK, RIGHT HIP AND KNEES--HARRINGTON RODS AND CERVICAL PLATES--USES WALKER OR CANE WHEN AMBULATING - GOES TO A PAIN CLINIC-STATES SHE NEEDS RT HIP AND BOTH KNEES REPLACED. PT STATES SHE WAS TOLD THE CEMENT AROUND THE HARRINGTON RODS IS CRACKED.  [name] Hot flashes, menopausal     SEVERE  . Complication of anesthesia 03/02/13    severe headache,vertigo postop  . COPD (chronic obstructive pulmonary disease)     stable  . 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 Lung cancer   . Status post radiation therapy within four to twelve weeks 10/18/13-12/06/13    lung ca  66Gy  . Family history of anesthesia complication     equipment failure caused problem, suffered a stroke & heartattack    Past Surgical History  Procedure Laterality Date  . Carpal tunnel release      BILATERAL  . Nasal sinus surgery    . Lumbar fusion      L4-S1  . Cervical fusion    . Hernia repair    . Salpingoophorectomy Right   . Vaginal hysterectomy      TAH w/ ovary removal  . Transthoracic echocardiogram  12-03-2011    LVSF NORMAL/ EF 60-65%  . Vulvectomy N/A 03/02/2013    Procedure: WIDE LOCAL EXCISION VULVAR;  Surgeon: Imagene Gurney A. Alycia Rossetti, MD;  Location: [facility];  Service: Gynecology;  Laterality: N/A;  . Co2 laser application N/A 1/54/0086    Procedure: LASER APPLICATION OF THE VULVA;  Surgeon: Janie Morning, MD;  Location: [facility];  Service: Gynecology;  Laterality: N/A;  . Video bronchoscopy Bilateral 09/23/2013   Procedure: VIDEO BRONCHOSCOPY WITHOUT FLUORO;  Surgeon: Tanda Rockers, MD;  Location: [facility];  Service: Cardiopulmonary;  Laterality: Bilateral;    Family History  Problem Relation Age of Onset  . Cancer Mother     COLON  . Heart disease Father   . Diabetes Maternal Aunt   . Cancer Maternal Grandfather     KIDNEY   . Diabetes Maternal Grandfather   . Hypertension Maternal Grandfather   . Heart disease Maternal Grandfather    Social History:  reports that she has been smoking Cigarettes.  She has a 17.5 pack-year smoking history. She has never used smokeless tobacco. She reports that she does not drink alcohol or use illicit drugs.  Allergies:  Allergies  Allergen Reactions  . Carafate [Sucralfate] Nausea Only  . Prednisone     insomnia  . Nsaids     GI Upset  . Aspirin Other (See Comments)    Gi symptoms. Does NOT take ibuprofen or other NSAIDS  . Ibuprofen     Gi upset  . Dulera [Mometasone Furo-Formoterol Fum] Palpitations    Panting " my body was twisted inside and out"    Medications Prior to Admission  Medication Sig Dispense Refill  .  Aclidinium Bromide (TUDORZA PRESSAIR) 400 MCG/ACT AEPB Inhale 1 puff into the lungs 2 (two) times daily.      [name] albuterol (PROVENTIL) (2.5 MG/3ML) 0.083% nebulizer solution Take 2.5 mg by nebulization as needed for wheezing.      [name] albuterol (VENTOLIN HFA) 108 (90 BASE) MCG/ACT inhaler Inhale 2 puffs into the lungs every 6 (six) hours as needed for wheezing or shortness of breath.  1 Inhaler  11  . ALPRAZolam (XANAX) 1 MG tablet Take 1 tablet (1 mg total) by mouth every 6 (six) hours as needed for anxiety.  30 tablet  0  . atenolol (TENORMIN) 50 MG tablet Take 1 tablet (50 mg total) by mouth daily.  30 tablet  0  . chlorzoxazone (PARAFON) 500 MG tablet Take 500 mg by mouth 4 (four) times daily as needed for muscle spasms.      [name] dextromethorphan-guaiFENesin (MUCINEX DM) 30-600 MG per 12 hr tablet Take 1 tablet by mouth every 12  (twelve) hours.      [name] esomeprazole (NEXIUM) 40 MG capsule Take 40 mg by mouth daily as needed.       . fentaNYL (DURAGESIC - DOSED MCG/HR) 25 MCG/HR patch Place 25 mcg onto the skin every 3 (three) days.      . ondansetron (ZOFRAN ODT) 4 MG disintegrating tablet Take 1 tablet (4 mg total) by mouth every 8 (eight) hours as needed for nausea or vomiting.  20 tablet  0  . oxyCODONE-acetaminophen (PERCOCET) 10-325 MG per tablet Take 1 tablet by mouth every 6 (six) hours as needed for pain.  60 tablet  0  . promethazine (PHENERGAN) 25 MG tablet Take 25 mg by mouth every 6 (six) hours as needed for nausea or vomiting.      . sertraline (ZOLOFT) 100 MG tablet Take 200 mg by mouth daily.       . trazodone (DESYREL) 300 MG tablet Take 300 mg by mouth at bedtime.        Results for orders placed during the hospital encounter of 05/24/14 (from the past 48 hour(s))  BASIC METABOLIC PANEL     Status: None   Collection Time    05/24/14 12:31 PM      Result Value Ref Range   Sodium 138  137 - 147 mEq/L   Potassium 4.2  3.7 - 5.3 mEq/L   Chloride 101  96 - 112 mEq/L   CO2 26  19 - 32 mEq/L   Glucose, Bld 88  70 - 99 mg/dL   BUN 9  6 - 23 mg/dL   Creatinine, Ser 0.65  0.50 - 1.10 mg/dL   Calcium 8.5  8.4 - 10.5 mg/dL   GFR calc non Af Amer >90  >90 mL/min   GFR calc Af Amer >90  >90 mL/min   Comment: (NOTE)     The eGFR has been calculated using the CKD EPI equation.     This calculation has not been validated in all clinical situations.     eGFR's persistently <90 mL/min signify possible Chronic Kidney     Disease.  CBC     Status: Abnormal   Collection Time    05/24/14 12:31 PM      Result Value Ref Range   WBC 4.7  4.0 - 10.5 K/uL   RBC 2.80 (*) 3.87 - 5.11 MIL/uL   Hemoglobin 9.2 (*) 12.0 - 15.0 g/dL   Comment: REPEATED TO VERIFY   HCT 28.5 (*) 36.0 - 46.0 %  MCV 101.8 (*) 78.0 - 100.0 fL   Comment: REPEATED TO VERIFY   MCH 32.9  26.0 - 34.0 pg   MCHC 32.3  30.0 - 36.0 g/dL   RDW 19.0  (*) 11.5 - 15.5 %   Platelets 117 (*) 150 - 400 K/uL   Comment: PLATELET COUNT CONFIRMED BY SMEAR  TYPE AND SCREEN     Status: None   Collection Time    05/24/14 12:35 PM      Result Value Ref Range   ABO/RH(D) A POS     Antibody Screen NEG     Sample Expiration 05/27/2014    ABO/RH     Status: None   Collection Time    05/24/14 12:35 PM      Result Value Ref Range   ABO/RH(D) A POS     No results found.  Review of Systems  Respiratory: Positive for cough and shortness of breath.   Neurological: Positive for weakness.  All other systems reviewed and are negative.   Blood pressure 127/70, pulse 84, temperature 97.9 F (36.6 C), temperature source Oral, resp. rate 27, height 5' 6"  (1.676 m), weight 77.111 kg (170 lb), SpO2 99.00%. Physical Exam  Constitutional: She is oriented to person, place, and time. She appears well-developed.  HENT:  Head: Atraumatic.  Eyes: EOM are normal.  Cardiovascular: Intact distal pulses.   Musculoskeletal:  L shoulder pain with any movement.  Skin intact, mild swelling. Distally NVI.  Neurological: She is alert and oriented to person, place, and time.  Skin: Skin is warm and dry.  Psychiatric: She has a normal mood and affect.     Assessment/Plan L displaced proximal humerus fracture Plan ORIF vs reverse TSA given poor bone quality Risks / benefits of surgery discussed Consent on chart  NPO for OR Preop antibiotics   Nita Sells 05/24/2014, 2:57 PM

## 2014-05-24 NOTE — Op Note (Signed)
Procedure(s):   Hemi-Arthroplasty  Left Shoulder Procedure Note  [name] female 68 y.o. 05/24/2014  Procedure(s) and Anesthesia Type:    *   Hemi-Arthroplasty for fracture Left Shoulder - General  Surgeon(s) and Role:    * Nita Sells, MD - Primary   Indications:  68 y.o. female  With displaced comminuted left proximal humerus fracture. Indicated for hemiarthroplasty to prevent ongoing pain, malunion, nonunion. She understood risk benefits alternatives to the procedure was to go forward with surgery.     Surgeon: Nita Sells   Assistants: Jeanmarie Hubert PA-C [facility] was present and scrubbed throughout the procedure and was essential in positioning, retraction, exposure, and closure)  Anesthesia: General endotracheal anesthesia with interscalene block given by the attending anesthesiologist    Procedure Detail    Hemi-Arthroplasty  Left Shoulder  Findings: DePuy cemented unite size 10 stem with a 48 x 18 centered head. Tuberosities were  repaired around the implant.  Estimated Blood Loss:  200 mL         Drains: 1 medium hemovac  Blood Given: none          Specimens: none        Complications:  * No complications entered in OR log *         Disposition: PACU - hemodynamically stable.         Condition: stable    Procedure:   The patient was identified in the preoperative holding area where I personally marked the operative extremity after verifying with the patient and consent. She  was taken to the operating room where She was transferred to the   operative table.  The patient received an interscalene block in   the holding area by the attending anesthesiologist.  General anesthesia was induced   in the operating room without complication.  The patient did receive IV  Ancef prior to the commencement of the procedure.  The patient was   placed in the beach-chair position with the back raised about 30   degrees.  The  nonoperative extremity and head and neck were carefully   positioned and padded protecting against neurovascular compromise.  The   left upper extremity was then prepped and draped in the standard sterile   fashion.    The appropriate operative time-out was performed with   Anesthesia, the perioperative staff, as well as myself and we all agreed   that the left side was the correct operative site.  An approximately   10 cm incision was made from the tip of the coracoid to the center point of the   humerus at the level of the axilla.  Dissection was carried down sharply   through subcutaneous tissues and cephalic vein was identified and taken   laterally with the deltoid.  The pectoralis major was taken medially.  The   upper 1 cm of the pectoralis major was released from its attachment on   the humerus.  The clavipectoral fascia was incised just lateral to the   conjoined tendon.  This incision was carried up to but not into the   coracoacromial ligament.  Digital palpation was used to prove   integrity of the axillary nerve which was protected throughout the   procedure.  Musculocutaneous nerve was not palpated in the operative   field.  Conjoined tendon was then retracted gently medially and the   deltoid laterally.  Anterior circumflex humeral vessels were clamped and   coagulated.  The soft tissues  overlying the biceps was incised and this   incision was carried across the transverse humeral ligament to the base   of the coracoid. Through the lesser tuberosity was completed with a large osteotome. The fracture that the humeral neck was developed with Cobb elevators. The head was presented and a saw was used to complete the osteotomy of the head fragment. The greater tuberosity was left in place attached to the superior and posterior rotator cuff. The tuberosities were tagged. Fiber wire sutures were passed through the bone tendon junction of the greater tuberosity high and low for later  repair. The proximal humeral shaft was presented into the wound and prepared with 68 and 10 reamers. A size 10 was felt to be appropriate. A size 10 trial was placed. The glenoid was examined and found to be intact. There is a large inferior labral tear which was excised. The 48 x 18 head size was felt to most closely represents the bone removed and was placed on the trial., Reduction was felt to be appropriate. The-the implant was noted and the proximal shaft was then prepared for cementation and the final implant was cemented into place with 30 of retroversion. The 48 x 18 centered head was then impacted in the joint was reduced. Tuberosities were reduced around the implant and repaired with 6 #2 FiberWire is including to the placed through drill holes the proximal shaft to create a vertical tension band construct. Copious irrigation was used. Bone graft from the humeral head was placed beneath tuberosities prior to closure.   Skin was closed with 2-0 Vicryl sutures in the deep dermal layer and 4-0 Monocryl in a subcuticular  running fashion.  Sterile dressings were then applied including Steri- Strips, 4x4s, ABDs and tape.  The patient was placed in a sling and allowed to awaken from general anesthesia and taken to the recovery room in stable  condition.      POSTOPERATIVE PLAN:    She will remain in a sling with hand wrist and elbow motion only. The patient will likely be kept in the hospital for 1-2 days and then discharged home.

## 2014-05-24 NOTE — Progress Notes (Signed)
Call to Dr. Tamera Punt, requested that orders be entered into EPIC.

## 2014-05-25 ENCOUNTER — Other Ambulatory Visit: Payer: Medicare Other

## 2014-05-25 ENCOUNTER — Other Ambulatory Visit (HOSPITAL_COMMUNITY): Payer: Medicare Other

## 2014-05-25 HISTORY — PX: HEMIARTHROPLASTY SHOULDER FRACTURE: SUR653

## 2014-05-25 LAB — BASIC METABOLIC PANEL
BUN: 7 mg/dL (ref 6–23)
CHLORIDE: 99 meq/L (ref 96–112)
CO2: 29 mEq/L (ref 19–32)
Calcium: 8 mg/dL — ABNORMAL LOW (ref 8.4–10.5)
Creatinine, Ser: 0.64 mg/dL (ref 0.50–1.10)
GFR calc Af Amer: >90 mL/min (ref >90)
GFR calc non Af Amer: >90 mL/min (ref >90)
GLUCOSE: 116 mg/dL — AB (ref 70–99)
POTASSIUM: 4 meq/L (ref 3.7–5.3)
Sodium: 138 mEq/L (ref 137–147)

## 2014-05-25 LAB — CBC
HEMATOCRIT: 24.9 % — AB (ref 36.0–46.0)
HEMOGLOBIN: 8.1 g/dL — AB (ref 12.0–15.0)
MCH: 33.5 pg (ref 26.0–34.0)
MCHC: 32.5 g/dL (ref 30.0–36.0)
MCV: 102.9 fL — AB (ref 78.0–100.0)
Platelets: 128 10*3/uL — ABNORMAL LOW (ref 150–400)
RBC: 2.42 MIL/uL — ABNORMAL LOW (ref 3.87–5.11)
RDW: 19 % — ABNORMAL HIGH (ref 11.5–15.5)
WBC: 5.2 10*3/uL (ref 4.0–10.5)

## 2014-05-25 LAB — MRSA PCR SCREENING: MRSA by PCR: NEGATIVE

## 2014-05-25 MED ORDER — OXYCODONE-ACETAMINOPHEN 10-325 MG PO TABS: 1.0000 | ORAL_TABLET | ORAL | Status: DC | PRN

## 2014-05-25 NOTE — Progress Notes (Signed)
   PATIENT ID: [name]   1 Day Post-Op Procedure(s) (LRB):   Hemi-Arthroplasty  Left Shoulder (Left)  Subjective: Pain moderate after block wore off. Breathing well.   Objective:  Filed Vitals:   05/25/14 0533  BP: 106/60  Pulse: 82  Temp: 98.6 F (37 C)  Resp: 20     L shoulder dressing with sml spotting. NVID.  Labs:   Recent Labs  05/24/14 1231 05/25/14 0520  HGB 9.2* 8.1*   Recent Labs  05/24/14 1231 05/25/14 0520  WBC 4.7 5.2  RBC 2.80* 2.42*  HCT 28.5* 24.9*  PLT 117* 128*   Recent Labs  05/24/14 1231 05/25/14 0520  NA 138 138  K 4.2 4.0  CL 101 99  CO2 26 29  BUN 9 7  CREATININE 0.65 0.64  GLUCOSE 88 116*  CALCIUM 8.5 8.0*    Assessment and Plan:POD1  Up with PT / OT today Pain control ABLA: observe recheck in am  VTE proph: SCDs, ambulation

## 2014-05-25 NOTE — Progress Notes (Signed)
OT Cancellation Note  Patient Details Name: [name] MRN: [id number] DOB: 18-Mar-1946   Cancelled Treatment:    Reason Eval/Treat Not Completed: Fatigue/lethargy limiting ability to participate. Will continue to follow.  [name] 05/25/2014, 9:15 AM [phone]

## 2014-05-25 NOTE — Progress Notes (Signed)
Utilization review completed.  

## 2014-05-25 NOTE — Progress Notes (Signed)
OT Cancellation Note  Patient Details Name: [name] MRN: [id number] DOB: Mar 09, 1946   Cancelled Treatment:    Reason Eval/Treat Not Completed: Pain limiting ability to participate. Premedicated, but declining EOB or repositioning. Educated pt on benefits of OOB activity, pt voiced understanding.  Will continue to follow.  Haze Boyden Todd Argabright 05/25/2014, 1:56 PM [phone]

## 2014-05-25 NOTE — Discharge Instructions (Signed)
Discharge Instructions after Shoulder Arthroplasty   A sling has been provided for you. Remove the sling 5 times each day to perform motion exercises. After the first 48 to 72 hours, discontinue using the sling. You should use the sling as a protective device, if you are in a crowd.  Use ice on the shoulder intermittently over the first 48 hours after surgery.  Pain medication has been prescribed for you.  Use your medication liberally over the first 48 hours, and then begin to taper your use. You may take Extra Strength Tylenol or Tylenol only in place of the pain pills. DO NOT take ANY nonsteroidal anti-inflammatory pain medications: Advil, Motrin, Ibuprofen, Aleve, Naproxen, or Naprosyn. Take one aspirin a day for 2 weeks after surgery, unless you have an aspirin sensitivity/allergy or asthma. You may remove your dressing after two days.  You may shower 5 days after surgery. The incision CANNOT get wet prior to 5 days. Simply allow the water to wash over the site and then pat dry. Do not rub the incision. Make sure your axilla (armpit) is completely dry after showering.  Active reaching and lifting are not permitted. You may use the operative arm for activities of daily living that do not require the operative arm to leave the side of the body, such as eating, drinking, bathing, etc.      Please call [phone] during normal business hours or [phone] after hours for any problems. Including the following:  - excessive redness of the incisions - drainage for more than 4 days - fever of more than 101.5 F  *Please note that pain medications will not be refilled after hours or on weekends.

## 2014-05-26 ENCOUNTER — Encounter (HOSPITAL_COMMUNITY): Payer: Self-pay | Admitting: General Practice

## 2014-05-26 LAB — CBC
HEMATOCRIT: 21.6 % — AB (ref 36.0–46.0)
Hemoglobin: 7 g/dL — ABNORMAL LOW (ref 12.0–15.0)
MCH: 33.5 pg (ref 26.0–34.0)
MCHC: 32.4 g/dL (ref 30.0–36.0)
MCV: 103.3 fL — AB (ref 78.0–100.0)
Platelets: 118 10*3/uL — ABNORMAL LOW (ref 150–400)
RBC: 2.09 MIL/uL — AB (ref 3.87–5.11)
RDW: 18.9 % — ABNORMAL HIGH (ref 11.5–15.5)
WBC: 4 10*3/uL (ref 4.0–10.5)

## 2014-05-26 LAB — PREPARE RBC (CROSSMATCH)

## 2014-05-26 MED ORDER — FUROSEMIDE 10 MG/ML IJ SOLN
20.0000 mg | Freq: Once | INTRAMUSCULAR | Status: AC
  Administered 2014-05-26: 20 mg via INTRAVENOUS
  Filled 2014-05-26: qty 2

## 2014-05-26 NOTE — Evaluation (Signed)
Occupational Therapy Evaluation Patient Details Name: [name] MRN: [id number] DOB: 01-09-1946 Today's Date: 05/26/2014    History of Present Illness Pt presents with L Reverse Shoulder  replacement after fall and fx.  pt with hx of Terminal Lung CA and hx PTSD.     Clinical Impression   Pt was living alone PTA with assist of her neighbor for IADL and her family for transportation.  She had come from chemotherapy when she fell entering her home.  Pt presents with poor activity tolerance (noted to desat to 84% on RA), impaired balance, generalized weakness, and immobilized L shoulder due to above fx.  Pt is dependent in self care and requires minimal assistance for mobility.  She wants to go home and stated everyone in her family works and then later she said she could line up 24 hour care.  Recommending SNF if pt is unable to find 24 hour capable assistance.  May be appropriate to involve Palliative Care for goals of care?    Follow Up Recommendations  SNF;Supervision/Assistance - 24 hour (will need HHOT if she declines SNF)    Equipment Recommendations       Recommendations for Other Services       Precautions / Restrictions Precautions Precautions: Fall;Shoulder Type of Shoulder Precautions: no shoulder movement-L, sling, AROM elbow to hand only Shoulder Interventions: Shoulder sling/immobilizer Precaution Booklet Issued: Yes (comment) Precaution Comments: briefly reviewed shoulder precautions during ADL, sling wear Required Braces or Orthoses: Sling Restrictions Weight Bearing Restrictions: Yes LUE Weight Bearing: Non weight bearing      Mobility Bed Mobility Overal bed mobility: Needs Assistance Bed Mobility: Rolling;Sidelying to Sit Rolling: Min guard Sidelying to sit: Min assist       General bed mobility comments: instructed pt to roll to right side to avoid WB on L UE  Transfers Overall transfer level: Needs assistance Equipment used: Quad  cane Transfers: Sit to/from Stand Sit to Stand: Min assist              Balance Overall balance assessment: Needs assistance Sitting-balance support: Feet supported Sitting balance-Leahy Scale: Good     Standing balance support: No upper extremity supported;During functional activity Standing balance-Leahy Scale: Poor                              ADL Overall ADL's : Needs assistance/impaired Eating/Feeding: Set up;Sitting   Grooming: Oral care;Minimal assistance;Standing Grooming Details (indicate cue type and reason): only able to tolerate 1 activity in standing Upper Body Bathing: Sitting;Maximal assistance   Lower Body Bathing: Maximal assistance;Sit to/from stand   Upper Body Dressing : Maximal assistance;Sitting   Lower Body Dressing: Maximal assistance;Sit to/from stand   Toilet Transfer: Minimal assistance;Ambulation           Functional mobility during ADLs: Minimal assistance;Cane General ADL Comments: Pt with poor activity tolerance. Became dyspneic with ambulation within room/bathroom and brushing teeth. Began instruction in sling use and one handed techniques for ADL.  Session limited by fatigue and plan for administration of blood.     Vision                     Perception     Praxis      Pertinent Vitals/Pain L shoulder pain, did not rate, premedicated.  Pt also has chronic pain.     Hand Dominance Right   Extremity/Trunk Assessment Upper Extremity Assessment Upper Extremity Assessment: LUE deficits/detail  LUE Deficits / Details: not shoulder movement, AROM only elbow to hand LUE: Unable to fully assess due to pain;Unable to fully assess due to immobilization LUE Coordination: decreased gross motor   Lower Extremity Assessment Lower Extremity Assessment: Defer to PT evaluation       Communication Communication Communication: No difficulties   Cognition Arousal/Alertness: Awake/alert Behavior During Therapy: WFL  for tasks assessed/performed Overall Cognitive Status: Within Functional Limits for tasks assessed                     General Comments       Exercises       Shoulder Instructions      Home Living Family/patient expects to be discharged to:: Private residence Living Arrangements: Alone Available Help at Discharge: Neighbor Type of Home: Apartment Home Access: [place]: One level     Bathroom Shower/Tub: Occupational psychologist: Standard     Home Equipment: Environmental consultant - 2 wheels;Cane - quad;Shower seat   Additional Comments: pt states she thinks her family can provide 24hr A for her at D/C.        Prior Functioning/Environment Level of Independence: Needs assistance  Gait / Transfers Assistance Needed: Uses QC, RW ADL's / Homemaking Assistance Needed: neighbor helps with meals and housekeeping, son takes her to appointments        OT Diagnosis: Generalized weakness;Acute pain   OT Problem List: Decreased strength;Decreased range of motion;Decreased activity tolerance;Impaired balance (sitting and/or standing);Decreased coordination;Decreased knowledge of use of DME or AE;Decreased knowledge of precautions;Cardiopulmonary status limiting activity;Obesity;Impaired UE functional use;Pain   OT Treatment/Interventions: Self-care/ADL training;Therapeutic exercise;DME and/or AE instruction;Energy conservation;Therapeutic activities;Patient/family education;Balance training    OT Goals(Current goals can be found in the care plan section) Acute Rehab OT Goals Patient Stated Goal: pt wants to go home, states she can find 24 hour supervision OT Goal Formulation: With patient Time For Goal Achievement: 06/02/14 Potential to Achieve Goals: Good ADL Goals Pt Will Perform Grooming: with supervision;standing (at least 2 activities) Pt Will Perform Upper Body Bathing: with supervision;sitting Pt Will Perform Lower Body Bathing: with supervision;sit  to/from stand Pt Will Perform Upper Body Dressing: with supervision;sitting Pt Will Perform Lower Body Dressing: with supervision;sit to/from stand Pt Will Transfer to Toilet: with supervision;ambulating;bedside commode (over toilet) Pt Will Perform Toileting - Clothing Manipulation and hygiene: with supervision;sit to/from stand Pt/caregiver will Perform Home Exercise Program: Left upper extremity;With written HEP provided;With Supervision (AROM elbow to hand) Additional ADL Goal #1: Pt will donn and doff sling with supervision. Additional ADL Goal #2: Pt will demonstrate ability to position L UE in bed and chair with supervision. Additional ADL Goal #3: Pt will utilize energy conservation strategies and breathing techniques in ADL with supervision.  OT Frequency: Min 3X/week   Barriers to D/C:            Co-evaluation PT/OT/SLP Co-Evaluation/Treatment: Yes Reason for Co-Treatment: For patient/therapist safety   OT goals addressed during session: ADL's and self-care      End of Session Equipment Utilized During Treatment: Gait belt;Oxygen Nurse Communication:  (pt with desaturation, request for breathing tx)  Activity Tolerance: Patient limited by fatigue Patient left: in chair;with call bell/phone within reach   Time: 0626-9485 OT Time Calculation (min): 36 min Charges:  OT General Charges $OT Visit: 1 Procedure OT Evaluation $Initial OT Evaluation Tier I: 1 Procedure OT Treatments $Self Care/Home Management : 8-22 mins G-Codes:    Haze Boyden Isham Smitherman  05/26/2014, 1:58 PM [phone]

## 2014-05-26 NOTE — Evaluation (Signed)
Physical Therapy Evaluation Patient Details Name: [name] MRN: [id number] DOB: 09/20/1946 Today's Date: 05/26/2014   History of Present Illness  pt presents with L Reverse Shoulder  replacement after fall and fx.  pt with hx of Terminal Lung CA and hx PTSD.    Clinical Impression  Pt requires A for all aspects of mobility and increased A with ADLs (see OT note).  Pt at one point states that all her family work and later states she thinks she can get 24hr A from family.  At this time feel SNF is safest D/C option for pt.  If pt decides to D/C to home, will need 24hr care and HHPT/OT, Aide, and SW.  Per MD H&P pt with Terminal Lung CA, would pt benefit from Palliative Consult to discuss [place] and A with discussing D/C options and resources?      Follow Up Recommendations SNF (? if pt will agree)    Equipment Recommendations  None recommended by PT    Recommendations for Other Services       Precautions / Restrictions Precautions Precautions: Fall;Shoulder Type of Shoulder Precautions: no shoulder movement-L, sling, AROM elbow to hand only Shoulder Interventions: Shoulder sling/immobilizer Precaution Booklet Issued: Yes (comment) Precaution Comments: briefly reviewed shoulder precautions during ADL, sling wear Required Braces or Orthoses: Sling Restrictions Weight Bearing Restrictions: Yes RUE Weight Bearing: Non weight bearing LUE Weight Bearing: Non weight bearing      Mobility  Bed Mobility Overal bed mobility: Needs Assistance Bed Mobility: Rolling;Sidelying to Sit Rolling: Min guard Sidelying to sit: Min assist       General bed mobility comments: instructed pt to roll to right side to avoid WB on L UE  Transfers Overall transfer level: Needs assistance Equipment used: Quad cane Transfers: Sit to/from Stand Sit to Stand: Min assist         General transfer comment: cues for Ue use and getting closer prior to sitting.    Ambulation/Gait Ambulation/Gait  assistance: Min assist Ambulation Distance (Feet): 15 Feet Assistive device: Quad cane Gait Pattern/deviations: Step-through pattern;Decreased stride length     General Gait Details: pt unsteady with QC, though states this is what she normally uses at home.    Stairs            Wheelchair Mobility    Modified Rankin (Stroke Patients Only)       Balance Overall balance assessment: Needs assistance Sitting-balance support: Feet supported Sitting balance-Leahy Scale: Good     Standing balance support: No upper extremity supported;During functional activity Standing balance-Leahy Scale: Poor                               Pertinent Vitals/Pain L shoulder.  Premedicated.      Home Living Family/patient expects to be discharged to:: Private residence Living Arrangements: Alone Available Help at Discharge: Neighbor Type of Home: Apartment Home Access: [place]: One level Home Equipment: Environmental consultant - 2 wheels;Cane - quad;Shower seat Additional Comments: pt states she thinks her family can provide 24hr A for her at D/C.      Prior Function Level of Independence: Needs assistance   Gait / Transfers Assistance Needed: Uses QC, RW  ADL's / Homemaking Assistance Needed: neighbor helps with meals and housekeeping, son takes her to appointments        Hand Dominance   Dominant Hand: Right    Extremity/Trunk Assessment  Upper Extremity Assessment: LUE deficits/detail       LUE Deficits / Details: not shoulder movement, AROM only elbow to hand   Lower Extremity Assessment: Defer to PT evaluation         Communication   Communication: No difficulties  Cognition Arousal/Alertness: Awake/alert Behavior During Therapy: WFL for tasks assessed/performed Overall Cognitive Status: Within Functional Limits for tasks assessed                      General Comments      Exercises        Assessment/Plan    PT Assessment  Patient needs continued PT services  PT Diagnosis Difficulty walking;Acute pain   PT Problem List Decreased strength;Decreased activity tolerance;Decreased balance;Decreased mobility;Decreased knowledge of use of DME;Decreased coordination;Pain  PT Treatment Interventions DME instruction;Gait training;Functional mobility training;Therapeutic activities;Therapeutic exercise;Balance training;Patient/family education   PT Goals (Current goals can be found in the Care Plan section) Acute Rehab PT Goals Patient Stated Goal: pt wants to go home, states she can find 24 hour supervision PT Goal Formulation: With patient Time For Goal Achievement: 06/09/14 Potential to Achieve Goals: Fair    Frequency Min 3X/week   Barriers to discharge Decreased caregiver support      Co-evaluation   Reason for Co-Treatment: For patient/therapist safety   OT goals addressed during session: ADL's and self-care       End of Session Equipment Utilized During Treatment: Gait belt Activity Tolerance: Patient tolerated treatment well Patient left: in chair;with call bell/phone within reach Nurse Communication: Mobility status         Time: 1100-1127 PT Time Calculation (min): 27 min   Charges:   PT Evaluation $Initial PT Evaluation Tier I: 1 Procedure PT Treatments $Gait Training: 8-22 mins   PT G CodesDoylene Bode [phone] 05/26/2014, 2:21 PM

## 2014-05-26 NOTE — Progress Notes (Signed)
Patietn is agreeable to SNF placement at [facility]. CSW will follow up in the am with a comprehensive psychosocial.  Rhea Pink, MSW, [place]

## 2014-05-26 NOTE — Progress Notes (Signed)
   PATIENT ID: [name]   2 Days Post-Op Procedure(s) (LRB):   Hemi-Arthroplasty  Left Shoulder (Left)  Subjective: Patient complaints of left shoulder pain, does not want to participate in therapy.   Objective:  Filed Vitals:   05/26/14 0537  BP: 119/52  Pulse: 75  Temp: 100.3 F (37.9 C)  Resp: 16     AWake, alert, orientated L UE dressing with spotted blood Wiggles fingers, distally NVI  Labs:   Recent Labs  05/24/14 1231 05/25/14 0520  HGB 9.2* 8.1*   Recent Labs  05/24/14 1231 05/25/14 0520  WBC 4.7 5.2  RBC 2.80* 2.42*  HCT 28.5* 24.9*  PLT 117* 128*   Recent Labs  05/24/14 1231 05/25/14 0520  NA 138 138  K 4.2 4.0  CL 101 99  CO2 26 29  BUN 9 7  CREATININE 0.65 0.64  GLUCOSE 88 116*  CALCIUM 8.5 8.0*    Assessment and Plan: 2 day s/p left shoulder hemiarthroplasty Talked to patient importance of PT/OT and SCDs, she agrees to try to get up today ABLA yesterday 8.1- awaiting labs this am and will continue to monitor  PT/OT today  VTE proph: SCDs

## 2014-05-27 LAB — TYPE AND SCREEN
ABO/RH(D): A POS
Antibody Screen: NEGATIVE
UNIT DIVISION: 0
Unit division: 0

## 2014-05-27 LAB — CBC
HEMATOCRIT: 26.6 % — AB (ref 36.0–46.0)
HEMOGLOBIN: 8.6 g/dL — AB (ref 12.0–15.0)
MCH: 31.6 pg (ref 26.0–34.0)
MCHC: 32.3 g/dL (ref 30.0–36.0)
MCV: 97.8 fL (ref 78.0–100.0)
Platelets: 128 10*3/uL — ABNORMAL LOW (ref 150–400)
RBC: 2.72 MIL/uL — ABNORMAL LOW (ref 3.87–5.11)
RDW: 22 % — ABNORMAL HIGH (ref 11.5–15.5)
WBC: 3.9 10*3/uL — AB (ref 4.0–10.5)

## 2014-05-27 NOTE — Clinical Social Work Placement (Addendum)
Clinical Social Work Department  CLINICAL SOCIAL WORK PLACEMENT NOTE   Patient: [name]  Account Number: [id number] Admit date: 05/24/14 Clinical Social Worker: Rhea Pink LCSWA Date/time: 12/06/2012 11:30 AM  Clinical Social Work is seeking post-discharge placement for this patient at the following level of care: SKILLED NURSING (*CSW will update this form in Epic as items are completed)  05/27/2014 Patient/family provided with [place] Department of Clinical Social Work's list of facilities offering this level of care within the geographic area requested by the patient (or if unable, by the patient's family).  05/27/2014 Patient/family informed of their freedom to choose among providers that offer the needed level of care, that participate in Medicare, Medicaid or managed care program needed by the patient, have an available bed and are willing to accept the patient.  05/27/2014 Patient/family informed of [facility]' ownership interest in [facility], as well as of the fact that they are under no obligation to receive care at this facility.  PASARR submitted to EDS on - PASSR system is down PASARR number received from EDS on  FL2 transmitted to all facilities in geographic area requested by pt/family on 05/27/2014 FL2 transmitted to all facilities within larger geographic area on  Patient informed that his/her managed care company has contracts with or will negotiate with certain facilities, including the following:  Patient/family informed of bed offers received: 05/27/2014 Patient chooses bed at [facility] Physician recommends and patient chooses bed at  Patient to be transferred to on 05/27/14 Patient to be transferred to facility by [facility] The following physician request were entered in Epic:  Additional Comments:

## 2014-05-27 NOTE — Progress Notes (Deleted)
Patient is refusing to go to SNF and is requesting home with home health. CSW informed RNCM. Clinical Social Worker will sign off for now as social work intervention is no longer needed. Please consult [place] again if new need arises.   Rhea Pink, MSW, [place]

## 2014-05-27 NOTE — Discharge Summary (Signed)
Patient ID: [name] MRN: [id number] DOB/AGE: 02-23-1946 68 y.o.  Admit date: 05/24/2014 Discharge date: 05/27/2014  Admission Diagnoses:  Active Problems:   S/P shoulder hemiarthroplasty   Discharge Diagnoses:  Same  Past Medical History  Diagnosis Date  . H/O: stroke 2012, 2000,     X3  . Hypertension   . Anxiety   . PTSD (post-traumatic stress disorder)   . Depression   . SVD (spontaneous vaginal delivery)     x 1  . Smoker   . Osteoarthritis     knees, hips hands,   . Shortness of breath   . Pain     SEVERE PAIN BACK, RIGHT HIP AND KNEES--HARRINGTON RODS AND CERVICAL PLATES--USES WALKER OR CANE WHEN AMBULATING - GOES TO A PAIN CLINIC-STATES SHE NEEDS RT HIP AND BOTH KNEES REPLACED. PT STATES SHE WAS TOLD THE CEMENT AROUND THE HARRINGTON RODS IS CRACKED.  [name] Hot flashes, menopausal     SEVERE  . Complication of anesthesia 03/02/13    severe headache,vertigo postop  . COPD (chronic obstructive pulmonary disease)     stable  . 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 Lung cancer   . Status post radiation therapy within four to twelve weeks 10/18/13-12/06/13    lung ca  66Gy  . Family history of anesthesia complication     equipment failure caused problem, suffered a stroke & heartattack    Surgeries: Procedure(s):   Hemi-Arthroplasty  Left Shoulder on 05/24/2014   Consultants:    Discharged Condition: Improved  Hospital Course: [name] is an 68 y.o. female who was admitted 05/24/2014 for operative treatment ofL displaced proximal humerus fracture.. Patient has severe unremitting pain that affects sleep, daily activities, and work/hobbies. She has terminal lung cancer and wanted this fracture treated for palliative reasons. After pre-op clearance the patient was taken to the operating room on 05/24/2014 and underwent  Procedure(s):   Hemi-Arthroplasty  Left Shoulder.    Patient was given perioperative antibiotics: Anti-infectives   Start     Dose/Rate Route  Frequency Ordered Stop   05/24/14 2200  ceFAZolin (ANCEF) IVPB 1 g/50 mL premix     1 g 100 mL/hr over 30 Minutes Intravenous Every 6 hours 05/24/14 2121 05/25/14 1138   05/24/14 1230  ceFAZolin (ANCEF) IVPB 2 g/50 mL premix  Status:  Discontinued     2 g 100 mL/hr over 30 Minutes Intravenous On call to O.R. 05/24/14 1215 05/24/14 2115       Patient was given sequential compression devices, early ambulation, to prevent DVT.  Allergy to Aspirin.  Patient benefited maximally from hospital stay and there were no complications.  PT consult reccommended by OT with concern for safe ambulation in the home. SNF reccommended.   Recent vital signs: Patient Vitals for the past 24 hrs:  BP Temp Temp src Pulse Resp SpO2  05/27/14 0600 102/52 mmHg 98.8 F (37.1 C) - 82 16 92 %  05/27/14 0040 107/55 mmHg 99 F (37.2 C) - 86 16 92 %  05/26/14 1958 81/56 mmHg 98.1 F (36.7 C) - 88 16 96 %  05/26/14 1430 100/50 mmHg 99.4 F (37.4 C) Oral 80 16 94 %  05/26/14 1349 98/49 mmHg 99.3 F (37.4 C) Oral 78 16 97 %  05/26/14 1150 109/56 mmHg 99.4 F (37.4 C) Oral 82 16 82 %  05/26/14 1132 94/44 mmHg 98.1 F (36.7 C) - 85 15 93 %     Recent laboratory studies:  Recent  Labs  05/24/14 1231 05/25/14 0520 05/26/14 0648 05/27/14 0600  WBC 4.7 5.2 4.0 3.9*  HGB 9.2* 8.1* 7.0* 8.6*  HCT 28.5* 24.9* 21.6* 26.6*  PLT 117* 128* 118* 128*  NA 138 138  --   --   K 4.2 4.0  --   --   CL 101 99  --   --   CO2 26 29  --   --   BUN 9 7  --   --   CREATININE 0.65 0.64  --   --   GLUCOSE 88 116*  --   --   CALCIUM 8.5 8.0*  --   --      Discharge Medications:     Medication List         albuterol 108 (90 BASE) MCG/ACT inhaler  Commonly known as:  VENTOLIN HFA  Inhale 2 puffs into the lungs every 6 (six) hours as needed for wheezing or shortness of breath.     albuterol (2.5 MG/3ML) 0.083% nebulizer solution  Commonly known as:  PROVENTIL  Take 2.5 mg by nebulization as needed for wheezing.      ALPRAZolam 1 MG tablet  Commonly known as:  XANAX  Take 1 tablet (1 mg total) by mouth every 6 (six) hours as needed for anxiety.     atenolol 50 MG tablet  Commonly known as:  TENORMIN  Take 1 tablet (50 mg total) by mouth daily.     chlorzoxazone 500 MG tablet  Commonly known as:  PARAFON  Take 500 mg by mouth 4 (four) times daily as needed for muscle spasms.     dextromethorphan-guaiFENesin 30-600 MG per 12 hr tablet  Commonly known as:  MUCINEX DM  Take 1 tablet by mouth every 12 (twelve) hours.     esomeprazole 40 MG capsule  Commonly known as:  NEXIUM  Take 40 mg by mouth daily as needed.     fentaNYL 25 MCG/HR patch  Commonly known as:  DURAGESIC - dosed mcg/hr  Place 25 mcg onto the skin every 3 (three) days.     ondansetron 4 MG disintegrating tablet  Commonly known as:  ZOFRAN ODT  Take 1 tablet (4 mg total) by mouth every 8 (eight) hours as needed for nausea or vomiting.     oxyCODONE-acetaminophen 10-325 MG per tablet  Commonly known as:  PERCOCET  Take 1 tablet by mouth every 4 (four) hours as needed for pain.     promethazine 25 MG tablet  Commonly known as:  PHENERGAN  Take 25 mg by mouth every 6 (six) hours as needed for nausea or vomiting.     sertraline 100 MG tablet  Commonly known as:  ZOLOFT  Take 200 mg by mouth daily.     trazodone 300 MG tablet  Commonly known as:  DESYREL  Take 300 mg by mouth at bedtime.     TUDORZA PRESSAIR 400 MCG/ACT Aepb  Generic drug:  Aclidinium Bromide  Inhale 1 puff into the lungs 2 (two) times daily.        Diagnostic Studies: Dg Chest 1 View  04/30/2014   CLINICAL DATA:  Shortness of breath, weakness, off wheezing.  EXAM: CHEST - 1 VIEW  COMPARISON:  CT CHEST W/CM dated 03/25/2014; DG CHEST 2 VIEW dated 12/31/2013; DG WRIST COMPLETE*L* dated 04/27/2014; DG PELVIS 1-2 VIEWS dated 04/30/2014; DG SHOULDER*L* dated 04/27/2014  FINDINGS: Normal heart size and pulmonary vascularity. Lucency in the upper lung suggesting  emphysema. Fibrosis in the lung  bases. Consolidation in the right mid lung behind the heart is again demonstrated and corresponds to mass and consolidation on previous chest CT. No definite blunting of costophrenic angles. Comminuted fracture of the proximal left humerus. This was seen on previous left shoulder radiograph from 04/27/2014.  IMPRESSION: Consolidation in the right lung base medially is again demonstrated and consistent with known pathologic process. Emphysematous changes in the lungs. Fracture proximal left humerus.   Electronically Signed   By: Lucienne Capers M.D.   On: 04/30/2014 04:08   Dg Pelvis 1-2 Views  04/30/2014   CLINICAL DATA:  Pelvic/low back pain status post fall  EXAM: PELVIS - 1-2 VIEW  COMPARISON:  04/27/2014  FINDINGS: No fracture or dislocation is seen.  Visualized bony pelvis appears intact.  Mild to moderate degenerative changes of the bilateral hips.  Postsurgical changes involving the lower lumbosacral spine, which is poorly visualized.  IMPRESSION: No fracture or dislocation is seen.   Electronically Signed   By: Julian Hy M.D.   On: 04/30/2014 02:37   Dg Pelvis 1-2 Views  04/27/2014   CLINICAL DATA:  Pelvic pain after fall.  EXAM: PELVIS - 1-2 VIEW  COMPARISON:  None.  FINDINGS: There is no evidence of pelvic fracture or diastasis. Severe degenerative joint disease is seen involving both hips.  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 No acute abnormality seen in the pelvis.   Electronically Signed   By: Sabino Dick M.D.   On: 04/27/2014 20:16   Dg Wrist Complete Left  04/27/2014   CLINICAL DATA:  [place].  Wrist pain.  EXAM: LEFT WRIST - COMPLETE 3+ VIEW  COMPARISON:  None.  FINDINGS: Mild degenerative changes are noted.  No acute fracture.  IMPRESSION: Degenerative changes but no acute wrist fracture.   Electronically Signed   By: Kalman Jewels M.D.   On: 04/27/2014 21:18   Ct Head Wo Contrast  04/27/2014   CLINICAL DATA:  [place].   Headache.  EXAM: CT HEAD WITHOUT CONTRAST  TECHNIQUE: Contiguous axial images were obtained from the base of the skull through the vertex without intravenous contrast.  COMPARISON:  11/22/2013  FINDINGS: The ventricles are normal in size and configuration. No extra-axial fluid collections are identified. The gray-white differentiation is normal. No CT findings for acute intracranial process such as hemorrhage or infarction. No mass lesions. The brainstem and cerebellum are grossly normal.The bony structures are intact. The paranasal sinuses and mastoid air cells are clear. The globes are intact.  IMPRESSION: No acute intracranial findings.  No change since prior study.   Electronically Signed   By: Kalman Jewels M.D.   On: 04/27/2014 20:18   Ct Cervical Spine Wo Contrast  04/27/2014   CLINICAL DATA:  Fall.  EXAM: CT CERVICAL SPINE WITHOUT CONTRAST  TECHNIQUE: Multidetector CT imaging of the cervical spine was performed without intravenous contrast. Multiplanar CT image reconstructions were also generated.  COMPARISON:  01/04/2010  FINDINGS: Normal alignment of the cervical spine. There are post surgical changes compatible with C5 through C7 anterior cervical disc fusion. The vertebral body heights are well maintained and there is no evidence for cervical spine fracture. The facet joints are well aligned. Facet joints appear well-aligned. Degenerative changes are noted at the atlantoaxial joint. Calcified atherosclerotic disease involves the coronary arteries. Sebaceous cyst is identified within the left posterior neck, image 9/series 9.  IMPRESSION: 1. No acute findings. 2. Prior fusion of C5-3 C7.   Electronically Signed   By: Kerby Moors  M.D.   On: 04/27/2014 20:12   Dg Shoulder Left  04/27/2014   CLINICAL DATA:  Severe LEFT shoulder pain post fall tonight  EXAM: LEFT SHOULDER - 2+ VIEW  COMPARISON:  None  FINDINGS: Osseous demineralization.  Degenerative changes AC joint.  Comminuted displaced proximal  LEFT humeral fracture.  Fracture planes are seen at the surgical neck and greater tuberosity.  No dislocation.  Visualized LEFT ribs appear intact.  IMPRESSION: Comminuted displaced proximal LEFT humeral fracture.   Electronically Signed   By: Lavonia Dana M.D.   On: 04/27/2014 20:16   Dg Shoulder Left Port  05/24/2014   CLINICAL DATA:  Left shoulder arthroplasty  EXAM: PORTABLE LEFT SHOULDER - 2+ VIEW  COMPARISON:  04/27/2014  FINDINGS: Single AP portable left shoulder view demonstrates a left shoulder hemiarthroplasty. No definite hardware abnormality. Fracture fragments remain about the proximal left humerus surgical neck region. Postop changes of the soft tissues. AC joint remains aligned.  IMPRESSION: Status post left shoulder hemiarthroplasty for a comminuted proximal left humerus surgical neck fracture. Left shoulder hardware appears aligned in the frontal plane.   Electronically Signed   By: Daryll Brod M.D.   On: 05/24/2014 19:30    Disposition: [place]      Discharge Instructions   Call MD / Call 911    Complete by:  As directed   If you experience chest pain or shortness of breath, CALL 911 and be transported to the hospital emergency room.  If you develope a fever above 101 F, pus (white drainage) or increased drainage or redness at the wound, or calf pain, call your surgeon's office.     Constipation Prevention    Complete by:  As directed   Drink plenty of fluids.  Prune juice may be helpful.  You may use a stool softener, such as Colace (over the counter) 100 mg twice a day.  Use MiraLax (over the counter) for constipation as needed.     Diet - low sodium heart healthy    Complete by:  As directed      Increase activity slowly as tolerated    Complete by:  As directed            Follow-up Information   Follow up with Nita Sells, MD. Schedule an appointment as soon as possible for a visit in 2 weeks.   Specialty:  Orthopedic Surgery    Contact information:   [address] [id number] [phone]        Signed: Grier Mitts 05/27/2014, 11:25 AM

## 2014-05-27 NOTE — Progress Notes (Signed)
CSW is awaiting PASSR. [place] MUST system is down.  Rhea Pink, MSW, [place]

## 2014-05-27 NOTE — Clinical Social Work Psychosocial (Signed)
Clinical Social Work Department  BRIEF PSYCHOSOCIAL ASSESSMENT  Patient: [name] Account Number: [id number]  Admit date: 05/24/14 Clinical Social Worker Rhea Pink, MSW Date/Time: 05/26/14 Referred by: Physician Date Referred: NA Referred for   SNF Placement   Other Referral:  Interview type: Patient at bedside Other interview type: PSYCHOSOCIAL DATA  Living Status: ALone in an apartment Admitted from facility:  Level of care:  Primary support name: [name] Primary support relationship to patient: Son Degree of support available:  Strong and vested- per patient  CURRENT CONCERNS  Current Concerns   Post-Acute Placement   Other Concerns:  SOCIAL WORK ASSESSMENT / PLAN  CSW met with pt at bedside to offer support and discuss SNF placement. Patient reported that she would llke to go home but feels she needs to go to [facility] for rehab. [name]. CSW informed patient that the paperwork process would be started for placement.  placement re: PT recommendation for SNF.   Pt lives alone  CSW explained placement process and answered questions.   Pt reports [name] as her preference    CSW completed FL2 and initiated SNF search.     Assessment/plan status: Information/Referral to [employer]  Other assessment/ plan:  Information/referral to community resources:  SNF   PTAR  PATIENT'S/FAMILY'S RESPONSE TO PLAN OF CARE:  Pt  reports she is agreeable to ST SNF in order to increase strength and independence with mobility prior to returning home  Pt verbalized understanding of placement process and appreciation for CSW assist.   Rhea Pink, MSW, [place]

## 2014-05-27 NOTE — Progress Notes (Signed)
Occupational Therapy Treatment Patient Details Name: [name] MRN: [id number] DOB: 07-19-1946 Today's Date: 05/27/2014    History of present illness pt presents with L Reverse Shoulder  replacement after fall and fx.  pt with hx of Terminal Lung CA and hx PTSD.     OT comments  Pt. Able to tolerate indicated AROM to L digits, wrist, and elbow  Follow Up Recommendations  SNF;Supervision/Assistance - 24 hour                 Precautions / Restrictions Precautions Precautions: Fall;Shoulder Type of Shoulder Precautions: no shoulder movement-L, sling, AROM elbow to hand only Shoulder Interventions: Shoulder sling/immobilizer Precaution Comments: reviewed shoulder precautions during ADL, sling wear Required Braces or Orthoses: Sling Restrictions Weight Bearing Restrictions: Yes LUE Weight Bearing: Non weight bearing                                             ADL Overall ADL's : Needs assistance/impaired                 Upper Body Dressing : Maximal assistance;Sitting Upper Body Dressing Details (indicate cue type and reason): requires assistance to don sling                                                                                                      Exercises Shoulder Exercises Elbow Flexion: AROM;Left;10 reps;Supine Elbow Extension: AROM;Left;10 reps;Supine Wrist Flexion: AROM;Left;10 reps;Supine Wrist Extension: AROM;Left;10 reps;Supine Digit Composite Flexion: AROM;Left;10 reps;Supine Composite Extension: AROM;Left;10 reps Donning/doffing sling/immobilizer: Maximal assistance Correct positioning of sling/immobilizer: Maximal assistance ROM for elbow, wrist and digits of operated UE: Min-guard   Shoulder Instructions Shoulder Instructions Donning/doffing sling/immobilizer: Maximal assistance Correct positioning of sling/immobilizer: Maximal assistance ROM for elbow, wrist and  digits of operated UE: Min-guard           Pertinent Vitals/ Pain      No c/o pain per pt.                                                          Frequency Min 3X/week     Progress Toward Goals  OT Goals(current goals can now be found in the care plan section)  Progress towards OT goals: Progressing toward goals     Plan Discharge plan remains appropriate                     End of Session     Activity Tolerance Patient tolerated treatment well   Patient Left in bed;with call bell/phone within reach             Time: 1116-1125 OT Time Calculation (min): 9 min  Charges: OT General Charges $OT Visit: 1 Procedure OT Treatments $Therapeutic Exercise: 8-22 mins  Anderson Malta  Sharol Given , COTA/L  05/27/2014, 1:47 PM

## 2014-05-27 NOTE — Progress Notes (Signed)
   PATIENT ID: [name]   3 Days Post-Op Procedure(s) (LRB):   Hemi-Arthroplasty  Left Shoulder (Left)  Subjective: Paint improving and feeling a little better after blood transfusion yesterday. Agreeable to SNF.   Objective:  Filed Vitals:   05/27/14 0600  BP: 102/52  Pulse: 82  Temp: 98.8 F (37.1 C)  Resp: 16     AWake, alert, orientated L UE dressing c/d/i Wiggles fingers, distally NVI  Labs:   Recent Labs  05/24/14 1231 05/25/14 0520 05/26/14 0648 05/27/14 0600  HGB 9.2* 8.1* 7.0* 8.6*   Recent Labs  05/26/14 0648 05/27/14 0600  WBC 4.0 3.9*  RBC 2.09* 2.72*  HCT 21.6* 26.6*  PLT 118* 128*   Recent Labs  05/24/14 1231 05/25/14 0520  NA 138 138  K 4.2 4.0  CL 101 99  CO2 26 29  BUN 9 7  CREATININE 0.65 0.64  GLUCOSE 88 116*  CALCIUM 8.5 8.0*    Assessment and Plan: 3 days s/p left shoulder hemiarthroplasty ABLA- improved to 8.6 after 2 united packed RBC yesterday Continue to work with PT/OT today Agreeable to SNF, FL2 signed in chart, will plan on this.  Likely d/c to SNF today Scripts in chart  VTE proph: SCDs

## 2014-05-29 NOTE — Procedures (Addendum)
Clinical social worker assisted with patient discharge to skilled nursing facility, [facility].  CSW addressed all family questions and concerns. CSW copied chart and added all important documents. CSW also set up patient transportation with [employer] operational officer. Clinical Social Worker will sign off for now as social work intervention is no longer needed.   Rhea Pink, MSW, [place]

## 2014-07-28 ENCOUNTER — Other Ambulatory Visit (HOSPITAL_COMMUNITY): Payer: Self-pay | Admitting: Internal Medicine

## 2014-07-28 ENCOUNTER — Telehealth: Payer: Self-pay | Admitting: Internal Medicine

## 2014-07-28 ENCOUNTER — Emergency Department (HOSPITAL_COMMUNITY)
Admission: EM | Admit: 2014-07-28 | Discharge: 2014-07-28 | Disposition: A | Discharge status: Home / Self Care | Payer: Medicare Other

## 2014-07-28 ENCOUNTER — Ambulatory Visit (HOSPITAL_COMMUNITY)
Admission: RE | Admit: 2014-07-28 | Discharge: 2014-07-28 | Disposition: A | Discharge status: Home / Self Care | Payer: Medicare Other | Source: Ambulatory Visit | Attending: Internal Medicine | Admitting: Internal Medicine

## 2014-07-28 DIAGNOSIS — J410 Simple chronic bronchitis: Secondary | ICD-10-CM

## 2014-07-28 NOTE — Telephone Encounter (Signed)
returned pt call .Marland KitchenMarland Kitchenpt not home...advised to call back

## 2014-07-28 NOTE — ED Notes (Signed)
Pt signed into ER by mistake, Taken to x-ray

## 2014-08-01 ENCOUNTER — Telehealth: Payer: Self-pay | Admitting: Medical Oncology

## 2014-08-01 NOTE — Telephone Encounter (Signed)
She was in an accident and wants to see " if she needs to get back on chemotherapy".Note to [place].

## 2014-08-02 ENCOUNTER — Other Ambulatory Visit: Payer: Self-pay | Admitting: Medical Oncology

## 2014-08-02 DIAGNOSIS — C3490 Malignant neoplasm of unspecified part of unspecified bronchus or lung: Secondary | ICD-10-CM

## 2014-08-05 ENCOUNTER — Telehealth: Payer: Self-pay | Admitting: Internal Medicine

## 2014-08-05 NOTE — Telephone Encounter (Signed)
s.w pt and advised on Aug appt...pt ok adn aware

## 2014-08-16 ENCOUNTER — Ambulatory Visit: Payer: Medicare Other | Admitting: Internal Medicine

## 2014-08-16 ENCOUNTER — Other Ambulatory Visit: Payer: Medicare Other

## 2014-08-17 ENCOUNTER — Telehealth: Payer: Self-pay | Admitting: Internal Medicine

## 2014-08-17 NOTE — Telephone Encounter (Signed)
Pt confirmed labs/ov per 08/18 r/s from missed visit 08/18 due to going to wrong Dr., gave pt AVS....KJ

## 2014-08-29 ENCOUNTER — Ambulatory Visit: Payer: Medicare Other | Admitting: Internal Medicine

## 2014-08-29 ENCOUNTER — Telehealth: Payer: Self-pay | Admitting: Internal Medicine

## 2014-08-29 ENCOUNTER — Other Ambulatory Visit (HOSPITAL_BASED_OUTPATIENT_CLINIC_OR_DEPARTMENT_OTHER): Payer: Medicare Other

## 2014-08-29 ENCOUNTER — Encounter: Payer: Self-pay | Admitting: Internal Medicine

## 2014-08-29 ENCOUNTER — Other Ambulatory Visit: Payer: Medicare Other

## 2014-08-29 ENCOUNTER — Ambulatory Visit (HOSPITAL_BASED_OUTPATIENT_CLINIC_OR_DEPARTMENT_OTHER): Payer: Medicare Other | Admitting: Internal Medicine

## 2014-08-29 VITALS — BP 152/89 | HR 91 | Temp 98.5°F | Resp 20 | Ht 66.0 in | Wt 161.3 lb

## 2014-08-29 DIAGNOSIS — C3410 Malignant neoplasm of upper lobe, unspecified bronchus or lung: Secondary | ICD-10-CM

## 2014-08-29 DIAGNOSIS — C3430 Malignant neoplasm of lower lobe, unspecified bronchus or lung: Secondary | ICD-10-CM

## 2014-08-29 DIAGNOSIS — C3490 Malignant neoplasm of unspecified part of unspecified bronchus or lung: Secondary | ICD-10-CM

## 2014-08-29 LAB — CBC WITH DIFFERENTIAL/PLATELET
BASO%: 0.7 % (ref 0.0–2.0)
BASOS ABS: 0 10*3/uL (ref 0.0–0.1)
EOS ABS: 0.1 10*3/uL (ref 0.0–0.5)
EOS%: 2.8 % (ref 0.0–7.0)
HEMATOCRIT: 36.3 % (ref 34.8–46.6)
HEMOGLOBIN: 11.4 g/dL — AB (ref 11.6–15.9)
LYMPH%: 21.5 % (ref 14.0–49.7)
MCH: 31.1 pg (ref 25.1–34.0)
MCHC: 31.4 g/dL — ABNORMAL LOW (ref 31.5–36.0)
MCV: 98.9 fL (ref 79.5–101.0)
MONO#: 0.2 10*3/uL (ref 0.1–0.9)
MONO%: 5.3 % (ref 0.0–14.0)
NEUT#: 2 10*3/uL (ref 1.5–6.5)
NEUT%: 69.7 % (ref 38.4–76.8)
Platelets: 150 10*3/uL (ref 145–400)
RBC: 3.67 10*6/uL — ABNORMAL LOW (ref 3.70–5.45)
RDW: 13.9 % (ref 11.2–14.5)
WBC: 2.8 10*3/uL — ABNORMAL LOW (ref 3.9–10.3)
lymph#: 0.6 10*3/uL — ABNORMAL LOW (ref 0.9–3.3)

## 2014-08-29 LAB — COMPREHENSIVE METABOLIC PANEL (CC13)
ALBUMIN: 3.2 g/dL — AB (ref 3.5–5.0)
ALK PHOS: 113 U/L (ref 40–150)
ALT: 9 U/L (ref 0–55)
AST: 14 U/L (ref 5–34)
Anion Gap: 8 mEq/L (ref 3–11)
BUN: 5.7 mg/dL — AB (ref 7.0–26.0)
CO2: 29 mEq/L (ref 22–29)
Calcium: 8.5 mg/dL (ref 8.4–10.4)
Chloride: 103 mEq/L (ref 98–109)
Creatinine: 0.8 mg/dL (ref 0.6–1.1)
GLUCOSE: 91 mg/dL (ref 70–140)
Potassium: 4 mEq/L (ref 3.5–5.1)
Sodium: 140 mEq/L (ref 136–145)
Total Protein: 6.6 g/dL (ref 6.4–8.3)

## 2014-08-29 MED ORDER — HYDROCODONE-ACETAMINOPHEN 5-325 MG PO TABS: 1.0000 | ORAL_TABLET | Freq: Four times a day (QID) | ORAL | Status: DC | PRN

## 2014-08-29 NOTE — Telephone Encounter (Signed)
gv pt appt schedule for sept including appt and prep for ct @ [facility]. all appts on appt desk.

## 2014-08-29 NOTE — Patient Instructions (Signed)
Smoking Cessation, Tips for Success  If you are ready to quit smoking, congratulations! You have chosen to help yourself be healthier. Cigarettes bring nicotine, tar, carbon monoxide, and other irritants into your body. Your lungs, heart, and blood vessels will be able to work better without these poisons. There are many different ways to quit smoking. Nicotine gum, nicotine patches, a nicotine inhaler, or nicotine nasal spray can help with physical craving. Hypnosis, support groups, and medicines help break the habit of smoking.  WHAT THINGS CAN I DO TO MAKE QUITTING EASIER?   Here are some tips to help you quit for good:  · Pick a date when you will quit smoking completely. Tell all of your friends and family about your plan to quit on that date.  · Do not try to slowly cut down on the number of cigarettes you are smoking. Pick a quit date and quit smoking completely starting on that day.  · Throw away all cigarettes.    · Clean and remove all ashtrays from your home, work, and car.  · On a card, write down your reasons for quitting. Carry the card with you and read it when you get the urge to smoke.  · Cleanse your body of nicotine. Drink enough water and fluids to keep your urine clear or pale yellow. Do this after quitting to flush the nicotine from your body.  · Learn to predict your moods. Do not let a bad situation be your excuse to have a cigarette. Some situations in your life might tempt you into wanting a cigarette.  · Never have "just one" cigarette. It leads to wanting another and another. Remind yourself of your decision to quit.  · Change habits associated with smoking. If you smoked while driving or when feeling stressed, try other activities to replace smoking. Stand up when drinking your coffee. Brush your teeth after eating. Sit in a different chair when you read the paper. Avoid alcohol while trying to quit, and try to drink fewer caffeinated beverages. Alcohol and caffeine may urge you to  smoke.  · Avoid foods and drinks that can trigger a desire to smoke, such as sugary or spicy foods and alcohol.  · Ask people who smoke not to smoke around you.  · Have something planned to do right after eating or having a cup of coffee. For example, plan to take a walk or exercise.  · Try a relaxation exercise to calm you down and decrease your stress. Remember, you may be tense and nervous for the first 2 weeks after you quit, but this will pass.  · Find new activities to keep your hands busy. Play with a pen, coin, or rubber band. Doodle or draw things on paper.  · Brush your teeth right after eating. This will help cut down on the craving for the taste of tobacco after meals. You can also try mouthwash.    · Use oral substitutes in place of cigarettes. Try using lemon drops, carrots, cinnamon sticks, or chewing gum. Keep them handy so they are available when you have the urge to smoke.  · When you have the urge to smoke, try deep breathing.  · Designate your home as a nonsmoking area.  · If you are a heavy smoker, ask your health care provider about a prescription for nicotine chewing gum. It can ease your withdrawal from nicotine.  · Reward yourself. Set aside the cigarette money you save and buy yourself something nice.  · Look for   support from others. Join a support group or smoking cessation program. Ask someone at home or at work to help you with your plan to quit smoking.  · Always ask yourself, "Do I need this cigarette or is this just a reflex?" Tell yourself, "Today, I choose not to smoke," or "I do not want to smoke." You are reminding yourself of your decision to quit.  · Do not replace cigarette smoking with electronic cigarettes (commonly called e-cigarettes). The safety of e-cigarettes is unknown, and some may contain harmful chemicals.  · If you relapse, do not give up! Plan ahead and think about what you will do the next time you get the urge to smoke.  HOW WILL I FEEL WHEN I QUIT SMOKING?  You  may have symptoms of withdrawal because your body is used to nicotine (the addictive substance in cigarettes). You may crave cigarettes, be irritable, feel very hungry, cough often, get headaches, or have difficulty concentrating. The withdrawal symptoms are only temporary. They are strongest when you first quit but will go away within 10-14 days. When withdrawal symptoms occur, stay in control. Think about your reasons for quitting. Remind yourself that these are signs that your body is healing and getting used to being without cigarettes. Remember that withdrawal symptoms are easier to treat than the major diseases that smoking can cause.   Even after the withdrawal is over, expect periodic urges to smoke. However, these cravings are generally short lived and will go away whether you smoke or not. Do not smoke!  WHAT RESOURCES ARE AVAILABLE TO HELP ME QUIT SMOKING?  Your health care provider can direct you to community resources or hospitals for support, which may include:  · Group support.  · Education.  · Hypnosis.  · Therapy.  Document Released: 09/13/2004 Document Revised: 05/02/2014 Document Reviewed: 06/03/2013  [employer]® Patient Information ©2015 [employer]. This information is not intended to replace advice given to you by your health care provider. Make sure you discuss any questions you have with your health care provider.

## 2014-08-29 NOTE — Progress Notes (Signed)
[facility] [phone]   Fax:([phone]) [place], [place]  [place] [address]  DIAGNOSIS: Locally advanced, likely stage IIIA (T2a., N2, M0) non-small cell lung cancer, squamous cell carcinoma diagnosed in September of 2014.   PRIOR THERAPY: Concurrent chemoradiation with weekly carboplatin for AUC of 2 and paclitaxel 45 mg/M2, status post 7 cycles, last cycle was given 11/29/2013 with partial response. First dose was given on 10/18/2013.    CURRENT THERAPY: Systemic chemotherapy with carboplatin for AUC of 5 and paclitaxel 175 mg/M2 every 3 weeks with Neulasta support. First cycle on 04/06/2014. Status post 2 cycles.  CHEMOTHERAPY INTENT: Curative/control  CURRENT # OF CHEMOTHERAPY CYCLES: 2 CURRENT ANTIEMETICS: Zofran, Compazine and dexamethasone  CURRENT SMOKING STATUS: Current smoker and she was strongly advised to quit smoking and also to smoke cessation program  ORAL CHEMOTHERAPY AND CONSENT: None  CURRENT BISPHOSPHONATES USE: None  PAIN MANAGEMENT: 0/10  NARCOTICS INDUCED CONSTIPATION: None  LIVING WILL AND CODE STATUS: Full code  INTERVAL HISTORY: [name] 68 y.o. female returns to the clinic today for follow up visit accompanied by her niece. The patient completed 2 cycles of systemic chemotherapy with carboplatin and paclitaxel that her treatment was discontinued after she has an accident, requiring left shoulder him he arthroplasty. She has been observation for the last 3 months. She came today for evaluation and recommendation regarding her current condition. She denied having any significant chest pain but continues to have shortness of breath with exertion with no hemoptysis. She denied having any significant weight loss or night sweats. The patient unfortunately continues to smoke 3-5 cigarettes every day but she is trying to quit but not successful and she is not interested in the smoke  cessation programs. She denied having any significant fever or chills, no nausea or vomiting.   MEDICAL HISTORY: Past Medical History  Diagnosis Date  . H/O: stroke 2012, 2000,     X3  . Hypertension   . Anxiety   . PTSD (post-traumatic stress disorder)   . Depression   . SVD (spontaneous vaginal delivery)     x 1  . Smoker   . Osteoarthritis     knees, hips hands,   . Shortness of breath   . Pain     SEVERE PAIN BACK, RIGHT HIP AND KNEES--HARRINGTON RODS AND CERVICAL PLATES--USES WALKER OR CANE WHEN AMBULATING - GOES TO A PAIN CLINIC-STATES SHE NEEDS RT HIP AND BOTH KNEES REPLACED. PT STATES SHE WAS TOLD THE CEMENT AROUND THE HARRINGTON RODS IS CRACKED.  [name] Hot flashes, menopausal     SEVERE  . Complication of anesthesia 03/02/13    severe headache,vertigo postop  . COPD (chronic obstructive pulmonary disease)     stable  . 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 Lung cancer   . Status post radiation therapy within four to twelve weeks 10/18/13-12/06/13    lung ca  66Gy  . Family history of anesthesia complication     equipment failure caused problem, suffered a stroke & heartattack    ALLERGIES:  is allergic to carafate; prednisone; nsaids; aspirin; ibuprofen; and dulera.  MEDICATIONS:  Current Outpatient Prescriptions  Medication Sig Dispense Refill  . Aclidinium Bromide (TUDORZA PRESSAIR) 400 MCG/ACT AEPB Inhale 1 puff into the lungs 2 (two) times daily.      [name] albuterol (PROVENTIL) (2.5 MG/3ML) 0.083% nebulizer solution Take 2.5 mg by nebulization as needed  for wheezing.      [name] albuterol (VENTOLIN HFA) 108 (90 BASE) MCG/ACT inhaler Inhale 2 puffs into the lungs every 6 (six) hours as needed for wheezing or shortness of breath.  1 Inhaler  11  . ALPRAZolam (XANAX) 1 MG tablet Take 1 tablet (1 mg total) by mouth every 6 (six) hours as needed for anxiety.  30 tablet  0  . atenolol (TENORMIN) 50 MG tablet Take 1 tablet (50 mg total) by mouth daily.  30 tablet  0  . chlorzoxazone  (PARAFON) 500 MG tablet Take 500 mg by mouth 4 (four) times daily as needed for muscle spasms.      [name] dextromethorphan-guaiFENesin (MUCINEX DM) 30-600 MG per 12 hr tablet Take 1 tablet by mouth every 12 (twelve) hours.      [name] esomeprazole (NEXIUM) 40 MG capsule Take 40 mg by mouth daily as needed.       . fentaNYL (DURAGESIC - DOSED MCG/HR) 25 MCG/HR patch Place 25 mcg onto the skin every 3 (three) days.      . ondansetron (ZOFRAN ODT) 4 MG disintegrating tablet Take 1 tablet (4 mg total) by mouth every 8 (eight) hours as needed for nausea or vomiting.  20 tablet  0  . promethazine (PHENERGAN) 25 MG tablet Take 25 mg by mouth every 6 (six) hours as needed for nausea or vomiting.      . sertraline (ZOLOFT) 100 MG tablet Take 200 mg by mouth daily.       . trazodone (DESYREL) 300 MG tablet Take 300 mg by mouth at bedtime.      [name] HYDROcodone-acetaminophen (NORCO) 5-325 MG per tablet Take 1 tablet by mouth every 6 (six) hours as needed for moderate pain.  30 tablet  0   No current facility-administered medications for this visit.    SURGICAL HISTORY:  Past Surgical History  Procedure Laterality Date  . Carpal tunnel release      BILATERAL  . Nasal sinus surgery    . Lumbar fusion      L4-S1  . Cervical fusion    . Hernia repair    . Salpingoophorectomy Right   . Vaginal hysterectomy      TAH w/ ovary removal  . Transthoracic echocardiogram  12-03-2011    LVSF NORMAL/ EF 60-65%  . Vulvectomy N/A 03/02/2013    Procedure: WIDE LOCAL EXCISION VULVAR;  Surgeon: Imagene Gurney A. Alycia Rossetti, MD;  Location: [facility];  Service: Gynecology;  Laterality: N/A;  . Co2 laser application N/A 0/63/0160    Procedure: LASER APPLICATION OF THE VULVA;  Surgeon: Janie Morning, MD;  Location[facility];  Service: Gynecology;  Laterality: N/A;  . Video bronchoscopy Bilateral 09/23/2013    Procedure: VIDEO BRONCHOSCOPY WITHOUT FLUORO;  Surgeon: Tanda Rockers, MD;  Location: [facility];  Service:  Cardiopulmonary;  Laterality: Bilateral;  . Hemiarthroplasty shoulder fracture Left 05/25/2014    DR CHANDLER  . Reverse shoulder arthroplasty Left 05/24/2014    Procedure:   Hemi-Arthroplasty  Left Shoulder;  Surgeon: Nita Sells, MD;  Location: [facility];  Service: Orthopedics;  Laterality: Left;    REVIEW OF SYSTEMS:  Constitutional: positive for fatigue Eyes: negative Ears, nose, mouth, throat, and face: negative Respiratory: positive for cough, dyspnea on exertion and sputum Cardiovascular: negative Gastrointestinal: negative Genitourinary:negative Integument/breast: negative Hematologic/lymphatic: negative Musculoskeletal:positive for muscle weakness Neurological: negative Behavioral/Psych: negative Endocrine: negative Allergic/Immunologic: negative   PHYSICAL EXAMINATION: General appearance: alert, cooperative, fatigued and no distress Head: Normocephalic, without obvious  abnormality, atraumatic Neck: no adenopathy, no JVD, supple, symmetrical, trachea midline and thyroid not enlarged, symmetric, no tenderness/mass/nodules Lymph nodes: Cervical, supraclavicular, and axillary nodes normal. Resp: wheezes bilaterally Back: symmetric, no curvature. ROM normal. No CVA tenderness. Cardio: regular rate and rhythm, S1, S2 normal, no murmur, click, rub or gallop GI: soft, non-tender; bowel sounds normal; no masses,  no organomegaly Extremities: extremities normal, atraumatic, no cyanosis or edema Neurologic: Alert and oriented X 3, normal strength and tone. Normal symmetric reflexes. Normal coordination and gait  ECOG PERFORMANCE STATUS: 2 - Symptomatic, <50% confined to bed  Blood pressure 152/89, pulse 91, temperature 98.5 F (36.9 C), temperature source Oral, resp. rate 20, height 5\' 6"  (1.676 m), weight 161 lb 4.8 oz (73.165 kg).  LABORATORY DATA: Lab Results  Component Value Date   WBC 2.8* 08/29/2014   HGB 11.4* 08/29/2014   HCT 36.3 08/29/2014   MCV 98.9 08/29/2014     PLT 150 08/29/2014      Chemistry      Component Value Date/Time   NA 140 08/29/2014 1339   NA 138 05/25/2014 0520   K 4.0 08/29/2014 1339   K 4.0 05/25/2014 0520   CL 99 05/25/2014 0520   CO2 29 08/29/2014 1339   CO2 29 05/25/2014 0520   BUN 5.7* 08/29/2014 1339   BUN 7 05/25/2014 0520   CREATININE 0.8 08/29/2014 1339   CREATININE 0.64 05/25/2014 0520      Component Value Date/Time   CALCIUM 8.5 08/29/2014 1339   CALCIUM 8.0* 05/25/2014 0520   ALKPHOS 113 08/29/2014 1339   ALKPHOS 146* 04/20/2014 1108   AST 14 08/29/2014 1339   AST 15 04/20/2014 1108   ALT 9 08/29/2014 1339   ALT 7 04/20/2014 1108   BILITOT <0.20 08/29/2014 1339   BILITOT 0.2* 04/20/2014 1108       RADIOGRAPHIC STUDIES:  ASSESSMENT AND PLAN: This is a very pleasant 68 years old white female with stage IIIA non-small cell lung cancer completed a course of concurrent chemoradiation with weekly carboplatin and paclitaxel and tolerating her treatment fairly well. She completed 2 more cycles of systemic chemotherapy with carboplatin and paclitaxel every 3 weeks but her treatment was discontinued after the patient had surgery for the left shoulder and has been observation for the last 3 months. I recommended for the patient to have repeat CT scan of the chest, abdomen and pelvis for reevaluation of her disease. She would come back for followup visit in 3 weeks for reevaluation. She was advised to call immediately if she has any concerning symptoms in the interval. The patient voices understanding of current disease status and treatment options and is in agreement with the current care plan.  All questions were answered. The patient knows to call the clinic with any problems, questions or concerns. We can certainly see the patient much sooner if necessary.  Disclaimer: This note was dictated with voice recognition software. Similar sounding words can inadvertently be transcribed and may not be corrected upon review.

## 2014-09-07 ENCOUNTER — Other Ambulatory Visit: Payer: Self-pay | Admitting: Medical Oncology

## 2014-09-07 ENCOUNTER — Telehealth: Payer: Self-pay | Admitting: Medical Oncology

## 2014-09-07 ENCOUNTER — Other Ambulatory Visit: Payer: Self-pay | Admitting: Cardiology

## 2014-09-07 DIAGNOSIS — C3490 Malignant neoplasm of unspecified part of unspecified bronchus or lung: Secondary | ICD-10-CM

## 2014-09-07 NOTE — Telephone Encounter (Signed)
She does not want to drink contrast for Ct scan.

## 2014-09-07 NOTE — Telephone Encounter (Signed)
Confirmed pt CT chest /a/p is with iv contrast and that pt will not be drinking oral contrast.

## 2014-09-07 NOTE — Telephone Encounter (Signed)
Per Dr Julien Nordmann , change Ct a/p to without contrast.radiology and pt notified.

## 2014-09-15 ENCOUNTER — Ambulatory Visit (HOSPITAL_COMMUNITY): Payer: Medicare Other

## 2014-09-15 ENCOUNTER — Ambulatory Visit (HOSPITAL_COMMUNITY)
Admission: RE | Admit: 2014-09-15 | Discharge: 2014-09-15 | Disposition: A | Discharge status: Home / Self Care | Payer: Medicare Other | Source: Ambulatory Visit | Attending: Internal Medicine | Admitting: Internal Medicine

## 2014-09-15 DIAGNOSIS — R911 Solitary pulmonary nodule: Secondary | ICD-10-CM | POA: Insufficient documentation

## 2014-09-15 DIAGNOSIS — C3490 Malignant neoplasm of unspecified part of unspecified bronchus or lung: Secondary | ICD-10-CM | POA: Diagnosis present

## 2014-09-15 DIAGNOSIS — C3410 Malignant neoplasm of upper lobe, unspecified bronchus or lung: Secondary | ICD-10-CM

## 2014-09-15 MED ORDER — IOHEXOL 300 MG/ML  SOLN
100.0000 mL | Freq: Once | INTRAMUSCULAR | Status: AC | PRN
  Administered 2014-09-15: 100 mL via INTRAVENOUS

## 2014-09-20 ENCOUNTER — Encounter: Payer: Self-pay | Admitting: Internal Medicine

## 2014-09-20 ENCOUNTER — Telehealth: Payer: Self-pay | Admitting: *Deleted

## 2014-09-20 ENCOUNTER — Ambulatory Visit (HOSPITAL_BASED_OUTPATIENT_CLINIC_OR_DEPARTMENT_OTHER): Payer: Medicare Other | Admitting: Internal Medicine

## 2014-09-20 ENCOUNTER — Ambulatory Visit: Payer: Medicare Other | Admitting: Internal Medicine

## 2014-09-20 ENCOUNTER — Telehealth: Payer: Self-pay | Admitting: Internal Medicine

## 2014-09-20 VITALS — BP 123/63 | HR 78 | Temp 98.5°F | Resp 17 | Ht 66.0 in | Wt 158.2 lb

## 2014-09-20 DIAGNOSIS — R21 Rash and other nonspecific skin eruption: Secondary | ICD-10-CM

## 2014-09-20 DIAGNOSIS — R5383 Other fatigue: Secondary | ICD-10-CM

## 2014-09-20 DIAGNOSIS — C3491 Malignant neoplasm of unspecified part of right bronchus or lung: Secondary | ICD-10-CM

## 2014-09-20 DIAGNOSIS — F17200 Nicotine dependence, unspecified, uncomplicated: Secondary | ICD-10-CM

## 2014-09-20 DIAGNOSIS — R5381 Other malaise: Secondary | ICD-10-CM

## 2014-09-20 DIAGNOSIS — R911 Solitary pulmonary nodule: Secondary | ICD-10-CM

## 2014-09-20 DIAGNOSIS — C3430 Malignant neoplasm of lower lobe, unspecified bronchus or lung: Secondary | ICD-10-CM

## 2014-09-20 NOTE — Telephone Encounter (Signed)
Per staff message and POF I have scheduled appts. Advised scheduler of appts. JMW  

## 2014-09-20 NOTE — Progress Notes (Signed)
[place] [phone]   [phone]  OFFICE PROGRESS NOTE  Gabriela Cahill, MD  [address] [place] [address]  DIAGNOSIS: Metastatic non-small cell lung cancer initially diagnosed as Locally advanced, likely stage IIIA (T2a., N2, M0) non-small cell lung cancer, squamous cell carcinoma diagnosed in September of 2014.   PRIOR THERAPY:  1) Concurrent chemoradiation with weekly carboplatin for AUC of 2 and paclitaxel 45 mg/M2, status post 7 cycles, last cycle was given 11/29/2013 with partial response. First dose was given on 10/18/2013.   2) Systemic chemotherapy with carboplatin for AUC of 5 and paclitaxel 175 mg/M2 every 3 weeks with Neulasta support. First cycle on 04/06/2014. Status post 2 cycles.  CURRENT THERAPY: Immunotherapy with Nivolumab 3 mg/KG every 2 weeks. First cycle 09/27/2014  CHEMOTHERAPY INTENT: Curative/control  CURRENT # OF CHEMOTHERAPY CYCLES: 1 CURRENT ANTIEMETICS: Zofran, Compazine and dexamethasone  CURRENT SMOKING STATUS: Current smoker and she was strongly advised to quit smoking and also to smoke cessation program  ORAL CHEMOTHERAPY AND CONSENT: None  CURRENT BISPHOSPHONATES USE: None  PAIN MANAGEMENT: 0/10  NARCOTICS INDUCED CONSTIPATION: None  LIVING WILL AND CODE STATUS: Full code  INTERVAL HISTORY: [name] 68 y.o. female returns to the clinic today for follow up visit accompanied by her Aunt. The patient continues to complain of aching pain all over especially in the shoulder area after her recent films and arthroplasty of the shoulder. She is currently on Norco 5/325 mg 1-2 tablets every 6 hours but her pain is not well controlled. This is currently managed by her orthopedic surgeon as well as primary care physician. She denied having any significant chest pain but continues to have shortness of breath with exertion with no hemoptysis. She denied having any significant weight loss or night sweats. She had  repeat CT scan of the chest, abdomen and pelvis performed recently and she is here for evaluation and discussion of her scan results.  MEDICAL HISTORY: Past Medical History  Diagnosis Date  . H/O: stroke 2012, 2000,     X3  . Hypertension   . Anxiety   . PTSD (post-traumatic stress disorder)   . Depression   . SVD (spontaneous vaginal delivery)     x 1  . Smoker   . Osteoarthritis     knees, hips hands,   . Shortness of breath   . Pain     SEVERE PAIN BACK, RIGHT HIP AND KNEES--HARRINGTON RODS AND CERVICAL PLATES--USES WALKER OR CANE WHEN AMBULATING - GOES TO A PAIN CLINIC-STATES SHE NEEDS RT HIP AND BOTH KNEES REPLACED. PT STATES SHE WAS TOLD THE CEMENT AROUND THE HARRINGTON RODS IS CRACKED.  [name] Hot flashes, menopausal     SEVERE  . Complication of anesthesia 03/02/13    severe headache,vertigo postop  . COPD (chronic obstructive pulmonary disease)     stable  . 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 Lung cancer   . Status post radiation therapy within four to twelve weeks 10/18/13-12/06/13    lung ca  66Gy  . Family history of anesthesia complication     equipment failure caused problem, suffered a stroke & heartattack    ALLERGIES:  is allergic to carafate; prednisone; nsaids; aspirin; ibuprofen; and dulera.  MEDICATIONS:  Current Outpatient Prescriptions  Medication Sig Dispense Refill  . Aclidinium Bromide (TUDORZA PRESSAIR) 400 MCG/ACT AEPB Inhale 1 puff into the lungs 2 (two) times daily.      [name]  albuterol (PROVENTIL) (2.5 MG/3ML) 0.083% nebulizer solution Take 2.5 mg by nebulization as needed for wheezing.      [name] albuterol (VENTOLIN HFA) 108 (90 BASE) MCG/ACT inhaler Inhale 2 puffs into the lungs every 6 (six) hours as needed for wheezing or shortness of breath.  1 Inhaler  11  . ALPRAZolam (XANAX) 1 MG tablet Take 1 tablet (1 mg total) by mouth every 6 (six) hours as needed for anxiety.  30 tablet  0  . atenolol (TENORMIN) 50 MG tablet Take 1 tablet (50 mg total) by mouth  daily.  30 tablet  0  . chlorzoxazone (PARAFON) 500 MG tablet Take 500 mg by mouth 4 (four) times daily as needed for muscle spasms.      [name] dextromethorphan-guaiFENesin (MUCINEX DM) 30-600 MG per 12 hr tablet Take 1 tablet by mouth every 12 (twelve) hours.      [name] esomeprazole (NEXIUM) 40 MG capsule Take 40 mg by mouth daily as needed.       . fentaNYL (DURAGESIC - DOSED MCG/HR) 25 MCG/HR patch Place 25 mcg onto the skin every 3 (three) days.      [name] HYDROcodone-acetaminophen (NORCO) 5-325 MG per tablet Take 1 tablet by mouth every 6 (six) hours as needed for moderate pain.  30 tablet  0  . ondansetron (ZOFRAN ODT) 4 MG disintegrating tablet Take 1 tablet (4 mg total) by mouth every 8 (eight) hours as needed for nausea or vomiting.  20 tablet  0  . promethazine (PHENERGAN) 25 MG tablet Take 25 mg by mouth every 6 (six) hours as needed for nausea or vomiting.      . sertraline (ZOLOFT) 100 MG tablet Take 200 mg by mouth daily.       . trazodone (DESYREL) 300 MG tablet Take 300 mg by mouth at bedtime.       No current facility-administered medications for this visit.    SURGICAL HISTORY:  Past Surgical History  Procedure Laterality Date  . Carpal tunnel release      BILATERAL  . Nasal sinus surgery    . Lumbar fusion      L4-S1  . Cervical fusion    . Hernia repair    . Salpingoophorectomy Right   . Vaginal hysterectomy      TAH w/ ovary removal  . Transthoracic echocardiogram  12-03-2011    LVSF NORMAL/ EF 60-65%  . Vulvectomy N/A 03/02/2013    Procedure: WIDE LOCAL EXCISION VULVAR;  Surgeon: Imagene Gurney A. Alycia Rossetti, MD;  Location: [facility];  Service: Gynecology;  Laterality: N/A;  . Co2 laser application N/A 1/57/2620    Procedure: LASER APPLICATION OF THE VULVA;  Surgeon: Janie Morning, MD;  Location: [facility];  Service: Gynecology;  Laterality: N/A;  . Video bronchoscopy Bilateral 09/23/2013    Procedure: VIDEO BRONCHOSCOPY WITHOUT FLUORO;  Surgeon: Tanda Rockers, MD;   Location: [facility];  Service: Cardiopulmonary;  Laterality: Bilateral;  . Hemiarthroplasty shoulder fracture Left 05/25/2014    DR CHANDLER  . Reverse shoulder arthroplasty Left 05/24/2014    Procedure:   Hemi-Arthroplasty  Left Shoulder;  Surgeon: Nita Sells, MD;  Location: [facility] [facility];  Service: Orthopedics;  Laterality: Left;    REVIEW OF SYSTEMS:  Constitutional: positive for fatigue Eyes: negative Ears, nose, mouth, throat, and face: negative Respiratory: positive for cough, dyspnea on exertion and sputum Cardiovascular: negative Gastrointestinal: negative Genitourinary:negative Integument/breast: negative Hematologic/lymphatic: negative Musculoskeletal:positive for muscle weakness Neurological: negative Behavioral/Psych: negative Endocrine: negative Allergic/Immunologic: negative  PHYSICAL EXAMINATION: General appearance: alert, cooperative, fatigued and no distress Head: Normocephalic, without obvious abnormality, atraumatic Neck: no adenopathy, no JVD, supple, symmetrical, trachea midline and thyroid not enlarged, symmetric, no tenderness/mass/nodules Lymph nodes: Cervical, supraclavicular, and axillary nodes normal. Resp: wheezes bilaterally Back: symmetric, no curvature. ROM normal. No CVA tenderness. Cardio: regular rate and rhythm, S1, S2 normal, no murmur, click, rub or gallop GI: soft, non-tender; bowel sounds normal; no masses,  no organomegaly Extremities: extremities normal, atraumatic, no cyanosis or edema Neurologic: Alert and oriented X 3, normal strength and tone. Normal symmetric reflexes. Normal coordination and gait  ECOG PERFORMANCE STATUS: 2 - Symptomatic, <50% confined to bed  There were no vitals taken for this visit.  LABORATORY DATA: Lab Results  Component Value Date   WBC 2.8* 08/29/2014   HGB 11.4* 08/29/2014   HCT 36.3 08/29/2014   MCV 98.9 08/29/2014   PLT 150 08/29/2014      Chemistry      Component Value Date/Time   NA 140  08/29/2014 1339   NA 138 05/25/2014 0520   K 4.0 08/29/2014 1339   K 4.0 05/25/2014 0520   CL 99 05/25/2014 0520   CO2 29 08/29/2014 1339   CO2 29 05/25/2014 0520   BUN 5.7* 08/29/2014 1339   BUN 7 05/25/2014 0520   CREATININE 0.8 08/29/2014 1339   CREATININE 0.64 05/25/2014 0520      Component Value Date/Time   CALCIUM 8.5 08/29/2014 1339   CALCIUM 8.0* 05/25/2014 0520   ALKPHOS 113 08/29/2014 1339   ALKPHOS 146* 04/20/2014 1108   AST 14 08/29/2014 1339   AST 15 04/20/2014 1108   ALT 9 08/29/2014 1339   ALT 7 04/20/2014 1108   BILITOT <0.20 08/29/2014 1339   BILITOT 0.2* 04/20/2014 1108       RADIOGRAPHIC STUDIES: Ct Chest W Contrast  09/15/2014   CLINICAL DATA:  Locally advanced non-small cell lung cancer. Squamous cell carcinoma diagnosed 2014. Stage IIIA.  EXAM: CT CHEST, ABDOMEN, AND PELVIS WITH CONTRAST  TECHNIQUE: Multidetector CT imaging of the chest, abdomen and pelvis was performed following the standard protocol during bolus administration of intravenous contrast.  CONTRAST:  174mL OMNIPAQUE IOHEXOL 300 MG/ML  SOLN  COMPARISON:  03/25/2014, CT  FINDINGS: CT CHEST FINDINGS  There is no axillary or supraclavicular lymphadenopathy. No mediastinal or hilar lymphadenopathy. Paratracheal lymph nodes are similar in size to prior. For example 8 mm short axis right lower paratracheal lymph node is unchanged from 10 mm on prior. 9 mm right lower paratracheal node compares to 10 mm on prior. No new lymph nodes are identified. No pericardial fluid. Esophagus is normal.  Right infrahilar nodularity measures 17 mm (image 36, series 2) which is decreased from 39 mm on prior.  Within the right upper lobe there is a new nodule with surrounding ground-glass opacity. The nodularar portion measures 12 mm on image 24 of series 4. There is scattered nodularity peripheral to this larger nodule within the right upper lobe on image 19 and 21.  Interval improvement in right pleural effusion.  CT ABDOMEN AND PELVIS FINDINGS   Hepatobiliary: Within the superior aspect of the left lateral hepatic lobe there is a 11 mm hypodense lesion (image 51 of series 2) which is not present on comparison exams. Gallbladder is normal. The common bile duct is mildly dilated to 8 mm.  Spleen: normal  Pancreas: Pancreas is normal. No duct dilatation. No pancreatic inflammation.  Stomach/Bowel: Stomach, small bowel, and colon are normal.  Adrenals/urinary tract: Adrenal glands and kidneys are normal.  Vascular/Lymphatic: The infrarenal abdominal aorta is mildly dilated to is 26 mm. There is heavy interval calcification. No retroperitoneal periportal lymphadenopathy. No mesenteric peritoneal metastasis. No pelvic lymphadenopathy.  Reproductive: Post hysterectomy anatomy.  Next are normal.  Musculoskeletal: No aggressive osseous lesion. Degenerate changes of the hips. Postsurgical change in the lower thoracic spine and sacrum.  Other: Ascites  IMPRESSION: Chest Impression:  1. New right upper lobe pulmonary nodule is a concerning finding which could represent lung cancer metastasis . A pulmonary infection would be in the differential. Recommend followup short-term CT (4 weeks) and if lesion persists consider tissue sampling. 2. Interval decrease in size of right infrahilar mass. 3. Stable borderline enlarged mediastinal lymph nodes.  Abdomen / Pelvis Impression:  1. Hypodense lesion in the left hepatic lobe is not seen on prior exams and is also concerning finding for metastasis. This could be further evaluated with contrast enhanced abdominal MRI or FDG PET scan. 2. No metastatic adenopathy in the abdomen or pelvis.   Electronically Signed   By: Suzy Bouchard M.D.   On: 09/15/2014 16:16   Ct Abdomen Pelvis W Contrast  09/15/2014   CLINICAL DATA:  Locally advanced non-small cell lung cancer. Squamous cell carcinoma diagnosed 2014. Stage IIIA.  EXAM: CT CHEST, ABDOMEN, AND PELVIS WITH CONTRAST  TECHNIQUE: Multidetector CT imaging of the chest, abdomen  and pelvis was performed following the standard protocol during bolus administration of intravenous contrast.  CONTRAST:  149mL OMNIPAQUE IOHEXOL 300 MG/ML  SOLN  COMPARISON:  03/25/2014, CT  FINDINGS: CT CHEST FINDINGS  There is no axillary or supraclavicular lymphadenopathy. No mediastinal or hilar lymphadenopathy. Paratracheal lymph nodes are similar in size to prior. For example 8 mm short axis right lower paratracheal lymph node is unchanged from 10 mm on prior. 9 mm right lower paratracheal node compares to 10 mm on prior. No new lymph nodes are identified. No pericardial fluid. Esophagus is normal.  Right infrahilar nodularity measures 17 mm (image 36, series 2) which is decreased from 39 mm on prior.  Within the right upper lobe there is a new nodule with surrounding ground-glass opacity. The nodularar portion measures 12 mm on image 24 of series 4. There is scattered nodularity peripheral to this larger nodule within the right upper lobe on image 19 and 21.  Interval improvement in right pleural effusion.  CT ABDOMEN AND PELVIS FINDINGS  Hepatobiliary: Within the superior aspect of the left lateral hepatic lobe there is a 11 mm hypodense lesion (image 51 of series 2) which is not present on comparison exams. Gallbladder is normal. The common bile duct is mildly dilated to 8 mm.  Spleen: normal  Pancreas: Pancreas is normal. No duct dilatation. No pancreatic inflammation.  Stomach/Bowel: Stomach, small bowel, and colon are normal.  Adrenals/urinary tract: Adrenal glands and kidneys are normal.  Vascular/Lymphatic: The infrarenal abdominal aorta is mildly dilated to is 26 mm. There is heavy interval calcification. No retroperitoneal periportal lymphadenopathy. No mesenteric peritoneal metastasis. No pelvic lymphadenopathy.  Reproductive: Post hysterectomy anatomy.  Next are normal.  Musculoskeletal: No aggressive osseous lesion. Degenerate changes of the hips. Postsurgical change in the lower thoracic spine  and sacrum.  Other: Ascites  IMPRESSION: Chest Impression:  1. New right upper lobe pulmonary nodule is a concerning finding which could represent lung cancer metastasis . A pulmonary infection would be in the differential. Recommend followup short-term CT (4 weeks) and if lesion persists consider tissue sampling. 2.  Interval decrease in size of right infrahilar mass. 3. Stable borderline enlarged mediastinal lymph nodes.  Abdomen / Pelvis Impression:  1. Hypodense lesion in the left hepatic lobe is not seen on prior exams and is also concerning finding for metastasis. This could be further evaluated with contrast enhanced abdominal MRI or FDG PET scan. 2. No metastatic adenopathy in the abdomen or pelvis.   Electronically Signed   By: Suzy Bouchard M.D.   On: 09/15/2014 16:16   ASSESSMENT AND PLAN: This is a very pleasant 67 years old white female with stage IIIA non-small cell lung cancer completed a course of concurrent chemoradiation with weekly carboplatin and paclitaxel and tolerating her treatment fairly well. She completed 2 more cycles of systemic chemotherapy with carboplatin and paclitaxel every 3 weeks but her treatment was discontinued after the patient had surgery for the left shoulder and has been observation for the last 3 months. The recent CT scan of the chest, abdomen and pelvis showed new right upper lobe pulmonary nodule concerning for metastases in addition to suspicious lesion in the left hepatic lobe but there was improvement in the right infrahilar mass as well as the mediastinal lymphadenopathy. I discussed the scan results with the patient and her aunt today. I gave her several options for management of her condition including palliative care and hospice referral versus close monitoring versus treatment with immunotherapy with single agent Nivolumab. The patient is interested in treatment with Nivolumab. She will be treated at a dose of 3 mg/KG every 2 weeks. I discussed the  adverse effect of this treatment with the patient including but not limited to diarrhea, skin rash, endocrine dysfunction, liver or renal dysfunction. She is expected to start the first cycle of this treatment next week. The patient would come back for followup visit in 3 weeks with the start of cycle #2. She was advised to call immediately if she has any concerning symptoms in the interval. The patient voices understanding of current disease status and treatment options and is in agreement with the current care plan.  All questions were answered. The patient knows to call the clinic with any problems, questions or concerns. We can certainly see the patient much sooner if necessary.  Disclaimer: This note was dictated with voice recognition software. Similar sounding words can inadvertently be transcribed and may not be corrected upon review.

## 2014-09-20 NOTE — Telephone Encounter (Signed)
m °

## 2014-09-20 NOTE — Patient Instructions (Signed)
Smoking Cessation, Tips for Success  If you are ready to quit smoking, congratulations! You have chosen to help yourself be healthier. Cigarettes bring nicotine, tar, carbon monoxide, and other irritants into your body. Your lungs, heart, and blood vessels will be able to work better without these poisons. There are many different ways to quit smoking. Nicotine gum, nicotine patches, a nicotine inhaler, or nicotine nasal spray can help with physical craving. Hypnosis, support groups, and medicines help break the habit of smoking.  WHAT THINGS CAN I DO TO MAKE QUITTING EASIER?   Here are some tips to help you quit for good:  · Pick a date when you will quit smoking completely. Tell all of your friends and family about your plan to quit on that date.  · Do not try to slowly cut down on the number of cigarettes you are smoking. Pick a quit date and quit smoking completely starting on that day.  · Throw away all cigarettes.    · Clean and remove all ashtrays from your home, work, and car.  · On a card, write down your reasons for quitting. Carry the card with you and read it when you get the urge to smoke.  · Cleanse your body of nicotine. Drink enough water and fluids to keep your urine clear or pale yellow. Do this after quitting to flush the nicotine from your body.  · Learn to predict your moods. Do not let a bad situation be your excuse to have a cigarette. Some situations in your life might tempt you into wanting a cigarette.  · Never have "just one" cigarette. It leads to wanting another and another. Remind yourself of your decision to quit.  · Change habits associated with smoking. If you smoked while driving or when feeling stressed, try other activities to replace smoking. Stand up when drinking your coffee. Brush your teeth after eating. Sit in a different chair when you read the paper. Avoid alcohol while trying to quit, and try to drink fewer caffeinated beverages. Alcohol and caffeine may urge you to  smoke.  · Avoid foods and drinks that can trigger a desire to smoke, such as sugary or spicy foods and alcohol.  · Ask people who smoke not to smoke around you.  · Have something planned to do right after eating or having a cup of coffee. For example, plan to take a walk or exercise.  · Try a relaxation exercise to calm you down and decrease your stress. Remember, you may be tense and nervous for the first 2 weeks after you quit, but this will pass.  · Find new activities to keep your hands busy. Play with a pen, coin, or rubber band. Doodle or draw things on paper.  · Brush your teeth right after eating. This will help cut down on the craving for the taste of tobacco after meals. You can also try mouthwash.    · Use oral substitutes in place of cigarettes. Try using lemon drops, carrots, cinnamon sticks, or chewing gum. Keep them handy so they are available when you have the urge to smoke.  · When you have the urge to smoke, try deep breathing.  · Designate your home as a nonsmoking area.  · If you are a heavy smoker, ask your health care provider about a prescription for nicotine chewing gum. It can ease your withdrawal from nicotine.  · Reward yourself. Set aside the cigarette money you save and buy yourself something nice.  · Look for   support from others. Join a support group or smoking cessation program. Ask someone at home or at work to help you with your plan to quit smoking.  · Always ask yourself, "Do I need this cigarette or is this just a reflex?" Tell yourself, "Today, I choose not to smoke," or "I do not want to smoke." You are reminding yourself of your decision to quit.  · Do not replace cigarette smoking with electronic cigarettes (commonly called e-cigarettes). The safety of e-cigarettes is unknown, and some may contain harmful chemicals.  · If you relapse, do not give up! Plan ahead and think about what you will do the next time you get the urge to smoke.  HOW WILL I FEEL WHEN I QUIT SMOKING?  You  may have symptoms of withdrawal because your body is used to nicotine (the addictive substance in cigarettes). You may crave cigarettes, be irritable, feel very hungry, cough often, get headaches, or have difficulty concentrating. The withdrawal symptoms are only temporary. They are strongest when you first quit but will go away within 10-14 days. When withdrawal symptoms occur, stay in control. Think about your reasons for quitting. Remind yourself that these are signs that your body is healing and getting used to being without cigarettes. Remember that withdrawal symptoms are easier to treat than the major diseases that smoking can cause.   Even after the withdrawal is over, expect periodic urges to smoke. However, these cravings are generally short lived and will go away whether you smoke or not. Do not smoke!  WHAT RESOURCES ARE AVAILABLE TO HELP ME QUIT SMOKING?  Your health care provider can direct you to community resources or hospitals for support, which may include:  · Group support.  · Education.  · Hypnosis.  · Therapy.  Document Released: 09/13/2004 Document Revised: 05/02/2014 Document Reviewed: 06/03/2013  [employer]® Patient Information ©2015 [employer]. This information is not intended to replace advice given to you by your health care provider. Make sure you discuss any questions you have with your health care provider.

## 2014-09-27 ENCOUNTER — Other Ambulatory Visit (HOSPITAL_BASED_OUTPATIENT_CLINIC_OR_DEPARTMENT_OTHER): Payer: Medicare Other

## 2014-09-27 ENCOUNTER — Ambulatory Visit (HOSPITAL_BASED_OUTPATIENT_CLINIC_OR_DEPARTMENT_OTHER): Payer: Medicare Other

## 2014-09-27 VITALS — BP 113/55 | HR 70 | Temp 97.8°F | Resp 16

## 2014-09-27 DIAGNOSIS — C342 Malignant neoplasm of middle lobe, bronchus or lung: Secondary | ICD-10-CM

## 2014-09-27 DIAGNOSIS — C3491 Malignant neoplasm of unspecified part of right bronchus or lung: Secondary | ICD-10-CM

## 2014-09-27 DIAGNOSIS — Z5112 Encounter for antineoplastic immunotherapy: Secondary | ICD-10-CM

## 2014-09-27 DIAGNOSIS — I10 Essential (primary) hypertension: Secondary | ICD-10-CM

## 2014-09-27 DIAGNOSIS — C3430 Malignant neoplasm of lower lobe, unspecified bronchus or lung: Secondary | ICD-10-CM

## 2014-09-27 DIAGNOSIS — R5383 Other fatigue: Secondary | ICD-10-CM

## 2014-09-27 LAB — COMPREHENSIVE METABOLIC PANEL (CC13)
ALBUMIN: 3 g/dL — AB (ref 3.5–5.0)
ALK PHOS: 116 U/L (ref 40–150)
ALT: 9 U/L (ref 0–55)
AST: 19 U/L (ref 5–34)
Anion Gap: 9 mEq/L (ref 3–11)
BUN: 8.1 mg/dL (ref 7.0–26.0)
CHLORIDE: 102 meq/L (ref 98–109)
CO2: 29 mEq/L (ref 22–29)
Calcium: 8.9 mg/dL (ref 8.4–10.4)
Creatinine: 0.8 mg/dL (ref 0.6–1.1)
Glucose: 97 mg/dl (ref 70–140)
POTASSIUM: 3.6 meq/L (ref 3.5–5.1)
SODIUM: 140 meq/L (ref 136–145)
Total Bilirubin: 0.2 mg/dL (ref 0.20–1.20)
Total Protein: 6.9 g/dL (ref 6.4–8.3)

## 2014-09-27 LAB — TSH CHCC: TSH: 6.751 m[IU]/L — AB (ref 0.308–3.960)

## 2014-09-27 LAB — CBC WITH DIFFERENTIAL/PLATELET
BASO%: 0.4 % (ref 0.0–2.0)
Basophils Absolute: 0 10*3/uL (ref 0.0–0.1)
EOS%: 3 % (ref 0.0–7.0)
Eosinophils Absolute: 0.1 10*3/uL (ref 0.0–0.5)
HCT: 34.9 % (ref 34.8–46.6)
HEMOGLOBIN: 11.2 g/dL — AB (ref 11.6–15.9)
LYMPH#: 0.7 10*3/uL — AB (ref 0.9–3.3)
LYMPH%: 25.8 % (ref 14.0–49.7)
MCH: 31 pg (ref 25.1–34.0)
MCHC: 32.1 g/dL (ref 31.5–36.0)
MCV: 96.7 fL (ref 79.5–101.0)
MONO#: 0.1 10*3/uL (ref 0.1–0.9)
MONO%: 5.2 % (ref 0.0–14.0)
NEUT#: 1.8 10*3/uL (ref 1.5–6.5)
NEUT%: 65.6 % (ref 38.4–76.8)
Platelets: ADEQUATE 10*3/uL (ref 145–400)
RBC: 3.61 10*6/uL — ABNORMAL LOW (ref 3.70–5.45)
RDW: 13.9 % (ref 11.2–14.5)
WBC: 2.7 10*3/uL — AB (ref 3.9–10.3)
nRBC: 0 % (ref 0–0)

## 2014-09-27 MED ORDER — NIVOLUMAB CHEMO INJECTION 100 MG/10ML
200.0000 mg | Freq: Once | INTRAVENOUS | Status: AC
  Administered 2014-09-27: 200 mg via INTRAVENOUS
  Filled 2014-09-27: qty 20

## 2014-09-27 MED ORDER — SODIUM CHLORIDE 0.9 % IV SOLN
Freq: Once | INTRAVENOUS | Status: AC
  Administered 2014-09-27: 15:00:00 via INTRAVENOUS

## 2014-09-27 NOTE — Patient Instructions (Signed)
[facility] Discharge Instructions for Patients Receiving Chemotherapy  Today you received the following chemotherapy agents nivolumab.   To help prevent nausea and vomiting after your treatment, we encourage you to take your nausea medication as directed.    If you develop nausea and vomiting that is not controlled by your nausea medication, call the clinic.   BELOW ARE SYMPTOMS THAT SHOULD BE REPORTED IMMEDIATELY:  *FEVER GREATER THAN 100.5 F  *CHILLS WITH OR WITHOUT FEVER  NAUSEA AND VOMITING THAT IS NOT CONTROLLED WITH YOUR NAUSEA MEDICATION  *UNUSUAL SHORTNESS OF BREATH  *UNUSUAL BRUISING OR BLEEDING  TENDERNESS IN MOUTH AND THROAT WITH OR WITHOUT PRESENCE OF ULCERS  *URINARY PROBLEMS  *BOWEL PROBLEMS  UNUSUAL RASH Items with * indicate a potential emergency and should be followed up as soon as possible.  Feel free to call the clinic you have any questions or concerns. The clinic phone number is [phone].  Nivolumab injection What is this medicine? NIVOLUMAB (nye VOL ue mab) is used to treat certain types of melanoma and lung cancer. This medicine may be used for other purposes; ask your health care provider or pharmacist if you have questions. COMMON BRAND NAME(S): Opdivo What should I tell my health care provider before I take this medicine? They need to know if you have any of these conditions: -eye disease, vision problems -history of pancreatitis -immune system problems -inflammatory bowel disease -kidney disease -liver disease -lung disease -lupus -myasthenia gravis -multiple sclerosis -organ transplant -stomach or intestine problems -thyroid disease -tingling of the fingers or toes, or other nerve disorder -an unusual or allergic reaction to nivolumab, other medicines, foods, dyes, or preservatives -pregnant or trying to get pregnant -breast-feeding How should I use this medicine? This medicine is for infusion into a vein. It is  given by a health care professional in a hospital or clinic setting. A special MedGuide will be given to you before each treatment. Be sure to read this information carefully each time. Talk to your pediatrician regarding the use of this medicine in children. Special care may be needed. Overdosage: If you think you've taken too much of this medicine contact a poison control center or emergency room at once. Overdosage: If you think you have taken too much of this medicine contact a poison control center or emergency room at once. NOTE: This medicine is only for you. Do not share this medicine with others. What if I miss a dose? It is important not to miss your dose. Call your doctor or health care professional if you are unable to keep an appointment. What may interact with this medicine? Interactions have not been studied. This list may not describe all possible interactions. Give your health care provider a list of all the medicines, herbs, non-prescription drugs, or dietary supplements you use. Also tell them if you smoke, drink alcohol, or use illegal drugs. Some items may interact with your medicine. What should I watch for while using this medicine? Tell your doctor or healthcare professional if your symptoms do not start to get better or if they get worse. Your condition will be monitored carefully while you are receiving this medicine. You may need blood work done while you are taking this medicine. What side effects may I notice from receiving this medicine? Side effects that you should report to your doctor or health care professional as soon as possible: -allergic reactions like skin rash, itching or hives, swelling of the face, lips, or tongue -black, tarry stools -  bloody or watery diarrhea -changes in vision -chills -cough -depressed mood -eye pain -feeling anxious -fever -general ill feeling or flu-like symptoms -hair loss -loss of appetite -low blood counts - this medicine  may decrease the number of white blood cells, red blood cells and platelets. You may be at increased risk for infections and bleeding -pain, tingling, numbness in the hands or feet -redness, blistering, peeling or loosening of the skin, including inside the mouth -red pinpoint spots on skin -signs of decreased platelets or bleeding - bruising, pinpoint red spots on the skin, black, tarry stools, blood in the urine -signs of decreased red blood cells - unusually weak or tired, feeling faint or lightheaded, falls -signs of infection - fever or chills, cough, sore throat, pain or trouble passing urine -signs and symptoms of a dangerous change in heartbeat or heart rhythm like chest pain; dizziness; fast or irregular heartbeat; palpitations; feeling faint or lightheaded, falls; breathing problems -signs and symptoms of high blood sugar such as dizziness; dry mouth; dry skin; fruity breath; nausea; stomach pain; increased hunger or thirst; increased urination -signs and symptoms of kidney injury like trouble passing urine or change in the amount of urine -signs and symptoms of liver injury like dark yellow or brown urine; general ill feeling or flu-like symptoms; light-colored stools; loss of appetite; nausea; right upper belly pain; unusually weak or tired; yellowing of the eyes or skin -signs and symptoms of increased potassium like muscle weakness; chest pain; or fast, irregular heartbeat -signs and symptoms of low potassium like muscle cramps or muscle pain; chest pain; dizziness; feeling faint or lightheaded, falls; palpitations; breathing problems; or fast, irregular heartbeat -swelling of the ankles, feet, hands -weight gainSide effects that usually do not require medical attention (report to your doctor or health care professional if they continue or are bothersome): -constipation -general ill feeling or flu-like symptoms -hair loss -loss of appetite -nausea, vomiting This list may not  describe all possible side effects. Call your doctor for medical advice about side effects. You may report side effects to FDA at 1-800-FDA-1088. Where should I keep my medicine? This drug is given in a hospital or clinic and will not be stored at home. NOTE: This sheet is a summary. It may not cover all possible information. If you have questions about this medicine, talk to your doctor, pharmacist, or health care provider.  2015, [employer]vier/Gold Standard. (2014-03-07 13:18:19)

## 2014-09-27 NOTE — Progress Notes (Signed)
Platelets WNL per lab.  Ok to treat

## 2014-09-28 ENCOUNTER — Telehealth: Payer: Self-pay | Admitting: *Deleted

## 2014-09-28 NOTE — Telephone Encounter (Signed)
Called pt to discuss f/u from nivolumab treatment done 09/27/14.  Pt reports that she is mopping & denies any side effects at this time.  She knows she can call for questions for concerns.

## 2014-09-28 NOTE — Telephone Encounter (Signed)
Message copied by Jesse Fall on Wed Sep 28, 2014  2:26 PM ------      Message from: Arty Baumgartner      Created: Tue Sep 27, 2014  3:08 PM      Regarding: first time chemo       First time nivolumab.  Dr Julien Nordmann.  [phone] ------

## 2014-10-11 ENCOUNTER — Encounter: Payer: Self-pay | Admitting: Physician Assistant

## 2014-10-11 ENCOUNTER — Ambulatory Visit (HOSPITAL_BASED_OUTPATIENT_CLINIC_OR_DEPARTMENT_OTHER): Payer: Medicare Other | Admitting: Physician Assistant

## 2014-10-11 ENCOUNTER — Telehealth: Payer: Self-pay | Admitting: Medical Oncology

## 2014-10-11 ENCOUNTER — Other Ambulatory Visit (HOSPITAL_BASED_OUTPATIENT_CLINIC_OR_DEPARTMENT_OTHER): Payer: Medicare Other

## 2014-10-11 ENCOUNTER — Ambulatory Visit (HOSPITAL_BASED_OUTPATIENT_CLINIC_OR_DEPARTMENT_OTHER): Payer: Medicare Other

## 2014-10-11 VITALS — BP 121/62 | HR 85 | Temp 98.6°F | Resp 18 | Ht 66.0 in | Wt 157.0 lb

## 2014-10-11 DIAGNOSIS — C3490 Malignant neoplasm of unspecified part of unspecified bronchus or lung: Secondary | ICD-10-CM

## 2014-10-11 DIAGNOSIS — C3491 Malignant neoplasm of unspecified part of right bronchus or lung: Secondary | ICD-10-CM

## 2014-10-11 DIAGNOSIS — I10 Essential (primary) hypertension: Secondary | ICD-10-CM

## 2014-10-11 DIAGNOSIS — C3431 Malignant neoplasm of lower lobe, right bronchus or lung: Secondary | ICD-10-CM

## 2014-10-11 DIAGNOSIS — C342 Malignant neoplasm of middle lobe, bronchus or lung: Secondary | ICD-10-CM

## 2014-10-11 DIAGNOSIS — M549 Dorsalgia, unspecified: Secondary | ICD-10-CM

## 2014-10-11 DIAGNOSIS — Z5112 Encounter for antineoplastic immunotherapy: Secondary | ICD-10-CM

## 2014-10-11 LAB — CBC WITH DIFFERENTIAL/PLATELET
BASO%: 1.3 % (ref 0.0–2.0)
Basophils Absolute: 0 10*3/uL (ref 0.0–0.1)
EOS%: 2.2 % (ref 0.0–7.0)
Eosinophils Absolute: 0.1 10*3/uL (ref 0.0–0.5)
HCT: 38.6 % (ref 34.8–46.6)
HGB: 12.4 g/dL (ref 11.6–15.9)
LYMPH%: 14.7 % (ref 14.0–49.7)
MCH: 30.9 pg (ref 25.1–34.0)
MCHC: 32 g/dL (ref 31.5–36.0)
MCV: 96.5 fL (ref 79.5–101.0)
MONO#: 0.1 10*3/uL (ref 0.1–0.9)
MONO%: 3.4 % (ref 0.0–14.0)
NEUT#: 3 10*3/uL (ref 1.5–6.5)
NEUT%: 78.4 % — ABNORMAL HIGH (ref 38.4–76.8)
PLATELETS: 179 10*3/uL (ref 145–400)
RBC: 4 10*6/uL (ref 3.70–5.45)
RDW: 15.1 % — ABNORMAL HIGH (ref 11.2–14.5)
WBC: 3.8 10*3/uL — ABNORMAL LOW (ref 3.9–10.3)
lymph#: 0.6 10*3/uL — ABNORMAL LOW (ref 0.9–3.3)

## 2014-10-11 LAB — COMPREHENSIVE METABOLIC PANEL (CC13)
ALK PHOS: 125 U/L (ref 40–150)
ALT: 11 U/L (ref 0–55)
AST: 17 U/L (ref 5–34)
Albumin: 3.4 g/dL — ABNORMAL LOW (ref 3.5–5.0)
Anion Gap: 12 mEq/L — ABNORMAL HIGH (ref 3–11)
BILIRUBIN TOTAL: 0.4 mg/dL (ref 0.20–1.20)
BUN: 6.5 mg/dL — ABNORMAL LOW (ref 7.0–26.0)
CO2: 24 mEq/L (ref 22–29)
Calcium: 9 mg/dL (ref 8.4–10.4)
Chloride: 103 mEq/L (ref 98–109)
Creatinine: 0.9 mg/dL (ref 0.6–1.1)
Glucose: 172 mg/dl — ABNORMAL HIGH (ref 70–140)
Potassium: 3.4 mEq/L — ABNORMAL LOW (ref 3.5–5.1)
Sodium: 138 mEq/L (ref 136–145)
TOTAL PROTEIN: 7.1 g/dL (ref 6.4–8.3)

## 2014-10-11 LAB — TSH CHCC: TSH: 2.364 m[IU]/L (ref 0.308–3.960)

## 2014-10-11 MED ORDER — NIVOLUMAB CHEMO INJECTION 100 MG/10ML
3.0000 mg/kg | Freq: Once | INTRAVENOUS | Status: AC
  Administered 2014-10-11: 220 mg via INTRAVENOUS
  Filled 2014-10-11: qty 22

## 2014-10-11 MED ORDER — SODIUM CHLORIDE 0.9 % IV SOLN
Freq: Once | INTRAVENOUS | Status: AC
  Administered 2014-10-11: 16:00:00 via INTRAVENOUS

## 2014-10-11 NOTE — Telephone Encounter (Signed)
requests " Pain med and muscle relaxer-parafon Forte". PCP recommended she go to pain clinic and pt said it will take too long to get appt. She is having back pain. Has appt today with Adrena

## 2014-10-11 NOTE — Patient Instructions (Signed)
[facility] Discharge Instructions for Patients Receiving Chemotherapy  Today you received the following chemotherapy agents: Nivolumab  To help prevent nausea and vomiting after your treatment, we encourage you to take your nausea medication as prescribed by your physician.   If you develop nausea and vomiting that is not controlled by your nausea medication, call the clinic.   BELOW ARE SYMPTOMS THAT SHOULD BE REPORTED IMMEDIATELY:  *FEVER GREATER THAN 100.5 F  *CHILLS WITH OR WITHOUT FEVER  NAUSEA AND VOMITING THAT IS NOT CONTROLLED WITH YOUR NAUSEA MEDICATION  *UNUSUAL SHORTNESS OF BREATH  *UNUSUAL BRUISING OR BLEEDING  TENDERNESS IN MOUTH AND THROAT WITH OR WITHOUT PRESENCE OF ULCERS  *URINARY PROBLEMS  *BOWEL PROBLEMS  UNUSUAL RASH Items with * indicate a potential emergency and should be followed up as soon as possible.  Feel free to call the clinic you have any questions or concerns. The clinic phone number is [phone].

## 2014-10-11 NOTE — Progress Notes (Addendum)
[facility] [phone]   [phone]  SHARED VISIT PROGRESS NOTE  [name], MD  [place] [address]  DIAGNOSIS: Metastatic non-small cell lung cancer initially diagnosed as Locally advanced, likely stage IIIA (T2a., N2, M0) non-small cell lung cancer, squamous cell carcinoma diagnosed in September of 2014.   PRIOR THERAPY:  1) Concurrent chemoradiation with weekly carboplatin for AUC of 2 and paclitaxel 45 mg/M2, status post 7 cycles, last cycle was given 11/29/2013 with partial response. First dose was given on 10/18/2013.   2) Systemic chemotherapy with carboplatin for AUC of 5 and paclitaxel 175 mg/M2 every 3 weeks with Neulasta support. First cycle on 04/06/2014. Status post 2 cycles.  CURRENT THERAPY: Immunotherapy with Nivolumab 3 mg/KG every 2 weeks. First cycle 09/27/2014. Status post 1 cycle  CHEMOTHERAPY INTENT: Curative/control  CURRENT # OF CHEMOTHERAPY CYCLES: 2 CURRENT ANTIEMETICS: Zofran, Compazine and dexamethasone  CURRENT SMOKING STATUS: Current smoker and she was strongly advised to quit smoking and also to smoke cessation program  ORAL CHEMOTHERAPY AND CONSENT: None  CURRENT BISPHOSPHONATES USE: None  PAIN MANAGEMENT: 0/10  NARCOTICS INDUCED CONSTIPATION: None  LIVING WILL AND CODE STATUS: Full code  INTERVAL HISTORY: SHINIQUA GROSECLOSE 68 y.o. female returns to the clinic today for follow up visit accompanied by her Aunt. The patient continues to complain of aching pain all over especially in the shoulder area after her recent films and arthroplasty of the shoulder. She is currently on Norco 5/325 mg 1-2 tablets every 6 hours but her pain is not well controlled. She is also on a 25 mcg fentanyl patch or 3 days as well as Parafon 500 mg every 6 hours as needed for muscle. She's been on these medications for extended period of time due to history of right placement in the spine and the neck as well as various  plates and screws. She states she's been on the Parafon for at least 30 years. Her primary care physician was prescribing these medications however patient tells me that he has referred her to a pain management clinic however she does not have a return appointment. She ran out of her Norco tablets on Thursday. She complains of weakness. She also has discomfort between her breasts the past 2-3 days of cough that is productive of white secretions, not associated with any fever or chills. She tolerated her first cycle of Nivolumab without difficulty. She denied any issues with change in her baseline shortness of breath, diarrhea or skin rash. She denied having any significant chest pain but continues to have shortness of breath with exertion with no hemoptysis. She denied having any significant weight loss or night sweats.   MEDICAL HISTORY: Past Medical History  Diagnosis Date  . H/O: stroke 2012, 2000,     X3  . Hypertension   . Anxiety   . PTSD (post-traumatic stress disorder)   . Depression   . SVD (spontaneous vaginal delivery)     x 1  . Smoker   . Osteoarthritis     knees, hips hands,   . Shortness of breath   . Pain     SEVERE PAIN BACK, RIGHT HIP AND KNEES--HARRINGTON RODS AND CERVICAL PLATES--USES WALKER OR CANE WHEN AMBULATING - GOES TO A PAIN CLINIC-STATES SHE NEEDS RT HIP AND BOTH KNEES REPLACED. PT STATES SHE WAS TOLD THE CEMENT AROUND THE HARRINGTON RODS IS CRACKED.  [name] Hot flashes, menopausal     SEVERE  .  Complication of anesthesia 03/02/13    severe headache,vertigo postop  . COPD (chronic obstructive pulmonary disease)     stable  . 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 Lung cancer   . Status post radiation therapy within four to twelve weeks 10/18/13-12/06/13    lung ca  66Gy  . Family history of anesthesia complication     equipment failure caused problem, suffered a stroke & heartattack    ALLERGIES:  is allergic to carafate; prednisone; nsaids; aspirin; ibuprofen; and  dulera.  MEDICATIONS:  Current Outpatient Prescriptions  Medication Sig Dispense Refill  . Aclidinium Bromide (TUDORZA PRESSAIR) 400 MCG/ACT AEPB Inhale 1 puff into the lungs 2 (two) times daily.      [name] albuterol (PROVENTIL) (2.5 MG/3ML) 0.083% nebulizer solution Take 2.5 mg by nebulization as needed for wheezing.      [name] albuterol (VENTOLIN HFA) 108 (90 BASE) MCG/ACT inhaler Inhale 2 puffs into the lungs every 6 (six) hours as needed for wheezing or shortness of breath.  1 Inhaler  11  . ALPRAZolam (XANAX) 1 MG tablet Take 1 tablet (1 mg total) by mouth every 6 (six) hours as needed for anxiety.  30 tablet  0  . atenolol (TENORMIN) 50 MG tablet Take 1 tablet (50 mg total) by mouth daily.  30 tablet  0  . dextromethorphan-guaiFENesin (MUCINEX DM) 30-600 MG per 12 hr tablet Take 1 tablet by mouth every 12 (twelve) hours.      [name] esomeprazole (NEXIUM) 40 MG capsule Take 40 mg by mouth daily as needed.       . fentaNYL (DURAGESIC - DOSED MCG/HR) 25 MCG/HR patch Place 25 mcg onto the skin every 3 (three) days.      . ondansetron (ZOFRAN ODT) 4 MG disintegrating tablet Take 1 tablet (4 mg total) by mouth every 8 (eight) hours as needed for nausea or vomiting.  20 tablet  0  . promethazine (PHENERGAN) 25 MG tablet Take 25 mg by mouth every 6 (six) hours as needed for nausea or vomiting.      . sertraline (ZOLOFT) 100 MG tablet Take 200 mg by mouth daily.       . chlorzoxazone (PARAFON) 500 MG tablet Take 500 mg by mouth 4 (four) times daily as needed for muscle spasms.      [name] HYDROcodone-acetaminophen (NORCO) 5-325 MG per tablet Take 1 tablet by mouth every 6 (six) hours as needed for moderate pain.  30 tablet  0  . trazodone (DESYREL) 300 MG tablet Take 300 mg by mouth at bedtime.       No current facility-administered medications for this visit.    SURGICAL HISTORY:  Past Surgical History  Procedure Laterality Date  . Carpal tunnel release      BILATERAL  . Nasal sinus surgery    . Lumbar  fusion      L4-S1  . Cervical fusion    . Hernia repair    . Salpingoophorectomy Right   . Vaginal hysterectomy      TAH w/ ovary removal  . Transthoracic echocardiogram  12-03-2011    LVSF NORMAL/ EF 60-65%  . Vulvectomy N/A 03/02/2013    Procedure: WIDE LOCAL EXCISION VULVAR;  Surgeon: Imagene Gurney A. Alycia Rossetti, MD;  Location: [facility];  Service: Gynecology;  Laterality: N/A;  . Co2 laser application N/A 3/95/3202    Procedure: LASER APPLICATION OF THE VULVA;  Surgeon: Janie Morning, MD;  Location[facility];  Service: Gynecology;  Laterality: N/A;  .  Video bronchoscopy Bilateral 09/23/2013    Procedure: VIDEO BRONCHOSCOPY WITHOUT FLUORO;  Surgeon: Tanda Rockers, MD;  Location: [facility];  Service: Cardiopulmonary;  Laterality: Bilateral;  . Hemiarthroplasty shoulder fracture Left 05/25/2014    DR CHANDLER  . Reverse shoulder arthroplasty Left 05/24/2014    Procedure:   Hemi-Arthroplasty  Left Shoulder;  Surgeon: Nita Sells, MD;  Location: [facility];  Service: Orthopedics;  Laterality: Left;    REVIEW OF SYSTEMS:  Constitutional: positive for fatigue and malaise Eyes: negative Ears, nose, mouth, throat, and face: negative Respiratory: positive for cough, dyspnea on exertion and sputum Cardiovascular: negative Gastrointestinal: negative Genitourinary:negative Integument/breast: negative Hematologic/lymphatic: negative Musculoskeletal:positive for bone pain, muscle weakness, myalgias and muscle spasms, long standing issue Neurological: negative Behavioral/Psych: negative Endocrine: negative Allergic/Immunologic: negative   PHYSICAL EXAMINATION: General appearance: alert, cooperative, fatigued and no distress Head: Normocephalic, without obvious abnormality, atraumatic Neck: no adenopathy, no JVD, supple, symmetrical, trachea midline and thyroid not enlarged, symmetric, no tenderness/mass/nodules Lymph nodes: Cervical, supraclavicular, and axillary nodes  normal. Resp: wheezes bilaterally Back: symmetric, no curvature. ROM normal. No CVA tenderness. Cardio: regular rate and rhythm, S1, S2 normal, no murmur, click, rub or gallop GI: soft, non-tender; bowel sounds normal; no masses,  no organomegaly Extremities: extremities normal, atraumatic, no cyanosis or edema Neurologic: Alert and oriented X 3, normal strength and tone. Normal symmetric reflexes. Normal coordination and gait  ECOG PERFORMANCE STATUS: 2 - Symptomatic, <50% confined to bed  Blood pressure 121/62, pulse 85, temperature 98.6 F (37 C), temperature source Oral, resp. rate 18, height 5\' 6"  (1.676 m), weight 157 lb (71.215 kg), SpO2 96.00%.  LABORATORY DATA: Lab Results  Component Value Date   WBC 3.8* 10/11/2014   HGB 12.4 10/11/2014   HCT 38.6 10/11/2014   MCV 96.5 10/11/2014   PLT 179 10/11/2014      Chemistry      Component Value Date/Time   NA 138 10/11/2014 1319   NA 138 05/25/2014 0520   K 3.4* 10/11/2014 1319   K 4.0 05/25/2014 0520   CL 99 05/25/2014 0520   CO2 24 10/11/2014 1319   CO2 29 05/25/2014 0520   BUN 6.5* 10/11/2014 1319   BUN 7 05/25/2014 0520   CREATININE 0.9 10/11/2014 1319   CREATININE 0.64 05/25/2014 0520      Component Value Date/Time   CALCIUM 9.0 10/11/2014 1319   CALCIUM 8.0* 05/25/2014 0520   ALKPHOS 125 10/11/2014 1319   ALKPHOS 146* 04/20/2014 1108   AST 17 10/11/2014 1319   AST 15 04/20/2014 1108   ALT 11 10/11/2014 1319   ALT 7 04/20/2014 1108   BILITOT 0.40 10/11/2014 1319   BILITOT 0.2* 04/20/2014 1108       RADIOGRAPHIC STUDIES: Ct Chest W Contrast  09/15/2014   CLINICAL DATA:  Locally advanced non-small cell lung cancer. Squamous cell carcinoma diagnosed 2014. Stage IIIA.  EXAM: CT CHEST, ABDOMEN, AND PELVIS WITH CONTRAST  TECHNIQUE: Multidetector CT imaging of the chest, abdomen and pelvis was performed following the standard protocol during bolus administration of intravenous contrast.  CONTRAST:  153mL OMNIPAQUE IOHEXOL  300 MG/ML  SOLN  COMPARISON:  03/25/2014, CT  FINDINGS: CT CHEST FINDINGS  There is no axillary or supraclavicular lymphadenopathy. No mediastinal or hilar lymphadenopathy. Paratracheal lymph nodes are similar in size to prior. For example 8 mm short axis right lower paratracheal lymph node is unchanged from 10 mm on prior. 9 mm right lower paratracheal node compares to 10 mm on prior. No new  lymph nodes are identified. No pericardial fluid. Esophagus is normal.  Right infrahilar nodularity measures 17 mm (image 36, series 2) which is decreased from 39 mm on prior.  Within the right upper lobe there is a new nodule with surrounding ground-glass opacity. The nodularar portion measures 12 mm on image 24 of series 4. There is scattered nodularity peripheral to this larger nodule within the right upper lobe on image 19 and 21.  Interval improvement in right pleural effusion.  CT ABDOMEN AND PELVIS FINDINGS  Hepatobiliary: Within the superior aspect of the left lateral hepatic lobe there is a 11 mm hypodense lesion (image 51 of series 2) which is not present on comparison exams. Gallbladder is normal. The common bile duct is mildly dilated to 8 mm.  Spleen: normal  Pancreas: Pancreas is normal. No duct dilatation. No pancreatic inflammation.  Stomach/Bowel: Stomach, small bowel, and colon are normal.  Adrenals/urinary tract: Adrenal glands and kidneys are normal.  Vascular/Lymphatic: The infrarenal abdominal aorta is mildly dilated to is 26 mm. There is heavy interval calcification. No retroperitoneal periportal lymphadenopathy. No mesenteric peritoneal metastasis. No pelvic lymphadenopathy.  Reproductive: Post hysterectomy anatomy.  Next are normal.  Musculoskeletal: No aggressive osseous lesion. Degenerate changes of the hips. Postsurgical change in the lower thoracic spine and sacrum.  Other: Ascites  IMPRESSION: Chest Impression:  1. New right upper lobe pulmonary nodule is a concerning finding which could represent  lung cancer metastasis . A pulmonary infection would be in the differential. Recommend followup short-term CT (4 weeks) and if lesion persists consider tissue sampling. 2. Interval decrease in size of right infrahilar mass. 3. Stable borderline enlarged mediastinal lymph nodes.  Abdomen / Pelvis Impression:  1. Hypodense lesion in the left hepatic lobe is not seen on prior exams and is also concerning finding for metastasis. This could be further evaluated with contrast enhanced abdominal MRI or FDG PET scan. 2. No metastatic adenopathy in the abdomen or pelvis.   Electronically Signed   By: Suzy Bouchard M.D.   On: 09/15/2014 16:16   Ct Abdomen Pelvis W Contrast  09/15/2014   CLINICAL DATA:  Locally advanced non-small cell lung cancer. Squamous cell carcinoma diagnosed 2014. Stage IIIA.  EXAM: CT CHEST, ABDOMEN, AND PELVIS WITH CONTRAST  TECHNIQUE: Multidetector CT imaging of the chest, abdomen and pelvis was performed following the standard protocol during bolus administration of intravenous contrast.  CONTRAST:  129mL OMNIPAQUE IOHEXOL 300 MG/ML  SOLN  COMPARISON:  03/25/2014, CT  FINDINGS: CT CHEST FINDINGS  There is no axillary or supraclavicular lymphadenopathy. No mediastinal or hilar lymphadenopathy. Paratracheal lymph nodes are similar in size to prior. For example 8 mm short axis right lower paratracheal lymph node is unchanged from 10 mm on prior. 9 mm right lower paratracheal node compares to 10 mm on prior. No new lymph nodes are identified. No pericardial fluid. Esophagus is normal.  Right infrahilar nodularity measures 17 mm (image 36, series 2) which is decreased from 39 mm on prior.  Within the right upper lobe there is a new nodule with surrounding ground-glass opacity. The nodularar portion measures 12 mm on image 24 of series 4. There is scattered nodularity peripheral to this larger nodule within the right upper lobe on image 19 and 21.  Interval improvement in right pleural effusion.  CT  ABDOMEN AND PELVIS FINDINGS  Hepatobiliary: Within the superior aspect of the left lateral hepatic lobe there is a 11 mm hypodense lesion (image 51 of series 2) which is  not present on comparison exams. Gallbladder is normal. The common bile duct is mildly dilated to 8 mm.  Spleen: normal  Pancreas: Pancreas is normal. No duct dilatation. No pancreatic inflammation.  Stomach/Bowel: Stomach, small bowel, and colon are normal.  Adrenals/urinary tract: Adrenal glands and kidneys are normal.  Vascular/Lymphatic: The infrarenal abdominal aorta is mildly dilated to is 26 mm. There is heavy interval calcification. No retroperitoneal periportal lymphadenopathy. No mesenteric peritoneal metastasis. No pelvic lymphadenopathy.  Reproductive: Post hysterectomy anatomy.  Next are normal.  Musculoskeletal: No aggressive osseous lesion. Degenerate changes of the hips. Postsurgical change in the lower thoracic spine and sacrum.  Other: Ascites  IMPRESSION: Chest Impression:  1. New right upper lobe pulmonary nodule is a concerning finding which could represent lung cancer metastasis . A pulmonary infection would be in the differential. Recommend followup short-term CT (4 weeks) and if lesion persists consider tissue sampling. 2. Interval decrease in size of right infrahilar mass. 3. Stable borderline enlarged mediastinal lymph nodes.  Abdomen / Pelvis Impression:  1. Hypodense lesion in the left hepatic lobe is not seen on prior exams and is also concerning finding for metastasis. This could be further evaluated with contrast enhanced abdominal MRI or FDG PET scan. 2. No metastatic adenopathy in the abdomen or pelvis.   Electronically Signed   By: Suzy Bouchard M.D.   On: 09/15/2014 16:16   ASSESSMENT AND PLAN: This is a very pleasant 68 years old white female with stage IIIA non-small cell lung cancer completed a course of concurrent chemoradiation with weekly carboplatin and paclitaxel and tolerating her treatment fairly  well. She completed 2 more cycles of systemic chemotherapy with carboplatin and paclitaxel every 3 weeks but her treatment was discontinued after the patient had surgery for the left shoulder and has been observation for the last 3 months. The recent CT scan of the chest, abdomen and pelvis showed new right upper lobe pulmonary nodule concerning for metastases in addition to suspicious lesion in the left hepatic lobe but there was improvement in the right infrahilar mass as well as the mediastinal lymphadenopathy. She is currently receiving  treatment with immunotherapy with single agent Nivolumab. Status post 1 cycle. Patient was discussed with and also seen by Dr. Julien Nordmann. She will continue with cycle #2 of her immunotherapy today as scheduled. Regarding her pain medications, this was her last to speak again with her primary care physician to see if he will right bridge prescriptions until she is seen in the pain management clinic. She will followup in 2 weeks prior to cycle #3 of immunotherapy with Nivolumab.  She was advised to call immediately if she has any concerning symptoms in the interval. The patient voices understanding of current disease status and treatment options and is in agreement with the current care plan.  All questions were answered. The patient knows to call the clinic with any problems, questions or concerns. We can certainly see the patient much sooner if necessary.  Disclaimer: This note was dictated with voice recognition software. Similar sounding words can inadvertently be transcribed and may not be corrected upon review.    ADDENDUM: Hematology/Oncology Attending: I had a face to face encounter with the patient. I recommended her care plan. This is a very pleasant 68 years old white female with metastatic non-small cell lung cancer, squamous cell carcinoma currently undergoing immunotherapy with Nivolumab status post 1 cycle. The patient tolerated the first cycle of her  treatment fairly well with no significant adverse effects. She  continues to complain of the chronic back pain and her primary care physician referred her to a pain clinic but the patient has not seen them yet. She also mentioned that her primary care physician is refusing to give her any further pain medication until she sees the pain clinic. Mr. Is not very clear and we will try to contact her primary care physician to discuss the issue of chronic pain management. Hip pain issues has been there for years and preceded her diagnosis of the lung cancer.  I recommended for the patient to proceed with cycle #2 of her immunotherapy today as scheduled. She would come back for followup visit in 2 weeks for reevaluation and management any adverse effect of her treatment. She was advised to call immediately if she has any concerning symptoms in the interval.  Disclaimer: This note was dictated with voice recognition software. Similar sounding words can inadvertently be transcribed and may be missed upon review. Eilleen Kempf., MD 10/16/2014

## 2014-10-13 ENCOUNTER — Telehealth: Payer: Self-pay | Admitting: Internal Medicine

## 2014-10-13 NOTE — Telephone Encounter (Signed)
not able to reach pt or lm re next appt. schedule mailed. pt made aware prior to leaving 10/13 that whe would be contacted re appts. lb/fu 10/23/ and lb/tx remains 10/27. no availability for f/u 10/27. providers aware.

## 2014-10-14 NOTE — Patient Instructions (Signed)
[name] per [facility] office regarding bridge pain medicine prescriptions until you have your appointment with the pain management clinic. Be sure not to miss any appointments. Followup in 2 weeks prior to your next scheduled cycle of immunotherapy

## 2014-10-21 ENCOUNTER — Ambulatory Visit: Payer: Medicare Other | Admitting: Physician Assistant

## 2014-10-21 ENCOUNTER — Other Ambulatory Visit: Payer: Medicare Other

## 2014-10-25 ENCOUNTER — Ambulatory Visit (HOSPITAL_BASED_OUTPATIENT_CLINIC_OR_DEPARTMENT_OTHER): Payer: Medicare Other

## 2014-10-25 ENCOUNTER — Other Ambulatory Visit (HOSPITAL_BASED_OUTPATIENT_CLINIC_OR_DEPARTMENT_OTHER): Payer: Medicare Other

## 2014-10-25 ENCOUNTER — Other Ambulatory Visit: Payer: Self-pay | Admitting: Nurse Practitioner

## 2014-10-25 ENCOUNTER — Other Ambulatory Visit: Payer: Self-pay | Admitting: Hematology and Oncology

## 2014-10-25 VITALS — BP 126/88 | HR 82 | Temp 97.9°F

## 2014-10-25 DIAGNOSIS — R5383 Other fatigue: Secondary | ICD-10-CM

## 2014-10-25 DIAGNOSIS — C342 Malignant neoplasm of middle lobe, bronchus or lung: Secondary | ICD-10-CM

## 2014-10-25 DIAGNOSIS — C3491 Malignant neoplasm of unspecified part of right bronchus or lung: Secondary | ICD-10-CM

## 2014-10-25 DIAGNOSIS — C3431 Malignant neoplasm of lower lobe, right bronchus or lung: Secondary | ICD-10-CM

## 2014-10-25 DIAGNOSIS — Z5112 Encounter for antineoplastic immunotherapy: Secondary | ICD-10-CM

## 2014-10-25 DIAGNOSIS — R079 Chest pain, unspecified: Secondary | ICD-10-CM

## 2014-10-25 DIAGNOSIS — I10 Essential (primary) hypertension: Secondary | ICD-10-CM

## 2014-10-25 LAB — COMPREHENSIVE METABOLIC PANEL (CC13)
ALT: 14 U/L (ref 0–55)
ANION GAP: 8 meq/L (ref 3–11)
AST: 16 U/L (ref 5–34)
Albumin: 3.4 g/dL — ABNORMAL LOW (ref 3.5–5.0)
Alkaline Phosphatase: 145 U/L (ref 40–150)
BUN: 7.6 mg/dL (ref 7.0–26.0)
CALCIUM: 8.7 mg/dL (ref 8.4–10.4)
CHLORIDE: 106 meq/L (ref 98–109)
CO2: 22 meq/L (ref 22–29)
CREATININE: 0.8 mg/dL (ref 0.6–1.1)
GLUCOSE: 142 mg/dL — AB (ref 70–140)
Potassium: 4.2 mEq/L (ref 3.5–5.1)
Sodium: 136 mEq/L (ref 136–145)
Total Bilirubin: 0.24 mg/dL (ref 0.20–1.20)
Total Protein: 6.7 g/dL (ref 6.4–8.3)

## 2014-10-25 LAB — CBC WITH DIFFERENTIAL/PLATELET
BASO%: 1.5 % (ref 0.0–2.0)
Basophils Absolute: 0.1 10*3/uL (ref 0.0–0.1)
EOS ABS: 0.1 10*3/uL (ref 0.0–0.5)
EOS%: 2.7 % (ref 0.0–7.0)
HCT: 37.6 % (ref 34.8–46.6)
HGB: 12.2 g/dL (ref 11.6–15.9)
LYMPH%: 18.6 % (ref 14.0–49.7)
MCH: 31.2 pg (ref 25.1–34.0)
MCHC: 32.3 g/dL (ref 31.5–36.0)
MCV: 96.5 fL (ref 79.5–101.0)
MONO#: 0.2 10*3/uL (ref 0.1–0.9)
MONO%: 5.6 % (ref 0.0–14.0)
NEUT%: 71.6 % (ref 38.4–76.8)
NEUTROS ABS: 2.8 10*3/uL (ref 1.5–6.5)
PLATELETS: 170 10*3/uL (ref 145–400)
RBC: 3.9 10*6/uL (ref 3.70–5.45)
RDW: 15.3 % — ABNORMAL HIGH (ref 11.2–14.5)
WBC: 3.9 10*3/uL (ref 3.9–10.3)
lymph#: 0.7 10*3/uL — ABNORMAL LOW (ref 0.9–3.3)

## 2014-10-25 LAB — TSH CHCC: TSH: 4.865 m(IU)/L — ABNORMAL HIGH (ref 0.308–3.960)

## 2014-10-25 LAB — MAGNESIUM (CC13): Magnesium: 2.1 mg/dl (ref 1.5–2.5)

## 2014-10-25 MED ORDER — HYDROCODONE-ACETAMINOPHEN 5-325 MG PO TABS
ORAL_TABLET | ORAL | Status: AC
  Filled 2014-10-25: qty 1

## 2014-10-25 MED ORDER — OXYCODONE-ACETAMINOPHEN 5-325 MG PO TABS
1.0000 | ORAL_TABLET | Freq: Once | ORAL | Status: AC
Start: 2014-10-25 — End: 2014-10-25
  Administered 2014-10-25: 1 via ORAL

## 2014-10-25 MED ORDER — OXYCODONE HCL 5 MG PO TABS: 5.0000 mg | ORAL_TABLET | Freq: Four times a day (QID) | ORAL | Status: DC | PRN

## 2014-10-25 MED ORDER — SODIUM CHLORIDE 0.9 % IV SOLN
Freq: Once | INTRAVENOUS | Status: AC
  Administered 2014-10-25: 15:00:00 via INTRAVENOUS

## 2014-10-25 MED ORDER — SODIUM CHLORIDE 0.9 % IV SOLN
3.0000 mg/kg | Freq: Once | INTRAVENOUS | Status: AC
  Administered 2014-10-25: 220 mg via INTRAVENOUS
  Filled 2014-10-25: qty 22

## 2014-10-25 NOTE — Patient Instructions (Signed)
[facility] Discharge Instructions for Patients Receiving Chemotherapy  Today you received the following chemotherapy agents nivolumab   To help prevent nausea and vomiting after your treatment, we encourage you to take your nausea medication as directed   If you develop nausea and vomiting that is not controlled by your nausea medication, call the clinic.   BELOW ARE SYMPTOMS THAT SHOULD BE REPORTED IMMEDIATELY:  *FEVER GREATER THAN 100.5 F  *CHILLS WITH OR WITHOUT FEVER  NAUSEA AND VOMITING THAT IS NOT CONTROLLED WITH YOUR NAUSEA MEDICATION  *UNUSUAL SHORTNESS OF BREATH  *UNUSUAL BRUISING OR BLEEDING  TENDERNESS IN MOUTH AND THROAT WITH OR WITHOUT PRESENCE OF ULCERS  *URINARY PROBLEMS  *BOWEL PROBLEMS  UNUSUAL RASH Items with * indicate a potential emergency and should be followed up as soon as possible.  Feel free to call the clinic you have any questions or concerns. The clinic phone number is [phone].  

## 2014-10-31 ENCOUNTER — Encounter: Payer: Self-pay | Admitting: Physician Assistant

## 2014-11-02 ENCOUNTER — Other Ambulatory Visit: Payer: Self-pay | Admitting: *Deleted

## 2014-11-02 ENCOUNTER — Telehealth: Payer: Self-pay | Admitting: Internal Medicine

## 2014-11-02 NOTE — Telephone Encounter (Signed)
s.w. pt and advised on NOV 10 appt...pt ok and aware...she could not take later appt due to transportation problems.

## 2014-11-08 ENCOUNTER — Other Ambulatory Visit (HOSPITAL_BASED_OUTPATIENT_CLINIC_OR_DEPARTMENT_OTHER): Payer: Medicare Other

## 2014-11-08 ENCOUNTER — Ambulatory Visit (HOSPITAL_BASED_OUTPATIENT_CLINIC_OR_DEPARTMENT_OTHER): Payer: Medicare Other

## 2014-11-08 ENCOUNTER — Ambulatory Visit (HOSPITAL_BASED_OUTPATIENT_CLINIC_OR_DEPARTMENT_OTHER): Payer: Medicare Other | Admitting: Internal Medicine

## 2014-11-08 ENCOUNTER — Encounter: Payer: Self-pay | Admitting: Internal Medicine

## 2014-11-08 VITALS — BP 158/68 | HR 84 | Temp 98.6°F | Resp 17 | Ht 66.0 in | Wt 165.1 lb

## 2014-11-08 DIAGNOSIS — C3431 Malignant neoplasm of lower lobe, right bronchus or lung: Secondary | ICD-10-CM

## 2014-11-08 DIAGNOSIS — C3411 Malignant neoplasm of upper lobe, right bronchus or lung: Secondary | ICD-10-CM

## 2014-11-08 DIAGNOSIS — C342 Malignant neoplasm of middle lobe, bronchus or lung: Secondary | ICD-10-CM

## 2014-11-08 DIAGNOSIS — Z5112 Encounter for antineoplastic immunotherapy: Secondary | ICD-10-CM

## 2014-11-08 DIAGNOSIS — C3491 Malignant neoplasm of unspecified part of right bronchus or lung: Secondary | ICD-10-CM

## 2014-11-08 DIAGNOSIS — M549 Dorsalgia, unspecified: Secondary | ICD-10-CM

## 2014-11-08 DIAGNOSIS — R0602 Shortness of breath: Secondary | ICD-10-CM

## 2014-11-08 LAB — COMPREHENSIVE METABOLIC PANEL (CC13)
ALBUMIN: 3.4 g/dL — AB (ref 3.5–5.0)
ALK PHOS: 126 U/L (ref 40–150)
ALT: 11 U/L (ref 0–55)
AST: 14 U/L (ref 5–34)
Anion Gap: 7 mEq/L (ref 3–11)
BUN: 5.2 mg/dL — ABNORMAL LOW (ref 7.0–26.0)
CO2: 26 mEq/L (ref 22–29)
Calcium: 8.4 mg/dL (ref 8.4–10.4)
Chloride: 105 mEq/L (ref 98–109)
Creatinine: 0.8 mg/dL (ref 0.6–1.1)
Glucose: 181 mg/dl — ABNORMAL HIGH (ref 70–140)
POTASSIUM: 3.9 meq/L (ref 3.5–5.1)
Sodium: 138 mEq/L (ref 136–145)
Total Protein: 6.4 g/dL (ref 6.4–8.3)

## 2014-11-08 LAB — CBC WITH DIFFERENTIAL/PLATELET
BASO%: 1.2 % (ref 0.0–2.0)
Basophils Absolute: 0 10*3/uL (ref 0.0–0.1)
EOS%: 4.3 % (ref 0.0–7.0)
Eosinophils Absolute: 0.1 10*3/uL (ref 0.0–0.5)
HCT: 36.2 % (ref 34.8–46.6)
HEMOGLOBIN: 11.8 g/dL (ref 11.6–15.9)
LYMPH%: 21.7 % (ref 14.0–49.7)
MCH: 31.6 pg (ref 25.1–34.0)
MCHC: 32.5 g/dL (ref 31.5–36.0)
MCV: 97.4 fL (ref 79.5–101.0)
MONO#: 0.2 10*3/uL (ref 0.1–0.9)
MONO%: 8.1 % (ref 0.0–14.0)
NEUT#: 1.9 10*3/uL (ref 1.5–6.5)
NEUT%: 64.7 % (ref 38.4–76.8)
PLATELETS: 174 10*3/uL (ref 145–400)
RBC: 3.72 10*6/uL (ref 3.70–5.45)
RDW: 15.6 % — AB (ref 11.2–14.5)
WBC: 3 10*3/uL — ABNORMAL LOW (ref 3.9–10.3)
lymph#: 0.6 10*3/uL — ABNORMAL LOW (ref 0.9–3.3)

## 2014-11-08 MED ORDER — SODIUM CHLORIDE 0.9 % IV SOLN
3.0000 mg/kg | Freq: Once | INTRAVENOUS | Status: AC
  Administered 2014-11-08: 220 mg via INTRAVENOUS
  Filled 2014-11-08: qty 22

## 2014-11-08 MED ORDER — SODIUM CHLORIDE 0.9 % IV SOLN
Freq: Once | INTRAVENOUS | Status: AC
  Administered 2014-11-08: 13:00:00 via INTRAVENOUS

## 2014-11-08 NOTE — Patient Instructions (Signed)
Nivolumab injection What is this medicine? NIVOLUMAB (nye VOL ue mab) is used to treat certain types of melanoma and lung cancer. This medicine may be used for other purposes; ask your health care provider or pharmacist if you have questions. COMMON BRAND NAME(S): Opdivo What should I tell my health care provider before I take this medicine? They need to know if you have any of these conditions: -eye disease, vision problems -history of pancreatitis -immune system problems -inflammatory bowel disease -kidney disease -liver disease -lung disease -lupus -myasthenia gravis -multiple sclerosis -organ transplant -stomach or intestine problems -thyroid disease -tingling of the fingers or toes, or other nerve disorder -an unusual or allergic reaction to nivolumab, other medicines, foods, dyes, or preservatives -pregnant or trying to get pregnant -breast-feeding How should I use this medicine? This medicine is for infusion into a vein. It is given by a health care professional in a hospital or clinic setting. A special MedGuide will be given to you before each treatment. Be sure to read this information carefully each time. Talk to your pediatrician regarding the use of this medicine in children. Special care may be needed. Overdosage: If you think you've taken too much of this medicine contact a poison control center or emergency room at once. Overdosage: If you think you have taken too much of this medicine contact a poison control center or emergency room at once. NOTE: This medicine is only for you. Do not share this medicine with others. What if I miss a dose? It is important not to miss your dose. Call your doctor or health care professional if you are unable to keep an appointment. What may interact with this medicine? Interactions have not been studied. This list may not describe all possible interactions. Give your health care provider a list of all the medicines, herbs,  non-prescription drugs, or dietary supplements you use. Also tell them if you smoke, drink alcohol, or use illegal drugs. Some items may interact with your medicine. What should I watch for while using this medicine? Tell your doctor or healthcare professional if your symptoms do not start to get better or if they get worse. Your condition will be monitored carefully while you are receiving this medicine. You may need blood work done while you are taking this medicine. What side effects may I notice from receiving this medicine? Side effects that you should report to your doctor or health care professional as soon as possible: -allergic reactions like skin rash, itching or hives, swelling of the face, lips, or tongue -black, tarry stools -bloody or watery diarrhea -changes in vision -chills -cough -depressed mood -eye pain -feeling anxious -fever -general ill feeling or flu-like symptoms -hair loss -loss of appetite -low blood counts - this medicine may decrease the number of white blood cells, red blood cells and platelets. You may be at increased risk for infections and bleeding -pain, tingling, numbness in the hands or feet -redness, blistering, peeling or loosening of the skin, including inside the mouth -red pinpoint spots on skin -signs of decreased platelets or bleeding - bruising, pinpoint red spots on the skin, black, tarry stools, blood in the urine -signs of decreased red blood cells - unusually weak or tired, feeling faint or lightheaded, falls -signs of infection - fever or chills, cough, sore throat, pain or trouble passing urine -signs and symptoms of a dangerous change in heartbeat or heart rhythm like chest pain; dizziness; fast or irregular heartbeat; palpitations; feeling faint or lightheaded, falls; breathing problems -signs  and symptoms of high blood sugar such as dizziness; dry mouth; dry skin; fruity breath; nausea; stomach pain; increased hunger or thirst; increased  urination -signs and symptoms of kidney injury like trouble passing urine or change in the amount of urine -signs and symptoms of liver injury like dark yellow or brown urine; general ill feeling or flu-like symptoms; light-colored stools; loss of appetite; nausea; right upper belly pain; unusually weak or tired; yellowing of the eyes or skin -signs and symptoms of increased potassium like muscle weakness; chest pain; or fast, irregular heartbeat -signs and symptoms of low potassium like muscle cramps or muscle pain; chest pain; dizziness; feeling faint or lightheaded, falls; palpitations; breathing problems; or fast, irregular heartbeat -swelling of the ankles, feet, hands -weight gainSide effects that usually do not require medical attention (report to your doctor or health care professional if they continue or are bothersome): -constipation -general ill feeling or flu-like symptoms -hair loss -loss of appetite -nausea, vomiting This list may not describe all possible side effects. Call your doctor for medical advice about side effects. You may report side effects to FDA at 1-800-FDA-1088. Where should I keep my medicine? This drug is given in a hospital or clinic and will not be stored at home. NOTE: This sheet is a summary. It may not cover all possible information. If you have questions about this medicine, talk to your doctor, pharmacist, or health care provider.  2015, [employer]vier/Gold Standard. (2014-03-07 13:18:19)

## 2014-11-08 NOTE — Progress Notes (Signed)
[facility] [phone]   [phone]  OFFICE PROGRESS NOTE  Gabriela Cahill, MD  [place] [address]  DIAGNOSIS: Metastatic non-small cell lung cancer initially diagnosed as Locally advanced, likely stage IIIA (T2a., N2, M0) non-small cell lung cancer, squamous cell carcinoma diagnosed in September of 2014.   PRIOR THERAPY:  1) Concurrent chemoradiation with weekly carboplatin for AUC of 2 and paclitaxel 45 mg/M2, status post 7 cycles, last cycle was given 11/29/2013 with partial response. First dose was given on 10/18/2013.   2) Systemic chemotherapy with carboplatin for AUC of 5 and paclitaxel 175 mg/M2 every 3 weeks with Neulasta support. First cycle on 04/06/2014. Status post 2 cycles.  CURRENT THERAPY: Immunotherapy with Nivolumab 3 mg/KG every 2 weeks. First cycle 09/27/2014. Status post 3 cycles.  CHEMOTHERAPY INTENT: Curative/control  CURRENT # OF CHEMOTHERAPY CYCLES: 4 CURRENT ANTIEMETICS: Zofran, Compazine and dexamethasone  CURRENT SMOKING STATUS: Current smoker and she was strongly advised to quit smoking and also to smoke cessation program  ORAL CHEMOTHERAPY AND CONSENT: None  CURRENT BISPHOSPHONATES USE: None  PAIN MANAGEMENT: 0/10  NARCOTICS INDUCED CONSTIPATION: None  LIVING WILL AND CODE STATUS: Full code  INTERVAL HISTORY: [name] 68 y.o. female returns to the clinic today for follow up visit accompanied by her Neice. The patient is feeling fine and tolerating her immunotherapy with Nivolumab fairly well. She continues to complain of the chronic back pain and she is currently on treatment with Vicodin by her primary care physician. She denied having any significant chest pain but continues to have shortness of breath with exertion with no hemoptysis. She denied having any significant weight loss or night sweats.she is here today to start cycle #4 of her treatment.  MEDICAL HISTORY: Past Medical History    Diagnosis Date  . H/O: stroke 2012, 2000,     X3  . Hypertension   . Anxiety   . PTSD (post-traumatic stress disorder)   . Depression   . SVD (spontaneous vaginal delivery)     x 1  . Smoker   . Osteoarthritis     knees, hips hands,   . Shortness of breath   . Pain     SEVERE PAIN BACK, RIGHT HIP AND KNEES--HARRINGTON RODS AND CERVICAL PLATES--USES WALKER OR CANE WHEN AMBULATING - GOES TO A PAIN CLINIC-STATES SHE NEEDS RT HIP AND BOTH KNEES REPLACED. PT STATES SHE WAS TOLD THE CEMENT AROUND THE HARRINGTON RODS IS CRACKED.  [name] Hot flashes, menopausal     SEVERE  . Complication of anesthesia 03/02/13    severe headache,vertigo postop  . COPD (chronic obstructive pulmonary disease)     stable  . 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 Lung cancer   . Status post radiation therapy within four to twelve weeks 10/18/13-12/06/13    lung ca  66Gy  . Family history of anesthesia complication     equipment failure caused problem, suffered a stroke & heartattack    ALLERGIES:  is allergic to carafate; prednisone; nsaids; aspirin; ibuprofen; and dulera.  MEDICATIONS:  Current Outpatient Prescriptions  Medication Sig Dispense Refill  . Aclidinium Bromide (TUDORZA PRESSAIR) 400 MCG/ACT AEPB Inhale 1 puff into the lungs 2 (two) times daily.    [name] albuterol (PROVENTIL) (2.5 MG/3ML) 0.083% nebulizer solution Take 2.5 mg by nebulization as needed for wheezing.    [name] albuterol (VENTOLIN HFA) 108 (90 BASE) MCG/ACT inhaler Inhale 2 puffs into the lungs  every 6 (six) hours as needed for wheezing or shortness of breath. 1 Inhaler 11  . ALPRAZolam (XANAX) 1 MG tablet Take 1 tablet (1 mg total) by mouth every 6 (six) hours as needed for anxiety. 30 tablet 0  . atenolol (TENORMIN) 50 MG tablet Take 1 tablet (50 mg total) by mouth daily. 30 tablet 0  . chlorzoxazone (PARAFON) 500 MG tablet Take 500 mg by mouth 4 (four) times daily as needed for muscle spasms.    [name] dextromethorphan-guaiFENesin (MUCINEX DM)  30-600 MG per 12 hr tablet Take 1 tablet by mouth every 12 (twelve) hours.    [name] esomeprazole (NEXIUM) 40 MG capsule Take 40 mg by mouth daily as needed.     . ondansetron (ZOFRAN ODT) 4 MG disintegrating tablet Take 1 tablet (4 mg total) by mouth every 8 (eight) hours as needed for nausea or vomiting. 20 tablet 0  . oxyCODONE (OXY IR/ROXICODONE) 5 MG immediate release tablet Take 1 tablet (5 mg total) by mouth every 6 (six) hours as needed for severe pain. 30 tablet 0  . promethazine (PHENERGAN) 25 MG tablet Take 25 mg by mouth every 6 (six) hours as needed for nausea or vomiting.    . sertraline (ZOLOFT) 100 MG tablet Take 200 mg by mouth daily.     . trazodone (DESYREL) 300 MG tablet Take 300 mg by mouth at bedtime.     No current facility-administered medications for this visit.    SURGICAL HISTORY:  Past Surgical History  Procedure Laterality Date  . Carpal tunnel release      BILATERAL  . Nasal sinus surgery    . Lumbar fusion      L4-S1  . Cervical fusion    . Hernia repair    . Salpingoophorectomy Right   . Vaginal hysterectomy      TAH w/ ovary removal  . Transthoracic echocardiogram  12-03-2011    LVSF NORMAL/ EF 60-65%  . Vulvectomy N/A 03/02/2013    Procedure: WIDE LOCAL EXCISION VULVAR;  Surgeon: Imagene Gurney A. Alycia Rossetti, MD;  Location: [facility];  Service: Gynecology;  Laterality: N/A;  . Co2 laser application N/A 1/91/4782    Procedure: LASER APPLICATION OF THE VULVA;  Surgeon: Janie Morning, MD;  Location: [facility];  Service: Gynecology;  Laterality: N/A;  . Video bronchoscopy Bilateral 09/23/2013    Procedure: VIDEO BRONCHOSCOPY WITHOUT FLUORO;  Surgeon: Tanda Rockers, MD;  Location: [facility];  Service: Cardiopulmonary;  Laterality: Bilateral;  . Hemiarthroplasty shoulder fracture Left 05/25/2014    DR CHANDLER  . Reverse shoulder arthroplasty Left 05/24/2014    Procedure:   Hemi-Arthroplasty  Left Shoulder;  Surgeon: Nita Sells, MD;  Location:  [facility];  Service: Orthopedics;  Laterality: Left;    REVIEW OF SYSTEMS:  Constitutional: positive for fatigue Eyes: negative Ears, nose, mouth, throat, and face: negative Respiratory: positive for cough, dyspnea on exertion and sputum Cardiovascular: negative Gastrointestinal: negative Genitourinary:negative Integument/breast: negative Hematologic/lymphatic: negative Musculoskeletal:positive for muscle weakness Neurological: negative Behavioral/Psych: negative Endocrine: negative Allergic/Immunologic: negative   PHYSICAL EXAMINATION: General appearance: alert, cooperative, fatigued and no distress Head: Normocephalic, without obvious abnormality, atraumatic Neck: no adenopathy, no JVD, supple, symmetrical, trachea midline and thyroid not enlarged, symmetric, no tenderness/mass/nodules Lymph nodes: Cervical, supraclavicular, and axillary nodes normal. Resp: wheezes bilaterally Back: symmetric, no curvature. ROM normal. No CVA tenderness. Cardio: regular rate and rhythm, S1, S2 normal, no murmur, click, rub or gallop GI: soft, non-tender; bowel sounds normal; no masses,  no organomegaly  Extremities: extremities normal, atraumatic, no cyanosis or edema Neurologic: Alert and oriented X 3, normal strength and tone. Normal symmetric reflexes. Normal coordination and gait  ECOG PERFORMANCE STATUS: 2 - Symptomatic, <50% confined to bed  Blood pressure 158/68, pulse 84, temperature 98.6 F (37 C), temperature source Oral, resp. rate 17, height 5\' 6"  (1.676 m), weight 165 lb 1.6 oz (74.889 kg), SpO2 97 %.  LABORATORY DATA: Lab Results  Component Value Date   WBC 3.0* 11/08/2014   HGB 11.8 11/08/2014   HCT 36.2 11/08/2014   MCV 97.4 11/08/2014   PLT 174 11/08/2014      Chemistry      Component Value Date/Time   NA 136 10/25/2014 1350   NA 138 05/25/2014 0520   K 4.2 10/25/2014 1350   K 4.0 05/25/2014 0520   CL 99 05/25/2014 0520   CO2 22 10/25/2014 1350   CO2 29 05/25/2014  0520   BUN 7.6 10/25/2014 1350   BUN 7 05/25/2014 0520   CREATININE 0.8 10/25/2014 1350   CREATININE 0.64 05/25/2014 0520      Component Value Date/Time   CALCIUM 8.7 10/25/2014 1350   CALCIUM 8.0* 05/25/2014 0520   ALKPHOS 145 10/25/2014 1350   ALKPHOS 146* 04/20/2014 1108   AST 16 10/25/2014 1350   AST 15 04/20/2014 1108   ALT 14 10/25/2014 1350   ALT 7 04/20/2014 1108   BILITOT 0.24 10/25/2014 1350   BILITOT 0.2* 04/20/2014 1108       RADIOGRAPHIC STUDIES:  ASSESSMENT AND PLAN: This is a very pleasant 69 years old white female with stage IIIA non-small cell lung cancer completed a course of concurrent chemoradiation with weekly carboplatin and paclitaxel and tolerating her treatment fairly well. She completed 2 more cycles of systemic chemotherapy with carboplatin and paclitaxel every 3 weeks but her treatment was discontinued after the patient had surgery for the left shoulder She had evidence for disease progression and the patient was started on treatment with immunotherapy with Nivolumab 3 MG/KG and tolerating her treatment fairly well. She is status post 3 cycles. We will proceed with cycle #4 today as scheduled. The patient would come back for follow-up visit in 2 weeks after repeating CT scan of the chest for restaging of her disease.  She was advised to call immediately if she has any concerning symptoms in the interval. The patient voices understanding of current disease status and treatment options and is in agreement with the current care plan.  All questions were answered. The patient knows to call the clinic with any problems, questions or concerns. We can certainly see the patient much sooner if necessary.  Disclaimer: This note was dictated with voice recognition software. Similar sounding words can inadvertently be transcribed and may not be corrected upon review.

## 2014-11-08 NOTE — Patient Instructions (Signed)
Smoking Cessation, Tips for Success  If you are ready to quit smoking, congratulations! You have chosen to help yourself be healthier. Cigarettes bring nicotine, tar, carbon monoxide, and other irritants into your body. Your lungs, heart, and blood vessels will be able to work better without these poisons. There are many different ways to quit smoking. Nicotine gum, nicotine patches, a nicotine inhaler, or nicotine nasal spray can help with physical craving. Hypnosis, support groups, and medicines help break the habit of smoking.  WHAT THINGS CAN I DO TO MAKE QUITTING EASIER?   Here are some tips to help you quit for good:  · Pick a date when you will quit smoking completely. Tell all of your friends and family about your plan to quit on that date.  · Do not try to slowly cut down on the number of cigarettes you are smoking. Pick a quit date and quit smoking completely starting on that day.  · Throw away all cigarettes.    · Clean and remove all ashtrays from your home, work, and car.  · On a card, write down your reasons for quitting. Carry the card with you and read it when you get the urge to smoke.  · Cleanse your body of nicotine. Drink enough water and fluids to keep your urine clear or pale yellow. Do this after quitting to flush the nicotine from your body.  · Learn to predict your moods. Do not let a bad situation be your excuse to have a cigarette. Some situations in your life might tempt you into wanting a cigarette.  · Never have "just one" cigarette. It leads to wanting another and another. Remind yourself of your decision to quit.  · Change habits associated with smoking. If you smoked while driving or when feeling stressed, try other activities to replace smoking. Stand up when drinking your coffee. Brush your teeth after eating. Sit in a different chair when you read the paper. Avoid alcohol while trying to quit, and try to drink fewer caffeinated beverages. Alcohol and caffeine may urge you to  smoke.  · Avoid foods and drinks that can trigger a desire to smoke, such as sugary or spicy foods and alcohol.  · Ask people who smoke not to smoke around you.  · Have something planned to do right after eating or having a cup of coffee. For example, plan to take a walk or exercise.  · Try a relaxation exercise to calm you down and decrease your stress. Remember, you may be tense and nervous for the first 2 weeks after you quit, but this will pass.  · Find new activities to keep your hands busy. Play with a pen, coin, or rubber band. Doodle or draw things on paper.  · Brush your teeth right after eating. This will help cut down on the craving for the taste of tobacco after meals. You can also try mouthwash.    · Use oral substitutes in place of cigarettes. Try using lemon drops, carrots, cinnamon sticks, or chewing gum. Keep them handy so they are available when you have the urge to smoke.  · When you have the urge to smoke, try deep breathing.  · Designate your home as a nonsmoking area.  · If you are a heavy smoker, ask your health care provider about a prescription for nicotine chewing gum. It can ease your withdrawal from nicotine.  · Reward yourself. Set aside the cigarette money you save and buy yourself something nice.  · Look for   support from others. Join a support group or smoking cessation program. Ask someone at home or at work to help you with your plan to quit smoking.  · Always ask yourself, "Do I need this cigarette or is this just a reflex?" Tell yourself, "Today, I choose not to smoke," or "I do not want to smoke." You are reminding yourself of your decision to quit.  · Do not replace cigarette smoking with electronic cigarettes (commonly called e-cigarettes). The safety of e-cigarettes is unknown, and some may contain harmful chemicals.  · If you relapse, do not give up! Plan ahead and think about what you will do the next time you get the urge to smoke.  HOW WILL I FEEL WHEN I QUIT SMOKING?  You  may have symptoms of withdrawal because your body is used to nicotine (the addictive substance in cigarettes). You may crave cigarettes, be irritable, feel very hungry, cough often, get headaches, or have difficulty concentrating. The withdrawal symptoms are only temporary. They are strongest when you first quit but will go away within 10-14 days. When withdrawal symptoms occur, stay in control. Think about your reasons for quitting. Remind yourself that these are signs that your body is healing and getting used to being without cigarettes. Remember that withdrawal symptoms are easier to treat than the major diseases that smoking can cause.   Even after the withdrawal is over, expect periodic urges to smoke. However, these cravings are generally short lived and will go away whether you smoke or not. Do not smoke!  WHAT RESOURCES ARE AVAILABLE TO HELP ME QUIT SMOKING?  Your health care provider can direct you to community resources or hospitals for support, which may include:  · Group support.  · Education.  · Hypnosis.  · Therapy.  Document Released: 09/13/2004 Document Revised: 05/02/2014 Document Reviewed: 06/03/2013  [employer]® Patient Information ©2015 [employer]. This information is not intended to replace advice given to you by your health care provider. Make sure you discuss any questions you have with your health care provider.

## 2014-11-09 ENCOUNTER — Telehealth: Payer: Self-pay | Admitting: Internal Medicine

## 2014-11-09 NOTE — Telephone Encounter (Signed)
pt called back and I gv her all NOV appts...pt ok and aware

## 2014-11-09 NOTE — Telephone Encounter (Signed)
no vm....mailed pt appt sched/avs and letter

## 2014-11-10 ENCOUNTER — Telehealth: Payer: Self-pay | Admitting: *Deleted

## 2014-11-10 DIAGNOSIS — C3411 Malignant neoplasm of upper lobe, right bronchus or lung: Secondary | ICD-10-CM

## 2014-11-10 MED ORDER — ACLIDINIUM BROMIDE 400 MCG/ACT IN AEPB: 1.0000 | INHALATION_SPRAY | Freq: Two times a day (BID) | RESPIRATORY_TRACT | Status: DC

## 2014-11-10 MED ORDER — ALBUTEROL SULFATE HFA 108 (90 BASE) MCG/ACT IN AERS: 2.0000 | INHALATION_SPRAY | Freq: Four times a day (QID) | RESPIRATORY_TRACT | Status: DC | PRN
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
## 2014-11-15 ENCOUNTER — Other Ambulatory Visit: Payer: Self-pay | Admitting: Medical Oncology

## 2014-11-15 ENCOUNTER — Emergency Department (HOSPITAL_COMMUNITY): Payer: Medicare Other

## 2014-11-15 ENCOUNTER — Encounter (HOSPITAL_COMMUNITY): Payer: Self-pay | Admitting: Emergency Medicine

## 2014-11-15 ENCOUNTER — Emergency Department (HOSPITAL_COMMUNITY)
Admission: EM | Admit: 2014-11-15 | Discharge: 2014-11-15 | Disposition: A | Discharge status: Home / Self Care | Payer: Medicare Other | Attending: Emergency Medicine | Admitting: Emergency Medicine

## 2014-11-15 DIAGNOSIS — F419 Anxiety disorder, unspecified: Secondary | ICD-10-CM | POA: Insufficient documentation

## 2014-11-15 DIAGNOSIS — F329 Major depressive disorder, single episode, unspecified: Secondary | ICD-10-CM | POA: Diagnosis not present

## 2014-11-15 DIAGNOSIS — Z923 Personal history of irradiation: Secondary | ICD-10-CM | POA: Diagnosis not present

## 2014-11-15 DIAGNOSIS — I10 Essential (primary) hypertension: Secondary | ICD-10-CM | POA: Diagnosis not present

## 2014-11-15 DIAGNOSIS — S93602A Unspecified sprain of left foot, initial encounter: Secondary | ICD-10-CM | POA: Insufficient documentation

## 2014-11-15 DIAGNOSIS — Y9389 Activity, other specified: Secondary | ICD-10-CM | POA: Diagnosis not present

## 2014-11-15 DIAGNOSIS — Y92009 Unspecified place in unspecified non-institutional (private) residence as the place of occurrence of the external cause: Secondary | ICD-10-CM | POA: Diagnosis not present

## 2014-11-15 DIAGNOSIS — W1839XA Other fall on same level, initial encounter: Secondary | ICD-10-CM | POA: Diagnosis not present

## 2014-11-15 DIAGNOSIS — M1990 Unspecified osteoarthritis, unspecified site: Secondary | ICD-10-CM | POA: Diagnosis not present

## 2014-11-15 DIAGNOSIS — Z720 Tobacco use: Secondary | ICD-10-CM | POA: Diagnosis not present

## 2014-11-15 DIAGNOSIS — Z79899 Other long term (current) drug therapy: Secondary | ICD-10-CM | POA: Insufficient documentation

## 2014-11-15 DIAGNOSIS — C3490 Malignant neoplasm of unspecified part of unspecified bronchus or lung: Secondary | ICD-10-CM

## 2014-11-15 DIAGNOSIS — Z85118 Personal history of other malignant neoplasm of bronchus and lung: Secondary | ICD-10-CM | POA: Diagnosis not present

## 2014-11-15 DIAGNOSIS — M79673 Pain in unspecified foot: Secondary | ICD-10-CM

## 2014-11-15 DIAGNOSIS — S73101A Unspecified sprain of right hip, initial encounter: Secondary | ICD-10-CM | POA: Insufficient documentation

## 2014-11-15 DIAGNOSIS — Z8673 Personal history of transient ischemic attack (TIA), and cerebral infarction without residual deficits: Secondary | ICD-10-CM | POA: Insufficient documentation

## 2014-11-15 DIAGNOSIS — Y998 Other external cause status: Secondary | ICD-10-CM | POA: Diagnosis not present

## 2014-11-15 DIAGNOSIS — R52 Pain, unspecified: Secondary | ICD-10-CM

## 2014-11-15 DIAGNOSIS — S79911A Unspecified injury of right hip, initial encounter: Secondary | ICD-10-CM | POA: Diagnosis present

## 2014-11-15 DIAGNOSIS — J449 Chronic obstructive pulmonary disease, unspecified: Secondary | ICD-10-CM | POA: Diagnosis not present

## 2014-11-15 LAB — BASIC METABOLIC PANEL
ANION GAP: 9 (ref 5–15)
BUN: 6 mg/dL (ref 6–23)
CALCIUM: 8.3 mg/dL — AB (ref 8.4–10.5)
CHLORIDE: 99 meq/L (ref 96–112)
CO2: 28 mEq/L (ref 19–32)
Creatinine, Ser: 0.74 mg/dL (ref 0.50–1.10)
GFR calc Af Amer: >90 mL/min (ref >90)
GFR calc non Af Amer: 86 mL/min — ABNORMAL LOW (ref >90)
GLUCOSE: 104 mg/dL — AB (ref 70–99)
Potassium: 4.6 mEq/L (ref 3.7–5.3)
SODIUM: 136 meq/L — AB (ref 137–147)

## 2014-11-15 LAB — CBC WITH DIFFERENTIAL/PLATELET
Basophils Absolute: 0 10*3/uL (ref 0.0–0.1)
Basophils Relative: 1 % (ref 0–1)
EOS PCT: 3 % (ref 0–5)
Eosinophils Absolute: 0.1 10*3/uL (ref 0.0–0.7)
HEMATOCRIT: 35.8 % — AB (ref 36.0–46.0)
HEMOGLOBIN: 11.9 g/dL — AB (ref 12.0–15.0)
LYMPHS ABS: 0.8 10*3/uL (ref 0.7–4.0)
LYMPHS PCT: 20 % (ref 12–46)
MCH: 32.2 pg (ref 26.0–34.0)
MCHC: 33.2 g/dL (ref 30.0–36.0)
MCV: 96.8 fL (ref 78.0–100.0)
MONO ABS: 0.3 10*3/uL (ref 0.1–1.0)
MONOS PCT: 7 % (ref 3–12)
NEUTROS ABS: 2.8 10*3/uL (ref 1.7–7.7)
Neutrophils Relative %: 69 % (ref 43–77)
Platelets: 145 10*3/uL — ABNORMAL LOW (ref 150–400)
RBC: 3.7 MIL/uL — ABNORMAL LOW (ref 3.87–5.11)
RDW: 15.3 % (ref 11.5–15.5)
WBC: 4.1 10*3/uL (ref 4.0–10.5)

## 2014-11-15 MED ORDER — BENZONATATE 200 MG PO CAPS: 200.0000 mg | ORAL_CAPSULE | Freq: Three times a day (TID) | ORAL | Status: DC | PRN

## 2014-11-15 MED ORDER — OXYCODONE-ACETAMINOPHEN 7.5-325 MG PO TABS: 1.0000 | ORAL_TABLET | Freq: Four times a day (QID) | ORAL | Status: DC | PRN

## 2014-11-15 MED ORDER — ONDANSETRON HCL 4 MG/2ML IJ SOLN
4.0000 mg | Freq: Once | INTRAMUSCULAR | Status: AC
  Administered 2014-11-15: 4 mg via INTRAVENOUS
  Filled 2014-11-15: qty 2

## 2014-11-15 MED ORDER — HYDROMORPHONE HCL 1 MG/ML IJ SOLN
1.0000 mg | Freq: Once | INTRAMUSCULAR | Status: AC
  Administered 2014-11-15: 1 mg via INTRAVENOUS
  Filled 2014-11-15: qty 1

## 2014-11-15 NOTE — ED Provider Notes (Signed)
CSN: [id number]     Arrival date & time 11/15/14  1036 History  This chart was scribed for Sharyon Cable, MD by [name], ED Scribe. This patient was seen in room APA18/APA18 and the patient's care was started at 12:18 PM.    Chief Complaint  Patient presents with  . Hip Pain   Patient is a 68 y.o. female presenting with hip pain and lower extremity pain. The history is provided by the patient. No language interpreter was used.  Hip Pain This is a new problem. The current episode started 2 days ago. The problem occurs constantly. The problem has not changed since onset.Pertinent negatives include no chest pain and no abdominal pain. Nothing aggravates the symptoms. Nothing relieves the symptoms. She has tried nothing for the symptoms.  Foot Pain This is a new problem. The current episode started 2 days ago. The problem occurs constantly. The problem has not changed since onset.Pertinent negatives include no chest pain and no abdominal pain. Nothing aggravates the symptoms. Nothing relieves the symptoms. She has tried nothing for the symptoms.    HPI Comments: [name] is a 68 y.o. female who presents to the Emergency Department complaining of right hip and left foot pain status post falling after tripping on a bump in her carpet 2 days ago. She denies striking her head or LOC. She notes chronic neck pain, back pain, knee pain. and chest pain but denies new pain. She denies abdominal pain. She states she had an appointment at her pain clinic today, but was not able to make it due to her injuries. She states she has lung cancer and is receiving chemotherapy at [facility].  Past Medical History  Diagnosis Date  . H/O: stroke 2012, 2000,     X3  . Hypertension   . Anxiety   . PTSD (post-traumatic stress disorder)   . Depression   . SVD (spontaneous vaginal delivery)     x 1  . Smoker   . Osteoarthritis     knees, hips hands,   . Shortness of breath   . Pain     SEVERE PAIN BACK,  RIGHT HIP AND KNEES--HARRINGTON RODS AND CERVICAL PLATES--USES WALKER OR CANE WHEN AMBULATING - GOES TO A PAIN CLINIC-STATES SHE NEEDS RT HIP AND BOTH KNEES REPLACED. PT STATES SHE WAS TOLD THE CEMENT AROUND THE HARRINGTON RODS IS CRACKED.  [name] Hot flashes, menopausal     SEVERE  . Complication of anesthesia 03/02/13    severe headache,vertigo postop  . COPD (chronic obstructive pulmonary disease)     stable  . 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 Lung cancer   . Status post radiation therapy within four to twelve weeks 10/18/13-12/06/13    lung ca  66Gy  . Family history of anesthesia complication     equipment failure caused problem, suffered a stroke & heartattack   Past Surgical History  Procedure Laterality Date  . Carpal tunnel release      BILATERAL  . Nasal sinus surgery    . Lumbar fusion      L4-S1  . Cervical fusion    . Hernia repair    . Salpingoophorectomy Right   . Vaginal hysterectomy      TAH w/ ovary removal  . Transthoracic echocardiogram  12-03-2011    LVSF NORMAL/ EF 60-65%  . Vulvectomy N/A 03/02/2013    Procedure: WIDE LOCAL EXCISION VULVAR;  Surgeon: Imagene Gurney A. Alycia Rossetti, MD;  Location: [facility];  Service: Gynecology;  Laterality: N/A;  . Co2 laser application N/A 1/61/0960    Procedure: LASER APPLICATION OF THE VULVA;  Surgeon: Janie Morning, MD;  Location: [facility];  Service: Gynecology;  Laterality: N/A;  . Video bronchoscopy Bilateral 09/23/2013    Procedure: VIDEO BRONCHOSCOPY WITHOUT FLUORO;  Surgeon: Tanda Rockers, MD;  Location: [facility];  Service: Cardiopulmonary;  Laterality: Bilateral;  . Hemiarthroplasty shoulder fracture Left 05/25/2014    DR CHANDLER  . Reverse shoulder arthroplasty Left 05/24/2014    Procedure:   Hemi-Arthroplasty  Left Shoulder;  Surgeon: Nita Sells, MD;  Location: [facility];  Service: Orthopedics;  Laterality: Left;   Family History  Problem Relation Age of Onset  . Cancer Mother     COLON  . Heart  disease Father   . Diabetes Maternal Aunt   . Cancer Maternal Grandfather     KIDNEY   . Diabetes Maternal Grandfather   . Hypertension Maternal Grandfather   . Heart disease Maternal Grandfather    History  Substance Use Topics  . Smoking status: Light Tobacco Smoker -- 0.50 packs/day for 35 years    Types: Cigarettes  . Smokeless tobacco: Never Used     Comment: 4cigs per day 02/17/13  . Alcohol Use: No   OB History    Gravida Para Term Preterm AB TAB SAB Ectopic Multiple Living   2 1   1     1      Review of Systems  Cardiovascular: Negative for chest pain.  Gastrointestinal: Negative for abdominal pain.  Musculoskeletal: Negative for back pain and neck pain.       Left foot pain, right hip pain  All other systems reviewed and are negative.     Allergies  Carafate; Prednisone; Nsaids; Aspirin; Ibuprofen; and Dulera  Home Medications   Prior to Admission medications   Medication Sig Start Date End Date Taking? Authorizing Provider  Aclidinium Bromide (TUDORZA PRESSAIR) 400 MCG/ACT AEPB Inhale 1 puff into the lungs 2 (two) times daily. 11/10/14  Yes Curt Bears, MD  ALPRAZolam Duanne Moron) 1 MG tablet Take 1 tablet (1 mg total) by mouth every 6 (six) hours as needed for anxiety. 01/01/14  Yes Kinnie Feil, MD  atenolol (TENORMIN) 50 MG tablet Take 1 tablet (50 mg total) by mouth daily. 11/25/13  Yes Velvet Bathe, MD  chlorzoxazone (PARAFON) 500 MG tablet Take 500 mg by mouth 4 (four) times daily as needed for muscle spasms.   Yes Historical Provider, MD  dextromethorphan-guaiFENesin (MUCINEX DM) 30-600 MG per 12 hr tablet Take 1 tablet by mouth every 12 (twelve) hours.   Yes Historical Provider, MD  esomeprazole (NEXIUM) 40 MG capsule Take 40 mg by mouth daily as needed.    Yes Historical Provider, MD  HYDROcodone-acetaminophen (NORCO/VICODIN) 5-325 MG per tablet Take 0.5 tablets by mouth every 6 (six) hours as needed (pain).    Yes Historical Provider, MD  promethazine  (PHENERGAN) 25 MG tablet Take 25 mg by mouth every 6 (six) hours as needed for nausea or vomiting.   Yes Historical Provider, MD  sertraline (ZOLOFT) 100 MG tablet Take 200 mg by mouth daily.    Yes Historical Provider, MD  trazodone (DESYREL) 300 MG tablet Take 300 mg by mouth at bedtime.   Yes Historical Provider, MD  albuterol (PROVENTIL) (2.5 MG/3ML) 0.083% nebulizer solution Take 2.5 mg by nebulization as needed for wheezing.    Historical Provider, MD  albuterol (VENTOLIN HFA) 108 (90 BASE) MCG/ACT inhaler  Inhale 2 puffs into the lungs every 6 (six) hours as needed for wheezing or shortness of breath. 11/10/14   Curt Bears, MD  ondansetron (ZOFRAN ODT) 4 MG disintegrating tablet Take 1 tablet (4 mg total) by mouth every 8 (eight) hours as needed for nausea or vomiting. 04/14/14   Curt Bears, MD   BP 137/72 mmHg  Pulse 80  Temp(Src) 98 F (36.7 C) (Oral)  Resp 18  Ht 5\' 6"  (1.676 m)  Wt 160 lb (72.576 kg)  BMI 25.84 kg/m2  SpO2 96% Physical Exam  Nursing note and vitals reviewed.  CONSTITUTIONAL: elderly and frail HEAD: Normocephalic/atraumatic EYES: EOMI/PERRL ENMT: Mucous membranes moist NECK: supple no meningeal signs SPINE/BACK:entire spine nontender CV: S1/S2 noted, no murmurs/rubs/gallops noted LUNGS: coarse breath sounds noted bilaterally, no distress noted ABDOMEN: soft, nontender, no rebound or guarding, bowel sounds noted throughout abdomen GU:no cva tenderness NEURO: Pt is awake/alert/appropriate, moves all extremitiesx4.  No facial droop.   EXTREMITIES: pulses normal/equal, full ROM, tenderness to ROM of right hip and palpation of right hip, tenderness to palpation in left foot, All other extremities/joints palpated/ranged and nontender SKIN: warm, color normal PSYCH: no abnormalities of mood noted, alert and oriented to situation  ED Course  Procedures  DIAGNOSTIC STUDIES: Oxygen Saturation is 96% on room air, normal by my interpretation.     COORDINATION OF CARE: 12:25 PM Discussed treatment plan with patient at beside, the patient agrees with the plan and has no further questions at this time.   Pt improved Imaging negative She improved with IV narcotics No acute fracture She can ambulate She has no other new complaints Patient sustained mechanical fall 2 days ago She feels comfortable for d/c home She requests percocet 7.5mg /325 for pain control and reports only have one tablet left at home and does not have f/u soon with specialist Short course of pain meds ordered  Medications  HYDROmorphone (DILAUDID) injection 1 mg (1 mg Intravenous Given 11/15/14 1246)  ondansetron (ZOFRAN) injection 4 mg (4 mg Intravenous Given 11/15/14 1246)     Labs Review Labs Reviewed  BASIC METABOLIC PANEL - Abnormal; Notable for the following:    Sodium 136 (*)    Glucose, Bld 104 (*)    Calcium 8.3 (*)    GFR calc non Af Amer 86 (*)    All other components within normal limits  CBC WITH DIFFERENTIAL - Abnormal; Notable for the following:    RBC 3.70 (*)    Hemoglobin 11.9 (*)    HCT 35.8 (*)    Platelets 145 (*)    All other components within normal limits    Imaging Review Dg Chest 1 View  11/15/2014   CLINICAL DATA:  Shortness of breath, cough, history of lung cancer  EXAM: CHEST - 1 VIEW  COMPARISON:  07/28/2014  FINDINGS: Cardiomediastinal silhouette is stable. Right head that left shoulder prosthesis again noted. No acute infiltrate or pulmonary edema.  IMPRESSION: No active disease.   Electronically Signed   By: Lahoma Crocker M.D.   On: 11/15/2014 13:43   Dg Hip Complete Right  11/15/2014   CLINICAL DATA:  Right hip pain post fall 2 days ago at home  EXAM: RIGHT HIP - COMPLETE 2+ VIEW  COMPARISON:  01/04/2010  FINDINGS: Three views of the right hip submitted. No acute fracture or subluxation. There are degenerative changes with narrowing of superior joint space bilateral hip joint. Mild spurring bilateral femoral head.  Mild sclerosis of right superior acetabulum. Atherosclerotic calcifications of  femoral arteries.  IMPRESSION: No acute fracture or subluxation. Osteoarthritic changes as described above.   Electronically Signed   By: Lahoma Crocker M.D.   On: 11/15/2014 13:33   Dg Foot Complete Left  11/15/2014   CLINICAL DATA:  Left foot pain redness and swelling, fall 2 days ago at home  EXAM: LEFT FOOT - COMPLETE 3+ VIEW  COMPARISON:  01/21/2006  FINDINGS: Three views of the left foot submitted. No acute fracture or subluxation. Mild hallux valgus deformity. Diffuse osteopenia. Question old fracture of distal fourth metatarsal.  IMPRESSION: No acute fracture or subluxation. Diffuse osteopenia. Mild hallux valgus deformity. Question old fracture of distal aspect fourth metatarsal.   Electronically Signed   By: Lahoma Crocker M.D.   On: 11/15/2014 13:35      MDM   Final diagnoses:  Foot pain  Pain  Foot sprain, left, initial encounter  Hip sprain, right, initial encounter    Nursing notes including past medical history and social history reviewed and considered in documentation xrays/imaging reviewed by myself and considered during evaluation Labs/vital reviewed myself and considered during evaluation    I personally performed the services described in this documentation, which was scribed in my presence. The recorded information has been reviewed and is accurate.      Sharyon Cable, MD 11/15/14 [phone]

## 2014-11-15 NOTE — ED Notes (Signed)
PT c/o right hip pain and left foot pain x2 days from a fall on carpet at home. PT able to bear weight on leg but c/o extreme pain. PT has lung cancer and liver metastasis and is seen at [facility] cancer center.

## 2014-11-16 ENCOUNTER — Encounter: Payer: Self-pay | Admitting: *Deleted
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
## 2014-11-17 ENCOUNTER — Telehealth: Payer: Self-pay | Admitting: Internal Medicine

## 2014-11-17 NOTE — Telephone Encounter (Signed)
returned call and s.w pt and confirmed all pt appts...pt ok and aware

## 2014-11-17 NOTE — Telephone Encounter (Signed)
returned pt call no vm available....pt wants appts in [place]. her ct is sched there. I was not sure if she is trying to leave out facility.

## 2014-11-21 ENCOUNTER — Telehealth: Payer: Self-pay | Admitting: *Deleted

## 2014-11-21 ENCOUNTER — Encounter (HOSPITAL_COMMUNITY): Payer: Self-pay

## 2014-11-21 ENCOUNTER — Ambulatory Visit (HOSPITAL_COMMUNITY)
Admission: RE | Admit: 2014-11-21 | Discharge: 2014-11-21 | Disposition: A | Discharge status: Home / Self Care | Payer: Medicare Other | Source: Ambulatory Visit | Attending: Internal Medicine | Admitting: Internal Medicine

## 2014-11-21 ENCOUNTER — Telehealth: Payer: Self-pay | Admitting: Internal Medicine

## 2014-11-21 DIAGNOSIS — C787 Secondary malignant neoplasm of liver and intrahepatic bile duct: Secondary | ICD-10-CM | POA: Diagnosis not present

## 2014-11-21 DIAGNOSIS — Z923 Personal history of irradiation: Secondary | ICD-10-CM | POA: Insufficient documentation

## 2014-11-21 DIAGNOSIS — R911 Solitary pulmonary nodule: Secondary | ICD-10-CM | POA: Insufficient documentation

## 2014-11-21 DIAGNOSIS — R079 Chest pain, unspecified: Secondary | ICD-10-CM | POA: Insufficient documentation

## 2014-11-21 DIAGNOSIS — C3411 Malignant neoplasm of upper lobe, right bronchus or lung: Secondary | ICD-10-CM | POA: Insufficient documentation

## 2014-11-21 DIAGNOSIS — I313 Pericardial effusion (noninflammatory): Secondary | ICD-10-CM | POA: Diagnosis not present

## 2014-11-21 DIAGNOSIS — Z9221 Personal history of antineoplastic chemotherapy: Secondary | ICD-10-CM | POA: Insufficient documentation

## 2014-11-21 MED ORDER — IOHEXOL 300 MG/ML  SOLN
100.0000 mL | Freq: Once | INTRAMUSCULAR | Status: AC | PRN
  Administered 2014-11-21: 80 mL via INTRAVENOUS

## 2014-11-21 NOTE — Telephone Encounter (Signed)
Pt called stating she feels she has PNA, she had a CT scan done today and wants to see if Dr Vista Mink feels she needs an antibiotic.  Informed her that Dr Vista Mink has been informed, however the CT scan results are not back yet so we will call if we have to call in any antibiotics, pt denies fever.
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
## 2014-11-23 ENCOUNTER — Ambulatory Visit (HOSPITAL_BASED_OUTPATIENT_CLINIC_OR_DEPARTMENT_OTHER): Payer: Medicare Other | Admitting: Internal Medicine

## 2014-11-23 ENCOUNTER — Other Ambulatory Visit (HOSPITAL_BASED_OUTPATIENT_CLINIC_OR_DEPARTMENT_OTHER): Payer: Medicare Other

## 2014-11-23 ENCOUNTER — Encounter: Payer: Self-pay | Admitting: Internal Medicine

## 2014-11-23 VITALS — BP 156/76 | HR 74 | Temp 98.1°F | Resp 17 | Ht 66.0 in | Wt 166.5 lb

## 2014-11-23 DIAGNOSIS — E032 Hypothyroidism due to medicaments and other exogenous substances: Secondary | ICD-10-CM

## 2014-11-23 DIAGNOSIS — C3431 Malignant neoplasm of lower lobe, right bronchus or lung: Secondary | ICD-10-CM

## 2014-11-23 DIAGNOSIS — F17200 Nicotine dependence, unspecified, uncomplicated: Secondary | ICD-10-CM

## 2014-11-23 DIAGNOSIS — R5383 Other fatigue: Secondary | ICD-10-CM

## 2014-11-23 DIAGNOSIS — G8929 Other chronic pain: Secondary | ICD-10-CM

## 2014-11-23 DIAGNOSIS — C3411 Malignant neoplasm of upper lobe, right bronchus or lung: Secondary | ICD-10-CM

## 2014-11-23 DIAGNOSIS — C3491 Malignant neoplasm of unspecified part of right bronchus or lung: Secondary | ICD-10-CM

## 2014-11-23 DIAGNOSIS — J449 Chronic obstructive pulmonary disease, unspecified: Secondary | ICD-10-CM

## 2014-11-23 LAB — COMPREHENSIVE METABOLIC PANEL (CC13)
ALK PHOS: 134 U/L (ref 40–150)
ALT: 10 U/L (ref 0–55)
AST: 15 U/L (ref 5–34)
Albumin: 3.5 g/dL (ref 3.5–5.0)
Anion Gap: 9 mEq/L (ref 3–11)
BUN: 4.8 mg/dL — AB (ref 7.0–26.0)
CALCIUM: 8.6 mg/dL (ref 8.4–10.4)
CO2: 27 mEq/L (ref 22–29)
Chloride: 98 mEq/L (ref 98–109)
Creatinine: 0.8 mg/dL (ref 0.6–1.1)
Glucose: 193 mg/dl — ABNORMAL HIGH (ref 70–140)
POTASSIUM: 4.1 meq/L (ref 3.5–5.1)
Sodium: 134 mEq/L — ABNORMAL LOW (ref 136–145)
Total Bilirubin: 0.24 mg/dL (ref 0.20–1.20)
Total Protein: 6.9 g/dL (ref 6.4–8.3)

## 2014-11-23 LAB — CBC WITH DIFFERENTIAL/PLATELET
BASO%: 1.2 % (ref 0.0–2.0)
BASOS ABS: 0 10*3/uL (ref 0.0–0.1)
EOS%: 2.2 % (ref 0.0–7.0)
Eosinophils Absolute: 0.1 10*3/uL (ref 0.0–0.5)
HEMATOCRIT: 37.8 % (ref 34.8–46.6)
HEMOGLOBIN: 12.2 g/dL (ref 11.6–15.9)
LYMPH%: 15 % (ref 14.0–49.7)
MCH: 32 pg (ref 25.1–34.0)
MCHC: 32.4 g/dL (ref 31.5–36.0)
MCV: 98.7 fL (ref 79.5–101.0)
MONO#: 0.2 10*3/uL (ref 0.1–0.9)
MONO%: 5.5 % (ref 0.0–14.0)
NEUT%: 76.1 % (ref 38.4–76.8)
NEUTROS ABS: 3.2 10*3/uL (ref 1.5–6.5)
Platelets: 212 10*3/uL (ref 145–400)
RBC: 3.83 10*6/uL (ref 3.70–5.45)
RDW: 15.4 % — ABNORMAL HIGH (ref 11.2–14.5)
WBC: 4.3 10*3/uL (ref 3.9–10.3)
lymph#: 0.6 10*3/uL — ABNORMAL LOW (ref 0.9–3.3)

## 2014-11-23 LAB — TSH CHCC: TSH: 55.597 m(IU)/L — ABNORMAL HIGH (ref 0.308–3.960)

## 2014-11-23 IMAGING — CT CT CHEST W/ CM
2 of 3 series · 15 of 36 positions shown, 18 images · IV contrast (omnipaque)
Comparison: CT CHEST W/CM dated 01/03/2014; CT CHEST W/CM dated
12/31/2013

CLINICAL DATA: Followup lung cancer.  Chemotherapy complete.

EXAM:
CT CHEST WITH CONTRAST
TECHNIQUE: Multidetector CT imaging of the chest was performed during
intravenous contrast administration.
CONTRAST:  80mL OMNIPAQUE IOHEXOL 300 MG/ML  SOLN

[Series 2: chest with st · axial · 0.68mm/px · z∈[-162,+92]mm · 12 of 61 slices shown, 15 images]
[im 5/61  mediastinal]
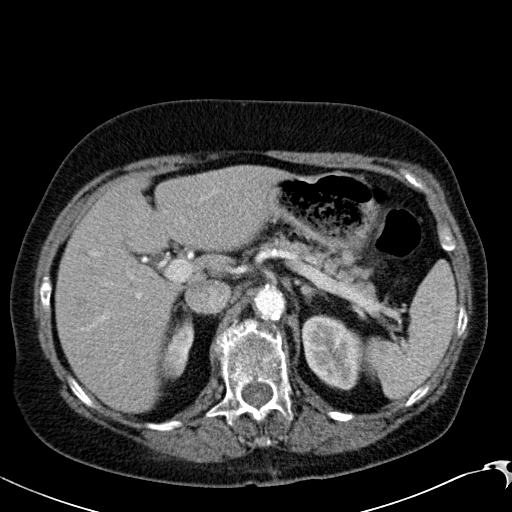
[im 5/61  lung]
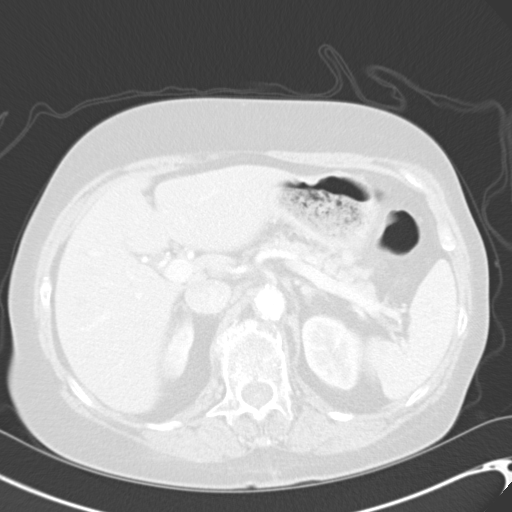
[im 9/61  lung]
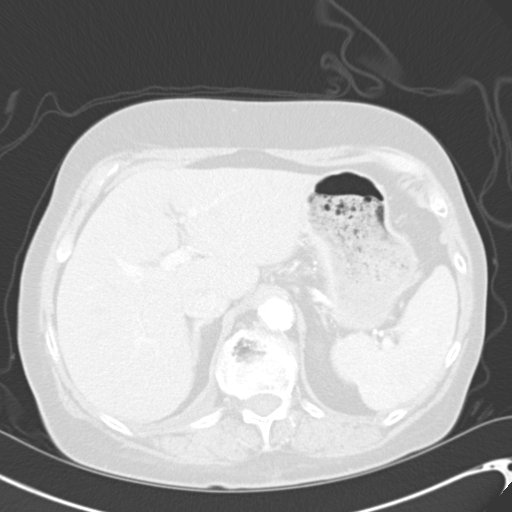
[im 14/61  lung]
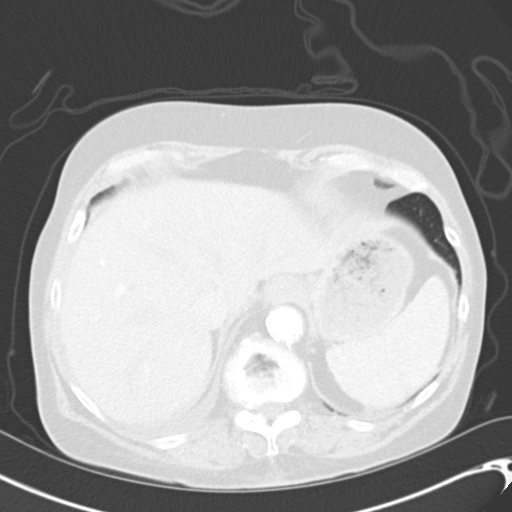
[im 18/61  lung]
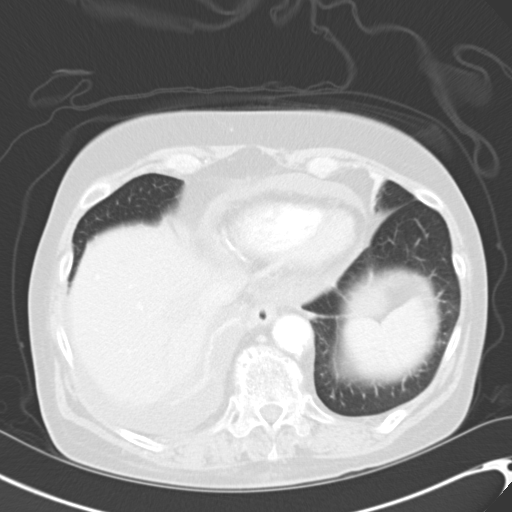
[im 23/61  mediastinal]
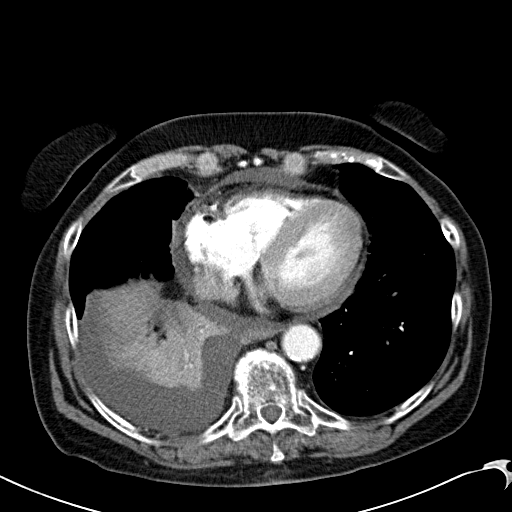
[im 23/61  lung]
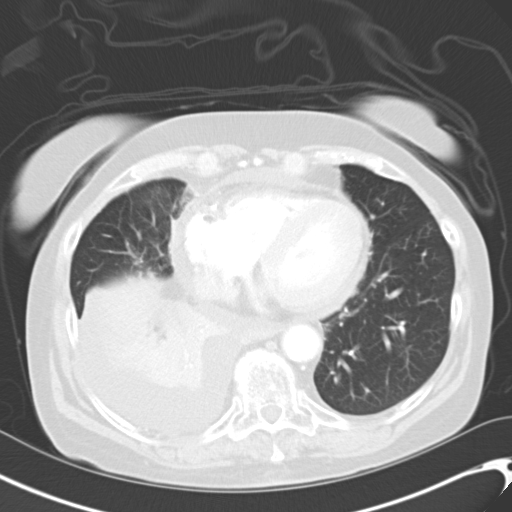
[im 27/61  lung]
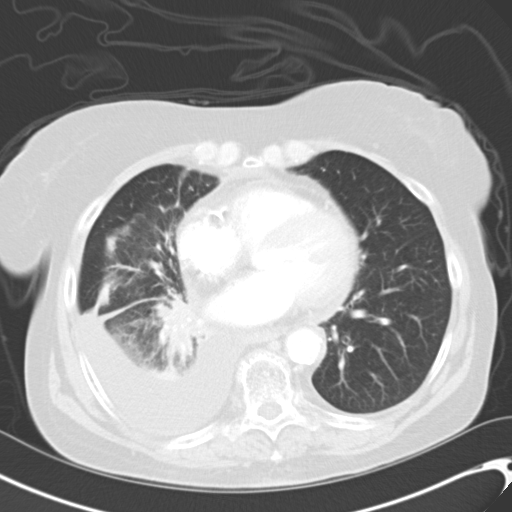
[im 34/61  lung]
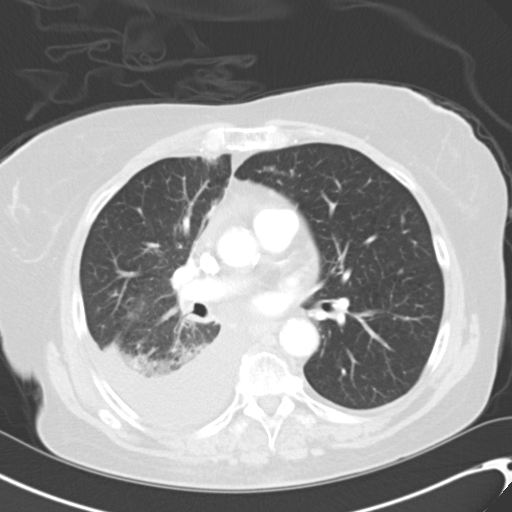
[im 38/61  lung]
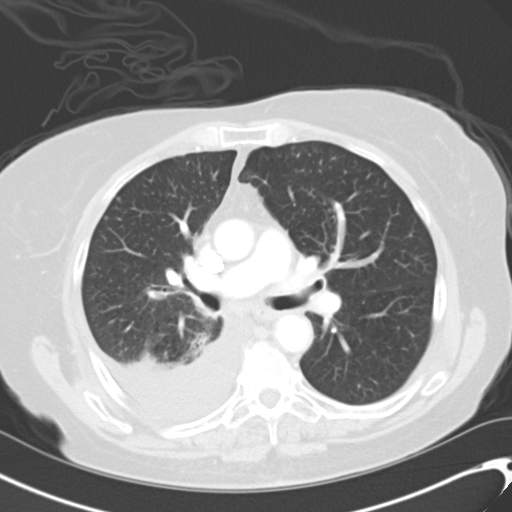
[im 43/61  mediastinal]
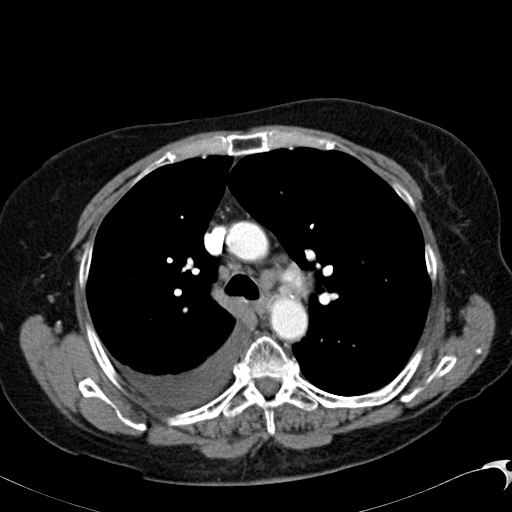
[im 43/61  lung]
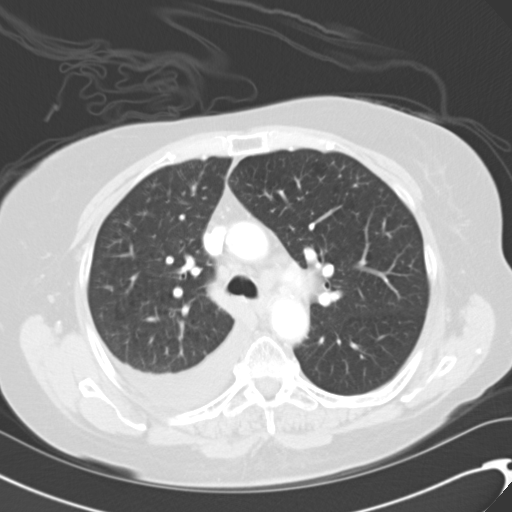
[im 47/61  lung]
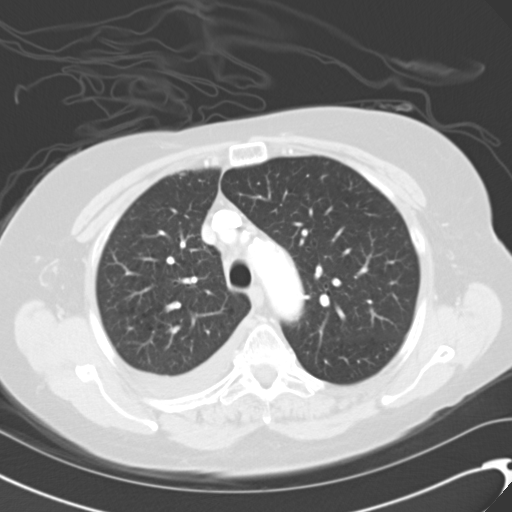
[im 52/61  lung]
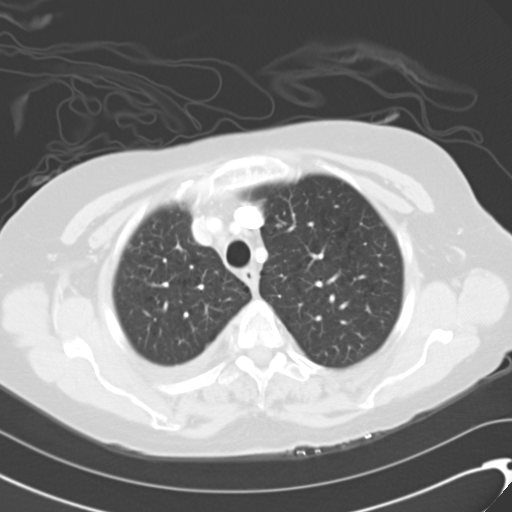
[im 56/61  lung]
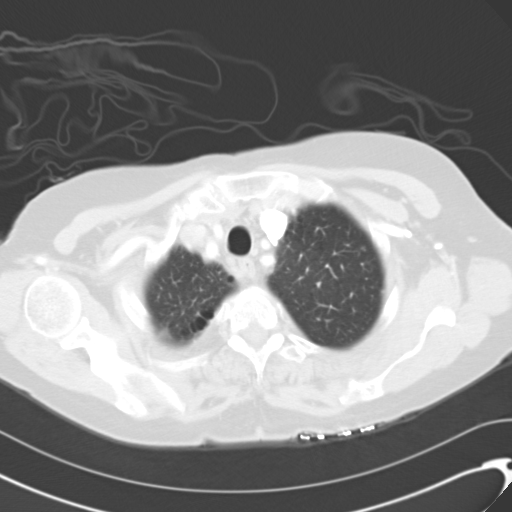

[Series 602: <mpr thick range> · coronal · 0.68mm/px · 3 of 78 slices shown]
[im 16/78  lung]
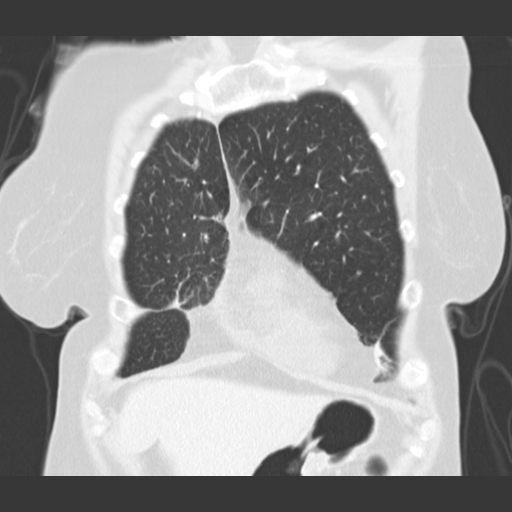
[im 31/78  lung]
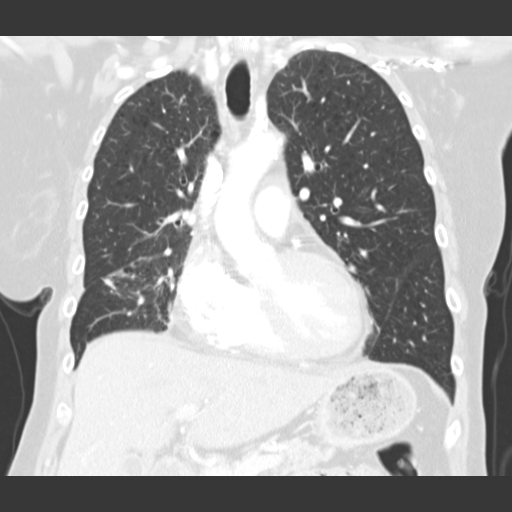
[im 47/78  lung]
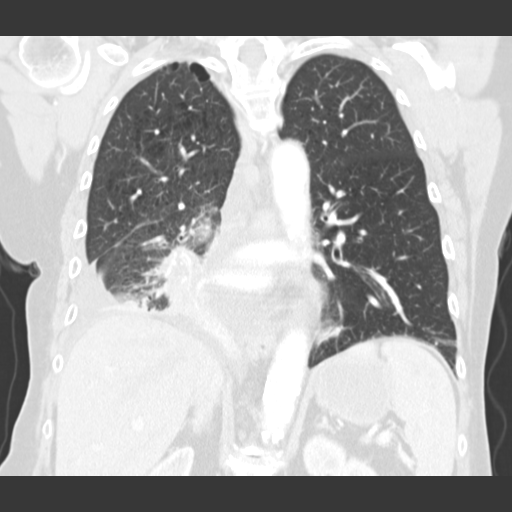

[15 of 36 positions shown; findings below may reference images not displayed]

FINDINGS: Mediastinal lymph nodes measure up to 11 mm in the low left
paratracheal station, stable. Soft tissue density in the right
infrahilar region has increased in prominence, however. Discrete
measurement is difficult, due to its irregular appearance.
Approximate measurement is 2.4 x 3.9 cm (previously 2.5 x 2.9 cm).
No left hilar or axillary adenopathy. Atherosclerotic calcification
of the arterial vasculature, including three-vessel involvement of
the coronary arteries. Heart size within normal limits. Small
pericardial effusion is new.

Small right pleural effusion, new. Centrilobular and paraseptal
emphysema, mild. Collapse/ consolidation in the right lower lobe.
The superior segmental right lower lobe bronchus is narrowed but
patent. Right lower lobe basal segmental bronchi are obstructed.
Right middle lobe bronchus is also extremely narrowed (image 30)
with new volume loss in the right middle lobe. Subpleural densities
in the medial aspect of the right upper lobe (images 26 and 29) are
new. Additional scattered subpleural nodules are grossly stable and
measure 4 mm or less in size in the right upper lobe. Probable small
subpleural lymph node along the left major fissure. No left pleural
fluid. Airway is otherwise grossly unremarkable.

Incidental imaging of the upper abdomen shows the visualized
portions of the liver, gallbladder, adrenal glands, kidneys, spleen,
pancreas, stomach and bowel to be grossly unremarkable. No upper
abdominal adenopathy. No worrisome lytic or sclerotic lesions.
Extensive degenerative changes in the spine. Pectus deformity.
IMPRESSION: 1. Enlarging right infrahilar mass with collapse/consolidation in
the right lower lobe and volume loss in the right middle lobe.
Together with a new small right pleural effusion and new small
pericardial effusion, findings are most indicative of progression of
the patient's lung cancer.
2. New subpleural densities in the medial aspect of the right upper
lobe are nonspecific but likely infectious or inflammatory in
etiology.
3. Three-vessel coronary artery calcification.

## 2014-11-23 MED ORDER — LEVOTHYROXINE SODIUM 50 MCG PO TABS: 50.0000 ug | ORAL_TABLET | Freq: Every day | ORAL | Status: DC

## 2014-11-23 MED ORDER — OXYCODONE-ACETAMINOPHEN 7.5-325 MG PO TABS: 1.0000 | ORAL_TABLET | Freq: Four times a day (QID) | ORAL | Status: DC | PRN

## 2014-11-23 NOTE — Patient Instructions (Signed)
Smoking Cessation, Tips for Success  If you are ready to quit smoking, congratulations! You have chosen to help yourself be healthier. Cigarettes bring nicotine, tar, carbon monoxide, and other irritants into your body. Your lungs, heart, and blood vessels will be able to work better without these poisons. There are many different ways to quit smoking. Nicotine gum, nicotine patches, a nicotine inhaler, or nicotine nasal spray can help with physical craving. Hypnosis, support groups, and medicines help break the habit of smoking.  WHAT THINGS CAN I DO TO MAKE QUITTING EASIER?   Here are some tips to help you quit for good:  · Pick a date when you will quit smoking completely. Tell all of your friends and family about your plan to quit on that date.  · Do not try to slowly cut down on the number of cigarettes you are smoking. Pick a quit date and quit smoking completely starting on that day.  · Throw away all cigarettes.    · Clean and remove all ashtrays from your home, work, and car.  · On a card, write down your reasons for quitting. Carry the card with you and read it when you get the urge to smoke.  · Cleanse your body of nicotine. Drink enough water and fluids to keep your urine clear or pale yellow. Do this after quitting to flush the nicotine from your body.  · Learn to predict your moods. Do not let a bad situation be your excuse to have a cigarette. Some situations in your life might tempt you into wanting a cigarette.  · Never have "just one" cigarette. It leads to wanting another and another. Remind yourself of your decision to quit.  · Change habits associated with smoking. If you smoked while driving or when feeling stressed, try other activities to replace smoking. Stand up when drinking your coffee. Brush your teeth after eating. Sit in a different chair when you read the paper. Avoid alcohol while trying to quit, and try to drink fewer caffeinated beverages. Alcohol and caffeine may urge you to  smoke.  · Avoid foods and drinks that can trigger a desire to smoke, such as sugary or spicy foods and alcohol.  · Ask people who smoke not to smoke around you.  · Have something planned to do right after eating or having a cup of coffee. For example, plan to take a walk or exercise.  · Try a relaxation exercise to calm you down and decrease your stress. Remember, you may be tense and nervous for the first 2 weeks after you quit, but this will pass.  · Find new activities to keep your hands busy. Play with a pen, coin, or rubber band. Doodle or draw things on paper.  · Brush your teeth right after eating. This will help cut down on the craving for the taste of tobacco after meals. You can also try mouthwash.    · Use oral substitutes in place of cigarettes. Try using lemon drops, carrots, cinnamon sticks, or chewing gum. Keep them handy so they are available when you have the urge to smoke.  · When you have the urge to smoke, try deep breathing.  · Designate your home as a nonsmoking area.  · If you are a heavy smoker, ask your health care provider about a prescription for nicotine chewing gum. It can ease your withdrawal from nicotine.  · Reward yourself. Set aside the cigarette money you save and buy yourself something nice.  · Look for   support from others. Join a support group or smoking cessation program. Ask someone at home or at work to help you with your plan to quit smoking.  · Always ask yourself, "Do I need this cigarette or is this just a reflex?" Tell yourself, "Today, I choose not to smoke," or "I do not want to smoke." You are reminding yourself of your decision to quit.  · Do not replace cigarette smoking with electronic cigarettes (commonly called e-cigarettes). The safety of e-cigarettes is unknown, and some may contain harmful chemicals.  · If you relapse, do not give up! Plan ahead and think about what you will do the next time you get the urge to smoke.  HOW WILL I FEEL WHEN I QUIT SMOKING?  You  may have symptoms of withdrawal because your body is used to nicotine (the addictive substance in cigarettes). You may crave cigarettes, be irritable, feel very hungry, cough often, get headaches, or have difficulty concentrating. The withdrawal symptoms are only temporary. They are strongest when you first quit but will go away within 10-14 days. When withdrawal symptoms occur, stay in control. Think about your reasons for quitting. Remind yourself that these are signs that your body is healing and getting used to being without cigarettes. Remember that withdrawal symptoms are easier to treat than the major diseases that smoking can cause.   Even after the withdrawal is over, expect periodic urges to smoke. However, these cravings are generally short lived and will go away whether you smoke or not. Do not smoke!  WHAT RESOURCES ARE AVAILABLE TO HELP ME QUIT SMOKING?  Your health care provider can direct you to community resources or hospitals for support, which may include:  · Group support.  · Education.  · Hypnosis.  · Therapy.  Document Released: 09/13/2004 Document Revised: 05/02/2014 Document Reviewed: 06/03/2013  [employer]® Patient Information ©2015 [employer]. This information is not intended to replace advice given to you by your health care provider. Make sure you discuss any questions you have with your health care provider.

## 2014-11-23 NOTE — Progress Notes (Signed)
[place] [phone]   [phone]  OFFICE PROGRESS NOTE  No PCP Per Patient No address on file  DIAGNOSIS: Metastatic non-small cell lung cancer initially diagnosed as Locally advanced, likely stage IIIA (T2a., N2, M0) non-small cell lung cancer, squamous cell carcinoma diagnosed in September of 2014.   PRIOR THERAPY:  1) Concurrent chemoradiation with weekly carboplatin for AUC of 2 and paclitaxel 45 mg/M2, status post 7 cycles, last cycle was given 11/29/2013 with partial response. First dose was given on 10/18/2013.   2) Systemic chemotherapy with carboplatin for AUC of 5 and paclitaxel 175 mg/M2 every 3 weeks with Neulasta support. First cycle on 04/06/2014. Status post 2 cycles.  CURRENT THERAPY: Immunotherapy with Nivolumab 3 mg/KG every 2 weeks. First cycle 09/27/2014. Status post 3 cycles.  CHEMOTHERAPY INTENT: Curative/control  CURRENT # OF CHEMOTHERAPY CYCLES: 4 CURRENT ANTIEMETICS: Zofran, Compazine and dexamethasone  CURRENT SMOKING STATUS: Current smoker and she was strongly advised to quit smoking and also to smoke cessation program  ORAL CHEMOTHERAPY AND CONSENT: None  CURRENT BISPHOSPHONATES USE: None  PAIN MANAGEMENT: 0/10  NARCOTICS INDUCED CONSTIPATION: None  LIVING WILL AND CODE STATUS: Full code  INTERVAL HISTORY: [name] 68 y.o. female returns to the clinic today for follow up visit accompanied by her cousin. The patient is feeling fine and tolerating her immunotherapy with Nivolumab fairly well. She was recently 5 by her primary care physician because of noncompliance with the pain medication. She is currently in the process of finding another primary care physician. She has an appointment with the pain clinic in early December 2015. She denied having any significant chest pain but continues to have shortness of breath with exertion with no hemoptysis. She denied having any significant weight loss or night sweats.  She had repeat CT scan of the chest performed recently and she is here for evaluation and discussion of her scan results. She is requesting refill of her pain medication until she is seen by the pain clinic.  MEDICAL HISTORY: Past Medical History  Diagnosis Date  . H/O: stroke 2012, 2000,     X3  . Hypertension   . Anxiety   . PTSD (post-traumatic stress disorder)   . Depression   . SVD (spontaneous vaginal delivery)     x 1  . Smoker   . Osteoarthritis     knees, hips hands,   . Shortness of breath   . Pain     SEVERE PAIN BACK, RIGHT HIP AND KNEES--HARRINGTON RODS AND CERVICAL PLATES--USES WALKER OR CANE WHEN AMBULATING - GOES TO A PAIN CLINIC-STATES SHE NEEDS RT HIP AND BOTH KNEES REPLACED. PT STATES SHE WAS TOLD THE CEMENT AROUND THE HARRINGTON RODS IS CRACKED.  [name] Hot flashes, menopausal     SEVERE  . Complication of anesthesia 03/02/13    severe headache,vertigo postop  . COPD (chronic obstructive pulmonary disease)     stable  . 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 Lung cancer   . Status post radiation therapy within four to twelve weeks 10/18/13-12/06/13    lung ca  66Gy  . Family history of anesthesia complication     equipment failure caused problem, suffered a stroke & heartattack    ALLERGIES:  is allergic to carafate; prednisone; nsaids; aspirin; ibuprofen; and dulera.  MEDICATIONS:  Current Outpatient Prescriptions  Medication Sig Dispense Refill  . Aclidinium Bromide (TUDORZA PRESSAIR) 400 MCG/ACT AEPB Inhale 1 puff into the lungs 2 (  two) times daily. 1 each 0  . albuterol (PROVENTIL) (2.5 MG/3ML) 0.083% nebulizer solution Take 2.5 mg by nebulization as needed for wheezing.    [name] albuterol (VENTOLIN HFA) 108 (90 BASE) MCG/ACT inhaler Inhale 2 puffs into the lungs every 6 (six) hours as needed for wheezing or shortness of breath. 1 Inhaler 0  . ALPRAZolam (XANAX) 1 MG tablet Take 1 tablet (1 mg total) by mouth every 6 (six) hours as needed for anxiety. 30 tablet 0  .  atenolol (TENORMIN) 50 MG tablet Take 1 tablet (50 mg total) by mouth daily. 30 tablet 0  . benzonatate (TESSALON) 200 MG capsule Take 1 capsule (200 mg total) by mouth 3 (three) times daily as needed for cough. 20 capsule 0  . chlorzoxazone (PARAFON) 500 MG tablet Take 500 mg by mouth 4 (four) times daily as needed for muscle spasms.    [name] dextromethorphan-guaiFENesin (MUCINEX DM) 30-600 MG per 12 hr tablet Take 1 tablet by mouth every 12 (twelve) hours.    [name] esomeprazole (NEXIUM) 40 MG capsule Take 40 mg by mouth daily as needed.     . ondansetron (ZOFRAN ODT) 4 MG disintegrating tablet Take 1 tablet (4 mg total) by mouth every 8 (eight) hours as needed for nausea or vomiting. 20 tablet 0  . oxyCODONE-acetaminophen (PERCOCET) 7.5-325 MG per tablet Take 1 tablet by mouth every 6 (six) hours as needed for pain. (Patient taking differently: Take 1 tablet by mouth every 6 (six) hours as needed for pain. Oxycodone 5/325 mg) 15 tablet 0  . promethazine (PHENERGAN) 25 MG tablet Take 25 mg by mouth every 6 (six) hours as needed for nausea or vomiting.    . sertraline (ZOLOFT) 100 MG tablet Take 200 mg by mouth daily.     . trazodone (DESYREL) 300 MG tablet Take 300 mg by mouth at bedtime.     No current facility-administered medications for this visit.    SURGICAL HISTORY:  Past Surgical History  Procedure Laterality Date  . Carpal tunnel release      BILATERAL  . Nasal sinus surgery    . Lumbar fusion      L4-S1  . Cervical fusion    . Hernia repair    . Salpingoophorectomy Right   . Vaginal hysterectomy      TAH w/ ovary removal  . Transthoracic echocardiogram  12-03-2011    LVSF NORMAL/ EF 60-65%  . Vulvectomy N/A 03/02/2013    Procedure: WIDE LOCAL EXCISION VULVAR;  Surgeon: Imagene Gurney A. Alycia Rossetti, MD;  Location: [facility];  Service: Gynecology;  Laterality: N/A;  . Co2 laser application N/A 01/07/3234    Procedure: LASER APPLICATION OF THE VULVA;  Surgeon: Janie Morning, MD;  Location: [facility];  Service: Gynecology;  Laterality: N/A;  . Video bronchoscopy Bilateral 09/23/2013    Procedure: VIDEO BRONCHOSCOPY WITHOUT FLUORO;  Surgeon: Tanda Rockers, MD;  Location: [facility];  Service: Cardiopulmonary;  Laterality: Bilateral;  . Hemiarthroplasty shoulder fracture Left 05/25/2014    DR CHANDLER  . Reverse shoulder arthroplasty Left 05/24/2014    Procedure:   Hemi-Arthroplasty  Left Shoulder;  Surgeon: Nita Sells, MD;  Location: [facility];  Service: Orthopedics;  Laterality: Left;    REVIEW OF SYSTEMS:  Constitutional: positive for fatigue Eyes: negative Ears, nose, mouth, throat, and face: negative Respiratory: positive for cough, dyspnea on exertion and sputum Cardiovascular: negative Gastrointestinal: negative Genitourinary:negative Integument/breast: negative Hematologic/lymphatic: negative Musculoskeletal:positive for muscle weakness Neurological: negative Behavioral/Psych: negative Endocrine: negative  Allergic/Immunologic: negative   PHYSICAL EXAMINATION: General appearance: alert, cooperative, fatigued and no distress Head: Normocephalic, without obvious abnormality, atraumatic Neck: no adenopathy, no JVD, supple, symmetrical, trachea midline and thyroid not enlarged, symmetric, no tenderness/mass/nodules Lymph nodes: Cervical, supraclavicular, and axillary nodes normal. Resp: wheezes bilaterally Back: symmetric, no curvature. ROM normal. No CVA tenderness. Cardio: regular rate and rhythm, S1, S2 normal, no murmur, click, rub or gallop GI: soft, non-tender; bowel sounds normal; no masses,  no organomegaly Extremities: extremities normal, atraumatic, no cyanosis or edema Neurologic: Alert and oriented X 3, normal strength and tone. Normal symmetric reflexes. Normal coordination and gait  ECOG PERFORMANCE STATUS: 2 - Symptomatic, <50% confined to bed  Blood pressure 156/76, pulse 74, temperature 98.1 F (36.7 C), temperature source Oral,  resp. rate 17, height 5\' 6"  (1.676 m), weight 166 lb 8 oz (75.524 kg), SpO2 95 %.  LABORATORY DATA: Lab Results  Component Value Date   WBC 4.3 11/23/2014   HGB 12.2 11/23/2014   HCT 37.8 11/23/2014   MCV 98.7 11/23/2014   PLT 212 11/23/2014      Chemistry      Component Value Date/Time   NA 136* 11/15/2014 1226   NA 138 11/08/2014 1145   K 4.6 11/15/2014 1226   K 3.9 11/08/2014 1145   CL 99 11/15/2014 1226   CO2 28 11/15/2014 1226   CO2 26 11/08/2014 1145   BUN 6 11/15/2014 1226   BUN 5.2* 11/08/2014 1145   CREATININE 0.74 11/15/2014 1226   CREATININE 0.8 11/08/2014 1145      Component Value Date/Time   CALCIUM 8.3* 11/15/2014 1226   CALCIUM 8.4 11/08/2014 1145   ALKPHOS 126 11/08/2014 1145   ALKPHOS 146* 04/20/2014 1108   AST 14 11/08/2014 1145   AST 15 04/20/2014 1108   ALT 11 11/08/2014 1145   ALT 7 04/20/2014 1108   BILITOT <0.20 11/08/2014 1145   BILITOT 0.2* 04/20/2014 1108       RADIOGRAPHIC STUDIES: Dg Chest 1 View  11/15/2014   CLINICAL DATA:  Shortness of breath, cough, history of lung cancer  EXAM: CHEST - 1 VIEW  COMPARISON:  07/28/2014  FINDINGS: Cardiomediastinal silhouette is stable. Right head that left shoulder prosthesis again noted. No acute infiltrate or pulmonary edema.  IMPRESSION: No active disease.   Electronically Signed   By: Lahoma Crocker M.D.   On: 11/15/2014 13:43   Dg Hip Complete Right  11/15/2014   CLINICAL DATA:  Right hip pain post fall 2 days ago at home  EXAM: RIGHT HIP - COMPLETE 2+ VIEW  COMPARISON:  01/04/2010  FINDINGS: Three views of the right hip submitted. No acute fracture or subluxation. There are degenerative changes with narrowing of superior joint space bilateral hip joint. Mild spurring bilateral femoral head. Mild sclerosis of right superior acetabulum. Atherosclerotic calcifications of femoral arteries.  IMPRESSION: No acute fracture or subluxation. Osteoarthritic changes as described above.   Electronically Signed    By: Lahoma Crocker M.D.   On: 11/15/2014 13:33   Ct Chest W Contrast  11/21/2014   CLINICAL DATA:  Stage IIIA non-small cell lung cancer. Liver cancer 2 years ago. Status post chemotherapy and radiation therapy. Left anterior chest pain since 3 days ago. Hypertension and asthma.  EXAM: CT CHEST WITH CONTRAST  TECHNIQUE: Multidetector CT imaging of the chest was performed during intravenous contrast administration.  CONTRAST:  43mL OMNIPAQUE IOHEXOL 300 MG/ML  SOLN  COMPARISON:  Chest radiograph 11/15/2014. Most recent CT of 09/15/2014. Clinic  note of 11/08/2014.  FINDINGS: Lungs/Pleura: Favor fluid within the bronchus intermedius dependently. Example image 29 of series 3. This is new or increased. Right lower lobe endobronchial compression or obstruction is similar.  Moderate centrilobular emphysema. Similar 3 mm right upper lobe lung nodule on image 22. Right infrahilar spiculated nodule surrounding and obstructing right lower lobe bronchi measures 1.8 x 1.8 cm on image 34. This is unchanged (when remeasured).  Patchy paramediastinal right upper and right lower lobe airspace disease is primarily similar. A right upper lobe 12 mm nodule with surrounding ground-glass opacity on the prior exam has resolved. Patchy new paramediastinal left upper lobe ground-glass and airspace opacities are mild. Example image 24 of series 3.  Right-sided pleural fluid is decreased, trace. There is trace left pleural fluid or thickening which is not significantly changed.  Heart/Mediastinum: Beam hardening artifact from left shoulder arthroplasty. Aortic and branch vessel atherosclerosis. Left subclavian artery atherosclerosis which is on the order of 40% on image fourteen. Mild cardiomegaly with trace anterior pericardial fluid or thickening, new. Multivessel coronary artery atherosclerosis. Middle mediastinal nodes. A 9 mm AP window node is similar to on the prior exam. Low right paratracheal node measures 9 mm and is also not  significantly changed. No left hilar adenopathy.  Upper Abdomen: Normal imaged portions of the pancreas, spleen, stomach, gallbladder, adrenal glands. The lateral segment left liver lobe 11 mm focus of hyper attenuation is less conspicuous today. 1.0 cm on image 49.  No new liver lesions are identified. Advanced abdominal aortic and branch vessel atherosclerosis.  Bones/Musculoskeletal: Moderate osteopenia. Lower cervical spine fixation. Removal lower left rib trauma.  IMPRESSION: 1. Right infrahilar/central right lower lobe nodule is unchanged. This causes similar narrowing/obstruction of right lower lobe bronchi. 2. paramediastinal opacities which are likely radiation induced. A right upper lobe nodular density has resolved since the prior exam and was likely inflammatory. Response of a metastatic lesion felt less likely. 3. Decreased  trace right-sided pleural fluid. 4. New trace pericardial effusion. 5. Decreased conspicuity of a the left hepatic lobe low-density focus. Indeterminate but may represent response to therapy of metastatic disease.   Electronically Signed   By: Abigail Miyamoto M.D.   On: 11/21/2014 16:54   Dg Foot Complete Left  11/15/2014   CLINICAL DATA:  Left foot pain redness and swelling, fall 2 days ago at home  EXAM: LEFT FOOT - COMPLETE 3+ VIEW  COMPARISON:  01/21/2006  FINDINGS: Three views of the left foot submitted. No acute fracture or subluxation. Mild hallux valgus deformity. Diffuse osteopenia. Question old fracture of distal fourth metatarsal.  IMPRESSION: No acute fracture or subluxation. Diffuse osteopenia. Mild hallux valgus deformity. Question old fracture of distal aspect fourth metatarsal.   Electronically Signed   By: Lahoma Crocker M.D.   On: 11/15/2014 13:35    ASSESSMENT AND PLAN: This is a very pleasant 68 years old white female with:  1) Stage IIIA non-small cell lung cancer completed a course of concurrent chemoradiation with weekly carboplatin and paclitaxel and  tolerating her treatment fairly well. She completed 2 more cycles of systemic chemotherapy with carboplatin and paclitaxel every 3 weeks but her treatment was discontinued after the patient had surgery for the left shoulder She had evidence for disease progression and the patient was started on treatment with immunotherapy with Nivolumab 3 MG/KG and tolerating her treatment fairly well. She is status post 4 cycles. The recent CT scan of the chest showed no significant evidence for disease progression. I discussed  the scan results with the patient and her cousin. I recommended for her to continue her current treatment with Nivolumab. We will proceed with cycle #5 today as scheduled.   2) chronic back pain: The patient has this issues for over 30 years before her diagnosis with lung cancer. She has pain receiving her pain medication by her primary care physician but she is not seeing him anymore. She is scheduled to have an appointment with the pain clinic on 11/29/2014. I will give her a refill of her pain medication with Percocet 7.5/325 mg by mouth every 6 hours #24 the next few days until she is seen at the pain clinic. The patient understands that I cannot continue to refill his pain medication.  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 TSH is elevated today. I will start the patient on levothyroxine 50 g by mouth daily. We will continue to monitor her TSH closely.  4) primary care issues: She was recently fired by her primary care physician for noncompliance with her pain medication. I gave the patient information of other primary care physician in [place] who may be accepting new patient to establish care with one of them.  5) smoke cessation: I strongly encouraged the patient to quit smoking and offered her to smoke cessation program.  She was advised to call immediately if she has any concerning symptoms in the interval. The patient voices  understanding of current disease status and treatment options and is in agreement with the current care plan.  All questions were answered. The patient knows to call the clinic with any problems, questions or concerns. We can certainly see the patient much sooner if necessary.  Disclaimer: This note was dictated with voice recognition software. Similar sounding words can inadvertently be transcribed and may not be corrected upon review.

## 2014-11-23 NOTE — Progress Notes (Signed)
Quick Note:  Call patient with the result and I will order Synthroid 50 mcg po qd ______

## 2014-11-25 ENCOUNTER — Telehealth: Payer: Self-pay | Admitting: *Deleted

## 2014-11-25 ENCOUNTER — Telehealth: Payer: Self-pay | Admitting: Internal Medicine

## 2014-11-25 ENCOUNTER — Other Ambulatory Visit: Payer: Self-pay | Admitting: Internal Medicine

## 2014-11-25 NOTE — Telephone Encounter (Signed)
Called to see if patient has picked up prescription for synthroid. Patient stated pharmacy is suppose to deliver today 11/25/14 and she will start taking Synthroid 50mg  in the morning 11/26/14.

## 2014-11-25 NOTE — Telephone Encounter (Signed)
s.w. pt and advised on DECappt changed...pt ok and aware of new d.t

## 2014-11-25 NOTE — Telephone Encounter (Signed)
-----   Message from Curt Bears, MD sent at 11/23/2014  5:39 PM EST ----- Call patient with the result and I will order Synthroid 50 mcg po qd

## 2014-11-25 NOTE — Telephone Encounter (Signed)
-----   Message from Carlton Adam, PA-C sent at 11/25/2014  3:55 PM EST ----- Abnormal results, please call in following prescription and notify patient. Please be sure patient picked up and is taking Synthroid

## 2014-11-25 NOTE — Telephone Encounter (Signed)
Called and informed patient about synthroid rx.  Pt verbalized understanding.

## 2014-11-28 ENCOUNTER — Telehealth (HOSPITAL_COMMUNITY): Payer: Self-pay | Admitting: *Deleted

## 2014-11-28 NOTE — Telephone Encounter (Signed)
Pt called and left message on voicemail stating she is running out of her medications and would like to know if our provider could refill her medications. Pt will be a new pt for Dr. Harrington Challenger 01-23-15. Called pt back and informed her not being seen at our office yet, provider will be unable to refill medications without being seen first. Pt agreed and showed understanding and will go to the office that referred her to our office to get more refills.

## 2014-11-28 NOTE — Telephone Encounter (Signed)
noted 

## 2014-11-30 ENCOUNTER — Telehealth: Payer: Self-pay | Admitting: *Deleted

## 2014-11-30 NOTE — Telephone Encounter (Signed)
Pt called requesting a refill for her xanax and parafon for muscle spasms.  She states she has an MRI tomorrow that her orthopedic MD has ordered and she cannot go through it w/o something for anxiety and for spasms.  She states she will not be going to the pain clinic and will not her a PCP until Jan 2016.  Per Dr Vista Mink, for her xanax and parafon she will need to go to urgent care, he will not refill them at this time.  She verbalized understanding.

## 2014-12-07 ENCOUNTER — Ambulatory Visit: Payer: Medicare Other

## 2014-12-07 ENCOUNTER — Encounter (HOSPITAL_COMMUNITY): Payer: Self-pay | Admitting: Psychology

## 2014-12-07 ENCOUNTER — Other Ambulatory Visit: Payer: Medicare Other

## 2014-12-07 ENCOUNTER — Ambulatory Visit: Payer: Medicare Other | Admitting: Internal Medicine

## 2014-12-07 ENCOUNTER — Other Ambulatory Visit: Payer: Self-pay | Admitting: *Deleted

## 2014-12-07 ENCOUNTER — Telehealth: Payer: Self-pay | Admitting: Internal Medicine

## 2014-12-07 NOTE — Telephone Encounter (Signed)
pt called to r/s appt due to transportation...done....pt aware of new d.t

## 2014-12-09 ENCOUNTER — Other Ambulatory Visit (HOSPITAL_BASED_OUTPATIENT_CLINIC_OR_DEPARTMENT_OTHER): Payer: Medicare Other

## 2014-12-09 ENCOUNTER — Ambulatory Visit (HOSPITAL_BASED_OUTPATIENT_CLINIC_OR_DEPARTMENT_OTHER): Payer: Medicare Other | Admitting: Physician Assistant

## 2014-12-09 ENCOUNTER — Telehealth: Payer: Self-pay | Admitting: *Deleted

## 2014-12-09 ENCOUNTER — Telehealth: Payer: Self-pay | Admitting: Physician Assistant

## 2014-12-09 ENCOUNTER — Encounter: Payer: Self-pay | Admitting: Physician Assistant

## 2014-12-09 ENCOUNTER — Ambulatory Visit (HOSPITAL_BASED_OUTPATIENT_CLINIC_OR_DEPARTMENT_OTHER): Payer: Medicare Other

## 2014-12-09 VITALS — BP 132/63 | HR 73 | Temp 98.3°F | Resp 18 | Ht 66.0 in | Wt 169.4 lb

## 2014-12-09 DIAGNOSIS — C3491 Malignant neoplasm of unspecified part of right bronchus or lung: Secondary | ICD-10-CM

## 2014-12-09 DIAGNOSIS — C342 Malignant neoplasm of middle lobe, bronchus or lung: Secondary | ICD-10-CM

## 2014-12-09 DIAGNOSIS — E038 Other specified hypothyroidism: Secondary | ICD-10-CM

## 2014-12-09 DIAGNOSIS — M549 Dorsalgia, unspecified: Secondary | ICD-10-CM

## 2014-12-09 DIAGNOSIS — C3431 Malignant neoplasm of lower lobe, right bronchus or lung: Secondary | ICD-10-CM

## 2014-12-09 DIAGNOSIS — G4700 Insomnia, unspecified: Secondary | ICD-10-CM

## 2014-12-09 DIAGNOSIS — Z5112 Encounter for antineoplastic immunotherapy: Secondary | ICD-10-CM

## 2014-12-09 DIAGNOSIS — C3411 Malignant neoplasm of upper lobe, right bronchus or lung: Secondary | ICD-10-CM

## 2014-12-09 LAB — CBC WITH DIFFERENTIAL/PLATELET
BASO%: 1.1 % (ref 0.0–2.0)
Basophils Absolute: 0.1 10*3/uL (ref 0.0–0.1)
EOS ABS: 0.1 10*3/uL (ref 0.0–0.5)
EOS%: 2.9 % (ref 0.0–7.0)
HCT: 40.7 % (ref 34.8–46.6)
HGB: 13.1 g/dL (ref 11.6–15.9)
LYMPH%: 18.3 % (ref 14.0–49.7)
MCH: 31.8 pg (ref 25.1–34.0)
MCHC: 32.2 g/dL (ref 31.5–36.0)
MCV: 98.9 fL (ref 79.5–101.0)
MONO#: 0.3 10*3/uL (ref 0.1–0.9)
MONO%: 5.2 % (ref 0.0–14.0)
NEUT#: 3.7 10*3/uL (ref 1.5–6.5)
NEUT%: 72.5 % (ref 38.4–76.8)
PLATELETS: 174 10*3/uL (ref 145–400)
RBC: 4.12 10*6/uL (ref 3.70–5.45)
RDW: 15.5 % — AB (ref 11.2–14.5)
WBC: 5.1 10*3/uL (ref 3.9–10.3)
lymph#: 0.9 10*3/uL (ref 0.9–3.3)

## 2014-12-09 LAB — COMPREHENSIVE METABOLIC PANEL (CC13)
ALT: 12 U/L (ref 0–55)
AST: 15 U/L (ref 5–34)
Albumin: 3.7 g/dL (ref 3.5–5.0)
Alkaline Phosphatase: 138 U/L (ref 40–150)
Anion Gap: 10 mEq/L (ref 3–11)
BUN: 8.8 mg/dL (ref 7.0–26.0)
CALCIUM: 8.4 mg/dL (ref 8.4–10.4)
CO2: 27 meq/L (ref 22–29)
Chloride: 96 mEq/L — ABNORMAL LOW (ref 98–109)
Creatinine: 0.9 mg/dL (ref 0.6–1.1)
EGFR: 63 mL/min/{1.73_m2} — AB (ref >90)
Glucose: 259 mg/dl — ABNORMAL HIGH (ref 70–140)
Potassium: 4.2 mEq/L (ref 3.5–5.1)
SODIUM: 134 meq/L — AB (ref 136–145)
TOTAL PROTEIN: 7.1 g/dL (ref 6.4–8.3)
Total Bilirubin: 0.52 mg/dL (ref 0.20–1.20)

## 2014-12-09 MED ORDER — SODIUM CHLORIDE 0.9 % IV SOLN
Freq: Once | INTRAVENOUS | Status: AC
  Administered 2014-12-09: 13:00:00 via INTRAVENOUS

## 2014-12-09 MED ORDER — SODIUM CHLORIDE 0.9 % IV SOLN
3.0000 mg/kg | Freq: Once | INTRAVENOUS | Status: AC
  Administered 2014-12-09: 220 mg via INTRAVENOUS
  Filled 2014-12-09: qty 22

## 2014-12-09 NOTE — Telephone Encounter (Signed)
Pt confirmed labs/ov per 12/11 POF, gave pt AVS..... KJ, sent msg to add chemo

## 2014-12-09 NOTE — Telephone Encounter (Signed)
Per staff message and POF I have scheduled appts. Advised scheduler of appts. JMW  

## 2014-12-09 NOTE — Patient Instructions (Signed)
Follow-up in 2 weeks

## 2014-12-09 NOTE — Progress Notes (Signed)
[place] [phone]   [phone]  OFFICE PROGRESS NOTE  No PCP Per Patient No address on file  DIAGNOSIS: Metastatic non-small cell lung cancer initially diagnosed as Locally advanced, likely stage IIIA (T2a., N2, M0) non-small cell lung cancer, squamous cell carcinoma diagnosed in September of 2014.   PRIOR THERAPY:  1) Concurrent chemoradiation with weekly carboplatin for AUC of 2 and paclitaxel 45 mg/M2, status post 7 cycles, last cycle was given 11/29/2013 with partial response. First dose was given on 10/18/2013.   2) Systemic chemotherapy with carboplatin for AUC of 5 and paclitaxel 175 mg/M2 every 3 weeks with Neulasta support. First cycle on 04/06/2014. Status post 2 cycles.  CURRENT THERAPY: Immunotherapy with Nivolumab 3 mg/KG every 2 weeks. First cycle 09/27/2014. Status post 4 cycles.  CHEMOTHERAPY INTENT: Curative/control  CURRENT # OF CHEMOTHERAPY CYCLES: 5 CURRENT ANTIEMETICS: Zofran, Compazine and dexamethasone  CURRENT SMOKING STATUS: Current smoker and she was strongly advised to quit smoking and also to smoke cessation program  ORAL CHEMOTHERAPY AND CONSENT: None  CURRENT BISPHOSPHONATES USE: None  PAIN MANAGEMENT: 0/10  NARCOTICS INDUCED CONSTIPATION: None  LIVING WILL AND CODE STATUS: Full code  INTERVAL HISTORY: [name] 68 y.o. female returns to the clinic today for follow up visit accompanied by her cousin. The patient is feeling fine and tolerating her immunotherapy with Nivolumab fairly well. She reports that the meeting with pain specialist did not go well. She states that her primary care physician is getting her an appointment with a pain specialist in [place]. She reports breathing better and is not currently using her oxygen. She complains of not being able to sleep.She denied having any significant chest pain but continues to have shortness of breath with exertion with no hemoptysis. She denied having  any significant weight loss or night sweats.   MEDICAL HISTORY: Past Medical History  Diagnosis Date  . H/O: stroke 2012, 2000,     X3  . Hypertension   . Anxiety   . PTSD (post-traumatic stress disorder)   . Depression   . SVD (spontaneous vaginal delivery)     x 1  . Smoker   . Osteoarthritis     knees, hips hands,   . Shortness of breath   . Pain     SEVERE PAIN BACK, RIGHT HIP AND KNEES--HARRINGTON RODS AND CERVICAL PLATES--USES WALKER OR CANE WHEN AMBULATING - GOES TO A PAIN CLINIC-STATES SHE NEEDS RT HIP AND BOTH KNEES REPLACED. PT STATES SHE WAS TOLD THE CEMENT AROUND THE HARRINGTON RODS IS CRACKED.  [name] Hot flashes, menopausal     SEVERE  . Complication of anesthesia 03/02/13    severe headache,vertigo postop  . COPD (chronic obstructive pulmonary disease)     stable  . 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 Lung cancer   . Status post radiation therapy within four to twelve weeks 10/18/13-12/06/13    lung ca  66Gy  . Family history of anesthesia complication     equipment failure caused problem, suffered a stroke & heartattack    ALLERGIES:  is allergic to carafate; prednisone; nsaids; aspirin; ibuprofen; and dulera.  MEDICATIONS:  Current Outpatient Prescriptions  Medication Sig Dispense Refill  . Aclidinium Bromide (TUDORZA PRESSAIR) 400 MCG/ACT AEPB Inhale 1 puff into the lungs 2 (two) times daily. 1 each 0  . albuterol (PROVENTIL) (2.5 MG/3ML) 0.083% nebulizer solution Take 2.5 mg by nebulization as needed for wheezing.    [name] albuterol (  VENTOLIN HFA) 108 (90 BASE) MCG/ACT inhaler Inhale 2 puffs into the lungs every 6 (six) hours as needed for wheezing or shortness of breath. 1 Inhaler 0  . ALPRAZolam (XANAX) 1 MG tablet Take 1 tablet (1 mg total) by mouth every 6 (six) hours as needed for anxiety. 30 tablet 0  . atenolol (TENORMIN) 50 MG tablet Take 1 tablet (50 mg total) by mouth daily. 30 tablet 0  . benzonatate (TESSALON) 200 MG capsule Take 1 capsule (200 mg total)  by mouth 3 (three) times daily as needed for cough. 20 capsule 0  . chlorzoxazone (PARAFON) 500 MG tablet Take 500 mg by mouth 4 (four) times daily as needed for muscle spasms.    [name] dextromethorphan-guaiFENesin (MUCINEX DM) 30-600 MG per 12 hr tablet Take 1 tablet by mouth every 12 (twelve) hours.    [name] esomeprazole (NEXIUM) 40 MG capsule Take 40 mg by mouth daily as needed.     [name] levothyroxine (SYNTHROID) 50 MCG tablet Take 1 tablet (50 mcg total) by mouth daily before breakfast. 30 tablet 1  . ondansetron (ZOFRAN ODT) 4 MG disintegrating tablet Take 1 tablet (4 mg total) by mouth every 8 (eight) hours as needed for nausea or vomiting. 20 tablet 0  . oxyCODONE-acetaminophen (PERCOCET) 7.5-325 MG per tablet Take 1 tablet by mouth every 6 (six) hours as needed for pain. 20 tablet 0  . promethazine (PHENERGAN) 25 MG tablet Take 25 mg by mouth every 6 (six) hours as needed for nausea or vomiting.    . sertraline (ZOLOFT) 100 MG tablet Take 200 mg by mouth daily.     . trazodone (DESYREL) 300 MG tablet Take 300 mg by mouth at bedtime.     No current facility-administered medications for this visit.    SURGICAL HISTORY:  Past Surgical History  Procedure Laterality Date  . Carpal tunnel release      BILATERAL  . Nasal sinus surgery    . Lumbar fusion      L4-S1  . Cervical fusion    . Hernia repair    . Salpingoophorectomy Right   . Vaginal hysterectomy      TAH w/ ovary removal  . Transthoracic echocardiogram  12-03-2011    LVSF NORMAL/ EF 60-65%  . Vulvectomy N/A 03/02/2013    Procedure: WIDE LOCAL EXCISION VULVAR;  Surgeon: Imagene Gurney A. Alycia Rossetti, MD;  Location: [facility];  Service: Gynecology;  Laterality: N/A;  . Co2 laser application N/A 3/81/0175    Procedure: LASER APPLICATION OF THE VULVA;  Surgeon: Janie Morning, MD;  Location: [facility];  Service: Gynecology;  Laterality: N/A;  . Video bronchoscopy Bilateral 09/23/2013    Procedure: VIDEO BRONCHOSCOPY WITHOUT FLUORO;   Surgeon: Tanda Rockers, MD;  Location: [facility];  Service: Cardiopulmonary;  Laterality: Bilateral;  . Hemiarthroplasty shoulder fracture Left 05/25/2014    DR CHANDLER  . Reverse shoulder arthroplasty Left 05/24/2014    Procedure:   Hemi-Arthroplasty  Left Shoulder;  Surgeon: Nita Sells, MD;  Location: [place];  Service: Orthopedics;  Laterality: Left;    REVIEW OF SYSTEMS:  Constitutional: positive for fatigue Eyes: negative Ears, nose, mouth, throat, and face: negative Respiratory: positive for dyspnea on exertion Cardiovascular: negative Gastrointestinal: negative Genitourinary:negative Integument/breast: negative Hematologic/lymphatic: negative Musculoskeletal:positive for muscle weakness Neurological: negative Behavioral/Psych: negative Endocrine: negative Allergic/Immunologic: negative   PHYSICAL EXAMINATION: General appearance: alert, cooperative, fatigued and no distress Head: Normocephalic, without obvious abnormality, atraumatic Neck: no adenopathy, no JVD, supple, symmetrical, trachea midline and  thyroid not enlarged, symmetric, no tenderness/mass/nodules Lymph nodes: Cervical, supraclavicular, and axillary nodes normal. Resp: wheezes bilaterally Back: symmetric, no curvature. ROM normal. No CVA tenderness. Cardio: regular rate and rhythm, S1, S2 normal, no murmur, click, rub or gallop GI: soft, non-tender; bowel sounds normal; no masses,  no organomegaly Extremities: extremities normal, atraumatic, no cyanosis or edema Neurologic: Alert and oriented X 3, normal strength and tone. Normal symmetric reflexes. Normal coordination and gait  ECOG PERFORMANCE STATUS: 2 - Symptomatic, <50% confined to bed  Blood pressure 132/63, pulse 73, temperature 98.3 F (36.8 C), temperature source Oral, resp. rate 18, height 5\' 6"  (1.676 m), weight 169 lb 6.4 oz (76.839 kg).  LABORATORY DATA: Lab Results  Component Value Date   WBC 5.1 12/09/2014   HGB 13.1  12/09/2014   HCT 40.7 12/09/2014   MCV 98.9 12/09/2014   PLT 174 12/09/2014      Chemistry      Component Value Date/Time   NA 134* 12/09/2014 1100   NA 136* 11/15/2014 1226   K 4.2 12/09/2014 1100   K 4.6 11/15/2014 1226   CL 99 11/15/2014 1226   CO2 27 12/09/2014 1100   CO2 28 11/15/2014 1226   BUN 8.8 12/09/2014 1100   BUN 6 11/15/2014 1226   CREATININE 0.9 12/09/2014 1100   CREATININE 0.74 11/15/2014 1226      Component Value Date/Time   CALCIUM 8.4 12/09/2014 1100   CALCIUM 8.3* 11/15/2014 1226   ALKPHOS 138 12/09/2014 1100   ALKPHOS 146* 04/20/2014 1108   AST 15 12/09/2014 1100   AST 15 04/20/2014 1108   ALT 12 12/09/2014 1100   ALT 7 04/20/2014 1108   BILITOT 0.52 12/09/2014 1100   BILITOT 0.2* 04/20/2014 1108       RADIOGRAPHIC STUDIES: Dg Chest 1 View  11/15/2014   CLINICAL DATA:  Shortness of breath, cough, history of lung cancer  EXAM: CHEST - 1 VIEW  COMPARISON:  07/28/2014  FINDINGS: Cardiomediastinal silhouette is stable. Right head that left shoulder prosthesis again noted. No acute infiltrate or pulmonary edema.  IMPRESSION: No active disease.   Electronically Signed   By: Lahoma Crocker M.D.   On: 11/15/2014 13:43   Dg Hip Complete Right  11/15/2014   CLINICAL DATA:  Right hip pain post fall 2 days ago at home  EXAM: RIGHT HIP - COMPLETE 2+ VIEW  COMPARISON:  01/04/2010  FINDINGS: Three views of the right hip submitted. No acute fracture or subluxation. There are degenerative changes with narrowing of superior joint space bilateral hip joint. Mild spurring bilateral femoral head. Mild sclerosis of right superior acetabulum. Atherosclerotic calcifications of femoral arteries.  IMPRESSION: No acute fracture or subluxation. Osteoarthritic changes as described above.   Electronically Signed   By: Lahoma Crocker M.D.   On: 11/15/2014 13:33   Ct Chest W Contrast  11/21/2014   CLINICAL DATA:  Stage IIIA non-small cell lung cancer. Liver cancer 2 years ago. Status post  chemotherapy and radiation therapy. Left anterior chest pain since 3 days ago. Hypertension and asthma.  EXAM: CT CHEST WITH CONTRAST  TECHNIQUE: Multidetector CT imaging of the chest was performed during intravenous contrast administration.  CONTRAST:  47mL OMNIPAQUE IOHEXOL 300 MG/ML  SOLN  COMPARISON:  Chest radiograph 11/15/2014. Most recent CT of 09/15/2014. Clinic note of 11/08/2014.  FINDINGS: Lungs/Pleura: Favor fluid within the bronchus intermedius dependently. Example image 29 of series 3. This is new or increased. Right lower lobe endobronchial compression or obstruction is similar.  Moderate centrilobular emphysema. Similar 3 mm right upper lobe lung nodule on image 22. Right infrahilar spiculated nodule surrounding and obstructing right lower lobe bronchi measures 1.8 x 1.8 cm on image 34. This is unchanged (when remeasured).  Patchy paramediastinal right upper and right lower lobe airspace disease is primarily similar. A right upper lobe 12 mm nodule with surrounding ground-glass opacity on the prior exam has resolved. Patchy new paramediastinal left upper lobe ground-glass and airspace opacities are mild. Example image 24 of series 3.  Right-sided pleural fluid is decreased, trace. There is trace left pleural fluid or thickening which is not significantly changed.  Heart/Mediastinum: Beam hardening artifact from left shoulder arthroplasty. Aortic and branch vessel atherosclerosis. Left subclavian artery atherosclerosis which is on the order of 40% on image fourteen. Mild cardiomegaly with trace anterior pericardial fluid or thickening, new. Multivessel coronary artery atherosclerosis. Middle mediastinal nodes. A 9 mm AP window node is similar to on the prior exam. Low right paratracheal node measures 9 mm and is also not significantly changed. No left hilar adenopathy.  Upper Abdomen: Normal imaged portions of the pancreas, spleen, stomach, gallbladder, adrenal glands. The lateral segment left liver  lobe 11 mm focus of hyper attenuation is less conspicuous today. 1.0 cm on image 49.  No new liver lesions are identified. Advanced abdominal aortic and branch vessel atherosclerosis.  Bones/Musculoskeletal: Moderate osteopenia. Lower cervical spine fixation. Removal lower left rib trauma.  IMPRESSION: 1. Right infrahilar/central right lower lobe nodule is unchanged. This causes similar narrowing/obstruction of right lower lobe bronchi. 2. paramediastinal opacities which are likely radiation induced. A right upper lobe nodular density has resolved since the prior exam and was likely inflammatory. Response of a metastatic lesion felt less likely. 3. Decreased  trace right-sided pleural fluid. 4. New trace pericardial effusion. 5. Decreased conspicuity of a the left hepatic lobe low-density focus. Indeterminate but may represent response to therapy of metastatic disease.   Electronically Signed   By: Abigail Miyamoto M.D.   On: 11/21/2014 16:54   Dg Foot Complete Left  11/15/2014   CLINICAL DATA:  Left foot pain redness and swelling, fall 2 days ago at home  EXAM: LEFT FOOT - COMPLETE 3+ VIEW  COMPARISON:  01/21/2006  FINDINGS: Three views of the left foot submitted. No acute fracture or subluxation. Mild hallux valgus deformity. Diffuse osteopenia. Question old fracture of distal fourth metatarsal.  IMPRESSION: No acute fracture or subluxation. Diffuse osteopenia. Mild hallux valgus deformity. Question old fracture of distal aspect fourth metatarsal.   Electronically Signed   By: Lahoma Crocker M.D.   On: 11/15/2014 13:35    ASSESSMENT AND PLAN: This is a very pleasant 68 years old white female with:  1) Stage IIIA non-small cell lung cancer completed a course of concurrent chemoradiation with weekly carboplatin and paclitaxel and tolerating her treatment fairly well. She completed 2 more cycles of systemic chemotherapy with carboplatin and paclitaxel every 3 weeks but her treatment was discontinued after the  patient had surgery for the left shoulder She had evidence for disease progression and the patient was started on treatment with immunotherapy with Nivolumab 3 MG/KG and tolerating her treatment fairly well. She is status post 4 cycles. The recent CT scan of the chest showed no significant evidence for disease progression. We will proceed with cycle #5 today as scheduled.   2) chronic back pain: The patient has this issues for over 30 years before her diagnosis with lung cancer. She has pain receiving her pain  medication by her primary care physician but she is not seeing him anymore. She reports theappointment with the pain clinic on 11/29/2014 did not go well. She reports that her primary care physician is making her an appointment with a pain specialist in [place], [place].  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 TSH is elevated today. I will start the patient on levothyroxine 50 g by mouth daily. We will continue to monitor her TSH closely.  4) primary care issues: She was recently fired by her primary care physician for noncompliance with her pain medication. Dr. Julien Nordmann gave the patient information of other primary care physician in [place] who may be accepting new patient to establish care with one of them.  5) smoke cessation: The patient is strongly encouraged to quit smoking and offered her to smoke cessation program.  6) Sleep disturbance/insomniaa: the patient may try Benadryl or melatonin as first steps  7) Follow up: will be in 2 weeks prior to the start of cycle #6 of Nivolumab.  She was advised to call immediately if she has any concerning symptoms in the interval. The patient voices understanding of current disease status and treatment options and is in agreement with the current care plan.  All questions were answered. The patient knows to call the clinic with any problems, questions or concerns. We can certainly see the patient much  sooner if necessary.  Carlton Adam, PA-C 12/09/2014    Disclaimer: This note was dictated with voice recognition software. Similar sounding words can inadvertently be transcribed and may not be corrected upon review.

## 2014-12-09 NOTE — Patient Instructions (Signed)
Nivolumab injection What is this medicine? NIVOLUMAB (nye VOL ue mab) is used to treat certain types of melanoma and lung cancer. This medicine may be used for other purposes; ask your health care provider or pharmacist if you have questions. COMMON BRAND NAME(S): Opdivo What should I tell my health care provider before I take this medicine? They need to know if you have any of these conditions: -eye disease, vision problems -history of pancreatitis -immune system problems -inflammatory bowel disease -kidney disease -liver disease -lung disease -lupus -myasthenia gravis -multiple sclerosis -organ transplant -stomach or intestine problems -thyroid disease -tingling of the fingers or toes, or other nerve disorder -an unusual or allergic reaction to nivolumab, other medicines, foods, dyes, or preservatives -pregnant or trying to get pregnant -breast-feeding How should I use this medicine? This medicine is for infusion into a vein. It is given by a health care professional in a hospital or clinic setting. A special MedGuide will be given to you before each treatment. Be sure to read this information carefully each time. Talk to your pediatrician regarding the use of this medicine in children. Special care may be needed. Overdosage: If you think you've taken too much of this medicine contact a poison control center or emergency room at once. Overdosage: If you think you have taken too much of this medicine contact a poison control center or emergency room at once. NOTE: This medicine is only for you. Do not share this medicine with others. What if I miss a dose? It is important not to miss your dose. Call your doctor or health care professional if you are unable to keep an appointment. What may interact with this medicine? Interactions have not been studied. This list may not describe all possible interactions. Give your health care provider a list of all the medicines, herbs,  non-prescription drugs, or dietary supplements you use. Also tell them if you smoke, drink alcohol, or use illegal drugs. Some items may interact with your medicine. What should I watch for while using this medicine? Tell your doctor or healthcare professional if your symptoms do not start to get better or if they get worse. Your condition will be monitored carefully while you are receiving this medicine. You may need blood work done while you are taking this medicine. What side effects may I notice from receiving this medicine? Side effects that you should report to your doctor or health care professional as soon as possible: -allergic reactions like skin rash, itching or hives, swelling of the face, lips, or tongue -black, tarry stools -bloody or watery diarrhea -changes in vision -chills -cough -depressed mood -eye pain -feeling anxious -fever -general ill feeling or flu-like symptoms -hair loss -loss of appetite -low blood counts - this medicine may decrease the number of white blood cells, red blood cells and platelets. You may be at increased risk for infections and bleeding -pain, tingling, numbness in the hands or feet -redness, blistering, peeling or loosening of the skin, including inside the mouth -red pinpoint spots on skin -signs of decreased platelets or bleeding - bruising, pinpoint red spots on the skin, black, tarry stools, blood in the urine -signs of decreased red blood cells - unusually weak or tired, feeling faint or lightheaded, falls -signs of infection - fever or chills, cough, sore throat, pain or trouble passing urine -signs and symptoms of a dangerous change in heartbeat or heart rhythm like chest pain; dizziness; fast or irregular heartbeat; palpitations; feeling faint or lightheaded, falls; breathing problems -signs  and symptoms of high blood sugar such as dizziness; dry mouth; dry skin; fruity breath; nausea; stomach pain; increased hunger or thirst; increased  urination -signs and symptoms of kidney injury like trouble passing urine or change in the amount of urine -signs and symptoms of liver injury like dark yellow or brown urine; general ill feeling or flu-like symptoms; light-colored stools; loss of appetite; nausea; right upper belly pain; unusually weak or tired; yellowing of the eyes or skin -signs and symptoms of increased potassium like muscle weakness; chest pain; or fast, irregular heartbeat -signs and symptoms of low potassium like muscle cramps or muscle pain; chest pain; dizziness; feeling faint or lightheaded, falls; palpitations; breathing problems; or fast, irregular heartbeat -swelling of the ankles, feet, hands -weight gainSide effects that usually do not require medical attention (report to your doctor or health care professional if they continue or are bothersome): -constipation -general ill feeling or flu-like symptoms -hair loss -loss of appetite -nausea, vomiting This list may not describe all possible side effects. Call your doctor for medical advice about side effects. You may report side effects to FDA at 1-800-FDA-1088. Where should I keep my medicine? This drug is given in a hospital or clinic and will not be stored at home. NOTE: This sheet is a summary. It may not cover all possible information. If you have questions about this medicine, talk to your doctor, pharmacist, or health care provider.  2015, [employer]vier/Gold Standard. (2014-03-07 13:18:19)

## 2014-12-10 ENCOUNTER — Emergency Department (HOSPITAL_COMMUNITY): Payer: Medicare Other

## 2014-12-10 ENCOUNTER — Inpatient Hospital Stay (HOSPITAL_COMMUNITY)
Admission: EM | Admit: 2014-12-10 | Discharge: 2014-12-15 | DRG: 492 | Disposition: A | Discharge status: Skilled Nursing Facility | Payer: Medicare Other | Attending: Internal Medicine | Admitting: Internal Medicine

## 2014-12-10 ENCOUNTER — Encounter (HOSPITAL_COMMUNITY): Payer: Self-pay | Admitting: *Deleted

## 2014-12-10 DIAGNOSIS — I69398 Other sequelae of cerebral infarction: Secondary | ICD-10-CM

## 2014-12-10 DIAGNOSIS — D62 Acute posthemorrhagic anemia: Secondary | ICD-10-CM

## 2014-12-10 DIAGNOSIS — F419 Anxiety disorder, unspecified: Secondary | ICD-10-CM | POA: Diagnosis present

## 2014-12-10 DIAGNOSIS — Z85118 Personal history of other malignant neoplasm of bronchus and lung: Secondary | ICD-10-CM

## 2014-12-10 DIAGNOSIS — Z923 Personal history of irradiation: Secondary | ICD-10-CM

## 2014-12-10 DIAGNOSIS — R509 Fever, unspecified: Secondary | ICD-10-CM

## 2014-12-10 DIAGNOSIS — W1830XA Fall on same level, unspecified, initial encounter: Secondary | ICD-10-CM | POA: Diagnosis present

## 2014-12-10 DIAGNOSIS — Y92009 Unspecified place in unspecified non-institutional (private) residence as the place of occurrence of the external cause: Secondary | ICD-10-CM

## 2014-12-10 DIAGNOSIS — F4310 Post-traumatic stress disorder, unspecified: Secondary | ICD-10-CM | POA: Diagnosis present

## 2014-12-10 DIAGNOSIS — Z809 Family history of malignant neoplasm, unspecified: Secondary | ICD-10-CM

## 2014-12-10 DIAGNOSIS — S42209A Unspecified fracture of upper end of unspecified humerus, initial encounter for closed fracture: Secondary | ICD-10-CM | POA: Diagnosis present

## 2014-12-10 DIAGNOSIS — J189 Pneumonia, unspecified organism: Secondary | ICD-10-CM | POA: Diagnosis not present

## 2014-12-10 DIAGNOSIS — M25561 Pain in right knee: Secondary | ICD-10-CM | POA: Diagnosis present

## 2014-12-10 DIAGNOSIS — S42309A Unspecified fracture of shaft of humerus, unspecified arm, initial encounter for closed fracture: Secondary | ICD-10-CM | POA: Diagnosis not present

## 2014-12-10 DIAGNOSIS — W010XXA Fall on same level from slipping, tripping and stumbling without subsequent striking against object, initial encounter: Secondary | ICD-10-CM | POA: Diagnosis present

## 2014-12-10 DIAGNOSIS — F329 Major depressive disorder, single episode, unspecified: Secondary | ICD-10-CM | POA: Diagnosis present

## 2014-12-10 DIAGNOSIS — F17210 Nicotine dependence, cigarettes, uncomplicated: Secondary | ICD-10-CM | POA: Diagnosis present

## 2014-12-10 DIAGNOSIS — S43084A Other dislocation of right shoulder joint, initial encounter: Secondary | ICD-10-CM

## 2014-12-10 DIAGNOSIS — Z8249 Family history of ischemic heart disease and other diseases of the circulatory system: Secondary | ICD-10-CM

## 2014-12-10 DIAGNOSIS — S42231A 3-part fracture of surgical neck of right humerus, initial encounter for closed fracture: Principal | ICD-10-CM | POA: Diagnosis present

## 2014-12-10 DIAGNOSIS — J449 Chronic obstructive pulmonary disease, unspecified: Secondary | ICD-10-CM | POA: Diagnosis present

## 2014-12-10 DIAGNOSIS — G8929 Other chronic pain: Secondary | ICD-10-CM | POA: Diagnosis present

## 2014-12-10 DIAGNOSIS — Z419 Encounter for procedure for purposes other than remedying health state, unspecified: Secondary | ICD-10-CM

## 2014-12-10 DIAGNOSIS — G252 Other specified forms of tremor: Secondary | ICD-10-CM | POA: Diagnosis present

## 2014-12-10 DIAGNOSIS — C3490 Malignant neoplasm of unspecified part of unspecified bronchus or lung: Secondary | ICD-10-CM | POA: Diagnosis present

## 2014-12-10 DIAGNOSIS — Z833 Family history of diabetes mellitus: Secondary | ICD-10-CM

## 2014-12-10 DIAGNOSIS — I10 Essential (primary) hypertension: Secondary | ICD-10-CM | POA: Diagnosis present

## 2014-12-10 DIAGNOSIS — W19XXXA Unspecified fall, initial encounter: Secondary | ICD-10-CM

## 2014-12-10 DIAGNOSIS — M1990 Unspecified osteoarthritis, unspecified site: Secondary | ICD-10-CM | POA: Diagnosis present

## 2014-12-10 MED ORDER — HYDROMORPHONE HCL 2 MG/ML IJ SOLN
2.0000 mg | Freq: Once | INTRAMUSCULAR | Status: AC
  Administered 2014-12-10: 2 mg via INTRAVENOUS
  Filled 2014-12-10: qty 1

## 2014-12-10 MED ORDER — FENTANYL CITRATE 0.05 MG/ML IJ SOLN
50.0000 ug | Freq: Once | INTRAMUSCULAR | Status: AC
  Administered 2014-12-10: 50 ug via INTRAVENOUS
  Filled 2014-12-10: qty 2

## 2014-12-10 NOTE — ED Provider Notes (Signed)
CSN: [id number]     Arrival date & time 12/10/14  2142 History  This chart was scribed for Veryl Speak, MD by Jeanell Sparrow, ED Scribe. This patient was seen in room APA02/APA02 and the patient's care was started at 11:01 PM.    Chief Complaint  Patient presents with  . Fall   Patient is a 68 y.o. female presenting with fall. The history is provided by the patient. No language interpreter was used.  Fall This is a new problem. The current episode started 3 to 5 hours ago. The problem occurs rarely. The problem has not changed since onset.The symptoms are aggravated by bending. Nothing relieves the symptoms. She has tried nothing for the symptoms.   HPI Comments: [name] is a 68 y.o. female who presents to the Emergency Department complaining of a fall that occurred today. She states that she tripped and fell at home and landed with her arm stretched out. She denies any head injury or LOC. She states that she has constant moderate right shoulder pain that is exacerbated by movement, She reports that she had chemo yesterday for her lung cancer dx in Jan 2014. She states that she is currently on 7.5 mg percocet. She reports that she is usually ambulatory with a walker.   Past Medical History  Diagnosis Date  . H/O: stroke 2012, 2000,     X3  . Hypertension   . Anxiety   . PTSD (post-traumatic stress disorder)   . Depression   . SVD (spontaneous vaginal delivery)     x 1  . Smoker   . Osteoarthritis     knees, hips hands,   . Shortness of breath   . Pain     SEVERE PAIN BACK, RIGHT HIP AND KNEES--HARRINGTON RODS AND CERVICAL PLATES--USES WALKER OR CANE WHEN AMBULATING - GOES TO A PAIN CLINIC-STATES SHE NEEDS RT HIP AND BOTH KNEES REPLACED. PT STATES SHE WAS TOLD THE CEMENT AROUND THE HARRINGTON RODS IS CRACKED.  [name] Hot flashes, menopausal     SEVERE  . Complication of anesthesia 03/02/13    severe headache,vertigo postop  . COPD (chronic obstructive pulmonary disease)     stable   . 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 Lung cancer   . Status post radiation therapy within four to twelve weeks 10/18/13-12/06/13    lung ca  67Gy  . Family history of anesthesia complication     equipment failure caused problem, suffered a stroke & heartattack   Past Surgical History  Procedure Laterality Date  . Carpal tunnel release      BILATERAL  . Nasal sinus surgery    . Lumbar fusion      L4-S1  . Cervical fusion    . Hernia repair    . Salpingoophorectomy Right   . Vaginal hysterectomy      TAH w/ ovary removal  . Transthoracic echocardiogram  12-03-2011    LVSF NORMAL/ EF 60-65%  . Vulvectomy N/A 03/02/2013    Procedure: WIDE LOCAL EXCISION VULVAR;  Surgeon: Imagene Gurney A. Alycia Rossetti, MD;  Location: [facility];  Service: Gynecology;  Laterality: N/A;  . Co2 laser application N/A 01/17/4173    Procedure: LASER APPLICATION OF THE VULVA;  Surgeon: Janie Morning, MD;  Location: [facility];  Service: Gynecology;  Laterality: N/A;  . Video bronchoscopy Bilateral 09/23/2013    Procedure: VIDEO BRONCHOSCOPY WITHOUT FLUORO;  Surgeon: Tanda Rockers, MD;  Location: [facility];  Service: Cardiopulmonary;  Laterality:  Bilateral;  . Hemiarthroplasty shoulder fracture Left 05/25/2014    DR CHANDLER  . Reverse shoulder arthroplasty Left 05/24/2014    Procedure:   Hemi-Arthroplasty  Left Shoulder;  Surgeon: Nita Sells, MD;  Location: [facility];  Service: Orthopedics;  Laterality: Left;   Family History  Problem Relation Age of Onset  . Cancer Mother     COLON  . Heart disease Father   . Diabetes Maternal Aunt   . Cancer Maternal Grandfather     KIDNEY   . Diabetes Maternal Grandfather   . Hypertension Maternal Grandfather   . Heart disease Maternal Grandfather    History  Substance Use Topics  . Smoking status: Light Tobacco Smoker -- 0.50 packs/day for 35 years    Types: Cigarettes  . Smokeless tobacco: Never Used     Comment: 4cigs per day 02/17/13  . Alcohol  Use: No   OB History    Gravida Para Term Preterm AB TAB SAB Ectopic Multiple Living   2 1   1     1      Review of Systems  Musculoskeletal: Positive for myalgias.  Neurological: Negative for syncope.  All other systems reviewed and are negative.    Allergies  Carafate; Prednisone; Nsaids; Aspirin; Ibuprofen; and Dulera  Home Medications   Prior to Admission medications   Medication Sig Start Date End Date Taking? Authorizing Provider  Aclidinium Bromide (TUDORZA PRESSAIR) 400 MCG/ACT AEPB Inhale 1 puff into the lungs 2 (two) times daily. 11/10/14  Yes Curt Bears, MD  albuterol (PROVENTIL) (2.5 MG/3ML) 0.083% nebulizer solution Take 2.5 mg by nebulization as needed for wheezing.   Yes Historical Provider, MD  albuterol (VENTOLIN HFA) 108 (90 BASE) MCG/ACT inhaler Inhale 2 puffs into the lungs every 6 (six) hours as needed for wheezing or shortness of breath. 11/10/14  Yes Curt Bears, MD  ALPRAZolam Duanne Moron) 1 MG tablet Take 1 tablet (1 mg total) by mouth every 6 (six) hours as needed for anxiety. 01/01/14  Yes Kinnie Feil, MD  atenolol (TENORMIN) 50 MG tablet Take 1 tablet (50 mg total) by mouth daily. 11/25/13  Yes Velvet Bathe, MD  benzonatate (TESSALON) 200 MG capsule Take 1 capsule (200 mg total) by mouth 3 (three) times daily as needed for cough. 11/15/14  Yes Curt Bears, MD  chlorzoxazone (PARAFON) 500 MG tablet Take 500 mg by mouth 4 (four) times daily as needed for muscle spasms.   Yes Historical Provider, MD  dextromethorphan-guaiFENesin (MUCINEX DM) 30-600 MG per 12 hr tablet Take 1 tablet by mouth every 12 (twelve) hours.   Yes Historical Provider, MD  esomeprazole (NEXIUM) 40 MG capsule Take 40 mg by mouth daily as needed (for acid reflux).    Yes Historical Provider, MD  levothyroxine (SYNTHROID) 50 MCG tablet Take 1 tablet (50 mcg total) by mouth daily before breakfast. 11/23/14  Yes Curt Bears, MD  ondansetron (ZOFRAN ODT) 4 MG disintegrating tablet  Take 1 tablet (4 mg total) by mouth every 8 (eight) hours as needed for nausea or vomiting. 04/14/14  Yes Curt Bears, MD  oxyCODONE-acetaminophen (PERCOCET) 7.5-325 MG per tablet Take 1 tablet by mouth every 6 (six) hours as needed for pain. 11/23/14  Yes Curt Bears, MD  promethazine (PHENERGAN) 25 MG tablet Take 25 mg by mouth every 6 (six) hours as needed for nausea or vomiting.   Yes Historical Provider, MD  sertraline (ZOLOFT) 100 MG tablet Take 200 mg by mouth daily.    Yes Historical Provider, MD  trazodone (DESYREL) 300 MG tablet Take 300 mg by mouth at bedtime.   Yes Historical Provider, MD   BP 191/96 mmHg  Pulse 75  Temp(Src) 98.1 F (36.7 C) (Oral)  Resp 24  Ht 5\' 6"  (1.676 m)  Wt 164 lb (74.39 kg)  BMI 26.48 kg/m2  SpO2 95% Physical Exam  Constitutional: She is oriented to person, place, and time. She appears well-developed and well-nourished. No distress.  HENT:  Head: Normocephalic and atraumatic.  Neck: Neck supple. No tracheal deviation present.  Cardiovascular: Normal rate, regular rhythm and normal heart sounds.  Exam reveals no gallop and no friction rub.   No murmur heard. Pulmonary/Chest: Effort normal. No respiratory distress. She has no wheezes. She has no rales.  Abdominal: Soft. She exhibits no distension. There is no tenderness. There is no rebound and no guarding.  Musculoskeletal: Normal range of motion.  Obvious deformity of the right shoulder. Ulnar and radial pulses are palpable. Motor and sensory are intact to the forearm and hand.   Neurological: She is alert and oriented to person, place, and time.  Skin: Skin is warm and dry.  Psychiatric: She has a normal mood and affect. Her behavior is normal.  Nursing note and vitals reviewed.   ED Course  Procedures (including critical care time) DIAGNOSTIC STUDIES: Oxygen Saturation is 95% on RA, normal by my interpretation.    COORDINATION OF CARE: 11:05 PM- Pt advised of plan for treatment  which includes medication and radiology and pt agrees.  Labs Review Labs Reviewed - No data to display  Imaging Review Dg Chest 1 View  12/10/2014   CLINICAL DATA:  Fall today on to hard surface, shoulder pain, history of metastatic lung cancer.  EXAM: CHEST - 1 VIEW  COMPARISON:  Chest radiograph November 15, 2014  FINDINGS: Limited assessment as patient is rotated to the RIGHT, RIGHT breast attenuation. Cardiomediastinal silhouette grossly normal though, rotation accentuates the RIGHT hilum. Calcified aortic knob. No pleural effusions or focal consolidation. No pneumothorax.  Status post ACDF. LEFT humeral arthroplasty. Comminuted RIGHT humeral fracture dislocation better seen on radiograph from same day, reported separately.  IMPRESSION: No definite acute cardiopulmonary process with a aforementioned limitations.   Electronically Signed   By: Elon Alas   On: 12/10/2014 22:58   Dg Shoulder Right  12/10/2014   CLINICAL DATA:  Fall, landed on a hard surface, shoulder pain. History of metastatic lung cancer.  EXAM: RIGHT SHOULDER - 2+ VIEW  COMPARISON:  None.  FINDINGS: Comminuted RIGHT humerus surgical neck fracture with medial deviation distal body fragments, impaction. At least 4 fracture fragments identified. Widened subacromial joint space. 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 No destructive bony lesions. Soft tissue swelling without subcutaneous gas or radiopaque foreign bodies.  IMPRESSION: RIGHT humerus surgical neck fracture dislocation appears posttraumatic.   Electronically Signed   By: Elon Alas   On: 12/10/2014 22:56     EKG Interpretation   Date/Time:  Sunday December 11 2014 00:05:14 EST Ventricular Rate:  75 PR Interval:  159 QRS Duration: 93 QT Interval:  410 QTC Calculation: 458 R Axis:   60 Text Interpretation:  Sinus rhythm Baseline wander in lead(s) III aVL  Unchanged from 12/31/13 Confirmed by DELOS  MD, Shameek Nyquist ([id number]) on   12/11/2014 12:34:12 AM      MDM   Final diagnoses:  Fall    Patient is a 68 year old female with extensive past medical history. She presents today after a fall. She apparently tripped forward over  her walker and injured her right shoulder.  X-rays reveal a comminuted proximal humerus fracture with glenohumeral dislocation. I've discussed these results with Dr. Rolena Infante from orthopedics who is recommending a CT scan. This was performed and confirms the above diagnosis. His recommendations were to admit to internal medicine due to her extensive past medical history and he will consult on the patient. As there is no orthopedic coverage this weekend, the patient will be admitted to [facility]. I've discussed the case with Dr. Shanon Brow who will assist with the transfer.  I personally performed the services described in this documentation, which was scribed in my presence. The recorded information has been reviewed and is accurate.       Veryl Speak, MD 12/11/14 [phone]

## 2014-12-10 NOTE — ED Notes (Signed)
Pt arrived from home by EMS. Pt fell landing on her right shoulder, pt denies hitting her head or any LOC.

## 2014-12-11 ENCOUNTER — Other Ambulatory Visit: Payer: Self-pay

## 2014-12-11 DIAGNOSIS — Y92009 Unspecified place in unspecified non-institutional (private) residence as the place of occurrence of the external cause: Secondary | ICD-10-CM

## 2014-12-11 DIAGNOSIS — I69398 Other sequelae of cerebral infarction: Secondary | ICD-10-CM | POA: Diagnosis not present

## 2014-12-11 DIAGNOSIS — G8929 Other chronic pain: Secondary | ICD-10-CM

## 2014-12-11 DIAGNOSIS — J189 Pneumonia, unspecified organism: Secondary | ICD-10-CM | POA: Diagnosis not present

## 2014-12-11 DIAGNOSIS — F419 Anxiety disorder, unspecified: Secondary | ICD-10-CM | POA: Diagnosis not present

## 2014-12-11 DIAGNOSIS — M1990 Unspecified osteoarthritis, unspecified site: Secondary | ICD-10-CM | POA: Diagnosis not present

## 2014-12-11 DIAGNOSIS — F17210 Nicotine dependence, cigarettes, uncomplicated: Secondary | ICD-10-CM | POA: Diagnosis not present

## 2014-12-11 DIAGNOSIS — Z809 Family history of malignant neoplasm, unspecified: Secondary | ICD-10-CM | POA: Diagnosis not present

## 2014-12-11 DIAGNOSIS — W010XXA Fall on same level from slipping, tripping and stumbling without subsequent striking against object, initial encounter: Secondary | ICD-10-CM | POA: Diagnosis not present

## 2014-12-11 DIAGNOSIS — I10 Essential (primary) hypertension: Secondary | ICD-10-CM

## 2014-12-11 DIAGNOSIS — J449 Chronic obstructive pulmonary disease, unspecified: Secondary | ICD-10-CM

## 2014-12-11 DIAGNOSIS — S42301S Unspecified fracture of shaft of humerus, right arm, sequela: Secondary | ICD-10-CM

## 2014-12-11 DIAGNOSIS — G252 Other specified forms of tremor: Secondary | ICD-10-CM | POA: Diagnosis not present

## 2014-12-11 DIAGNOSIS — F329 Major depressive disorder, single episode, unspecified: Secondary | ICD-10-CM | POA: Diagnosis not present

## 2014-12-11 DIAGNOSIS — W1830XA Fall on same level, unspecified, initial encounter: Secondary | ICD-10-CM | POA: Diagnosis not present

## 2014-12-11 DIAGNOSIS — D62 Acute posthemorrhagic anemia: Secondary | ICD-10-CM | POA: Diagnosis not present

## 2014-12-11 DIAGNOSIS — S42309S Unspecified fracture of shaft of humerus, unspecified arm, sequela: Secondary | ICD-10-CM

## 2014-12-11 DIAGNOSIS — F4310 Post-traumatic stress disorder, unspecified: Secondary | ICD-10-CM | POA: Diagnosis not present

## 2014-12-11 DIAGNOSIS — S42309A Unspecified fracture of shaft of humerus, unspecified arm, initial encounter for closed fracture: Secondary | ICD-10-CM | POA: Diagnosis present

## 2014-12-11 DIAGNOSIS — S42231A 3-part fracture of surgical neck of right humerus, initial encounter for closed fracture: Secondary | ICD-10-CM | POA: Diagnosis not present

## 2014-12-11 DIAGNOSIS — Z833 Family history of diabetes mellitus: Secondary | ICD-10-CM | POA: Diagnosis not present

## 2014-12-11 DIAGNOSIS — Z8249 Family history of ischemic heart disease and other diseases of the circulatory system: Secondary | ICD-10-CM | POA: Diagnosis not present

## 2014-12-11 DIAGNOSIS — Z85118 Personal history of other malignant neoplasm of bronchus and lung: Secondary | ICD-10-CM | POA: Diagnosis not present

## 2014-12-11 DIAGNOSIS — W19XXXA Unspecified fall, initial encounter: Secondary | ICD-10-CM

## 2014-12-11 DIAGNOSIS — W19XXXS Unspecified fall, sequela: Secondary | ICD-10-CM

## 2014-12-11 DIAGNOSIS — M25561 Pain in right knee: Secondary | ICD-10-CM | POA: Diagnosis not present

## 2014-12-11 DIAGNOSIS — Z923 Personal history of irradiation: Secondary | ICD-10-CM | POA: Diagnosis not present

## 2014-12-11 LAB — COMPREHENSIVE METABOLIC PANEL
ALT: 10 U/L (ref 0–35)
AST: 14 U/L (ref 0–37)
Albumin: 3.6 g/dL (ref 3.5–5.2)
Alkaline Phosphatase: 131 U/L — ABNORMAL HIGH (ref 39–117)
Anion gap: 10 (ref 5–15)
BUN: 7 mg/dL (ref 6–23)
CALCIUM: 8.6 mg/dL (ref 8.4–10.5)
CO2: 28 mEq/L (ref 19–32)
Chloride: 94 mEq/L — ABNORMAL LOW (ref 96–112)
Creatinine, Ser: 0.71 mg/dL (ref 0.50–1.10)
GFR calc Af Amer: >90 mL/min (ref >90)
GFR calc non Af Amer: 87 mL/min — ABNORMAL LOW (ref >90)
Glucose, Bld: 276 mg/dL — ABNORMAL HIGH (ref 70–99)
Potassium: 4.2 mEq/L (ref 3.7–5.3)
Sodium: 132 mEq/L — ABNORMAL LOW (ref 137–147)
Total Bilirubin: <0.2 mg/dL — ABNORMAL LOW (ref 0.3–1.2)
Total Protein: 7.2 g/dL (ref 6.0–8.3)

## 2014-12-11 LAB — CBC WITH DIFFERENTIAL/PLATELET
Basophils Absolute: 0 10*3/uL (ref 0.0–0.1)
Basophils Relative: 0 % (ref 0–1)
Eosinophils Absolute: 0.1 10*3/uL (ref 0.0–0.7)
Eosinophils Relative: 1 % (ref 0–5)
HCT: 35.3 % — ABNORMAL LOW (ref 36.0–46.0)
Hemoglobin: 11.9 g/dL — ABNORMAL LOW (ref 12.0–15.0)
LYMPHS ABS: 0.9 10*3/uL (ref 0.7–4.0)
LYMPHS PCT: 10 % — AB (ref 12–46)
MCH: 32.6 pg (ref 26.0–34.0)
MCHC: 33.7 g/dL (ref 30.0–36.0)
MCV: 96.7 fL (ref 78.0–100.0)
Monocytes Absolute: 0.5 10*3/uL (ref 0.1–1.0)
Monocytes Relative: 6 % (ref 3–12)
Neutro Abs: 7.5 10*3/uL (ref 1.7–7.7)
Neutrophils Relative %: 83 % — ABNORMAL HIGH (ref 43–77)
PLATELETS: 158 10*3/uL (ref 150–400)
RBC: 3.65 MIL/uL — AB (ref 3.87–5.11)
RDW: 14.5 % (ref 11.5–15.5)
WBC: 9 10*3/uL (ref 4.0–10.5)

## 2014-12-11 LAB — BASIC METABOLIC PANEL
Anion gap: 11 (ref 5–15)
BUN: 6 mg/dL (ref 6–23)
CO2: 25 mEq/L (ref 19–32)
Calcium: 8.4 mg/dL (ref 8.4–10.5)
Chloride: 94 mEq/L — ABNORMAL LOW (ref 96–112)
Creatinine, Ser: 0.51 mg/dL (ref 0.50–1.10)
GFR calc Af Amer: >90 mL/min (ref >90)
GLUCOSE: 268 mg/dL — AB (ref 70–99)
POTASSIUM: 4.4 meq/L (ref 3.7–5.3)
Sodium: 130 mEq/L — ABNORMAL LOW (ref 137–147)

## 2014-12-11 LAB — CBC
HCT: 32.5 % — ABNORMAL LOW (ref 36.0–46.0)
Hemoglobin: 10.7 g/dL — ABNORMAL LOW (ref 12.0–15.0)
MCH: 31.5 pg (ref 26.0–34.0)
MCHC: 32.9 g/dL (ref 30.0–36.0)
MCV: 95.6 fL (ref 78.0–100.0)
PLATELETS: 144 10*3/uL — AB (ref 150–400)
RBC: 3.4 MIL/uL — AB (ref 3.87–5.11)
RDW: 14.4 % (ref 11.5–15.5)
WBC: 6.5 10*3/uL (ref 4.0–10.5)

## 2014-12-11 LAB — PROTIME-INR
INR: 0.94 (ref 0.00–1.49)
PROTHROMBIN TIME: 12.6 s (ref 11.6–15.2)

## 2014-12-11 LAB — MRSA PCR SCREENING: MRSA BY PCR: NEGATIVE

## 2014-12-11 MED ORDER — HYDROMORPHONE HCL 1 MG/ML IJ SOLN
2.0000 mg | INTRAMUSCULAR | Status: DC | PRN
  Administered 2014-12-11 – 2014-12-13 (×10): 2 mg via INTRAVENOUS
  Filled 2014-12-11 (×10): qty 2

## 2014-12-11 MED ORDER — LACTATED RINGERS IV SOLN
INTRAVENOUS | Status: DC
  Administered 2014-12-11 – 2014-12-12 (×3): via INTRAVENOUS

## 2014-12-11 MED ORDER — CEFAZOLIN SODIUM-DEXTROSE 2-3 GM-% IV SOLR
2.0000 g | INTRAVENOUS | Status: AC
  Administered 2014-12-12: 2 g via INTRAVENOUS
  Filled 2014-12-11: qty 50

## 2014-12-11 MED ORDER — OXYCODONE HCL 5 MG PO TABS
5.0000 mg | ORAL_TABLET | ORAL | Status: DC | PRN
  Administered 2014-12-11: 10 mg via ORAL
  Administered 2014-12-11: 5 mg via ORAL
  Administered 2014-12-12 – 2014-12-13 (×6): 10 mg via ORAL
  Administered 2014-12-13: 5 mg via ORAL
  Administered 2014-12-13 – 2014-12-15 (×6): 10 mg via ORAL
  Filled 2014-12-11 (×2): qty 1
  Filled 2014-12-11 (×13): qty 2

## 2014-12-11 MED ORDER — ALPRAZOLAM 0.5 MG PO TABS
1.0000 mg | ORAL_TABLET | Freq: Three times a day (TID) | ORAL | Status: DC | PRN
  Administered 2014-12-11 – 2014-12-15 (×13): 1 mg via ORAL
  Filled 2014-12-11 (×13): qty 2

## 2014-12-11 MED ORDER — SODIUM CHLORIDE 0.9 % IV SOLN
INTRAVENOUS | Status: DC
  Administered 2014-12-11: 06:00:00 via INTRAVENOUS

## 2014-12-11 MED ORDER — ONDANSETRON HCL 4 MG/2ML IJ SOLN: 4.0000 mg | Freq: Three times a day (TID) | INTRAMUSCULAR | Status: DC | PRN

## 2014-12-11 MED ORDER — HYDROMORPHONE HCL 1 MG/ML IJ SOLN: 1.0000 mg | INTRAMUSCULAR | Status: DC | PRN

## 2014-12-11 MED ORDER — ONDANSETRON HCL 4 MG PO TABS
4.0000 mg | ORAL_TABLET | Freq: Four times a day (QID) | ORAL | Status: DC | PRN
  Administered 2014-12-14 – 2014-12-15 (×3): 4 mg via ORAL
  Filled 2014-12-11 (×4): qty 1

## 2014-12-11 MED ORDER — CHLORZOXAZONE 500 MG PO TABS
500.0000 mg | ORAL_TABLET | Freq: Four times a day (QID) | ORAL | Status: DC | PRN
  Administered 2014-12-11 – 2014-12-15 (×8): 500 mg via ORAL
  Filled 2014-12-11 (×11): qty 1

## 2014-12-11 MED ORDER — OXYCODONE HCL 5 MG PO TABS
5.0000 mg | ORAL_TABLET | ORAL | Status: DC | PRN
  Administered 2014-12-11 (×3): 5 mg via ORAL
  Filled 2014-12-11 (×3): qty 1

## 2014-12-11 MED ORDER — HYDROMORPHONE HCL 1 MG/ML IJ SOLN
1.0000 mg | Freq: Once | INTRAMUSCULAR | Status: AC
  Administered 2014-12-11: 1 mg via INTRAVENOUS
  Filled 2014-12-11: qty 1

## 2014-12-11 MED ORDER — SODIUM CHLORIDE 0.9 % IV SOLN: INTRAVENOUS | Status: DC

## 2014-12-11 MED ORDER — ATENOLOL 50 MG PO TABS
50.0000 mg | ORAL_TABLET | Freq: Every day | ORAL | Status: DC
  Filled 2014-12-11: qty 1

## 2014-12-11 MED ORDER — ATENOLOL 50 MG PO TABS
50.0000 mg | ORAL_TABLET | Freq: Every day | ORAL | Status: DC
  Administered 2014-12-11 – 2014-12-15 (×5): 50 mg via ORAL
  Filled 2014-12-11 (×5): qty 1

## 2014-12-11 MED ORDER — ALPRAZOLAM 0.5 MG PO TABS
1.0000 mg | ORAL_TABLET | Freq: Once | ORAL | Status: AC
  Administered 2014-12-11: 1 mg via ORAL
  Filled 2014-12-11: qty 2

## 2014-12-11 MED ORDER — SERTRALINE HCL 100 MG PO TABS
100.0000 mg | ORAL_TABLET | Freq: Every day | ORAL | Status: DC
  Administered 2014-12-11 – 2014-12-13 (×2): 100 mg via ORAL
  Filled 2014-12-11 (×3): qty 1

## 2014-12-11 MED ORDER — ALPRAZOLAM 0.5 MG PO TABS
0.5000 mg | ORAL_TABLET | Freq: Three times a day (TID) | ORAL | Status: DC | PRN
Start: 2014-12-11 — End: 2014-12-11
  Administered 2014-12-11: 0.5 mg via ORAL
  Filled 2014-12-11: qty 1

## 2014-12-11 MED ORDER — HYDROMORPHONE HCL 1 MG/ML IJ SOLN
1.0000 mg | Freq: Once | INTRAMUSCULAR | Status: AC
Start: 2014-12-11 — End: 2014-12-11
  Administered 2014-12-11: 1 mg via INTRAVENOUS
  Filled 2014-12-11: qty 1

## 2014-12-11 MED ORDER — CHLORHEXIDINE GLUCONATE 4 % EX LIQD
60.0000 mL | Freq: Once | CUTANEOUS | Status: DC
  Filled 2014-12-11: qty 60

## 2014-12-11 MED ORDER — ONDANSETRON HCL 4 MG/2ML IJ SOLN
4.0000 mg | Freq: Three times a day (TID) | INTRAMUSCULAR | Status: DC | PRN
Start: 2014-12-11 — End: 2014-12-11

## 2014-12-11 MED ORDER — HYDROMORPHONE HCL 1 MG/ML IJ SOLN
2.0000 mg | INTRAMUSCULAR | Status: DC | PRN
  Administered 2014-12-11 (×2): 2 mg via INTRAVENOUS
  Filled 2014-12-11 (×2): qty 2

## 2014-12-11 MED ORDER — ONDANSETRON HCL 4 MG/2ML IJ SOLN
4.0000 mg | Freq: Four times a day (QID) | INTRAMUSCULAR | Status: DC | PRN
  Administered 2014-12-11 – 2014-12-12 (×2): 4 mg via INTRAVENOUS
  Filled 2014-12-11: qty 2

## 2014-12-11 NOTE — Progress Notes (Signed)
Utilization Review Completed.   Tylie Golonka, RN, BSN Nurse Case Manager  

## 2014-12-11 NOTE — Consult Note (Signed)
Reason for Consult:  Right shoulder injury Referring Physician:  Dr. Bland Span is an 68 y.o. female.  HPI:  68 y/o female with PMH of lung cancer c/o right shoulder pain since a fall yesterday.  She had chemo yesterday and was resting comfortably until she got up to get ice.  At that point her legs buckled, and she fell on the R UE.  She c/o severe aching pain in the right shoulder that is worse with motion and better with rest.  She denies numbness, tingling or weakness in the R UE.  She has no h/o injury to the right shoulder.  She is s/p L shoulder hemiarthroplasty back in July '15 by Dr. Tamera Punt.  She is currently taking augmentin as prescribed by her PCP for cough and runny nose.  Past Medical History  Diagnosis Date  . H/O: stroke 2012, 2000,     X3  . Hypertension   . Anxiety   . PTSD (post-traumatic stress disorder)   . Depression   . SVD (spontaneous vaginal delivery)     x 1  . Smoker   . Osteoarthritis     knees, hips hands,   . Shortness of breath   . Pain     SEVERE PAIN BACK, RIGHT HIP AND KNEES--HARRINGTON RODS AND CERVICAL PLATES--USES WALKER OR CANE WHEN AMBULATING - GOES TO A PAIN CLINIC-STATES SHE NEEDS RT HIP AND BOTH KNEES REPLACED. PT STATES SHE WAS TOLD THE CEMENT AROUND THE HARRINGTON RODS IS CRACKED.  [name] Hot flashes, menopausal     SEVERE  . Complication of anesthesia 03/02/13    severe headache,vertigo postop  . COPD (chronic obstructive pulmonary disease)     stable  . 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 Lung cancer   . Status post radiation therapy within four to twelve weeks 10/18/13-12/06/13    lung ca  67Gy  . Family history of anesthesia complication     equipment failure caused problem, suffered a stroke & heartattack    Past Surgical History  Procedure Laterality Date  . Carpal tunnel release      BILATERAL  . Nasal sinus surgery    . Lumbar fusion      L4-S1  . Cervical fusion    . Hernia repair    . Salpingoophorectomy Right    . Vaginal hysterectomy      TAH w/ ovary removal  . Transthoracic echocardiogram  12-03-2011    LVSF NORMAL/ EF 60-65%  . Vulvectomy N/A 03/02/2013    Procedure: WIDE LOCAL EXCISION VULVAR;  Surgeon: Imagene Gurney A. Alycia Rossetti, MD;  Location: [facility];  Service: Gynecology;  Laterality: N/A;  . Co2 laser application N/A 06/20/6332    Procedure: LASER APPLICATION OF THE VULVA;  Surgeon: Janie Morning, MD;  Location: [facility];  Service: Gynecology;  Laterality: N/A;  . Video bronchoscopy Bilateral 09/23/2013    Procedure: VIDEO BRONCHOSCOPY WITHOUT FLUORO;  Surgeon: Tanda Rockers, MD;  Location: [facility];  Service: Cardiopulmonary;  Laterality: Bilateral;  . Hemiarthroplasty shoulder fracture Left 05/25/2014    DR CHANDLER  . Reverse shoulder arthroplasty Left 05/24/2014    Procedure:   Hemi-Arthroplasty  Left Shoulder;  Surgeon: Nita Sells, MD;  Location: [place];  Service: Orthopedics;  Laterality: Left;    Family History  Problem Relation Age of Onset  . Cancer Mother     COLON  . Heart disease Father   . Diabetes Maternal Aunt   .  Cancer Maternal Grandfather     KIDNEY   . Diabetes Maternal Grandfather   . Hypertension Maternal Grandfather   . Heart disease Maternal Grandfather     Social History:  reports that she has been smoking Cigarettes.  She has a 17.5 pack-year smoking history. She has never used smokeless tobacco. She reports that she does not drink alcohol or use illicit drugs.  Allergies:  Allergies  Allergen Reactions  . Carafate [Sucralfate] Nausea Only  . Prednisone     insomnia  . Nsaids     GI Upset  . Aspirin Other (See Comments)    Gi symptoms. Does NOT take ibuprofen or other NSAIDS  . Ibuprofen     Gi upset  . Dulera [Mometasone Furo-Formoterol Fum] Palpitations    Panting " my body was twisted inside and out"    Medications: I have reviewed the patient's current medications.  Results for orders placed or performed during the  hospital encounter of 12/10/14 (from the past 48 hour(s))  Comprehensive metabolic panel     Status: Abnormal   Collection Time: 12/11/14 12:54 AM  Result Value Ref Range   Sodium 132 (L) 137 - 147 mEq/L   Potassium 4.2 3.7 - 5.3 mEq/L   Chloride 94 (L) 96 - 112 mEq/L   CO2 28 19 - 32 mEq/L   Glucose, Bld 276 (H) 70 - 99 mg/dL   BUN 7 6 - 23 mg/dL   Creatinine, Ser 0.71 0.50 - 1.10 mg/dL   Calcium 8.6 8.4 - 10.5 mg/dL   Total Protein 7.2 6.0 - 8.3 g/dL   Albumin 3.6 3.5 - 5.2 g/dL   AST 14 0 - 37 U/L   ALT 10 0 - 35 U/L   Alkaline Phosphatase 131 (H) 39 - 117 U/L   Total Bilirubin <0.2 (L) 0.3 - 1.2 mg/dL   GFR calc non Af Amer 87 (L) >90 mL/min   GFR calc Af Amer >90 >90 mL/min    Comment: (NOTE) The eGFR has been calculated using the CKD EPI equation. This calculation has not been validated in all clinical situations. eGFR's persistently <90 mL/min signify possible Chronic Kidney Disease.    Anion gap 10 5 - 15  CBC with Differential     Status: Abnormal   Collection Time: 12/11/14 12:54 AM  Result Value Ref Range   WBC 9.0 4.0 - 10.5 K/uL   RBC 3.65 (L) 3.87 - 5.11 MIL/uL   Hemoglobin 11.9 (L) 12.0 - 15.0 g/dL   HCT 35.3 (L) 36.0 - 46.0 %   MCV 96.7 78.0 - 100.0 fL   MCH 32.6 26.0 - 34.0 pg   MCHC 33.7 30.0 - 36.0 g/dL   RDW 14.5 11.5 - 15.5 %   Platelets 158 150 - 400 K/uL   Neutrophils Relative % 83 (H) 43 - 77 %   Neutro Abs 7.5 1.7 - 7.7 K/uL   Lymphocytes Relative 10 (L) 12 - 46 %   Lymphs Abs 0.9 0.7 - 4.0 K/uL   Monocytes Relative 6 3 - 12 %   Monocytes Absolute 0.5 0.1 - 1.0 K/uL   Eosinophils Relative 1 0 - 5 %   Eosinophils Absolute 0.1 0.0 - 0.7 K/uL   Basophils Relative 0 0 - 1 %   Basophils Absolute 0.0 0.0 - 0.1 K/uL  Protime-INR     Status: None   Collection Time: 12/11/14 12:54 AM  Result Value Ref Range   Prothrombin Time 12.6 11.6 - 15.2 seconds  INR 0.94 0.00 - 7.67  Basic metabolic panel     Status: Abnormal   Collection Time: 12/11/14   6:00 AM  Result Value Ref Range   Sodium 130 (L) 137 - 147 mEq/L   Potassium 4.4 3.7 - 5.3 mEq/L   Chloride 94 (L) 96 - 112 mEq/L   CO2 25 19 - 32 mEq/L   Glucose, Bld 268 (H) 70 - 99 mg/dL   BUN 6 6 - 23 mg/dL   Creatinine, Ser 0.51 0.50 - 1.10 mg/dL   Calcium 8.4 8.4 - 10.5 mg/dL   GFR calc non Af Amer >90 >90 mL/min   GFR calc Af Amer >90 >90 mL/min    Comment: (NOTE) The eGFR has been calculated using the CKD EPI equation. This calculation has not been validated in all clinical situations. eGFR's persistently <90 mL/min signify possible Chronic Kidney Disease.    Anion gap 11 5 - 15  CBC     Status: Abnormal   Collection Time: 12/11/14  6:00 AM  Result Value Ref Range   WBC 6.5 4.0 - 10.5 K/uL   RBC 3.40 (L) 3.87 - 5.11 MIL/uL   Hemoglobin 10.7 (L) 12.0 - 15.0 g/dL   HCT 32.5 (L) 36.0 - 46.0 %   MCV 95.6 78.0 - 100.0 fL   MCH 31.5 26.0 - 34.0 pg   MCHC 32.9 30.0 - 36.0 g/dL   RDW 14.4 11.5 - 15.5 %   Platelets 144 (L) 150 - 400 K/uL    Dg Chest 1 View  12/10/2014   CLINICAL DATA:  Fall today on to hard surface, shoulder pain, history of metastatic lung cancer.  EXAM: CHEST - 1 VIEW  COMPARISON:  Chest radiograph November 15, 2014  FINDINGS: Limited assessment as patient is rotated to the RIGHT, RIGHT breast attenuation. Cardiomediastinal silhouette grossly normal though, rotation accentuates the RIGHT hilum. Calcified aortic knob. No pleural effusions or focal consolidation. No pneumothorax.  Status post ACDF. LEFT humeral arthroplasty. Comminuted RIGHT humeral fracture dislocation better seen on radiograph from same day, reported separately.  IMPRESSION: No definite acute cardiopulmonary process with a aforementioned limitations.   Electronically Signed   By: Elon Alas   On: 12/10/2014 22:58   Dg Shoulder Right  12/10/2014   CLINICAL DATA:  Fall, landed on a hard surface, shoulder pain. History of metastatic lung cancer.  EXAM: RIGHT SHOULDER - 2+ VIEW  COMPARISON:   None.  FINDINGS: Comminuted RIGHT humerus surgical neck fracture with medial deviation distal body fragments, impaction. At least 4 fracture fragments identified. Widened subacromial joint space. 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 No destructive bony lesions. Soft tissue swelling without subcutaneous gas or radiopaque foreign bodies.  IMPRESSION: RIGHT humerus surgical neck fracture dislocation appears posttraumatic.   Electronically Signed   By: Elon Alas   On: 12/10/2014 22:56   Ct Shoulder Right Wo Contrast  12/11/2014   CLINICAL DATA:  Fall occur today. Tripped and fall injury landing with arm stretched out. Constant pain right shoulder. Chemotherapy yesterday for lung cancer. Humerus fracture on plain films 12/10/2014 for additional evaluation.  EXAM: CT OF THE RIGHT SHOULDER WITHOUT CONTRAST  TECHNIQUE: Multidetector CT imaging was performed according to the standard protocol. Multiplanar CT image reconstructions were also generated.  COMPARISON:  Right shoulder radiographs 12/10/2014. CT chest 11/21/2014.  FINDINGS: Comminuted fractures involving the right proximal humerus. Transverse fracture through the humeral neck with complete medial displacement of the distal fracture fragment inferior and medial to the inferior glenoid.  The humeral head fracture is displaced inferiorly with complete glenohumeral dislocation. Comminuted fractures of the humeral head with displaced greater tuberosity fragments. Additional displaced fragments demonstrated in the region of the fracture. Acromioclavicular joint demonstrates degenerative changes without dislocation. Glenoid appears intact. Associated effusion.  IMPRESSION: Comminuted and displaced fractures of the proximal humeral head and neck with glenohumeral dislocation.   Electronically Signed   By: Lucienne Capers M.D.   On: 12/11/2014 01:15    ROS:  + cough / runny nose.  No recent f/c/n/v.  O/w neg. PE:  Blood pressure  135/71, pulse 72, temperature 98.4 F (36.9 C), temperature source Oral, resp. rate 16, height 5' 6"  (1.676 m), weight 74.39 kg (164 lb), SpO2 100 %. wn wd woman in nad.  A and O x 4.  Mood and affect normal.  EOMi.  resp unlabored.  R shoulder immobilized in a sling and swath.  No gross deformity aside from moderate swelling.  Skin healthy and intact at the right shoulder.  Sens to LT and motor function intact in radial, ulnar and median nerve dist.  No lymphadenopathy of the R UE.  5/5 grip strength on the R.  2+ radial pulse.  Assessment/Plan: R proximal humerus fracture - I explained the nature of the injury to the patient and her family in detail.  I also spoke with Dr. Tamera Punt at the patient's request.  He is agreeable to see the patient in the morning.  At his request I'll make her NPO after midnight.  We'll hold blood thinners and use SCDs in preparation for possible OR tomorrow.  She understands the plan and agrees.  Wylene Simmer 12/11/2014, 2:14 PM

## 2014-12-11 NOTE — H&P (Signed)
PCP:   No PCP Per Patient   Chief Complaint:  [place], rt arm pain  HPI: 68 yo female h/o lung cancer on chemo, ptsd, htn, copd comes in after mechanical fall at home and hurt her rt arm.  Very painful.  Has prox humerus fx.  No loc.  Normal state of health prior to tripping on something at home.  No ortho at [facility] this weekend.  Review of Systems:  Positive and negative as per HPI otherwise all other systems are negative  Past Medical History: Past Medical History  Diagnosis Date  . H/O: stroke 2012, 2000,     X3  . Hypertension   . Anxiety   . PTSD (post-traumatic stress disorder)   . Depression   . SVD (spontaneous vaginal delivery)     x 1  . Smoker   . Osteoarthritis     knees, hips hands,   . Shortness of breath   . Pain     SEVERE PAIN BACK, RIGHT HIP AND KNEES--HARRINGTON RODS AND CERVICAL PLATES--USES WALKER OR CANE WHEN AMBULATING - GOES TO A PAIN CLINIC-STATES SHE NEEDS RT HIP AND BOTH KNEES REPLACED. PT STATES SHE WAS TOLD THE CEMENT AROUND THE HARRINGTON RODS IS CRACKED.  [name] Hot flashes, menopausal     SEVERE  . Complication of anesthesia 03/02/13    severe headache,vertigo postop  . COPD (chronic obstructive pulmonary disease)     stable  . 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 Lung cancer   . Status post radiation therapy within four to twelve weeks 10/18/13-12/06/13    lung ca  66Gy  . Family history of anesthesia complication     equipment failure caused problem, suffered a stroke & heartattack   Past Surgical History  Procedure Laterality Date  . Carpal tunnel release      BILATERAL  . Nasal sinus surgery    . Lumbar fusion      L4-S1  . Cervical fusion    . Hernia repair    . Salpingoophorectomy Right   . Vaginal hysterectomy      TAH w/ ovary removal  . Transthoracic echocardiogram  12-03-2011    LVSF NORMAL/ EF 60-65%  . Vulvectomy N/A 03/02/2013    Procedure: WIDE LOCAL EXCISION VULVAR;  Surgeon: Imagene Gurney A. Alycia Rossetti, MD;  Location: [facility];   Service: Gynecology;  Laterality: N/A;  . Co2 laser application N/A 7/90/2409    Procedure: LASER APPLICATION OF THE VULVA;  Surgeon: Janie Morning, MD;  [facility];  Service: Gynecology;  Laterality: N/A;  . Video bronchoscopy Bilateral 09/23/2013    Procedure: VIDEO BRONCHOSCOPY WITHOUT FLUORO;  Surgeon: Tanda Rockers, MD;  Location: [facility];  Service: Cardiopulmonary;  Laterality: Bilateral;  . Hemiarthroplasty shoulder fracture Left 05/25/2014    DR CHANDLER  . Reverse shoulder arthroplasty Left 05/24/2014    Procedure:   Hemi-Arthroplasty  Left Shoulder;  Surgeon: Nita Sells, MD;  Location: [place];  Service: Orthopedics;  Laterality: Left;    Medications: Prior to Admission medications   Medication Sig Start Date End Date Taking? Authorizing Provider  Aclidinium Bromide (TUDORZA PRESSAIR) 400 MCG/ACT AEPB Inhale 1 puff into the lungs 2 (two) times daily. 11/10/14  Yes Curt Bears, MD  albuterol (PROVENTIL) (2.5 MG/3ML) 0.083% nebulizer solution Take 2.5 mg by nebulization as needed for wheezing.   Yes Historical Provider, MD  albuterol (VENTOLIN HFA) 108 (90 BASE) MCG/ACT inhaler Inhale 2 puffs into the lungs every 6 (  six) hours as needed for wheezing or shortness of breath. 11/10/14  Yes Curt Bears, MD  ALPRAZolam Duanne Moron) 1 MG tablet Take 1 tablet (1 mg total) by mouth every 6 (six) hours as needed for anxiety. 01/01/14  Yes Kinnie Feil, MD  atenolol (TENORMIN) 50 MG tablet Take 1 tablet (50 mg total) by mouth daily. 11/25/13  Yes Velvet Bathe, MD  benzonatate (TESSALON) 200 MG capsule Take 1 capsule (200 mg total) by mouth 3 (three) times daily as needed for cough. 11/15/14  Yes Curt Bears, MD  chlorzoxazone (PARAFON) 500 MG tablet Take 500 mg by mouth 4 (four) times daily as needed for muscle spasms.   Yes Historical Provider, MD  dextromethorphan-guaiFENesin (MUCINEX DM) 30-600 MG per 12 hr tablet Take 1 tablet by mouth every 12  (twelve) hours.   Yes Historical Provider, MD  esomeprazole (NEXIUM) 40 MG capsule Take 40 mg by mouth daily as needed (for acid reflux).    Yes Historical Provider, MD  levothyroxine (SYNTHROID) 50 MCG tablet Take 1 tablet (50 mcg total) by mouth daily before breakfast. 11/23/14  Yes Curt Bears, MD  ondansetron (ZOFRAN ODT) 4 MG disintegrating tablet Take 1 tablet (4 mg total) by mouth every 8 (eight) hours as needed for nausea or vomiting. 04/14/14  Yes Curt Bears, MD  oxyCODONE-acetaminophen (PERCOCET) 7.5-325 MG per tablet Take 1 tablet by mouth every 6 (six) hours as needed for pain. 11/23/14  Yes Curt Bears, MD  promethazine (PHENERGAN) 25 MG tablet Take 25 mg by mouth every 6 (six) hours as needed for nausea or vomiting.   Yes Historical Provider, MD  sertraline (ZOLOFT) 100 MG tablet Take 200 mg by mouth daily.    Yes Historical Provider, MD  trazodone (DESYREL) 300 MG tablet Take 300 mg by mouth at bedtime.   Yes Historical Provider, MD    Allergies:   Allergies  Allergen Reactions  . Carafate [Sucralfate] Nausea Only  . Prednisone     insomnia  . Nsaids     GI Upset  . Aspirin Other (See Comments)    Gi symptoms. Does NOT take ibuprofen or other NSAIDS  . Ibuprofen     Gi upset  . Dulera [Mometasone Furo-Formoterol Fum] Palpitations    Panting " my body was twisted inside and out"    Social History:  reports that she has been smoking Cigarettes.  She has a 17.5 pack-year smoking history. She has never used smokeless tobacco. She reports that she does not drink alcohol or use illicit drugs.  Family History: Family History  Problem Relation Age of Onset  . Cancer Mother     COLON  . Heart disease Father   . Diabetes Maternal Aunt   . Cancer Maternal Grandfather     KIDNEY   . Diabetes Maternal Grandfather   . Hypertension Maternal Grandfather   . Heart disease Maternal Grandfather     Physical Exam: Filed Vitals:   12/10/14 2300 12/10/14 2330  12/11/14 0130 12/11/14 0135  BP: 173/95 173/92 131/98 131/98  Pulse: 72 70  85  Temp:      TempSrc:      Resp:   14 15  Height:      Weight:      SpO2: 93% 91%  91%   General appearance: alert, cooperative and no distress Head: Normocephalic, without obvious abnormality, atraumatic Eyes: negative Nose: Nares normal. Septum midline. Mucosa normal. No drainage or sinus tenderness. Neck: no JVD and supple, symmetrical, trachea midline Lungs:  clear to auscultation bilaterally Heart: regular rate and rhythm, S1, S2 normal, no murmur, click, rub or gallop Abdomen: soft, non-tender; bowel sounds normal; no masses,  no organomegaly Extremities: extremities normal, atraumatic, no cyanosis or edema right shoulder swollen and in sling pulses intact Pulses: 2+ and symmetric Skin: Skin color, texture, turgor normal. No rashes or lesions Neurologic: Grossly normal    Labs on Admission:   Recent Labs  12/09/14 1100 12/11/14 0054  NA 134* 132*  K 4.2 4.2  CL  --  94*  CO2 27 28  GLUCOSE 259* 276*  BUN 8.8 7  CREATININE 0.9 0.71  CALCIUM 8.4 8.6    Recent Labs  12/09/14 1100 12/11/14 0054  AST 15 14  ALT 12 10  ALKPHOS 138 131*  BILITOT 0.52 <0.2*  PROT 7.1 7.2  ALBUMIN 3.7 3.6    Recent Labs  12/09/14 1100 12/11/14 0054  WBC 5.1 9.0  NEUTROABS 3.7 7.5  HGB 13.1 11.9*  HCT 40.7 35.3*  MCV 98.9 96.7  PLT 174 158    Radiological Exams on Admission: Dg Chest 1 View  12/10/2014   CLINICAL DATA:  Fall today on to hard surface, shoulder pain, history of metastatic lung cancer.  EXAM: CHEST - 1 VIEW  COMPARISON:  Chest radiograph November 15, 2014  FINDINGS: Limited assessment as patient is rotated to the RIGHT, RIGHT breast attenuation. Cardiomediastinal silhouette grossly normal though, rotation accentuates the RIGHT hilum. Calcified aortic knob. No pleural effusions or focal consolidation. No pneumothorax.  Status post ACDF. LEFT humeral arthroplasty. Comminuted RIGHT  humeral fracture dislocation better seen on radiograph from same day, reported separately.  IMPRESSION: No definite acute cardiopulmonary process with a aforementioned limitations.   Electronically Signed   By: Elon Alas   On: 12/10/2014 22:58   Dg Shoulder Right  12/10/2014   CLINICAL DATA:  Fall, landed on a hard surface, shoulder pain. History of metastatic lung cancer.  EXAM: RIGHT SHOULDER - 2+ VIEW  COMPARISON:  None.  FINDINGS: Comminuted RIGHT humerus surgical neck fracture with medial deviation distal body fragments, impaction. At least 4 fracture fragments identified. Widened subacromial joint space. 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 No destructive bony lesions. Soft tissue swelling without subcutaneous gas or radiopaque foreign bodies.  IMPRESSION: RIGHT humerus surgical neck fracture dislocation appears posttraumatic.   Electronically Signed   By: Elon Alas   On: 12/10/2014 22:56   Ct Shoulder Right Wo Contrast  12/11/2014   CLINICAL DATA:  Fall occur today. Tripped and fall injury landing with arm stretched out. Constant pain right shoulder. Chemotherapy yesterday for lung cancer. Humerus fracture on plain films 12/10/2014 for additional evaluation.  EXAM: CT OF THE RIGHT SHOULDER WITHOUT CONTRAST  TECHNIQUE: Multidetector CT imaging was performed according to the standard protocol. Multiplanar CT image reconstructions were also generated.  COMPARISON:  Right shoulder radiographs 12/10/2014. CT chest 11/21/2014.  FINDINGS: Comminuted fractures involving the right proximal humerus. Transverse fracture through the humeral neck with complete medial displacement of the distal fracture fragment inferior and medial to the inferior glenoid. The humeral head fracture is displaced inferiorly with complete glenohumeral dislocation. Comminuted fractures of the humeral head with displaced greater tuberosity fragments. Additional displaced fragments demonstrated in  the region of the fracture. Acromioclavicular joint demonstrates degenerative changes without dislocation. Glenoid appears intact. Associated effusion.  IMPRESSION: Comminuted and displaced fractures of the proximal humeral head and neck with glenohumeral dislocation.   Electronically Signed   By: Oren Beckmann.D.  On: 12/11/2014 01:15   Assessment/Plan  68 yo female with mechanical fall with right complex humerus fracture  Principal Problem:   Fracture, humerus closed-  Ortho on call at [facility] called, requested medical admission.  Transfer to [place] for ortho evaluation for any surgical need or not.  obs on med at [facility].  Keep npo for now incase OR in am.  Hold anticoagulants for same reason.  Active Problems:  Stable unless o/w noted   COPD GOLD III   Essential hypertension, benign   Chronic pain   Lung cancer   Fall at home  obs med.  Full code.  Emmani Lesueur A 12/11/2014, 2:02 AM

## 2014-12-11 NOTE — Progress Notes (Signed)
Patient see and evaluated earlier this am by my associate. Please refer to H and P for details regarding assessment and plan.  Will reassess next am.  Velvet Bathe

## 2014-12-12 ENCOUNTER — Encounter (HOSPITAL_COMMUNITY)
Admission: EM | Disposition: A | Discharge status: Skilled Nursing Facility | Payer: Self-pay | Source: Home / Self Care | Attending: Family Medicine

## 2014-12-12 ENCOUNTER — Observation Stay (HOSPITAL_COMMUNITY): Payer: Medicare Other

## 2014-12-12 ENCOUNTER — Observation Stay (HOSPITAL_COMMUNITY): Payer: Medicare Other | Admitting: Anesthesiology

## 2014-12-12 ENCOUNTER — Encounter (HOSPITAL_COMMUNITY): Payer: Self-pay | Admitting: Certified Registered"

## 2014-12-12 DIAGNOSIS — F4310 Post-traumatic stress disorder, unspecified: Secondary | ICD-10-CM | POA: Diagnosis present

## 2014-12-12 DIAGNOSIS — S42209A Unspecified fracture of upper end of unspecified humerus, initial encounter for closed fracture: Secondary | ICD-10-CM | POA: Diagnosis present

## 2014-12-12 DIAGNOSIS — G252 Other specified forms of tremor: Secondary | ICD-10-CM | POA: Diagnosis present

## 2014-12-12 DIAGNOSIS — Y92009 Unspecified place in unspecified non-institutional (private) residence as the place of occurrence of the external cause: Secondary | ICD-10-CM | POA: Diagnosis not present

## 2014-12-12 DIAGNOSIS — Z8249 Family history of ischemic heart disease and other diseases of the circulatory system: Secondary | ICD-10-CM | POA: Diagnosis not present

## 2014-12-12 DIAGNOSIS — Z833 Family history of diabetes mellitus: Secondary | ICD-10-CM | POA: Diagnosis not present

## 2014-12-12 DIAGNOSIS — D62 Acute posthemorrhagic anemia: Secondary | ICD-10-CM | POA: Diagnosis not present

## 2014-12-12 DIAGNOSIS — S42309A Unspecified fracture of shaft of humerus, unspecified arm, initial encounter for closed fracture: Secondary | ICD-10-CM | POA: Diagnosis present

## 2014-12-12 DIAGNOSIS — F329 Major depressive disorder, single episode, unspecified: Secondary | ICD-10-CM | POA: Diagnosis present

## 2014-12-12 DIAGNOSIS — J449 Chronic obstructive pulmonary disease, unspecified: Secondary | ICD-10-CM | POA: Diagnosis present

## 2014-12-12 DIAGNOSIS — I10 Essential (primary) hypertension: Secondary | ICD-10-CM | POA: Diagnosis present

## 2014-12-12 DIAGNOSIS — J189 Pneumonia, unspecified organism: Secondary | ICD-10-CM | POA: Diagnosis not present

## 2014-12-12 DIAGNOSIS — F419 Anxiety disorder, unspecified: Secondary | ICD-10-CM | POA: Diagnosis present

## 2014-12-12 DIAGNOSIS — W1830XA Fall on same level, unspecified, initial encounter: Secondary | ICD-10-CM | POA: Diagnosis present

## 2014-12-12 DIAGNOSIS — I69398 Other sequelae of cerebral infarction: Secondary | ICD-10-CM | POA: Diagnosis not present

## 2014-12-12 DIAGNOSIS — Z923 Personal history of irradiation: Secondary | ICD-10-CM | POA: Diagnosis not present

## 2014-12-12 DIAGNOSIS — W010XXA Fall on same level from slipping, tripping and stumbling without subsequent striking against object, initial encounter: Secondary | ICD-10-CM | POA: Diagnosis present

## 2014-12-12 DIAGNOSIS — M25561 Pain in right knee: Secondary | ICD-10-CM | POA: Diagnosis present

## 2014-12-12 DIAGNOSIS — G8929 Other chronic pain: Secondary | ICD-10-CM | POA: Diagnosis present

## 2014-12-12 DIAGNOSIS — S42231A 3-part fracture of surgical neck of right humerus, initial encounter for closed fracture: Secondary | ICD-10-CM | POA: Diagnosis present

## 2014-12-12 DIAGNOSIS — Z85118 Personal history of other malignant neoplasm of bronchus and lung: Secondary | ICD-10-CM | POA: Diagnosis not present

## 2014-12-12 DIAGNOSIS — F17210 Nicotine dependence, cigarettes, uncomplicated: Secondary | ICD-10-CM | POA: Diagnosis present

## 2014-12-12 DIAGNOSIS — Z809 Family history of malignant neoplasm, unspecified: Secondary | ICD-10-CM | POA: Diagnosis not present

## 2014-12-12 DIAGNOSIS — M1990 Unspecified osteoarthritis, unspecified site: Secondary | ICD-10-CM | POA: Diagnosis present

## 2014-12-12 HISTORY — PX: ORIF HUMERUS FRACTURE: SHX2126

## 2014-12-12 SURGERY — OPEN REDUCTION INTERNAL FIXATION (ORIF) PROXIMAL HUMERUS FRACTURE
Anesthesia: General | Site: Shoulder | Laterality: Right

## 2014-12-12 MED ORDER — NEOSTIGMINE METHYLSULFATE 10 MG/10ML IV SOLN
INTRAVENOUS | Status: DC | PRN
  Administered 2014-12-12: 4 mg via INTRAVENOUS

## 2014-12-12 MED ORDER — PHENYLEPHRINE HCL 10 MG/ML IJ SOLN
10.0000 mg | INTRAVENOUS | Status: DC | PRN
  Administered 2014-12-12: 30 ug/min via INTRAVENOUS

## 2014-12-12 MED ORDER — PROPOFOL 10 MG/ML IV BOLUS
INTRAVENOUS | Status: AC
  Filled 2014-12-12: qty 20

## 2014-12-12 MED ORDER — ONDANSETRON HCL 4 MG PO TABS: 4.0000 mg | ORAL_TABLET | Freq: Four times a day (QID) | ORAL | Status: DC | PRN

## 2014-12-12 MED ORDER — ONDANSETRON HCL 4 MG/2ML IJ SOLN
INTRAMUSCULAR | Status: DC | PRN
  Administered 2014-12-12: 4 mg via INTRAVENOUS

## 2014-12-12 MED ORDER — PHENOL 1.4 % MT LIQD: 1.0000 | OROMUCOSAL | Status: DC | PRN

## 2014-12-12 MED ORDER — ALBUTEROL SULFATE (2.5 MG/3ML) 0.083% IN NEBU: 2.5000 mg | INHALATION_SOLUTION | RESPIRATORY_TRACT | Status: DC | PRN

## 2014-12-12 MED ORDER — ONDANSETRON HCL 4 MG/2ML IJ SOLN: 4.0000 mg | Freq: Four times a day (QID) | INTRAMUSCULAR | Status: DC | PRN

## 2014-12-12 MED ORDER — DIPHENHYDRAMINE HCL 12.5 MG/5ML PO ELIX: 12.5000 mg | ORAL_SOLUTION | ORAL | Status: DC | PRN

## 2014-12-12 MED ORDER — FENTANYL CITRATE 0.05 MG/ML IJ SOLN
INTRAMUSCULAR | Status: AC
  Filled 2014-12-12: qty 2

## 2014-12-12 MED ORDER — SODIUM CHLORIDE 0.9 % IV SOLN
INTRAVENOUS | Status: DC
  Administered 2014-12-12: 21:00:00 via INTRAVENOUS

## 2014-12-12 MED ORDER — FENTANYL CITRATE 0.05 MG/ML IJ SOLN
INTRAMUSCULAR | Status: AC
  Filled 2014-12-12: qty 5

## 2014-12-12 MED ORDER — PROPOFOL 10 MG/ML IV BOLUS
INTRAVENOUS | Status: DC | PRN
  Administered 2014-12-12: 120 mg via INTRAVENOUS

## 2014-12-12 MED ORDER — ASPIRIN EC 325 MG PO TBEC
325.0000 mg | DELAYED_RELEASE_TABLET | Freq: Two times a day (BID) | ORAL | Status: DC
Start: 2014-12-12 — End: 2014-12-15
  Administered 2014-12-13 – 2014-12-15 (×4): 325 mg via ORAL
  Filled 2014-12-12 (×9): qty 1

## 2014-12-12 MED ORDER — GLYCOPYRROLATE 0.2 MG/ML IJ SOLN
INTRAMUSCULAR | Status: DC | PRN
Start: 2014-12-12 — End: 2014-12-12
  Administered 2014-12-12: .6 mg via INTRAVENOUS

## 2014-12-12 MED ORDER — BUPIVACAINE-EPINEPHRINE (PF) 0.5% -1:200000 IJ SOLN
INTRAMUSCULAR | Status: DC | PRN
  Administered 2014-12-12: 20 mL via PERINEURAL

## 2014-12-12 MED ORDER — FENTANYL CITRATE 0.05 MG/ML IJ SOLN
INTRAMUSCULAR | Status: DC | PRN
  Administered 2014-12-12: 50 ug via INTRAVENOUS

## 2014-12-12 MED ORDER — 0.9 % SODIUM CHLORIDE (POUR BTL) OPTIME
TOPICAL | Status: DC | PRN
  Administered 2014-12-12: 1000 mL

## 2014-12-12 MED ORDER — ONDANSETRON HCL 4 MG/2ML IJ SOLN
INTRAMUSCULAR | Status: AC
  Filled 2014-12-12: qty 2

## 2014-12-12 MED ORDER — MIDAZOLAM HCL 5 MG/ML IJ SOLN
2.0000 mg | Freq: Once | INTRAMUSCULAR | Status: DC
Start: 2014-12-12 — End: 2014-12-15

## 2014-12-12 MED ORDER — ACETAMINOPHEN 650 MG RE SUPP: 650.0000 mg | Freq: Four times a day (QID) | RECTAL | Status: DC | PRN

## 2014-12-12 MED ORDER — HYDROCODONE-ACETAMINOPHEN 5-325 MG PO TABS
1.0000 | ORAL_TABLET | ORAL | Status: DC | PRN
  Administered 2014-12-13: 2 via ORAL
  Administered 2014-12-13 (×2): 1 via ORAL
  Administered 2014-12-14: 2 via ORAL
  Filled 2014-12-12: qty 1
  Filled 2014-12-12 (×2): qty 2
  Filled 2014-12-12: qty 1

## 2014-12-12 MED ORDER — MIDAZOLAM HCL 2 MG/2ML IJ SOLN
INTRAMUSCULAR | Status: AC
  Administered 2014-12-12: 1 mg
  Filled 2014-12-12: qty 2

## 2014-12-12 MED ORDER — MIDAZOLAM HCL 2 MG/2ML IJ SOLN
INTRAMUSCULAR | Status: AC
  Filled 2014-12-12: qty 2

## 2014-12-12 MED ORDER — LIDOCAINE HCL (CARDIAC) 20 MG/ML IV SOLN
INTRAVENOUS | Status: DC | PRN
Start: 2014-12-12 — End: 2014-12-12
  Administered 2014-12-12: 100 mg via INTRAVENOUS

## 2014-12-12 MED ORDER — CEFAZOLIN SODIUM 1-5 GM-% IV SOLN
1.0000 g | Freq: Four times a day (QID) | INTRAVENOUS | Status: AC
  Administered 2014-12-12 – 2014-12-13 (×3): 1 g via INTRAVENOUS
  Filled 2014-12-12 (×3): qty 50

## 2014-12-12 MED ORDER — CEFAZOLIN SODIUM-DEXTROSE 2-3 GM-% IV SOLR
INTRAVENOUS | Status: DC | PRN
  Administered 2014-12-12: 2 g via INTRAVENOUS

## 2014-12-12 MED ORDER — FENTANYL CITRATE 0.05 MG/ML IJ SOLN
100.0000 ug | Freq: Once | INTRAMUSCULAR | Status: AC
  Administered 2014-12-12: 100 ug via INTRAVENOUS

## 2014-12-12 MED ORDER — ACETAMINOPHEN 325 MG PO TABS
650.0000 mg | ORAL_TABLET | Freq: Four times a day (QID) | ORAL | Status: DC | PRN
Start: 2014-12-12 — End: 2014-12-12

## 2014-12-12 MED ORDER — DOCUSATE SODIUM 100 MG PO CAPS
100.0000 mg | ORAL_CAPSULE | Freq: Two times a day (BID) | ORAL | Status: DC
  Administered 2014-12-13 – 2014-12-15 (×5): 100 mg via ORAL
  Filled 2014-12-12 (×9): qty 1

## 2014-12-12 MED ORDER — LIDOCAINE HCL (CARDIAC) 20 MG/ML IV SOLN
INTRAVENOUS | Status: AC
  Filled 2014-12-12: qty 5

## 2014-12-12 MED ORDER — FENTANYL CITRATE 0.05 MG/ML IJ SOLN
25.0000 ug | INTRAMUSCULAR | Status: DC | PRN
Start: 2014-12-12 — End: 2014-12-12
  Administered 2014-12-12: 50 ug via INTRAVENOUS

## 2014-12-12 MED ORDER — MENTHOL 3 MG MT LOZG: 1.0000 | LOZENGE | OROMUCOSAL | Status: DC | PRN

## 2014-12-12 MED ORDER — ROCURONIUM BROMIDE 50 MG/5ML IV SOLN
INTRAVENOUS | Status: AC
  Filled 2014-12-12: qty 1

## 2014-12-12 MED ORDER — METOCLOPRAMIDE HCL 10 MG PO TABS: 5.0000 mg | ORAL_TABLET | Freq: Three times a day (TID) | ORAL | Status: DC | PRN

## 2014-12-12 MED ORDER — TIOTROPIUM BROMIDE MONOHYDRATE 18 MCG IN CAPS
18.0000 ug | ORAL_CAPSULE | Freq: Every day | RESPIRATORY_TRACT | Status: DC
  Administered 2014-12-12 – 2014-12-15 (×3): 18 ug via RESPIRATORY_TRACT
  Filled 2014-12-12: qty 5

## 2014-12-12 MED ORDER — METOCLOPRAMIDE HCL 5 MG/ML IJ SOLN: 5.0000 mg | Freq: Three times a day (TID) | INTRAMUSCULAR | Status: DC | PRN

## 2014-12-12 MED ORDER — ALBUTEROL SULFATE (2.5 MG/3ML) 0.083% IN NEBU
2.5000 mg | INHALATION_SOLUTION | Freq: Four times a day (QID) | RESPIRATORY_TRACT | Status: DC
  Administered 2014-12-12 – 2014-12-15 (×7): 2.5 mg via RESPIRATORY_TRACT
  Filled 2014-12-12 (×9): qty 3

## 2014-12-12 MED ORDER — ACETAMINOPHEN 325 MG PO TABS: 650.0000 mg | ORAL_TABLET | Freq: Four times a day (QID) | ORAL | Status: DC | PRN

## 2014-12-12 MED ORDER — PROMETHAZINE HCL 25 MG/ML IJ SOLN: 6.2500 mg | INTRAMUSCULAR | Status: DC | PRN

## 2014-12-12 MED ORDER — ALUMINUM HYDROXIDE GEL 320 MG/5ML PO SUSP
15.0000 mL | ORAL | Status: DC | PRN
  Filled 2014-12-12: qty 30

## 2014-12-12 MED ORDER — ROCURONIUM BROMIDE 100 MG/10ML IV SOLN
INTRAVENOUS | Status: DC | PRN
  Administered 2014-12-12: 40 mg via INTRAVENOUS

## 2014-12-12 MED ORDER — MEPERIDINE HCL 25 MG/ML IJ SOLN: 6.2500 mg | INTRAMUSCULAR | Status: DC | PRN

## 2014-12-12 SURGICAL SUPPLY — 73 items
BIT DRILL 4 LONG FAST STEP (BIT) ×2 IMPLANT
BIT DRILL 4 SHORT FAST STEP (BIT) ×2 IMPLANT
CHLORAPREP W/TINT 26ML (MISCELLANEOUS) ×3 IMPLANT
CLOSURE WOUND 1/2 X4 (GAUZE/BANDAGES/DRESSINGS) ×1
COVER SURGICAL LIGHT HANDLE (MISCELLANEOUS) ×6 IMPLANT
DRAPE C-ARM 42X72 X-RAY (DRAPES) ×3 IMPLANT
DRAPE IMP U-DRAPE 54X76 (DRAPES) ×3 IMPLANT
DRAPE INCISE IOBAN 66X45 STRL (DRAPES) ×3 IMPLANT
DRAPE PROXIMA HALF (DRAPES) ×3 IMPLANT
DRAPE SURG 17X23 STRL (DRAPES) ×3 IMPLANT
DRAPE U-SHAPE 47X51 STRL (DRAPES) ×3 IMPLANT
DRILL BIT 2.8MM DB28 (MISCELLANEOUS) ×2 IMPLANT
DRSG AQUACEL AG ADV 3.5X10 (GAUZE/BANDAGES/DRESSINGS) ×2 IMPLANT
DRSG EMULSION OIL 3X3 NADH (GAUZE/BANDAGES/DRESSINGS) ×3 IMPLANT
DRSG MEPILEX BORDER 4X8 (GAUZE/BANDAGES/DRESSINGS) ×2 IMPLANT
DRSG PAD ABDOMINAL 8X10 ST (GAUZE/BANDAGES/DRESSINGS) ×3 IMPLANT
ELECT REM PT RETURN 9FT ADLT (ELECTROSURGICAL) ×3
ELECTRODE REM PT RTRN 9FT ADLT (ELECTROSURGICAL) ×1 IMPLANT
GAUZE SPONGE 4X4 12PLY STRL (GAUZE/BANDAGES/DRESSINGS) IMPLANT
GLOVE BIO SURGEON STRL SZ7 (GLOVE) ×3 IMPLANT
GLOVE BIO SURGEON STRL SZ7.5 (GLOVE) ×3 IMPLANT
GLOVE BIOGEL PI IND STRL 7.0 (GLOVE) ×1 IMPLANT
GLOVE BIOGEL PI IND STRL 8 (GLOVE) ×1 IMPLANT
GLOVE BIOGEL PI INDICATOR 7.0 (GLOVE) ×2
GLOVE BIOGEL PI INDICATOR 8 (GLOVE) ×2
GOWN STRL REUS W/ TWL LRG LVL3 (GOWN DISPOSABLE) ×3 IMPLANT
GOWN STRL REUS W/ TWL XL LVL3 (GOWN DISPOSABLE) ×1 IMPLANT
GOWN STRL REUS W/TWL LRG LVL3 (GOWN DISPOSABLE) ×9
GOWN STRL REUS W/TWL XL LVL3 (GOWN DISPOSABLE) ×3
KIT BASIN OR (CUSTOM PROCEDURE TRAY) ×3 IMPLANT
KIT ROOM TURNOVER OR (KITS) ×3 IMPLANT
MANIFOLD NEPTUNE II (INSTRUMENTS) ×3 IMPLANT
NDL SUT 2 .5 CRC MAYO 1.732X (NEEDLE) ×1 IMPLANT
NDL SUT 6 .5 CRC .975X.05 MAYO (NEEDLE) ×1 IMPLANT
NEEDLE 22X1 1/2 (OR ONLY) (NEEDLE) IMPLANT
NEEDLE MAYO TAPER (NEEDLE) ×12
NS IRRIG 1000ML POUR BTL (IV SOLUTION) ×3 IMPLANT
PACK SHOULDER (CUSTOM PROCEDURE TRAY) ×3 IMPLANT
PACK UNIVERSAL I (CUSTOM PROCEDURE TRAY) ×3 IMPLANT
PAD ARMBOARD 7.5X6 YLW CONV (MISCELLANEOUS) ×6 IMPLANT
PEG STND 4.0X30MM (Orthopedic Implant) ×3 IMPLANT
PEG STND 4.0X35MM (Orthopedic Implant) ×3 IMPLANT
PEG STND 4.0X37.5MM (Orthopedic Implant) ×6 IMPLANT
PEG STND 4.0X42.5MM (Orthopedic Implant) ×6 IMPLANT
PEG STND 4.0X45.0MM (Orthopedic Implant) ×3 IMPLANT
PEGSTD 4.0X30MM (Orthopedic Implant) IMPLANT
PEGSTD 4.0X35MM (Orthopedic Implant) IMPLANT
PEGSTD 4.0X37.5MM (Orthopedic Implant) IMPLANT
PEGSTD 4.0X42.5MM (Orthopedic Implant) IMPLANT
PEGSTD 4.0X45.0MM (Orthopedic Implant) IMPLANT
PLATE SHOULDER S3 3HOLE RT (Plate) ×2 IMPLANT
SCREW LOCK 90D ANGLED 3.8X26 (Screw) ×4 IMPLANT
SCREW MULTIDIR 3.8X26 HUMRL (Screw) ×2 IMPLANT
SPONGE LAP 18X18 X RAY DECT (DISPOSABLE) ×6 IMPLANT
STAPLER VISISTAT 35W (STAPLE) ×3 IMPLANT
STRIP CLOSURE SKIN 1/2X4 (GAUZE/BANDAGES/DRESSINGS) ×2 IMPLANT
SUCTION FRAZIER TIP 10 FR DISP (SUCTIONS) ×3 IMPLANT
SUPPORT WRAP ARM LG (MISCELLANEOUS) ×3 IMPLANT
SUT BONE WAX W31G (SUTURE) IMPLANT
SUT ETHIBOND NAB CT1 #1 30IN (SUTURE) ×4 IMPLANT
SUT FIBERWIRE #2 38 T-5 BLUE (SUTURE) ×12
SUT MNCRL AB 4-0 PS2 18 (SUTURE) ×3 IMPLANT
SUT VIC AB 0 CT1 27 (SUTURE) ×3
SUT VIC AB 0 CT1 27XBRD ANBCTR (SUTURE) ×1 IMPLANT
SUT VIC AB 2-0 CT1 27 (SUTURE) ×6
SUT VIC AB 2-0 CT1 TAPERPNT 27 (SUTURE) ×2 IMPLANT
SUT VICRYL 4-0 PS2 18IN ABS (SUTURE) ×3 IMPLANT
SUTURE FIBERWR #2 38 T-5 BLUE (SUTURE) ×2 IMPLANT
SYR CONTROL 10ML LL (SYRINGE) IMPLANT
TOWEL OR 17X24 6PK STRL BLUE (TOWEL DISPOSABLE) ×3 IMPLANT
TOWEL OR 17X26 10 PK STRL BLUE (TOWEL DISPOSABLE) ×3 IMPLANT
WATER STERILE IRR 1000ML POUR (IV SOLUTION) ×1 IMPLANT
YANKAUER SUCT BULB TIP NO VENT (SUCTIONS) ×3 IMPLANT

## 2014-12-12 NOTE — Progress Notes (Signed)
[facility] PROGRESS NOTE  [name] XTK:[id number] DOB: 1946/01/26 DOA: 12/10/2014 PCP: No PCP Per Patient  Assessment/Plan: Principal Problem:   Fracture, humerus closed - Ortho on board and assisting - Pain control  Active Problems:   COPD GOLD III - stable continue albuterol    Essential hypertension, benign - Pt on atenolol with last pressure recorded at 111/65    Chronic pain - Pain control, please see order list    Lung cancer - Will continue patient's home regimen   Code Status: full Family Communication:  Disposition Plan:    Consultants:  The orthopedic surgery  Procedures:  Pending  Antibiotics:  None  HPI/Subjective: Patient was in the OR  Objective: Filed Vitals:   12/12/14 1454  BP: 111/65  Pulse: 78  Temp:   Resp: 14    Intake/Output Summary (Last 24 hours) at 12/12/14 1633 Last data filed at 12/12/14 0700  Gross per 24 hour  Intake      0 ml  Output      0 ml  Net      0 ml   Filed Weights   12/10/14 2147  Weight: 74.39 kg (164 lb)    Exam:   Vital signs reviewed patient was not in room for evaluation  Data Reviewed: Basic Metabolic Panel:  Recent Labs Lab 12/09/14 1100 12/11/14 0054 12/11/14 0600  NA 134* 132* 130*  K 4.2 4.2 4.4  CL  --  94* 94*  CO2 27 28 25   GLUCOSE 259* 276* 268*  BUN 8.8 7 6   CREATININE 0.9 0.71 0.51  CALCIUM 8.4 8.6 8.4   Liver Function Tests:  Recent Labs Lab 12/09/14 1100 12/11/14 0054  AST 15 14  ALT 12 10  ALKPHOS 138 131*  BILITOT 0.52 <0.2*  PROT 7.1 7.2  ALBUMIN 3.7 3.6   No results for input(s): LIPASE, AMYLASE in the last 168 hours. No results for input(s): AMMONIA in the last 168 hours. CBC:  Recent Labs Lab 12/09/14 1100 12/11/14 0054 12/11/14 0600  WBC 5.1 9.0 6.5  NEUTROABS 3.7 7.5  --   HGB 13.1 11.9* 10.7*  HCT 40.7 35.3* 32.5*  MCV 98.9 96.7 95.6  PLT 174 158 144*   Cardiac Enzymes: No results for input(s): CKTOTAL, CKMB, CKMBINDEX,  TROPONINI in the last 168 hours. BNP (last 3 results) No results for input(s): PROBNP in the last 8760 hours. CBG: No results for input(s): GLUCAP in the last 168 hours.  Recent Results (from the past 240 hour(s))  MRSA PCR Screening     Status: None   Collection Time: 12/11/14  9:27 PM  Result Value Ref Range Status   MRSA by PCR NEGATIVE NEGATIVE Final    Comment:        The GeneXpert MRSA Assay (FDA approved for NASAL specimens only), is one component of a comprehensive MRSA colonization surveillance program. It is not intended to diagnose MRSA infection nor to guide or monitor treatment for MRSA infections.      Studies: Dg Chest 1 View  12/10/2014   CLINICAL DATA:  Fall today on to hard surface, shoulder pain, history of metastatic lung cancer.  EXAM: CHEST - 1 VIEW  COMPARISON:  Chest radiograph November 15, 2014  FINDINGS: Limited assessment as patient is rotated to the RIGHT, RIGHT breast attenuation. Cardiomediastinal silhouette grossly normal though, rotation accentuates the RIGHT hilum. Calcified aortic knob. No pleural effusions or focal consolidation. No pneumothorax.  Status post ACDF. LEFT humeral arthroplasty. Comminuted RIGHT humeral  fracture dislocation better seen on radiograph from same day, reported separately.  IMPRESSION: No definite acute cardiopulmonary process with a aforementioned limitations.   Electronically Signed   By: Elon Alas   On: 12/10/2014 22:58   Dg Shoulder Right  12/10/2014   CLINICAL DATA:  Fall, landed on a hard surface, shoulder pain. History of metastatic lung cancer.  EXAM: RIGHT SHOULDER - 2+ VIEW  COMPARISON:  None.  FINDINGS: Comminuted RIGHT humerus surgical neck fracture with medial deviation distal body fragments, impaction. At least 4 fracture fragments identified. Widened subacromial joint space. 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 No destructive bony lesions. Soft tissue swelling without subcutaneous  gas or radiopaque foreign bodies.  IMPRESSION: RIGHT humerus surgical neck fracture dislocation appears posttraumatic.   Electronically Signed   By: Elon Alas   On: 12/10/2014 22:56   Dg Knee 1-2 Views Right  12/12/2014   CLINICAL DATA:  Acute right knee pain, fall Saturday  EXAM: RIGHT KNEE - 1-2 VIEW  COMPARISON:  None.  FINDINGS: Two views of the right knee submitted. Diffuse mild narrowing of joint space. There is chondrocalcinosis. Narrowing of patellofemoral joint space. Spurring of patella. No joint effusion. No acute fracture or subluxation.  IMPRESSION: No acute fracture or subluxation. Osteoarthritic changes as described above. Chondrocalcinosis.   Electronically Signed   By: Lahoma Crocker M.D.   On: 12/12/2014 09:22   Ct Shoulder Right Wo Contrast  12/11/2014   CLINICAL DATA:  Fall occur today. Tripped and fall injury landing with arm stretched out. Constant pain right shoulder. Chemotherapy yesterday for lung cancer. Humerus fracture on plain films 12/10/2014 for additional evaluation.  EXAM: CT OF THE RIGHT SHOULDER WITHOUT CONTRAST  TECHNIQUE: Multidetector CT imaging was performed according to the standard protocol. Multiplanar CT image reconstructions were also generated.  COMPARISON:  Right shoulder radiographs 12/10/2014. CT chest 11/21/2014.  FINDINGS: Comminuted fractures involving the right proximal humerus. Transverse fracture through the humeral neck with complete medial displacement of the distal fracture fragment inferior and medial to the inferior glenoid. The humeral head fracture is displaced inferiorly with complete glenohumeral dislocation. Comminuted fractures of the humeral head with displaced greater tuberosity fragments. Additional displaced fragments demonstrated in the region of the fracture. Acromioclavicular joint demonstrates degenerative changes without dislocation. Glenoid appears intact. Associated effusion.  IMPRESSION: Comminuted and displaced fractures of the  proximal humeral head and neck with glenohumeral dislocation.   Electronically Signed   By: Lucienne Capers M.D.   On: 12/11/2014 01:15   Dg Chest Port 1 View  12/12/2014   CLINICAL DATA:  Fever, bronchitis, history of lung cancer, fall with right shoulder fracture  EXAM: PORTABLE CHEST - 1 VIEW  COMPARISON:  A CT right shoulder dated 12/11/2014. Chest radiograph dated 12/10/2014. CT chest dated 11/21/2014.  FINDINGS: Mild patchy right middle lobe opacity, likely reflecting paramediastinal radiation changes when correlating with prior CT.  Mild patchy left basilar opacity, possibly radiation changes, pneumonia not excluded.  No pleural effusion or pneumothorax.  The heart is normal in size.  Comminuted, displaced right proximal humeral fracture, better evaluated on prior CT. Left shoulder arthroplasty. Cervical spine fixation hardware.  IMPRESSION: Mild patchy left basilar opacity, possibly radiation changes, marrow excluded.  Mild patchy right middle lobe opacity, likely reflecting paramediastinal radiation changes.  Comminuted, displaced right proximal humeral fracture, better evaluated on CT.   Electronically Signed   By: Julian Hy M.D.   On: 12/12/2014 07:20    Scheduled Meds: . [MAR Hold] albuterol  2.5 mg Nebulization Q6H  . [MAR Hold] atenolol  50 mg Oral Daily  . chlorhexidine  60 mL Topical Once  . fentaNYL      . midazolam  2 mg Intravenous Once  . [MAR Hold] sertraline  100 mg Oral Daily  . [MAR Hold] tiotropium  18 mcg Inhalation Daily   Continuous Infusions: . lactated ringers 50 mL/hr at 12/12/14 1425    Time spent: > 35 minutes    Velvet Bathe  [facility] Pager [phone] If 7PM-7AM, please contact night-coverage at [email or web address], password [facility] 12/12/2014, 4:33 PM  LOS: 2 days

## 2014-12-12 NOTE — Progress Notes (Signed)
UR completed 

## 2014-12-12 NOTE — Anesthesia Preprocedure Evaluation (Signed)
Anesthesia Evaluation  Patient identified by MRN, date of birth, ID band  Reviewed: Allergy & Precautions, H&P , NPO status , Patient's Chart, lab work & pertinent test results, reviewed documented beta blocker date and time   Airway        Dental   Pulmonary COPD oxygen dependent, Current Smoker,  Lung CA, SP radiation RX         Cardiovascular hypertension, Pt. on medications     Neuro/Psych Anxiety Depression    GI/Hepatic   Endo/Other    Renal/GU      Musculoskeletal   Abdominal   Peds  Hematology   Anesthesia Other Findings   Reproductive/Obstetrics                             Anesthesia Physical Anesthesia Plan  ASA: III  Anesthesia Plan: General   Post-op Pain Management: MAC Combined w/ Regional for Post-op pain   Induction: Intravenous  Airway Management Planned: Oral ETT  Additional Equipment:   Intra-op Plan:   Post-operative Plan: Extubation in OR  Informed Consent: I have reviewed the patients History and Physical, chart, labs and discussed the procedure including the risks, benefits and alternatives for the proposed anesthesia with the patient or authorized representative who has indicated his/her understanding and acceptance.     Plan Discussed with:   Anesthesia Plan Comments:         Anesthesia Quick Evaluation

## 2014-12-12 NOTE — Anesthesia Postprocedure Evaluation (Signed)
Anesthesia Post Note  Patient: [name]  Procedure(s) Performed: Procedure(s) (LRB): OPEN REDUCTION INTERNAL FIXATION (ORIF) PROXIMAL HUMERUS FRACTURE (Right)  Anesthesia type: General  Patient location: PACU  Post pain: Pain level controlled and Adequate analgesia  Post assessment: Post-op Vital signs reviewed, Patient's Cardiovascular Status Stable, Respiratory Function Stable, Patent Airway and Pain level controlled  Last Vitals:  Filed Vitals:   12/12/14 1752  BP: 147/109  Pulse:   Temp:   Resp:     Post vital signs: Reviewed and stable  Level of consciousness: awake, alert  and oriented  Complications: No apparent anesthesia complications

## 2014-12-12 NOTE — Transfer of Care (Signed)
Immediate Anesthesia Transfer of Care Note  Patient: [name]  Procedure(s) Performed: Procedure(s): OPEN REDUCTION INTERNAL FIXATION (ORIF) PROXIMAL HUMERUS FRACTURE (Right)  Patient Location: PACU  Anesthesia Type:General  Level of Consciousness: awake, alert  and patient cooperative  Airway & Oxygen Therapy: Patient Spontanous Breathing and Patient connected to nasal cannula oxygen  Post-op Assessment: Report given to PACU RN and Post -op Vital signs reviewed and stable  Post vital signs: Reviewed and stable  Complications: No apparent anesthesia complications

## 2014-12-12 NOTE — Anesthesia Procedure Notes (Addendum)
Anesthesia Regional Block:  Interscalene brachial plexus block  Pre-Anesthetic Checklist: ,, timeout performed, Correct Patient, Correct Site, Correct Laterality, Correct Procedure, Correct Position, site marked, Risks and benefits discussed,  Surgical consent,  Pre-op evaluation,  At surgeon's request and post-op pain management  Laterality: Right and Upper  Prep: Maximum Sterile Barrier Precautions used and chloraprep       Needles:  Injection technique: Single-shot  Needle Type: Stimulator Needle - 40     Needle Length: 5cm 5 cm Needle Gauge: 21 and 21 G    Additional Needles:  Procedures: nerve stimulator Interscalene brachial plexus block  Nerve Stimulator or Paresthesia:  Response: 0.4 mA,   Additional Responses:   Narrative:  Start time: 12/12/2014 2:35 PM End time: 12/12/2014 2:45 PM Anesthesiologist: MANNY, SR,  THEODORE B  Additional Notes: R IS block done with stimulator down to .4, 20cc marcaine .5% with eoi, multiple asp, talked to patient throughout, no complications, Dr. Redmond School assisted me   Procedure Name: Intubation Date/Time: 12/12/2014 3:37 PM Performed by: Julian Reil Pre-anesthesia Checklist: Patient identified, Emergency Drugs available, Suction available and Patient being monitored Patient Re-evaluated:Patient Re-evaluated prior to inductionOxygen Delivery Method: Circle system utilized Preoxygenation: Pre-oxygenation with 100% oxygen Intubation Type: IV induction Ventilation: Mask ventilation without difficulty Laryngoscope Size: Mac and 3 Grade View: Grade I Tube type: Oral Tube size: 7.0 mm Number of attempts: 1 Airway Equipment and Method: Stylet Placement Confirmation: ETT inserted through vocal cords under direct vision,  positive ETCO2 and breath sounds checked- equal and bilateral Secured at: 22 cm Tube secured with: Tape Dental Injury: Teeth and Oropharynx as per pre-operative assessment

## 2014-12-12 NOTE — Op Note (Signed)
Procedure(s): OPEN REDUCTION INTERNAL FIXATION (ORIF) PROXIMAL HUMERUS FRACTURE Procedure Note  [name] female 68 y.o. 12/12/2014  Procedure(s) and Anesthesia Type:    * OPEN REDUCTION INTERNAL FIXATION (ORIF) RIGHT 3 PART PROXIMAL HUMERUS FRACTURE - General  Surgeon(s) and Role:    * Nita Sells, MD - Primary   Indications:  68 y.o. female  severely displaced right 3 part proximal humerus fracture. Indicated for surgery to try and decrease pain and restore function. She understood risks benefits alternatives procedure including but not limited to neurovascular damage, nonunion malunion and potential need for future hardware removal and revision surgery.     Surgeon: Nita Sells   Assistants: Jeanmarie Hubert PA-C [facility] was present and scrubbed throughout the procedure and was essential in positioning, retraction, exposure, and closure)  Anesthesia: General endotracheal anesthesia with preoperative interscalene block given by the attending anesthesiologist    Procedure Detail  OPEN REDUCTION INTERNAL FIXATION (ORIF) PROXIMAL HUMERUS FRACTURE  Findings: Biomet S3 proximal humerus plate. Bone quality poor. Suture augmentation with 4 #2 fiber wires  Estimated Blood Loss:  200 mL         Drains: 1 medium hemovac  Blood Given: none          Specimens: none        Complications:  * No complications entered in OR log *         Disposition: PACU - hemodynamically stable.         Condition: stable    Procedure:   The patient was identified in the preoperative holding area where I personally marked the operative extremity after verifying with the patient and consent. She  was taken to the operating room where She was transferred to the   operative table.  The patient received an interscalene block in   the holding area by the attending anesthesiologist.  General anesthesia was induced   in the operating room without complication.  The  patient did receive IV  Ancef prior to the commencement of the procedure.  The patient was   placed in the beach-chair position with the back raised about 30   degrees.  The nonoperative extremity and head and neck were carefully   positioned and padded protecting against neurovascular compromise.  The   left upper extremity was then prepped and draped in the standard sterile   fashion.    The appropriate operative time-out was performed with   Anesthesia, the perioperative staff, as well as myself and we all agreed   that the right side was the correct operative site.  An approximately   10 cm incision was made from the tip of the coracoid to the center point of the   humerus at the level of the axilla.  Dissection was carried down sharply   through subcutaneous tissues and cephalic vein was identified and taken   laterally with the deltoid.  The pectoralis major was taken medially.  The fracture was identified and the hematoma was debrided. The fracture was controlled with #2 FiberWire sutures in the subscapularis, supraspinatus, and infraspinatus. The posterior tuberosity fragment was controlled and directly with this suture. The shaft was then pulled out of varus using a bone hook and the fracture was held reduced while a plate was laid laterally. The shaft screw was placed distally and then proximally the position of the plate was verified with fluoroscopic imaging. The locking pegs were then sequentially placed proximally and subsequently the additional 2 shaft screws were placed. The  sutures were used to augment fixation through the plate and the posterior suture controlling the posterior fragment was sutured to the anterior subscapularis suture. Final fluoroscopic imaging demonstrated appropriate reduction of the fracture and slight residual valgus with no penetration of the pegs. The wound was copiously irrigated with normal saline subsequent to closed in layers with 2-0 Vicryl in a deep  dermal layer and staples for skin closure. A light sterile dressing was applied. The patient was allowed to awaken from anesthesia transferred to stretcher and taken to recovery room in a sling.  Postoperative plan: She'll be readmitted to the internal medicine service. From an orthopedic standpoint she could likely be discharged tomorrow in a sling. She'll be nonweightbearing.

## 2014-12-12 NOTE — Consult Note (Signed)
Reason for Consult: Right proximal humerus fracture Referring Physician: Tanveer Dobberstein is an 68 y.o. female.  HPI: 68 year old female with lung cancer. Fell late Saturday night and was transferred from [facility].  I treated her left proximal humerus fracture last year. She was found to have a right proximal humerus fracture after most recent fall and also complains of anterior right knee pain.  Past Medical History  Diagnosis Date  . H/O: stroke 2012, 2000,     X3  . Hypertension   . Anxiety   . PTSD (post-traumatic stress disorder)   . Depression   . SVD (spontaneous vaginal delivery)     x 1  . Smoker   . Osteoarthritis     knees, hips hands,   . Shortness of breath   . Pain     SEVERE PAIN BACK, RIGHT HIP AND KNEES--HARRINGTON RODS AND CERVICAL PLATES--USES WALKER OR CANE WHEN AMBULATING - GOES TO A PAIN CLINIC-STATES SHE NEEDS RT HIP AND BOTH KNEES REPLACED. PT STATES SHE WAS TOLD THE CEMENT AROUND THE HARRINGTON RODS IS CRACKED.  [name] Hot flashes, menopausal     SEVERE  . Complication of anesthesia 03/02/13    severe headache,vertigo postop  . COPD (chronic obstructive pulmonary disease)     stable  . 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 Lung cancer   . Status post radiation therapy within four to twelve weeks 10/18/13-12/06/13    lung ca  66Gy  . Family history of anesthesia complication     equipment failure caused problem, suffered a stroke & heartattack    Past Surgical History  Procedure Laterality Date  . Carpal tunnel release      BILATERAL  . Nasal sinus surgery    . Lumbar fusion      L4-S1  . Cervical fusion    . Hernia repair    . Salpingoophorectomy Right   . Vaginal hysterectomy      TAH w/ ovary removal  . Transthoracic echocardiogram  12-03-2011    LVSF NORMAL/ EF 60-65%  . Vulvectomy N/A 03/02/2013    Procedure: WIDE LOCAL EXCISION VULVAR;  Surgeon: Imagene Gurney A. Alycia Rossetti, MD;  Location: [facility];  Service: Gynecology;  Laterality: N/A;  . Co2 laser  application N/A 3/55/7322    Procedure: LASER APPLICATION OF THE VULVA;  Surgeon: Janie Morning, MD;  Location: [facility];  Service: Gynecology;  Laterality: N/A;  . Video bronchoscopy Bilateral 09/23/2013    Procedure: VIDEO BRONCHOSCOPY WITHOUT FLUORO;  Surgeon: Tanda Rockers, MD;  Location: [facility];  Service: Cardiopulmonary;  Laterality: Bilateral;  . Hemiarthroplasty shoulder fracture Left 05/25/2014    DR Ashlynd Michna  . Reverse shoulder arthroplasty Left 05/24/2014    Procedure:   Hemi-Arthroplasty  Left Shoulder;  Surgeon: Nita Sells, MD;  Location: [facility];  Service: Orthopedics;  Laterality: Left;    Family History  Problem Relation Age of Onset  . Cancer Mother     COLON  . Heart disease Father   . Diabetes Maternal Aunt   . Cancer Maternal Grandfather     KIDNEY   . Diabetes Maternal Grandfather   . Hypertension Maternal Grandfather   . Heart disease Maternal Grandfather     Social History:  reports that she has been smoking Cigarettes.  She has a 17.5 pack-year smoking history. She has never used smokeless tobacco. She reports that she does not drink alcohol or use illicit drugs.  Allergies:  Allergies  Allergen Reactions  . Carafate [Sucralfate] Nausea Only  . Prednisone     insomnia  . Nsaids     GI Upset  . Aspirin Other (See Comments)    Gi symptoms. Does NOT take ibuprofen or other NSAIDS  . Ibuprofen     Gi upset  . Dulera [Mometasone Furo-Formoterol Fum] Palpitations    Panting " my body was twisted inside and out"    Medications: I have reviewed the patient's current medications.  Results for orders placed or performed during the hospital encounter of 12/10/14 (from the past 48 hour(s))  Comprehensive metabolic panel     Status: Abnormal   Collection Time: 12/11/14 12:54 AM  Result Value Ref Range   Sodium 132 (L) 137 - 147 mEq/L   Potassium 4.2 3.7 - 5.3 mEq/L   Chloride 94 (L) 96 - 112 mEq/L   CO2 28 19 - 32 mEq/L    Glucose, Bld 276 (H) 70 - 99 mg/dL   BUN 7 6 - 23 mg/dL   Creatinine, Ser 0.71 0.50 - 1.10 mg/dL   Calcium 8.6 8.4 - 10.5 mg/dL   Total Protein 7.2 6.0 - 8.3 g/dL   Albumin 3.6 3.5 - 5.2 g/dL   AST 14 0 - 37 U/L   ALT 10 0 - 35 U/L   Alkaline Phosphatase 131 (H) 39 - 117 U/L   Total Bilirubin <0.2 (L) 0.3 - 1.2 mg/dL   GFR calc non Af Amer 87 (L) >90 mL/min   GFR calc Af Amer >90 >90 mL/min    Comment: (NOTE) The eGFR has been calculated using the CKD EPI equation. This calculation has not been validated in all clinical situations. eGFR's persistently <90 mL/min signify possible Chronic Kidney Disease.    Anion gap 10 5 - 15  CBC with Differential     Status: Abnormal   Collection Time: 12/11/14 12:54 AM  Result Value Ref Range   WBC 9.0 4.0 - 10.5 K/uL   RBC 3.65 (L) 3.87 - 5.11 MIL/uL   Hemoglobin 11.9 (L) 12.0 - 15.0 g/dL   HCT 35.3 (L) 36.0 - 46.0 %   MCV 96.7 78.0 - 100.0 fL   MCH 32.6 26.0 - 34.0 pg   MCHC 33.7 30.0 - 36.0 g/dL   RDW 14.5 11.5 - 15.5 %   Platelets 158 150 - 400 K/uL   Neutrophils Relative % 83 (H) 43 - 77 %   Neutro Abs 7.5 1.7 - 7.7 K/uL   Lymphocytes Relative 10 (L) 12 - 46 %   Lymphs Abs 0.9 0.7 - 4.0 K/uL   Monocytes Relative 6 3 - 12 %   Monocytes Absolute 0.5 0.1 - 1.0 K/uL   Eosinophils Relative 1 0 - 5 %   Eosinophils Absolute 0.1 0.0 - 0.7 K/uL   Basophils Relative 0 0 - 1 %   Basophils Absolute 0.0 0.0 - 0.1 K/uL  Protime-INR     Status: None   Collection Time: 12/11/14 12:54 AM  Result Value Ref Range   Prothrombin Time 12.6 11.6 - 15.2 seconds   INR 0.94 0.00 - 4.16  Basic metabolic panel     Status: Abnormal   Collection Time: 12/11/14  6:00 AM  Result Value Ref Range   Sodium 130 (L) 137 - 147 mEq/L   Potassium 4.4 3.7 - 5.3 mEq/L   Chloride 94 (L) 96 - 112 mEq/L   CO2 25 19 - 32 mEq/L   Glucose, Bld 268 (H) 70 - 99  mg/dL   BUN 6 6 - 23 mg/dL   Creatinine, Ser 0.51 0.50 - 1.10 mg/dL   Calcium 8.4 8.4 - 10.5 mg/dL   GFR calc  non Af Amer >90 >90 mL/min   GFR calc Af Amer >90 >90 mL/min    Comment: (NOTE) The eGFR has been calculated using the CKD EPI equation. This calculation has not been validated in all clinical situations. eGFR's persistently <90 mL/min signify possible Chronic Kidney Disease.    Anion gap 11 5 - 15  CBC     Status: Abnormal   Collection Time: 12/11/14  6:00 AM  Result Value Ref Range   WBC 6.5 4.0 - 10.5 K/uL   RBC 3.40 (L) 3.87 - 5.11 MIL/uL   Hemoglobin 10.7 (L) 12.0 - 15.0 g/dL   HCT 32.5 (L) 36.0 - 46.0 %   MCV 95.6 78.0 - 100.0 fL   MCH 31.5 26.0 - 34.0 pg   MCHC 32.9 30.0 - 36.0 g/dL   RDW 14.4 11.5 - 15.5 %   Platelets 144 (L) 150 - 400 K/uL  MRSA PCR Screening     Status: None   Collection Time: 12/11/14  9:27 PM  Result Value Ref Range   MRSA by PCR NEGATIVE NEGATIVE    Comment:        The GeneXpert MRSA Assay (FDA approved for NASAL specimens only), is one component of a comprehensive MRSA colonization surveillance program. It is not intended to diagnose MRSA infection nor to guide or monitor treatment for MRSA infections.     Dg Chest 1 View  12/10/2014   CLINICAL DATA:  Fall today on to hard surface, shoulder pain, history of metastatic lung cancer.  EXAM: CHEST - 1 VIEW  COMPARISON:  Chest radiograph November 15, 2014  FINDINGS: Limited assessment as patient is rotated to the RIGHT, RIGHT breast attenuation. Cardiomediastinal silhouette grossly normal though, rotation accentuates the RIGHT hilum. Calcified aortic knob. No pleural effusions or focal consolidation. No pneumothorax.  Status post ACDF. LEFT humeral arthroplasty. Comminuted RIGHT humeral fracture dislocation better seen on radiograph from same day, reported separately.  IMPRESSION: No definite acute cardiopulmonary process with a aforementioned limitations.   Electronically Signed   By: Elon Alas   On: 12/10/2014 22:58   Dg Shoulder Right  12/10/2014   CLINICAL DATA:  Fall, landed on a hard  surface, shoulder pain. History of metastatic lung cancer.  EXAM: RIGHT SHOULDER - 2+ VIEW  COMPARISON:  None.  FINDINGS: Comminuted RIGHT humerus surgical neck fracture with medial deviation distal body fragments, impaction. At least 4 fracture fragments identified. Widened subacromial joint space. 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 No destructive bony lesions. Soft tissue swelling without subcutaneous gas or radiopaque foreign bodies.  IMPRESSION: RIGHT humerus surgical neck fracture dislocation appears posttraumatic.   Electronically Signed   By: Elon Alas   On: 12/10/2014 22:56   Ct Shoulder Right Wo Contrast  12/11/2014   CLINICAL DATA:  Fall occur today. Tripped and fall injury landing with arm stretched out. Constant pain right shoulder. Chemotherapy yesterday for lung cancer. Humerus fracture on plain films 12/10/2014 for additional evaluation.  EXAM: CT OF THE RIGHT SHOULDER WITHOUT CONTRAST  TECHNIQUE: Multidetector CT imaging was performed according to the standard protocol. Multiplanar CT image reconstructions were also generated.  COMPARISON:  Right shoulder radiographs 12/10/2014. CT chest 11/21/2014.  FINDINGS: Comminuted fractures involving the right proximal humerus. Transverse fracture through the humeral neck with complete medial displacement of the distal fracture  fragment inferior and medial to the inferior glenoid. The humeral head fracture is displaced inferiorly with complete glenohumeral dislocation. Comminuted fractures of the humeral head with displaced greater tuberosity fragments. Additional displaced fragments demonstrated in the region of the fracture. Acromioclavicular joint demonstrates degenerative changes without dislocation. Glenoid appears intact. Associated effusion.  IMPRESSION: Comminuted and displaced fractures of the proximal humeral head and neck with glenohumeral dislocation.   Electronically Signed   By: Lucienne Capers M.D.   On:  12/11/2014 01:15   Dg Chest Port 1 View  12/12/2014   CLINICAL DATA:  Fever, bronchitis, history of lung cancer, fall with right shoulder fracture  EXAM: PORTABLE CHEST - 1 VIEW  COMPARISON:  A CT right shoulder dated 12/11/2014. Chest radiograph dated 12/10/2014. CT chest dated 11/21/2014.  FINDINGS: Mild patchy right middle lobe opacity, likely reflecting paramediastinal radiation changes when correlating with prior CT.  Mild patchy left basilar opacity, possibly radiation changes, pneumonia not excluded.  No pleural effusion or pneumothorax.  The heart is normal in size.  Comminuted, displaced right proximal humeral fracture, better evaluated on prior CT. Left shoulder arthroplasty. Cervical spine fixation hardware.  IMPRESSION: Mild patchy left basilar opacity, possibly radiation changes, marrow excluded.  Mild patchy right middle lobe opacity, likely reflecting paramediastinal radiation changes.  Comminuted, displaced right proximal humeral fracture, better evaluated on CT.   Electronically Signed   By: Julian Hy M.D.   On: 12/12/2014 07:20    Review of Systems  Respiratory: Positive for cough.   All other systems reviewed and are negative.  Blood pressure 117/60, pulse 97, temperature 100.9 F (38.3 C), temperature source Oral, resp. rate 18, height 5' 6"  (1.676 m), weight 74.39 kg (164 lb), SpO2 94 %. Physical Exam  Constitutional: She is oriented to person, place, and time. She appears well-developed.  HENT:  Head: Atraumatic.  Eyes: EOM are normal.  Cardiovascular: Intact distal pulses.   Musculoskeletal:  Right shoulder swollen and tender to palpation. Pain with any attempted range of motion. Distally neurovascularly intact. No significant tenderness about the elbow or wrist. Right lower extremity shows mild tenderness over the patella but no significant swelling. Skin is intact.  Neurological: She is alert and oriented to person, place, and time.  Skin: Skin is warm and dry.   Psychiatric: She has a normal mood and affect.    Assessment/Plan: Right displaced three-part proximal humerus fracture Plan ORIF right proximal humerus fracture versus arthroplasty Nothing by mouth for surgery today She would benefit from tighter blood glucose control perioperatively. X-ray right knee  Nita Sells 12/12/2014, 8:05 AM

## 2014-12-12 NOTE — Anesthesia Postprocedure Evaluation (Signed)
  Anesthesia Post-op Note  Patient: [name]  Procedure(s) Performed: Procedure(s): OPEN REDUCTION INTERNAL FIXATION (ORIF) PROXIMAL HUMERUS FRACTURE (Right)  Patient Location: PACU  Anesthesia Type:General and GA combined with regional for post-op pain  Level of Consciousness: awake, alert  and oriented  Airway and Oxygen Therapy: Patient Spontanous Breathing  Post-op Pain: none  Post-op Assessment: Post-op Vital signs reviewed, Patient's Cardiovascular Status Stable, Respiratory Function Stable, Patent Airway and Pain level controlled  Post-op Vital Signs: stable  Last Vitals:  Filed Vitals:   12/12/14 1845  BP: 126/56  Pulse: 74  Temp: 36.8 C  Resp: 13    Complications: No apparent anesthesia complications

## 2014-12-12 NOTE — Interval H&P Note (Signed)
History and Physical Interval Note:  12/12/2014 2:13 PM  [name]  has presented today for surgery, with the diagnosis of right proximal humerus fracture  The various methods of treatment have been discussed with the patient and family. After consideration of risks, benefits and other options for treatment, the patient has consented to  Procedure(s): OPEN REDUCTION INTERNAL FIXATION (ORIF) PROXIMAL HUMERUS FRACTURE (Right) as a surgical intervention .  The patient's history has been reviewed, patient examined, no change in status, stable for surgery.  I have reviewed the patient's chart and labs.  Questions were answered to the patient's satisfaction.     Nita Sells

## 2014-12-13 ENCOUNTER — Encounter (HOSPITAL_COMMUNITY): Payer: Self-pay | Admitting: Orthopedic Surgery

## 2014-12-13 LAB — CBC
HCT: 26.2 % — ABNORMAL LOW (ref 36.0–46.0)
Hemoglobin: 8.5 g/dL — ABNORMAL LOW (ref 12.0–15.0)
MCH: 31.6 pg (ref 26.0–34.0)
MCHC: 32.4 g/dL (ref 30.0–36.0)
MCV: 97.4 fL (ref 78.0–100.0)
Platelets: 159 10*3/uL (ref 150–400)
RBC: 2.69 MIL/uL — AB (ref 3.87–5.11)
RDW: 14.4 % (ref 11.5–15.5)
WBC: 6.5 10*3/uL (ref 4.0–10.5)

## 2014-12-13 MED ORDER — OXYCODONE-ACETAMINOPHEN 7.5-325 MG PO TABS: 1.0000 | ORAL_TABLET | ORAL | Status: DC | PRN

## 2014-12-13 MED ORDER — SERTRALINE HCL 100 MG PO TABS
200.0000 mg | ORAL_TABLET | Freq: Every day | ORAL | Status: DC
  Administered 2014-12-14 – 2014-12-15 (×2): 200 mg via ORAL
  Filled 2014-12-13 (×3): qty 2

## 2014-12-13 MED ORDER — SERTRALINE HCL 100 MG PO TABS: 200.0000 mg | ORAL_TABLET | Freq: Every day | ORAL | Status: DC

## 2014-12-13 MED ORDER — OXYCODONE-ACETAMINOPHEN 5-325 MG PO TABS: 1.0000 | ORAL_TABLET | ORAL | Status: DC | PRN

## 2014-12-13 MED ORDER — NICOTINE 7 MG/24HR TD PT24
7.0000 mg | MEDICATED_PATCH | Freq: Every day | TRANSDERMAL | Status: DC
Start: 2014-12-13 — End: 2014-12-15
  Administered 2014-12-13 – 2014-12-14 (×2): 7 mg via TRANSDERMAL
  Filled 2014-12-13 (×3): qty 1

## 2014-12-13 MED ORDER — SERTRALINE HCL 100 MG PO TABS
200.0000 mg | ORAL_TABLET | Freq: Every day | ORAL | Status: DC
  Filled 2014-12-13: qty 2

## 2014-12-13 MED ORDER — SERTRALINE HCL 100 MG PO TABS
100.0000 mg | ORAL_TABLET | Freq: Every day | ORAL | Status: AC
  Administered 2014-12-13: 100 mg via ORAL
  Filled 2014-12-13: qty 1

## 2014-12-13 NOTE — Discharge Instructions (Signed)
Discharge Instructions after Open Shoulder Repair  A sling has been provided for you. Remain in your sling at all times. This includes sleeping in your sling.  Use ice on the shoulder intermittently over the first 48 hours after surgery.  Pain medicine has been prescribed for you.  Use your medicine liberally over the first 48 hours, and then you can begin to taper your use. You may take Extra Strength Tylenol or Tylenol only in place of the pain pills. DO NOT take ANY nonsteroidal anti-inflammatory pain medications: Advil, Motrin, Ibuprofen, Aleve, Naproxen or Naprosyn.  Leave dressing on until your first visit with Dr. Tamera Punt. It is waterproof and okay to shower with. Simply allow the water to wash over the dressing and then pat dry.  Make sure your axilla (armpit) is completely dry after showering. Keep your arm by your side in the shower like it is in a sling. Take one aspirin, a day for 2 weeks after surgery, unless you have an aspirin sensitivity/ allergy or asthma.   Please call [phone] during normal business hours or [phone] after hours for any problems. Including the following:  - excessive redness of the incisions - drainage for more than 4 days - fever of more than 101.5 F  *Please note that pain medications will not be refilled after hours or on weekends.

## 2014-12-13 NOTE — Clinical Documentation Improvement (Signed)
Abnormal Lab Results: Sodium: 12/13: 130.  Treatment provided: 12/14: 0.9% NaCl @ 100 ml/hr    Possible Clinical Conditions? > Hyponatremia > Other > Not able to determine    Thank Sherian Maroon Documentation Specialist [phone] [email or web address]

## 2014-12-13 NOTE — Progress Notes (Signed)
[facility] PROGRESS NOTE  [name] FAO:[id number] DOB: [birth year] DOA: 12/10/2014 PCP: No PCP Per Patient  Brief narrative: Patient is a 68 year old with history of lung cancer on chemotherapy, PTSD, hypertension, COPD who presented after mechanical fall. Patient presented with humerus fracture or orthopedic surgery consulted.  Assessment/Plan: Principal Problem:   Fracture, humerus closed - Ortho on board and assisting - Pain control - Had fever, will continue to monitor and if fever recurs we'll plan on working up further  Active Problems:   COPD GOLD III - stable continue albuterol  Anemia - In context of recent operation. Has dropped from 11.9 to 8.5. - Given drop in hemoglobin which may represent combination of recent operation and IV fluids - Will reassess next AM    Essential hypertension, benign - Pt on atenolol with last pressure recorded at 111/65    Chronic pain - Pain control, please see order list    Lung cancer - Will continue patient's home regimen   Code Status: full Family Communication:  Disposition Plan:    Consultants:  The orthopedic surgery  Procedures:  Pending  Antibiotics:  None  HPI/Subjective: Patient has no new complaints  Objective: Filed Vitals:   12/13/14 1110  BP: 106/51  Pulse: 86  Temp: 98.2 F (36.8 C)  Resp: 20    Intake/Output Summary (Last 24 hours) at 12/13/14 1610 Last data filed at 12/13/14 1300  Gross per 24 hour  Intake 2961.67 ml  Output    100 ml  Net 2861.67 ml   Filed Weights   12/10/14 2147  Weight: 74.39 kg (164 lb)    Exam:  General: Alert, awake, in no acute distress. HEENT: No bruits, no goiter. Heart: Regular rate and rhythm, without murmurs, rubs, gallops. Lungs: Clear to auscultation bilaterally, no increased wob Abdomen: Soft, nontender, nondistended, positive bowel sounds. Neuro: No facial asymmetry, answers questions appropriately  Data Reviewed: Basic  Metabolic Panel:  Recent Labs Lab 12/09/14 1100 12/11/14 0054 12/11/14 0600  NA 134* 132* 130*  K 4.2 4.2 4.4  CL  --  94* 94*  CO2 27 28 25   GLUCOSE 259* 276* 268*  BUN 8.8 7 6   CREATININE 0.9 0.71 0.51  CALCIUM 8.4 8.6 8.4   Liver Function Tests:  Recent Labs Lab 12/09/14 1100 12/11/14 0054  AST 15 14  ALT 12 10  ALKPHOS 138 131*  BILITOT 0.52 <0.2*  PROT 7.1 7.2  ALBUMIN 3.7 3.6   No results for input(s): LIPASE, AMYLASE in the last 168 hours. No results for input(s): AMMONIA in the last 168 hours. CBC:  Recent Labs Lab 12/09/14 1100 12/11/14 0054 12/11/14 0600 12/13/14 0838  WBC 5.1 9.0 6.5 6.5  NEUTROABS 3.7 7.5  --   --   HGB 13.1 11.9* 10.7* 8.5*  HCT 40.7 35.3* 32.5* 26.2*  MCV 98.9 96.7 95.6 97.4  PLT 174 158 144* 159   Cardiac Enzymes: No results for input(s): CKTOTAL, CKMB, CKMBINDEX, TROPONINI in the last 168 hours. BNP (last 3 results) No results for input(s): PROBNP in the last 8760 hours. CBG: No results for input(s): GLUCAP in the last 168 hours.  Recent Results (from the past 240 hour(s))  MRSA PCR Screening     Status: None   Collection Time: 12/11/14  9:27 PM  Result Value Ref Range Status   MRSA by PCR NEGATIVE NEGATIVE Final    Comment:        The GeneXpert MRSA Assay (FDA approved for NASAL specimens only), is  one component of a comprehensive MRSA colonization surveillance program. It is not intended to diagnose MRSA infection nor to guide or monitor treatment for MRSA infections.   Culture, blood (routine x 2)     Status: None (Preliminary result)   Collection Time: 12/12/14  8:00 AM  Result Value Ref Range Status   Specimen Description BLOOD LEFT HAND  Final   Special Requests BOTTLES DRAWN AEROBIC ONLY 1.5CC  Final   Culture  Setup Time   Final    12/12/2014 14:15 Performed at [facility]    Culture   Final           BLOOD CULTURE RECEIVED NO GROWTH TO DATE CULTURE WILL BE HELD FOR 5 DAYS BEFORE ISSUING A  FINAL NEGATIVE REPORT Note: Culture results may be compromised due to an inadequate volume of blood received in culture bottles. Performed at [facility]    Report Status PENDING  Incomplete  Culture, blood (routine x 2)     Status: None (Preliminary result)   Collection Time: 12/12/14  8:10 AM  Result Value Ref Range Status   Specimen Description BLOOD RIGHT HAND  Final   Special Requests   Final    BOTTLES DRAWN AEROBIC AND ANAEROBIC 10CC BLUE,5CC RED   Culture  Setup Time   Final    12/12/2014 14:17 Performed at [facility]    Culture   Final           BLOOD CULTURE RECEIVED NO GROWTH TO DATE CULTURE WILL BE HELD FOR 5 DAYS BEFORE ISSUING A FINAL NEGATIVE REPORT Performed at [facility]    Report Status PENDING  Incomplete     Studies: Dg Shoulder Right  12/12/2014   CLINICAL DATA:  ORIF right shoulder.  EXAM: RIGHT SHOULDER - 2+ VIEW  COMPARISON:  12/10/2014  FINDINGS: Initial image demonstrates evidence of patient's moderately displaced humeral head and neck fracture. Subsequent images demonstrate lateral fixation plate with associated screws bridging patient's head/neck fracture as there is anatomic alignment about the fracture site and glenohumeral joint. Hardware is intact.  IMPRESSION: Fixation patient's humeral head and neck fracture with hardware intact and anatomic alignment about the fracture site.   Electronically Signed   By: Marin Olp M.D.   On: 12/12/2014 17:33   Dg Knee 1-2 Views Right  12/12/2014   CLINICAL DATA:  Acute right knee pain, fall Saturday  EXAM: RIGHT KNEE - 1-2 VIEW  COMPARISON:  None.  FINDINGS: Two views of the right knee submitted. Diffuse mild narrowing of joint space. There is chondrocalcinosis. Narrowing of patellofemoral joint space. Spurring of patella. No joint effusion. No acute fracture or subluxation.  IMPRESSION: No acute fracture or subluxation. Osteoarthritic changes as described above. Chondrocalcinosis.    Electronically Signed   By: Lahoma Crocker M.D.   On: 12/12/2014 09:22   Dg Chest Port 1 View  12/12/2014   CLINICAL DATA:  Fever, bronchitis, history of lung cancer, fall with right shoulder fracture  EXAM: PORTABLE CHEST - 1 VIEW  COMPARISON:  A CT right shoulder dated 12/11/2014. Chest radiograph dated 12/10/2014. CT chest dated 11/21/2014.  FINDINGS: Mild patchy right middle lobe opacity, likely reflecting paramediastinal radiation changes when correlating with prior CT.  Mild patchy left basilar opacity, possibly radiation changes, pneumonia not excluded.  No pleural effusion or pneumothorax.  The heart is normal in size.  Comminuted, displaced right proximal humeral fracture, better evaluated on prior CT. Left shoulder arthroplasty. Cervical spine fixation hardware.  IMPRESSION: Mild patchy left basilar opacity, possibly radiation changes, marrow excluded.  Mild patchy right middle lobe opacity, likely reflecting paramediastinal radiation changes.  Comminuted, displaced right proximal humeral fracture, better evaluated on CT.   Electronically Signed   By: Julian Hy M.D.   On: 12/12/2014 07:20   Dg C-arm 61-120 Min-no Report  12/12/2014   CLINICAL DATA: ORIF proximal humerus, right   C-ARM 61-120 MINUTES  Fluoroscopy was utilized by the requesting physician.  No radiographic  interpretation.     Scheduled Meds: . albuterol  2.5 mg Nebulization Q6H  . aspirin EC  325 mg Oral BID  . atenolol  50 mg Oral Daily  . docusate sodium  100 mg Oral BID  . midazolam  2 mg Intravenous Once  . sertraline  100 mg Oral Daily  . tiotropium  18 mcg Inhalation Daily   Continuous Infusions: . sodium chloride 100 mL/hr at 12/12/14 2300  . lactated ringers 50 mL/hr at 12/12/14 2200    Time spent: > 35 minutes    Velvet Bathe  [facility] Pager [phone] If 7PM-7AM, please contact night-coverage at [email or web address], password [facility] 12/13/2014, 4:10 PM  LOS: 3 days

## 2014-12-13 NOTE — Progress Notes (Signed)
Occupational Therapy Evaluation Patient Details Name: [name] MRN: [id number] DOB: [birth year] Today's Date: 12/13/2014    History of Present Illness Pt s/p fall at home resulting in R proximal humerus fracture. Pt underwent ORIF R UE. Pt recently fell in July and underwent a reverse TSA LUE. Pt with Lung Ca and is undergoing treatment.   Clinical Impression   PTA, pt lived alone and had neighbors help with cooking and cleaning intermittently. Per friend, pt with history of multiple falls. Pt just fell this am in room. Pt currently requires mod A with ADL and mod A at times with mobility and is extremely high fall risk. Uses RW at baseline. Educated pt on compensatory techniques for ADL. Pt UNABLE to return demonstrate. Pt not following precautions regarding not using R shoulder, regardless of multiple vc. Pt attempting to push weight through RUE during transfers. Pt with apparent cognitive impairments at baseline and is not safe to D/C home alone at this time. Pt would need 24/7 physical assistance at all times to reduce risk of falls.  Discussed D/C concerns with pt, who states "she will make it work". Pt not in agreement with SNF at this time. [name] friend states that pt does not have 24/7 S available. Discussed D/C concerns with nsg. Also attempted to call SW Raquel Sarna) with concerns.    Follow Up Recommendations  SNF;Supervision/Assistance - 24 hour    Equipment Recommendations  3 in 1 bedside comode    Recommendations for Other Services PT consult     Precautions / Restrictions Precautions Precautions: Fall;Shoulder Type of Shoulder Precautions: no shoulder ROM Shoulder Interventions: Shoulder sling/immobilizer;At all times;Off for dressing/bathing/exercises Precaution Booklet Issued: Yes (comment) Required Braces or Orthoses: Sling Restrictions Weight Bearing Restrictions: Yes RUE Weight Bearing: Non weight bearing      Mobility Bed Mobility Overal bed mobility: Needs  Assistance Bed Mobility: Supine to Sit;Sit to Supine     Supine to sit: Min assist Sit to supine: Min assist   General bed mobility comments: physical cues to not use RUE  Transfers Overall transfer level: Needs assistance Equipment used: 1 person hand held assist Transfers: Sit to/from [facility] Sit to Stand: Min assist Stand pivot transfers: Min assist       General transfer comment: unsteady. vc to not use RUE to push up    Balance Overall balance assessment: Needs assistance   Sitting balance-Leahy Scale: Fair       Standing balance-Leahy Scale: Poor                              ADL Overall ADL's : Needs assistance/impaired     Grooming: Moderate assistance;Sitting   Upper Body Bathing: Moderate assistance;Sitting   Lower Body Bathing: Moderate assistance;Sit to/from stand   Upper Body Dressing : Moderate assistance;Sitting   Lower Body Dressing: Moderate assistance;Sit to/from stand   Toilet Transfer: Ambulation;Comfort height toilet;Minimal assistance   Toileting- Clothing Manipulation and Hygiene: Minimal assistance;Sit to/from stand Toileting - Clothing Manipulation Details (indicate cue type and reason): Pt leaning into wall on RUE     Functional mobility during ADLs: Moderate assistance;Cueing for safety;Cueing for sequencing (required physical A to prevent pt falling into wall) General ADL Comments: Pt requiring increased assistance with ADL adn mobility than her basleine. Began educating pt on dressing techniques using ! Hand. Pt unable to return demonstrate with several trials. Unsafe to be alone at home.     Vision  Perception     Praxis      Pertinent Vitals/Pain Pain Assessment: Faces Faces Pain Scale: Hurts a little bit Pain Location: RUE Pain Descriptors / Indicators: Guarding Pain Intervention(s): Monitored during session;Repositioned     Hand Dominance Right    Extremity/Trunk Assessment Upper Extremity Assessment Upper Extremity Assessment: RUE deficits/detail RUE Deficits / Details: elbow/wrist/hand ROM WFL. no shoulder movement allowed RUE: Unable to fully assess due to immobilization RUE Coordination: decreased gross motor   Lower Extremity Assessment Lower Extremity Assessment: Generalized weakness;Defer to PT evaluation   Cervical / Trunk Assessment Cervical / Trunk Assessment: Kyphotic   Communication     Cognition Arousal/Alertness: Awake/alert Behavior During Therapy: Restless;Anxious;Impulsive Overall Cognitive Status: History of cognitive impairments - at baseline       Memory: Decreased short-term memory;Decreased recall of precautions             General Comments       Exercises Exercises: Other exercises Other Exercises Other Exercises: R elbow/wrist/hand A/AAROM Other Exercises: educated on positioning of sling - unable to return demonstrate   Shoulder Instructions      Home Living Family/patient expects to be discharged to:: Private residence Living Arrangements: Alone Available Help at Discharge: Other (Comment) (unsure) Type of Home: Apartment Home Access: Elevator     Home Layout: One level     Bathroom Shower/Tub: Occupational psychologist: Standard Bathroom Accessibility: Yes How Accessible: Accessible via walker Home Equipment: [place] - 2 wheels;Cane - quad;Shower seat;Wheelchair - manual;Grab bars - tub/shower (emergency call bells in bathroom and bedroom)   Additional Comments: States "sherrie" can provide 24/7 S. unable to confirm with Sherrie, who does not drive      Prior Functioning/Environment Level of Independence: Needs assistance  Gait / Transfers Assistance Needed:  RW ADL's / Homemaking Assistance Needed: neighbor helps with meals and housekeeping and appointment. Per neighbor, son is not reliable.   Comments: Pt is responsible for her self care, cooking etc when she  does not have help    OT Diagnosis: Generalized weakness;Acute pain;Cognitive deficits   OT Problem List: Decreased strength;Decreased range of motion;Decreased activity tolerance;Impaired balance (sitting and/or standing);Decreased coordination;Decreased cognition;Decreased safety awareness;Decreased knowledge of precautions;Obesity;Pain;Impaired UE functional use;Cardiopulmonary status limiting activity   OT Treatment/Interventions: Self-care/ADL training;Therapeutic exercise;DME and/or AE instruction;Therapeutic activities;Cognitive remediation/compensation;Patient/family education;Balance training    OT Goals(Current goals can be found in the care plan section) Acute Rehab OT Goals Patient Stated Goal: to go home OT Goal Formulation: Patient unable to participate in goal setting Time For Goal Achievement: 12/27/14 Potential to Achieve Goals: Fair  OT Frequency: Min 3X/week   Barriers to D/C: Decreased caregiver support  Pt needs 24/7 physical assistance       Co-evaluation              End of Session Equipment Utilized During Treatment: Gait belt Nurse Communication: Mobility status;Precautions;Weight bearing status;Other (comment) (concerns over D/C alone)  Activity Tolerance: Patient tolerated treatment well Patient left: in bed;with call bell/phone within reach;with bed alarm set;with family/visitor present   Time: 5102-5852 OT Time Calculation (min): 45 min Charges:  OT General Charges $OT Visit: 1 Procedure OT Evaluation $Initial OT Evaluation Tier I: 1 Procedure OT Treatments $Self Care/Home Management : 23-37 mins $Therapeutic Activity: 8-22 mins G-Codes:    Wilene Pharo,HILLARY 12-23-14, 10:59 AM   Maurie Boettcher, OTR/L  [phone] 12-23-2014

## 2014-12-13 NOTE — Progress Notes (Signed)
   PATIENT ID: [name]   1 Day Post-Op Procedure(s) (LRB): OPEN REDUCTION INTERNAL FIXATION (ORIF) PROXIMAL HUMERUS FRACTURE (Right)  Subjective: Pain in right shoulder, working on a management po pain rx. Wishes to be on Percocet 5/325 as home med. No other complaints or concerns.   Objective:  Filed Vitals:   12/13/14 0541  BP: 111/59  Pulse: 96  Temp: 100.7 F (38.2 C)  Resp:      AWake, alert, orientated R UE dressing c/d/i Wiggles fingers, distally NVI In sling  Labs:   Recent Labs  12/11/14 0054 12/11/14 0600  HGB 11.9* 10.7*   Recent Labs  12/11/14 0054 12/11/14 0600  WBC 9.0 6.5  RBC 3.65* 3.40*  HCT 35.3* 32.5*  PLT 158 144*   Recent Labs  12/11/14 0054 12/11/14 0600  NA 132* 130*  K 4.2 4.4  CL 94* 94*  CO2 28 25  BUN 7 6  CREATININE 0.71 0.51  GLUCOSE 276* 268*  CALCIUM 8.6 8.4    Assessment and Plan: 1 day s/p right shoulder ORIF prox humerus fracture Discussed weening off IV pain rx. Percocet for pain control Script for percocet in chart for home pain rx Labs not back yet Okay to d/c home today from ortho standpoint after OT, d/c per primary team  appreciate hospitalist care for this patient  VTE proph: ASA325mg  BID, SCDs

## 2014-12-13 NOTE — Evaluation (Signed)
Physical Therapy Evaluation Patient Details Name: [name] MRN: [id number] DOB: [birth year] Today's Date: 12/13/2014   History of Present Illness  Pt s/p fall at home resulting in R proximal humerus fracture. Pt underwent ORIF R UE. Pt recently feel in July and underwent a reverse TSA LUE. Pt with Lung Ca and is undergoing treatment.  Clinical Impression  Patient is s/p above surgery resulting in functional limitations due to the deficits listed below (see PT Problem List).  Patient will benefit from skilled PT to increase their independence and safety with mobility to allow discharge to the venue listed below.    Pt needs 24 hour assist to be able to safely be at home; at her current functional level, dc home is not safe and sustainable; Recommend SNF for rehab to maximize independence and safety with mobility prior to dc home.     Follow Up Recommendations SNF;Supervision/Assistance - 24 hour    Equipment Recommendations  Other (comment) (to be determined; considering hemiwalker)    Recommendations for Other Services       Precautions / Restrictions Precautions Precautions: Fall;Shoulder Type of Shoulder Precautions: no shoulder ROM Shoulder Interventions: Shoulder sling/immobilizer;At all times;Off for dressing/bathing/exercises Precaution Booklet Issued: Yes (comment) Required Braces or Orthoses: Sling Restrictions Weight Bearing Restrictions: Yes RUE Weight Bearing: Non weight bearing      Mobility  Bed Mobility Overal bed mobility: Needs Assistance Bed Mobility: Supine to Sit;Sit to Supine     Supine to sit: Min assist Sit to supine: Min guard   General bed mobility comments: physical cues to not use RUE  Transfers Overall transfer level: Needs assistance Equipment used: 1 person hand held assist Transfers: Sit to/from Stand Sit to Stand: Min assist Stand pivot transfers: Min assist       General transfer comment: unsteady. vc to not use RUE to push  up  Ambulation/Gait Ambulation/Gait assistance: Min assist Ambulation Distance (Feet): 2 Feet (sidesteps to [facility]) Assistive device: Quad cane (small base) Gait Pattern/deviations: Shuffle     General Gait Details: trunk flexed; declined furhter amb due to pain  Stairs            Wheelchair Mobility    Modified Rankin (Stroke Patients Only)       Balance Overall balance assessment: Needs assistance   Sitting balance-Leahy Scale: Fair       Standing balance-Leahy Scale: Poor                               Pertinent Vitals/Pain Pain Assessment: 0-10 Pain Score: 8  Faces Pain Scale: Hurts a little bit Pain Location: RUE Pain Descriptors / Indicators: Grimacing Pain Intervention(s): Limited activity within patient's tolerance;Patient requesting pain meds-RN notified    Home Living Family/patient expects to be discharged to:: Private residence Living Arrangements: Alone Available Help at Discharge: Other (Comment) (unsure) Type of Home: Apartment Home Access: Elevator     Home Layout: One level Home Equipment: [place] - 2 wheels;Cane - quad;Shower seat;Wheelchair - manual;Grab bars - tub/shower (emergency call bells in bathroom and bedroom) Additional Comments: States "sherrie" can provide 24/7 S. unable to confirm with Sherrie, who does not drive (per OT)    Prior Function Level of Independence: Needs assistance   Gait / Transfers Assistance Needed:  RW  ADL's / Homemaking Assistance Needed: neighbor helps with meals and housekeeping and appointment. Per neighbor, son is not reliable.  Comments: Pt is responsible for her self care,  cooking etc when she does not have help     Hand Dominance   Dominant Hand: Right    Extremity/Trunk Assessment   Upper Extremity Assessment: Defer to OT evaluation RUE Deficits / Details: elbow/wrist/hand ROM WFL. no shoulder movement allowed RUE: Unable to fully assess due to pain;Unable to fully assess due to  immobilization       Lower Extremity Assessment: Generalized weakness      Cervical / Trunk Assessment: Kyphotic  Communication   Communication: No difficulties  Cognition Arousal/Alertness: Awake/alert Behavior During Therapy: Restless;Anxious;Impulsive Overall Cognitive Status: History of cognitive impairments - at baseline       Memory: Decreased short-term memory;Decreased recall of precautions              General Comments      Exercises Other Exercises Other Exercises: R elbow/wrist/hand A/AAROM Other Exercises: educated on positioning of sling - unable to return demonstrate      Assessment/Plan    PT Assessment Patient needs continued PT services  PT Diagnosis Difficulty walking;Generalized weakness;Acute pain   PT Problem List Decreased strength;Decreased range of motion;Decreased activity tolerance;Decreased balance;Decreased mobility;Decreased knowledge of use of DME;Decreased safety awareness;Decreased knowledge of precautions;Decreased cognition;Pain  PT Treatment Interventions DME instruction;Gait training;Functional mobility training;Therapeutic activities;Therapeutic exercise;Balance training;Neuromuscular re-education;Cognitive remediation;Patient/family education   PT Goals (Current goals can be found in the Care Plan section) Acute Rehab PT Goals Patient Stated Goal: to go home PT Goal Formulation: Patient unable to participate in goal setting Time For Goal Achievement: 12/27/14 Potential to Achieve Goals: Good    Frequency Min 3X/week   Barriers to discharge Decreased caregiver support Per OT and nursing, pt does not have 24 hour assist avialable to her; She must have 24 hour assist    Co-evaluation               End of Session Equipment Utilized During Treatment: Gait belt Activity Tolerance: Patient limited by pain;Treatment limited secondary to agitation Patient left: in bed;with bed alarm set;with call bell/phone within  reach Nurse Communication: Mobility status;Other (comment) (and cigarettes and lighter in her bed)         Time: 1119-1138 (minus 5 minutes to get cane) PT Time Calculation (min) (ACUTE ONLY): 19 min   Charges:   PT Evaluation $Initial PT Evaluation Tier I: 1 Procedure PT Treatments $Therapeutic Activity: 8-22 mins   PT G Codes:          Roney Marion Hamff 12/13/2014, 12:01 PM  Roney Marion, [place]  Acute Rehabilitation Services Pager [phone] Office [phone]

## 2014-12-14 ENCOUNTER — Inpatient Hospital Stay (HOSPITAL_COMMUNITY): Payer: Medicare Other

## 2014-12-14 DIAGNOSIS — C3490 Malignant neoplasm of unspecified part of unspecified bronchus or lung: Secondary | ICD-10-CM

## 2014-12-14 DIAGNOSIS — D62 Acute posthemorrhagic anemia: Secondary | ICD-10-CM

## 2014-12-14 LAB — CBC
HEMATOCRIT: 23.4 % — AB (ref 36.0–46.0)
HEMOGLOBIN: 7.9 g/dL — AB (ref 12.0–15.0)
MCH: 33.3 pg (ref 26.0–34.0)
MCHC: 33.8 g/dL (ref 30.0–36.0)
MCV: 98.7 fL (ref 78.0–100.0)
Platelets: 154 10*3/uL (ref 150–400)
RBC: 2.37 MIL/uL — ABNORMAL LOW (ref 3.87–5.11)
RDW: 14.4 % (ref 11.5–15.5)
WBC: 4.5 10*3/uL (ref 4.0–10.5)

## 2014-12-14 LAB — PREPARE RBC (CROSSMATCH)

## 2014-12-14 MED ORDER — TIOTROPIUM BROMIDE MONOHYDRATE 18 MCG IN CAPS: 18.0000 ug | ORAL_CAPSULE | Freq: Every day | RESPIRATORY_TRACT | Status: DC

## 2014-12-14 MED ORDER — FUROSEMIDE 10 MG/ML IJ SOLN
20.0000 mg | Freq: Once | INTRAMUSCULAR | Status: AC
  Administered 2014-12-14: 20 mg via INTRAVENOUS
  Filled 2014-12-14 (×2): qty 2

## 2014-12-14 MED ORDER — TRAZODONE HCL 150 MG PO TABS
300.0000 mg | ORAL_TABLET | Freq: Every day | ORAL | Status: DC
  Administered 2014-12-14: 300 mg via ORAL
  Filled 2014-12-14 (×3): qty 2

## 2014-12-14 MED ORDER — DSS 100 MG PO CAPS: 100.0000 mg | ORAL_CAPSULE | Freq: Two times a day (BID) | ORAL | Status: DC

## 2014-12-14 MED ORDER — ASPIRIN 325 MG PO TBEC: 325.0000 mg | DELAYED_RELEASE_TABLET | Freq: Two times a day (BID) | ORAL | Status: DC

## 2014-12-14 MED ORDER — SODIUM CHLORIDE 0.9 % IV SOLN: Freq: Once | INTRAVENOUS | Status: DC

## 2014-12-14 NOTE — Plan of Care (Signed)
Problem: Consults Goal: Diagnosis - Shoulder Surgery Outcome: Completed/Met Date Met:  12/14/14 Total Shoulder Arthroplasty Right

## 2014-12-14 NOTE — Clinical Social Work Note (Addendum)
Pt to be discharged to [facility]. Pt updated at bedside regarding discharge.  Facility: [facility] Report number: [id number] Transportation: EMS [place], [phone]) *scheduled 1:30pm pick-up, at RN request*   Lubertha Sayres, Latanya Presser [phone]) Licensed Clinical Social Worker Orthopedics [phone]) and Surgical [phone])

## 2014-12-14 NOTE — Clinical Social Work Placement (Signed)
Clinical Social Work Department CLINICAL SOCIAL WORK PLACEMENT NOTE 12/14/2014  Patient:  [name], [name]  Account Number:  [id number] Admit date:  12/10/2014  Clinical Social Worker:  Delrae Sawyers  Date/time:  12/14/2014 12:24 PM  Clinical Social Work is seeking post-discharge placement for this patient at the following level of care:   SKILLED NURSING   (*CSW will update this form in Epic as items are completed)   12/14/2014  Patient/family provided with [facility] of Clinical Social Work's list of facilities offering this level of care within the geographic area requested by the patient (or if unable, by the patient's family).  12/14/2014  Patient/family informed of their freedom to choose among providers that offer the needed level of care, that participate in Medicare, Medicaid or managed care program needed by the patient, have an available bed and are willing to accept the patient.  12/14/2014  Patient/family informed of [facility]' ownership interest in [facility], as well as of the fact that they are under no obligation to receive care at this facility.  PASARR submitted to EDS on  PASARR number received on   FL2 transmitted to all facilities in geographic area requested by pt/family on  12/14/2014 FL2 transmitted to all facilities within larger geographic area on   Patient informed that his/her managed care company has contracts with or will negotiate with  certain facilities, including the following:     Patient/family informed of bed offers received:  12/14/2014 Patient chooses bed at [place] Physician recommends and patient chooses bed at    Patient to be transferred to [facility] on  12/14/2014 Patient to be transferred to facility by [facility] Patient and family notified of transfer on 12/14/2014 Name of family member notified:  Pt updated at bedside. Pt alert and oriented.  The following physician request were  entered in Epic:   Additional Comments: PASARR previously existing.  Lubertha Sayres, [place] ([phone]) Licensed Clinical Social Worker Orthopedics [phone]) and Surgical [phone])

## 2014-12-14 NOTE — Progress Notes (Signed)
Was notified by the NT that the pt had a lighter and cigarette on the bed side table. I came in and explained that [name] was a non-smoking campus and the safety precautions for smoking inside of the hospital. Smoking cessation teaching completed. Pt verbalized that she understood and that she was not going to actually smoke in her room. I confiscated the lighter and cigarette. Will continue to monitor and reiterate smoking cessation to patient.

## 2014-12-14 NOTE — Progress Notes (Signed)
   PATIENT ID: [name]   2 Days Post-Op Procedure(s) (LRB): OPEN REDUCTION INTERNAL FIXATION (ORIF) PROXIMAL HUMERUS FRACTURE (Right)  Subjective: Right shoulder soreness this am, controlled with oral pain rx. Fall yesterday if room after right knee giving out and buckling under her. Has right lateral knee pain today. Has not been weight bearing since the fall.   Objective:  Filed Vitals:   12/14/14 0525  BP: 106/50  Pulse: 86  Temp: 99.2 F (37.3 C)  Resp: 18     Awake, alert, orientated R UE dressing with scant blood, intact Wiggles fingers, distally NVI Right knee with small effusion TTP lateral joint line Crepitance with ROM, no pain Distally R LE NVI   Labs:   Recent Labs  12/13/14 0838 12/14/14 0610  HGB 8.5* 7.9*   Recent Labs  12/13/14 0838 12/14/14 0610  WBC 6.5 4.5  RBC 2.69* 2.37*  HCT 26.2* 23.4*  PLT 159 154  No results for input(s): NA, K, CL, CO2, BUN, CREATININE, GLUCOSE, CALCIUM in the last 72 hours.  Assessment and Plan: 2 day s/p ORIF right shoulder Now with R knee pain s/p fall. Will order xrays to rule out injury. Small effusion today, no need to aspirate, ice to knee ABLA- 7.9 hgb this am, will transfuse 2 units packed RBC D/c to SNF per OT/PT, d/c per primary team  Continue oral pain rx. D/c Norco as oxycodone 5mg  has been her baseline pain regimen for years now.   VTE proph: ASA 325mg  BID, SCDs

## 2014-12-14 NOTE — Progress Notes (Signed)
On call was contacted. Okay to transfuse 1 unit of PRBC.  Blood administered and lasix given.

## 2014-12-14 NOTE — Progress Notes (Signed)
Occupational Therapy Treatment Patient Details Name: [name] MRN: [id number] DOB: 1946-05-01 Today's Date: 12/14/2014    History of present illness Pt s/p fall at home resulting in R proximal humerus fracture. Pt underwent ORIF R UE. Pt recently feel in July and underwent a reverse TSA LUE. Pt with Lung Ca and is undergoing treatment.   OT comments  Pt ambulated to chair today without c/o R knee pain with +1 min A using [place]. Pt did not remember techniques from yesterday. Tolerating R elbow/wrist/hand ROM. Continue to recommend SNF for rehab. Pt not safe to D/C home. Will continue to follow to address established goals.   Follow Up Recommendations  SNF;Supervision/Assistance - 24 hour    Equipment Recommendations  3 in 1 bedside comode    Recommendations for Other Services      Precautions / Restrictions Precautions Precautions: Fall;Shoulder Type of Shoulder Precautions: no shoulder ROM Shoulder Interventions: Shoulder sling/immobilizer;At all times;Off for dressing/bathing/exercises Required Braces or Orthoses: Sling Restrictions Weight Bearing Restrictions: Yes RUE Weight Bearing: Non weight bearing       Mobility Bed Mobility Overal bed mobility: Needs Assistance Bed Mobility: Sidelying to Sit   Sidelying to sit: Mod assist       General bed mobility comments: unable to push up using LUE  Transfers                      Balance             Standing balance-Leahy Scale: Poor                     ADL                   Upper Body Dressing : Moderate assistance (educated on donning sling)                   Functional mobility during ADLs: Minimal assistance;Cueing for safety;Cane General ADL Comments: Increased assistance with bed mobility. unable to remember dressing techniques taught yesterday.       Vision                     Perception     Praxis      Cognition   Behavior During Therapy:  Restless;Anxious Overall Cognitive Status: History of cognitive impairments - at baseline       Memory: Decreased short-term memory;Decreased recall of precautions (unable to recall shoulder precuations from yesterday)               Extremity/Trunk Assessment               Exercises Other Exercises Other Exercises: R elbow/wrist/hand A/AAROM 10-15 reps Other Exercises: sling repositioned Other Exercises: RUE supported with pillows in chair   Shoulder Instructions       General Comments      Pertinent Vitals/ Pain       Pain Assessment: Faces Pain Location: R arm/ R knee Pain Descriptors / Indicators: Aching;Grimacing Pain Intervention(s): Limited activity within patient's tolerance;Monitored during session;Repositioned;Ice applied  Home Living                                          Prior Functioning/Environment              Frequency Min 3X/week     Progress Toward Goals  OT Goals(current goals can now be found in the care plan section)  Progress towards OT goals: Progressing toward goals  Acute Rehab OT Goals Patient Stated Goal: to go home OT Goal Formulation: Patient unable to participate in goal setting Time For Goal Achievement: 12/27/14 Potential to Achieve Goals: Fair ADL Goals Pt Will Perform Upper Body Bathing: with supervision;sitting Pt Will Perform Upper Body Dressing: with supervision;sitting Pt Will Transfer to Toilet: with supervision;ambulating;grab bars Pt/caregiver will Perform Home Exercise Program: Right Upper extremity;With minimal assist;With written HEP provided Additional ADL Goal #1: donn/doff sling with vc only  Plan Discharge plan remains appropriate    Co-evaluation                 End of Session Equipment Utilized During Treatment: Gait belt;Oxygen   Activity Tolerance Patient tolerated treatment well   Patient Left in chair;with call bell/phone within reach;with chair alarm set    Nurse Communication Mobility status;Precautions;Weight bearing status        Time: 6389-3734 OT Time Calculation (min): 23 min  Charges: OT General Charges $OT Visit: 1 Procedure OT Treatments $Self Care/Home Management : 8-22 mins $Therapeutic Activity: 8-22 mins  [facility], OTR/L  [phone] 12/14/2014 12/14/2014, 11:13 AM

## 2014-12-14 NOTE — Clinical Social Work Psychosocial (Signed)
Clinical Social Work Department BRIEF PSYCHOSOCIAL ASSESSMENT 12/14/2014  Patient:  [name], [name]     Account Number:  [id number]     Admit date:  12/10/2014  Clinical Social Worker:  Delrae Sawyers  Date/Time:  12/14/2014 12:20 PM  Referred by:  Physician  Date Referred:  12/14/2014 Referred for  SNF Placement   Other Referral:   none.   Interview type:  Patient Other interview type:   none.    PSYCHOSOCIAL DATA Living Status:  ALONE Admitted from facility:   Level of care:   Primary support name:  Bedelia Person (best friend) Primary support relationship to patient:  FRIEND Degree of support available:   Strong support system.    CURRENT CONCERNS Current Concerns  Post-Acute Placement   Other Concerns:   none.    SOCIAL WORK ASSESSMENT / PLAN CSW received referral for possible SNF placement at time of discharge. CSW met with pt at bedside to discuss discharge disposition. Pt informed CSW pt prefers to complete short-term rehabilitation at [facility] [place]. Per pt, pt receives chemotherapy once every two weeks at [facility].    CSW to continue to follow and assist with discharge planning needs.   Assessment/plan status:  Psychosocial Support/Ongoing Assessment of Needs Other assessment/ plan:   none.   Information/referral to community resources:   Pt to be discharged to [facility] [place].    PATIENT'S/FAMILY'S RESPONSE TO PLAN OF CARE: Pt understanding and agreeable to CSW plan of care. Pt expressed no further questions or concerns at this time.       Lubertha Sayres, [place] ([phone]) Licensed Clinical Social Worker Orthopedics [phone]) and Surgical [phone])

## 2014-12-14 NOTE — Discharge Summary (Addendum)
Physician Discharge Summary  CHESSIE NEUHARTH FUX:[id number] DOB: 04/23/1946 DOA: 12/10/2014  PCP: No PCP Per Patient  Admit date: 12/10/2014 Discharge date: 12/14/2014  Time spent: 55 minutes  Recommendations for Outpatient Follow-up:  1. F/u with Dr Tamera Punt 2. Follow pneumonia 3. O2 PRN for pulse ox < 90%  Discharge Condition: stable Diet recommendation:  Heart healthy  Discharge Diagnoses:  Principal Problem:   Fracture, right humerus closed Active Problems:   COPD GOLD III Pneumonia- uncertain if Hospital acquired vs community acquired   Essential hypertension, benign   Chronic pain   Lung cancer   Fall at home   Postoperative anemia due to acute blood loss   History of present illness:  68 yo female h/o lung cancer on chemo, ptsd, htn, copd comes in after mechanical fall at home and hurt her rt arm.Has prox humerus fx. No loc. Normal state of health prior to tripping on something at home.   Hospital Course:  Principal Problem:  Fracture, humerus closed - Ortho following- s/p ORIF on 12/12/14 and OK to d/c to SNF from their recommendations - Pain control ordered per ortho-cont Percocet which she was taking prior to admission- dose adjusted - ASA 325 BID for VTE prophylaxis - duration not mentioned  Active Problems:  COPD GOLD III - stable - continue albuterol- Spiriva added by Dr Wendee Beavers - has O2 at home and is supposed to be on 2 L but she states she does not use it  Fever -Pneumonia - 100.9 prior to blood transfusion yesterday improving without tylenol - CXR showing mild infiltrate vs atelectasis in RLL- coughing up yellow sputum- she now states that she was started on Amoxil as outpt by her PCP for this cough (Amoxil not mentioned on med rec) - start Doxycyline and Augmentin x 7 day course- oxygen level 93% on room air- stable to d/c to SNF - UA negative   Anemia - In context of recent operation and possibly also dilutional and Anemia of acute illness.   - Hb dropped from 11.9 to 7.9 steadily- baseline is near 12- ortho has ordered 2 U PRBC today.  R knee pain -new complaint today - has been evaluated by ortho - xray reveals small effusion- ice recommended to knee   Essential hypertension, benign - cont Atenolol   Chronic pain - Pain control with Percocet as outpt   Lung cancer - cont outpt f/u- on chemo    Procedures:  12/14 OPEN REDUCTION INTERNAL FIXATION (ORIF) RIGHT 3 PART PROXIMAL HUMERUS FRACTURE   Consultations:  Ortho - Dr Tamera Punt  Discharge Exam: Filed Weights   12/10/14 2147  Weight: 74.39 kg (164 lb)   Filed Vitals:   12/14/14 0525  BP: 106/50  Pulse: 86  Temp: 99.2 F (37.3 C)  Resp: 18    General: AAO x 3, no distress Cardiovascular: RRR, no murmurs  Respiratory: clear to auscultation bilaterally GI: soft, non-tender, non-distended, bowel sound positive  Discharge Instructions You were cared for by a hospitalist during your hospital stay. If you have any questions about your discharge medications or the care you received while you were in the hospital after you are discharged, you can call the unit and asked to speak with the hospitalist on call if the hospitalist that took care of you is not available. Once you are discharged, your primary care physician will handle any further medical issues. Please note that NO REFILLS for any discharge medications will be authorized once you are discharged, as it is  imperative that you return to your primary care physician (or establish a relationship with a primary care physician if you do not have one) for your aftercare needs so that they can reassess your need for medications and monitor your lab values.     Medication List    STOP taking these medications        oxyCODONE-acetaminophen 7.5-325 MG per tablet  Commonly known as:  PERCOCET  Replaced by:  oxyCODONE-acetaminophen 5-325 MG per tablet      TAKE these medications         oxyCODONE-acetaminophen 5-325 MG per tablet  Commonly known as:  ROXICET  Take 1-2 tablets by mouth every 4 (four) hours as needed for severe pain.      ASK your doctor about these medications        Aclidinium Bromide 400 MCG/ACT Aepb  Commonly known as:  TUDORZA PRESSAIR  Inhale 1 puff into the lungs 2 (two) times daily.     albuterol 108 (90 BASE) MCG/ACT inhaler  Commonly known as:  VENTOLIN HFA  Inhale 2 puffs into the lungs every 6 (six) hours as needed for wheezing or shortness of breath.     albuterol (2.5 MG/3ML) 0.083% nebulizer solution  Commonly known as:  PROVENTIL  Take 2.5 mg by nebulization as needed for wheezing.     ALPRAZolam 1 MG tablet  Commonly known as:  XANAX  Take 1 tablet (1 mg total) by mouth every 6 (six) hours as needed for anxiety.     atenolol 50 MG tablet  Commonly known as:  TENORMIN  Take 1 tablet (50 mg total) by mouth daily.     benzonatate 200 MG capsule  Commonly known as:  TESSALON  Take 1 capsule (200 mg total) by mouth 3 (three) times daily as needed for cough.     chlorzoxazone 500 MG tablet  Commonly known as:  PARAFON  Take 500 mg by mouth 4 (four) times daily as needed for muscle spasms.     dextromethorphan-guaiFENesin 30-600 MG per 12 hr tablet  Commonly known as:  MUCINEX DM  Take 1 tablet by mouth every 12 (twelve) hours.     esomeprazole 40 MG capsule  Commonly known as:  NEXIUM  Take 40 mg by mouth daily as needed (for acid reflux).     levothyroxine 50 MCG tablet  Commonly known as:  SYNTHROID  Take 1 tablet (50 mcg total) by mouth daily before breakfast.     ondansetron 4 MG disintegrating tablet  Commonly known as:  ZOFRAN ODT  Take 1 tablet (4 mg total) by mouth every 8 (eight) hours as needed for nausea or vomiting.     promethazine 25 MG tablet  Commonly known as:  PHENERGAN  Take 25 mg by mouth every 6 (six) hours as needed for nausea or vomiting.     sertraline 100 MG tablet  Commonly known as:  ZOLOFT   Take 200 mg by mouth daily.     trazodone 300 MG tablet  Commonly known as:  DESYREL  Take 300 mg by mouth at bedtime.       Allergies  Allergen Reactions  . Carafate [Sucralfate] Nausea Only  . Prednisone     insomnia  . Nsaids     GI Upset  . Aspirin Other (See Comments)    Gi symptoms. Does NOT take ibuprofen or other NSAIDS  . Ibuprofen     Gi upset  . Dulera [Mometasone Furo-Formoterol Fum] Palpitations  Panting " my body was twisted inside and out"   Follow-up Information    Follow up with Nita Sells, MD. Schedule an appointment as soon as possible for a visit in 2 weeks.   Specialty:  Orthopedic Surgery   Contact information:   [address] [place] [address] [phone]        The results of significant diagnostics from this hospitalization (including imaging, microbiology, ancillary and laboratory) are listed below for reference.    Significant Diagnostic Studies: Dg Chest 1 View  12/10/2014   CLINICAL DATA:  Fall today on to hard surface, shoulder pain, history of metastatic lung cancer.  EXAM: CHEST - 1 VIEW  COMPARISON:  Chest radiograph November 15, 2014  FINDINGS: Limited assessment as patient is rotated to the RIGHT, RIGHT breast attenuation. Cardiomediastinal silhouette grossly normal though, rotation accentuates the RIGHT hilum. Calcified aortic knob. No pleural effusions or focal consolidation. No pneumothorax.  Status post ACDF. LEFT humeral arthroplasty. Comminuted RIGHT humeral fracture dislocation better seen on radiograph from same day, reported separately.  IMPRESSION: No definite acute cardiopulmonary process with a aforementioned limitations.   Electronically Signed   By: Elon Alas   On: 12/10/2014 22:58   Dg Chest 1 View  11/15/2014   CLINICAL DATA:  Shortness of breath, cough, history of lung cancer  EXAM: CHEST - 1 VIEW  COMPARISON:  07/28/2014  FINDINGS: Cardiomediastinal silhouette is stable. Right  head that left shoulder prosthesis again noted. No acute infiltrate or pulmonary edema.  IMPRESSION: No active disease.   Electronically Signed   By: Lahoma Crocker M.D.   On: 11/15/2014 13:43   Dg Shoulder Right  12/12/2014   CLINICAL DATA:  ORIF right shoulder.  EXAM: RIGHT SHOULDER - 2+ VIEW  COMPARISON:  12/10/2014  FINDINGS: Initial image demonstrates evidence of patient's moderately displaced humeral head and neck fracture. Subsequent images demonstrate lateral fixation plate with associated screws bridging patient's head/neck fracture as there is anatomic alignment about the fracture site and glenohumeral joint. Hardware is intact.  IMPRESSION: Fixation patient's humeral head and neck fracture with hardware intact and anatomic alignment about the fracture site.   Electronically Signed   By: Marin Olp M.D.   On: 12/12/2014 17:33   Dg Shoulder Right  12/10/2014   CLINICAL DATA:  Fall, landed on a hard surface, shoulder pain. History of metastatic lung cancer.  EXAM: RIGHT SHOULDER - 2+ VIEW  COMPARISON:  None.  FINDINGS: Comminuted RIGHT humerus surgical neck fracture with medial deviation distal body fragments, impaction. At least 4 fracture fragments identified. Widened subacromial joint space. 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 No destructive bony lesions. Soft tissue swelling without subcutaneous gas or radiopaque foreign bodies.  IMPRESSION: RIGHT humerus surgical neck fracture dislocation appears posttraumatic.   Electronically Signed   By: Elon Alas   On: 12/10/2014 22:56   Dg Hip Complete Right  11/15/2014   CLINICAL DATA:  Right hip pain post fall 2 days ago at home  EXAM: RIGHT HIP - COMPLETE 2+ VIEW  COMPARISON:  01/04/2010  FINDINGS: Three views of the right hip submitted. No acute fracture or subluxation. There are degenerative changes with narrowing of superior joint space bilateral hip joint. Mild spurring bilateral femoral head. Mild sclerosis of right  superior acetabulum. Atherosclerotic calcifications of femoral arteries.  IMPRESSION: No acute fracture or subluxation. Osteoarthritic changes as described above.   Electronically Signed   By: Lahoma Crocker M.D.   On: 11/15/2014 13:33  Dg Knee 1-2 Views Right  12/12/2014   CLINICAL DATA:  Acute right knee pain, fall Saturday  EXAM: RIGHT KNEE - 1-2 VIEW  COMPARISON:  None.  FINDINGS: Two views of the right knee submitted. Diffuse mild narrowing of joint space. There is chondrocalcinosis. Narrowing of patellofemoral joint space. Spurring of patella. No joint effusion. No acute fracture or subluxation.  IMPRESSION: No acute fracture or subluxation. Osteoarthritic changes as described above. Chondrocalcinosis.   Electronically Signed   By: Lahoma Crocker M.D.   On: 12/12/2014 09:22   Ct Chest W Contrast  11/21/2014   CLINICAL DATA:  Stage IIIA non-small cell lung cancer. Liver cancer 2 years ago. Status post chemotherapy and radiation therapy. Left anterior chest pain since 3 days ago. Hypertension and asthma.  EXAM: CT CHEST WITH CONTRAST  TECHNIQUE: Multidetector CT imaging of the chest was performed during intravenous contrast administration.  CONTRAST:  70mL OMNIPAQUE IOHEXOL 300 MG/ML  SOLN  COMPARISON:  Chest radiograph 11/15/2014. Most recent CT of 09/15/2014. Clinic note of 11/08/2014.  FINDINGS: Lungs/Pleura: Favor fluid within the bronchus intermedius dependently. Example image 29 of series 3. This is new or increased. Right lower lobe endobronchial compression or obstruction is similar.  Moderate centrilobular emphysema. Similar 3 mm right upper lobe lung nodule on image 22. Right infrahilar spiculated nodule surrounding and obstructing right lower lobe bronchi measures 1.8 x 1.8 cm on image 34. This is unchanged (when remeasured).  Patchy paramediastinal right upper and right lower lobe airspace disease is primarily similar. A right upper lobe 12 mm nodule with surrounding ground-glass opacity on the  prior exam has resolved. Patchy new paramediastinal left upper lobe ground-glass and airspace opacities are mild. Example image 24 of series 3.  Right-sided pleural fluid is decreased, trace. There is trace left pleural fluid or thickening which is not significantly changed.  Heart/Mediastinum: Beam hardening artifact from left shoulder arthroplasty. Aortic and branch vessel atherosclerosis. Left subclavian artery atherosclerosis which is on the order of 40% on image fourteen. Mild cardiomegaly with trace anterior pericardial fluid or thickening, new. Multivessel coronary artery atherosclerosis. Middle mediastinal nodes. A 9 mm AP window node is similar to on the prior exam. Low right paratracheal node measures 9 mm and is also not significantly changed. No left hilar adenopathy.  Upper Abdomen: Normal imaged portions of the pancreas, spleen, stomach, gallbladder, adrenal glands. The lateral segment left liver lobe 11 mm focus of hyper attenuation is less conspicuous today. 1.0 cm on image 49.  No new liver lesions are identified. Advanced abdominal aortic and branch vessel atherosclerosis.  Bones/Musculoskeletal: Moderate osteopenia. Lower cervical spine fixation. Removal lower left rib trauma.  IMPRESSION: 1. Right infrahilar/central right lower lobe nodule is unchanged. This causes similar narrowing/obstruction of right lower lobe bronchi. 2. paramediastinal opacities which are likely radiation induced. A right upper lobe nodular density has resolved since the prior exam and was likely inflammatory. Response of a metastatic lesion felt less likely. 3. Decreased  trace right-sided pleural fluid. 4. New trace pericardial effusion. 5. Decreased conspicuity of a the left hepatic lobe low-density focus. Indeterminate but may represent response to therapy of metastatic disease.   Electronically Signed   By: Abigail Miyamoto M.D.   On: 11/21/2014 16:54   Ct Shoulder Right Wo Contrast  12/11/2014   CLINICAL DATA:  Fall  occur today. Tripped and fall injury landing with arm stretched out. Constant pain right shoulder. Chemotherapy yesterday for lung cancer. Humerus fracture on plain films 12/10/2014 for additional evaluation.  EXAM: CT OF THE RIGHT SHOULDER WITHOUT CONTRAST  TECHNIQUE: Multidetector CT imaging was performed according to the standard protocol. Multiplanar CT image reconstructions were also generated.  COMPARISON:  Right shoulder radiographs 12/10/2014. CT chest 11/21/2014.  FINDINGS: Comminuted fractures involving the right proximal humerus. Transverse fracture through the humeral neck with complete medial displacement of the distal fracture fragment inferior and medial to the inferior glenoid. The humeral head fracture is displaced inferiorly with complete glenohumeral dislocation. Comminuted fractures of the humeral head with displaced greater tuberosity fragments. Additional displaced fragments demonstrated in the region of the fracture. Acromioclavicular joint demonstrates degenerative changes without dislocation. Glenoid appears intact. Associated effusion.  IMPRESSION: Comminuted and displaced fractures of the proximal humeral head and neck with glenohumeral dislocation.   Electronically Signed   By: Lucienne Capers M.D.   On: 12/11/2014 01:15   Dg Chest Port 1 View  12/12/2014   CLINICAL DATA:  Fever, bronchitis, history of lung cancer, fall with right shoulder fracture  EXAM: PORTABLE CHEST - 1 VIEW  COMPARISON:  A CT right shoulder dated 12/11/2014. Chest radiograph dated 12/10/2014. CT chest dated 11/21/2014.  FINDINGS: Mild patchy right middle lobe opacity, likely reflecting paramediastinal radiation changes when correlating with prior CT.  Mild patchy left basilar opacity, possibly radiation changes, pneumonia not excluded.  No pleural effusion or pneumothorax.  The heart is normal in size.  Comminuted, displaced right proximal humeral fracture, better evaluated on prior CT. Left shoulder  arthroplasty. Cervical spine fixation hardware.  IMPRESSION: Mild patchy left basilar opacity, possibly radiation changes, marrow excluded.  Mild patchy right middle lobe opacity, likely reflecting paramediastinal radiation changes.  Comminuted, displaced right proximal humeral fracture, better evaluated on CT.   Electronically Signed   By: Julian Hy M.D.   On: 12/12/2014 07:20   Dg Knee 4 Views W/patella Right  12/14/2014   CLINICAL DATA:  Inj,fell 4 days ago,acute rt knee pain at knee cap,swelling as well  EXAM: RIGHT KNEE - COMPLETE 4+ VIEW  COMPARISON:  12/12/2014  FINDINGS: Diffuse osteopenia. Chondrocalcinosis in medial and lateral compartments. Negative for fracture, dislocation, or other acute bone abnormality. No effusion. Patchy femoral-popliteal arterial calcifications.  IMPRESSION: 1. Negative for fracture or other acute bone abnormality. 2. Chondrocalcinosis suggesting CPPD.   Electronically Signed   By: Arne Cleveland M.D.   On: 12/14/2014 09:48   Dg Foot Complete Left  11/15/2014   CLINICAL DATA:  Left foot pain redness and swelling, fall 2 days ago at home  EXAM: LEFT FOOT - COMPLETE 3+ VIEW  COMPARISON:  01/21/2006  FINDINGS: Three views of the left foot submitted. No acute fracture or subluxation. Mild hallux valgus deformity. Diffuse osteopenia. Question old fracture of distal fourth metatarsal.  IMPRESSION: No acute fracture or subluxation. Diffuse osteopenia. Mild hallux valgus deformity. Question old fracture of distal aspect fourth metatarsal.   Electronically Signed   By: Lahoma Crocker M.D.   On: 11/15/2014 13:35   Dg C-arm 61-120 Min-no Report  12/12/2014   CLINICAL DATA: ORIF proximal humerus, right   C-ARM 61-120 MINUTES  Fluoroscopy was utilized by the requesting physician.  No radiographic  interpretation.     Microbiology: Recent Results (from the past 240 hour(s))  MRSA PCR Screening     Status: None   Collection Time: 12/11/14  9:27 PM  Result Value Ref Range  Status   MRSA by PCR NEGATIVE NEGATIVE Final    Comment:        The GeneXpert MRSA Assay (FDA approved  for NASAL specimens only), is one component of a comprehensive MRSA colonization surveillance program. It is not intended to diagnose MRSA infection nor to guide or monitor treatment for MRSA infections.   Culture, blood (routine x 2)     Status: None (Preliminary result)   Collection Time: 12/12/14  8:00 AM  Result Value Ref Range Status   Specimen Description BLOOD LEFT HAND  Final   Special Requests BOTTLES DRAWN AEROBIC ONLY 1.5CC  Final   Culture  Setup Time   Final    12/12/2014 14:15 Performed at [facility]    Culture   Final           BLOOD CULTURE RECEIVED NO GROWTH TO DATE CULTURE WILL BE HELD FOR 5 DAYS BEFORE ISSUING A FINAL NEGATIVE REPORT Note: Culture results may be compromised due to an inadequate volume of blood received in culture bottles. Performed at [facility]    Report Status PENDING  Incomplete  Culture, blood (routine x 2)     Status: None (Preliminary result)   Collection Time: 12/12/14  8:10 AM  Result Value Ref Range Status   Specimen Description BLOOD RIGHT HAND  Final   Special Requests   Final    BOTTLES DRAWN AEROBIC AND ANAEROBIC 10CC BLUE,5CC RED   Culture  Setup Time   Final    12/12/2014 14:17 Performed at [facility]    Culture   Final           BLOOD CULTURE RECEIVED NO GROWTH TO DATE CULTURE WILL BE HELD FOR 5 DAYS BEFORE ISSUING A FINAL NEGATIVE REPORT Performed at [facility]    Report Status PENDING  Incomplete     Labs: Basic Metabolic Panel:  Recent Labs Lab 12/09/14 1100 12/11/14 0054 12/11/14 0600  NA 134* 132* 130*  K 4.2 4.2 4.4  CL  --  94* 94*  CO2 27 28 25   GLUCOSE 259* 276* 268*  BUN 8.8 7 6   CREATININE 0.9 0.71 0.51  CALCIUM 8.4 8.6 8.4   Liver Function Tests:  Recent Labs Lab 12/09/14 1100 12/11/14 0054  AST 15 14  ALT 12 10  ALKPHOS 138 131*  BILITOT 0.52  <0.2*  PROT 7.1 7.2  ALBUMIN 3.7 3.6   No results for input(s): LIPASE, AMYLASE in the last 168 hours. No results for input(s): AMMONIA in the last 168 hours. CBC:  Recent Labs Lab 12/09/14 1100 12/11/14 0054 12/11/14 0600 12/13/14 0838 12/14/14 0610  WBC 5.1 9.0 6.5 6.5 4.5  NEUTROABS 3.7 7.5  --   --   --   HGB 13.1 11.9* 10.7* 8.5* 7.9*  HCT 40.7 35.3* 32.5* 26.2* 23.4*  MCV 98.9 96.7 95.6 97.4 98.7  PLT 174 158 144* 159 154   Cardiac Enzymes: No results for input(s): CKTOTAL, CKMB, CKMBINDEX, TROPONINI in the last 168 hours. BNP: BNP (last 3 results) No results for input(s): PROBNP in the last 8760 hours. CBG: No results for input(s): GLUCAP in the last 168 hours.     SignedDebbe Odea, MD [facility] 12/14/2014, 11:04 AM

## 2014-12-15 ENCOUNTER — Inpatient Hospital Stay (HOSPITAL_COMMUNITY): Payer: Medicare Other

## 2014-12-15 ENCOUNTER — Ambulatory Visit: Payer: Medicare Other | Admitting: Physician Assistant

## 2014-12-15 ENCOUNTER — Other Ambulatory Visit: Payer: Medicare Other

## 2014-12-15 DIAGNOSIS — S42301A Unspecified fracture of shaft of humerus, right arm, initial encounter for closed fracture: Secondary | ICD-10-CM

## 2014-12-15 LAB — URINALYSIS, ROUTINE W REFLEX MICROSCOPIC
BILIRUBIN URINE: NEGATIVE
GLUCOSE, UA: 500 mg/dL — AB
Hgb urine dipstick: NEGATIVE
Ketones, ur: NEGATIVE mg/dL
Leukocytes, UA: NEGATIVE
Nitrite: NEGATIVE
PH: 7 (ref 5.0–8.0)
Protein, ur: NEGATIVE mg/dL
Specific Gravity, Urine: 1.013 (ref 1.005–1.030)
Urobilinogen, UA: 0.2 mg/dL (ref 0.0–1.0)

## 2014-12-15 LAB — CBC
HCT: 25.2 % — ABNORMAL LOW (ref 36.0–46.0)
HEMATOCRIT: 26.9 % — AB (ref 36.0–46.0)
HEMOGLOBIN: 8.5 g/dL — AB (ref 12.0–15.0)
HEMOGLOBIN: 8.9 g/dL — AB (ref 12.0–15.0)
MCH: 32.1 pg (ref 26.0–34.0)
MCH: 31.6 pg (ref 26.0–34.0)
MCHC: 33.7 g/dL (ref 30.0–36.0)
MCHC: 33.1 g/dL (ref 30.0–36.0)
MCV: 95.1 fL (ref 78.0–100.0)
MCV: 95.4 fL (ref 78.0–100.0)
PLATELETS: 149 10*3/uL — AB (ref 150–400)
Platelets: 155 10*3/uL (ref 150–400)
RBC: 2.65 MIL/uL — AB (ref 3.87–5.11)
RBC: 2.82 MIL/uL — ABNORMAL LOW (ref 3.87–5.11)
RDW: 14.9 % (ref 11.5–15.5)
RDW: 14.7 % (ref 11.5–15.5)
WBC: 3.7 10*3/uL — AB (ref 4.0–10.5)
WBC: 3.6 10*3/uL — AB (ref 4.0–10.5)

## 2014-12-15 MED ORDER — AMOXICILLIN-POT CLAVULANATE 875-125 MG PO TABS
1.0000 | ORAL_TABLET | Freq: Two times a day (BID) | ORAL | Status: DC
  Filled 2014-12-15 (×2): qty 1

## 2014-12-15 MED ORDER — AMOXICILLIN-POT CLAVULANATE 875-125 MG PO TABS: 1.0000 | ORAL_TABLET | Freq: Two times a day (BID) | ORAL | Status: DC

## 2014-12-15 MED ORDER — DOXYCYCLINE HYCLATE 100 MG PO TABS: 100.0000 mg | ORAL_TABLET | Freq: Two times a day (BID) | ORAL | Status: DC

## 2014-12-15 MED ORDER — DOXYCYCLINE HYCLATE 100 MG PO TABS
100.0000 mg | ORAL_TABLET | Freq: Two times a day (BID) | ORAL | Status: DC
  Filled 2014-12-15 (×2): qty 1

## 2014-12-15 NOTE — Progress Notes (Signed)
I have examined the patient and discussed the plan. Patient stable to discharge to SNF today- please see updated d/c summary from 12/16.   Debbe Odea, MD

## 2014-12-15 NOTE — Progress Notes (Signed)
Physical Therapy Treatment Patient Details Name: [name] MRN: [id number] DOB: [birth year] Today's Date: 12/15/2014    History of Present Illness Pt s/p fall at home resulting in R proximal humerus fracture. Pt underwent ORIF R UE. Pt recently feel in July and underwent a reverse TSA LUE. Pt with Lung Ca and is undergoing treatment.    PT Comments    With max encourage patient was agreeable to walk to restroom and back instead of using BSC. Afterwards she was very appreciative of PTA pushing her to do more. Continue to recommend SNF for ongoing Physical Therapy.     Follow Up Recommendations  SNF;Supervision/Assistance - 24 hour     Equipment Recommendations  Other (comment) (TBD)    Recommendations for Other Services       Precautions / Restrictions Precautions Precautions: Fall;Shoulder Type of Shoulder Precautions: no shoulder ROM Shoulder Interventions: Shoulder sling/immobilizer;At all times;Off for dressing/bathing/exercises Required Braces or Orthoses: Sling Restrictions Weight Bearing Restrictions: No RUE Weight Bearing: Non weight bearing    Mobility  Bed Mobility Overal bed mobility: Needs Assistance     Sidelying to sit: Mod assist Supine to sit: Min assist     General bed mobility comments: Able to use LUE with rails. Mod A for trunk support and for Min A for LEs back into bed  Transfers Overall transfer level: Needs assistance Equipment used: 1 person hand held assist   Sit to Stand: Min assist         General transfer comment: unsteady. vc to not use RUE to push up. A to shift weight anteriorly  Ambulation/Gait Ambulation/Gait assistance: Min assist Ambulation Distance (Feet): 30 Feet Assistive device: 1 person hand held assist Gait Pattern/deviations: Shuffle   Gait velocity interpretation: Below normal speed for age/gender General Gait Details: trunk flexed; agreeable to ambulate to bathroom and back. Cues for upright posture     Stairs            Wheelchair Mobility    Modified Rankin (Stroke Patients Only)       Balance                                    Cognition Arousal/Alertness: Awake/alert Behavior During Therapy: WFL for tasks assessed/performed;Anxious Overall Cognitive Status: History of cognitive impairments - at baseline       Memory: Decreased short-term memory;Decreased recall of precautions              Exercises      General Comments        Pertinent Vitals/Pain Faces Pain Scale: Hurts little more Pain Location: R arm in standing  Pain Descriptors / Indicators: Aching Pain Intervention(s): Monitored during session    Home Living                      Prior Function            PT Goals (current goals can now be found in the care plan section) Progress towards PT goals: Progressing toward goals    Frequency  Min 3X/week    PT Plan Current plan remains appropriate    Co-evaluation             End of Session Equipment Utilized During Treatment: Other (comment) (sling) Activity Tolerance: Patient tolerated treatment well Patient left: in bed;with bed alarm set;with call bell/phone within reach     Time: 1740-8144 PT  Time Calculation (min) (ACUTE ONLY): 15 min  Charges:  $Gait Training: 8-22 mins                    G Codes:      Jacqualyn Posey 12/15/2014, 9:53 AM 12/15/2014 Jacqualyn Posey PTA [phone] pager [phone] office

## 2014-12-15 NOTE — Progress Notes (Signed)
Report called to receiving nurse at [facility]. Patient was D/C in stable condition with copy of chart,script.

## 2014-12-16 LAB — TYPE AND SCREEN
ABO/RH(D): A POS
Antibody Screen: NEGATIVE
UNIT DIVISION: 0
UNIT DIVISION: 0
Unit division: 0

## 2014-12-18 LAB — CULTURE, BLOOD (ROUTINE X 2)
Culture: NO GROWTH
Culture: NO GROWTH

## 2014-12-18 LAB — URINE CULTURE: Colony Count: >=100000

## 2014-12-21 ENCOUNTER — Ambulatory Visit: Payer: Medicare Other

## 2014-12-26 ENCOUNTER — Encounter (HOSPITAL_COMMUNITY): Payer: Self-pay | Admitting: Anesthesiology

## 2014-12-26 ENCOUNTER — Ambulatory Visit (HOSPITAL_COMMUNITY): Payer: Medicare Other | Admitting: Psychology

## 2014-12-26 NOTE — Anesthesia Preprocedure Evaluation (Deleted)
Anesthesia Evaluation   Patient awake    Reviewed: Allergy & Precautions, H&P , NPO status , Patient's Chart, lab work & pertinent test results  Airway Mallampati: II  TM Distance: >3 FB     Dental  (+) Teeth Intact   Pulmonary Current Smoker,  breath sounds clear to auscultation        Cardiovascular hypertension, Rhythm:Regular     Neuro/Psych    GI/Hepatic   Endo/Other    Renal/GU      Musculoskeletal   Abdominal   Peds  Hematology   Anesthesia Other Findings   Reproductive/Obstetrics                             Anesthesia Physical Anesthesia Plan  ASA: II  Anesthesia Plan: General   Post-op Pain Management:    Induction:   Airway Management Planned:   Additional Equipment:   Intra-op Plan:   Post-operative Plan:   Informed Consent: I have reviewed the patients History and Physical, chart, labs and discussed the procedure including the risks, benefits and alternatives for the proposed anesthesia with the patient or authorized representative who has indicated his/her understanding and acceptance.     Plan Discussed with:   Anesthesia Plan Comments:         Anesthesia Quick Evaluation

## 2014-12-27 ENCOUNTER — Other Ambulatory Visit: Payer: Medicare Other

## 2014-12-27 ENCOUNTER — Ambulatory Visit: Payer: Medicare Other | Admitting: Physician Assistant

## 2014-12-27 ENCOUNTER — Ambulatory Visit: Payer: Medicare Other

## 2015-01-01 ENCOUNTER — Emergency Department (HOSPITAL_COMMUNITY)
Admission: EM | Admit: 2015-01-01 | Discharge: 2015-01-01 | Disposition: A | Discharge status: Home / Self Care | Payer: Medicare Other | Attending: Emergency Medicine | Admitting: Emergency Medicine

## 2015-01-01 ENCOUNTER — Emergency Department (HOSPITAL_COMMUNITY): Payer: Medicare Other

## 2015-01-01 ENCOUNTER — Encounter (HOSPITAL_COMMUNITY): Payer: Self-pay | Admitting: *Deleted

## 2015-01-01 DIAGNOSIS — M1990 Unspecified osteoarthritis, unspecified site: Secondary | ICD-10-CM | POA: Insufficient documentation

## 2015-01-01 DIAGNOSIS — Z8673 Personal history of transient ischemic attack (TIA), and cerebral infarction without residual deficits: Secondary | ICD-10-CM | POA: Insufficient documentation

## 2015-01-01 DIAGNOSIS — Z923 Personal history of irradiation: Secondary | ICD-10-CM | POA: Insufficient documentation

## 2015-01-01 DIAGNOSIS — N390 Urinary tract infection, site not specified: Secondary | ICD-10-CM | POA: Diagnosis not present

## 2015-01-01 DIAGNOSIS — Z85118 Personal history of other malignant neoplasm of bronchus and lung: Secondary | ICD-10-CM | POA: Insufficient documentation

## 2015-01-01 DIAGNOSIS — Z79899 Other long term (current) drug therapy: Secondary | ICD-10-CM | POA: Diagnosis not present

## 2015-01-01 DIAGNOSIS — Z720 Tobacco use: Secondary | ICD-10-CM | POA: Insufficient documentation

## 2015-01-01 DIAGNOSIS — E1165 Type 2 diabetes mellitus with hyperglycemia: Secondary | ICD-10-CM | POA: Insufficient documentation

## 2015-01-01 DIAGNOSIS — F329 Major depressive disorder, single episode, unspecified: Secondary | ICD-10-CM | POA: Diagnosis not present

## 2015-01-01 DIAGNOSIS — Z7982 Long term (current) use of aspirin: Secondary | ICD-10-CM | POA: Diagnosis not present

## 2015-01-01 DIAGNOSIS — I10 Essential (primary) hypertension: Secondary | ICD-10-CM | POA: Diagnosis not present

## 2015-01-01 DIAGNOSIS — M25511 Pain in right shoulder: Secondary | ICD-10-CM | POA: Insufficient documentation

## 2015-01-01 DIAGNOSIS — F419 Anxiety disorder, unspecified: Secondary | ICD-10-CM | POA: Insufficient documentation

## 2015-01-01 DIAGNOSIS — R5383 Other fatigue: Secondary | ICD-10-CM | POA: Diagnosis present

## 2015-01-01 DIAGNOSIS — R079 Chest pain, unspecified: Secondary | ICD-10-CM | POA: Diagnosis not present

## 2015-01-01 DIAGNOSIS — Z792 Long term (current) use of antibiotics: Secondary | ICD-10-CM | POA: Insufficient documentation

## 2015-01-01 DIAGNOSIS — J449 Chronic obstructive pulmonary disease, unspecified: Secondary | ICD-10-CM | POA: Diagnosis not present

## 2015-01-01 DIAGNOSIS — R739 Hyperglycemia, unspecified: Secondary | ICD-10-CM

## 2015-01-01 LAB — CBC WITH DIFFERENTIAL/PLATELET
Basophils Absolute: 0 10*3/uL (ref 0.0–0.1)
Basophils Relative: 1 % (ref 0–1)
EOS PCT: 1 % (ref 0–5)
Eosinophils Absolute: 0 10*3/uL (ref 0.0–0.7)
HEMATOCRIT: 31.7 % — AB (ref 36.0–46.0)
HEMOGLOBIN: 10.7 g/dL — AB (ref 12.0–15.0)
LYMPHS ABS: 0.8 10*3/uL (ref 0.7–4.0)
Lymphocytes Relative: 23 % (ref 12–46)
MCH: 33.3 pg (ref 26.0–34.0)
MCHC: 33.8 g/dL (ref 30.0–36.0)
MCV: 98.8 fL (ref 78.0–100.0)
Monocytes Absolute: 0.2 10*3/uL (ref 0.1–1.0)
Monocytes Relative: 6 % (ref 3–12)
NEUTROS PCT: 69 % (ref 43–77)
Neutro Abs: 2.5 10*3/uL (ref 1.7–7.7)
Platelets: 157 10*3/uL (ref 150–400)
RBC: 3.21 MIL/uL — AB (ref 3.87–5.11)
RDW: 14.5 % (ref 11.5–15.5)
WBC: 3.6 10*3/uL — ABNORMAL LOW (ref 4.0–10.5)

## 2015-01-01 LAB — CBG MONITORING, ED
GLUCOSE-CAPILLARY: 320 mg/dL — AB (ref 70–99)
Glucose-Capillary: 356 mg/dL — ABNORMAL HIGH (ref 70–99)

## 2015-01-01 LAB — URINALYSIS, ROUTINE W REFLEX MICROSCOPIC
BILIRUBIN URINE: NEGATIVE
GLUCOSE, UA: 250 mg/dL — AB
Ketones, ur: 40 mg/dL — AB
LEUKOCYTES UA: NEGATIVE
NITRITE: NEGATIVE
PH: 7 (ref 5.0–8.0)
Protein, ur: NEGATIVE mg/dL
SPECIFIC GRAVITY, URINE: 1.015 (ref 1.005–1.030)
Urobilinogen, UA: 0.2 mg/dL (ref 0.0–1.0)

## 2015-01-01 LAB — BASIC METABOLIC PANEL
Anion gap: 10 (ref 5–15)
BUN: 7 mg/dL (ref 6–23)
CO2: 26 mmol/L (ref 19–32)
Calcium: 8.3 mg/dL — ABNORMAL LOW (ref 8.4–10.5)
Chloride: 95 mEq/L — ABNORMAL LOW (ref 96–112)
Creatinine, Ser: 0.67 mg/dL (ref 0.50–1.10)
GFR calc Af Amer: >90 mL/min (ref >90)
GFR calc non Af Amer: 89 mL/min — ABNORMAL LOW (ref >90)
GLUCOSE: 370 mg/dL — AB (ref 70–99)
POTASSIUM: 3.7 mmol/L (ref 3.5–5.1)
Sodium: 131 mmol/L — ABNORMAL LOW (ref 135–145)

## 2015-01-01 LAB — TROPONIN I

## 2015-01-01 LAB — URINE MICROSCOPIC-ADD ON

## 2015-01-01 LAB — CK: Total CK: 129 U/L (ref 7–177)

## 2015-01-01 MED ORDER — CHLORZOXAZONE 500 MG PO TABS
ORAL_TABLET | ORAL | Status: AC
  Filled 2015-01-01: qty 1

## 2015-01-01 MED ORDER — PANTOPRAZOLE SODIUM 40 MG PO TBEC
40.0000 mg | DELAYED_RELEASE_TABLET | Freq: Every day | ORAL | Status: DC
  Administered 2015-01-01: 40 mg via ORAL
  Filled 2015-01-01: qty 1

## 2015-01-01 MED ORDER — TRAZODONE HCL 50 MG PO TABS: 300.0000 mg | ORAL_TABLET | Freq: Every day | ORAL | Status: DC

## 2015-01-01 MED ORDER — ALPRAZOLAM 0.5 MG PO TABS
2.0000 mg | ORAL_TABLET | Freq: Three times a day (TID) | ORAL | Status: DC | PRN
  Administered 2015-01-01: 2 mg via ORAL
  Filled 2015-01-01: qty 4

## 2015-01-01 MED ORDER — DEXTROSE 5 % IV SOLN
1.0000 g | Freq: Once | INTRAVENOUS | Status: AC
  Administered 2015-01-01: 1 g via INTRAVENOUS
  Filled 2015-01-01: qty 10

## 2015-01-01 MED ORDER — CEPHALEXIN 500 MG PO CAPS: 500.0000 mg | ORAL_CAPSULE | Freq: Two times a day (BID) | ORAL | Status: DC

## 2015-01-01 MED ORDER — HYDROCODONE-ACETAMINOPHEN 5-325 MG PO TABS
1.0000 | ORAL_TABLET | Freq: Once | ORAL | Status: AC
  Administered 2015-01-01: 1 via ORAL
  Filled 2015-01-01: qty 1

## 2015-01-01 MED ORDER — TIOTROPIUM BROMIDE MONOHYDRATE 18 MCG IN CAPS
18.0000 ug | ORAL_CAPSULE | Freq: Every day | RESPIRATORY_TRACT | Status: DC
  Filled 2015-01-01: qty 5

## 2015-01-01 MED ORDER — LEVOTHYROXINE SODIUM 50 MCG PO TABS: 50.0000 ug | ORAL_TABLET | Freq: Every day | ORAL | Status: DC

## 2015-01-01 MED ORDER — SERTRALINE HCL 100 MG PO TABS
200.0000 mg | ORAL_TABLET | Freq: Every day | ORAL | Status: DC
  Administered 2015-01-01: 200 mg via ORAL
  Filled 2015-01-01 (×3): qty 2

## 2015-01-01 MED ORDER — SODIUM CHLORIDE 0.9 % IV BOLUS (SEPSIS)
1000.0000 mL | Freq: Once | INTRAVENOUS | Status: AC
Start: 2015-01-01 — End: 2015-01-01
  Administered 2015-01-01: 1000 mL via INTRAVENOUS

## 2015-01-01 MED ORDER — OXYCODONE-ACETAMINOPHEN 5-325 MG PO TABS: 1.0000 | ORAL_TABLET | ORAL | Status: DC | PRN

## 2015-01-01 MED ORDER — ALBUTEROL SULFATE HFA 108 (90 BASE) MCG/ACT IN AERS
2.0000 | INHALATION_SPRAY | Freq: Four times a day (QID) | RESPIRATORY_TRACT | Status: DC | PRN
  Administered 2015-01-01: 2 via RESPIRATORY_TRACT
  Filled 2015-01-01: qty 6.7

## 2015-01-01 MED ORDER — BENZONATATE 100 MG PO CAPS: 200.0000 mg | ORAL_CAPSULE | Freq: Three times a day (TID) | ORAL | Status: DC | PRN

## 2015-01-01 MED ORDER — CHLORZOXAZONE 500 MG PO TABS
500.0000 mg | ORAL_TABLET | Freq: Four times a day (QID) | ORAL | Status: DC | PRN
  Administered 2015-01-01: 500 mg via ORAL
  Filled 2015-01-01: qty 1

## 2015-01-01 MED ORDER — ACETAMINOPHEN 325 MG PO TABS
650.0000 mg | ORAL_TABLET | Freq: Once | ORAL | Status: AC
  Administered 2015-01-01: 650 mg via ORAL
  Filled 2015-01-01: qty 2

## 2015-01-01 MED ORDER — ONDANSETRON 4 MG PO TBDP: 4.0000 mg | ORAL_TABLET | Freq: Three times a day (TID) | ORAL | Status: DC | PRN

## 2015-01-01 MED ORDER — ATENOLOL 25 MG PO TABS
50.0000 mg | ORAL_TABLET | Freq: Every day | ORAL | Status: DC
  Administered 2015-01-01: 50 mg via ORAL
  Filled 2015-01-01: qty 2

## 2015-01-01 MED ORDER — DM-GUAIFENESIN ER 30-600 MG PO TB12
1.0000 | ORAL_TABLET | Freq: Two times a day (BID) | ORAL | Status: DC
  Administered 2015-01-01: 1 via ORAL
  Filled 2015-01-01: qty 1

## 2015-01-01 MED ORDER — INSULIN ASPART 100 UNIT/ML ~~LOC~~ SOLN
5.0000 [IU] | Freq: Once | SUBCUTANEOUS | Status: AC
  Administered 2015-01-01: 5 [IU] via SUBCUTANEOUS
  Filled 2015-01-01: qty 1

## 2015-01-01 NOTE — ED Notes (Signed)
EMS called to transport patient home

## 2015-01-01 NOTE — Discharge Instructions (Signed)
You were seen in the emergency department for evaluation for nursing home placement. You have decided to leave the hospital and go home. Here labs today were normal except mildly elevated blood glucose. I recommend you have this followed up by your primary care provider. If you need further assistance at home please follow-up with your primary care doctor.    Chest Pain (Nonspecific) It is often hard to give a specific diagnosis for the cause of chest pain. There is always a chance that your pain could be related to something serious, such as a heart attack or a blood clot in the lungs. You need to follow up with your health care provider for further evaluation. CAUSES   Heartburn.  Pneumonia or bronchitis.  Anxiety or stress.  Inflammation around your heart (pericarditis) or lung (pleuritis or pleurisy).  A blood clot in the lung.  A collapsed lung (pneumothorax). It can develop suddenly on its own (spontaneous pneumothorax) or from trauma to the chest.  Shingles infection (herpes zoster virus). The chest wall is composed of bones, muscles, and cartilage. Any of these can be the source of the pain.  The bones can be bruised by injury.  The muscles or cartilage can be strained by coughing or overwork.  The cartilage can be affected by inflammation and become sore (costochondritis). DIAGNOSIS  Lab tests or other studies may be needed to find the cause of your pain. Your health care provider may have you take a test called an ambulatory electrocardiogram (ECG). An ECG records your heartbeat patterns over a 24-hour period. You may also have other tests, such as:  Transthoracic echocardiogram (TTE). During echocardiography, sound waves are used to evaluate how blood flows through your heart.  Transesophageal echocardiogram (TEE).  Cardiac monitoring. This allows your health care provider to monitor your heart rate and rhythm in real time.  Holter monitor. This is a portable device  that records your heartbeat and can help diagnose heart arrhythmias. It allows your health care provider to track your heart activity for several days, if needed.  Stress tests by exercise or by giving medicine that makes the heart beat faster. TREATMENT   Treatment depends on what may be causing your chest pain. Treatment may include:  Acid blockers for heartburn.  Anti-inflammatory medicine.  Pain medicine for inflammatory conditions.  Antibiotics if an infection is present.  You may be advised to change lifestyle habits. This includes stopping smoking and avoiding alcohol, caffeine, and chocolate.  You may be advised to keep your head raised (elevated) when sleeping. This reduces the chance of acid going backward from your stomach into your esophagus. Most of the time, nonspecific chest pain will improve within 2-3 days with rest and mild pain medicine.  HOME CARE INSTRUCTIONS   If antibiotics were prescribed, take them as directed. Finish them even if you start to feel better.  For the next few days, avoid physical activities that bring on chest pain. Continue physical activities as directed.  Do not use any tobacco products, including cigarettes, chewing tobacco, or electronic cigarettes.  Avoid drinking alcohol.  Only take medicine as directed by your health care provider.  Follow your health care provider's suggestions for further testing if your chest pain does not go away.  Keep any follow-up appointments you made. If you do not go to an appointment, you could develop lasting (chronic) problems with pain. If there is any problem keeping an appointment, call to reschedule. SEEK MEDICAL CARE IF:   Your chest  pain does not go away, even after treatment.  You have a rash with blisters on your chest.  You have a fever. SEEK IMMEDIATE MEDICAL CARE IF:   You have increased chest pain or pain that spreads to your arm, neck, jaw, back, or abdomen.  You have shortness of  breath.  You have an increasing cough, or you cough up blood.  You have severe back or abdominal pain.  You feel nauseous or vomit.  You have severe weakness.  You faint.  You have chills. This is an emergency. Do not wait to see if the pain will go away. Get medical help at once. Call your local emergency services (911 in [place] Do not drive yourself to the hospital. MAKE SURE YOU:   Understand these instructions.  Will watch your condition.  Will get help right away if you are not doing well or get worse. Document Released: 09/25/2005 Document Revised: 12/21/2013 Document Reviewed: 07/21/2008 [facility] Patient Information 2015 [place], [place]. This information is not intended to replace advice given to you by your health care provider. Make sure you discuss any questions you have with your health care provider.  Hyperglycemia Hyperglycemia occurs when the glucose (sugar) in your blood is too high. Hyperglycemia can happen for many reasons, but it most often happens to people who do not know they have diabetes or are not managing their diabetes properly.  CAUSES  Whether you have diabetes or not, there are other causes of hyperglycemia. Hyperglycemia can occur when you have diabetes, but it can also occur in other situations that you might not be as aware of, such as: Diabetes  If you have diabetes and are having problems controlling your blood glucose, hyperglycemia could occur because of some of the following reasons:  Not following your meal plan.  Not taking your diabetes medications or not taking it properly.  Exercising less or doing less activity than you normally do.  Being sick. Pre-diabetes  This cannot be ignored. Before people develop Type 2 diabetes, they almost always have "pre-diabetes." This is when your blood glucose levels are higher than normal, but not yet high enough to be diagnosed as diabetes. Research has shown that some long-term damage to the body,  especially the heart and circulatory system, may already be occurring during pre-diabetes. If you take action to manage your blood glucose when you have pre-diabetes, you may delay or prevent Type 2 diabetes from developing. Stress  If you have diabetes, you may be "diet" controlled or on oral medications or insulin to control your diabetes. However, you may find that your blood glucose is higher than usual in the hospital whether you have diabetes or not. This is often referred to as "stress hyperglycemia." Stress can elevate your blood glucose. This happens because of hormones put out by the body during times of stress. If stress has been the cause of your high blood glucose, it can be followed regularly by your caregiver. That way he/she can make sure your hyperglycemia does not continue to get worse or progress to diabetes. Steroids  Steroids are medications that act on the infection fighting system (immune system) to block inflammation or infection. One side effect can be a rise in blood glucose. Most people can produce enough extra insulin to allow for this rise, but for those who cannot, steroids make blood glucose levels go even higher. It is not unusual for steroid treatments to "uncover" diabetes that is developing. It is not always possible to determine if  the hyperglycemia will go away after the steroids are stopped. A special blood test called an A1c is sometimes done to determine if your blood glucose was elevated before the steroids were started. SYMPTOMS  Thirsty.  Frequent urination.  Dry mouth.  Blurred vision.  Tired or fatigue.  Weakness.  Sleepy.  Tingling in feet or leg. DIAGNOSIS  Diagnosis is made by monitoring blood glucose in one or all of the following ways:  A1c test. This is a chemical found in your blood.  Fingerstick blood glucose monitoring.  Laboratory results. TREATMENT  First, knowing the cause of the hyperglycemia is important before the  hyperglycemia can be treated. Treatment may include, but is not be limited to:  Education.  Change or adjustment in medications.  Change or adjustment in meal plan.  Treatment for an illness, infection, etc.  More frequent blood glucose monitoring.  Change in exercise plan.  Decreasing or stopping steroids.  Lifestyle changes. HOME CARE INSTRUCTIONS   Test your blood glucose as directed.  Exercise regularly. Your caregiver will give you instructions about exercise. Pre-diabetes or diabetes which comes on with stress is helped by exercising.  Eat wholesome, balanced meals. Eat often and at regular, fixed times. Your caregiver or nutritionist will give you a meal plan to guide your sugar intake.  Being at an ideal weight is important. If needed, losing as little as 10 to 15 pounds may help improve blood glucose levels. SEEK MEDICAL CARE IF:   You have questions about medicine, activity, or diet.  You continue to have symptoms (problems such as increased thirst, urination, or weight gain). SEEK IMMEDIATE MEDICAL CARE IF:   You are vomiting or have diarrhea.  Your breath smells fruity.  You are breathing faster or slower.  You are very sleepy or incoherent.  You have numbness, tingling, or pain in your feet or hands.  You have chest pain.  Your symptoms get worse even though you have been following your caregiver's orders.  If you have any other questions or concerns. Document Released: 06/11/2001 Document Revised: 03/09/2012 Document Reviewed: 04/13/2012 [facility] Patient Information 2015 [place], [place]. This information is not intended to replace advice given to you by your health care provider. Make sure you discuss any questions you have with your health care provider.   Urinary Tract Infection Urinary tract infections (UTIs) can develop anywhere along your urinary tract. Your urinary tract is your body's drainage system for removing wastes and extra water. Your  urinary tract includes two kidneys, two ureters, a bladder, and a urethra. Your kidneys are a pair of bean-shaped organs. Each kidney is about the size of your fist. They are located below your ribs, one on each side of your spine. CAUSES Infections are caused by microbes, which are microscopic organisms, including fungi, viruses, and bacteria. These organisms are so small that they can only be seen through a microscope. Bacteria are the microbes that most commonly cause UTIs. SYMPTOMS  Symptoms of UTIs may vary by age and gender of the patient and by the location of the infection. Symptoms in young women typically include a frequent and intense urge to urinate and a painful, burning feeling in the bladder or urethra during urination. Older women and men are more likely to be tired, shaky, and weak and have muscle aches and abdominal pain. A fever may mean the infection is in your kidneys. Other symptoms of a kidney infection include pain in your back or sides below the ribs, nausea, and vomiting.  DIAGNOSIS To diagnose a UTI, your caregiver will ask you about your symptoms. Your caregiver also will ask to provide a urine sample. The urine sample will be tested for bacteria and white blood cells. White blood cells are made by your body to help fight infection. TREATMENT  Typically, UTIs can be treated with medication. Because most UTIs are caused by a bacterial infection, they usually can be treated with the use of antibiotics. The choice of antibiotic and length of treatment depend on your symptoms and the type of bacteria causing your infection. HOME CARE INSTRUCTIONS  If you were prescribed antibiotics, take them exactly as your caregiver instructs you. Finish the medication even if you feel better after you have only taken some of the medication.  Drink enough water and fluids to keep your urine clear or pale yellow.  Avoid caffeine, tea, and carbonated beverages. They tend to irritate your  bladder.  Empty your bladder often. Avoid holding urine for long periods of time.  Empty your bladder before and after sexual intercourse.  After a bowel movement, women should cleanse from front to back. Use each tissue only once. SEEK MEDICAL CARE IF:   You have back pain.  You develop a fever.  Your symptoms do not begin to resolve within 3 days. SEEK IMMEDIATE MEDICAL CARE IF:   You have severe back pain or lower abdominal pain.  You develop chills.  You have nausea or vomiting.  You have continued burning or discomfort with urination. MAKE SURE YOU:   Understand these instructions.  Will watch your condition.  Will get help right away if you are not doing well or get worse. Document Released: 09/25/2005 Document Revised: 06/16/2012 Document Reviewed: 01/24/2012 [facility] Patient Information 2015 [place], [place]. This information is not intended to replace advice given to you by your health care provider. Make sure you discuss any questions you have with your health care provider.

## 2015-01-01 NOTE — ED Notes (Signed)
Pt comes in after calling 911 this morning. She was suppose to have a sitter with her last night. That sitter did not come by and pt was left alone all night. Pt has defecated on herself and urinated on herself. Pt denies any loss of consciousness but states she "feels dehydrated."  Pt has recent history of shoulder surgery on right shoulder. Pt denies any new pain. States she signed herself out of [employer] 2 days ago and they didn't discharger her with her pain medication. Pt was recently dx with diabetes but is not taking medications for it.

## 2015-01-01 NOTE — ED Notes (Signed)
MD at bedside. 

## 2015-01-01 NOTE — ED Notes (Signed)
MD made aware of CBG 356

## 2015-01-01 NOTE — ED Provider Notes (Addendum)
This chart was scribed for [name], DO by [name], ED Scribe. This patient was seen in room APA11/APA11   TIME SEEN: 68  CHIEF COMPLAINT: shoulder pain  HPI: [name] with history of new-onset diabetes is a 69 y.o. female with history of hypertension, 3 prior strokes, COPD or lung cancer status post radiation who presents to the Emergency Department because she does not feel safe home. She states she was recently discharged from the hospital (12/10/14-12/14/14) after right shoulder surgery and placed at [facility], but she signed herself out from [facility] 2 days ago and states she does not want to go back there. She states her home health aid did not come in she could not get in touch with any family members, so she called 911 today. She also notes that her "heart hurts" but states she has had similar symptoms before when upset.  She is complaining of right shoulder pain that has been present since surgery. No fevers, cough, shortness of breath, vomiting or diarrhea, numbness or focal weakness. She states she feels weak all over.   ROS: See HPI Constitutional: no fever  Eyes: no drainage  ENT: no runny nose   Cardiovascular: no chest pain Resp: no SOB  GI: no vomiting GU: no dysuria Integumentary: no rash  Allergy: no hives  Musculoskeletal: no leg swelling , lung cancer status post radiation Neurological: no slurred speech ROS otherwise negative  PAST MEDICAL HISTORY/PAST SURGICAL HISTORY:  Past Medical History  Diagnosis Date  . H/O: stroke 2012, 2000,     X3  . Hypertension   . Anxiety   . PTSD (post-traumatic stress disorder)   . Depression   . SVD (spontaneous vaginal delivery)     x 1  . Smoker   . Osteoarthritis     knees, hips hands,   . Shortness of breath   . Pain     SEVERE PAIN BACK, RIGHT HIP AND KNEES--HARRINGTON RODS AND CERVICAL PLATES--USES WALKER OR CANE WHEN AMBULATING - GOES TO A PAIN CLINIC-STATES SHE NEEDS RT HIP AND BOTH KNEES REPLACED. PT  STATES SHE WAS TOLD THE CEMENT AROUND THE HARRINGTON RODS IS CRACKED.  [name] Hot flashes, menopausal     SEVERE  . Complication of anesthesia 03/02/13    severe headache,vertigo postop  . COPD (chronic obstructive pulmonary disease)     stable  . 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 Lung cancer   . Status post radiation therapy within four to twelve weeks 10/18/13-12/06/13    lung ca  66Gy  . Family history of anesthesia complication     equipment failure caused problem, suffered a stroke & heartattack    MEDICATIONS:  Prior to Admission medications   Medication Sig Start Date End Date Taking? Authorizing Provider  Aclidinium Bromide (TUDORZA PRESSAIR) 400 MCG/ACT AEPB Inhale 1 puff into the lungs 2 (two) times daily. 11/10/14   Curt Bears, MD  albuterol (PROVENTIL) (2.5 MG/3ML) 0.083% nebulizer solution Take 2.5 mg by nebulization as needed for wheezing.    Historical Provider, MD  albuterol (VENTOLIN HFA) 108 (90 BASE) MCG/ACT inhaler Inhale 2 puffs into the lungs every 6 (six) hours as needed for wheezing or shortness of breath. 11/10/14   Curt Bears, MD  ALPRAZolam Duanne Moron) 1 MG tablet Take 1 tablet (1 mg total) by mouth every 6 (six) hours as needed for anxiety. 01/01/14   Kinnie Feil, MD  amoxicillin-clavulanate (AUGMENTIN) 875-125 MG per tablet Take 1 tablet by mouth  2 (two) times daily. 12/15/14   Debbe Odea, MD  aspirin EC 325 MG EC tablet Take 1 tablet (325 mg total) by mouth 2 (two) times daily. 12/14/14   Debbe Odea, MD  atenolol (TENORMIN) 50 MG tablet Take 1 tablet (50 mg total) by mouth daily. 11/25/13   Velvet Bathe, MD  benzonatate (TESSALON) 200 MG capsule Take 1 capsule (200 mg total) by mouth 3 (three) times daily as needed for cough. 11/15/14   Curt Bears, MD  chlorzoxazone (PARAFON) 500 MG tablet Take 500 mg by mouth 4 (four) times daily as needed for muscle spasms.    Historical Provider, MD  dextromethorphan-guaiFENesin (MUCINEX DM) 30-600 MG per 12 hr  tablet Take 1 tablet by mouth every 12 (twelve) hours.    Historical Provider, MD  docusate sodium 100 MG CAPS Take 100 mg by mouth 2 (two) times daily. 12/14/14   Debbe Odea, MD  doxycycline (VIBRA-TABS) 100 MG tablet Take 1 tablet (100 mg total) by mouth every 12 (twelve) hours. 12/15/14   Debbe Odea, MD  esomeprazole (NEXIUM) 40 MG capsule Take 40 mg by mouth daily as needed (for acid reflux).     Historical Provider, MD  levothyroxine (SYNTHROID) 50 MCG tablet Take 1 tablet (50 mcg total) by mouth daily before breakfast. 11/23/14   Curt Bears, MD  ondansetron (ZOFRAN ODT) 4 MG disintegrating tablet Take 1 tablet (4 mg total) by mouth every 8 (eight) hours as needed for nausea or vomiting. 04/14/14   Curt Bears, MD  oxyCODONE-acetaminophen (ROXICET) 5-325 MG per tablet Take 1-2 tablets by mouth every 4 (four) hours as needed for severe pain. 12/13/14   Grier Mitts, PA-C  promethazine (PHENERGAN) 25 MG tablet Take 25 mg by mouth every 6 (six) hours as needed for nausea or vomiting.    Historical Provider, MD  sertraline (ZOLOFT) 100 MG tablet Take 200 mg by mouth daily.     Historical Provider, MD  tiotropium (SPIRIVA) 18 MCG inhalation capsule Place 1 capsule (18 mcg total) into inhaler and inhale daily. 12/14/14   Debbe Odea, MD  trazodone (DESYREL) 300 MG tablet Take 300 mg by mouth at bedtime.    Historical Provider, MD    ALLERGIES:  Allergies  Allergen Reactions  . Carafate [Sucralfate] Nausea Only  . Prednisone     insomnia  . Nsaids     GI Upset  . Aspirin Other (See Comments)    Gi symptoms. Does NOT take ibuprofen or other NSAIDS  . Ibuprofen     Gi upset  . Dulera [Mometasone Furo-Formoterol Fum] Palpitations    Panting " my body was twisted inside and out"    SOCIAL HISTORY:  History  Substance Use Topics  . Smoking status: Light Tobacco Smoker -- 0.50 packs/day for 35 years    Types: Cigarettes  . Smokeless tobacco: Never Used     Comment: 4cigs  per day 02/17/13  . Alcohol Use: No    FAMILY HISTORY: Family History  Problem Relation Age of Onset  . Cancer Mother     COLON  . Heart disease Father   . Diabetes Maternal Aunt   . Cancer Maternal Grandfather     KIDNEY   . Diabetes Maternal Grandfather   . Hypertension Maternal Grandfather   . Heart disease Maternal Grandfather     EXAM: BP 156/87 mmHg  Pulse 84  Temp(Src) 98 F (36.7 C) (Oral)  Resp 20  Ht 5\' 6"  (1.676 m)  Wt 158 lb (71.668 kg)  BMI 25.51 kg/m2  SpO2 95% CONSTITUTIONAL: Alert and oriented and responds appropriately to questions. Well-appearing; well-nourished HEAD: Normocephalic EYES: Conjunctivae clear, PERRL ENT: normal nose; no rhinorrhea; moist mucous membranes; pharynx without lesions noted NECK: Supple, no meningismus, no LAD  CARD: RRR; S1 and S2 appreciated; no murmurs, no clicks, no rubs, no gallops RESP: Normal chest excursion without splinting or tachypnea; breath sounds clear and equal bilaterally; no wheezes, no rhonchi, no rales, no hypoxia or respiratory distress ABD/GI: Normal bowel sounds; non-distended; soft, non-tender, no rebound, no guarding BACK:  The back appears normal and is non-tender to palpation, there is no CVA tenderness EXT: Normal ROM in all joints; non-tender to palpation; no edema; normal capillary refill; no cyanosis    SKIN: Normal color for age and race; warm NEURO: Moves all extremities equally; Sensation to fine touch intact throughout; Cranial nerves 2-12 grossly intact PSYCH: The patient's mood and manner are appropriate. Grooming and personal hygiene are appropriate.  MEDICAL DECISION MAKING: Patient here for social issues. She denies any new acute complaints other than generalized weakness. We'll check basic labs, chest x-ray and urine. Will contact social work. Patient is requesting that I admit her to the hospital overnight but explained to patient that we only admit people that have medical reasons to stay in  the hospital. She is agreeable to placement in another nursing facility but states she will not go back to [facility] or a nursing home in [place] that she cannot remember the name of. She is requested that I contact her cousin Pam at [phone]. I have left a message with this cousin.  ED PROGRESS: 3:00 PM  Patient's labs are relatively unremarkable. She is hyperglycemic with a glucose of 370 but has a normal bicarbonate and anion gap. We'll give IV fluids. Troponin negative. EKG nonischemic. Urine does show trace hemoglobin and many bacteria. Will treat for possible urinary tract infection with Ceftriaxone. Culture pending. Chest x-ray shows no acute abnormality. Awaiting social work disposition (d/w SW at [phone]). Patient updated with plan.    4:15 PM  Spoke with Tywan SW.  Unfortunately no progress has been made as it is Sunday to get patient place. He will file an adult protective services report as well given concerns for safety. Does not appear that she has a safe home to go back to and there is no family that can care for her and we are unable to get in touch with any family.  Social worker reports that patient is within her 30 days to go back to a different nursing facility. They recommend keeping the patient in the emergency department in trying again tomorrow morning.  4:30 PM  Attempted to contact pt's son [name] at [phone].  Left message.    5:05 PM  Spoke with pt's son [name].  He reports that there is no family that can care for the patient her stay with her. He states that he works overnight 6 days a week. He agrees that he does not feel she is safe to care for herself at home. States that she was not discharged from [facility] with home health aides. He states the people that are coming to her house are her friends and that if she was to go home she would need a certified home health agency and round-the-clock clear. He agrees that she likely needs placement in a nursing  facility.    5:15 PM  Have explained to patient that she will have to be reevaluated  by social work tomorrow who will help with placement.  She now reports that she has a friend that is at her house nailed can watch her overnight and self only and home health agencies coming to her home tomorrow. She states she feels like she will be safe at home, more comfortable at home and is now refusing status day to work on placement. Have urged her to stay in the ED so that social worker can place her in a nursing facility but she states she feels she would rather be at home. She is oriented 3 and able to make this decision.  Updated patient's son.  5:45 PM  [name] CBG is now 320. Second troponin negative. Will discharge home.    EKG Interpretation  Date/Time:  Sunday January 01 2015 13:36:23 EST Ventricular Rate:  84 PR Interval:  147 QRS Duration: 92 QT Interval:  410 QTC Calculation: 485 R Axis:   68 Text Interpretation:  Sinus rhythm No significant change since last tracing Confirmed by Tanya Crothers,  DO, Khasir Woodrome [phone]) on 01/01/2015 2:00:04 PM        I personally performed the services described in this documentation, which was scribed in my presence. The recorded information has been reviewed and is accurate.   [place], DO 01/01/15 [place], DO 01/01/15 1746

## 2015-01-01 NOTE — ED Notes (Signed)
[name], patients son: [phone]

## 2015-01-03 LAB — URINE CULTURE

## 2015-01-06 ENCOUNTER — Other Ambulatory Visit: Payer: Self-pay | Admitting: Orthopedic Surgery

## 2015-01-07 ENCOUNTER — Inpatient Hospital Stay (HOSPITAL_COMMUNITY)
Admission: EM | Admit: 2015-01-07 | Discharge: 2015-01-11 | DRG: 638 | Disposition: A | Discharge status: Home / Self Care | Payer: Medicare Other | Attending: Internal Medicine | Admitting: Internal Medicine

## 2015-01-07 ENCOUNTER — Encounter (HOSPITAL_COMMUNITY): Payer: Self-pay | Admitting: Emergency Medicine

## 2015-01-07 DIAGNOSIS — E1010 Type 1 diabetes mellitus with ketoacidosis without coma: Secondary | ICD-10-CM

## 2015-01-07 DIAGNOSIS — Z923 Personal history of irradiation: Secondary | ICD-10-CM | POA: Diagnosis not present

## 2015-01-07 DIAGNOSIS — E1310 Other specified diabetes mellitus with ketoacidosis without coma: Principal | ICD-10-CM | POA: Diagnosis present

## 2015-01-07 DIAGNOSIS — Z96612 Presence of left artificial shoulder joint: Secondary | ICD-10-CM | POA: Diagnosis not present

## 2015-01-07 DIAGNOSIS — Z981 Arthrodesis status: Secondary | ICD-10-CM

## 2015-01-07 DIAGNOSIS — Z8673 Personal history of transient ischemic attack (TIA), and cerebral infarction without residual deficits: Secondary | ICD-10-CM | POA: Diagnosis not present

## 2015-01-07 DIAGNOSIS — E222 Syndrome of inappropriate secretion of antidiuretic hormone: Secondary | ICD-10-CM | POA: Diagnosis not present

## 2015-01-07 DIAGNOSIS — J449 Chronic obstructive pulmonary disease, unspecified: Secondary | ICD-10-CM | POA: Diagnosis not present

## 2015-01-07 DIAGNOSIS — I10 Essential (primary) hypertension: Secondary | ICD-10-CM | POA: Diagnosis not present

## 2015-01-07 DIAGNOSIS — E860 Dehydration: Secondary | ICD-10-CM | POA: Diagnosis not present

## 2015-01-07 DIAGNOSIS — E1110 Type 2 diabetes mellitus with ketoacidosis without coma: Secondary | ICD-10-CM | POA: Diagnosis present

## 2015-01-07 DIAGNOSIS — F329 Major depressive disorder, single episode, unspecified: Secondary | ICD-10-CM | POA: Diagnosis present

## 2015-01-07 DIAGNOSIS — G8929 Other chronic pain: Secondary | ICD-10-CM | POA: Diagnosis not present

## 2015-01-07 DIAGNOSIS — M1990 Unspecified osteoarthritis, unspecified site: Secondary | ICD-10-CM | POA: Diagnosis not present

## 2015-01-07 DIAGNOSIS — C3490 Malignant neoplasm of unspecified part of unspecified bronchus or lung: Secondary | ICD-10-CM | POA: Diagnosis not present

## 2015-01-07 DIAGNOSIS — R739 Hyperglycemia, unspecified: Secondary | ICD-10-CM | POA: Diagnosis not present

## 2015-01-07 DIAGNOSIS — N390 Urinary tract infection, site not specified: Secondary | ICD-10-CM | POA: Diagnosis present

## 2015-01-07 DIAGNOSIS — F4310 Post-traumatic stress disorder, unspecified: Secondary | ICD-10-CM | POA: Diagnosis present

## 2015-01-07 DIAGNOSIS — Z79891 Long term (current) use of opiate analgesic: Secondary | ICD-10-CM | POA: Diagnosis not present

## 2015-01-07 DIAGNOSIS — F419 Anxiety disorder, unspecified: Secondary | ICD-10-CM | POA: Diagnosis not present

## 2015-01-07 DIAGNOSIS — Z7982 Long term (current) use of aspirin: Secondary | ICD-10-CM | POA: Diagnosis not present

## 2015-01-07 DIAGNOSIS — E871 Hypo-osmolality and hyponatremia: Secondary | ICD-10-CM | POA: Diagnosis present

## 2015-01-07 LAB — BASIC METABOLIC PANEL
Anion gap: 24 — ABNORMAL HIGH (ref 5–15)
Anion gap: 19 — ABNORMAL HIGH (ref 5–15)
BUN: 10 mg/dL (ref 6–23)
BUN: 10 mg/dL (ref 6–23)
CHLORIDE: 99 meq/L (ref 96–112)
CO2: 10 mmol/L — CL (ref 19–32)
CO2: 13 mmol/L — ABNORMAL LOW (ref 19–32)
CREATININE: 1.2 mg/dL — AB (ref 0.50–1.10)
Calcium: 8.4 mg/dL (ref 8.4–10.5)
Calcium: 8.4 mg/dL (ref 8.4–10.5)
Chloride: 93 mEq/L — ABNORMAL LOW (ref 96–112)
Creatinine, Ser: 1.23 mg/dL — ABNORMAL HIGH (ref 0.50–1.10)
GFR calc Af Amer: 51 mL/min — ABNORMAL LOW (ref >90)
GFR calc Af Amer: 53 mL/min — ABNORMAL LOW (ref >90)
GFR calc non Af Amer: 44 mL/min — ABNORMAL LOW (ref >90)
GFR calc non Af Amer: 46 mL/min — ABNORMAL LOW (ref >90)
GLUCOSE: 581 mg/dL — AB (ref 70–99)
Glucose, Bld: 787 mg/dL (ref 70–99)
POTASSIUM: 3.8 mmol/L (ref 3.5–5.1)
Potassium: 4.8 mmol/L (ref 3.5–5.1)
Sodium: 127 mmol/L — ABNORMAL LOW (ref 135–145)
Sodium: 131 mmol/L — ABNORMAL LOW (ref 135–145)

## 2015-01-07 LAB — URINE MICROSCOPIC-ADD ON

## 2015-01-07 LAB — URINALYSIS, ROUTINE W REFLEX MICROSCOPIC
BILIRUBIN URINE: NEGATIVE
Glucose, UA: >1000 mg/dL — AB
Ketones, ur: >80 mg/dL — AB
Leukocytes, UA: NEGATIVE
NITRITE: NEGATIVE
PH: 5.5 (ref 5.0–8.0)
Specific Gravity, Urine: 1.02 (ref 1.005–1.030)
Urobilinogen, UA: 0.2 mg/dL (ref 0.0–1.0)

## 2015-01-07 LAB — CBG MONITORING, ED
GLUCOSE-CAPILLARY: 595 mg/dL — AB (ref 70–99)
GLUCOSE-CAPILLARY: 503 mg/dL — AB (ref 70–99)

## 2015-01-07 LAB — CBC WITH DIFFERENTIAL/PLATELET
BASOS ABS: 0 10*3/uL (ref 0.0–0.1)
Basophils Relative: 0 % (ref 0–1)
Eosinophils Absolute: 0 10*3/uL (ref 0.0–0.7)
Eosinophils Relative: 0 % (ref 0–5)
HEMATOCRIT: 37.7 % (ref 36.0–46.0)
Hemoglobin: 12.1 g/dL (ref 12.0–15.0)
Lymphocytes Relative: 17 % (ref 12–46)
Lymphs Abs: 0.9 10*3/uL (ref 0.7–4.0)
MCH: 33.2 pg (ref 26.0–34.0)
MCHC: 32.1 g/dL (ref 30.0–36.0)
MCV: 103.3 fL — ABNORMAL HIGH (ref 78.0–100.0)
MONO ABS: 0.3 10*3/uL (ref 0.1–1.0)
Monocytes Relative: 6 % (ref 3–12)
Neutro Abs: 4.1 10*3/uL (ref 1.7–7.7)
Neutrophils Relative %: 77 % (ref 43–77)
Platelets: 195 10*3/uL (ref 150–400)
RBC: 3.65 MIL/uL — ABNORMAL LOW (ref 3.87–5.11)
RDW: 15.1 % (ref 11.5–15.5)
WBC: 5.4 10*3/uL (ref 4.0–10.5)

## 2015-01-07 MED ORDER — DEXTROSE-NACL 5-0.45 % IV SOLN: INTRAVENOUS | Status: DC

## 2015-01-07 MED ORDER — SODIUM CHLORIDE 0.9 % IV SOLN
INTRAVENOUS | Status: DC
  Administered 2015-01-07: 5.4 [IU]/h via INTRAVENOUS
  Filled 2015-01-07: qty 2.5

## 2015-01-07 MED ORDER — POTASSIUM CHLORIDE 10 MEQ/100ML IV SOLN
10.0000 meq | INTRAVENOUS | Status: AC
  Administered 2015-01-08 (×2): 10 meq via INTRAVENOUS
  Filled 2015-01-07 (×2): qty 100

## 2015-01-07 MED ORDER — ONDANSETRON HCL 4 MG/2ML IJ SOLN
4.0000 mg | Freq: Once | INTRAMUSCULAR | Status: AC
  Administered 2015-01-07: 4 mg via INTRAMUSCULAR
  Filled 2015-01-07: qty 2

## 2015-01-07 MED ORDER — LORAZEPAM 2 MG/ML IJ SOLN
1.0000 mg | Freq: Once | INTRAMUSCULAR | Status: AC
  Administered 2015-01-07: 1 mg via INTRAVENOUS
  Filled 2015-01-07: qty 1

## 2015-01-07 MED ORDER — SODIUM CHLORIDE 0.9 % IV BOLUS (SEPSIS)
1000.0000 mL | Freq: Once | INTRAVENOUS | Status: AC
  Administered 2015-01-07: 1000 mL via INTRAVENOUS

## 2015-01-07 MED ORDER — MORPHINE SULFATE 4 MG/ML IJ SOLN
4.0000 mg | Freq: Once | INTRAMUSCULAR | Status: AC
  Administered 2015-01-07: 4 mg via INTRAVENOUS
  Filled 2015-01-07: qty 1

## 2015-01-07 NOTE — H&P (Signed)
History and Physical  [name]:[id number] DOB: Apr 24, 1946 DOA: 01/07/2015  Referring physician: Dr Lacinda Axon, ED physician PCP: No PCP Per Patient   Chief Complaint: Not feeling well  HPI: [name] is a 69 y.o. 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 Patient was evaluated in the emergency department for social issueson on 01/01/2015. She was diagnosed with a UTI and was noted to have blood sugars in the 300s, although had a normal anion gap and bicarbonate. The patient felt worse over the past couple of days and came in today for evaluation. The patient's blood sugars was noted to be greater than 700. Patient was started on insulin drip with IV fluids and was asked to evaluate the patient. Currently the patient denies any other symptoms other than fatigue and generalized not feeling well.  Review of Systems:   Pt complains of chronic back and hip pain.  Pt denies any fevers, chills, nausea, vomiting, diarrhea, constipation, abdominal pain, chest pain, shortness of breath, effusion, tremors, presyncope.  Review of systems are otherwise negative  Past Medical History  Diagnosis Date  . H/O: stroke 2012, 2000,     X3  . Hypertension   . Anxiety   . PTSD (post-traumatic stress disorder)   . Depression   . SVD (spontaneous vaginal delivery)     x 1  . Smoker   . Osteoarthritis     knees, hips hands,   . Shortness of breath   . Pain     SEVERE PAIN BACK, RIGHT HIP AND KNEES--HARRINGTON RODS AND CERVICAL PLATES--USES WALKER OR CANE WHEN AMBULATING - GOES TO A PAIN CLINIC-STATES SHE NEEDS RT HIP AND BOTH KNEES REPLACED. PT STATES SHE WAS TOLD THE CEMENT AROUND THE HARRINGTON RODS IS CRACKED.  [name] Hot flashes, menopausal     SEVERE  . Complication of anesthesia 03/02/13    severe headache,vertigo postop  . COPD (chronic obstructive pulmonary  disease)     stable  . 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 Lung cancer   . Status post radiation therapy within four to twelve weeks 10/18/13-12/06/13    lung ca  66Gy  . Family history of anesthesia complication     equipment failure caused problem, suffered a stroke & heartattack   Past Surgical History  Procedure Laterality Date  . Carpal tunnel release      BILATERAL  . Nasal sinus surgery    . Lumbar fusion      L4-S1  . Cervical fusion    . Hernia repair    . Salpingoophorectomy Right   . Vaginal hysterectomy      TAH w/ ovary removal  . Transthoracic echocardiogram  12-03-2011    LVSF NORMAL/ EF 60-65%  . Vulvectomy N/A 03/02/2013    Procedure: WIDE LOCAL EXCISION VULVAR;  Surgeon: Imagene Gurney A. Alycia Rossetti, MD;  Location: [facility];  Service: Gynecology;  Laterality: N/A;  . Co2 laser application N/A 3/00/9233    Procedure: LASER APPLICATION OF THE VULVA;  Surgeon: Janie Morning, MD;  Location: [facility];  Service: Gynecology;  Laterality: N/A;  . Video bronchoscopy Bilateral 09/23/2013    Procedure: VIDEO BRONCHOSCOPY WITHOUT FLUORO;  Surgeon: Tanda Rockers, MD;  Location: [facility];  Service: Cardiopulmonary;  Laterality: Bilateral;  . Hemiarthroplasty shoulder fracture Left 05/25/2014    DR CHANDLER  . Reverse shoulder arthroplasty Left 05/24/2014  Procedure:   Hemi-Arthroplasty  Left Shoulder;  Surgeon: Nita Sells, MD;  Location: [place] [facility];  Service: Orthopedics;  Laterality: Left;  . Orif humerus fracture Right 12/12/2014    Procedure: OPEN REDUCTION INTERNAL FIXATION (ORIF) PROXIMAL HUMERUS FRACTURE;  Surgeon: Nita Sells, MD;  Location: [facility];  Service: Orthopedics;  Laterality: Right;   Social History:  reports that she has been smoking Cigarettes.  She has a 17.5 pack-year smoking history. She has never used smokeless tobacco. She reports that she does not drink alcohol or use illicit drugs.   Allergies  Allergen Reactions  .  Carafate [Sucralfate] Nausea Only  . Prednisone Other (See Comments)    insomnia  . Nsaids     GI Upset  . Aspirin Other (See Comments)    Gi symptoms. Does NOT take ibuprofen or other NSAIDS  . Ibuprofen     Gi upset  . Dulera [Mometasone Furo-Formoterol Fum] Palpitations    Panting " my body was twisted inside and out"    Family History  Problem Relation Age of Onset  . Cancer Mother     COLON  . Heart disease Father   . Diabetes Maternal Aunt   . Cancer Maternal Grandfather     KIDNEY   . Diabetes Maternal Grandfather   . Hypertension Maternal Grandfather   . Heart disease Maternal Grandfather      Prior to Admission medications   Medication Sig Start Date End Date Taking? Authorizing Provider  Aclidinium Bromide (TUDORZA PRESSAIR) 400 MCG/ACT AEPB Inhale 1 puff into the lungs 2 (two) times daily. 11/10/14  Yes Curt Bears, MD  albuterol (PROVENTIL) (2.5 MG/3ML) 0.083% nebulizer solution Take 2.5 mg by nebulization as needed for wheezing.   Yes Historical Provider, MD  albuterol (VENTOLIN HFA) 108 (90 BASE) MCG/ACT inhaler Inhale 2 puffs into the lungs every 6 (six) hours as needed for wheezing or shortness of breath. 11/10/14  Yes Curt Bears, MD  alprazolam Duanne Moron) 2 MG tablet Take 2 mg by mouth 3 (three) times daily as needed for sleep or anxiety.   Yes Historical Provider, MD  atenolol (TENORMIN) 50 MG tablet Take 1 tablet (50 mg total) by mouth daily. 11/25/13  Yes Velvet Bathe, MD  benzonatate (TESSALON) 200 MG capsule Take 1 capsule (200 mg total) by mouth 3 (three) times daily as needed for cough. 11/15/14  Yes Curt Bears, MD  cephALEXin (KEFLEX) 500 MG capsule Take 1 capsule (500 mg total) by mouth 2 (two) times daily. 01/01/15  Yes Kristen N Ward, DO  chlorzoxazone (PARAFON) 500 MG tablet Take 500 mg by mouth 4 (four) times daily as needed for muscle spasms.   Yes Historical Provider, MD  dextromethorphan-guaiFENesin (MUCINEX DM) 30-600 MG per 12 hr tablet  Take 1 tablet by mouth every 12 (twelve) hours.   Yes Historical Provider, MD  esomeprazole (NEXIUM) 40 MG capsule Take 40 mg by mouth daily.    Yes Historical Provider, MD  levothyroxine (SYNTHROID) 50 MCG tablet Take 1 tablet (50 mcg total) by mouth daily before breakfast. 11/23/14  Yes Curt Bears, MD  ondansetron (ZOFRAN ODT) 4 MG disintegrating tablet Take 1 tablet (4 mg total) by mouth every 8 (eight) hours as needed for nausea or vomiting. 04/14/14  Yes Curt Bears, MD  oxyCODONE-acetaminophen (ROXICET) 5-325 MG per tablet Take 1-2 tablets by mouth every 4 (four) hours as needed for severe pain. 12/13/14  Yes Grier Mitts, PA-C  sertraline (ZOLOFT) 100 MG tablet Take 200 mg by mouth  daily.    Yes Historical Provider, MD  trazodone (DESYREL) 300 MG tablet Take 300 mg by mouth at bedtime.   Yes Historical Provider, MD  ALPRAZolam Duanne Moron) 1 MG tablet Take 1 tablet (1 mg total) by mouth every 6 (six) hours as needed for anxiety. Patient not taking: Reported on 01/01/2015 01/01/14   Kinnie Feil, MD  amoxicillin-clavulanate (AUGMENTIN) 875-125 MG per tablet Take 1 tablet by mouth 2 (two) times daily. Patient not taking: Reported on 01/01/2015 12/15/14   Debbe Odea, MD  aspirin EC 325 MG EC tablet Take 1 tablet (325 mg total) by mouth 2 (two) times daily. Patient not taking: Reported on 01/01/2015 12/14/14   Debbe Odea, MD  docusate sodium 100 MG CAPS Take 100 mg by mouth 2 (two) times daily. Patient not taking: Reported on 01/01/2015 12/14/14   Debbe Odea, MD  doxycycline (VIBRA-TABS) 100 MG tablet Take 1 tablet (100 mg total) by mouth every 12 (twelve) hours. Patient not taking: Reported on 01/01/2015 12/15/14   Debbe Odea, MD  tiotropium (SPIRIVA) 18 MCG inhalation capsule Place 1 capsule (18 mcg total) into inhaler and inhale daily. Patient not taking: Reported on 01/01/2015 12/14/14   Debbe Odea, MD    Physical Exam: BP 129/70 mmHg  Pulse 97  Temp(Src) 98.5 F (36.9 C)  (Oral)  Resp 23  Ht 5\' 6"  (1.676 m)  Wt 72.576 kg (160 lb)  BMI 25.84 kg/m2  SpO2 100%  General: Elderly Caucasian female. Appears older than her stated age. Awake and alert and oriented x3. No acute cardiopulmonary distress.  Eyes: Pupils equal, round, reactive to light. Extraocular muscles are intact. Sclerae anicteric and noninjected.  ZCH:YIFOY mucosal membranes. No mucosal lesions.  Neck: Neck supple without lymphadenopathy. No carotid bruits. No masses palpated.  Cardiovascular: Regular rate with normal S1-S2 sounds. No murmurs, rubs, gallops auscultated. No JVD.  Respiratory: Good respiratory effort with no wheezes, rales, rhonchi. Lungs clear to auscultation bilaterally.  Abdomen: Soft, nontender, nondistended. Active bowel sounds. No masses or hepatosplenomegaly  Skin: Dry, warm to touch. 2+ dorsalis pedis and radial pulses. Musculoskeletal: No calf or leg pain. All major joints not erythematous nontender.  Psychiatric: Intact judgment and insight.  Neurologic: No focal neurological deficits. Cranial nerves II through XII are grossly intact.           Labs on Admission:  Basic Metabolic Panel:  Recent Labs Lab 01/01/15 1337 01/07/15 2037  NA 131* 127*  K 3.7 4.8  CL 95* 93*  CO2 26 10*  GLUCOSE 370* 787*  BUN 7 10  CREATININE 0.67 1.23*  CALCIUM 8.3* 8.4   Liver Function Tests: No results for input(s): AST, ALT, ALKPHOS, BILITOT, PROT, ALBUMIN in the last 168 hours. No results for input(s): LIPASE, AMYLASE in the last 168 hours. No results for input(s): AMMONIA in the last 168 hours. CBC:  Recent Labs Lab 01/01/15 1337 01/07/15 1928  WBC 3.6* 5.4  NEUTROABS 2.5 4.1  HGB 10.7* 12.1  HCT 31.7* 37.7  MCV 98.8 103.3*  PLT 157 195   Cardiac Enzymes:  Recent Labs Lab 01/01/15 1337 01/01/15 1654  CKTOTAL 129  --   TROPONINI <0.03 <0.03    BNP (last 3 results) No results for input(s): PROBNP in the last 8760 hours. CBG:  Recent Labs Lab  01/01/15 1621 01/01/15 1734 01/07/15 1805 01/07/15 2047 01/07/15 2146  GLUCAP 356* 320* >600* >600* 595*    Radiological Exams on Admission: No results found.   Assessment/Plan Present on Admission:  .  DKA, type 2, not at goal . Hyponatremia . 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 #5 chronic pain with opiate use #6 hypertension  We'll transfer to [facility] due to the lack of stepdown beds here Continue insulin drip Continue fluid boluses Order to runs of potassium to maintain potassium level Metabolic panels every 4 hours CBG every hour Start D5 when CBG less than or equal to 250 Hemoglobin A1c in the morning   DVT prophylaxis: Lovenox  Consultants: None  Code Status: Full  Family Communication: None   Time spent: 50 minutes was spent with face-to-face time with patient with at least 50% with counseling and coordination of care  Loma Boston, DO [facility] Pager [phone]

## 2015-01-07 NOTE — ED Provider Notes (Addendum)
CSN: [id number]     Arrival date & time 01/07/15  1804 History  This chart was scribed for Nat Christen, MD by Randa Evens, ED Scribe. This patient was seen in room APA08/APA08 and the patient's care was started at 6:27 PM.    Chief Complaint  Patient presents with  . Hyperglycemia   The history is provided by the patient. No language interpreter was used.   HPI Comments: Level V caveat for urgent need for intervention. [name] is a 69 y.o. female who presents to the Emergency Department complaining of hyperglycemia onset today PTA. Pt states that for the past 3 days she has also been feeling weak and tired. Pt states she has associated polydipsia . Pt states that she doesn't take any medications for her blood sugar. Pt states that she was recently seen in the ED for similar symptoms. Pt denies any other complaints.    Past Medical History  Diagnosis Date  . H/O: stroke 2012, 2000,     X3  . Hypertension   . Anxiety   . PTSD (post-traumatic stress disorder)   . Depression   . SVD (spontaneous vaginal delivery)     x 1  . Smoker   . Osteoarthritis     knees, hips hands,   . Shortness of breath   . Pain     SEVERE PAIN BACK, RIGHT HIP AND KNEES--HARRINGTON RODS AND CERVICAL PLATES--USES WALKER OR CANE WHEN AMBULATING - GOES TO A PAIN CLINIC-STATES SHE NEEDS RT HIP AND BOTH KNEES REPLACED. PT STATES SHE WAS TOLD THE CEMENT AROUND THE HARRINGTON RODS IS CRACKED.  [name] Hot flashes, menopausal     SEVERE  . Complication of anesthesia 03/02/13    severe headache,vertigo postop  . COPD (chronic obstructive pulmonary disease)     stable  . 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 Lung cancer   . Status post radiation therapy within four to twelve weeks 10/18/13-12/06/13    lung ca  66Gy  . Family history of anesthesia complication     equipment failure caused problem, suffered a stroke & heartattack   Past Surgical History  Procedure Laterality Date  . Carpal tunnel release       BILATERAL  . Nasal sinus surgery    . Lumbar fusion      L4-S1  . Cervical fusion    . Hernia repair    . Salpingoophorectomy Right   . Vaginal hysterectomy      TAH w/ ovary removal  . Transthoracic echocardiogram  12-03-2011    LVSF NORMAL/ EF 60-65%  . Vulvectomy N/A 03/02/2013    Procedure: WIDE LOCAL EXCISION VULVAR;  Surgeon: Imagene Gurney A. Alycia Rossetti, MD;  Location: [facility];  Service: Gynecology;  Laterality: N/A;  . Co2 laser application N/A 6/56/8127    Procedure: LASER APPLICATION OF THE VULVA;  Surgeon: Janie Morning, MD;  Location: [facility];  Service: Gynecology;  Laterality: N/A;  . Video bronchoscopy Bilateral 09/23/2013    Procedure: VIDEO BRONCHOSCOPY WITHOUT FLUORO;  Surgeon: Tanda Rockers, MD;  Location: [facility];  Service: Cardiopulmonary;  Laterality: Bilateral;  . Hemiarthroplasty shoulder fracture Left 05/25/2014    DR CHANDLER  . Reverse shoulder arthroplasty Left 05/24/2014    Procedure:   Hemi-Arthroplasty  Left Shoulder;  Surgeon: Nita Sells, MD;  Location: [place];  Service: Orthopedics;  Laterality: Left;  . Orif humerus fracture Right 12/12/2014    Procedure: OPEN REDUCTION INTERNAL FIXATION (ORIF) PROXIMAL HUMERUS  FRACTURE;  Surgeon: Nita Sells, MD;  Location: [place];  Service: Orthopedics;  Laterality: Right;   Family History  Problem Relation Age of Onset  . Cancer Mother     COLON  . Heart disease Father   . Diabetes Maternal Aunt   . Cancer Maternal Grandfather     KIDNEY   . Diabetes Maternal Grandfather   . Hypertension Maternal Grandfather   . Heart disease Maternal Grandfather    History  Substance Use Topics  . Smoking status: Light Tobacco Smoker -- 0.50 packs/day for 35 years    Types: Cigarettes  . Smokeless tobacco: Never Used     Comment: 4cigs per day 02/17/13  . Alcohol Use: No   OB History    Gravida Para Term Preterm AB TAB SAB Ectopic Multiple Living   2 1   1     1       Review of Systems   Unable to perform ROS: Acuity of condition  Constitutional: Positive for fever.  Endocrine: Positive for polydipsia.  Neurological: Positive for weakness.     Allergies  Carafate; Prednisone; Nsaids; Aspirin; Ibuprofen; and Dulera  Home Medications   Prior to Admission medications   Medication Sig Start Date End Date Taking? Authorizing Provider  Aclidinium Bromide (TUDORZA PRESSAIR) 400 MCG/ACT AEPB Inhale 1 puff into the lungs 2 (two) times daily. 11/10/14  Yes Curt Bears, MD  albuterol (PROVENTIL) (2.5 MG/3ML) 0.083% nebulizer solution Take 2.5 mg by nebulization as needed for wheezing.   Yes Historical Provider, MD  albuterol (VENTOLIN HFA) 108 (90 BASE) MCG/ACT inhaler Inhale 2 puffs into the lungs every 6 (six) hours as needed for wheezing or shortness of breath. 11/10/14  Yes Curt Bears, MD  alprazolam Duanne Moron) 2 MG tablet Take 2 mg by mouth 3 (three) times daily as needed for sleep or anxiety.   Yes Historical Provider, MD  atenolol (TENORMIN) 50 MG tablet Take 1 tablet (50 mg total) by mouth daily. 11/25/13  Yes Velvet Bathe, MD  benzonatate (TESSALON) 200 MG capsule Take 1 capsule (200 mg total) by mouth 3 (three) times daily as needed for cough. 11/15/14  Yes Curt Bears, MD  cephALEXin (KEFLEX) 500 MG capsule Take 1 capsule (500 mg total) by mouth 2 (two) times daily. 01/01/15  Yes Kristen N Ward, DO  chlorzoxazone (PARAFON) 500 MG tablet Take 500 mg by mouth 4 (four) times daily as needed for muscle spasms.   Yes Historical Provider, MD  dextromethorphan-guaiFENesin (MUCINEX DM) 30-600 MG per 12 hr tablet Take 1 tablet by mouth every 12 (twelve) hours.   Yes Historical Provider, MD  esomeprazole (NEXIUM) 40 MG capsule Take 40 mg by mouth daily.    Yes Historical Provider, MD  levothyroxine (SYNTHROID) 50 MCG tablet Take 1 tablet (50 mcg total) by mouth daily before breakfast. 11/23/14  Yes Curt Bears, MD  ondansetron (ZOFRAN ODT) 4 MG disintegrating tablet Take  1 tablet (4 mg total) by mouth every 8 (eight) hours as needed for nausea or vomiting. 04/14/14  Yes Curt Bears, MD  oxyCODONE-acetaminophen (ROXICET) 5-325 MG per tablet Take 1-2 tablets by mouth every 4 (four) hours as needed for severe pain. 12/13/14  Yes Grier Mitts, PA-C  sertraline (ZOLOFT) 100 MG tablet Take 200 mg by mouth daily.    Yes Historical Provider, MD  trazodone (DESYREL) 300 MG tablet Take 300 mg by mouth at bedtime.   Yes Historical Provider, MD  ALPRAZolam Duanne Moron) 1 MG tablet Take 1 tablet (1 mg  total) by mouth every 6 (six) hours as needed for anxiety. Patient not taking: Reported on 01/01/2015 01/01/14   Kinnie Feil, MD  amoxicillin-clavulanate (AUGMENTIN) 875-125 MG per tablet Take 1 tablet by mouth 2 (two) times daily. Patient not taking: Reported on 01/01/2015 12/15/14   Debbe Odea, MD  aspirin EC 325 MG EC tablet Take 1 tablet (325 mg total) by mouth 2 (two) times daily. Patient not taking: Reported on 01/01/2015 12/14/14   Debbe Odea, MD  docusate sodium 100 MG CAPS Take 100 mg by mouth 2 (two) times daily. Patient not taking: Reported on 01/01/2015 12/14/14   Debbe Odea, MD  doxycycline (VIBRA-TABS) 100 MG tablet Take 1 tablet (100 mg total) by mouth every 12 (twelve) hours. Patient not taking: Reported on 01/01/2015 12/15/14   Debbe Odea, MD  tiotropium (SPIRIVA) 18 MCG inhalation capsule Place 1 capsule (18 mcg total) into inhaler and inhale daily. Patient not taking: Reported on 01/01/2015 12/14/14   Debbe Odea, MD   Triage Vitals: BP 120/63 mmHg  Pulse 104  Temp(Src) 98.8 F (37.1 C) (Oral)  Resp 25  Ht 5\' 6"  (1.676 m)  Wt 160 lb (72.576 kg)  BMI 25.84 kg/m2  SpO2 100%  Physical Exam  Constitutional: She is oriented to person, place, and time. She appears well-developed and well-nourished.  Appears slightly dehydrated.   HENT:  Head: Normocephalic and atraumatic.  Eyes: Conjunctivae and EOM are normal. Pupils are equal, round, and reactive  to light.  Neck: Normal range of motion. Neck supple.  Cardiovascular: Normal rate and regular rhythm.   Pulmonary/Chest: Effort normal and breath sounds normal.  Abdominal: Soft. Bowel sounds are normal.  Musculoskeletal: Normal range of motion.  Neurological: She is alert and oriented to person, place, and time.  Skin: Skin is warm and dry.  Psychiatric: She has a normal mood and affect. Her behavior is normal.  Nursing note and vitals reviewed.   ED Course  Procedures (including critical care time) DIAGNOSTIC STUDIES: Oxygen Saturation is 100% on RA, normal by my interpretation.    COORDINATION OF CARE: 6:43 PM-Discussed treatment plan with pt at bedside and pt agreed to plan.     Labs Review Labs Reviewed  BASIC METABOLIC PANEL - Abnormal; Notable for the following:    Sodium 127 (*)    Chloride 93 (*)    CO2 10 (*)    Glucose, Bld 787 (*)    Creatinine, Ser 1.23 (*)    GFR calc non Af Amer 44 (*)    GFR calc Af Amer 51 (*)    Anion gap 24 (*)    All other components within normal limits  CBC WITH DIFFERENTIAL - Abnormal; Notable for the following:    RBC 3.65 (*)    MCV 103.3 (*)    All other components within normal limits  CBG MONITORING, ED - Abnormal; Notable for the following:    Glucose-Capillary >600 (*)    All other components within normal limits  CBG MONITORING, ED - Abnormal; Notable for the following:    Glucose-Capillary >600 (*)    All other components within normal limits  URINALYSIS, ROUTINE W REFLEX MICROSCOPIC    Imaging Review No results found.   EKG Interpretation None     CRITICAL CARE Performed by: Nat Christen  ?  Total critical care time: 30  Critical care time was exclusive of separately billable procedures and treating other patients.  Critical care was necessary to treat or prevent imminent or life-threatening deterioration.  Critical care was time spent personally by me on the following activities: development of  treatment plan with patient and/or surrogate as well as nursing, discussions with consultants, evaluation of patient's response to treatment, examination of patient, obtaining history from patient or surrogate, ordering and performing treatments and interventions, ordering and review of laboratory studies, ordering and review of radiographic studies, pulse oximetry and re-evaluation of patient's condition. MDM   Final diagnoses:  Diabetic ketoacidosis without coma associated with type 1 diabetes mellitus   Patient is a new onset diabetic who has not been taking her medications. She is now in DKA and hemodynamically stable. IV fluids. IV insulin. Glucose stabilizer protocol   I personally performed the services described in this documentation, which was scribed in my presence. The recorded information has been reviewed and is accurate.       Nat Christen, MD 01/07/15 0071  Nat Christen, MD 01/07/15 2157

## 2015-01-07 NOTE — ED Notes (Signed)
x2 unsuccessful IV attempts made by Domenica Reamer RN.

## 2015-01-07 NOTE — ED Notes (Signed)
Patient brought in via EMS from home for hyperglycemia. Airway patent. Alert and oriented. Per EMS blood glucose 588. Per patient does not take insulin or oral medications. Patient reports "slight" nausea. Patient reports frequent urination and thirst. Patient's right arm in sling. Per patient 2 fractures in arm that she is to have surgery on 01/12/2014.

## 2015-01-08 LAB — GLUCOSE, CAPILLARY
GLUCOSE-CAPILLARY: 233 mg/dL — AB (ref 70–99)
GLUCOSE-CAPILLARY: 149 mg/dL — AB (ref 70–99)
GLUCOSE-CAPILLARY: 107 mg/dL — AB (ref 70–99)
GLUCOSE-CAPILLARY: 224 mg/dL — AB (ref 70–99)
Glucose-Capillary: 270 mg/dL — ABNORMAL HIGH (ref 70–99)
Glucose-Capillary: 251 mg/dL — ABNORMAL HIGH (ref 70–99)
Glucose-Capillary: 246 mg/dL — ABNORMAL HIGH (ref 70–99)
Glucose-Capillary: 219 mg/dL — ABNORMAL HIGH (ref 70–99)
Glucose-Capillary: 202 mg/dL — ABNORMAL HIGH (ref 70–99)
Glucose-Capillary: 160 mg/dL — ABNORMAL HIGH (ref 70–99)
Glucose-Capillary: 162 mg/dL — ABNORMAL HIGH (ref 70–99)
Glucose-Capillary: 107 mg/dL — ABNORMAL HIGH (ref 70–99)
Glucose-Capillary: 117 mg/dL — ABNORMAL HIGH (ref 70–99)
Glucose-Capillary: 122 mg/dL — ABNORMAL HIGH (ref 70–99)

## 2015-01-08 LAB — BASIC METABOLIC PANEL
ANION GAP: 8 (ref 5–15)
Anion gap: 5 (ref 5–15)
BUN: 8 mg/dL (ref 6–23)
BUN: 5 mg/dL — AB (ref 6–23)
CO2: 18 mmol/L — AB (ref 19–32)
CO2: 20 mmol/L (ref 19–32)
CREATININE: 0.89 mg/dL (ref 0.50–1.10)
CREATININE: 0.86 mg/dL (ref 0.50–1.10)
Calcium: 7.9 mg/dL — ABNORMAL LOW (ref 8.4–10.5)
Calcium: 8.2 mg/dL — ABNORMAL LOW (ref 8.4–10.5)
Chloride: 105 mEq/L (ref 96–112)
Chloride: 108 mEq/L (ref 96–112)
GFR calc Af Amer: 79 mL/min — ABNORMAL LOW (ref >90)
GFR, EST AFRICAN AMERICAN: 76 mL/min — AB (ref >90)
GFR, EST NON AFRICAN AMERICAN: 66 mL/min — AB (ref >90)
GFR, EST NON AFRICAN AMERICAN: 68 mL/min — AB (ref >90)
GLUCOSE: 304 mg/dL — AB (ref 70–99)
Glucose, Bld: 96 mg/dL (ref 70–99)
POTASSIUM: 3.5 mmol/L (ref 3.5–5.1)
POTASSIUM: 3.3 mmol/L — AB (ref 3.5–5.1)
Sodium: 131 mmol/L — ABNORMAL LOW (ref 135–145)
Sodium: 133 mmol/L — ABNORMAL LOW (ref 135–145)

## 2015-01-08 LAB — CBG MONITORING, ED
GLUCOSE-CAPILLARY: 506 mg/dL — AB (ref 70–99)
Glucose-Capillary: 341 mg/dL — ABNORMAL HIGH (ref 70–99)
Glucose-Capillary: 321 mg/dL — ABNORMAL HIGH (ref 70–99)

## 2015-01-08 LAB — HEMOGLOBIN A1C
HEMOGLOBIN A1C: 10.1 % — AB (ref <5.7)
MEAN PLASMA GLUCOSE: 243 mg/dL — AB (ref <117)

## 2015-01-08 MED ORDER — PANTOPRAZOLE SODIUM 40 MG PO TBEC
40.0000 mg | DELAYED_RELEASE_TABLET | Freq: Every day | ORAL | Status: DC
  Administered 2015-01-08 – 2015-01-11 (×4): 40 mg via ORAL
  Filled 2015-01-08 (×4): qty 1

## 2015-01-08 MED ORDER — TIOTROPIUM BROMIDE MONOHYDRATE 18 MCG IN CAPS
18.0000 ug | ORAL_CAPSULE | Freq: Every day | RESPIRATORY_TRACT | Status: DC
  Administered 2015-01-09 – 2015-01-11 (×2): 18 ug via RESPIRATORY_TRACT
  Filled 2015-01-08 (×2): qty 5

## 2015-01-08 MED ORDER — CHLORZOXAZONE 500 MG PO TABS
500.0000 mg | ORAL_TABLET | Freq: Four times a day (QID) | ORAL | Status: DC | PRN
  Filled 2015-01-08 (×4): qty 1

## 2015-01-08 MED ORDER — SERTRALINE HCL 100 MG PO TABS
200.0000 mg | ORAL_TABLET | Freq: Every day | ORAL | Status: DC
  Administered 2015-01-08 – 2015-01-11 (×4): 200 mg via ORAL
  Filled 2015-01-08 (×4): qty 2

## 2015-01-08 MED ORDER — ALPRAZOLAM 0.5 MG PO TABS
2.0000 mg | ORAL_TABLET | Freq: Three times a day (TID) | ORAL | Status: DC | PRN
  Administered 2015-01-08 – 2015-01-09 (×4): 2 mg via ORAL
  Filled 2015-01-08 (×4): qty 4

## 2015-01-08 MED ORDER — INSULIN ASPART 100 UNIT/ML ~~LOC~~ SOLN
0.0000 [IU] | Freq: Three times a day (TID) | SUBCUTANEOUS | Status: DC
  Administered 2015-01-08: 2 [IU] via SUBCUTANEOUS
  Administered 2015-01-09: 11 [IU] via SUBCUTANEOUS
  Administered 2015-01-09: 3 [IU] via SUBCUTANEOUS

## 2015-01-08 MED ORDER — INSULIN ASPART 100 UNIT/ML ~~LOC~~ SOLN
0.0000 [IU] | Freq: Every day | SUBCUTANEOUS | Status: DC
  Administered 2015-01-08: 2 [IU] via SUBCUTANEOUS

## 2015-01-08 MED ORDER — TRAZODONE HCL 150 MG PO TABS
300.0000 mg | ORAL_TABLET | Freq: Every day | ORAL | Status: DC
Start: 2015-01-08 — End: 2015-01-11
  Administered 2015-01-08 – 2015-01-10 (×3): 300 mg via ORAL
  Filled 2015-01-08 (×5): qty 2

## 2015-01-08 MED ORDER — SODIUM CHLORIDE 0.9 % IV SOLN
INTRAVENOUS | Status: DC
  Administered 2015-01-08: via INTRAVENOUS

## 2015-01-08 MED ORDER — ENOXAPARIN SODIUM 40 MG/0.4ML ~~LOC~~ SOLN
40.0000 mg | Freq: Every day | SUBCUTANEOUS | Status: DC
  Administered 2015-01-08 – 2015-01-11 (×4): 40 mg via SUBCUTANEOUS
  Filled 2015-01-08 (×4): qty 0.4

## 2015-01-08 MED ORDER — OXYCODONE-ACETAMINOPHEN 5-325 MG PO TABS
1.0000 | ORAL_TABLET | ORAL | Status: DC | PRN
  Administered 2015-01-08 (×4): 1 via ORAL
  Administered 2015-01-09 – 2015-01-11 (×7): 2 via ORAL
  Filled 2015-01-08: qty 2
  Filled 2015-01-08 (×2): qty 1
  Filled 2015-01-08 (×2): qty 2
  Filled 2015-01-08: qty 1
  Filled 2015-01-08 (×4): qty 2
  Filled 2015-01-08: qty 1
  Filled 2015-01-08: qty 2

## 2015-01-08 MED ORDER — INSULIN REGULAR HUMAN 100 UNIT/ML IJ SOLN: INTRAMUSCULAR | Status: AC

## 2015-01-08 MED ORDER — ALBUTEROL SULFATE (2.5 MG/3ML) 0.083% IN NEBU: 2.5000 mg | INHALATION_SOLUTION | Freq: Four times a day (QID) | RESPIRATORY_TRACT | Status: DC | PRN

## 2015-01-08 MED ORDER — ATENOLOL 50 MG PO TABS
50.0000 mg | ORAL_TABLET | Freq: Every day | ORAL | Status: DC
  Administered 2015-01-08 – 2015-01-11 (×4): 50 mg via ORAL
  Filled 2015-01-08 (×4): qty 1

## 2015-01-08 MED ORDER — SODIUM CHLORIDE 0.9 % IV SOLN
INTRAVENOUS | Status: DC
  Administered 2015-01-08: 02:00:00 via INTRAVENOUS

## 2015-01-08 MED ORDER — INSULIN GLARGINE 100 UNIT/ML ~~LOC~~ SOLN
12.0000 [IU] | Freq: Once | SUBCUTANEOUS | Status: AC
  Administered 2015-01-08: 12 [IU] via SUBCUTANEOUS
  Filled 2015-01-08: qty 0.12

## 2015-01-08 MED ORDER — POTASSIUM CHLORIDE CRYS ER 20 MEQ PO TBCR
40.0000 meq | EXTENDED_RELEASE_TABLET | Freq: Once | ORAL | Status: AC
  Administered 2015-01-08: 40 meq via ORAL
  Filled 2015-01-08: qty 2

## 2015-01-08 MED ORDER — DEXTROSE-NACL 5-0.45 % IV SOLN
INTRAVENOUS | Status: AC
  Administered 2015-01-08: 05:00:00 via INTRAVENOUS

## 2015-01-08 MED ORDER — SODIUM CHLORIDE 0.9 % IV SOLN
INTRAVENOUS | Status: DC
  Administered 2015-01-08: 17:00:00 via INTRAVENOUS

## 2015-01-08 MED ORDER — CEPHALEXIN 500 MG PO CAPS
500.0000 mg | ORAL_CAPSULE | Freq: Two times a day (BID) | ORAL | Status: AC
  Administered 2015-01-08 – 2015-01-09 (×4): 500 mg via ORAL
  Filled 2015-01-08 (×5): qty 1

## 2015-01-08 MED ORDER — LEVOTHYROXINE SODIUM 50 MCG PO TABS
50.0000 ug | ORAL_TABLET | Freq: Every day | ORAL | Status: DC
  Administered 2015-01-08 – 2015-01-11 (×4): 50 ug via ORAL
  Filled 2015-01-08 (×6): qty 1

## 2015-01-08 NOTE — Progress Notes (Signed)
[facility] TEAM 1 - Stepdown/ICU TEAM Progress Note  [name]:[id number] DOB: 31-Dec-1945 DOA: 01/07/2015 PCP: No PCP Per Patient  Admit HPI / Brief Narrative: 69 y.o. 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 She was diagnosed with a UTI and was noted to have blood sugars in the 300s, although she had a normal anion gap and bicarbonate. The patient felt worse over the past couple of days and came back to the ED for evaluation. The patient's blood sugars was noted to be greater than 700.   HPI/Subjective: The pt states she feels much better.  She denies cp, sob, n/v, or abdom pain.  She has not previously been diagnosed w/ DM, but does state the she has diabetics in her family,    Assessment/Plan:  DKA - newly diagnosed DM Gap closed/bicarb normal - ready for transition off gtt - follow CBG w/ lantus and SSI after  Hyponatremia  Persists therefore likely SIADH or volume depletion - cont NS for now and follow   Dehydration  Cont NS resuscitation   COPD Gold III Some wheezing on exam, but stable - no evidence of severe acute exacerbation   Stage III non-small cell squamous lung carcinoma Undergoing chemo infusion per Dr. Julien Nordmann Q2 weeks via peripheral IV   Chronic pain Well compensated at present  Closed R humerus fx Dec 2015 Remains in sling - pain controlled - s/p ORIF 12/12/14  HTN  Currently well controlled  Recent UTI Cx not successful in identifying bacteria - will complete previously prescribed course of abx  Code Status: FULL Family Communication: no family present at time of exam Disposition Plan: SDU  Consultants: none  Procedures: none  Antibiotics: Keflex 1/10 >  DVT prophylaxis: lovenox  Objective: Blood pressure 128/61, pulse  102, temperature 98.4 F (36.9 C), temperature source Oral, resp. rate 13, height 5\' 6"  (1.676 m), weight 73.6 kg (162 lb 4.1 oz), SpO2 97 %.  Intake/Output Summary (Last 24 hours) at 01/08/15 1613 Last data filed at 01/08/15 1300  Gross per 24 hour  Intake 1424.58 ml  Output    250 ml  Net 1174.58 ml   Exam: General: No acute respiratory distress Lungs: Clear to auscultation bilaterally without wheezes or crackles Cardiovascular: Regular rate and rhythm without murmur gallop or rub normal S1 and S2 Abdomen: Nontender, nondistended, soft, bowel sounds positive, no rebound, no ascites, no appreciable mass Extremities: No significant cyanosis, clubbing, or edema bilateral lower extremities  Data Reviewed: Basic Metabolic Panel:  Recent Labs Lab 01/07/15 2037 01/07/15 2244 01/08/15 0235 01/08/15 1312  NA 127* 131* 131* 133*  K 4.8 3.8 3.5 3.3*  CL 93* 99 105 108  CO2 10* 13* 18* 20  GLUCOSE 787* 581* 304* 96  BUN 10 10 8  5*  CREATININE 1.23* 1.20* 0.89 0.86  CALCIUM 8.4 8.4 7.9* 8.2*    Liver Function Tests: No results for input(s): AST, ALT, ALKPHOS, BILITOT, PROT, ALBUMIN in the last 168 hours. No results for input(s): LIPASE, AMYLASE in the last 168 hours. No results for input(s): AMMONIA in the last 168 hours.  Coags: No results for input(s): INR in the last 168 hours.  Invalid input(s): PT No results for input(s): APTT in the last 168 hours.  CBC:  Recent Labs Lab 01/07/15 1928  WBC 5.4  NEUTROABS 4.1  HGB 12.1  HCT 37.7  MCV 103.3*  PLT 195    Cardiac Enzymes:  Recent Labs Lab 01/01/15 1654  TROPONINI <0.03    CBG:  Recent Labs Lab 01/08/15 0831 01/08/15 0937 01/08/15 1042 01/08/15 1147 01/08/15 1252  GLUCAP 202* 160* 162* 149* 107*    Recent Results (from the past 240 hour(s))  Urine culture     Status: None   Collection Time: 01/01/15  1:50 PM  Result Value Ref Range Status   Specimen Description URINE, CATHETERIZED  Final    Special Requests NONE  Final   Colony Count   Final    75,000 COLONIES/ML Performed at [facility]    Culture   Final    Multiple bacterial morphotypes present, none predominant. Suggest appropriate recollection if clinically indicated. Performed at [facility]    Report Status 01/03/2015 FINAL  Final     Studies:  Recent x-ray studies have been reviewed in detail by the Attending Physician  Scheduled Meds:  Scheduled Meds: . atenolol  50 mg Oral Daily  . cephALEXin  500 mg Oral BID  . enoxaparin (LOVENOX) injection  40 mg Subcutaneous Daily  . insulin aspart  0-15 Units Subcutaneous TID WC  . insulin aspart  0-5 Units Subcutaneous QHS  . levothyroxine  50 mcg Oral QAC breakfast  . pantoprazole  40 mg Oral Daily  . sertraline  200 mg Oral Daily  . tiotropium  18 mcg Inhalation Daily  . trazodone  300 mg Oral QHS    Time spent on care of this patient: 35 mins   MCCLUNG,JEFFREY T , MD   [facility] Office  [phone] Pager - Text Page per Shea Evans as per below:  On-Call/Text Page:      [email or web address]      password [email or web address]  If 7PM-7AM, please contact night-coverage [email or web address] Password [email or web address] 01/08/2015, 4:13 PM   LOS: 1 day

## 2015-01-08 NOTE — Progress Notes (Signed)
Patient transferred from [facility] via [facility], insulin drip infusing. Patient oriented to unit and room, instructed on callbell and placed at side. No family at bedside. Pt home meds sent to pharmacy. Will continue to monitor.

## 2015-01-09 LAB — BASIC METABOLIC PANEL
Anion gap: 9 (ref 5–15)
Anion gap: 9 (ref 5–15)
BUN: <5 mg/dL — ABNORMAL LOW (ref 6–23)
CHLORIDE: 109 meq/L (ref 96–112)
CO2: 17 mmol/L — ABNORMAL LOW (ref 19–32)
CO2: 26 mmol/L (ref 19–32)
CREATININE: 0.36 mg/dL — AB (ref 0.50–1.10)
CREATININE: 0.81 mg/dL (ref 0.50–1.10)
Calcium: 7.9 mg/dL — ABNORMAL LOW (ref 8.4–10.5)
Calcium: 8.2 mg/dL — ABNORMAL LOW (ref 8.4–10.5)
Chloride: 103 mEq/L (ref 96–112)
GFR calc Af Amer: >90 mL/min (ref >90)
GFR, EST AFRICAN AMERICAN: 85 mL/min — AB (ref >90)
GFR, EST NON AFRICAN AMERICAN: 73 mL/min — AB (ref >90)
Glucose, Bld: 196 mg/dL — ABNORMAL HIGH (ref 70–99)
Glucose, Bld: 370 mg/dL — ABNORMAL HIGH (ref 70–99)
POTASSIUM: 3.9 mmol/L (ref 3.5–5.1)
Potassium: 4.3 mmol/L (ref 3.5–5.1)
SODIUM: 138 mmol/L (ref 135–145)
Sodium: 135 mmol/L (ref 135–145)

## 2015-01-09 LAB — GLUCOSE, CAPILLARY
GLUCOSE-CAPILLARY: 224 mg/dL — AB (ref 70–99)
Glucose-Capillary: 303 mg/dL — ABNORMAL HIGH (ref 70–99)
Glucose-Capillary: 200 mg/dL — ABNORMAL HIGH (ref 70–99)
Glucose-Capillary: 163 mg/dL — ABNORMAL HIGH (ref 70–99)

## 2015-01-09 LAB — CBC
HCT: 33.8 % — ABNORMAL LOW (ref 36.0–46.0)
Hemoglobin: 11.2 g/dL — ABNORMAL LOW (ref 12.0–15.0)
MCH: 32 pg (ref 26.0–34.0)
MCHC: 33.1 g/dL (ref 30.0–36.0)
MCV: 96.6 fL (ref 78.0–100.0)
PLATELETS: 137 10*3/uL — AB (ref 150–400)
RBC: 3.5 MIL/uL — ABNORMAL LOW (ref 3.87–5.11)
RDW: 15.1 % (ref 11.5–15.5)
WBC: 4.8 10*3/uL (ref 4.0–10.5)

## 2015-01-09 MED ORDER — LIVING WELL WITH DIABETES BOOK
Freq: Once | Status: AC
  Administered 2015-01-09: 1
  Filled 2015-01-09: qty 1

## 2015-01-09 MED ORDER — INSULIN ASPART 100 UNIT/ML ~~LOC~~ SOLN
3.0000 [IU] | Freq: Three times a day (TID) | SUBCUTANEOUS | Status: DC
  Administered 2015-01-10 – 2015-01-11 (×4): 3 [IU] via SUBCUTANEOUS

## 2015-01-09 MED ORDER — INSULIN ASPART 100 UNIT/ML ~~LOC~~ SOLN: 0.0000 [IU] | Freq: Three times a day (TID) | SUBCUTANEOUS | Status: DC

## 2015-01-09 MED ORDER — LISINOPRIL 5 MG PO TABS
5.0000 mg | ORAL_TABLET | Freq: Every day | ORAL | Status: DC
  Administered 2015-01-09 – 2015-01-11 (×3): 5 mg via ORAL
  Filled 2015-01-09 (×3): qty 1

## 2015-01-09 MED ORDER — INSULIN ASPART 100 UNIT/ML ~~LOC~~ SOLN
0.0000 [IU] | Freq: Three times a day (TID) | SUBCUTANEOUS | Status: DC
  Administered 2015-01-09: 7 [IU] via SUBCUTANEOUS
  Administered 2015-01-10 (×2): 4 [IU] via SUBCUTANEOUS
  Administered 2015-01-10: 3 [IU] via SUBCUTANEOUS
  Administered 2015-01-11: 4 [IU] via SUBCUTANEOUS

## 2015-01-09 MED ORDER — ALBUTEROL SULFATE (2.5 MG/3ML) 0.083% IN NEBU: 2.5000 mg | INHALATION_SOLUTION | RESPIRATORY_TRACT | Status: DC | PRN

## 2015-01-09 MED ORDER — INSULIN ASPART 100 UNIT/ML ~~LOC~~ SOLN: 0.0000 [IU] | Freq: Every day | SUBCUTANEOUS | Status: DC

## 2015-01-09 MED ORDER — INSULIN GLARGINE 100 UNIT/ML ~~LOC~~ SOLN
20.0000 [IU] | Freq: Every day | SUBCUTANEOUS | Status: DC
  Administered 2015-01-09 – 2015-01-10 (×2): 20 [IU] via SUBCUTANEOUS
  Filled 2015-01-09 (×3): qty 0.2

## 2015-01-09 NOTE — Progress Notes (Signed)
Pt transfered to the unit at 1455. Pt mental status is alert & oriented. Pt oriented to room, staff, and call bell. Skin is intact.Call bell within reach. Visitor guidelines reviewed w/ pt and/or family.

## 2015-01-09 NOTE — Progress Notes (Addendum)
Inpatient Diabetes Program Recommendations  AACE/ADA: New Consensus Statement on Inpatient Glycemic Control (2013)  Target Ranges:  Prepandial:   less than 140 mg/dL      Peak postprandial:   less than 180 mg/dL (1-2 hours)      Critically ill patients:  140 - 180 mg/dL     Results for [name], [name] (MRN [id number]) as of 01/09/2015 07:52  Ref. Range 01/07/2015 20:37  Sodium Latest Range: 136-145 mEq/L 127 (L)  Potassium Latest Range: 3.5-5.1 mEq/L 4.8  Chloride Latest Range: 96-112 mEq/L 93 (L)  CO2 Latest Range: 19-32 mmol/L 10 (LL)  BUN Latest Range: 7.0-26.0 mg/dL 10  Creatinine Latest Range: 0.50-1.10 mg/dL 1.23 (H)  Calcium Latest Range: 8.4-10.5 mg/dL 8.4  GFR calc non Af Amer Latest Range: >90 mL/min 44 (L)  GFR calc Af Amer Latest Range: >90 mL/min 51 (L)  Glucose Latest Range: 70-99 mg/dL 787 (HH)  Anion gap Latest Range: 5-15  24 (H)    Admitted with UTI/ DKA/ New diagnosis of DM.  History of CVA, COPD, Lung cancer, HTN.   **Patient hydrated and started on IV insulin drip.    **Transitioned off IV insulin drip last PM.  Was given 12 units Lantus insulin yesterday at 4pm.  **Current Orders: Novolog Moderate SSI tid ac + HS  **DM Coordinator to see patient today to discuss new diagnosis of DM.    MD- Do you plan to send patient home on insulin?  Not sure how receptive patient will be to insulin at home. Patient not willing to stop drinking drinks with sugar (ie sodas, sweet eta, etc).   Addendum 1130am: Spoke with pt about new diagnosis.  Discussed A1C results with her and explained what an A1C is, basic pathophysiology of DM Type 2, basic home care, basic diabetes diet nutrition principles, importance of checking CBGs and maintaining good CBG control to prevent long-term and short-term complications.  Also reviewed blood sugar goals at home.    RNs to provide ongoing basic DM education at bedside with this patient.  Have ordered educational booklet and DM videos.   Have also placed RD consult for DM diet education for this patient.  Patient not very receptive to my visit.  Patient would not make much eye contact with me and stated to me that she would not give up regular coke or sweet tea.  Patient asked me to go get her a regular coke and then became frustrated with me when I told her I could not bring her one.  Attempted to discuss how carbohydrates affect blood sugar levels and also attempted to explain to patient that regular drinks can adversely raise blood sugar levels.  Attempted to contract with patient to reduce home consumption of drinks with sugar, however, patient not very receptive.    Patient currently does not have her glasses and cannot read the small print in her DM educational booklet.  Have asked RNs to please continue review of booklet with patient and to also allow her to practice fingerstick glucose checks.  May need insulin education pending MD decision.  Will order DM videos so patient can watch and listen to DM information since she doesn't have her glasses.     Will follow Wyn Quaker RN, MSN, CDE Diabetes Coordinator Inpatient Diabetes Program Team Pager: [phone] (8a-10p)

## 2015-01-09 NOTE — Clinical Social Work Psychosocial (Addendum)
Clinical Social Work Department BRIEF PSYCHOSOCIAL ASSESSMENT 01/09/2015  Patient:  [name], [name]     Account Number:  [id number]     Admit date:  01/07/2015  Clinical Social Worker:  Marciano Sequin  Date/Time:  01/09/2015 01:15 PM  Referred by:  RN  Date Referred:  01/09/2015 Referred for  Self Neglect   Other Referral:   Interview type:  Patient Other interview type:    PSYCHOSOCIAL DATA Living Status:  ALONE Admitted from facility:   Level of care:   Primary support name:  Bray,Greg Primary support relationship to patient:   Degree of support available:   Fair Support    CURRENT CONCERNS Current Concerns  Post-Acute Placement   Other Concerns:    SOCIAL WORK ASSESSMENT / PLAN CSW met pt at bedside. Per pt requested CSW called the pt's son [name]. CSW introduced self and purpose of visit/call. CSW, pt and Veleta Miners discussed the clinical team concerns. The pt reported that she will return home at discharge. [name] reported he has attempted to get the pt into [facility], but the pt refused. [name] reported the pt has been in a SNF in the past, but she check herself out AMA. [name] reported that he works a lot and he cannot check on the pt regularly. [name] reported the pt's friend and neighbor Fraser Din checks on her often. [name] reported Fraser Din keep the pt's narcotic prescription at her house on a lock box and Fraser Din administrate the pills as directed. The pt reported that that she is not a child and she can make her own decisions. [name] reported that the pt met with a lawyer Jori Moll [phone]) to draft POA paperwork. [name] reported not knowing if the pt has change the POA paperwork appointing someone else as her POA. [name] reported calling the lawyer several times to receive a copy of the POA paperwork, but Ms. Stoupe has not returned his call. CSW left a voice message for Ms. Stoupe. CSW provided the pt and [name] with contact information for further questions. CSW will continue to follow  this pt and assist with discharge as needed.   Assessment/plan status:  Psychosocial Support/Ongoing Assessment of Needs Other assessment/ plan:   Information/referral to community resources:    PATIENT'S/FAMILY'S RESPONSE TO PLAN OF CARE: [name] and the pt dicussed alternated placement vs going home. The pt was not receptive to ALF or SNF placement. [name] expressed concerns with the pt returning home. [name] reported the pt has 20 years of healthcare experinces and feels like she is losing control over her life. The pt expressed being an capable adults who wants to live her life as she pleases.   [place], MSW, [place]

## 2015-01-09 NOTE — Progress Notes (Signed)
[facility] TEAM 1 - Stepdown/ICU TEAM Progress Note  TYIANNA MENEFEE QIO:[id number] DOB: August 07, 1946 DOA: 01/07/2015 PCP: No PCP Per Patient  Admit HPI / Brief Narrative: 69 y.o. 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 She was diagnosed with a UTI and was noted to have blood sugars in the 300s, although she had a normal anion gap and bicarbonate. The patient felt worse and came back to the ED for evaluation. The patient's blood sugars was noted to be greater than 700.   HPI/Subjective: The pt c/o feeling tired and "not being left alone by the nurses."  She also c/o modest pain in her R arm, but o/w denies new complaints.  We had a lengthy talk about DM, and my intention to d/c her home on insulin to begin with.  She told me she will have no problem giving herself the insulin if we teach her.  Despite this discussion, including diet restrictions, as I was leaving the room she asked me to get her a "regular coke" and became visibly frustrated when I reminded her that this was not acceptable on her DM diet.    Assessment/Plan:  DKA - newly diagnosed DM Transitioned off gtt - A1c 10.1 - my intention is for her to be d/c on insulin - perhaps with time she could be transitioned to oral tx in the outpt setting, but i doubt this will be successful - continue DM teaching, including use of CBG meter and insulin dosing and diet - given some existing concern for poor self care at home, she may require a more supportive environment at d/c - begin ASA - begin ACE - check lipids - strive for better CBG control prior to d/c home (bicarb currently falling again)  Hyponatremia  Resolved w/ NS resuscitation - stop IVF and follow Na trend   Dehydration  Resolve w/ NS resuscitation   COPD Gold  III Stable/no wheezing today - no evidence of severe acute exacerbation   Stage III non-small cell squamous lung carcinoma Undergoing chemo infusion per Dr. Julien Nordmann Q2 weeks via peripheral IV   Chronic pain Well compensated at present  Closed R humerus fx Dec 2015 Remains in sling - pain reasonably controlled - s/p ORIF 12/12/14  HTN  Currently well controlled  Recent UTI Cx not successful in identifying bacteria - will complete previously prescribed course of abx  Code Status: FULL Family Communication: no family present at time of exam Disposition Plan: transfer to med bed - PT/OT evals - ongoing DM tx - need to see CBG more consistently controlled prior to d/c   Consultants: none  Procedures: none  Antibiotics: Keflex 1/10 >  DVT prophylaxis: lovenox  Objective: Blood pressure 151/67, pulse 78, temperature 97.8 F (36.6 C), temperature source Oral, resp. rate 11, height 5\' 6"  (1.676 m), weight 73.6 kg (162 lb 4.1 oz), SpO2 95 %.  Intake/Output Summary (Last 24 hours) at 01/09/15 1151 Last data filed at 01/09/15 0800  Gross per 24 hour  Intake   2110 ml  Output    150 ml  Net   1960 ml   Exam: General: No acute respiratory distress - flat affect  Lungs: Clear to auscultation bilaterally without wheezes or crackles Cardiovascular: Regular rate and rhythm without murmur gallop or rub Abdomen: Nontender, nondistended, soft, bowel sounds positive, no rebound,  no ascites, no appreciable mass Extremities: No significant cyanosis, clubbing, or edema bilateral lower extremities  Data Reviewed: Basic Metabolic Panel:  Recent Labs Lab 01/07/15 2037 01/07/15 2244 01/08/15 0235 01/08/15 1312 01/09/15 0003  NA 127* 131* 131* 133* 135  K 4.8 3.8 3.5 3.3* 3.9  CL 93* 99 105 108 109  CO2 10* 13* 18* 20 17*  GLUCOSE 787* 581* 304* 96 196*  BUN 10 10 8  5* <5*  CREATININE 1.23* 1.20* 0.89 0.86 0.81  CALCIUM 8.4 8.4 7.9* 8.2* 8.2*    Liver Function Tests: No  results for input(s): AST, ALT, ALKPHOS, BILITOT, PROT, ALBUMIN in the last 168 hours. No results for input(s): LIPASE, AMYLASE in the last 168 hours. No results for input(s): AMMONIA in the last 168 hours.   CBC:  Recent Labs Lab 01/07/15 1928 01/09/15 0003  WBC 5.4 4.8  NEUTROABS 4.1  --   HGB 12.1 11.2*  HCT 37.7 33.8*  MCV 103.3* 96.6  PLT 195 137*   CBG:  Recent Labs Lab 01/08/15 1357 01/08/15 1500 01/08/15 1636 01/08/15 2116 01/09/15 0819  GLUCAP 107* 117* 122* 224* 303*    Recent Results (from the past 240 hour(s))  Urine culture     Status: None   Collection Time: 01/01/15  1:50 PM  Result Value Ref Range Status   Specimen Description URINE, CATHETERIZED  Final   Special Requests NONE  Final   Colony Count   Final    75,000 COLONIES/ML Performed at [facility]    Culture   Final    Multiple bacterial morphotypes present, none predominant. Suggest appropriate recollection if clinically indicated. Performed at [facility]    Report Status 01/03/2015 FINAL  Final     Studies:  Recent x-ray studies have been reviewed in detail by the Attending Physician  Scheduled Meds:  Scheduled Meds: . atenolol  50 mg Oral Daily  . cephALEXin  500 mg Oral BID  . enoxaparin (LOVENOX) injection  40 mg Subcutaneous Daily  . insulin aspart  0-15 Units Subcutaneous TID WC  . insulin aspart  0-5 Units Subcutaneous QHS  . levothyroxine  50 mcg Oral QAC breakfast  . pantoprazole  40 mg Oral Daily  . sertraline  200 mg Oral Daily  . tiotropium  18 mcg Inhalation Daily  . trazodone  300 mg Oral QHS    Time spent on care of this patient: 35 mins   Mardie Kellen T , MD   [facility] Office  [phone] Pager - Text Page per Shea Evans as per below:  On-Call/Text Page:      [email or web address]      password [email or web address]  If 7PM-7AM, please contact night-coverage [email or web address] Password [email or web address] 01/09/2015, 11:51 AM   LOS: 2 days

## 2015-01-09 NOTE — Progress Notes (Signed)
Nutrition Consult  Received consult for diabetes diet education for new onset diabetes. Visited patient for diet education. Patient not receptive for education at this time. Patient stated, "Not right now, I'm busy." RD provided diabetes diet education handouts for patient to review at a later time. No further nutrition needs identified at this time. Please re-consult if nutrition concerns arise.  Molli Barrows, RD, LDN, [place] Pager [phone] After Hours Pager [phone]

## 2015-01-09 NOTE — Progress Notes (Signed)
Patient to transfer to 5W23 report given to receiving nurse Denyse Amass all questions answered at this time.  Patient stable at transfer.

## 2015-01-10 DIAGNOSIS — F329 Major depressive disorder, single episode, unspecified: Secondary | ICD-10-CM

## 2015-01-10 LAB — BASIC METABOLIC PANEL
Anion gap: 3 — ABNORMAL LOW (ref 5–15)
CALCIUM: 7.6 mg/dL — AB (ref 8.4–10.5)
CO2: 24 mmol/L (ref 19–32)
Chloride: 104 mEq/L (ref 96–112)
Creatinine, Ser: 0.59 mg/dL (ref 0.50–1.10)
GFR calc non Af Amer: >90 mL/min (ref >90)
Glucose, Bld: 191 mg/dL — ABNORMAL HIGH (ref 70–99)
Potassium: 2.8 mmol/L — ABNORMAL LOW (ref 3.5–5.1)
Sodium: 131 mmol/L — ABNORMAL LOW (ref 135–145)

## 2015-01-10 LAB — GLUCOSE, CAPILLARY
Glucose-Capillary: 200 mg/dL — ABNORMAL HIGH (ref 70–99)
Glucose-Capillary: 126 mg/dL — ABNORMAL HIGH (ref 70–99)
Glucose-Capillary: 169 mg/dL — ABNORMAL HIGH (ref 70–99)
Glucose-Capillary: 96 mg/dL (ref 70–99)

## 2015-01-10 MED ORDER — ALPRAZOLAM 0.5 MG PO TABS
2.0000 mg | ORAL_TABLET | Freq: Three times a day (TID) | ORAL | Status: DC
  Administered 2015-01-10 – 2015-01-11 (×5): 2 mg via ORAL
  Filled 2015-01-10 (×2): qty 4
  Filled 2015-01-10: qty 8
  Filled 2015-01-10 (×2): qty 4

## 2015-01-10 MED ORDER — INSULIN STARTER KIT- PEN NEEDLES (ENGLISH)
1.0000 | Freq: Once | Status: AC
  Administered 2015-01-10: 1
  Filled 2015-01-10: qty 1

## 2015-01-10 MED ORDER — POTASSIUM CHLORIDE CRYS ER 20 MEQ PO TBCR
40.0000 meq | EXTENDED_RELEASE_TABLET | ORAL | Status: AC
  Administered 2015-01-10 (×2): 40 meq via ORAL
  Filled 2015-01-10 (×2): qty 2

## 2015-01-10 NOTE — Progress Notes (Signed)
PT Cancellation Note  Patient Details Name: [name] MRN: [id number] DOB: [birth year]   Cancelled Treatment:    Reason Eval/Treat Not Completed: Patient at procedure or test/unavailable Patient currently working with occupational therapy. Will follow up at a later time when pt is available.  [place], [place] Candie Mile S 01/10/2015, 10:09 AM

## 2015-01-10 NOTE — Evaluation (Signed)
Occupational Therapy Evaluation Patient Details Name: [name] MRN: [id number] DOB: November 23, 1946 Today's Date: 01/10/2015    History of Present Illness Pt is a 69 yo female admitted with DKA.  Pt states she did not know she was diabetic but is.  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  Pt has also had cervical fusion, lumbar fusion, L shoulder surgery.  Pt also had ORIF to R humerous fx in 12/15.  She currently still wears sling.  Old notes state she was WBAT after that surgery.   Clinical Impression   Pt admitted for the above diagnosis and has the deficits listed below. Pt would benefit from cont OT to increase I with basic adls, increase safety with mobility during adls, increase awareness of her medical situation and increase general independence so she can d/c back home to I living.  Given this pt's PMH and her current medical situation and new DM diagnosis as well as her cognition, this therapist does not feel this pt should be living alone.  Pt will need strict 24/7 S at dc or should dc to SNF.    Follow Up Recommendations  SNF;Supervision/Assistance - 24 hour    Equipment Recommendations  None recommended by OT    Recommendations for Other Services       Precautions / Restrictions Precautions Precautions: Fall;Shoulder Type of Shoulder Precautions: pt with surgery 1 mo ago.  Pt is unsure of current precautions. Shoulder Interventions: Shoulder sling/immobilizer Precaution Comments: Pt states she fell in Dec and in January. Required Braces or Orthoses: Sling (unsure if it is "required") Restrictions RUE Weight Bearing: Weight bearing as tolerated LUE Weight Bearing: Weight bearing as tolerated      Mobility Bed Mobility Overal bed mobility: Needs Assistance Bed Mobility: Supine to Sit     Supine to sit: Min assist;HOB elevated     General bed mobility comments: cues to come all the way  to side of bed before standing.  Transfers Overall transfer level: Needs assistance Equipment used: Rolling walker (2 wheeled) Transfers: Sit to/from [facility] Sit to Stand: Min guard Stand pivot transfers: Min assist       General transfer comment: Pt required repeated cues for safety with hand placement and walker use.    Balance Overall balance assessment: Needs assistance Sitting-balance support: Feet supported Sitting balance-Leahy Scale: Good     Standing balance support: Bilateral upper extremity supported;During functional activity Standing balance-Leahy Scale: Fair Standing balance comment: Pt could stand alone w/o walker for short moments but is not safe doing so standing more than a few seconds.                            ADL Overall ADL's : Needs assistance/impaired Eating/Feeding: Set up;Sitting   Grooming: Wash/dry hands;Wash/dry face;Oral care;Min guard;Standing Grooming Details (indicate cue type and reason): Pt requries S to stand Upper Body Bathing: Sitting;Minimal assitance Upper Body Bathing Details (indicate cue type and reason): Pt would get started with the task but would not finish without cues to continue. Lower Body Bathing: Moderate assistance;Sit to/from stand Lower Body Bathing Details (indicate cue type and reason): again, pt with initiation problems.  Pt required min guard to stand and wash back side. Upper Body Dressing : Minimal assistance;Sitting Upper Body Dressing Details (indicate cue type and reason): cues for shoulder technique.  pt required cues to dress R  arm first due to surgery. Lower Body Dressing: Moderate assistance;Sit to/from stand Lower Body Dressing Details (indicate cue type and reason): Pt requires min guard to stand and assist with socks and shoes due to back pain when crossing legs.  Pt became frustated during session and became less cooperative as session went on.  She just wanted to go home but   had no understanding of why she could not. Toilet Transfer: Minimal assistance;Comfort height toilet;Grab bars Toilet Transfer Details (indicate cue type and reason): cues for hand placement and safety. Toileting- Clothing Manipulation and Hygiene: Minimal assistance;Sit to/from stand Toileting - Clothing Manipulation Details (indicate cue type and reason): cues for safety.     Functional mobility during ADLs: Minimal assistance;Rolling walker General ADL Comments: Overall pt was very unsafe and lacked insight to her deficits and why she cannot go home.  Pt with new diabetes, refusing to eat, not interested in checking blood sugars and cannot recall any precautions about her R shoulder.  FEel she will be a fall risk and health risk if sent  home alone.     Vision                     Perception Perception Perception Tested?: No   Praxis      Pertinent Vitals/Pain Pain Assessment: 0-10 Pain Score: 6  Pain Location: all over..back, R shoulder, hips. Pain Descriptors / Indicators: Aching Pain Intervention(s): Limited activity within patient's tolerance;Repositioned     Hand Dominance Right   Extremity/Trunk Assessment Upper Extremity Assessment Upper Extremity Assessment: RUE deficits/detail;LUE deficits/detail RUE Deficits / Details: WFL except for Shoulder.  Shoulder AROM to 30 degrees flextion and 30 degrees abduction.   RUE: Unable to fully assess due to pain RUE Coordination: decreased gross motor LUE Deficits / Details: Pt with AROM to 90 degrees of flextion and abduction.  LUE: Unable to fully assess due to pain LUE Coordination: decreased gross motor   Lower Extremity Assessment Lower Extremity Assessment: Defer to PT evaluation   Cervical / Trunk Assessment Cervical / Trunk Assessment: Other exceptions Cervical / Trunk Exceptions: pt with surgery to back and neck with ongoing pain.   Communication Communication Communication: No difficulties   Cognition  Arousal/Alertness: Awake/alert Behavior During Therapy: Agitated (pt argumentative.) Overall Cognitive Status: Impaired/Different from baseline Area of Impairment: Attention;Memory;Awareness;Safety/judgement;Problem solving   Current Attention Level: Sustained Memory: Decreased recall of precautions;Decreased short-term memory   Safety/Judgement: Decreased awareness of safety;Decreased awareness of deficits Awareness: Emergent Problem Solving: Decreased initiation General Comments: Pt had great difficulty recalling her home situation and who helps her with what.  Chart states a neighbor helps with meds.  pt states she does her own meds.  Many conflicting stories with this pts care.   General Comments       Exercises       Shoulder Instructions      Home Living Family/patient expects to be discharged to:: Private residence Living Arrangements: Alone Available Help at Discharge: Other (Comment) (I living but no meals) Type of Home: Apartment Home Access: Elevator     Home Layout: One level     Bathroom Shower/Tub: Occupational psychologist: Standard Bathroom Accessibility: Yes How Accessible: Accessible via walker Home Equipment: [place] - 2 wheels;Cane - quad;Shower seat   Additional Comments: Pt states her best friend Sherrie can stay with her if needed.      Prior Functioning/Environment Level of Independence: Needs assistance  Gait / Transfers Assistance Needed: Pt uses RW  at all times.   ADL's / Homemaking Assistance Needed: neighbor helps with meals and housekeeping and appointment. Per neighbor, son is not reliable.   Comments: Pt is responsible for her self care, cooking etc when she does not have help    OT Diagnosis: Cognitive deficits;Generalized weakness;Acute pain   OT Problem List: Decreased strength;Decreased range of motion;Decreased activity tolerance;Impaired balance (sitting and/or standing);Decreased cognition;Decreased safety  awareness;Decreased knowledge of use of DME or AE;Decreased knowledge of precautions;Impaired UE functional use;Pain   OT Treatment/Interventions: Self-care/ADL training;Therapeutic exercise;Therapeutic activities;DME and/or AE instruction;Cognitive remediation/compensation    OT Goals(Current goals can be found in the care plan section) Acute Rehab OT Goals Patient Stated Goal: to go home. OT Goal Formulation: With patient Time For Goal Achievement: 01/24/15 Potential to Achieve Goals: Fair ADL Goals Pt Will Perform Eating: with set-up;sitting Pt Will Perform Grooming: with supervision;standing Pt Will Perform Upper Body Bathing: with set-up;sitting Pt Will Perform Upper Body Dressing: with set-up;sitting Pt Will Perform Tub/Shower Transfer: Shower transfer;shower seat;ambulating;rolling walker;with supervision Additional ADL Goal #1: Pt will state 3 reasons why it is so important to monitor her diabetes at home to increase awareness of her deficits.  OT Frequency: Min 2X/week   Barriers to D/C: Decreased caregiver support  pt lives alone.  does not have consistent 24 hour assist.       Co-evaluation              End of Session Equipment Utilized During Treatment: Rolling walker Nurse Communication: Mobility status  Activity Tolerance: Patient limited by pain Patient left: in chair;with call bell/phone within reach   Time: 9379-0240 OT Time Calculation (min): 45 min Charges:  OT General Charges $OT Visit: 1 Procedure OT Evaluation $Initial OT Evaluation Tier I: 1 Procedure OT Treatments $Self Care/Home Management : 38-52 mins G-Codes:    Glenford Peers 02/05/2015, 11:02 AM  [phone]

## 2015-01-10 NOTE — Progress Notes (Signed)
   PATIENT ID: [name]        Subjective:Patient admitted with DKA.   R shoulder not really bothering her at this point.  She was found to have displacement of ORIF R shoulder on XR, which will likely require revision surgery.  Objective:  Filed Vitals:   01/10/15 1704  BP: 132/64  Pulse: 71  Temp: 98.8 F (37.1 C)  Resp: 15     R shoulder incision well healed.  No warmth, erythema or swelling.  No pain with gentle ROM, distally NVI.  Labs:   Recent Labs  01/07/15 1928 01/09/15 0003  HGB 12.1 11.2*   Recent Labs  01/07/15 1928 01/09/15 0003  WBC 5.4 4.8  RBC 3.65* 3.50*  HCT 37.7 33.8*  PLT 195 137*   Recent Labs  01/09/15 1800 01/10/15 0719  NA 138 131*  K 4.3 2.8*  CL 103 104  CO2 26 24  BUN <5* <5*  CREATININE 0.36* 0.59  GLUCOSE 370* 191*  CALCIUM 7.9* 7.6*    Assessment and Plan: S/p R shoulder ORIF with displacement, but minimally symptomatic We had initially planned on revision surgery Thursday, however this is not urgent In light of recent events, we both feel this is best delayed. She can come see me in my office in about 2 wks to reassess.

## 2015-01-10 NOTE — Evaluation (Signed)
Physical Therapy Evaluation Patient Details Name: [name] MRN: [id number] DOB: Nov 14, 1946 Today's Date: 01/10/2015   History of Present Illness  Pt is a 69 yo female admitted with DKA.  Pt states she did not know she was diabetic but is.  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  Pt has also had cervical fusion, lumbar fusion, L shoulder surgery.  Pt also had ORIF to R humerous fx in 12/15.  She currently still wears sling.  Old notes state she was WBAT after that surgery.  Clinical Impression  Pt admitted with above diagnosis. Pt currently with functional limitations due to the deficits listed below (see PT Problem List). Min assist for transfers, close guard for ambulation, limited by shoulder pain. Disoriented to month even after being reminded earlier in session. Feel she is unable to safely care for herself at this time and she agrees. Pt will benefit from skilled PT to increase their independence and safety with mobility to allow discharge to the venue listed below.       Follow Up Recommendations SNF;Other (comment) (May progress to ALF.)    Equipment Recommendations  None recommended by PT    Recommendations for Other Services       Precautions / Restrictions Precautions Precautions: Fall;Shoulder Type of Shoulder Precautions: pt with surgery 1 mo ago.  Pt is unsure of current precautions. Shoulder Interventions: Shoulder sling/immobilizer Precaution Comments: Pt states she fell in Dec and in January. Required Braces or Orthoses: Sling Restrictions RUE Weight Bearing: Weight bearing as tolerated LUE Weight Bearing: Weight bearing as tolerated      Mobility  Bed Mobility Overal bed mobility: Needs Assistance Bed Mobility: Sit to Supine     Supine to sit: Min guard     General bed mobility comments: Min guard for safety. VC for technique. Requires extra time. Difficulty scooting up in  bed.  Transfers Overall transfer level: Needs assistance Equipment used: Rolling walker (2 wheeled) Transfers: Sit to/from Stand Sit to Stand: Min assist Stand pivot transfers: Min assist       General transfer comment: Min assist for boost to stand from reclining chair. Good control with descent onto bed. VC for hand placement and to shift weight anteriorly for mechanical advantage.  Ambulation/Gait Ambulation/Gait assistance: Min guard Ambulation Distance (Feet): 50 Feet Assistive device: Rolling walker (2 wheeled) Gait Pattern/deviations: Step-through pattern;Decreased stride length;Trunk flexed Gait velocity: decreased   General Gait Details: Educated on safe use of DME with frequent cues for upright posture. Pt limited by shoulder pain and did not wish to ambulate further. Min guard for safety, slow and guarded.  Stairs            Wheelchair Mobility    Modified Rankin (Stroke Patients Only)       Balance Overall balance assessment: Needs assistance Sitting-balance support: No upper extremity supported;Feet supported Sitting balance-Leahy Scale: Good     Standing balance support: No upper extremity supported;During functional activity Standing balance-Leahy Scale: Fair Standing balance comment: Pt could stand alone w/o walker for short moments but is not safe doing so standing more than a few seconds.                             Pertinent Vitals/Pain Pain Assessment: 0-10 Pain Score: 8  Pain Location: Right Shoulder Pain Descriptors / Indicators: Aching Pain Intervention(s): Limited activity within patient's  tolerance;Monitored during session;Repositioned    Home Living Family/patient expects to be discharged to:: Private residence Living Arrangements: Alone Available Help at Discharge: Other (Comment) (I living but no meals) Type of Home: Apartment Home Access: Elevator     Home Layout: One level Home Equipment: Walker - 2 wheels;Cane -  quad;Shower seat Additional Comments: Pt states her best friend Sherrie can stay with her if needed.    Prior Function Level of Independence: Needs assistance   Gait / Transfers Assistance Needed: Pt uses RW at all times.    ADL's / Homemaking Assistance Needed: neighbor helps with meals and housekeeping and appointment. Per neighbor, son is not reliable.  Comments: Pt is responsible for her self care, cooking etc when she does not have help     Hand Dominance   Dominant Hand: Right    Extremity/Trunk Assessment   Upper Extremity Assessment: Defer to OT evaluation RUE Deficits / Details: WFL except for Shoulder.  Shoulder AROM to 30 degrees flextion and 30 degrees abduction.   RUE: Unable to fully assess due to pain   LUE Deficits / Details: Pt with AROM to 90 degrees of flextion and abduction.    Lower Extremity Assessment: Generalized weakness      Cervical / Trunk Assessment: Other exceptions  Communication   Communication: No difficulties  Cognition Arousal/Alertness: Awake/alert Behavior During Therapy: WFL for tasks assessed/performed Overall Cognitive Status: Impaired/Different from baseline Area of Impairment: Orientation;Safety/judgement Orientation Level: Disoriented to;Time (Incorrect month x2 even after re-oriented) Current Attention Level: Sustained Memory: Decreased recall of precautions;Decreased short-term memory   Safety/Judgement: Decreased awareness of safety;Decreased awareness of deficits Awareness: Emergent Problem Solving: Decreased initiation General Comments: Pt had great difficulty recalling her home situation and who helps her with what.  Chart states a neighbor helps with meds.  pt states she does her own meds.  Many conflicting stories with this pts care.    General Comments General comments (skin integrity, edema, etc.): Spoke with pt concerning d/c planning. She agrees that she would benefit from further rehab and additonal care at a SNF  but refuses to return to her previous SNF. She ideally would like to return home but agrees she will need additional help, is also open to idea of ALF.    Exercises        Assessment/Plan    PT Assessment Patient needs continued PT services  PT Diagnosis Difficulty walking;Acute pain;Generalized weakness   PT Problem List Decreased strength;Decreased range of motion;Decreased activity tolerance;Decreased balance;Decreased mobility;Decreased knowledge of use of DME;Decreased safety awareness;Decreased knowledge of precautions;Pain  PT Treatment Interventions DME instruction;Gait training;Functional mobility training;Therapeutic activities;Therapeutic exercise;Balance training;Neuromuscular re-education;Cognitive remediation;Patient/family education;Modalities   PT Goals (Current goals can be found in the Care Plan section) Acute Rehab PT Goals Patient Stated Goal: to go home. PT Goal Formulation: With patient Time For Goal Achievement: 01/24/15 Potential to Achieve Goals: Good    Frequency Min 3X/week   Barriers to discharge Decreased caregiver support lives alone Ind living facility.    Co-evaluation               End of Session Equipment Utilized During Treatment: Gait belt Activity Tolerance: Patient limited by pain Patient left: in bed;with call bell/phone within reach;with bed alarm set Nurse Communication: Mobility status         Time: 5361-4431 PT Time Calculation (min) (ACUTE ONLY): 29 min   Charges:   PT Evaluation $Initial PT Evaluation Tier I: 1 Procedure PT Treatments $Gait Training: 8-22 mins  PT G Codes:        Ellouise Newer 01/10/2015, 12:31 PM Camille [place], [place]

## 2015-01-10 NOTE — Progress Notes (Signed)
Pt adamantly refused learning to give her own insulin today. Claims "I am just not ready." Also, [name] son [name]) called and I spoke with him with pt's permission. He said she had previously been diagnosed with diabetes at the nursing home, and had refused all teaching at that time also.

## 2015-01-10 NOTE — Progress Notes (Signed)
Inpatient Diabetes Program Recommendations  AACE/ADA: New Consensus Statement on Inpatient Glycemic Control (2013)  Target Ranges:  Prepandial:   less than 140 mg/dL      Peak postprandial:   less than 180 mg/dL (1-2 hours)      Critically ill patients:  140 - 180 mg/dL     Results for [name], [name] (MRN [id number]) as of 01/10/2015 14:25  Ref. Range 01/10/2015 07:45 01/10/2015 13:04  Glucose-Capillary Latest Range: 70-99 mg/dL 200 (H) 126 (H)     **Attempted to speak with patient again today about her new diagnosis of DM.  Patient initially warm and receptive to my visit, however, quickly into our conversation, patient told me she did not want to take insulin at home (she only wants to take pills) and that she was upset her lunch hadn't arrived yet.  **Attepmpted to show patient how to use insulin pen at home.  Patient was lying low in bed and I asked of I could raise her head so she could sit up.  Patient refused and told me her head was far enough up already.  Placed demo insulin pen in patient's hands and attempted to have her run through a demonstration, however, patient would not look at me and would not follow directions and was unable to use the pen properly.  **Alerted RN caring for patient today to our interaction.  Asked RN to please have patient practice injecting self with insulin syringes while here in hospital.  Also spoke with Dr. Erlinda Hong and alerted Dr. Erlinda Hong to the conversation I had with patient as well.   Will follow Wyn Quaker RN, MSN, CDE Diabetes Coordinator Inpatient Diabetes Program Team Pager: [phone] (8a-10p)

## 2015-01-10 NOTE — Progress Notes (Signed)
[facility] TEAM 1 - Stepdown/ICU TEAM Progress Note  [name] CXK:[id number] DOB: Apr 11, 1946 DOA: 01/07/2015 PCP: No PCP Per Patient  Admit HPI / Brief Narrative: 69 y.o. 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 She was diagnosed with a UTI and was noted to have blood sugars in the 300s, although she had a normal anion gap and bicarbonate. The patient felt worse and came back to the ED for evaluation. The patient's blood sugars was noted to be greater than 700.   HPI/Subjective:  Patient is not motivated in participating care, flat affect, states not happy about the new diagnosis of diabetes. denies pain, no sob. Assessment/Plan:  DKA - newly diagnosed DM Will need to be discharged on insulin and oral agent due to a1c10.1, diabetic teaching on going. Transitioned off gtt - A1c 10.1 - my intention is for her to be d/c on insulin - perhaps with time she could be transitioned to oral tx in the outpt setting, but i doubt this will be successful - continue DM teaching, including use of CBG meter and insulin dosing and diet - given some existing concern for poor self care at home, she may require a more supportive environment at d/c - begin ASA - begin ACE - check lipids - strive for better CBG control prior to d/c home (bicarb currently falling again)  Depression?, not participating care, very flat affect, please call psychiatry in am for capacity eval and help patient coping with new diagnosis.  Hyponatremia  improved w/ NS resuscitation - stop IVF and follow Na trend   Dehydration  Resolve w/ NS resuscitation   COPD Gold III Stable/no wheezing today - no evidence of severe acute exacerbation   Stage III non-small cell squamous lung carcinoma Undergoing chemo infusion per  Dr. Julien Nordmann Q2 weeks via peripheral IV   Chronic pain Well compensated at present  Closed R humerus fx Dec 2015 Remains in sling - pain reasonably controlled - s/p ORIF 12/12/14 Ortho consulted, advise revision surgery as outpatient  HTN  Currently well controlled  Recent UTI Cx not successful in identifying bacteria - will complete previously prescribed course of abx  Code Status: FULL Family Communication: no family present at time of exam Disposition Plan: transfer to med bed - PT/OT evals - ongoing DM tx - need to see CBG more consistently controlled prior to d/c   Consultants: none  Procedures: none  Antibiotics: Keflex 1/10 >  DVT prophylaxis: lovenox  Objective: Blood pressure 132/64, pulse 71, temperature 98.8 F (37.1 C), temperature source Oral, resp. rate 15, height 5\' 6"  (1.676 m), weight 73.6 kg (162 lb 4.1 oz), SpO2 98 %.  Intake/Output Summary (Last 24 hours) at 01/10/15 1932 Last data filed at 01/10/15 9702  Gross per 24 hour  Intake    360 ml  Output    751 ml  Net   -391 ml   Exam: General: No acute respiratory distress - flat affect  Lungs: Clear to auscultation bilaterally without wheezes or crackles Cardiovascular: Regular rate and rhythm without murmur gallop or rub Abdomen: Nontender, nondistended, soft, bowel sounds positive, no rebound, no ascites, no appreciable mass Extremities: No significant cyanosis, clubbing, or edema bilateral lower extremities  Data Reviewed: Basic Metabolic Panel:  Recent Labs Lab 01/08/15 0235 01/08/15 1312 01/09/15 0003 01/09/15 1800 01/10/15 0719  NA 131* 133* 135 138 131*  K 3.5 3.3* 3.9 4.3 2.8*  CL 105 108 109 103 104  CO2 18* 20 17* 26 24  GLUCOSE 304* 96 196* 370* 191*  BUN 8 5* <5* <5* <5*  CREATININE 0.89 0.86 0.81 0.36* 0.59  CALCIUM 7.9* 8.2* 8.2* 7.9* 7.6*    Liver Function Tests: No results for input(s): AST, ALT, ALKPHOS, BILITOT, PROT, ALBUMIN in the last 168 hours. No results for  input(s): LIPASE, AMYLASE in the last 168 hours. No results for input(s): AMMONIA in the last 168 hours.   CBC:  Recent Labs Lab 01/07/15 1928 01/09/15 0003  WBC 5.4 4.8  NEUTROABS 4.1  --   HGB 12.1 11.2*  HCT 37.7 33.8*  MCV 103.3* 96.6  PLT 195 137*   CBG:  Recent Labs Lab 01/09/15 1617 01/09/15 2111 01/10/15 0745 01/10/15 1304 01/10/15 1811  GLUCAP 224* 163* 200* 126* 169*    Recent Results (from the past 240 hour(s))  Urine culture     Status: None   Collection Time: 01/01/15  1:50 PM  Result Value Ref Range Status   Specimen Description URINE, CATHETERIZED  Final   Special Requests NONE  Final   Colony Count   Final    75,000 COLONIES/ML Performed at [facility]    Culture   Final    Multiple bacterial morphotypes present, none predominant. Suggest appropriate recollection if clinically indicated. Performed at [facility]    Report Status 01/03/2015 FINAL  Final     Studies:  Recent x-ray studies have been reviewed in detail by the Attending Physician  Scheduled Meds:  Scheduled Meds: . alprazolam  2 mg Oral TID  . atenolol  50 mg Oral Daily  . enoxaparin (LOVENOX) injection  40 mg Subcutaneous Daily  . insulin aspart  0-20 Units Subcutaneous TID WC  . insulin aspart  0-5 Units Subcutaneous QHS  . insulin aspart  3 Units Subcutaneous TID WC  . insulin glargine  20 Units Subcutaneous QHS  . levothyroxine  50 mcg Oral QAC breakfast  . lisinopril  5 mg Oral Daily  . pantoprazole  40 mg Oral Daily  . sertraline  200 mg Oral Daily  . tiotropium  18 mcg Inhalation Daily  . trazodone  300 mg Oral QHS    Time spent on care of this patient: 75 mins   [name] , MD   [facility] Office  [phone] Pager - Text Page per Shea Evans as per below:  On-Call/Text Page:      [email or web address]      password [email or web address]  If 7PM-7AM, please contact night-coverage [email or web address] Password [email or web address] 01/10/2015, 7:32 PM   LOS: 3 days

## 2015-01-11 DIAGNOSIS — I10 Essential (primary) hypertension: Secondary | ICD-10-CM

## 2015-01-11 LAB — BASIC METABOLIC PANEL
Anion gap: 10 (ref 5–15)
BUN: <5 mg/dL — ABNORMAL LOW (ref 6–23)
CO2: 25 mmol/L (ref 19–32)
Calcium: 8.2 mg/dL — ABNORMAL LOW (ref 8.4–10.5)
Chloride: 97 mEq/L (ref 96–112)
Creatinine, Ser: 0.75 mg/dL (ref 0.50–1.10)
GFR calc Af Amer: >90 mL/min (ref >90)
GFR calc non Af Amer: 86 mL/min — ABNORMAL LOW (ref >90)
Glucose, Bld: 188 mg/dL — ABNORMAL HIGH (ref 70–99)
Potassium: 4 mmol/L (ref 3.5–5.1)
SODIUM: 132 mmol/L — AB (ref 135–145)

## 2015-01-11 LAB — GLUCOSE, CAPILLARY
Glucose-Capillary: 185 mg/dL — ABNORMAL HIGH (ref 70–99)
Glucose-Capillary: 101 mg/dL — ABNORMAL HIGH (ref 70–99)
Glucose-Capillary: 186 mg/dL — ABNORMAL HIGH (ref 70–99)

## 2015-01-11 MED ORDER — FLUCONAZOLE 100 MG PO TABS
100.0000 mg | ORAL_TABLET | Freq: Every day | ORAL | Status: DC
  Administered 2015-01-11: 100 mg via ORAL
  Filled 2015-01-11: qty 1

## 2015-01-11 MED ORDER — FREESTYLE SYSTEM KIT
1.0000 | PACK | Freq: Three times a day (TID) | Status: DC
Start: 2015-01-11 — End: 2015-03-10

## 2015-01-11 MED ORDER — INSULIN PEN NEEDLE 30G X 8 MM MISC: 1.0000 | Freq: Four times a day (QID) | Status: DC

## 2015-01-11 MED ORDER — FLUCONAZOLE 100 MG PO TABS: 100.0000 mg | ORAL_TABLET | Freq: Every day | ORAL | Status: DC

## 2015-01-11 MED ORDER — INSULIN GLARGINE 100 UNIT/ML SOLOSTAR PEN: 20.0000 [IU] | PEN_INJECTOR | Freq: Every day | SUBCUTANEOUS | Status: DC

## 2015-01-11 MED ORDER — NYSTATIN 100000 UNIT/GM EX OINT: TOPICAL_OINTMENT | Freq: Three times a day (TID) | CUTANEOUS | Status: DC

## 2015-01-11 MED ORDER — INSULIN ASPART 100 UNIT/ML FLEXPEN: 3.0000 [IU] | PEN_INJECTOR | Freq: Three times a day (TID) | SUBCUTANEOUS | Status: DC

## 2015-01-11 MED ORDER — NYSTATIN 100000 UNIT/GM EX OINT
TOPICAL_OINTMENT | Freq: Three times a day (TID) | CUTANEOUS | Status: DC
Start: 2015-01-11 — End: 2015-01-11
  Administered 2015-01-11: 15:00:00 via TOPICAL
  Filled 2015-01-11: qty 15

## 2015-01-11 MED ORDER — LISINOPRIL 5 MG PO TABS: 5.0000 mg | ORAL_TABLET | Freq: Every day | ORAL | Status: DC

## 2015-01-11 NOTE — Progress Notes (Signed)
CARE MANAGEMENT NOTE 01/11/2015  Patient:  [name], [name]   Account Number:  [id number]  Date Initiated:  01/11/2015  Documentation initiated by:  [facility]  Subjective/Objective Assessment:   DKA - newly diagnosed DM     Action/Plan:   Anticipated DC Date:  01/11/2015   Anticipated DC Plan:  [place]  CM consult      [facility] Choice  HOME HEALTH   Choice offered to / List presented to:  C-1 Patient   DME arranged  Vassie Moselle      DME agency  [place] arranged  HH-1 RN  [place]      [place] agency  [place]   Status of service:  Completed, signed off Medicare Important Message given?  YES (If response is "NO", the following Medicare IM given date fields will be blank) Date Medicare IM given:  01/11/2015 Medicare IM given by:  [facility] Date Additional Medicare IM given:   Additional Medicare IM given by:    Discharge Disposition:  [place]  Per UR Regulation:    If discussed at Long Length of Stay Meetings, dates discussed:    Comments:  01/11/2014 1100 NCM spoke to pt and offered choice for [facility]. Pt states she had [facility] in the past. Requested RW for home. Pt states she has caregiver in the home, Harl [facility]) [phone]. Contacted [facility] for RW for home. Has neb machine at home. Jonnie Finner RN CCM Case Mgmt phone [phone]

## 2015-01-11 NOTE — Progress Notes (Signed)
Inpatient Diabetes Program Recommendations  AACE/ADA: New Consensus Statement on Inpatient Glycemic Control (2013)  Target Ranges:  Prepandial:   less than 140 mg/dL      Peak postprandial:   less than 180 mg/dL (1-2 hours)      Critically ill patients:  140 - 180 mg/dL     Results for STACEE, EARP (MRN [id number]) as of 01/11/2015 11:47  Ref. Range 01/10/2015 07:45 01/10/2015 13:04 01/10/2015 18:11 01/10/2015 21:30  Glucose-Capillary Latest Range: 70-99 mg/dL 200 (H) 126 (H) 169 (H) 96     **Spoke with patient again today about using insulin at home.  Patient much more receptive to my visit today.   **Educated patient on insulin pen use at home.  Reviewed contents of insulin starter kit.  Reviewed all steps of insulin pen including attachment of needle, 2-unit air shot, dialing up dose, giving injection, removing needle, disposal of sharps, storage of unused insulin, disposal of insulin etc.  Patient able to provide semi-successful return demonstration.  Likely needs assistance at home with insulin in the beginning stages.    **MD- Would it be possible to have a [place] visit with patient daily until she is able to do her insulin on her own??  Patient was able to follow all steps, however, she became increasingly stressed and frustrated the more time I spent with her.    **Also reviewed troubleshooting with insulin pen.  MD to give patient Rxs for insulin pens and insulin pen needles.  **Asked RN to please allow patient to give one or two insulin injections before she is discharged.   MD- When placing orders for insulin at time of d/c, please give patient Rxs for insulin pens.    Lantus Solostar insulin pen [Order #[id number]] Novolog Flexpen [Order #[id number]] Insulin pen needles- 31 gauge x 32m  [Order # [id number]]CBG Meter Rx [Order # [id number]]  Will follow JWyn QuakerRN, MSN, CDE Diabetes Coordinator Inpatient Diabetes Program Team Pager: [phone](8a-10p)

## 2015-01-11 NOTE — Progress Notes (Signed)
CARE MANAGEMENT NOTE 01/11/2015  Patient:  DALYLAH, RAMEY   Account Number:  [id number]  Date Initiated:  01/11/2015  Documentation initiated by:  [facility]  Subjective/Objective Assessment:   DKA - newly diagnosed DM     Action/Plan:   Anticipated DC Date:  01/11/2015   Anticipated DC Plan:  [place]  CM consult      [facility] Choice  HOME HEALTH   Choice offered to / List presented to:  C-1 Patient   DME arranged  Vassie Moselle      DME agency  [place] arranged  HH-1 RN  [place]      [place] agency  [place]   Status of service:  Completed, signed off Medicare Important Message given?  YES (If response is "NO", the following Medicare IM given date fields will be blank) Date Medicare IM given:  01/11/2015 Medicare IM given by:  [facility] Date Additional Medicare IM given:   Additional Medicare IM given by:    Discharge Disposition:  Home  Per UR Regulation:    If discussed at Long Length of Stay Meetings, dates discussed:    Comments:  01/11/2014 1100 NCM spoke to pt and offered choice for [facility]. Pt states she had [facility] in the past. Requested RW for home. Pt states she has caregiver in the home, Harl [facility]) [phone]. Contacted [facility] for RW for home. Jonnie Finner RN CCM Case Mgmt phone [phone]

## 2015-01-11 NOTE — Clinical Social Work Note (Signed)
CSW met with patient at bedside. Patient continues to refuse SNF placement. Patient states that she would consider going SNF from home but will not consider placement from the hospital. CSW provided patient with SNF list and explained that a social worker can be arranged to assist with placement from home. CSW has updated RNCM. CSW signing off at this time.]    Liz Beach MSW, Latanya Presser [facility], [phone]

## 2015-01-11 NOTE — Discharge Summary (Signed)
Physician Discharge Summary  Claretta D B[place]acqua MRN:[id number] DOB: 01/19/1946 DOA: 01/07/2015  PCP: No PCP Per Patient  Admit date: 01/07/2015 Discharge date: 01/11/2015  Time spent: 50 minutes  Recommendations for Outpatient Follow-up:  1. F/u closely on glucose management   Discharge Condition: stable Diet recommendation: heart healthy and diabetic diet.   Discharge Diagnoses:  Principal Problem:   DKA, type 2, not at goal Active Problems:   Essential hypertension, benign   Hyponatremia   Dehydration   History of present illness:  69 y.o. 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 She was diagnosed with a UTI and was noted to have blood sugars in the 300s, although she had a normal anion gap and bicarbonate. The patient felt worse and came back to the ED for evaluation. The patient's blood sugars was noted to be greater than 700.   Hospital Course:  DKA - newly diagnosed DM Will need to be discharged on insulin and oral agent due to [id number], diabetic teaching completed- to go home with insulin pens and home health. Extensive teaching done. Patient is agreeable with plan although she was very hesitant to accept it initially.   Hyponatremia  improved w/ NS resuscitation -   Dehydration  Resolve w/ NS resuscitation   COPD Gold III Stable/no wheezing- no evidence of severe acute exacerbation   Stage III non-small cell squamous lung carcinoma Undergoing chemo infusion per Dr. Mohamed Q2 weeks via peripheral IV   Chronic pain Well compensated at present  Closed R humerus fx Dec 2015 Remains in sling - pain reasonably controlled - s/p ORIF 12/12/14 Ortho consulted, advise revision surgery as outpatient  HTN  Currently well controlled  Recent UTI Cx not  successful in identifying bacteria - will complete previously prescribed course of abx    Discharge Exam: Filed Weights   01/07/15 1808 01/08/15 0407  Weight: 72.576 kg (160 lb) 73.6 kg (162 lb 4.1 oz)   Filed Vitals:   01/11/15 0819  BP: 147/79  Pulse: 72  Temp:   Resp: 16    General: AAO x 3, no distress Cardiovascular: RRR, no murmurs  Respiratory: clear to auscultation bilaterally GI: soft, non-tender, non-distended, bowel sound positive  Discharge Instructions You were cared for by a hospitalist during your hospital stay. If you have any questions about your discharge medications or the care you received while you were in the hospital after you are discharged, you can call the unit and asked to speak with the hospitalist on call if the hospitalist that took care of you is not available. Once you are discharged, your primary care physician will handle any further medical issues. Please note that NO REFILLS for any discharge medications will be authorized once you are discharged, as it is imperative that you return to your primary care physician (or establish a relationship with a primary care physician if you do not have one) for your aftercare needs so that they can reassess your need for medications and monitor your lab values.      Discharge Instructions    Diet - low sodium heart healthy    Complete by:  As directed   And diabetic diet.     Increase activity slowly    Complete by:  As directed             Medication   List    STOP taking these medications        amoxicillin-clavulanate 875-125 MG per tablet  Commonly known as:  AUGMENTIN     cephALEXin 500 MG capsule  Commonly known as:  KEFLEX     doxycycline 100 MG tablet  Commonly known as:  VIBRA-TABS      TAKE these medications        Aclidinium Bromide 400 MCG/ACT Aepb  Commonly known as:  TUDORZA PRESSAIR  Inhale 1 puff into the lungs 2 (two) times daily.     albuterol 108 (90 BASE) MCG/ACT  inhaler  Commonly known as:  VENTOLIN HFA  Inhale 2 puffs into the lungs every 6 (six) hours as needed for wheezing or shortness of breath.     albuterol (2.5 MG/3ML) 0.083% nebulizer solution  Commonly known as:  PROVENTIL  Take 2.5 mg by nebulization as needed for wheezing.     alprazolam 2 MG tablet  Commonly known as:  XANAX  Take 2 mg by mouth 3 (three) times daily as needed for sleep or anxiety.     aspirin 325 MG EC tablet  Take 1 tablet (325 mg total) by mouth 2 (two) times daily.     atenolol 50 MG tablet  Commonly known as:  TENORMIN  Take 1 tablet (50 mg total) by mouth daily.     benzonatate 200 MG capsule  Commonly known as:  TESSALON  Take 1 capsule (200 mg total) by mouth 3 (three) times daily as needed for cough.     chlorzoxazone 500 MG tablet  Commonly known as:  PARAFON  Take 500 mg by mouth 4 (four) times daily as needed for muscle spasms.     dextromethorphan-guaiFENesin 30-600 MG per 12 hr tablet  Commonly known as:  MUCINEX DM  Take 1 tablet by mouth every 12 (twelve) hours.     DSS 100 MG Caps  Take 100 mg by mouth 2 (two) times daily.     esomeprazole 40 MG capsule  Commonly known as:  NEXIUM  Take 40 mg by mouth daily.     fluconazole 100 MG tablet  Commonly known as:  DIFLUCAN  Take 1 tablet (100 mg total) by mouth daily.     glucose monitoring kit monitoring kit  1 each by Does not apply route 4 (four) times daily - after meals and at bedtime. 1 month Diabetic Testing Supplies for QAC-QHS accuchecks.     insulin aspart 100 UNIT/ML FlexPen  Commonly known as:  NOVOLOG FLEXPEN  Inject 3 Units into the skin 3 (three) times daily with meals.     Insulin Glargine 100 UNIT/ML Solostar Pen  Commonly known as:  LANTUS SOLOSTAR  Inject 20 Units into the skin daily at 10 pm.     Insulin Pen Needle 30G X 8 MM Misc  Commonly known as:  NOVOFINE  Inject 10 each into the skin 4 (four) times daily.     levothyroxine 50 MCG tablet  Commonly known  as:  SYNTHROID  Take 1 tablet (50 mcg total) by mouth daily before breakfast.     lisinopril 5 MG tablet  Commonly known as:  PRINIVIL,ZESTRIL  Take 1 tablet (5 mg total) by mouth daily.     nystatin ointment  Commonly known as:  MYCOSTATIN  Apply topically 3 (three) times daily.     ondansetron 4 MG disintegrating tablet  Commonly known as:  ZOFRAN ODT  Take 1 tablet (4 mg total) by mouth every 8 (  eight) hours as needed for nausea or vomiting.     oxyCODONE-acetaminophen 5-325 MG per tablet  Commonly known as:  ROXICET  Take 1-2 tablets by mouth every 4 (four) hours as needed for severe pain.     sertraline 100 MG tablet  Commonly known as:  ZOLOFT  Take 200 mg by mouth daily.     tiotropium 18 MCG inhalation capsule  Commonly known as:  SPIRIVA  Place 1 capsule (18 mcg total) into inhaler and inhale daily.     trazodone 300 MG tablet  Commonly known as:  DESYREL  Take 300 mg by mouth at bedtime.       Allergies  Allergen Reactions  . Carafate [Sucralfate] Nausea Only  . Prednisone Other (See Comments)    insomnia  . Nsaids     GI Upset  . Aspirin Other (See Comments)    Gi symptoms. Does NOT take ibuprofen or other NSAIDS  . Ibuprofen     Gi upset  . Dulera [Mometasone Furo-Formoterol Fum] Palpitations    Panting " my body was twisted inside and out"   Follow-up Information    Follow up with [facility].   Specialty:  Home Health Services   Why:  Home Health RN, and Social Worker   Contact information:   [place] [address] [phone]        The results of significant diagnostics from this hospitalization (including imaging, microbiology, ancillary and laboratory) are listed below for reference.    Significant Diagnostic Studies: Dg Chest 1 View  01/01/2015   CLINICAL DATA:  Fall yesterday. LEFT shoulder surgery through X ago. Weakness. History of lung cancer.  EXAM: CHEST - 1 VIEW  COMPARISON:  Multiple priors dating  back to chest CT 11/21/2014.  FINDINGS: Cardiopericardial silhouette within normal limits for size. Tortuous calcified aortic arch. Lower cervical ACDF. LEFT shoulder hemiarthroplasty and RIGHT shoulder buttress plate and screw fixation.  There is no pneumothorax. No displaced rib fractures are identified. The RIGHT hilar nodule is poorly visible radiographically but appears similar to the prior exams. Cicatricial changes are present in the RIGHT infrahilar region, probably secondary to radiation therapy.  No airspace disease. No pleural effusion. Monitoring leads project over the chest.  IMPRESSION: Chronic findings of the chest without acute cardiopulmonary disease. RIGHT infrahilar nodule and probable post radiation changes of the RIGHT middle and RIGHT lower lobe appear similar to prior studies.   Electronically Signed   By: Dereck Ligas M.D.   On: 01/01/2015 14:19   Dg Shoulder Right  12/12/2014   CLINICAL DATA:  ORIF right shoulder.  EXAM: RIGHT SHOULDER - 2+ VIEW  COMPARISON:  12/10/2014  FINDINGS: Initial image demonstrates evidence of patient's moderately displaced humeral head and neck fracture. Subsequent images demonstrate lateral fixation plate with associated screws bridging patient's head/neck fracture as there is anatomic alignment about the fracture site and glenohumeral joint. Hardware is intact.  IMPRESSION: Fixation patient's humeral head and neck fracture with hardware intact and anatomic alignment about the fracture site.   Electronically Signed   By: Marin Olp M.D.   On: 12/12/2014 17:33   Dg Chest Port 1 View  12/15/2014   CLINICAL DATA:  Postop shoulder surgery. Fever and cough. History lung cancer.  EXAM: PORTABLE CHEST - 1 VIEW  COMPARISON:  12/12/2014  FINDINGS: Progression of right lower lobe airspace disease, possible pneumonia versus atelectasis. Small right effusion has developed.  Mild left lower lobe atelectasis also slightly more prominent.  No definite heart failure.   Postop changes right shoulder.  Postop left shoulder replacement.  IMPRESSION: Progressive airspace disease in the right lower lobe, possible pneumonia and small effusion.  Mild left lower lobe atelectasis also slightly progressive.   Electronically Signed   By: Franchot Gallo M.D.   On: 12/15/2014 11:45   Dg Knee 4 Views W/patella Right  12/14/2014   CLINICAL DATA:  Inj,fell 4 days ago,acute rt knee pain at knee cap,swelling as well  EXAM: RIGHT KNEE - COMPLETE 4+ VIEW  COMPARISON:  12/12/2014  FINDINGS: Diffuse osteopenia. Chondrocalcinosis in medial and lateral compartments. Negative for fracture, dislocation, or other acute bone abnormality. No effusion. Patchy femoral-popliteal arterial calcifications.  IMPRESSION: 1. Negative for fracture or other acute bone abnormality. 2. Chondrocalcinosis suggesting CPPD.   Electronically Signed   By: Arne Cleveland M.D.   On: 12/14/2014 09:48   Dg C-arm 61-120 Min-no Report  12/12/2014   CLINICAL DATA: ORIF proximal humerus, right   C-ARM 61-120 MINUTES  Fluoroscopy was utilized by the requesting physician.  No radiographic  interpretation.     Microbiology: No results found for this or any previous visit (from the past 240 hour(s)).   Labs: Basic Metabolic Panel:  Recent Labs Lab 01/08/15 1312 01/09/15 0003 01/09/15 1800 01/10/15 0719 01/11/15 0830  NA 133* 135 138 131* 132*  K 3.3* 3.9 4.3 2.8* 4.0  CL 108 109 103 104 97  CO2 20 17* _0 GLUCOSE 96 196* 370* 191* 188*  BUN 5* <5* <5* <5* <5*  CREATININE 0.86 0.81 0.36* 0.59 0.75  CALCIUM 8.2* 8.2* 7.9* 7.6* 8.2*   Liver Function Tests: No results for input(s): AST, ALT, ALKPHOS, BILITOT, PROT, ALBUMIN in the last 168 hours. No results for input(s): LIPASE, AMYLASE in the last 168 hours. No results for input(s): AMMONIA in the last 168 hours. CBC:  Recent Labs Lab 01/07/15 1928 01/09/15 0003  WBC 5.4 4.8  NEUTROABS 4.1  --   HGB 12.1 11.2*  HCT 37.7 33.8*  MCV 103.3*  96.6  PLT 195 137*   Cardiac Enzymes: No results for input(s): CKTOTAL, CKMB, CKMBINDEX, TROPONINI in the last 168 hours. BNP: BNP (last 3 results) No results for input(s): PROBNP in the last 8760 hours. CBG:  Recent Labs Lab 01/10/15 1304 01/10/15 1811 01/10/15 2130 01/11/15 0755 01/11/15 1246  GLUCAP 126* 169* 96 185* 101*       SignedDebbe Odea, MD [facility] 01/11/2015, 3:51 PM

## 2015-01-12 ENCOUNTER — Inpatient Hospital Stay (HOSPITAL_COMMUNITY): Admission: RE | Admit: 2015-01-12 | Payer: Medicare Other | Source: Ambulatory Visit | Admitting: Orthopedic Surgery

## 2015-01-12 ENCOUNTER — Encounter (HOSPITAL_BASED_OUTPATIENT_CLINIC_OR_DEPARTMENT_OTHER): Payer: Self-pay | Admitting: Gynecologic Oncology

## 2015-01-12 ENCOUNTER — Encounter (HOSPITAL_COMMUNITY): Admission: RE | Payer: Self-pay | Source: Ambulatory Visit

## 2015-01-12 SURGERY — ARTHROPLASTY, SHOULDER, TOTAL, REVERSE
Anesthesia: Choice | Laterality: Right

## 2015-01-12 NOTE — Care Management Note (Signed)
    Page 1 of 2   01/12/2015     10:30:42 AM CARE MANAGEMENT NOTE 01/12/2015  Patient:  [name], [name]   Account Number:  [id number]  Date Initiated:  01/11/2015  Documentation initiated by:  [facility]  Subjective/Objective Assessment:   DKA - newly diagnosed DM     Action/Plan:   Anticipated DC Date:  01/11/2015   Anticipated DC Plan:  [place]  CM consult      [facility] Choice  HOME HEALTH   Choice offered to / List presented to:  C-1 Patient   DME arranged  Vassie Moselle      DME agency  [place] arranged  HH-1 RN  [place]      [place].   Status of service:  Completed, signed off Medicare Important Message given?  YES (If response is "NO", the following Medicare IM given date fields will be blank) Date Medicare IM given:  01/11/2015 Medicare IM given by:  [facility] Date Additional Medicare IM given:   Additional Medicare IM given by:    Discharge Disposition:  [place]  Per UR Regulation:  Reviewed for med. necessity/level of care/duration of stay  If discussed at [place] of Stay Meetings, dates discussed:    Comments:  01/12/15 Page, BSN [phone] NCM spoke with patient and informed her that [facility] will be coming out to see her , she will need additional help, and [facility] is the [facility] agency this week.  She was set up with [facility] but per Medical Advisor patient would be considered [facility].  01/11/2014 1100 NCM spoke to pt and offered choice for [facility]. Pt states she had [facility] in the past. Requested RW for home. Pt states she has caregiver in the home, Harl Favor [facility]) [phone]. Contacted [facility] for RW for home. Has neb machine at home. Jonnie Finner RN CCM Case Mgmt phone [phone]

## 2015-01-23 ENCOUNTER — Ambulatory Visit (INDEPENDENT_AMBULATORY_CARE_PROVIDER_SITE_OTHER): Payer: Medicare Other | Admitting: Psychiatry

## 2015-01-23 ENCOUNTER — Encounter (HOSPITAL_COMMUNITY): Payer: Self-pay | Admitting: Psychiatry

## 2015-01-23 VITALS — BP 118/76 | HR 80 | Ht 66.0 in | Wt 156.0 lb

## 2015-01-23 DIAGNOSIS — F329 Major depressive disorder, single episode, unspecified: Secondary | ICD-10-CM

## 2015-01-23 DIAGNOSIS — F411 Generalized anxiety disorder: Secondary | ICD-10-CM

## 2015-01-23 DIAGNOSIS — F331 Major depressive disorder, recurrent, moderate: Secondary | ICD-10-CM

## 2015-01-23 MED ORDER — SERTRALINE HCL 100 MG PO TABS: 200.0000 mg | ORAL_TABLET | Freq: Every day | ORAL | Status: DC

## 2015-01-23 MED ORDER — ALPRAZOLAM 2 MG PO TABS: 2.0000 mg | ORAL_TABLET | Freq: Two times a day (BID) | ORAL | Status: DC

## 2015-01-23 MED ORDER — TRAZODONE HCL 300 MG PO TABS: 300.0000 mg | ORAL_TABLET | Freq: Every day | ORAL | Status: DC
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
## 2015-02-06 ENCOUNTER — Telehealth: Payer: Self-pay | Admitting: *Deleted

## 2015-02-06 ENCOUNTER — Telehealth: Payer: Self-pay | Admitting: Internal Medicine

## 2015-02-06 NOTE — Telephone Encounter (Signed)
lvm for pt regarding to Feb appt....mailed pt appt sched and letter

## 2015-02-06 NOTE — Telephone Encounter (Signed)
Called to f/u with patient.  Pt is interested in f/u with Dr Vista Mink.  Onc tx schedule filled out to schedule appts.

## 2015-02-07 ENCOUNTER — Telehealth: Payer: Self-pay | Admitting: Internal Medicine

## 2015-02-07 NOTE — Telephone Encounter (Signed)
returned call to r/s....no vm....did not change appts no availabe time on 2.23

## 2015-02-12 ENCOUNTER — Emergency Department (HOSPITAL_COMMUNITY)
Admission: EM | Admit: 2015-02-12 | Discharge: 2015-02-12 | Disposition: A | Discharge status: Home / Self Care | Payer: Medicare Other | Attending: Emergency Medicine | Admitting: Emergency Medicine

## 2015-02-12 ENCOUNTER — Encounter (HOSPITAL_COMMUNITY): Payer: Self-pay

## 2015-02-12 ENCOUNTER — Emergency Department (HOSPITAL_COMMUNITY): Payer: Medicare Other

## 2015-02-12 DIAGNOSIS — J441 Chronic obstructive pulmonary disease with (acute) exacerbation: Secondary | ICD-10-CM | POA: Insufficient documentation

## 2015-02-12 DIAGNOSIS — E1165 Type 2 diabetes mellitus with hyperglycemia: Secondary | ICD-10-CM | POA: Insufficient documentation

## 2015-02-12 DIAGNOSIS — R630 Anorexia: Secondary | ICD-10-CM | POA: Insufficient documentation

## 2015-02-12 DIAGNOSIS — R52 Pain, unspecified: Secondary | ICD-10-CM

## 2015-02-12 DIAGNOSIS — R197 Diarrhea, unspecified: Secondary | ICD-10-CM | POA: Diagnosis not present

## 2015-02-12 DIAGNOSIS — R739 Hyperglycemia, unspecified: Secondary | ICD-10-CM

## 2015-02-12 DIAGNOSIS — I10 Essential (primary) hypertension: Secondary | ICD-10-CM | POA: Diagnosis not present

## 2015-02-12 DIAGNOSIS — M6281 Muscle weakness (generalized): Secondary | ICD-10-CM | POA: Diagnosis not present

## 2015-02-12 DIAGNOSIS — R110 Nausea: Secondary | ICD-10-CM | POA: Insufficient documentation

## 2015-02-12 DIAGNOSIS — Z8589 Personal history of malignant neoplasm of other organs and systems: Secondary | ICD-10-CM | POA: Insufficient documentation

## 2015-02-12 HISTORY — DX: Malignant (primary) neoplasm, unspecified: C80.1

## 2015-02-12 HISTORY — DX: Type 2 diabetes mellitus without complications: E11.9

## 2015-02-12 LAB — CBC WITH DIFFERENTIAL/PLATELET
BASOS PCT: 1 % (ref 0–1)
Basophils Absolute: 0 10*3/uL (ref 0.0–0.1)
Eosinophils Absolute: 0 10*3/uL (ref 0.0–0.7)
Eosinophils Relative: 1 % (ref 0–5)
HCT: 35.9 % — ABNORMAL LOW (ref 36.0–46.0)
Hemoglobin: 11.9 g/dL — ABNORMAL LOW (ref 12.0–15.0)
Lymphocytes Relative: 30 % (ref 12–46)
Lymphs Abs: 0.9 10*3/uL (ref 0.7–4.0)
MCH: 33.2 pg (ref 26.0–34.0)
MCHC: 33.1 g/dL (ref 30.0–36.0)
MCV: 100.3 fL — AB (ref 78.0–100.0)
Monocytes Absolute: 0.2 10*3/uL (ref 0.1–1.0)
Monocytes Relative: 8 % (ref 3–12)
NEUTROS ABS: 1.7 10*3/uL (ref 1.7–7.7)
NEUTROS PCT: 60 % (ref 43–77)
Platelets: 153 10*3/uL (ref 150–400)
RBC: 3.58 MIL/uL — ABNORMAL LOW (ref 3.87–5.11)
RDW: 13.5 % (ref 11.5–15.5)
WBC: 2.9 10*3/uL — ABNORMAL LOW (ref 4.0–10.5)

## 2015-02-12 LAB — URINALYSIS, ROUTINE W REFLEX MICROSCOPIC
Bilirubin Urine: NEGATIVE
Glucose, UA: >1000 mg/dL — AB
HGB URINE DIPSTICK: NEGATIVE
Ketones, ur: NEGATIVE mg/dL
Leukocytes, UA: NEGATIVE
Nitrite: NEGATIVE
PH: 6.5 (ref 5.0–8.0)
Protein, ur: NEGATIVE mg/dL
SPECIFIC GRAVITY, URINE: 1.015 (ref 1.005–1.030)
UROBILINOGEN UA: 0.2 mg/dL (ref 0.0–1.0)

## 2015-02-12 LAB — COMPREHENSIVE METABOLIC PANEL
ALBUMIN: 3.5 g/dL (ref 3.5–5.2)
ALT: 7 U/L (ref 0–35)
AST: 13 U/L (ref 0–37)
Alkaline Phosphatase: 125 U/L — ABNORMAL HIGH (ref 39–117)
Anion gap: 6 (ref 5–15)
BUN: 9 mg/dL (ref 6–23)
CO2: 30 mmol/L (ref 19–32)
Calcium: 8.3 mg/dL — ABNORMAL LOW (ref 8.4–10.5)
Chloride: 96 mmol/L (ref 96–112)
Creatinine, Ser: 0.83 mg/dL (ref 0.50–1.10)
GFR calc Af Amer: 83 mL/min — ABNORMAL LOW (ref >90)
GFR calc non Af Amer: 71 mL/min — ABNORMAL LOW (ref >90)
Glucose, Bld: 437 mg/dL — ABNORMAL HIGH (ref 70–99)
POTASSIUM: 3.6 mmol/L (ref 3.5–5.1)
Sodium: 132 mmol/L — ABNORMAL LOW (ref 135–145)
Total Bilirubin: 0.6 mg/dL (ref 0.3–1.2)
Total Protein: 6.3 g/dL (ref 6.0–8.3)

## 2015-02-12 LAB — URINE MICROSCOPIC-ADD ON

## 2015-02-12 LAB — I-STAT CG4 LACTIC ACID, ED: Lactic Acid, Venous: 0.81 mmol/L (ref 0.5–2.0)

## 2015-02-12 LAB — CBG MONITORING, ED
Glucose-Capillary: 453 mg/dL — ABNORMAL HIGH (ref 70–99)
Glucose-Capillary: 257 mg/dL — ABNORMAL HIGH (ref 70–99)

## 2015-02-12 LAB — LIPASE, BLOOD: Lipase: 21 U/L (ref 11–59)

## 2015-02-12 MED ORDER — INSULIN ASPART 100 UNIT/ML ~~LOC~~ SOLN
10.0000 [IU] | Freq: Once | SUBCUTANEOUS | Status: AC
  Administered 2015-02-12: 10 [IU] via INTRAVENOUS
  Filled 2015-02-12: qty 1

## 2015-02-12 MED ORDER — OXYCODONE-ACETAMINOPHEN 10-325 MG PO TABS
1.0000 | ORAL_TABLET | Freq: Four times a day (QID) | ORAL | Status: DC | PRN
Start: 2015-02-12 — End: 2015-02-17

## 2015-02-12 MED ORDER — SODIUM CHLORIDE 0.9 % IV BOLUS (SEPSIS)
1000.0000 mL | Freq: Once | INTRAVENOUS | Status: AC
  Administered 2015-02-12: 1000 mL via INTRAVENOUS

## 2015-02-12 NOTE — ED Notes (Signed)
Pt states she has no one available to come pick her up from hospital, requesting EMS transport home. [facility] called.

## 2015-02-12 NOTE — ED Notes (Signed)
Patient presents via EMS, pt c/o of increased CBG greater than 700 since yesterday. States she took 8 units of insulin this am (normal dosage is 5), rechecked at lunch which was greater than 700, took no insulin and did not eat at that time. CBG 440 in route.

## 2015-02-12 NOTE — Discharge Instructions (Signed)
As discussed, your evaluation today has been largely reassuring.  But, it is important that you monitor your condition carefully, and do not hesitate to return to the ED if you develop new, or concerning changes in your condition.  Otherwise, please follow-up with your physician for appropriate ongoing care.  Please be sure to discuss your need for pain management, as well as appropriate antigen of your diabetes.

## 2015-02-12 NOTE — ED Notes (Signed)
Pt ambulatory to restroom with walker 

## 2015-02-12 NOTE — ED Notes (Signed)
Per EMS, patient has been admitted to numerous long term care facilities in the area, and has repeatedly signed herself out. Per EMS patient has a history of polysubstance abuse and elicit drug abuse which included marijuana.

## 2015-02-12 NOTE — ED Provider Notes (Signed)
CSN: [id number]     Arrival date & time 02/12/15  1359 History  This chart was scribed for Carmin Muskrat, MD by Zola Button, ED Scribe. This patient was seen in room APA12/APA12 and the patient's care was started at 2:44 PM.       Chief Complaint  Patient presents with  . Hyperglycemia   The history is provided by the patient. No language interpreter was used.   HPI Comments: [name] is a 69 y.o. female brought in by EMS with a hx of lung cancer, HTN, COPD and DM who presents to the Emergency Department complaining of hyperglycemia, with increased CBG greater than 700 since yesterday per EMS. Patient states that she has been having symptoms that started 2-3 days ago, which include loss of appetite, generalized weakness, nausea and diarrhea. She currently lives by herself; she had a home health helper until he/she 4-5 days ago. She states that she smokes occasionally. Patient has active lung cancer, but is not currently on therapy. Her oncologist is Dr. Julien Nordmann.   Patient has ongoing problem of bilateral knee pain; she states she needs knee replacement for both of her knees.   Past Medical History  Diagnosis Date  . Diabetes mellitus without complication   . Cancer   . Hypertension   . COPD (chronic obstructive pulmonary disease)    No past surgical history on file. No family history on file. History  Substance Use Topics  . Smoking status: Never Smoker   . Smokeless tobacco: Not on file  . Alcohol Use: No   OB History    No data available     Review of Systems  Constitutional:       Per HPI, otherwise negative  HENT:       Per HPI, otherwise negative  Respiratory:       Per HPI, otherwise negative  Cardiovascular:       Per HPI, otherwise negative  Gastrointestinal: Negative for vomiting.  Endocrine:       Negative aside from HPI  Genitourinary:       Neg aside from HPI   Musculoskeletal:       Per HPI, otherwise negative  Skin: Negative.   Neurological:  Negative for syncope.      Allergies  Aspirin and Nsaids  Home Medications   Prior to Admission medications   Not on File   BP 175/92 mmHg  Pulse 76  Temp(Src) 97.2 F (36.2 C) (Oral)  Resp 19  Ht 5\' 6"  (1.676 m)  Wt 150 lb (68.04 kg)  BMI 24.22 kg/m2  SpO2 93% Physical Exam  Constitutional: She is oriented to person, place, and time. She appears well-developed and well-nourished. No distress.  HENT:  Head: Normocephalic and atraumatic.  Eyes: Conjunctivae and EOM are normal.  Cardiovascular: Normal rate and regular rhythm.   Pulmonary/Chest: Effort normal. No stridor. No respiratory distress. She has wheezes.  Wheezing on left and right.  Abdominal: Soft. She exhibits no distension.  Musculoskeletal: She exhibits no edema.  Neurological: She is alert and oriented to person, place, and time. No cranial nerve deficit.  Skin: Skin is warm and dry.  Psychiatric: She has a normal mood and affect.  Nursing note and vitals reviewed.   ED Course  Procedures  DIAGNOSTIC STUDIES: Oxygen Saturation is 93% on room air, adequate by my interpretation.    COORDINATION OF CARE: 2:48 PM-Discussed treatment plan which includes CXR and labs with pt at bedside and pt agreed to plan.  Labs Review Labs Reviewed  COMPREHENSIVE METABOLIC PANEL - Abnormal; Notable for the following:    Sodium 132 (*)    Glucose, Bld 437 (*)    Calcium 8.3 (*)    Alkaline Phosphatase 125 (*)    GFR calc non Af Amer 71 (*)    GFR calc Af Amer 83 (*)    All other components within normal limits  CBC WITH DIFFERENTIAL/PLATELET - Abnormal; Notable for the following:    WBC 2.9 (*)    RBC 3.58 (*)    Hemoglobin 11.9 (*)    HCT 35.9 (*)    MCV 100.3 (*)    All other components within normal limits  URINALYSIS, ROUTINE W REFLEX MICROSCOPIC - Abnormal; Notable for the following:    Glucose, UA >1000 (*)    All other components within normal limits  URINE MICROSCOPIC-ADD ON - Abnormal; Notable for  the following:    Squamous Epithelial / LPF FEW (*)    All other components within normal limits  CBG MONITORING, ED - Abnormal; Notable for the following:    Glucose-Capillary 453 (*)    All other components within normal limits  CBG MONITORING, ED - Abnormal; Notable for the following:    Glucose-Capillary 257 (*)    All other components within normal limits  LIPASE, BLOOD  I-STAT CG4 LACTIC ACID, ED    Imaging Review Dg Chest 2 View  02/12/2015   CLINICAL DATA:  Difficulty breathing.  EXAM: CHEST  2 VIEW  COMPARISON:  None.  FINDINGS: Note that the posterior aspect of the chest is not visualized on the lateral view due to difficulties in patient positioning.  There is slight scarring in the left base. There is no edema or consolidation. The heart size and pulmonary vascularity are within normal limits. No adenopathy. There is postoperative change in the proximal right humerus. 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 There is postoperative change in the lower cervical spine.  IMPRESSION: Scarring left base.  No edema or consolidation.   Electronically Signed   By: Lowella Grip III M.D.   On: 02/12/2015 15:35    6:37 PM Patient resting comfortably.  Repeat glucose is substantially down.   MDM   Final diagnoses:  Hyperglycemia  Pain   I personally performed the services described in this documentation, which was scribed in my presence. The recorded information has been reviewed and is accurate.   Patient presents with concern of diffuse pain, persistent hyperglycemia.  Here the patient has no evidence for neurologic dysfunction, vascular compromise, nor Occasions of diabetes other than hyperglycemia, which improves after provision of insulin here. Patient remained calm, in no distress during her emergency department course. Per request, she was provided a short course of analgesia to last until she sees her physician in a few days. Patient was appropriate for  discharge with further evaluation, management to occur as an outpatient.   Carmin Muskrat, MD 02/12/15 Bosie Helper

## 2015-02-13 ENCOUNTER — Telehealth: Payer: Self-pay | Admitting: Medical Oncology

## 2015-02-13 NOTE — Telephone Encounter (Signed)
err

## 2015-02-13 NOTE — Telephone Encounter (Signed)
I returned [name]'s call. I gave her appt for 2/22 and she said "I'll be there"

## 2015-02-14 ENCOUNTER — Encounter (HOSPITAL_COMMUNITY): Payer: Self-pay | Admitting: Psychiatry

## 2015-02-17 ENCOUNTER — Encounter (HOSPITAL_COMMUNITY): Payer: Self-pay | Admitting: Emergency Medicine

## 2015-02-17 ENCOUNTER — Emergency Department (HOSPITAL_COMMUNITY): Payer: Medicare Other

## 2015-02-17 ENCOUNTER — Emergency Department (HOSPITAL_COMMUNITY)
Admission: EM | Admit: 2015-02-17 | Discharge: 2015-02-17 | Disposition: A | Discharge status: Home / Self Care | Payer: Medicare Other | Attending: Emergency Medicine | Admitting: Emergency Medicine

## 2015-02-17 DIAGNOSIS — F419 Anxiety disorder, unspecified: Secondary | ICD-10-CM | POA: Insufficient documentation

## 2015-02-17 DIAGNOSIS — J441 Chronic obstructive pulmonary disease with (acute) exacerbation: Secondary | ICD-10-CM | POA: Insufficient documentation

## 2015-02-17 DIAGNOSIS — R05 Cough: Secondary | ICD-10-CM

## 2015-02-17 DIAGNOSIS — Z79899 Other long term (current) drug therapy: Secondary | ICD-10-CM | POA: Insufficient documentation

## 2015-02-17 DIAGNOSIS — Z8673 Personal history of transient ischemic attack (TIA), and cerebral infarction without residual deficits: Secondary | ICD-10-CM | POA: Insufficient documentation

## 2015-02-17 DIAGNOSIS — M1990 Unspecified osteoarthritis, unspecified site: Secondary | ICD-10-CM | POA: Insufficient documentation

## 2015-02-17 DIAGNOSIS — R059 Cough, unspecified: Secondary | ICD-10-CM

## 2015-02-17 DIAGNOSIS — E1165 Type 2 diabetes mellitus with hyperglycemia: Secondary | ICD-10-CM | POA: Diagnosis present

## 2015-02-17 DIAGNOSIS — Z794 Long term (current) use of insulin: Secondary | ICD-10-CM | POA: Insufficient documentation

## 2015-02-17 DIAGNOSIS — Z923 Personal history of irradiation: Secondary | ICD-10-CM | POA: Insufficient documentation

## 2015-02-17 DIAGNOSIS — Z7951 Long term (current) use of inhaled steroids: Secondary | ICD-10-CM | POA: Diagnosis not present

## 2015-02-17 DIAGNOSIS — Z8742 Personal history of other diseases of the female genital tract: Secondary | ICD-10-CM | POA: Diagnosis not present

## 2015-02-17 DIAGNOSIS — Z7982 Long term (current) use of aspirin: Secondary | ICD-10-CM | POA: Insufficient documentation

## 2015-02-17 DIAGNOSIS — I10 Essential (primary) hypertension: Secondary | ICD-10-CM | POA: Insufficient documentation

## 2015-02-17 DIAGNOSIS — J449 Chronic obstructive pulmonary disease, unspecified: Secondary | ICD-10-CM

## 2015-02-17 DIAGNOSIS — F4310 Post-traumatic stress disorder, unspecified: Secondary | ICD-10-CM | POA: Insufficient documentation

## 2015-02-17 DIAGNOSIS — Z85118 Personal history of other malignant neoplasm of bronchus and lung: Secondary | ICD-10-CM | POA: Insufficient documentation

## 2015-02-17 DIAGNOSIS — F329 Major depressive disorder, single episode, unspecified: Secondary | ICD-10-CM | POA: Diagnosis not present

## 2015-02-17 DIAGNOSIS — R739 Hyperglycemia, unspecified: Secondary | ICD-10-CM

## 2015-02-17 LAB — COMPREHENSIVE METABOLIC PANEL
ALT: 8 U/L (ref 0–35)
AST: 16 U/L (ref 0–37)
Albumin: 3.6 g/dL (ref 3.5–5.2)
Alkaline Phosphatase: 120 U/L — ABNORMAL HIGH (ref 39–117)
Anion gap: 7 (ref 5–15)
BILIRUBIN TOTAL: 0.7 mg/dL (ref 0.3–1.2)
BUN: 10 mg/dL (ref 6–23)
CALCIUM: 8.2 mg/dL — AB (ref 8.4–10.5)
CO2: 28 mmol/L (ref 19–32)
Chloride: 92 mmol/L — ABNORMAL LOW (ref 96–112)
Creatinine, Ser: 0.94 mg/dL (ref 0.50–1.10)
GFR calc Af Amer: 71 mL/min — ABNORMAL LOW (ref >90)
GFR calc non Af Amer: 61 mL/min — ABNORMAL LOW (ref >90)
GLUCOSE: 589 mg/dL — AB (ref 70–99)
Potassium: 3.7 mmol/L (ref 3.5–5.1)
Sodium: 127 mmol/L — ABNORMAL LOW (ref 135–145)
Total Protein: 6.4 g/dL (ref 6.0–8.3)

## 2015-02-17 LAB — CBC WITH DIFFERENTIAL/PLATELET
BASOS PCT: 0 % (ref 0–1)
Basophils Absolute: 0 10*3/uL (ref 0.0–0.1)
EOS PCT: 1 % (ref 0–5)
Eosinophils Absolute: 0 10*3/uL (ref 0.0–0.7)
HCT: 35 % — ABNORMAL LOW (ref 36.0–46.0)
Hemoglobin: 11.5 g/dL — ABNORMAL LOW (ref 12.0–15.0)
LYMPHS ABS: 0.9 10*3/uL (ref 0.7–4.0)
Lymphocytes Relative: 19 % (ref 12–46)
MCH: 33.1 pg (ref 26.0–34.0)
MCHC: 32.9 g/dL (ref 30.0–36.0)
MCV: 100.9 fL — AB (ref 78.0–100.0)
Monocytes Absolute: 0.3 10*3/uL (ref 0.1–1.0)
Monocytes Relative: 7 % (ref 3–12)
NEUTROS PCT: 73 % (ref 43–77)
Neutro Abs: 3.3 10*3/uL (ref 1.7–7.7)
Platelets: 114 10*3/uL — ABNORMAL LOW (ref 150–400)
RBC: 3.47 MIL/uL — AB (ref 3.87–5.11)
RDW: 13.3 % (ref 11.5–15.5)
WBC: 4.5 10*3/uL (ref 4.0–10.5)

## 2015-02-17 LAB — I-STAT CG4 LACTIC ACID, ED: Lactic Acid, Venous: 1.01 mmol/L (ref 0.5–2.0)

## 2015-02-17 LAB — URINALYSIS, ROUTINE W REFLEX MICROSCOPIC
Bilirubin Urine: NEGATIVE
Glucose, UA: >1000 mg/dL — AB
Hgb urine dipstick: NEGATIVE
Ketones, ur: NEGATIVE mg/dL
Leukocytes, UA: NEGATIVE
Nitrite: NEGATIVE
PROTEIN: NEGATIVE mg/dL
Specific Gravity, Urine: 1.01 (ref 1.005–1.030)
Urobilinogen, UA: 0.2 mg/dL (ref 0.0–1.0)
pH: 6.5 (ref 5.0–8.0)

## 2015-02-17 LAB — URINE MICROSCOPIC-ADD ON

## 2015-02-17 LAB — TROPONIN I: Troponin I: <0.03 ng/mL (ref <0.031)

## 2015-02-17 LAB — CBG MONITORING, ED
GLUCOSE-CAPILLARY: 306 mg/dL — AB (ref 70–99)
Glucose-Capillary: 594 mg/dL (ref 70–99)
Glucose-Capillary: 487 mg/dL — ABNORMAL HIGH (ref 70–99)

## 2015-02-17 LAB — LIPASE, BLOOD: Lipase: 15 U/L (ref 11–59)

## 2015-02-17 MED ORDER — INSULIN ASPART 100 UNIT/ML ~~LOC~~ SOLN
10.0000 [IU] | Freq: Once | SUBCUTANEOUS | Status: AC
  Administered 2015-02-17: 10 [IU] via INTRAVENOUS
  Filled 2015-02-17: qty 1

## 2015-02-17 MED ORDER — SODIUM CHLORIDE 0.9 % IV SOLN
Freq: Once | INTRAVENOUS | Status: AC
  Administered 2015-02-17: 16:00:00 via INTRAVENOUS

## 2015-02-17 MED ORDER — INSULIN ASPART 100 UNIT/ML ~~LOC~~ SOLN
10.0000 [IU] | Freq: Once | SUBCUTANEOUS | Status: AC
  Administered 2015-02-17: 10 [IU] via SUBCUTANEOUS
  Filled 2015-02-17: qty 1

## 2015-02-17 MED ORDER — AZITHROMYCIN 250 MG PO TABS: 250.0000 mg | ORAL_TABLET | Freq: Every day | ORAL | Status: DC

## 2015-02-17 MED ORDER — IPRATROPIUM-ALBUTEROL 0.5-2.5 (3) MG/3ML IN SOLN
3.0000 mL | Freq: Once | RESPIRATORY_TRACT | Status: AC
  Administered 2015-02-17: 3 mL via RESPIRATORY_TRACT
  Filled 2015-02-17: qty 3

## 2015-02-17 NOTE — ED Notes (Signed)
Pt blood sugar > 600. Pt seen in ED over weekend for same.

## 2015-02-17 NOTE — ED Notes (Signed)
respiratory called for neb treatment

## 2015-02-17 NOTE — Discharge Instructions (Signed)
Chronic Obstructive Pulmonary Disease Chronic obstructive pulmonary disease (COPD) is a common lung problem. In COPD, the flow of air from the lungs is limited. The way your lungs work will probably never return to normal, but there are things you can do to improve your lungs and make yourself feel better. HOME CARE  Take all medicines as told by your doctor.  Avoid medicines or cough syrups that dry up your airway (such as antihistamines) and do not allow you to get rid of thick spit. You do not need to avoid them if told differently by your doctor.  If you smoke, stop. Smoking makes the problem worse.  Avoid being around things that make your breathing worse (like smoke, chemicals, and fumes).  Use oxygen therapy and therapy to help improve your lungs (pulmonary rehabilitation) if told by your doctor. If you need home oxygen therapy, ask your doctor if you should buy a tool to measure your oxygen level (oximeter).  Avoid people who have a sickness you can catch (contagious).  Avoid going outside when it is very hot, cold, or humid.  Eat healthy foods. Eat smaller meals more often. Rest before meals.  Stay active, but remember to also rest.  Make sure to get all the shots (vaccines) your doctor recommends. Ask your doctor if you need a pneumonia shot.  Learn and use tips on how to relax.  Learn and use tips on how to control your breathing as told by your doctor. Try:  Breathing in (inhaling) through your nose for 1 second. Then, pucker your lips and breath out (exhale) through your lips for 2 seconds.  Putting one hand on your belly (abdomen). Breathe in slowly through your nose for 1 second. Your hand on your belly should move out. Pucker your lips and breathe out slowly through your lips. Your hand on your belly should move in as you breathe out.  Learn and use controlled coughing to clear thick spit from your lungs. The steps are:  Lean your head a little forward.  Breathe in  deeply.  Try to hold your breath for 3 seconds.  Keep your mouth slightly open while coughing 2 times.  Spit any thick spit out into a tissue.  Rest and do the steps again 1 or 2 times as needed. GET HELP IF:  You cough up more thick spit than usual.  There is a change in the color or thickness of the spit.  It is harder to breathe than usual.  Your breathing is faster than usual. GET HELP RIGHT AWAY IF:   You have shortness of breath while resting.  You have shortness of breath that stops you from:  Being able to talk.  Doing normal activities.  You chest hurts for longer than 5 minutes.  Your skin color is more blue than usual.  Your pulse oximeter shows that you have low oxygen for longer than 5 minutes. MAKE SURE YOU:   Understand these instructions.  Will watch your condition.  Will get help right away if you are not doing well or get worse. Document Released: 06/03/2008 Document Revised: 05/02/2014 Document Reviewed: 08/12/2013 [facility] Patient Information 2015 [place], [place]. This information is not intended to replace advice given to you by your health care provider. Make sure you discuss any questions you have with your health care provider.  High Blood Sugar High blood sugar (hyperglycemia) means that the level of sugar in your blood is higher than it should be. Signs of high blood  sugar include:  Feeling thirsty.  Frequent peeing (urinating).  Feeling tired or sleepy.  Dry mouth.  Vision changes.  Feeling weak.  Feeling hungry but losing weight.  Numbness and tingling in your hands or feet.  Headache. When you ignore these signs, your blood sugar may keep going up. These problems may get worse, and other problems may begin. HOME CARE  Check your blood sugars as told by your doctor. Write down the numbers with the date and time.  Take the right amount of insulin or diabetes pills at the right time. Write down the dose with date and  time.  Refill your insulin or diabetes pills before running out.  Watch what you eat. Follow your meal plan.  Drink liquids without sugar, such as water. Check with your doctor if you have kidney or heart disease.  Follow your doctor's orders for exercise. Exercise at the same time of day.  Keep your doctor's appointments. GET HELP RIGHT AWAY IF:   You have trouble thinking or are confused.  You have fast breathing with fruity smelling breath.  You pass out (faint).  You have 2 to 3 days of high blood sugars and you do not know why.  You have chest pain.  You are feeling sick to your stomach (nauseous) or throwing up (vomiting).  You have sudden vision changes. MAKE SURE YOU:   Understand these instructions.  Will watch your condition.  Will get help right away if you are not doing well or get worse. Document Released: 10/13/2009 Document Revised: 03/09/2012 Document Reviewed: 10/13/2009 [facility] Patient Information 2015 [place], [place]. This information is not intended to replace advice given to you by your health care provider. Make sure you discuss any questions you have with your health care provider.

## 2015-02-17 NOTE — ED Provider Notes (Signed)
CSN: [id number]     Arrival date & time 02/17/15  1514 History   First MD Initiated Contact with Patient 02/17/15 1525     Chief Complaint  Patient presents with  . Hyperglycemia     (Consider location/radiation/quality/duration/timing/severity/associated sxs/prior Treatment) HPI Comments: Patient presents to the ER for evaluation of elevated blood sugar. Patient reports that her blood sugar has been running high for several days. She reports being seen in the ER for 5 days ago for same. Her sugars have been running high at home since. She was greater than 600 today.  Patient is unsure why her sugars are running high. She reports that she has been taking her medications as prescribed. She has not had any recent illness, denies vomiting, diarrhea, fever. There is no chest pain. Patient does have a history of lung cancer and COPD. She is not currently on prednisone. She does have cough and shortness of breath associated with her COPD.  Patient is a 69 y.o. female presenting with hyperglycemia.  Hyperglycemia Associated symptoms: shortness of breath     Past Medical History  Diagnosis Date  . H/O: stroke 2012, 2000,     X3  . Anxiety   . PTSD (post-traumatic stress disorder)   . Depression   . SVD (spontaneous vaginal delivery)     x 1  . Smoker   . Osteoarthritis     knees, hips hands,   . Shortness of breath   . Pain     SEVERE PAIN BACK, RIGHT HIP AND KNEES--HARRINGTON RODS AND CERVICAL PLATES--USES WALKER OR CANE WHEN AMBULATING - GOES TO A PAIN CLINIC-STATES SHE NEEDS RT HIP AND BOTH KNEES REPLACED. PT STATES SHE WAS TOLD THE CEMENT AROUND THE HARRINGTON RODS IS CRACKED.  [name] Hot flashes, menopausal     SEVERE  . Complication of anesthesia 03/02/13    severe headache,vertigo postop  . COPD (chronic obstructive pulmonary disease)     stable  . 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 Lung cancer   . Status post radiation therapy within four to twelve weeks 10/18/13-12/06/13    lung  ca  66Gy  . Family history of anesthesia complication     equipment failure caused problem, suffered a stroke & heartattack  . Diabetes mellitus, type II   . Diabetes mellitus without complication   . Cancer   . Hypertension   . COPD (chronic obstructive pulmonary disease)    Past Surgical History  Procedure Laterality Date  . Carpal tunnel release      BILATERAL  . Nasal sinus surgery    . Lumbar fusion      L4-S1  . Cervical fusion    . Hernia repair    . Salpingoophorectomy Right   . Vaginal hysterectomy      TAH w/ ovary removal  . Transthoracic echocardiogram  12-03-2011    LVSF NORMAL/ EF 60-65%  . Vulvectomy N/A 03/02/2013    Procedure: WIDE LOCAL EXCISION VULVAR;  Surgeon: Imagene Gurney A. Alycia Rossetti, MD;  Location: [facility];  Service: Gynecology;  Laterality: N/A;  . Co2 laser application N/A 6/38/4665    Procedure: LASER APPLICATION OF THE VULVA;  Surgeon: Janie Morning, MD;  Location: [facility];  Service: Gynecology;  Laterality: N/A;  . Video bronchoscopy Bilateral 09/23/2013    Procedure: VIDEO BRONCHOSCOPY WITHOUT FLUORO;  Surgeon: Tanda Rockers, MD;  Location: [facility];  Service: Cardiopulmonary;  Laterality: Bilateral;  . Hemiarthroplasty shoulder fracture Left 05/25/2014  DR Tamera Punt  . Reverse shoulder arthroplasty Left 05/24/2014    Procedure:   Hemi-Arthroplasty  Left Shoulder;  Surgeon: Nita Sells, MD;  Location: [facility];  Service: Orthopedics;  Laterality: Left;  . Orif humerus fracture Right 12/12/2014    Procedure: OPEN REDUCTION INTERNAL FIXATION (ORIF) PROXIMAL HUMERUS FRACTURE;  Surgeon: Nita Sells, MD;  Location: [facility];  Service: Orthopedics;  Laterality: Right;   Family History  Problem Relation Age of Onset  . Cancer Mother     COLON  . Depression Mother   . Heart disease Father   . Diabetes Maternal Aunt   . Cancer Maternal Grandfather     KIDNEY   . Diabetes Maternal Grandfather   . Hypertension Maternal  Grandfather   . Heart disease Maternal Grandfather   . Drug abuse Sister    History  Substance Use Topics  . Smoking status: Never Smoker   . Smokeless tobacco: Not on file  . Alcohol Use: No   OB History    Gravida Para Term Preterm AB TAB SAB Ectopic Multiple Living   2 1 0 0 1 0 0 0       Review of Systems  Respiratory: Positive for cough and shortness of breath.   All other systems reviewed and are negative.     Allergies  Carafate; Prednisone; Nsaids; Aspirin; Aspirin; Ibuprofen; Nsaids; and Dulera  Home Medications   Prior to Admission medications   Medication Sig Start Date End Date Taking? Authorizing Provider  acetaminophen (TYLENOL) 325 MG tablet Take 650 mg by mouth every 6 (six) hours as needed for mild pain or moderate pain (TAKES FOR ARM PAIN).    Yes Historical Provider, MD  albuterol (PROVENTIL) (2.5 MG/3ML) 0.083% nebulizer solution Take 2.5 mg by nebulization as needed for wheezing.   Yes Historical Provider, MD  albuterol (VENTOLIN HFA) 108 (90 BASE) MCG/ACT inhaler Inhale 2 puffs into the lungs every 6 (six) hours as needed for wheezing or shortness of breath. 11/10/14  Yes Curt Bears, MD  alprazolam Duanne Moron) 2 MG tablet Take 1 tablet (2 mg total) by mouth 2 (two) times daily. 01/23/15  Yes Levonne Spiller, MD  atenolol (TENORMIN) 50 MG tablet Take 1 tablet (50 mg total) by mouth daily. 11/25/13  Yes Velvet Bathe, MD  benzonatate (TESSALON) 100 MG capsule Take 100 mg by mouth 2 (two) times daily.   Yes Historical Provider, MD  chlorzoxazone (PARAFON) 500 MG tablet Take 500 mg by mouth 4 (four) times daily.   Yes Historical Provider, MD  dextromethorphan-guaiFENesin (MUCINEX DM) 30-600 MG per 12 hr tablet Take 2 tablets by mouth 2 (two) times daily.   Yes Historical Provider, MD  diphenoxylate-atropine (LOMOTIL) 2.5-0.025 MG per tablet Take 1 tablet by mouth 2 (two) times daily as needed for diarrhea or loose stools.   Yes Historical Provider, MD  docusate  sodium 100 MG CAPS Take 100 mg by mouth 2 (two) times daily. 12/14/14  Yes Debbe Odea, MD  esomeprazole (NEXIUM) 40 MG capsule Take 40 mg by mouth daily.    Yes Historical Provider, MD  fluticasone (FLONASE) 50 MCG/ACT nasal spray Place 1 spray into both nostrils daily as needed for allergies or rhinitis.   Yes Historical Provider, MD  insulin aspart (NOVOLOG FLEXPEN) 100 UNIT/ML FlexPen Inject 3 Units into the skin 3 (three) times daily with meals. 01/11/15  Yes Debbe Odea, MD  Insulin Glargine (LANTUS SOLOSTAR) 100 UNIT/ML Solostar Pen Inject 20 Units into the skin daily at 10  pm. 01/11/15  Yes Debbe Odea, MD  levothyroxine (SYNTHROID) 50 MCG tablet Take 1 tablet (50 mcg total) by mouth daily before breakfast. 11/23/14  Yes Curt Bears, MD  lidocaine (XYLOCAINE) 5 % ointment Apply 1 application topically as needed for mild pain or moderate pain.   Yes Historical Provider, MD  lisinopril (PRINIVIL,ZESTRIL) 5 MG tablet Take 1 tablet (5 mg total) by mouth daily. 01/11/15  Yes Debbe Odea, MD  loperamide (IMODIUM) 2 MG capsule Take 2 mg by mouth as needed for diarrhea or loose stools.   Yes Historical Provider, MD  oxyCODONE-acetaminophen (PERCOCET) 10-325 MG per tablet Take 1 tablet by mouth 3 (three) times daily as needed for pain.   Yes Historical Provider, MD  sertraline (ZOLOFT) 100 MG tablet Take 2 tablets (200 mg total) by mouth daily. 01/23/15  Yes Levonne Spiller, MD  tiotropium (SPIRIVA) 18 MCG inhalation capsule Place 1 capsule (18 mcg total) into inhaler and inhale daily. 12/14/14  Yes Debbe Odea, MD  traZODone (DESYREL) 150 MG tablet Take 300 mg by mouth at bedtime.   Yes Historical Provider, MD  Aclidinium Bromide (TUDORZA PRESSAIR) 400 MCG/ACT AEPB Inhale 1 puff into the lungs 2 (two) times daily. Patient not taking: Reported on 02/17/2015 11/10/14   Curt Bears, MD  aspirin EC 325 MG EC tablet Take 1 tablet (325 mg total) by mouth 2 (two) times daily. Patient not taking:  Reported on 02/17/2015 12/14/14   Debbe Odea, MD  fluconazole (DIFLUCAN) 100 MG tablet Take 1 tablet (100 mg total) by mouth daily. Patient not taking: Reported on 02/17/2015 01/11/15   Debbe Odea, MD  glucose monitoring kit (FREESTYLE) monitoring kit 1 each by Does not apply route 4 (four) times daily - after meals and at bedtime. 1 month Diabetic Testing Supplies for QAC-QHS accuchecks. 01/11/15   Debbe Odea, MD  Insulin Pen Needle (NOVOFINE) 30G X 8 MM MISC Inject 10 each into the skin 4 (four) times daily. 01/11/15   Debbe Odea, MD  nystatin ointment (MYCOSTATIN) Apply topically 3 (three) times daily. 01/11/15   Debbe Odea, MD  ondansetron (ZOFRAN ODT) 4 MG disintegrating tablet Take 1 tablet (4 mg total) by mouth every 8 (eight) hours as needed for nausea or vomiting. Patient not taking: Reported on 02/17/2015 04/14/14   Curt Bears, MD  oxyCODONE-acetaminophen (ROXICET) 5-325 MG per tablet Take 1-2 tablets by mouth every 4 (four) hours as needed for severe pain. Patient not taking: Reported on 02/17/2015 12/13/14   Grier Mitts, PA-C   BP 162/62 mmHg  Pulse 66  Temp(Src) 98.4 F (36.9 C)  Resp 16  Ht 5' 6"  (1.676 m)  Wt 154 lb (69.854 kg)  BMI 24.87 kg/m2  SpO2 95% Physical Exam  Constitutional: She is oriented to person, place, and time. She appears well-developed and well-nourished. No distress.  HENT:  Head: Normocephalic and atraumatic.  Right Ear: Hearing normal.  Left Ear: Hearing normal.  Nose: Nose normal.  Mouth/Throat: Oropharynx is clear and moist and mucous membranes are normal.  Eyes: Conjunctivae and EOM are normal. Pupils are equal, round, and reactive to light.  Neck: Normal range of motion. Neck supple.  Cardiovascular: Regular rhythm, S1 normal and S2 normal.  Exam reveals no gallop and no friction rub.   No murmur heard. Pulmonary/Chest: Effort normal. No respiratory distress. She has decreased breath sounds. She has wheezes. She exhibits no  tenderness.  Abdominal: Soft. Normal appearance and bowel sounds are normal. There is no hepatosplenomegaly. There is no tenderness.  There is no rebound, no guarding, no tenderness at McBurney's point and negative Murphy's sign. No hernia.  Musculoskeletal: Normal range of motion.  Neurological: She is alert and oriented to person, place, and time. She has normal strength. No cranial nerve deficit or sensory deficit. Coordination normal. GCS eye subscore is 4. GCS verbal subscore is 5. GCS motor subscore is 6.  Skin: Skin is warm, dry and intact. No rash noted. No cyanosis.  Psychiatric: She has a normal mood and affect. Her speech is normal and behavior is normal. Thought content normal.  Nursing note and vitals reviewed.   ED Course  Procedures (including critical care time) Labs Review Labs Reviewed  CBC WITH DIFFERENTIAL/PLATELET - Abnormal; Notable for the following:    RBC 3.47 (*)    Hemoglobin 11.5 (*)    HCT 35.0 (*)    MCV 100.9 (*)    Platelets 114 (*)    All other components within normal limits  COMPREHENSIVE METABOLIC PANEL - Abnormal; Notable for the following:    Sodium 127 (*)    Chloride 92 (*)    Glucose, Bld 589 (*)    Calcium 8.2 (*)    Alkaline Phosphatase 120 (*)    GFR calc non Af Amer 61 (*)    GFR calc Af Amer 71 (*)    All other components within normal limits  URINALYSIS, ROUTINE W REFLEX MICROSCOPIC - Abnormal; Notable for the following:    Glucose, UA >1000 (*)    All other components within normal limits  URINE MICROSCOPIC-ADD ON - Abnormal; Notable for the following:    Squamous Epithelial / LPF FEW (*)    All other components within normal limits  CBG MONITORING, ED - Abnormal; Notable for the following:    Glucose-Capillary 594 (*)    All other components within normal limits  CBG MONITORING, ED - Abnormal; Notable for the following:    Glucose-Capillary 487 (*)    All other components within normal limits  CBG MONITORING, ED - Abnormal;  Notable for the following:    Glucose-Capillary 306 (*)    All other components within normal limits  LIPASE, BLOOD  TROPONIN I  BETA-HYDROXYBUTYRIC ACID  I-STAT CG4 LACTIC ACID, ED  CBG MONITORING, ED    Imaging Review Dg Chest 2 View  02/17/2015   CLINICAL DATA:  Cough for several days.  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 The previously noted right infrahilar nodular opacity is not well seen on this radiographic examination. There is patchy bibasilar atelectasis. Lungs elsewhere clear. Heart size and pulmonary vascularity are normal. No adenopathy. There is thoracolumbar dextroscoliosis. Postoperative change is noted in the proximal right humerus with screw and buttress plate fixation. There is bony remodeling in the proximal humeral region, stable. 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 There is postoperative change in the lower cervical spine.  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 Patchy bibasilar atelectasis. Infrahilar lung nodular lesion noted previously is not well seen on this study.  No change in cardiac silhouette.  No adenopathy appreciable.   Electronically Signed   By: Lowella Grip III M.D.   On: 02/17/2015 16:13     EKG Interpretation   Date/Time:  Friday February 17 2015 15:26:35 EST Ventricular Rate:  95  PR Interval:  176 QRS Duration: 89 QT Interval:  425 QTC Calculation: 452 R Axis:   72 Text Interpretation:  Sinus rhythm Normal ECG Confirmed by POLLINA  MD,  CHRISTOPHER ([id number]) on 02/17/2015 3:34:53 PM      MDM   Final diagnoses:  Cough  Hyperglycemia    Patient presents to the ER for evaluation of elevated blood sugar. Patient's blood sugars have been running high this week. She was seen once in the ER already for this problem.  Patient's blood sugar was 589 today. No evidence of ketosis. Patient was administered IV fluids and insulin, glucose is improving. Remainder of the workup is unremarkable. Cardiac evaluation negative, all labs are unremarkable other than glucose. No evidence of urinary tract infection. She does have a cough and has wheezing upon arrival here in the ER. This improved with nebulizer treatment. Patient does report a previous history of COPD and lung cancer.  Patient is reportedly entering assisted living this week. She will be discharged, continue her insulin as prescribed and watch her sugars closely. Return if symptoms worsen.    Orpah Greek, MD 02/17/15 [phone]

## 2015-02-20 ENCOUNTER — Other Ambulatory Visit: Payer: Self-pay

## 2015-02-20 ENCOUNTER — Ambulatory Visit: Payer: Self-pay | Admitting: Oncology

## 2015-02-20 ENCOUNTER — Telehealth: Payer: Self-pay | Admitting: *Deleted

## 2015-02-20 NOTE — Telephone Encounter (Signed)
Patient's friend Pam called and canceled appts for today. They will call back to reschedule.

## 2015-02-22 LAB — BETA-HYDROXYBUTYRIC ACID: Beta-Hydroxybutyric Acid: 0.24 mmol/L

## 2015-02-26 ENCOUNTER — Emergency Department (HOSPITAL_COMMUNITY): Payer: Medicare Other

## 2015-02-26 ENCOUNTER — Inpatient Hospital Stay (HOSPITAL_COMMUNITY)
Admission: EM | Admit: 2015-02-26 | Discharge: 2015-02-28 | DRG: 093 | Disposition: A | Discharge status: Home Health | Payer: Medicare Other | Attending: Internal Medicine | Admitting: Internal Medicine

## 2015-02-26 ENCOUNTER — Encounter (HOSPITAL_COMMUNITY): Payer: Self-pay | Admitting: *Deleted

## 2015-02-26 DIAGNOSIS — E876 Hypokalemia: Secondary | ICD-10-CM | POA: Diagnosis present

## 2015-02-26 DIAGNOSIS — Z800 Family history of malignant neoplasm of digestive organs: Secondary | ICD-10-CM

## 2015-02-26 DIAGNOSIS — Y92009 Unspecified place in unspecified non-institutional (private) residence as the place of occurrence of the external cause: Secondary | ICD-10-CM | POA: Diagnosis not present

## 2015-02-26 DIAGNOSIS — E11649 Type 2 diabetes mellitus with hypoglycemia without coma: Secondary | ICD-10-CM | POA: Diagnosis present

## 2015-02-26 DIAGNOSIS — M1990 Unspecified osteoarthritis, unspecified site: Secondary | ICD-10-CM | POA: Diagnosis present

## 2015-02-26 DIAGNOSIS — Z9114 Patient's other noncompliance with medication regimen: Secondary | ICD-10-CM | POA: Diagnosis present

## 2015-02-26 DIAGNOSIS — M25579 Pain in unspecified ankle and joints of unspecified foot: Secondary | ICD-10-CM | POA: Diagnosis present

## 2015-02-26 DIAGNOSIS — W19XXXA Unspecified fall, initial encounter: Secondary | ICD-10-CM | POA: Diagnosis present

## 2015-02-26 DIAGNOSIS — G92 Toxic encephalopathy: Principal | ICD-10-CM | POA: Diagnosis present

## 2015-02-26 DIAGNOSIS — Z923 Personal history of irradiation: Secondary | ICD-10-CM

## 2015-02-26 DIAGNOSIS — Z7982 Long term (current) use of aspirin: Secondary | ICD-10-CM

## 2015-02-26 DIAGNOSIS — F13239 Sedative, hypnotic or anxiolytic dependence with withdrawal, unspecified: Secondary | ICD-10-CM

## 2015-02-26 DIAGNOSIS — Z8673 Personal history of transient ischemic attack (TIA), and cerebral infarction without residual deficits: Secondary | ICD-10-CM

## 2015-02-26 DIAGNOSIS — Z79899 Other long term (current) drug therapy: Secondary | ICD-10-CM | POA: Diagnosis not present

## 2015-02-26 DIAGNOSIS — G894 Chronic pain syndrome: Secondary | ICD-10-CM | POA: Diagnosis present

## 2015-02-26 DIAGNOSIS — I10 Essential (primary) hypertension: Secondary | ICD-10-CM | POA: Diagnosis present

## 2015-02-26 DIAGNOSIS — Z85118 Personal history of other malignant neoplasm of bronchus and lung: Secondary | ICD-10-CM | POA: Diagnosis not present

## 2015-02-26 DIAGNOSIS — M25571 Pain in right ankle and joints of right foot: Secondary | ICD-10-CM

## 2015-02-26 DIAGNOSIS — Z8249 Family history of ischemic heart disease and other diseases of the circulatory system: Secondary | ICD-10-CM

## 2015-02-26 DIAGNOSIS — E032 Hypothyroidism due to medicaments and other exogenous substances: Secondary | ICD-10-CM | POA: Diagnosis present

## 2015-02-26 DIAGNOSIS — Z794 Long term (current) use of insulin: Secondary | ICD-10-CM

## 2015-02-26 DIAGNOSIS — Z87891 Personal history of nicotine dependence: Secondary | ICD-10-CM | POA: Diagnosis not present

## 2015-02-26 DIAGNOSIS — Z833 Family history of diabetes mellitus: Secondary | ICD-10-CM

## 2015-02-26 DIAGNOSIS — R55 Syncope and collapse: Secondary | ICD-10-CM

## 2015-02-26 DIAGNOSIS — J449 Chronic obstructive pulmonary disease, unspecified: Secondary | ICD-10-CM | POA: Diagnosis present

## 2015-02-26 DIAGNOSIS — F4310 Post-traumatic stress disorder, unspecified: Secondary | ICD-10-CM | POA: Diagnosis present

## 2015-02-26 DIAGNOSIS — G9340 Encephalopathy, unspecified: Secondary | ICD-10-CM | POA: Diagnosis present

## 2015-02-26 DIAGNOSIS — Z96612 Presence of left artificial shoulder joint: Secondary | ICD-10-CM | POA: Diagnosis present

## 2015-02-26 DIAGNOSIS — F19939 Other psychoactive substance use, unspecified with withdrawal, unspecified: Secondary | ICD-10-CM | POA: Diagnosis present

## 2015-02-26 DIAGNOSIS — G4089 Other seizures: Secondary | ICD-10-CM | POA: Diagnosis present

## 2015-02-26 LAB — CBC WITH DIFFERENTIAL/PLATELET
BASOS ABS: 0 10*3/uL (ref 0.0–0.1)
Basophils Relative: 0 % (ref 0–1)
EOS PCT: 0 % (ref 0–5)
Eosinophils Absolute: 0 10*3/uL (ref 0.0–0.7)
HCT: 37.4 % (ref 36.0–46.0)
Hemoglobin: 12.4 g/dL (ref 12.0–15.0)
LYMPHS ABS: 1.9 10*3/uL (ref 0.7–4.0)
LYMPHS PCT: 16 % (ref 12–46)
MCH: 33.6 pg (ref 26.0–34.0)
MCHC: 33.2 g/dL (ref 30.0–36.0)
MCV: 101.4 fL — AB (ref 78.0–100.0)
MONOS PCT: 7 % (ref 3–12)
Monocytes Absolute: 0.9 10*3/uL (ref 0.1–1.0)
NEUTROS ABS: 9.5 10*3/uL — AB (ref 1.7–7.7)
Neutrophils Relative %: 77 % (ref 43–77)
Platelets: 262 10*3/uL (ref 150–400)
RBC: 3.69 MIL/uL — ABNORMAL LOW (ref 3.87–5.11)
RDW: 13 % (ref 11.5–15.5)
WBC: 12.4 10*3/uL — ABNORMAL HIGH (ref 4.0–10.5)

## 2015-02-26 LAB — COMPREHENSIVE METABOLIC PANEL WITH GFR
ALT: 13 U/L (ref 0–35)
AST: 56 U/L — ABNORMAL HIGH (ref 0–37)
Albumin: 4.4 g/dL (ref 3.5–5.2)
Alkaline Phosphatase: 117 U/L (ref 39–117)
Anion gap: 13 (ref 5–15)
BUN: 6 mg/dL (ref 6–23)
CO2: 25 mmol/L (ref 19–32)
Calcium: 8.4 mg/dL (ref 8.4–10.5)
Chloride: 92 mmol/L — ABNORMAL LOW (ref 96–112)
Creatinine, Ser: 0.8 mg/dL (ref 0.50–1.10)
GFR calc Af Amer: 86 mL/min — ABNORMAL LOW
GFR calc non Af Amer: 75 mL/min — ABNORMAL LOW
Glucose, Bld: 301 mg/dL — ABNORMAL HIGH (ref 70–99)
Potassium: 2.6 mmol/L — CL (ref 3.5–5.1)
Sodium: 130 mmol/L — ABNORMAL LOW (ref 135–145)
Total Bilirubin: 0.5 mg/dL (ref 0.3–1.2)
Total Protein: 8.1 g/dL (ref 6.0–8.3)

## 2015-02-26 LAB — URINE MICROSCOPIC-ADD ON

## 2015-02-26 LAB — ETHANOL: Alcohol, Ethyl (B): <5 mg/dL (ref 0–9)

## 2015-02-26 LAB — URINALYSIS, ROUTINE W REFLEX MICROSCOPIC
Bilirubin Urine: NEGATIVE
Glucose, UA: >1000 mg/dL — AB
Ketones, ur: 15 mg/dL — AB
Leukocytes, UA: NEGATIVE
Nitrite: NEGATIVE
Protein, ur: NEGATIVE mg/dL
Specific Gravity, Urine: 1.015 (ref 1.005–1.030)
Urobilinogen, UA: 0.2 mg/dL (ref 0.0–1.0)
pH: 8 (ref 5.0–8.0)

## 2015-02-26 LAB — MAGNESIUM: Magnesium: 2 mg/dL (ref 1.5–2.5)

## 2015-02-26 LAB — BRAIN NATRIURETIC PEPTIDE: B Natriuretic Peptide: 468 pg/mL — ABNORMAL HIGH (ref 0.0–100.0)

## 2015-02-26 LAB — TSH: TSH: 83.452 u[IU]/mL — ABNORMAL HIGH (ref 0.350–4.500)

## 2015-02-26 LAB — GLUCOSE, CAPILLARY
GLUCOSE-CAPILLARY: 275 mg/dL — AB (ref 70–99)
GLUCOSE-CAPILLARY: 38 mg/dL — AB (ref 70–99)
Glucose-Capillary: 60 mg/dL — ABNORMAL LOW (ref 70–99)

## 2015-02-26 LAB — RAPID URINE DRUG SCREEN, HOSP PERFORMED
AMPHETAMINES: NOT DETECTED
Barbiturates: NOT DETECTED
Benzodiazepines: POSITIVE — AB
Cocaine: NOT DETECTED
Opiates: NOT DETECTED
TETRAHYDROCANNABINOL: NOT DETECTED

## 2015-02-26 LAB — TROPONIN I: Troponin I: <0.03 ng/mL

## 2015-02-26 LAB — CBG MONITORING, ED: Glucose-Capillary: 308 mg/dL — ABNORMAL HIGH (ref 70–99)

## 2015-02-26 MED ORDER — SODIUM CHLORIDE 0.9 % IV SOLN
INTRAVENOUS | Status: DC
  Administered 2015-02-26 – 2015-02-27 (×3): via INTRAVENOUS

## 2015-02-26 MED ORDER — ADULT MULTIVITAMIN W/MINERALS CH
1.0000 | ORAL_TABLET | Freq: Every day | ORAL | Status: DC
  Administered 2015-02-26 – 2015-02-28 (×3): 1 via ORAL
  Filled 2015-02-26 (×3): qty 1

## 2015-02-26 MED ORDER — LORAZEPAM 2 MG/ML IJ SOLN: 1.0000 mg | Freq: Four times a day (QID) | INTRAMUSCULAR | Status: DC | PRN

## 2015-02-26 MED ORDER — INSULIN ASPART 100 UNIT/ML ~~LOC~~ SOLN
0.0000 [IU] | Freq: Every day | SUBCUTANEOUS | Status: DC
  Administered 2015-02-27: 3 [IU] via SUBCUTANEOUS

## 2015-02-26 MED ORDER — INSULIN GLARGINE 100 UNIT/ML ~~LOC~~ SOLN
20.0000 [IU] | Freq: Every day | SUBCUTANEOUS | Status: DC
  Filled 2015-02-26 (×2): qty 0.2

## 2015-02-26 MED ORDER — LORAZEPAM 1 MG PO TABS: 1.0000 mg | ORAL_TABLET | Freq: Four times a day (QID) | ORAL | Status: DC | PRN

## 2015-02-26 MED ORDER — ATENOLOL 25 MG PO TABS
50.0000 mg | ORAL_TABLET | Freq: Every day | ORAL | Status: DC
  Administered 2015-02-26 – 2015-02-28 (×3): 50 mg via ORAL
  Filled 2015-02-26 (×3): qty 2

## 2015-02-26 MED ORDER — THIAMINE HCL 100 MG/ML IJ SOLN: 100.0000 mg | Freq: Every day | INTRAMUSCULAR | Status: DC

## 2015-02-26 MED ORDER — DOCUSATE SODIUM 100 MG PO CAPS
100.0000 mg | ORAL_CAPSULE | Freq: Two times a day (BID) | ORAL | Status: DC
  Administered 2015-02-26 – 2015-02-28 (×5): 100 mg via ORAL
  Filled 2015-02-26 (×4): qty 1

## 2015-02-26 MED ORDER — POTASSIUM CHLORIDE CRYS ER 20 MEQ PO TBCR
40.0000 meq | EXTENDED_RELEASE_TABLET | ORAL | Status: AC
  Administered 2015-02-26 – 2015-02-27 (×4): 40 meq via ORAL
  Filled 2015-02-26 (×4): qty 2

## 2015-02-26 MED ORDER — HEPARIN SODIUM (PORCINE) 5000 UNIT/ML IJ SOLN
5000.0000 [IU] | Freq: Three times a day (TID) | INTRAMUSCULAR | Status: DC
  Administered 2015-02-26 – 2015-02-28 (×6): 5000 [IU] via SUBCUTANEOUS
  Filled 2015-02-26 (×5): qty 1

## 2015-02-26 MED ORDER — POTASSIUM CHLORIDE 10 MEQ/100ML IV SOLN
INTRAVENOUS | Status: AC
  Filled 2015-02-26: qty 100

## 2015-02-26 MED ORDER — TIOTROPIUM BROMIDE MONOHYDRATE 18 MCG IN CAPS
18.0000 ug | ORAL_CAPSULE | Freq: Every day | RESPIRATORY_TRACT | Status: DC
  Filled 2015-02-26: qty 5

## 2015-02-26 MED ORDER — INSULIN ASPART 100 UNIT/ML ~~LOC~~ SOLN
4.0000 [IU] | Freq: Three times a day (TID) | SUBCUTANEOUS | Status: DC
  Administered 2015-02-26 – 2015-02-27 (×2): 4 [IU] via SUBCUTANEOUS

## 2015-02-26 MED ORDER — TRAZODONE HCL 50 MG PO TABS
300.0000 mg | ORAL_TABLET | Freq: Every day | ORAL | Status: DC
  Administered 2015-02-27: 300 mg via ORAL
  Filled 2015-02-26 (×2): qty 6

## 2015-02-26 MED ORDER — POTASSIUM CHLORIDE 10 MEQ/100ML IV SOLN
10.0000 meq | Freq: Once | INTRAVENOUS | Status: AC
  Administered 2015-02-26: 10 meq via INTRAVENOUS
  Filled 2015-02-26: qty 100

## 2015-02-26 MED ORDER — LISINOPRIL 5 MG PO TABS
5.0000 mg | ORAL_TABLET | Freq: Every day | ORAL | Status: DC
  Administered 2015-02-26 – 2015-02-28 (×3): 5 mg via ORAL
  Filled 2015-02-26 (×3): qty 1

## 2015-02-26 MED ORDER — LEVOTHYROXINE SODIUM 50 MCG PO TABS
50.0000 ug | ORAL_TABLET | Freq: Every day | ORAL | Status: DC
  Administered 2015-02-27 – 2015-02-28 (×2): 50 ug via ORAL
  Filled 2015-02-26 (×2): qty 1

## 2015-02-26 MED ORDER — ALPRAZOLAM 1 MG PO TABS
2.0000 mg | ORAL_TABLET | Freq: Two times a day (BID) | ORAL | Status: DC
  Administered 2015-02-26 – 2015-02-28 (×4): 2 mg via ORAL
  Filled 2015-02-26 (×5): qty 2

## 2015-02-26 MED ORDER — ONDANSETRON HCL 4 MG PO TABS: 4.0000 mg | ORAL_TABLET | Freq: Four times a day (QID) | ORAL | Status: DC | PRN

## 2015-02-26 MED ORDER — LORAZEPAM 2 MG/ML IJ SOLN
1.0000 mg | Freq: Once | INTRAMUSCULAR | Status: AC
  Administered 2015-02-26: 1 mg via INTRAVENOUS
  Filled 2015-02-26: qty 1

## 2015-02-26 MED ORDER — FOLIC ACID 1 MG PO TABS
1.0000 mg | ORAL_TABLET | Freq: Every day | ORAL | Status: DC
  Administered 2015-02-26 – 2015-02-28 (×3): 1 mg via ORAL
  Filled 2015-02-26 (×3): qty 1

## 2015-02-26 MED ORDER — CETYLPYRIDINIUM CHLORIDE 0.05 % MT LIQD
7.0000 mL | Freq: Two times a day (BID) | OROMUCOSAL | Status: DC
  Administered 2015-02-26 – 2015-02-28 (×5): 7 mL via OROMUCOSAL

## 2015-02-26 MED ORDER — DEXTROSE 50 % IV SOLN
INTRAVENOUS | Status: AC
  Administered 2015-02-26: 25 mL
  Filled 2015-02-26: qty 50

## 2015-02-26 MED ORDER — LORAZEPAM 2 MG/ML IJ SOLN: 0.0000 mg | Freq: Two times a day (BID) | INTRAMUSCULAR | Status: DC

## 2015-02-26 MED ORDER — LORAZEPAM 2 MG/ML IJ SOLN
0.5000 mg | Freq: Once | INTRAMUSCULAR | Status: AC
  Administered 2015-02-26: 0.5 mg via INTRAVENOUS

## 2015-02-26 MED ORDER — VITAMIN B-1 100 MG PO TABS
100.0000 mg | ORAL_TABLET | Freq: Every day | ORAL | Status: DC
  Administered 2015-02-26 – 2015-02-28 (×3): 100 mg via ORAL
  Filled 2015-02-26 (×3): qty 1

## 2015-02-26 MED ORDER — SERTRALINE HCL 50 MG PO TABS
200.0000 mg | ORAL_TABLET | Freq: Every day | ORAL | Status: DC
  Administered 2015-02-26 – 2015-02-28 (×3): 200 mg via ORAL
  Filled 2015-02-26 (×3): qty 4

## 2015-02-26 MED ORDER — LORAZEPAM 2 MG/ML IJ SOLN
INTRAMUSCULAR | Status: AC
  Filled 2015-02-26: qty 1

## 2015-02-26 MED ORDER — LORAZEPAM 2 MG/ML IJ SOLN
0.0000 mg | Freq: Four times a day (QID) | INTRAMUSCULAR | Status: AC
Start: 2015-02-26 — End: 2015-02-28
  Administered 2015-02-26: 3 mg via INTRAVENOUS
  Administered 2015-02-26: 2 mg via INTRAVENOUS
  Filled 2015-02-26 (×3): qty 1
  Filled 2015-02-26: qty 2

## 2015-02-26 MED ORDER — CHLORZOXAZONE 500 MG PO TABS
500.0000 mg | ORAL_TABLET | Freq: Four times a day (QID) | ORAL | Status: DC
Start: 2015-02-26 — End: 2015-02-28
  Administered 2015-02-26 – 2015-02-28 (×10): 500 mg via ORAL
  Filled 2015-02-26 (×13): qty 1

## 2015-02-26 MED ORDER — INSULIN ASPART 100 UNIT/ML ~~LOC~~ SOLN
0.0000 [IU] | Freq: Three times a day (TID) | SUBCUTANEOUS | Status: DC
  Administered 2015-02-26: 8 [IU] via SUBCUTANEOUS
  Administered 2015-02-27: 3 [IU] via SUBCUTANEOUS

## 2015-02-26 MED ORDER — OXYCODONE HCL 5 MG PO TABS
5.0000 mg | ORAL_TABLET | Freq: Three times a day (TID) | ORAL | Status: DC | PRN
  Administered 2015-02-26 – 2015-02-28 (×6): 5 mg via ORAL
  Filled 2015-02-26 (×6): qty 1

## 2015-02-26 MED ORDER — POTASSIUM CHLORIDE 10 MEQ/100ML IV SOLN
10.0000 meq | Freq: Once | INTRAVENOUS | Status: AC
  Administered 2015-02-26: 10 meq via INTRAVENOUS

## 2015-02-26 MED ORDER — ONDANSETRON HCL 4 MG/2ML IJ SOLN: 4.0000 mg | Freq: Four times a day (QID) | INTRAMUSCULAR | Status: DC | PRN

## 2015-02-26 MED ORDER — OXYCODONE-ACETAMINOPHEN 5-325 MG PO TABS
1.0000 | ORAL_TABLET | Freq: Three times a day (TID) | ORAL | Status: DC | PRN
  Administered 2015-02-27 – 2015-02-28 (×3): 1 via ORAL
  Filled 2015-02-26 (×3): qty 1

## 2015-02-26 MED ORDER — OXYCODONE-ACETAMINOPHEN 10-325 MG PO TABS: 1.0000 | ORAL_TABLET | Freq: Three times a day (TID) | ORAL | Status: DC | PRN

## 2015-02-26 NOTE — ED Notes (Signed)
Pt went to CT assisted by RN and tech. Pt unable to hold still. EDP notified and medication ordered and given. Max dose ordered was given and pt still moving. Pt kept asking for pain medication for her chronic knee pain.

## 2015-02-26 NOTE — ED Notes (Signed)
Pt was found by EMS with patient in the floor. Pt unable to state how she fell or what she hit when she fell. Pt states she is having bilateral arm pain and bilateral knee pain. Pt states she has chronic pain but her pain is worse this morning. Per EMS patient is going to [employer]. NAD noted. Pt is alert and oriented, pt states she is confused. No swelling noted to her head and no tender spots.   CBG 376 per EMS.

## 2015-02-26 NOTE — ED Notes (Signed)
Pt is alert. Blood noted to tongue. Pt talking and responding to simple commands. Pt asking for pain medication.

## 2015-02-26 NOTE — ED Notes (Signed)
Pt asking if she will be given oxcodone while in the hospital. PT given Ativan at this time and is aware. Explained that hospitalist would address inpt pain medications.

## 2015-02-26 NOTE — ED Notes (Addendum)
Nurse was completing assessment on patient, VS were stable, and patient became unresponsive. Extremities were limp. Oxygen saturation decreased to 72%. Carotid pulses were present. NRB applied at 100%. Pt had airway suctioned. . VS are now 187/98, HR 91, O2 100%, pt is alert but confused and resless. Pt is following some commands. MD at bedside, RN obtaining orders.

## 2015-02-26 NOTE — H&P (Signed)
[facility]          History and Physical    PCP:   [name], MD   Chief Complaint:  Fall at home, unresponsive episode in emergency department  HPI: Patient is a 69 year old woman with history of hypertension, type 2 diabetes, chronic pain syndrome, lung cancer status post radiation therapy who presents to the hospital today after a fall at home. Patient is very confused and not able to give me a good history but per ED notes it appears that a friend or neighbor when by her house today and found her on the floor. She was awake and oriented but they brought her to the hospital. While the nurse was assessing her in the emergency department her extremities became limp and her eyes rolled back and she lost consciousness for about 15 minutes. She was given Ativan as this was perceived to probably have been a seizure. Lab work was notable for a potassium of 2.6, white count of 12.4, chest x-ray without acute abnormalities, CT scan of the head that was limited due to motion but no acute abnormalities identified. We have been asked to admit her for further evaluation and management.  Allergies:   Allergies  Allergen Reactions  . Carafate [Sucralfate] Nausea Only  . Prednisone Other (See Comments)    insomnia  . Nsaids     GI Upset  . Aspirin Other (See Comments)    Gi symptoms. Does NOT take ibuprofen or other NSAIDS  . Aspirin Nausea And Vomiting  . Ibuprofen     Gi upset  . Nsaids Nausea And Vomiting  . Dulera [Mometasone Furo-Formoterol Fum] Palpitations    Panting " my body was twisted inside and out"      Past Medical History  Diagnosis Date  . H/O: stroke 2012, 2000,     X3  . Anxiety   . PTSD (post-traumatic stress disorder)   . Depression   . SVD (spontaneous vaginal delivery)     x 1  . Smoker   . Osteoarthritis     knees, hips hands,   . Shortness of breath   . Pain     SEVERE PAIN BACK, RIGHT HIP AND KNEES--HARRINGTON RODS AND CERVICAL  PLATES--USES WALKER OR CANE WHEN AMBULATING - GOES TO A PAIN CLINIC-STATES SHE NEEDS RT HIP AND BOTH KNEES REPLACED. PT STATES SHE WAS TOLD THE CEMENT AROUND THE HARRINGTON RODS IS CRACKED.  [name] Hot flashes, menopausal     SEVERE  . Complication of anesthesia 03/02/13    severe headache,vertigo postop  . COPD (chronic obstructive pulmonary disease)     stable  . 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 Lung cancer   . Status post radiation therapy within four to twelve weeks 10/18/13-12/06/13    lung ca  66Gy  . Family history of anesthesia complication     equipment failure caused problem, suffered a stroke & heartattack  . Diabetes mellitus, type II   . Diabetes mellitus without complication   . Cancer   . Hypertension   . COPD (chronic obstructive pulmonary disease)     Past Surgical History  Procedure Laterality Date  . Carpal tunnel release      BILATERAL  . Nasal sinus surgery    . Lumbar fusion      L4-S1  . Cervical fusion    . Hernia repair    . Salpingoophorectomy Right   .  Vaginal hysterectomy      TAH w/ ovary removal  . Transthoracic echocardiogram  12-03-2011    LVSF NORMAL/ EF 60-65%  . Vulvectomy N/A 03/02/2013    Procedure: WIDE LOCAL EXCISION VULVAR;  Surgeon: Imagene Gurney A. Alycia Rossetti, MD;  Location: [facility];  Service: Gynecology;  Laterality: N/A;  . Co2 laser application N/A 08/16/2992    Procedure: LASER APPLICATION OF THE VULVA;  Surgeon: Janie Morning, MD;  Location: [facility];  Service: Gynecology;  Laterality: N/A;  . Video bronchoscopy Bilateral 09/23/2013    Procedure: VIDEO BRONCHOSCOPY WITHOUT FLUORO;  Surgeon: Tanda Rockers, MD;  Location: [facility];  Service: Cardiopulmonary;  Laterality: Bilateral;  . Hemiarthroplasty shoulder fracture Left 05/25/2014    DR CHANDLER  . Reverse shoulder arthroplasty Left 05/24/2014    Procedure:   Hemi-Arthroplasty  Left Shoulder;  Surgeon: Nita Sells, MD;  Location: [facility];  Service: Orthopedics;   Laterality: Left;  . Orif humerus fracture Right 12/12/2014    Procedure: OPEN REDUCTION INTERNAL FIXATION (ORIF) PROXIMAL HUMERUS FRACTURE;  Surgeon: Nita Sells, MD;  Location: [facility];  Service: Orthopedics;  Laterality: Right;    Prior to Admission medications   Medication Sig Start Date End Date Taking? Authorizing Provider  albuterol (PROVENTIL) (2.5 MG/3ML) 0.083% nebulizer solution Take 2.5 mg by nebulization as needed for wheezing.   Yes Historical Provider, MD  albuterol (VENTOLIN HFA) 108 (90 BASE) MCG/ACT inhaler Inhale 2 puffs into the lungs every 6 (six) hours as needed for wheezing or shortness of breath. 11/10/14  Yes Curt Bears, MD  acetaminophen (TYLENOL) 325 MG tablet Take 650 mg by mouth every 6 (six) hours as needed for mild pain or moderate pain (TAKES FOR ARM PAIN).     Historical Provider, MD  Aclidinium Bromide (TUDORZA PRESSAIR) 400 MCG/ACT AEPB Inhale 1 puff into the lungs 2 (two) times daily. Patient not taking: Reported on 02/17/2015 11/10/14   Curt Bears, MD  alprazolam Duanne Moron) 2 MG tablet Take 1 tablet (2 mg total) by mouth 2 (two) times daily. 01/23/15   Levonne Spiller, MD  aspirin EC 325 MG EC tablet Take 1 tablet (325 mg total) by mouth 2 (two) times daily. Patient not taking: Reported on 02/17/2015 12/14/14   Debbe Odea, MD  atenolol (TENORMIN) 50 MG tablet Take 1 tablet (50 mg total) by mouth daily. 11/25/13   Velvet Bathe, MD  azithromycin (ZITHROMAX) 250 MG tablet Take 1 tablet (250 mg total) by mouth daily. Take first 2 tablets together, then 1 every day until finished. 02/17/15   Orpah Greek, MD  benzonatate (TESSALON) 100 MG capsule Take 100 mg by mouth 2 (two) times daily.    Historical Provider, MD  chlorzoxazone (PARAFON) 500 MG tablet Take 500 mg by mouth 4 (four) times daily.    Historical Provider, MD  dextromethorphan-guaiFENesin (MUCINEX DM) 30-600 MG per 12 hr tablet Take 2 tablets by mouth 2 (two) times daily.     Historical Provider, MD  diphenoxylate-atropine (LOMOTIL) 2.5-0.025 MG per tablet Take 1 tablet by mouth 2 (two) times daily as needed for diarrhea or loose stools.    Historical Provider, MD  docusate sodium 100 MG CAPS Take 100 mg by mouth 2 (two) times daily. 12/14/14   Debbe Odea, MD  esomeprazole (NEXIUM) 40 MG capsule Take 40 mg by mouth daily.     Historical Provider, MD  fluconazole (DIFLUCAN) 100 MG tablet Take 1 tablet (100 mg total) by mouth daily. Patient not taking: Reported  on 02/17/2015 01/11/15   Debbe Odea, MD  fluticasone (FLONASE) 50 MCG/ACT nasal spray Place 1 spray into both nostrils daily as needed for allergies or rhinitis.    Historical Provider, MD  glucose monitoring kit (FREESTYLE) monitoring kit 1 each by Does not apply route 4 (four) times daily - after meals and at bedtime. 1 month Diabetic Testing Supplies for QAC-QHS accuchecks. 01/11/15   Debbe Odea, MD  insulin aspart (NOVOLOG FLEXPEN) 100 UNIT/ML FlexPen Inject 3 Units into the skin 3 (three) times daily with meals. 01/11/15   Debbe Odea, MD  Insulin Glargine (LANTUS SOLOSTAR) 100 UNIT/ML Solostar Pen Inject 20 Units into the skin daily at 10 pm. 01/11/15   Debbe Odea, MD  Insulin Pen Needle (NOVOFINE) 30G X 8 MM MISC Inject 10 each into the skin 4 (four) times daily. 01/11/15   Debbe Odea, MD  levothyroxine (SYNTHROID) 50 MCG tablet Take 1 tablet (50 mcg total) by mouth daily before breakfast. 11/23/14   Curt Bears, MD  lidocaine (XYLOCAINE) 5 % ointment Apply 1 application topically as needed for mild pain or moderate pain.    Historical Provider, MD  lisinopril (PRINIVIL,ZESTRIL) 5 MG tablet Take 1 tablet (5 mg total) by mouth daily. 01/11/15   Debbe Odea, MD  loperamide (IMODIUM) 2 MG capsule Take 2 mg by mouth as needed for diarrhea or loose stools.    Historical Provider, MD  nystatin ointment (MYCOSTATIN) Apply topically 3 (three) times daily. 01/11/15   Debbe Odea, MD  ondansetron (ZOFRAN ODT) 4  MG disintegrating tablet Take 1 tablet (4 mg total) by mouth every 8 (eight) hours as needed for nausea or vomiting. Patient not taking: Reported on 02/17/2015 04/14/14   Curt Bears, MD  oxyCODONE-acetaminophen (PERCOCET) 10-325 MG per tablet Take 1 tablet by mouth 3 (three) times daily as needed for pain.    Historical Provider, MD  oxyCODONE-acetaminophen (ROXICET) 5-325 MG per tablet Take 1-2 tablets by mouth every 4 (four) hours as needed for severe pain. Patient not taking: Reported on 02/17/2015 12/13/14   Grier Mitts, PA-C  sertraline (ZOLOFT) 100 MG tablet Take 2 tablets (200 mg total) by mouth daily. 01/23/15   Levonne Spiller, MD  tiotropium (SPIRIVA) 18 MCG inhalation capsule Place 1 capsule (18 mcg total) into inhaler and inhale daily. 12/14/14   Debbe Odea, MD  traZODone (DESYREL) 150 MG tablet Take 300 mg by mouth at bedtime.    Historical Provider, MD    Social History:  reports that she has never smoked. She does not have any smokeless tobacco history on file. She reports that she does not drink alcohol or use illicit drugs.  Family History  Problem Relation Age of Onset  . Cancer Mother     COLON  . Depression Mother   . Heart disease Father   . Diabetes Maternal Aunt   . Cancer Maternal Grandfather     KIDNEY   . Diabetes Maternal Grandfather   . Hypertension Maternal Grandfather   . Heart disease Maternal Grandfather   . Drug abuse Sister     Review of Systems:  Unable to obtain given current mental state  Physical Exam: Blood pressure 163/120, pulse 93, temperature 98.6 F (37 C), temperature source Oral, resp. rate 21, SpO2 97 %. General: Awake, rambling, not making much sense. Restless. HEENT: Normocephalic, atraumatic, pupils equal round and reactive, moist mucous membranes, poor dentition. Neck: Supple, no JVD, no lymphadenopathy, no bruits, no goiter.  Cardiovascular: Regular rate and rhythm, no murmurs, rubs  or gallops. Lungs: Clear to  auscultation bilaterally. Abdomen: Soft, nontender, nondistended, positive bowel sounds, no masses or organomegaly noted. Exam extremities: No clubbing, cyanosis or edema, positive pulses. Neurologic: Unable to fully assess given current mental state, however she moves all 4 spontaneously.  Labs on Admission:  Results for orders placed or performed during the hospital encounter of 02/26/15 (from the past 48 hour(s))  CBG monitoring, ED     Status: Abnormal   Collection Time: 02/26/15  8:48 AM  Result Value Ref Range   Glucose-Capillary 308 (H) 70 - 99 mg/dL  CBC with Differential/Platelet     Status: Abnormal   Collection Time: 02/26/15  8:56 AM  Result Value Ref Range   WBC 12.4 (H) 4.0 - 10.5 K/uL   RBC 3.69 (L) 3.87 - 5.11 MIL/uL   Hemoglobin 12.4 12.0 - 15.0 g/dL   HCT 37.4 36.0 - 46.0 %   MCV 101.4 (H) 78.0 - 100.0 fL   MCH 33.6 26.0 - 34.0 pg   MCHC 33.2 30.0 - 36.0 g/dL   RDW 13.0 11.5 - 15.5 %   Platelets 262 150 - 400 K/uL   Neutrophils Relative % 77 43 - 77 %   Neutro Abs 9.5 (H) 1.7 - 7.7 K/uL   Lymphocytes Relative 16 12 - 46 %   Lymphs Abs 1.9 0.7 - 4.0 K/uL   Monocytes Relative 7 3 - 12 %   Monocytes Absolute 0.9 0.1 - 1.0 K/uL   Eosinophils Relative 0 0 - 5 %   Eosinophils Absolute 0.0 0.0 - 0.7 K/uL   Basophils Relative 0 0 - 1 %   Basophils Absolute 0.0 0.0 - 0.1 K/uL  Comprehensive metabolic panel     Status: Abnormal   Collection Time: 02/26/15  8:56 AM  Result Value Ref Range   Sodium 130 (L) 135 - 145 mmol/L   Potassium 2.6 (LL) 3.5 - 5.1 mmol/L    Comment: RESULT REPEATED AND VERIFIED CRITICAL RESULT CALLED TO, READ BACK BY AND VERIFIED WITH: J.KEITH AT 1012 ON 02/26/15 BY S.VANHOORNE    Chloride 92 (L) 96 - 112 mmol/L   CO2 25 19 - 32 mmol/L   Glucose, Bld 301 (H) 70 - 99 mg/dL   BUN 6 6 - 23 mg/dL   Creatinine, Ser 0.80 0.50 - 1.10 mg/dL   Calcium 8.4 8.4 - 10.5 mg/dL   Total Protein 8.1 6.0 - 8.3 g/dL   Albumin 4.4 3.5 - 5.2 g/dL   AST 56 (H) 0  - 37 U/L   ALT 13 0 - 35 U/L   Alkaline Phosphatase 117 39 - 117 U/L   Total Bilirubin 0.5 0.3 - 1.2 mg/dL   GFR calc non Af Amer 75 (L) >90 mL/min   GFR calc Af Amer 86 (L) >90 mL/min    Comment: (NOTE) The eGFR has been calculated using the CKD EPI equation. This calculation has not been validated in all clinical situations. eGFR's persistently <90 mL/min signify possible Chronic Kidney Disease.    Anion gap 13 5 - 15  Troponin I     Status: None   Collection Time: 02/26/15  8:56 AM  Result Value Ref Range   Troponin I <0.03 <0.031 ng/mL    Comment:        NO INDICATION OF MYOCARDIAL INJURY.   Brain natriuretic peptide     Status: Abnormal   Collection Time: 02/26/15  8:57 AM  Result Value Ref Range   B Natriuretic Peptide 468.0 (H)  0.0 - 100.0 pg/mL  Urinalysis, Routine w reflex microscopic     Status: Abnormal   Collection Time: 02/26/15 11:50 AM  Result Value Ref Range   Color, Urine YELLOW YELLOW   APPearance CLEAR CLEAR   Specific Gravity, Urine 1.015 1.005 - 1.030   pH 8.0 5.0 - 8.0   Glucose, UA >1000 (A) NEGATIVE mg/dL   Hgb urine dipstick MODERATE (A) NEGATIVE   Bilirubin Urine NEGATIVE NEGATIVE   Ketones, ur 15 (A) NEGATIVE mg/dL   Protein, ur NEGATIVE NEGATIVE mg/dL   Urobilinogen, UA 0.2 0.0 - 1.0 mg/dL   Nitrite NEGATIVE NEGATIVE   Leukocytes, UA NEGATIVE NEGATIVE  Urine microscopic-add on     Status: None   Collection Time: 02/26/15 11:50 AM  Result Value Ref Range   Squamous Epithelial / LPF RARE RARE   WBC, UA 0-2 <3 WBC/hpf   RBC / HPF 0-2 <3 RBC/hpf   Bacteria, UA RARE RARE    Radiological Exams on Admission: Ct Head Wo Contrast  02/26/2015   CLINICAL DATA:  69 year old female found down at home after presumed fall. Seizure-like activity witnessed in the emergency room. Cervical spine imaging could not be performed secondary to excessive patient motion.  EXAM: CT HEAD WITHOUT CONTRAST  TECHNIQUE: Contiguous axial images were obtained from the  base of the skull through the vertex without intravenous contrast.  COMPARISON:  Prior CT scan of the head 04/27/2014  FINDINGS: Moderate image degradation secondary to excessive motion artifact. Repeat imaging was attempted without significant improvement. Within these limitations, the study is negative for acute intracranial hemorrhage, acute infarction, mass, mass effect, hydrocephalus or midline shift. Gray-white differentiation is preserved throughout. No acute soft tissue or calvarial abnormality. Normal aeration of the bilateral mastoid air cells and visualized paranasal sinuses.  IMPRESSION: No acute intracranial abnormality within the mild limitations of this motion degraded CT scan of the head.   Electronically Signed   By: Jacqulynn Cadet M.D.   On: 02/26/2015 10:04   Dg Chest Portable 1 View  02/26/2015   CLINICAL DATA:  69 year old female status post fall. Altered mental status and combative. Best imaging obtainable.  EXAM: PORTABLE CHEST - 1 VIEW  COMPARISON:  Prior chest x-ray 02/17/2015  FINDINGS: Cardiac and mediastinal contours are grossly unchanged compared to recent prior imaging. No overt pulmonary edema, focal airspace consolidation or pneumothorax. Mild bibasilar atelectasis versus scarring persists. Surgical changes of prior ORIF of a right proximal humerus fracture and left shoulder arthroplasty. No definite acute fracture identified.  IMPRESSION: Limited portable radiograph demonstrates no evidence of acute cardiopulmonary process or fracture.   Electronically Signed   By: Jacqulynn Cadet M.D.   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 -It appears by history that she might have had a seizure in the emergency department. -When I asked patient  what is going on she says she is withdrawing from her Xanax, but she is unable to give me a whole lot of insight and history into her acute process. I have attempted to contact a neighbor and another contact that are listed in the computer however both numbers have been changed. Patient says she does not have any children. -She asks consistently for Xanax and oxycodone. Upon review of medications it appears that she  is currently being prescribed both of these. EMS reported that her house smelled of marijuana.  -Will obtain UDS and alcohol level. -She does say that she ran out of her Xanax a few days ago so she could indeed be withdrawing from these and have had a benzodiazepine withdrawal seizure. We'll place on Ativan withdrawal protocol. We'll continue home doses of Ativan and oxycodone.  -4 probability of seizure we'll go ahead and order an EEG and will request a neurology consultation for tomorrow.  Hypertension  -Continue home medications.  Hypothyroidism -Check TSH and continue home dose of Synthroid.  Hypokalemia -Repleted orally, check magnesium level and replete if necessary.  Diabetes type 2  -place on sliding scale insulin. -Check hemoglobin A1c  DVT prophylaxis  -Subcutaneous heparin  CODE STATUS -Presumed full code.    Time Spent on Admission: 115 minutes  [facility] Pager: [phone] 02/26/2015, 1:27 PM

## 2015-02-26 NOTE — ED Notes (Signed)
CRITICAL VALUE ALERT  Critical value received: Potassium 2.6  Date of notification:  02/26/2015  Time of notification:  9872  Critical value read back:yes  Nurse who received alert:  Randell Loop, RN  MD notified (1st page):  [place]  Time of first page:  1012  MD notified (2nd page):  Time of second page:  Responding MD:  zammit  Time MD responded:  1587

## 2015-02-26 NOTE — ED Provider Notes (Signed)
CSN: [id number]     Arrival date & time 02/26/15  0810 History  This chart was scribed for Maudry Diego, MD by Stephania Fragmin, ED Scribe. This patient was seen in room APA10/APA10 and the patient's care was started at 8:27 AM.    Chief Complaint  Patient presents with  . Fall   Patient is a 69 y.o. female presenting with fall. The history is provided by the EMS personnel. No language interpreter was used.  Fall   LEVEL 5 CAVEAT DUE TO ALTERED MENTAL STATUS  HPI Comments: [name] is a 69 y.o. female who presents to the Emergency Department s/p a fall. Patient was found on the floor by her bed by a friend, who called EMS. Per nursing notes, she was unable to state how or why she fell. Per nurse, patient was alert and oriented upon arrival here, with stable vital sighs, but she suddenly became unresponsive. Seconds later, after nurse had left and returned with assistance, patient's O2 Sat decreased to 72% and her heart rate was in the 200s.  Past Medical History  Diagnosis Date  . H/O: stroke 2012, 2000,     X3  . Anxiety   . PTSD (post-traumatic stress disorder)   . Depression   . SVD (spontaneous vaginal delivery)     x 1  . Smoker   . Osteoarthritis     knees, hips hands,   . Shortness of breath   . Pain     SEVERE PAIN BACK, RIGHT HIP AND KNEES--HARRINGTON RODS AND CERVICAL PLATES--USES WALKER OR CANE WHEN AMBULATING - GOES TO A PAIN CLINIC-STATES SHE NEEDS RT HIP AND BOTH KNEES REPLACED. PT STATES SHE WAS TOLD THE CEMENT AROUND THE HARRINGTON RODS IS CRACKED.  [name] Hot flashes, menopausal     SEVERE  . Complication of anesthesia 03/02/13    severe headache,vertigo postop  . COPD (chronic obstructive pulmonary disease)     stable  . 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 Lung cancer   . Status post radiation therapy within four to twelve weeks 10/18/13-12/06/13    lung ca  66Gy  . Family history of anesthesia complication     equipment failure caused problem, suffered a  stroke & heartattack  . Diabetes mellitus, type II   . Diabetes mellitus without complication   . Cancer   . Hypertension   . COPD (chronic obstructive pulmonary disease)    Past Surgical History  Procedure Laterality Date  . Carpal tunnel release      BILATERAL  . Nasal sinus surgery    . Lumbar fusion      L4-S1  . Cervical fusion    . Hernia repair    . Salpingoophorectomy Right   . Vaginal hysterectomy      TAH w/ ovary removal  . Transthoracic echocardiogram  12-03-2011    LVSF NORMAL/ EF 60-65%  . Vulvectomy N/A 03/02/2013    Procedure: WIDE LOCAL EXCISION VULVAR;  Surgeon: Imagene Gurney A. Alycia Rossetti, MD;  Location: [facility];  Service: Gynecology;  Laterality: N/A;  . Co2 laser application N/A 1/55/2080    Procedure: LASER APPLICATION OF THE VULVA;  Surgeon: Janie Morning, MD;  Location: [facility];  Service: Gynecology;  Laterality: N/A;  . Video bronchoscopy Bilateral 09/23/2013    Procedure: VIDEO BRONCHOSCOPY WITHOUT FLUORO;  Surgeon: Tanda Rockers, MD;  Location: [facility];  Service: Cardiopulmonary;  Laterality: Bilateral;  . Hemiarthroplasty shoulder fracture Left 05/25/2014  DR CHANDLER  . Reverse shoulder arthroplasty Left 05/24/2014    Procedure:   Hemi-Arthroplasty  Left Shoulder;  Surgeon: Justin William Chandler, MD;  Location: [facility];  Service: Orthopedics;  Laterality: Left;  . Orif humerus fracture Right 12/12/2014    Procedure: OPEN REDUCTION INTERNAL FIXATION (ORIF) PROXIMAL HUMERUS FRACTURE;  Surgeon: Justin William Chandler, MD;  Location: [facility];  Service: Orthopedics;  Laterality: Right;   Family History  Problem Relation Age of Onset  . Cancer Mother     COLON  . Depression Mother   . Heart disease Father   . Diabetes Maternal Aunt   . Cancer Maternal Grandfather     KIDNEY   . Diabetes Maternal Grandfather   . Hypertension Maternal Grandfather   . Heart disease Maternal Grandfather   . Drug abuse Sister    History  Substance Use Topics   . Smoking status: Never Smoker   . Smokeless tobacco: Not on file  . Alcohol Use: No   OB History    Gravida Para Term Preterm AB TAB SAB Ectopic Multiple Living   2 1 0 0 1 0 0 0       Review of Systems  Unable to perform ROS: Mental status change      Allergies  Carafate; Prednisone; Nsaids; Aspirin; Aspirin; Ibuprofen; Nsaids; and Dulera  Home Medications   Prior to Admission medications   Medication Sig Start Date End Date Taking? Authorizing Provider  acetaminophen (TYLENOL) 325 MG tablet Take 650 mg by mouth every 6 (six) hours as needed for mild pain or moderate pain (TAKES FOR ARM PAIN).     Historical Provider, MD  Aclidinium Bromide (TUDORZA PRESSAIR) 400 MCG/ACT AEPB Inhale 1 puff into the lungs 2 (two) times daily. Patient not taking: Reported on 02/17/2015 11/10/14   Mohamed Mohamed, MD  albuterol (PROVENTIL) (2.5 MG/3ML) 0.083% nebulizer solution Take 2.5 mg by nebulization as needed for wheezing.    Historical Provider, MD  albuterol (VENTOLIN HFA) 108 (90 BASE) MCG/ACT inhaler Inhale 2 puffs into the lungs every 6 (six) hours as needed for wheezing or shortness of breath. 11/10/14   Mohamed Mohamed, MD  alprazolam (XANAX) 2 MG tablet Take 1 tablet (2 mg total) by mouth 2 (two) times daily. 01/23/15   Deborah Ross, MD  aspirin EC 325 MG EC tablet Take 1 tablet (325 mg total) by mouth 2 (two) times daily. Patient not taking: Reported on 02/17/2015 12/14/14   Saima Rizwan, MD  atenolol (TENORMIN) 50 MG tablet Take 1 tablet (50 mg total) by mouth daily. 11/25/13   Orlando Vega, MD  azithromycin (ZITHROMAX) 250 MG tablet Take 1 tablet (250 mg total) by mouth daily. Take first 2 tablets together, then 1 every day until finished. 02/17/15   Christopher J. Pollina, MD  benzonatate (TESSALON) 100 MG capsule Take 100 mg by mouth 2 (two) times daily.    Historical Provider, MD  chlorzoxazone (PARAFON) 500 MG tablet Take 500 mg by mouth 4 (four) times daily.    Historical Provider,  MD  dextromethorphan-guaiFENesin (MUCINEX DM) 30-600 MG per 12 hr tablet Take 2 tablets by mouth 2 (two) times daily.    Historical Provider, MD  diphenoxylate-atropine (LOMOTIL) 2.5-0.025 MG per tablet Take 1 tablet by mouth 2 (two) times daily as needed for diarrhea or loose stools.    Historical Provider, MD  docusate sodium 100 MG CAPS Take 100 mg by mouth 2 (two) times daily. 12/14/14   Saima Rizwan, MD  esomeprazole (NEXIUM) 40   MG capsule Take 40 mg by mouth daily.     Historical Provider, MD  fluconazole (DIFLUCAN) 100 MG tablet Take 1 tablet (100 mg total) by mouth daily. Patient not taking: Reported on 02/17/2015 01/11/15   Saima Rizwan, MD  fluticasone (FLONASE) 50 MCG/ACT nasal spray Place 1 spray into both nostrils daily as needed for allergies or rhinitis.    Historical Provider, MD  glucose monitoring kit (FREESTYLE) monitoring kit 1 each by Does not apply route 4 (four) times daily - after meals and at bedtime. 1 month Diabetic Testing Supplies for QAC-QHS accuchecks. 01/11/15   Saima Rizwan, MD  insulin aspart (NOVOLOG FLEXPEN) 100 UNIT/ML FlexPen Inject 3 Units into the skin 3 (three) times daily with meals. 01/11/15   Saima Rizwan, MD  Insulin Glargine (LANTUS SOLOSTAR) 100 UNIT/ML Solostar Pen Inject 20 Units into the skin daily at 10 pm. 01/11/15   Saima Rizwan, MD  Insulin Pen Needle (NOVOFINE) 30G X 8 MM MISC Inject 10 each into the skin 4 (four) times daily. 01/11/15   Saima Rizwan, MD  levothyroxine (SYNTHROID) 50 MCG tablet Take 1 tablet (50 mcg total) by mouth daily before breakfast. 11/23/14   Mohamed Mohamed, MD  lidocaine (XYLOCAINE) 5 % ointment Apply 1 application topically as needed for mild pain or moderate pain.    Historical Provider, MD  lisinopril (PRINIVIL,ZESTRIL) 5 MG tablet Take 1 tablet (5 mg total) by mouth daily. 01/11/15   Saima Rizwan, MD  loperamide (IMODIUM) 2 MG capsule Take 2 mg by mouth as needed for diarrhea or loose stools.    Historical Provider, MD   nystatin ointment (MYCOSTATIN) Apply topically 3 (three) times daily. 01/11/15   Saima Rizwan, MD  ondansetron (ZOFRAN ODT) 4 MG disintegrating tablet Take 1 tablet (4 mg total) by mouth every 8 (eight) hours as needed for nausea or vomiting. Patient not taking: Reported on 02/17/2015 04/14/14   Mohamed Mohamed, MD  oxyCODONE-acetaminophen (PERCOCET) 10-325 MG per tablet Take 1 tablet by mouth 3 (three) times daily as needed for pain.    Historical Provider, MD  oxyCODONE-acetaminophen (ROXICET) 5-325 MG per tablet Take 1-2 tablets by mouth every 4 (four) hours as needed for severe pain. Patient not taking: Reported on 02/17/2015 12/13/14   Danielle Laliberte, PA-C  sertraline (ZOLOFT) 100 MG tablet Take 2 tablets (200 mg total) by mouth daily. 01/23/15   Deborah Ross, MD  tiotropium (SPIRIVA) 18 MCG inhalation capsule Place 1 capsule (18 mcg total) into inhaler and inhale daily. 12/14/14   Saima Rizwan, MD  traZODone (DESYREL) 150 MG tablet Take 300 mg by mouth at bedtime.    Historical Provider, MD   There were no vitals taken for this visit. Physical Exam  Constitutional: She is oriented to person, place, and time. She appears well-developed.  Awake, extremely lethargic. Follows commands minimally.  HENT:  Head: Normocephalic.  Eyes: Conjunctivae and EOM are normal. No scleral icterus.  Neck: Neck supple. No thyromegaly present.  Cardiovascular: Normal rate and regular rhythm.  Exam reveals no gallop and no friction rub.   No murmur heard. Pulmonary/Chest: No stridor. She has no wheezes. She has no rales. She exhibits no tenderness.  Crackles heard bilaterally.  Abdominal: She exhibits no distension. There is no tenderness. There is no rebound.  Musculoskeletal: Normal range of motion. She exhibits no edema.  Lymphadenopathy:    She has no cervical adenopathy.  Neurological: She is oriented to person, place, and time. She exhibits normal muscle tone. Coordination normal.  Skin:   No rash  noted. No erythema.  Nursing note and vitals reviewed.   ED Course  Procedures (including critical care time)  DIAGNOSTIC STUDIES: Oxygen Saturation is 96% on room air, normal by my interpretation.    Labs Review Labs Reviewed - No data to display  Imaging Review No results found.   EKG Interpretation   Date/Time:  Sunday February 26 2015 08:39:08 EST Ventricular Rate:  89 PR Interval:  199 QRS Duration: 106 QT Interval:  455 QTC Calculation: 554 R Axis:   79 Text Interpretation:  Sinus rhythm Biatrial enlargement Nonspecific T  abnormalities, diffuse leads Prolonged QT interval Confirmed by Sriya Kroeze   MD, Greydis Stlouis ([id number]) on 02/26/2015 10:43:33 AM     CRITICAL CARE Performed by: Eowyn Tabone L Total critical care time: 35 Critical care time was exclusive of separately billable procedures and treating other patients. Critical care was necessary to treat or prevent imminent or life-threatening deterioration. Critical care was time spent personally by me on the following activities: development of treatment plan with patient and/or surrogate as well as nursing, discussions with consultants, evaluation of patient's response to treatment, examination of patient, obtaining history from patient or surrogate, ordering and performing treatments and interventions, ordering and review of laboratory studies, ordering and review of radiographic studies, pulse oximetry and re-evaluation of patient's condition.   MDM   Final diagnoses:  None    The chart was scribed for me under my direct supervision.  I personally performed the history, physical, and medical decision making and all procedures in the evaluation of this patient..' Admit altered mental status  Maudry Diego, MD 02/26/15 1046

## 2015-02-26 NOTE — ED Notes (Signed)
Safety sitter initiated along with seizure precautions. Seizure pads in place.

## 2015-02-26 NOTE — ED Notes (Signed)
Attempted in and out cath with unsuccessful return.

## 2015-02-27 ENCOUNTER — Inpatient Hospital Stay (HOSPITAL_COMMUNITY): Payer: Medicare Other

## 2015-02-27 ENCOUNTER — Inpatient Hospital Stay (HOSPITAL_COMMUNITY)
Admit: 2015-02-27 | Discharge: 2015-02-27 | Disposition: A | Discharge status: Home / Self Care | Payer: Medicare Other | Attending: Internal Medicine | Admitting: Internal Medicine

## 2015-02-27 DIAGNOSIS — M25579 Pain in unspecified ankle and joints of unspecified foot: Secondary | ICD-10-CM | POA: Diagnosis present

## 2015-02-27 LAB — CBC
HEMATOCRIT: 31.7 % — AB (ref 36.0–46.0)
HEMOGLOBIN: 10.5 g/dL — AB (ref 12.0–15.0)
MCH: 32.8 pg (ref 26.0–34.0)
MCHC: 33.1 g/dL (ref 30.0–36.0)
MCV: 99.1 fL (ref 78.0–100.0)
Platelets: 207 10*3/uL (ref 150–400)
RBC: 3.2 MIL/uL — AB (ref 3.87–5.11)
RDW: 12.8 % (ref 11.5–15.5)
WBC: 5.8 10*3/uL (ref 4.0–10.5)

## 2015-02-27 LAB — COMPREHENSIVE METABOLIC PANEL
ALBUMIN: 3 g/dL — AB (ref 3.5–5.2)
ALK PHOS: 84 U/L (ref 39–117)
ALT: 12 U/L (ref 0–35)
AST: 46 U/L — ABNORMAL HIGH (ref 0–37)
BUN: 5 mg/dL — AB (ref 6–23)
CALCIUM: 7.7 mg/dL — AB (ref 8.4–10.5)
CO2: 32 mmol/L (ref 19–32)
Chloride: 99 mmol/L (ref 96–112)
Creatinine, Ser: 0.68 mg/dL (ref 0.50–1.10)
GFR calc non Af Amer: 89 mL/min — ABNORMAL LOW (ref >90)
GLUCOSE: 131 mg/dL — AB (ref 70–99)
Potassium: 3.8 mmol/L (ref 3.5–5.1)
Sodium: 132 mmol/L — ABNORMAL LOW (ref 135–145)
Total Bilirubin: 0.4 mg/dL (ref 0.3–1.2)
Total Protein: 5.8 g/dL — ABNORMAL LOW (ref 6.0–8.3)

## 2015-02-27 LAB — GLUCOSE, CAPILLARY
GLUCOSE-CAPILLARY: 185 mg/dL — AB (ref 70–99)
Glucose-Capillary: 175 mg/dL — ABNORMAL HIGH (ref 70–99)
Glucose-Capillary: 152 mg/dL — ABNORMAL HIGH (ref 70–99)
Glucose-Capillary: 64 mg/dL — ABNORMAL LOW (ref 70–99)
Glucose-Capillary: 139 mg/dL — ABNORMAL HIGH (ref 70–99)
Glucose-Capillary: 253 mg/dL — ABNORMAL HIGH (ref 70–99)

## 2015-02-27 MED ORDER — ASPIRIN 81 MG PO CHEW
81.0000 mg | CHEWABLE_TABLET | Freq: Every day | ORAL | Status: DC
  Administered 2015-02-28: 81 mg via ORAL
  Filled 2015-02-27: qty 1

## 2015-02-27 MED ORDER — INSULIN ASPART 100 UNIT/ML ~~LOC~~ SOLN
3.0000 [IU] | Freq: Three times a day (TID) | SUBCUTANEOUS | Status: DC
  Administered 2015-02-27 – 2015-02-28 (×4): 3 [IU] via SUBCUTANEOUS

## 2015-02-27 MED ORDER — INSULIN ASPART 100 UNIT/ML ~~LOC~~ SOLN
0.0000 [IU] | Freq: Three times a day (TID) | SUBCUTANEOUS | Status: DC
  Administered 2015-02-27 – 2015-02-28 (×2): 2 [IU] via SUBCUTANEOUS
  Administered 2015-02-28: 3 [IU] via SUBCUTANEOUS
  Administered 2015-02-28: 5 [IU] via SUBCUTANEOUS

## 2015-02-27 MED ORDER — ALBUTEROL SULFATE (2.5 MG/3ML) 0.083% IN NEBU
2.5000 mg | INHALATION_SOLUTION | Freq: Four times a day (QID) | RESPIRATORY_TRACT | Status: DC
  Administered 2015-02-27 – 2015-02-28 (×2): 2.5 mg via RESPIRATORY_TRACT
  Filled 2015-02-27 (×2): qty 3

## 2015-02-27 NOTE — Progress Notes (Signed)
PT Cancellation Note  Patient Details Name: [name] MRN: [id number] DOB: [birth year]   Cancelled Treatment:    Reason Eval/Treat Not Completed: Pain limiting ability to participate.  Pt reports severe pain in the right ankle, unable to move it.  The right ankle is mildly edemetous.  RN and Dyanne Carrel, NP were notified.  X-rays are to be taken to rule out a fx.  Will hold PT until then.   Sable Feil 02/27/2015, 3:12 PM

## 2015-02-27 NOTE — Progress Notes (Signed)
UR chart review completed.  

## 2015-02-27 NOTE — Consult Note (Addendum)
[place] A. Merlene Laughter, MD     [email or web address]          [name] is an 69 y.o. female.   ASSESSMENT/PLAN: 1. Spells of unresponsiveness suspicious for possible seizure. 2. Altered mental status. Potential etiologies includes postseizure confusion, metabolic derangements and the hypothyroidism. 3. Hypothyroidism. 4. 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   RECOMMENDATION: EEG. Additional dementia labs. Seizure medication is not recommended at this time. Restart antiplatelet agent.  The patient is a 69 year old white female who had a spell of unresponsiveness lasted for about 15 minutes. She had another event while in the hospital here. The semiology is that the patient became unresponsive and the muscles became limp. Her eyes did roll back. It does not appear that she had tonic-clonic activity however. Because of the suspicion of possible seizures she was given Ativan IV however. The patient is currently disoriented and cannot provide an adequate history. She reports that she's feeling well however. Since be drowsy and dozes off easily.  GENERAL: She is in no acute distress.  HEENT: Supple. Atraumatic normocephalic.   ABDOMEN: soft  EXTREMITIES: No edema   BACK: Normal.  SKIN: Normal by inspection.    MENTAL STATUS: She wakes spontaneously but dozes off without stimulation. She is disoriented. She thinks that she is in assisted living facility. She does follow commands well. No dysarthria or clear dysphagia is appreciated.  CRANIAL NERVES: Pupils are equal, round and reactive to light; extra ocular movements are full, there is no significant nystagmus; visual fields are full; upper and lower facial muscles are normal in strength and symmetric, there is no flattening of the nasolabial folds; tongue is midline; uvula is midline; shoulder elevation is normal.  MOTOR: Depressed be right sided weakness 4+/5 in upper and lower extremities.  Tone and bulk are normal. Left side shows normal tone, bulk and strength.  COORDINATION: Left finger to nose is normal, right finger to nose is normal, No rest tremor; no intention tremor; no postural tremor; no bradykinesia.  REFLEXES: Deep tendon reflexes are symmetrical and normal. Babinski reflexes are flexor bilaterally.   SENSATION: Normal to light touch.  Head CT scan is reviewed in person and is quite limited by motion artifact but seems otherwise unremarkable. Nothing acute is observed.   Blood pressure 142/73, pulse 70, temperature 99 F (37.2 C), temperature source Oral, resp. rate 16, height 5' 6"  (1.676 m), weight 69.854 kg (154 lb), SpO2 100 %.  Past Medical History  Diagnosis Date  . H/O: stroke 2012, 2000,     X3  . Anxiety   . PTSD (post-traumatic stress disorder)   . Depression   . SVD (spontaneous vaginal delivery)     x 1  . Smoker   . Osteoarthritis     knees, hips hands,   . Shortness of breath   . Pain     SEVERE PAIN BACK, RIGHT HIP AND KNEES--HARRINGTON RODS AND CERVICAL PLATES--USES WALKER OR CANE WHEN AMBULATING - GOES TO A PAIN CLINIC-STATES SHE NEEDS RT HIP AND BOTH KNEES REPLACED. PT STATES SHE WAS TOLD THE CEMENT AROUND THE HARRINGTON RODS IS CRACKED.  [name] Hot flashes, menopausal     SEVERE  . Complication of anesthesia 03/02/13    severe headache,vertigo postop  . COPD (chronic obstructive pulmonary disease)     stable  . 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 Lung cancer   . Status post radiation therapy within four to twelve weeks 10/18/13-12/06/13  lung ca  66Gy  . Family history of anesthesia complication     equipment failure caused problem, suffered a stroke & heartattack  . Diabetes mellitus, type II   . Diabetes mellitus without complication   . Cancer   . Hypertension   . COPD (chronic obstructive pulmonary disease)     Past Surgical History  Procedure Laterality Date  . Carpal tunnel release      BILATERAL  . Nasal sinus surgery     . Lumbar fusion      L4-S1  . Cervical fusion    . Hernia repair    . Salpingoophorectomy Right   . Vaginal hysterectomy      TAH w/ ovary removal  . Transthoracic echocardiogram  12-03-2011    LVSF NORMAL/ EF 60-65%  . Vulvectomy N/A 03/02/2013    Procedure: WIDE LOCAL EXCISION VULVAR;  Surgeon: Imagene Gurney A. Alycia Rossetti, MD;  Location: [facility];  Service: Gynecology;  Laterality: N/A;  . Co2 laser application N/A 05/15/16    Procedure: LASER APPLICATION OF THE VULVA;  Surgeon: Janie Morning, MD;  Location: [facility];  Service: Gynecology;  Laterality: N/A;  . Video bronchoscopy Bilateral 09/23/2013    Procedure: VIDEO BRONCHOSCOPY WITHOUT FLUORO;  Surgeon: Tanda Rockers, MD;  Location: [facility];  Service: Cardiopulmonary;  Laterality: Bilateral;  . Hemiarthroplasty shoulder fracture Left 05/25/2014    DR CHANDLER  . Reverse shoulder arthroplasty Left 05/24/2014    Procedure:   Hemi-Arthroplasty  Left Shoulder;  Surgeon: Nita Sells, MD;  Location: [facility];  Service: Orthopedics;  Laterality: Left;  . Orif humerus fracture Right 12/12/2014    Procedure: OPEN REDUCTION INTERNAL FIXATION (ORIF) PROXIMAL HUMERUS FRACTURE;  Surgeon: Nita Sells, MD;  Location: [facility];  Service: Orthopedics;  Laterality: Right;    Family History  Problem Relation Age of Onset  . Cancer Mother     COLON  . Depression Mother   . Heart disease Father   . Diabetes Maternal Aunt   . Cancer Maternal Grandfather     KIDNEY   . Diabetes Maternal Grandfather   . Hypertension Maternal Grandfather   . Heart disease Maternal Grandfather   . Drug abuse Sister     Social History:  reports that she has never smoked. She does not have any smokeless tobacco history on file. She reports that she does not drink alcohol or use illicit drugs.  Allergies:  Allergies  Allergen Reactions  . Carafate [Sucralfate] Nausea Only  . Prednisone Other (See Comments)    insomnia  . Nsaids      GI Upset  . Aspirin Other (See Comments)    Gi symptoms. Does NOT take ibuprofen or other NSAIDS  . Aspirin Nausea And Vomiting  . Ibuprofen     Gi upset  . Nsaids Nausea And Vomiting  . Dulera [Mometasone Furo-Formoterol Fum] Palpitations    Panting " my body was twisted inside and out"    Medications: Prior to Admission medications   Medication Sig Start Date End Date Taking? Authorizing Provider  albuterol (PROVENTIL) (2.5 MG/3ML) 0.083% nebulizer solution Take 2.5 mg by nebulization as needed for wheezing.   Yes Historical Provider, MD  albuterol (VENTOLIN HFA) 108 (90 BASE) MCG/ACT inhaler Inhale 2 puffs into the lungs every 6 (six) hours as needed for wheezing or shortness of breath. 11/10/14  Yes Curt Bears, MD  acetaminophen (TYLENOL) 325 MG tablet Take 650 mg by mouth every 6 (six) hours as needed for mild  pain or moderate pain (TAKES FOR ARM PAIN).     Historical Provider, MD  Aclidinium Bromide (TUDORZA PRESSAIR) 400 MCG/ACT AEPB Inhale 1 puff into the lungs 2 (two) times daily. Patient not taking: Reported on 02/17/2015 11/10/14   Curt Bears, MD  alprazolam Duanne Moron) 2 MG tablet Take 1 tablet (2 mg total) by mouth 2 (two) times daily. 01/23/15   Levonne Spiller, MD  aspirin EC 325 MG EC tablet Take 1 tablet (325 mg total) by mouth 2 (two) times daily. Patient not taking: Reported on 02/17/2015 12/14/14   Debbe Odea, MD  atenolol (TENORMIN) 50 MG tablet Take 1 tablet (50 mg total) by mouth daily. 11/25/13   Velvet Bathe, MD  azithromycin (ZITHROMAX) 250 MG tablet Take 1 tablet (250 mg total) by mouth daily. Take first 2 tablets together, then 1 every day until finished. 02/17/15   Orpah Greek, MD  benzonatate (TESSALON) 100 MG capsule Take 100 mg by mouth 2 (two) times daily.    Historical Provider, MD  chlorzoxazone (PARAFON) 500 MG tablet Take 500 mg by mouth 4 (four) times daily.    Historical Provider, MD  dextromethorphan-guaiFENesin (MUCINEX DM) 30-600 MG per  12 hr tablet Take 2 tablets by mouth 2 (two) times daily.    Historical Provider, MD  diphenoxylate-atropine (LOMOTIL) 2.5-0.025 MG per tablet Take 1 tablet by mouth 2 (two) times daily as needed for diarrhea or loose stools.    Historical Provider, MD  docusate sodium 100 MG CAPS Take 100 mg by mouth 2 (two) times daily. 12/14/14   Debbe Odea, MD  esomeprazole (NEXIUM) 40 MG capsule Take 40 mg by mouth daily.     Historical Provider, MD  fluconazole (DIFLUCAN) 100 MG tablet Take 1 tablet (100 mg total) by mouth daily. Patient not taking: Reported on 02/17/2015 01/11/15   Debbe Odea, MD  fluticasone (FLONASE) 50 MCG/ACT nasal spray Place 1 spray into both nostrils daily as needed for allergies or rhinitis.    Historical Provider, MD  glucose monitoring kit (FREESTYLE) monitoring kit 1 each by Does not apply route 4 (four) times daily - after meals and at bedtime. 1 month Diabetic Testing Supplies for QAC-QHS accuchecks. 01/11/15   Debbe Odea, MD  insulin aspart (NOVOLOG FLEXPEN) 100 UNIT/ML FlexPen Inject 3 Units into the skin 3 (three) times daily with meals. 01/11/15   Debbe Odea, MD  Insulin Glargine (LANTUS SOLOSTAR) 100 UNIT/ML Solostar Pen Inject 20 Units into the skin daily at 10 pm. 01/11/15   Debbe Odea, MD  Insulin Pen Needle (NOVOFINE) 30G X 8 MM MISC Inject 10 each into the skin 4 (four) times daily. 01/11/15   Debbe Odea, MD  levothyroxine (SYNTHROID) 50 MCG tablet Take 1 tablet (50 mcg total) by mouth daily before breakfast. 11/23/14   Curt Bears, MD  lidocaine (XYLOCAINE) 5 % ointment Apply 1 application topically as needed for mild pain or moderate pain.    Historical Provider, MD  lisinopril (PRINIVIL,ZESTRIL) 5 MG tablet Take 1 tablet (5 mg total) by mouth daily. 01/11/15   Debbe Odea, MD  loperamide (IMODIUM) 2 MG capsule Take 2 mg by mouth as needed for diarrhea or loose stools.    Historical Provider, MD  nystatin ointment (MYCOSTATIN) Apply topically 3 (three) times  daily. 01/11/15   Debbe Odea, MD  ondansetron (ZOFRAN ODT) 4 MG disintegrating tablet Take 1 tablet (4 mg total) by mouth every 8 (eight) hours as needed for nausea or vomiting. Patient not taking: Reported on 02/17/2015  04/14/14   Curt Bears, MD  oxyCODONE-acetaminophen (PERCOCET) 10-325 MG per tablet Take 1 tablet by mouth 3 (three) times daily as needed for pain.    Historical Provider, MD  oxyCODONE-acetaminophen (ROXICET) 5-325 MG per tablet Take 1-2 tablets by mouth every 4 (four) hours as needed for severe pain. Patient not taking: Reported on 02/17/2015 12/13/14   Grier Mitts, PA-C  sertraline (ZOLOFT) 100 MG tablet Take 2 tablets (200 mg total) by mouth daily. 01/23/15   Levonne Spiller, MD  tiotropium (SPIRIVA) 18 MCG inhalation capsule Place 1 capsule (18 mcg total) into inhaler and inhale daily. 12/14/14   Debbe Odea, MD  traZODone (DESYREL) 150 MG tablet Take 300 mg by mouth at bedtime.    Historical Provider, MD    Scheduled Meds: . alprazolam  2 mg Oral BID  . antiseptic oral rinse  7 mL Mouth Rinse BID  . atenolol  50 mg Oral Daily  . chlorzoxazone  500 mg Oral QID  . docusate sodium  100 mg Oral BID  . folic acid  1 mg Oral Daily  . heparin  5,000 Units Subcutaneous 3 times per day  . insulin aspart  0-15 Units Subcutaneous TID WC  . insulin aspart  0-5 Units Subcutaneous QHS  . insulin aspart  4 Units Subcutaneous TID WC  . insulin glargine  20 Units Subcutaneous Q2200  . levothyroxine  50 mcg Oral QAC breakfast  . lisinopril  5 mg Oral Daily  . LORazepam  0-4 mg Intravenous Q6H   Followed by  . [START ON 02/28/2015] LORazepam  0-4 mg Intravenous Q12H  . multivitamin with minerals  1 tablet Oral Daily  . sertraline  200 mg Oral Daily  . thiamine  100 mg Oral Daily   Or  . thiamine  100 mg Intravenous Daily  . tiotropium  18 mcg Inhalation Daily  . traZODone  300 mg Oral QHS   Continuous Infusions: . sodium chloride 75 mL/hr at 02/27/15 0344   PRN  Meds:.LORazepam **OR** LORazepam, ondansetron **OR** ondansetron (ZOFRAN) IV, oxyCODONE-acetaminophen **AND** oxyCODONE     Results for orders placed or performed during the hospital encounter of 02/26/15 (from the past 48 hour(s))  CBG monitoring, ED     Status: Abnormal   Collection Time: 02/26/15  8:48 AM  Result Value Ref Range   Glucose-Capillary 308 (H) 70 - 99 mg/dL  CBC with Differential/Platelet     Status: Abnormal   Collection Time: 02/26/15  8:56 AM  Result Value Ref Range   WBC 12.4 (H) 4.0 - 10.5 K/uL   RBC 3.69 (L) 3.87 - 5.11 MIL/uL   Hemoglobin 12.4 12.0 - 15.0 g/dL   HCT 37.4 36.0 - 46.0 %   MCV 101.4 (H) 78.0 - 100.0 fL   MCH 33.6 26.0 - 34.0 pg   MCHC 33.2 30.0 - 36.0 g/dL   RDW 13.0 11.5 - 15.5 %   Platelets 262 150 - 400 K/uL   Neutrophils Relative % 77 43 - 77 %   Neutro Abs 9.5 (H) 1.7 - 7.7 K/uL   Lymphocytes Relative 16 12 - 46 %   Lymphs Abs 1.9 0.7 - 4.0 K/uL   Monocytes Relative 7 3 - 12 %   Monocytes Absolute 0.9 0.1 - 1.0 K/uL   Eosinophils Relative 0 0 - 5 %   Eosinophils Absolute 0.0 0.0 - 0.7 K/uL   Basophils Relative 0 0 - 1 %   Basophils Absolute 0.0 0.0 - 0.1 K/uL  Comprehensive  metabolic panel     Status: Abnormal   Collection Time: 02/26/15  8:56 AM  Result Value Ref Range   Sodium 130 (L) 135 - 145 mmol/L   Potassium 2.6 (LL) 3.5 - 5.1 mmol/L    Comment: RESULT REPEATED AND VERIFIED CRITICAL RESULT CALLED TO, READ BACK BY AND VERIFIED WITH: J.KEITH AT 1012 ON 02/26/15 BY S.VANHOORNE    Chloride 92 (L) 96 - 112 mmol/L   CO2 25 19 - 32 mmol/L   Glucose, Bld 301 (H) 70 - 99 mg/dL   BUN 6 6 - 23 mg/dL   Creatinine, Ser 0.80 0.50 - 1.10 mg/dL   Calcium 8.4 8.4 - 10.5 mg/dL   Total Protein 8.1 6.0 - 8.3 g/dL   Albumin 4.4 3.5 - 5.2 g/dL   AST 56 (H) 0 - 37 U/L   ALT 13 0 - 35 U/L   Alkaline Phosphatase 117 39 - 117 U/L   Total Bilirubin 0.5 0.3 - 1.2 mg/dL   GFR calc non Af Amer 75 (L) >90 mL/min   GFR calc Af Amer 86 (L) >90  mL/min    Comment: (NOTE) The eGFR has been calculated using the CKD EPI equation. This calculation has not been validated in all clinical situations. eGFR's persistently <90 mL/min signify possible Chronic Kidney Disease.    Anion gap 13 5 - 15  Troponin I     Status: None   Collection Time: 02/26/15  8:56 AM  Result Value Ref Range   Troponin I <0.03 <0.031 ng/mL    Comment:        NO INDICATION OF MYOCARDIAL INJURY.   Brain natriuretic peptide     Status: Abnormal   Collection Time: 02/26/15  8:57 AM  Result Value Ref Range   B Natriuretic Peptide 468.0 (H) 0.0 - 100.0 pg/mL  Urinalysis, Routine w reflex microscopic     Status: Abnormal   Collection Time: 02/26/15 11:50 AM  Result Value Ref Range   Color, Urine YELLOW YELLOW   APPearance CLEAR CLEAR   Specific Gravity, Urine 1.015 1.005 - 1.030   pH 8.0 5.0 - 8.0   Glucose, UA >1000 (A) NEGATIVE mg/dL   Hgb urine dipstick MODERATE (A) NEGATIVE   Bilirubin Urine NEGATIVE NEGATIVE   Ketones, ur 15 (A) NEGATIVE mg/dL   Protein, ur NEGATIVE NEGATIVE mg/dL   Urobilinogen, UA 0.2 0.0 - 1.0 mg/dL   Nitrite NEGATIVE NEGATIVE   Leukocytes, UA NEGATIVE NEGATIVE  Urine microscopic-add on     Status: None   Collection Time: 02/26/15 11:50 AM  Result Value Ref Range   Squamous Epithelial / LPF RARE RARE   WBC, UA 0-2 <3 WBC/hpf   RBC / HPF 0-2 <3 RBC/hpf   Bacteria, UA RARE RARE  Ethanol     Status: None   Collection Time: 02/26/15  1:13 PM  Result Value Ref Range   Alcohol, Ethyl (B) <5 0 - 9 mg/dL    Comment:        LOWEST DETECTABLE LIMIT FOR SERUM ALCOHOL IS 11 mg/dL FOR MEDICAL PURPOSES ONLY   TSH     Status: Abnormal   Collection Time: 02/26/15  1:13 PM  Result Value Ref Range   TSH 83.452 (H) 0.350 - 4.500 uIU/mL  Magnesium     Status: None   Collection Time: 02/26/15  1:33 PM  Result Value Ref Range   Magnesium 2.0 1.5 - 2.5 mg/dL  Urine rapid drug screen (hosp performed)     Status:  Abnormal   Collection  Time: 02/26/15  2:13 PM  Result Value Ref Range   Opiates NONE DETECTED NONE DETECTED   Cocaine NONE DETECTED NONE DETECTED   Benzodiazepines POSITIVE (A) NONE DETECTED   Amphetamines NONE DETECTED NONE DETECTED   Tetrahydrocannabinol NONE DETECTED NONE DETECTED   Barbiturates NONE DETECTED NONE DETECTED    Comment:        DRUG SCREEN FOR MEDICAL PURPOSES ONLY.  IF CONFIRMATION IS NEEDED FOR ANY PURPOSE, NOTIFY LAB WITHIN 5 DAYS.        LOWEST DETECTABLE LIMITS FOR URINE DRUG SCREEN Drug Class       Cutoff (ng/mL) Amphetamine      1000 Barbiturate      200 Benzodiazepine   158 Tricyclics       309 Opiates          300 Cocaine          300 THC              50   Glucose, capillary     Status: Abnormal   Collection Time: 02/26/15  4:58 PM  Result Value Ref Range   Glucose-Capillary 275 (H) 70 - 99 mg/dL  Glucose, capillary     Status: Abnormal   Collection Time: 02/26/15  8:50 PM  Result Value Ref Range   Glucose-Capillary 38 (LL) 70 - 99 mg/dL   Comment 1 Notify RN    Comment 2 Document in Chart   Glucose, capillary     Status: Abnormal   Collection Time: 02/26/15  9:11 PM  Result Value Ref Range   Glucose-Capillary 60 (L) 70 - 99 mg/dL  Glucose, capillary     Status: Abnormal   Collection Time: 02/26/15  9:39 PM  Result Value Ref Range   Glucose-Capillary 175 (H) 70 - 99 mg/dL  Comprehensive metabolic panel     Status: Abnormal   Collection Time: 02/27/15  5:39 AM  Result Value Ref Range   Sodium 132 (L) 135 - 145 mmol/L   Potassium 3.8 3.5 - 5.1 mmol/L    Comment: DELTA CHECK NOTED   Chloride 99 96 - 112 mmol/L   CO2 32 19 - 32 mmol/L   Glucose, Bld 131 (H) 70 - 99 mg/dL   BUN 5 (L) 6 - 23 mg/dL   Creatinine, Ser 0.68 0.50 - 1.10 mg/dL   Calcium 7.7 (L) 8.4 - 10.5 mg/dL   Total Protein 5.8 (L) 6.0 - 8.3 g/dL   Albumin 3.0 (L) 3.5 - 5.2 g/dL   AST 46 (H) 0 - 37 U/L   ALT 12 0 - 35 U/L   Alkaline Phosphatase 84 39 - 117 U/L   Total Bilirubin 0.4 0.3 - 1.2  mg/dL   GFR calc non Af Amer 89 (L) >90 mL/min   GFR calc Af Amer >90 >90 mL/min    Comment: (NOTE) The eGFR has been calculated using the CKD EPI equation. This calculation has not been validated in all clinical situations. eGFR's persistently <90 mL/min signify possible Chronic Kidney Disease.    Anion gap NOT CALCULATED 5 - 15  CBC     Status: Abnormal   Collection Time: 02/27/15  5:39 AM  Result Value Ref Range   WBC 5.8 4.0 - 10.5 K/uL   RBC 3.20 (L) 3.87 - 5.11 MIL/uL   Hemoglobin 10.5 (L) 12.0 - 15.0 g/dL   HCT 31.7 (L) 36.0 - 46.0 %   MCV 99.1 78.0 - 100.0 fL   MCH  32.8 26.0 - 34.0 pg   MCHC 33.1 30.0 - 36.0 g/dL   RDW 12.8 11.5 - 15.5 %   Platelets 207 150 - 400 K/uL  Glucose, capillary     Status: Abnormal   Collection Time: 02/27/15  7:46 AM  Result Value Ref Range   Glucose-Capillary 152 (H) 70 - 99 mg/dL    Studies/Results:  HEAD CT Moderate image degradation secondary to excessive motion artifact. Repeat imaging was attempted without significant improvement. Within these limitations, the study is negative for acute intracranial hemorrhage, acute infarction, mass, mass effect, hydrocephalus or midline shift. Gray-white differentiation is preserved throughout. No acute soft tissue or calvarial abnormality. Normal aeration of the bilateral mastoid air cells and visualized paranasal sinuses.  IMPRESSION: No acute intracranial abnormality within the mild limitations of this motion degraded CT scan of the head.   Malvika Tung A. Merlene Laughter, M.D.  Diplomate, Tax adviser of Psychiatry and Neurology ( Neurology). 02/27/2015, 8:24 AM

## 2015-02-27 NOTE — Clinical Social Work Note (Signed)
CSW left voicemail this morning for Sharee Pimple at [facility] requesting return call to discuss plan for pt to be admitted there as arranged by family. Awaiting return call and PT evaluation which is on hold until tomorrow due to ankle pain. CSW will follow up in AM.  Benay Pike, [place] [facility] [place]

## 2015-02-27 NOTE — Progress Notes (Signed)
Patient complaining of right ankle pain. Dyanne Carrel NP notified.

## 2015-02-27 NOTE — Progress Notes (Signed)
[facility] PROGRESS NOTE  [name] WVP:[id number] DOB: Aug 29, 1946 DOA: 02/26/2015 PCP: [name], MD  Assessment/Plan: Acute encephalopathy/?seizure/?BDZ Withdrawal - much improved this am. Etiology remains unclear. It appears by history that she might have had a seizure in the emergency department. UDS + benzos only. Alcohol level unremarkable. CT head no acute intracranial abnormality. She reported having run out of ativan so may be withdrawal especially since she has improved now that she is getting home doses.  Continue Ativan withdrawal protocol. Continue home doses of Ativan and oxycodone. Await EEG. Await neurology input  Hypertension  - controlled. Continue home medications.  Hypothyroidism -TSH 83.6. Will obtain free T3 and T4. Increase synthroid.  Hypokalemia - resolved this am. Magnesium level within limits of normal.   Diabetes type 2  -A1c pending. Hypoglycemia this am. Will hold lantus for now and change SSI to sensative as appetite unreliable. Will decrease meal coverage as well.    Right ankle pain: s/p fall last week. Will obtain xray. Pt   Code Status: full Family Communication: friend on phone Disposition Plan: facility   Consultants:  neurology  Procedures:  EEG  Antibiotics:  none  HPI/Subjective: Awake alert. Reports right ankle pain.   Objective: Filed Vitals:   02/27/15 1430  BP: 116/65  Pulse: 69  Temp: 98.6 F (37 C)  Resp: 18    Intake/Output Summary (Last 24 hours) at 02/27/15 1552 Last data filed at 02/27/15 1225  Gross per 24 hour  Intake 2137.5 ml  Output   1000 ml  Net 1137.5 ml   Filed Weights   02/26/15 1501  Weight: 69.854 kg (154 lb)    Exam:   General:  somwhat frail appearing  Cardiovascular: rrr no MGR no LE edema  Respiratory: normal effort BS with good air flow diffuse faint wheeze  Abdomen: soft +BS non-tender to palpation  Musculoskeletal: right ankle without swelling/  erythema/brouising. Decreased ROM due to pain. Very tender to palpation.    Data Reviewed: Basic Metabolic Panel:  Recent Labs Lab 02/26/15 0856 02/26/15 1333 02/27/15 0539  NA 130*  --  132*  K 2.6*  --  3.8  CL 92*  --  99  CO2 25  --  32  GLUCOSE 301*  --  131*  BUN 6  --  5*  CREATININE 0.80  --  0.68  CALCIUM 8.4  --  7.7*  MG  --  2.0  --    Liver Function Tests:  Recent Labs Lab 02/26/15 0856 02/27/15 0539  AST 56* 46*  ALT 13 12  ALKPHOS 117 84  BILITOT 0.5 0.4  PROT 8.1 5.8*  ALBUMIN 4.4 3.0*   No results for input(s): LIPASE, AMYLASE in the last 168 hours. No results for input(s): AMMONIA in the last 168 hours. CBC:  Recent Labs Lab 02/26/15 0856 02/27/15 0539  WBC 12.4* 5.8  NEUTROABS 9.5*  --   HGB 12.4 10.5*  HCT 37.4 31.7*  MCV 101.4* 99.1  PLT 262 207   Cardiac Enzymes:  Recent Labs Lab 02/26/15 0856  TROPONINI <0.03   BNP (last 3 results)  Recent Labs  02/26/15 0857  BNP 468.0*    ProBNP (last 3 results) No results for input(s): PROBNP in the last 8760 hours.  CBG:  Recent Labs Lab 02/26/15 2111 02/26/15 2139 02/27/15 0746 02/27/15 1130 02/27/15 1453  GLUCAP 60* 175* 152* 64* 139*    No results found for this or any previous visit (from the past 240 hour(s)).  Studies: Dg Ankle 2 Views Right  03/26/2015   CLINICAL DATA:  Pain for 3 days  EXAM: RIGHT ANKLE - 2 VIEW  COMPARISON:  None.  FINDINGS: There is no evidence of fracture, dislocation, or joint effusion. There is no evidence of arthropathy or other focal bone abnormality. Soft tissues are unremarkable.  IMPRESSION: Negative.   Electronically Signed   By: Kerby Moors M.D.   On: 03/26/2015 15:31   Ct Head Wo Contrast  02/26/2015   CLINICAL DATA:  69 year old female found down at home after presumed fall. Seizure-like activity witnessed in the emergency room. Cervical spine imaging could not be performed secondary to excessive patient motion.  EXAM: CT HEAD  WITHOUT CONTRAST  TECHNIQUE: Contiguous axial images were obtained from the base of the skull through the vertex without intravenous contrast.  COMPARISON:  Prior CT scan of the head 04/27/2014  FINDINGS: Moderate image degradation secondary to excessive motion artifact. Repeat imaging was attempted without significant improvement. Within these limitations, the study is negative for acute intracranial hemorrhage, acute infarction, mass, mass effect, hydrocephalus or midline shift. Gray-white differentiation is preserved throughout. No acute soft tissue or calvarial abnormality. Normal aeration of the bilateral mastoid air cells and visualized paranasal sinuses.  IMPRESSION: No acute intracranial abnormality within the mild limitations of this motion degraded CT scan of the head.   Electronically Signed   By: Jacqulynn Cadet M.D.   On: 02/26/2015 10:04   Dg Chest Portable 1 View  02/26/2015   CLINICAL DATA:  69 year old female status post fall. Altered mental status and combative. Best imaging obtainable.  EXAM: PORTABLE CHEST - 1 VIEW  COMPARISON:  Prior chest x-ray 02/17/2015  FINDINGS: Cardiac and mediastinal contours are grossly unchanged compared to recent prior imaging. No overt pulmonary edema, focal airspace consolidation or pneumothorax. Mild bibasilar atelectasis versus scarring persists. Surgical changes of prior ORIF of a right proximal humerus fracture and left shoulder arthroplasty. No definite acute fracture identified.  IMPRESSION: Limited portable radiograph demonstrates no evidence of acute cardiopulmonary process or fracture.   Electronically Signed   By: Jacqulynn Cadet M.D.   On: 02/26/2015 09:38    Scheduled Meds: . alprazolam  2 mg Oral BID  . antiseptic oral rinse  7 mL Mouth Rinse BID  . atenolol  50 mg Oral Daily  . chlorzoxazone  500 mg Oral QID  . docusate sodium  100 mg Oral BID  . folic acid  1 mg Oral Daily  . heparin  5,000 Units Subcutaneous 3 times per day  .  insulin aspart  0-5 Units Subcutaneous QHS  . insulin aspart  0-9 Units Subcutaneous TID WC  . insulin aspart  4 Units Subcutaneous TID WC  . levothyroxine  50 mcg Oral QAC breakfast  . lisinopril  5 mg Oral Daily  . LORazepam  0-4 mg Intravenous Q6H   Followed by  . [START ON 02/28/2015] LORazepam  0-4 mg Intravenous Q12H  . multivitamin with minerals  1 tablet Oral Daily  . sertraline  200 mg Oral Daily  . thiamine  100 mg Oral Daily   Or  . thiamine  100 mg Intravenous Daily  . tiotropium  18 mcg Inhalation Daily  . traZODone  300 mg Oral QHS   Continuous Infusions: . 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 If 7PM-7AM, please contact night-coverage at [email or web address], password [facility] 02/27/2015, 3:52 PM  LOS: 1 day

## 2015-02-27 NOTE — Progress Notes (Signed)
EEG Completed; Results Pending  

## 2015-02-27 NOTE — Progress Notes (Signed)
IV attempts made by 2 nurses. PACU called to come attempt.

## 2015-02-27 NOTE — Progress Notes (Addendum)
Inpatient Diabetes Program Recommendations  AACE/ADA: New Consensus Statement on Inpatient Glycemic Control (2013)  Target Ranges:  Prepandial:   less than 140 mg/dL      Peak postprandial:   less than 180 mg/dL (1-2 hours)      Critically ill patients:  140 - 180 mg/dL   Results for SANTINA, TRILLO (MRN [id number]) as of 02/27/2015 14:55  Ref. Range 02/26/2015 08:48 02/26/2015 16:58 02/26/2015 20:50 02/26/2015 21:11 02/26/2015 21:39 02/27/2015 07:46 02/27/2015 11:30  Glucose-Capillary Latest Range: 70-99 mg/dL 308 (H) 275 (H) 38 (LL) 60 (L) 175 (H) 152 (H) 64 (L)   Diabetes history: DM2 (dx during last hospitalization in January 2016 Outpatient Diabetes medications: Lantus 20 units QHS, Novolog 5 units TID with meals Current orders for Inpatient glycemic control: Lantus 20 units QHS, Novolog 0-15 units TID with meals, Novolog 0-5 units HS, Novolog 4 units TID with meals  Inpatient Diabetes Program Recommendations Insulin - Basal: In reveiwing the chart, noted that Lantus was NOT GIVEN last night. May want to consider discontinuing Lantus at this time and add back if needed. Correction (SSI): Noted CBG of 275 mg/dl at 16:58 and patient received Novolog 12 units at 17:13 (8 units for correction and 4 units for meal coverage). Following CBG was 38 mg/dl at 20:50.Please consider decreasing Novolog correction to senstive scale. Insulin - Meal Coverage: Noted CBG of 275 mg/dl at 16:58 and patient received Novolog 12 units at 17:13 (8 units for correction and 4 units for meal coverage). Following CBG was 38 mg/dl at 20:50. NURSING: Please be sure patient eats at least 50% of meal before giving meal coverage. MD, please consider decreasing meal coverage to 3 units TID with meals.   Note:  In reviewing the chart, noted patient was hospitalized from Jan 07, 2015 to Jan 11, 2015. During that hospitalization patient was diagnosed with new onset DM and was prescribed Lantus and Novolog insulin at discharge.  Patient has experienced 2 episodes of hypoglycemia since being admitted. Patient did NOT receive Lantus last night as ordered (charted as NOT GIVEN). Fasting glucose is 152 mg/dl this morning. Patient's glucose has dropped twice after receiving Novolog correction and meal coverage. Will continue to follow.    Thanks, Barnie Alderman, RN, MSN, CCRN, CDE Diabetes Coordinator Inpatient Diabetes Program [phone] (Team Pager) [phone] ([facility] office) [phone] [facility] office)

## 2015-02-27 NOTE — Care Management Note (Addendum)
    Page 1 of 2   02/28/2015     2:16:40 PM CARE MANAGEMENT NOTE 02/28/2015  Patient:  [name], [name]   Account Number:  [id number]  Date Initiated:  02/27/2015  Documentation initiated by:  Theophilus Kinds  Subjective/Objective Assessment:   Pt admitted from home with encephalopahy and probable benzo withdrawals. Pt lives alone and has arranged placement at [facility] before pt had to be hospitalized. Pt plans to discharge to ALF. Pt has a walker for home use and active with     Action/Plan:   [facility]. CSW is aware and will arrange discharge to facility.   Anticipated DC Date:  03/01/2015   Anticipated DC Plan:  ASSISTED LIVING / REST HOME  In-house referral  Clinical Social Worker      DC Planning Services  CM consult      [facility] Choice  Resumption Of Svcs/PTA Provider   Choice offered to / List presented to:  C-1 Patient        [place] arranged  HH-1 RN  [place] agency  [place]   Status of service:  Completed, signed off Medicare Important Message given?   (If response is "NO", the following Medicare IM given date fields will be blank) Date Medicare IM given:   Medicare IM given by:   Date Additional Medicare IM given:   Additional Medicare IM given by:    Discharge Disposition:  ASSISTED LIVING  Per UR Regulation:    If discussed at Long Length of Stay Meetings, dates discussed:    Comments:  02/28/15 [place], RN BSN [place] has no beds available to accept pt today and pt medically stable for discharge. Pt discharged home with resumption of [facility] RN and PT, aide, and CSW (per pts choice). Referral called to Sheppard Evens with [facility] and she will collect the pts information from the chart. [place] services to start within 48 hours of discharge. Pt has continuous home O2 with [employer] and [employer] will deliver pts portable to get home on. Pt and pts nurse aware of discharge arrangements.  02/27/15 [place], RN BSN  CM

## 2015-02-28 LAB — BASIC METABOLIC PANEL
BUN: 8 mg/dL (ref 6–23)
CALCIUM: 7.6 mg/dL — AB (ref 8.4–10.5)
CHLORIDE: 102 mmol/L (ref 96–112)
CO2: 30 mmol/L (ref 19–32)
Creatinine, Ser: 0.66 mg/dL (ref 0.50–1.10)
GFR calc Af Amer: >90 mL/min (ref >90)
GFR calc non Af Amer: 89 mL/min — ABNORMAL LOW (ref >90)
Glucose, Bld: 252 mg/dL — ABNORMAL HIGH (ref 70–99)
Potassium: 4.1 mmol/L (ref 3.5–5.1)
Sodium: 134 mmol/L — ABNORMAL LOW (ref 135–145)

## 2015-02-28 LAB — GLUCOSE, CAPILLARY
Glucose-Capillary: 237 mg/dL — ABNORMAL HIGH (ref 70–99)
Glucose-Capillary: 300 mg/dL — ABNORMAL HIGH (ref 70–99)
Glucose-Capillary: 162 mg/dL — ABNORMAL HIGH (ref 70–99)

## 2015-02-28 LAB — CBC
HEMATOCRIT: 32.5 % — AB (ref 36.0–46.0)
HEMOGLOBIN: 10.5 g/dL — AB (ref 12.0–15.0)
MCH: 33 pg (ref 26.0–34.0)
MCHC: 32.3 g/dL (ref 30.0–36.0)
MCV: 102.2 fL — ABNORMAL HIGH (ref 78.0–100.0)
Platelets: 184 10*3/uL (ref 150–400)
RBC: 3.18 MIL/uL — ABNORMAL LOW (ref 3.87–5.11)
RDW: 13 % (ref 11.5–15.5)
WBC: 5.1 10*3/uL (ref 4.0–10.5)

## 2015-02-28 LAB — T4, FREE: Free T4: 0.3 ng/dL — ABNORMAL LOW (ref 0.80–1.80)

## 2015-02-28 LAB — T3, FREE: T3 FREE: 0.8 pg/mL — AB (ref 2.0–4.4)

## 2015-02-28 LAB — VITAMIN B12: Vitamin B-12: 388 pg/mL (ref 211–911)

## 2015-02-28 MED ORDER — LEVOTHYROXINE SODIUM 50 MCG PO TABS: 75.0000 ug | ORAL_TABLET | Freq: Every day | ORAL | Status: DC

## 2015-02-28 MED ORDER — ALBUTEROL SULFATE (2.5 MG/3ML) 0.083% IN NEBU
2.5000 mg | INHALATION_SOLUTION | Freq: Three times a day (TID) | RESPIRATORY_TRACT | Status: DC
  Administered 2015-02-28: 2.5 mg via RESPIRATORY_TRACT
  Filled 2015-02-28: qty 3

## 2015-02-28 MED ORDER — ALBUTEROL SULFATE (2.5 MG/3ML) 0.083% IN NEBU
2.5000 mg | INHALATION_SOLUTION | Freq: Two times a day (BID) | RESPIRATORY_TRACT | Status: DC
  Administered 2015-02-28: 2.5 mg via RESPIRATORY_TRACT
  Filled 2015-02-28: qty 3

## 2015-02-28 MED ORDER — ALPRAZOLAM 2 MG PO TABS: 2.0000 mg | ORAL_TABLET | Freq: Two times a day (BID) | ORAL | Status: DC

## 2015-02-28 MED ORDER — OXYCODONE-ACETAMINOPHEN 10-325 MG PO TABS: 1.0000 | ORAL_TABLET | Freq: Four times a day (QID) | ORAL | Status: DC | PRN

## 2015-02-28 MED ORDER — ASPIRIN 81 MG PO CHEW: 81.0000 mg | CHEWABLE_TABLET | Freq: Every day | ORAL | Status: DC

## 2015-02-28 MED ORDER — ALPRAZOLAM 2 MG PO TABS: 2.0000 mg | ORAL_TABLET | Freq: Three times a day (TID) | ORAL | Status: DC

## 2015-02-28 NOTE — Procedures (Signed)
Gabriela Lucas A. Merlene Laughter, MD     [email or web address]           HISTORY: The patient is a 69 year old presents with confusion. This has been done to evaluate for seizures as a cause of the confusion.  MEDICATIONS: Scheduled Meds: . albuterol  2.5 mg Nebulization Q6H  . alprazolam  2 mg Oral BID  . antiseptic oral rinse  7 mL Mouth Rinse BID  . aspirin  81 mg Oral Daily  . atenolol  50 mg Oral Daily  . chlorzoxazone  500 mg Oral QID  . docusate sodium  100 mg Oral BID  . folic acid  1 mg Oral Daily  . heparin  5,000 Units Subcutaneous 3 times per day  . insulin aspart  0-5 Units Subcutaneous QHS  . insulin aspart  0-9 Units Subcutaneous TID WC  . insulin aspart  3 Units Subcutaneous TID WC  . levothyroxine  50 mcg Oral QAC breakfast  . lisinopril  5 mg Oral Daily  . LORazepam  0-4 mg Intravenous Q6H   Followed by  . LORazepam  0-4 mg Intravenous Q12H  . multivitamin with minerals  1 tablet Oral Daily  . sertraline  200 mg Oral Daily  . thiamine  100 mg Oral Daily   Or  . thiamine  100 mg Intravenous Daily  . tiotropium  18 mcg Inhalation Daily  . traZODone  300 mg Oral QHS   Continuous Infusions: . sodium chloride 75 mL/hr at 02/27/15 1958   PRN Meds:.LORazepam **OR** LORazepam, ondansetron **OR** ondansetron (ZOFRAN) IV, oxyCODONE-acetaminophen **AND** oxyCODONE  Prior to Admission medications   Medication Sig Start Date End Date Taking? Authorizing Provider  acetaminophen (TYLENOL) 325 MG tablet Take 650 mg by mouth every 6 (six) hours as needed for mild pain or moderate pain (TAKES FOR ARM PAIN).    Yes Historical Provider, MD  albuterol (PROVENTIL) (2.5 MG/3ML) 0.083% nebulizer solution Take 2.5 mg by nebulization as needed for wheezing.   Yes Historical Provider, MD  albuterol (VENTOLIN HFA) 108 (90 BASE) MCG/ACT inhaler Inhale 2 puffs into the lungs every 6 (six) hours as needed for wheezing or shortness of breath. 11/10/14  Yes Curt Bears, MD    alprazolam Duanne Moron) 2 MG tablet Take 1 tablet (2 mg total) by mouth 2 (two) times daily. Patient taking differently: Take 2 mg by mouth 3 (three) times daily.  01/23/15  Yes Levonne Spiller, MD  atenolol (TENORMIN) 50 MG tablet Take 1 tablet (50 mg total) by mouth daily. 11/25/13  Yes Velvet Bathe, MD  azithromycin (ZITHROMAX) 250 MG tablet Take 1 tablet (250 mg total) by mouth daily. Take first 2 tablets together, then 1 every day until finished. 02/17/15  Yes Orpah Greek, MD  benzonatate (TESSALON) 100 MG capsule Take 100 mg by mouth 2 (two) times daily as needed for cough.    Yes Historical Provider, MD  chlorzoxazone (PARAFON) 500 MG tablet Take 500 mg by mouth 4 (four) times daily.   Yes Historical Provider, MD  diphenoxylate-atropine (LOMOTIL) 2.5-0.025 MG per tablet Take 1 tablet by mouth 2 (two) times daily as needed for diarrhea or loose stools.   Yes Historical Provider, MD  esomeprazole (NEXIUM) 40 MG capsule Take 40 mg by mouth daily.    Yes Historical Provider, MD  fluticasone (FLONASE) 50 MCG/ACT nasal spray Place 1 spray into both nostrils daily as needed for allergies or rhinitis.   Yes Historical Provider, MD  insulin aspart (NOVOLOG FLEXPEN) 100 UNIT/ML FlexPen  Inject 3 Units into the skin 3 (three) times daily with meals. Patient taking differently: Inject 5 Units into the skin 3 (three) times daily with meals.  01/11/15  Yes Debbe Odea, MD  Insulin Glargine (LANTUS SOLOSTAR) 100 UNIT/ML Solostar Pen Inject 20 Units into the skin daily at 10 pm. 01/11/15  Yes Debbe Odea, MD  levothyroxine (SYNTHROID) 50 MCG tablet Take 1 tablet (50 mcg total) by mouth daily before breakfast. 11/23/14  Yes Curt Bears, MD  lidocaine (XYLOCAINE) 5 % ointment Apply 1 application topically as needed for mild pain or moderate pain.   Yes Historical Provider, MD  lisinopril (PRINIVIL,ZESTRIL) 5 MG tablet Take 1 tablet (5 mg total) by mouth daily. 01/11/15  Yes Debbe Odea, MD  loperamide  (IMODIUM) 2 MG capsule Take 2 mg by mouth as needed for diarrhea or loose stools.   Yes Historical Provider, MD  nystatin ointment (MYCOSTATIN) Apply topically 3 (three) times daily. 01/11/15  Yes Debbe Odea, MD  oxyCODONE (OXY IR/ROXICODONE) 5 MG immediate release tablet Take 5-10 mg by mouth 3 (three) times daily as needed for severe pain.   Yes Historical Provider, MD  oxyCODONE-acetaminophen (PERCOCET) 10-325 MG per tablet Take 1 tablet by mouth every 6 (six) hours as needed for pain.    Yes Historical Provider, MD  promethazine (PHENERGAN) 25 MG tablet Take 25 mg by mouth every 6 (six) hours as needed for nausea or vomiting.   Yes Historical Provider, MD  sertraline (ZOLOFT) 100 MG tablet Take 2 tablets (200 mg total) by mouth daily. 01/23/15  Yes Levonne Spiller, MD  tiotropium (SPIRIVA) 18 MCG inhalation capsule Place 1 capsule (18 mcg total) into inhaler and inhale daily. 12/14/14  Yes Debbe Odea, MD  traZODone (DESYREL) 150 MG tablet Take 300 mg by mouth at bedtime.   Yes Historical Provider, MD  dextromethorphan-guaiFENesin (MUCINEX DM) 30-600 MG per 12 hr tablet Take 2 tablets by mouth 2 (two) times daily.    Historical Provider, MD  glucose monitoring kit (FREESTYLE) monitoring kit 1 each by Does not apply route 4 (four) times daily - after meals and at bedtime. 1 month Diabetic Testing Supplies for QAC-QHS accuchecks. 01/11/15   Debbe Odea, MD  Insulin Pen Needle (NOVOFINE) 30G X 8 MM MISC Inject 10 each into the skin 4 (four) times daily. 01/11/15   Debbe Odea, MD      ANALYSIS: A 16 channel recording using standard 10 20 measurements is conducted for 25 minutes. There is a posterior background activity of 12 Hz which attenuates with eye opening. There is beta activity observed in frontal areas. Awake and sleep activities are observed. Spindles indicating stage II non-REM sleep is recorded. Photic stimulation and hyperventilation are not carried out. There are no focal or lateral  slowing. There is no epileptiform activity observed.   IMPRESSION: 1. This a normal recording of awake and sleep states.      Aldo Sondgeroth A. Merlene [name], M.D.  Diplomate, Tax adviser of Psychiatry and Neurology ( Neurology).

## 2015-02-28 NOTE — Progress Notes (Signed)
Patient ID: [name], female   DOB: 11-05-1946, 69 y.o.   MRN: [id number]   Gabriela A. Merlene Laughter, MD     [email or web address]          [name] is an 69 y.o. female.   Assessment/Plan: 1. Spells of unresponsiveness suspicious for possible seizure. 2. Altered mental status. Potential etiologies includes postseizure confusion, metabolic derangements and the hypothyroidism. 3. Hypothyroidism. 4. 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   RECOMMENDATION: Additional dementia labs. Seizure medication is not recommended at this time. Restart antiplatelet agent.  The patient seems to be improved today. She is likely at baseline. She is asking for normal things such as her hair being messed up after her EEG. She also asked him to be in our pain clinic.  GENERAL: She is in no acute distress.  HEENT: Supple. Atraumatic normocephalic.   ABDOMEN: soft  EXTREMITIES: No edema   BACK: Normal.  SKIN: Normal by inspection.   MENTAL STATUS: She is much more responsive. She is oriented 3. She follows commands well and speech is normal.  CRANIAL NERVES: Pupils are equal, round and reactive to light; extra ocular movements are full, there is no significant nystagmus; visual fields are full; upper and lower facial muscles are normal in strength and symmetric, there is no flattening of the nasolabial folds; tongue is midline; uvula is midline; shoulder elevation is normal.  MOTOR: Depressed be right sided weakness 4+/5 in upper and lower extremities. Tone and bulk are normal. Left side shows normal tone, bulk and strength.  COORDINATION: Left finger to nose is normal, right finger to nose is normal, No rest tremor; no intention tremor; no postural tremor; no bradykinesia.  GAIT: She ambulates with a walker with the physical therapist.  EEG is normal.   Objective: Vital signs in last 24 hours: Temp:  [97.6 F (36.4 C)-98.7 F (37.1 C)] 98.7  F (37.1 C) (03/01 0608) Pulse Rate:  [64-69] 69 (03/01 0608) Resp:  [16-18] 16 (03/01 0608) BP: (114-132)/(59-71) 131/59 mmHg (03/01 0608) SpO2:  [91 %-99 %] 91 % (03/01 0657)  Intake/Output from previous day: 02/29 0701 - 03/01 0700 In: 1232 [P.O.:480; I.V.:752] Out: 450 [Urine:450] Intake/Output this shift:   Nutritional status: Diet Carb Modified   Lab Results: Results for orders placed or performed during the hospital encounter of 02/26/15 (from the past 48 hour(s))  Urinalysis, Routine w reflex microscopic     Status: Abnormal   Collection Time: 02/26/15 11:50 AM  Result Value Ref Range   Color, Urine YELLOW YELLOW   APPearance CLEAR CLEAR   Specific Gravity, Urine 1.015 1.005 - 1.030   pH 8.0 5.0 - 8.0   Glucose, UA >1000 (A) NEGATIVE mg/dL   Hgb urine dipstick MODERATE (A) NEGATIVE   Bilirubin Urine NEGATIVE NEGATIVE   Ketones, ur 15 (A) NEGATIVE mg/dL   Protein, ur NEGATIVE NEGATIVE mg/dL   Urobilinogen, UA 0.2 0.0 - 1.0 mg/dL   Nitrite NEGATIVE NEGATIVE   Leukocytes, UA NEGATIVE NEGATIVE  Urine microscopic-add on     Status: None   Collection Time: 02/26/15 11:50 AM  Result Value Ref Range   Squamous Epithelial / LPF RARE RARE   WBC, UA 0-2 <3 WBC/hpf   RBC / HPF 0-2 <3 RBC/hpf   Bacteria, UA RARE RARE  Ethanol     Status: None   Collection Time: 02/26/15  1:13 PM  Result Value Ref Range   Alcohol, Ethyl (B) <5 0 - 9 mg/dL  Comment:        LOWEST DETECTABLE LIMIT FOR SERUM ALCOHOL IS 11 mg/dL FOR MEDICAL PURPOSES ONLY   TSH     Status: Abnormal   Collection Time: 02/26/15  1:13 PM  Result Value Ref Range   TSH 83.452 (H) 0.350 - 4.500 uIU/mL  T4, free     Status: Abnormal   Collection Time: 02/26/15  1:13 PM  Result Value Ref Range   Free T4 0.30 (L) 0.80 - 1.80 ng/dL    Comment: Performed at [facility]  T3, free     Status: Abnormal   Collection Time: 02/26/15  1:13 PM  Result Value Ref Range   T3, Free 0.8 (L) 2.0 - 4.4 pg/mL     Comment: (NOTE) Performed At: [facility] [place], [place] [id number] Lindon Romp MD [id number]   Magnesium     Status: None   Collection Time: 02/26/15  1:33 PM  Result Value Ref Range   Magnesium 2.0 1.5 - 2.5 mg/dL  Urine rapid drug screen (hosp performed)     Status: Abnormal   Collection Time: 02/26/15  2:13 PM  Result Value Ref Range   Opiates NONE DETECTED NONE DETECTED   Cocaine NONE DETECTED NONE DETECTED   Benzodiazepines POSITIVE (A) NONE DETECTED   Amphetamines NONE DETECTED NONE DETECTED   Tetrahydrocannabinol NONE DETECTED NONE DETECTED   Barbiturates NONE DETECTED NONE DETECTED    Comment:        DRUG SCREEN FOR MEDICAL PURPOSES ONLY.  IF CONFIRMATION IS NEEDED FOR ANY PURPOSE, NOTIFY LAB WITHIN 5 DAYS.        LOWEST DETECTABLE LIMITS FOR URINE DRUG SCREEN Drug Class       Cutoff (ng/mL) Amphetamine      1000 Barbiturate      200 Benzodiazepine   179 Tricyclics       150 Opiates          300 Cocaine          300 THC              50   Glucose, capillary     Status: Abnormal   Collection Time: 02/26/15  4:58 PM  Result Value Ref Range   Glucose-Capillary 275 (H) 70 - 99 mg/dL  Glucose, capillary     Status: Abnormal   Collection Time: 02/26/15  8:50 PM  Result Value Ref Range   Glucose-Capillary 38 (LL) 70 - 99 mg/dL   Comment 1 Notify RN    Comment 2 Document in Chart   Glucose, capillary     Status: Abnormal   Collection Time: 02/26/15  9:11 PM  Result Value Ref Range   Glucose-Capillary 60 (L) 70 - 99 mg/dL  Glucose, capillary     Status: Abnormal   Collection Time: 02/26/15  9:39 PM  Result Value Ref Range   Glucose-Capillary 175 (H) 70 - 99 mg/dL  Comprehensive metabolic panel     Status: Abnormal   Collection Time: 02/27/15  5:39 AM  Result Value Ref Range   Sodium 132 (L) 135 - 145 mmol/L   Potassium 3.8 3.5 - 5.1 mmol/L    Comment: DELTA CHECK NOTED   Chloride 99 96 - 112 mmol/L   CO2 32 19 - 32 mmol/L    Glucose, Bld 131 (H) 70 - 99 mg/dL   BUN 5 (L) 6 - 23 mg/dL   Creatinine, Ser 0.68 0.50 - 1.10 mg/dL   Calcium 7.7 (L) 8.4 -  10.5 mg/dL   Total Protein 5.8 (L) 6.0 - 8.3 g/dL   Albumin 3.0 (L) 3.5 - 5.2 g/dL   AST 46 (H) 0 - 37 U/L   ALT 12 0 - 35 U/L   Alkaline Phosphatase 84 39 - 117 U/L   Total Bilirubin 0.4 0.3 - 1.2 mg/dL   GFR calc non Af Amer 89 (L) >90 mL/min   GFR calc Af Amer >90 >90 mL/min    Comment: (NOTE) The eGFR has been calculated using the CKD EPI equation. This calculation has not been validated in all clinical situations. eGFR's persistently <90 mL/min signify possible Chronic Kidney Disease.    Anion gap NOT CALCULATED 5 - 15  CBC     Status: Abnormal   Collection Time: 02/27/15  5:39 AM  Result Value Ref Range   WBC 5.8 4.0 - 10.5 K/uL   RBC 3.20 (L) 3.87 - 5.11 MIL/uL   Hemoglobin 10.5 (L) 12.0 - 15.0 g/dL   HCT 31.7 (L) 36.0 - 46.0 %   MCV 99.1 78.0 - 100.0 fL   MCH 32.8 26.0 - 34.0 pg   MCHC 33.1 30.0 - 36.0 g/dL   RDW 12.8 11.5 - 15.5 %   Platelets 207 150 - 400 K/uL  Glucose, capillary     Status: Abnormal   Collection Time: 02/27/15  7:46 AM  Result Value Ref Range   Glucose-Capillary 152 (H) 70 - 99 mg/dL  Glucose, capillary     Status: Abnormal   Collection Time: 02/27/15 11:30 AM  Result Value Ref Range   Glucose-Capillary 64 (L) 70 - 99 mg/dL  Glucose, capillary     Status: Abnormal   Collection Time: 02/27/15  2:53 PM  Result Value Ref Range   Glucose-Capillary 139 (H) 70 - 99 mg/dL   Comment 1 Notify RN   Glucose, capillary     Status: Abnormal   Collection Time: 02/27/15  4:37 PM  Result Value Ref Range   Glucose-Capillary 185 (H) 70 - 99 mg/dL  Glucose, capillary     Status: Abnormal   Collection Time: 02/27/15 10:01 PM  Result Value Ref Range   Glucose-Capillary 253 (H) 70 - 99 mg/dL   Comment 1 Notify RN    Comment 2 Document in Chart   CBC     Status: Abnormal   Collection Time: 02/28/15  7:00 AM  Result Value Ref Range    WBC 5.1 4.0 - 10.5 K/uL   RBC 3.18 (L) 3.87 - 5.11 MIL/uL   Hemoglobin 10.5 (L) 12.0 - 15.0 g/dL   HCT 32.5 (L) 36.0 - 46.0 %   MCV 102.2 (H) 78.0 - 100.0 fL   MCH 33.0 26.0 - 34.0 pg   MCHC 32.3 30.0 - 36.0 g/dL   RDW 13.0 11.5 - 15.5 %   Platelets 184 150 - 400 K/uL  Basic metabolic panel     Status: Abnormal   Collection Time: 02/28/15  7:00 AM  Result Value Ref Range   Sodium 134 (L) 135 - 145 mmol/L   Potassium 4.1 3.5 - 5.1 mmol/L   Chloride 102 96 - 112 mmol/L   CO2 30 19 - 32 mmol/L   Glucose, Bld 252 (H) 70 - 99 mg/dL   BUN 8 6 - 23 mg/dL   Creatinine, Ser 0.66 0.50 - 1.10 mg/dL   Calcium 7.6 (L) 8.4 - 10.5 mg/dL   GFR calc non Af Amer 89 (L) >90 mL/min   GFR calc Af Amer >90 >90  mL/min    Comment: (NOTE) The eGFR has been calculated using the CKD EPI equation. This calculation has not been validated in all clinical situations. eGFR's persistently <90 mL/min signify possible Chronic Kidney Disease.    Anion gap NOT CALCULATED 5 - 15  Glucose, capillary     Status: Abnormal   Collection Time: 02/28/15  7:47 AM  Result Value Ref Range   Glucose-Capillary 237 (H) 70 - 99 mg/dL    Lipid Panel No results for input(s): CHOL, TRIG, HDL, CHOLHDL, VLDL, LDLCALC in the last 72 hours.  Studies/Results:   Medications:  Scheduled Meds: . albuterol  2.5 mg Nebulization Q6H  . alprazolam  2 mg Oral BID  . antiseptic oral rinse  7 mL Mouth Rinse BID  . aspirin  81 mg Oral Daily  . atenolol  50 mg Oral Daily  . chlorzoxazone  500 mg Oral QID  . docusate sodium  100 mg Oral BID  . folic acid  1 mg Oral Daily  . heparin  5,000 Units Subcutaneous 3 times per day  . insulin aspart  0-5 Units Subcutaneous QHS  . insulin aspart  0-9 Units Subcutaneous TID WC  . insulin aspart  3 Units Subcutaneous TID WC  . levothyroxine  50 mcg Oral QAC breakfast  . lisinopril  5 mg Oral Daily  . LORazepam  0-4 mg Intravenous Q6H   Followed by  . LORazepam  0-4 mg Intravenous Q12H  .  multivitamin with minerals  1 tablet Oral Daily  . sertraline  200 mg Oral Daily  . thiamine  100 mg Oral Daily   Or  . thiamine  100 mg Intravenous Daily  . tiotropium  18 mcg Inhalation Daily  . traZODone  300 mg Oral QHS   Continuous Infusions: . sodium chloride 75 mL/hr at 02/27/15 1958   PRN Meds:.LORazepam **OR** LORazepam, ondansetron **OR** ondansetron (ZOFRAN) IV, oxyCODONE-acetaminophen **AND** oxyCODONE     LOS: 2 days   [name] A. Merlene [name], M.D.  Diplomate, Tax adviser of Psychiatry and Neurology ( Neurology).

## 2015-02-28 NOTE — Progress Notes (Signed)
Patients resting room air O2 Sat 84%. O2 at 2 liters by [place] O2 sat 96%.

## 2015-02-28 NOTE — Progress Notes (Addendum)
Inpatient Diabetes Program Recommendations  AACE/ADA: New Consensus Statement on Inpatient Glycemic Control (2013)  Target Ranges:  Prepandial:   less than 140 mg/dL      Peak postprandial:   less than 180 mg/dL (1-2 hours)      Critically ill patients:  140 - 180 mg/dL   Results for [name], [name] (MRN [id number]) as of 02/28/2015 07:56  Ref. Range 02/27/2015 07:46 02/27/2015 11:30 02/27/2015 14:53 02/27/2015 16:37 02/27/2015 22:01 02/28/2015 07:47  Glucose-Capillary Latest Range: 70-99 mg/dL 152 (H) 64 (L) 139 (H) 185 (H) 253 (H) 237 (H)   Diabetes history: DM2 (dx during last hospitalization in January 2016 Outpatient Diabetes medications: Lantus 20 units QHS, Novolog 5 units TID with meals Current orders for Inpatient glycemic control: Novolog 0-9 units TID with meals, Novolog 0-5 units HS, Novolog 3 units TID with meals  Inpatient Diabetes Program Recommendations Insulin - Basal: Please consider ordering Lantus 7 units daily to start now (based on 69 kg x 0.1 units).  Thanks,  Barnie Alderman, RN, MSN, CCRN, CDE Diabetes Coordinator Inpatient Diabetes Program [phone] (Team Pager) [phone] ([facility] office) [phone] [facility] office)

## 2015-02-28 NOTE — Clinical Social Work Note (Signed)
CSW advised patient that she was unable to reach her friend, Harl Favor, as the number was not working. Patient indicated that Ms. Bennye Alm was out of minutes.  Patient advised that she would take PTA. CSW inquired as to if patient could pay for this and patient indicated that she could not.  CSW inquired about her son being a transportation option.  Patient indicated that her son does is a mail carrier and he does not get off work until around eight in the evening.  Patient indicated that she would call [facility] for transport upon discharge.[name]     Ambrose Pancoast, LCSW

## 2015-02-28 NOTE — Clinical Social Work Psychosocial (Signed)
Clinical Social Work Department BRIEF PSYCHOSOCIAL ASSESSMENT 02/28/2015  Patient:  [name], [name]     Account Number:  [id number]     Admit date:  02/26/2015  Clinical Social Worker:  Legrand Como  Date/Time:  02/28/2015 12:02 PM  Referred by:  CSW  Date Referred:  02/28/2015 Referred for  SNF Placement   Other Referral:   Interview type:  Patient Other interview type:    PSYCHOSOCIAL DATA Living Status:  ALONE Admitted from facility:   Level of care:   Primary support name:  [name] Primary support relationship to patient:  FRIEND Degree of support available:   Patient reports that her friend [name] is very supportive.    CURRENT CONCERNS Current Concerns  Post-Acute Placement   Other Concerns:    SOCIAL WORK ASSESSMENT / PLAN CSW met with patient who was oriented.  She was somewhat drowsy.  Patient indicated that she lives alone.  Patient stated that she ambulates with a walker and a wheelchair. She identified her primary support as a friend [name]. She indicated that [name] assist her whenever she needs assistance.  Patient stated that she performs her ADL's independently.  Patient stated that her discharge plan is to go to [facility] and if they have a bed available. She indicated that if [facility] did not have a bed available, that she was going to go home.  Patient indicated that she had been working on going to [facility] prior to her hospitalization.  CSW contacted [facility] and spoke to [name]. [name] indicated that the facility had been doing the paperwork to get patient admitted. She indicated that patient's pay source is going to be Medicaid.  [name] indicated that patient's Medicaid application is still pending and [name] office to complete the FL2.  CSW advised that she could complete the FL2. [name] indicated that even if all patient's paperwork came through, [facility] did not currently have a bed.  [name] indicted that she expected to have a  bed available this month but doubted that she would have a bed available this month.  CSW met with patient and advised of the information obtained from [place] [facility].  Patient indicated that she would go home until a bed was available. CSW asked if patient desired for her to contact her son listed in the chart.  Patient stated that she did not desire for her son to be called. Patient indicated that she wanted her neighbor, [name] to be called and advised that she needed to pick her up upon discharge.   Assessment/plan status:  Information/Referral to [employer] Other assessment/ plan:   Information/referral to community resources:    PATIENT'S/FAMILY'S RESPONSE TO PLAN OF CARE: Patient wants to go home until a bed is available at [facility].  Patient will go home and continue with her current home health agency.    [name], [place]

## 2015-02-28 NOTE — Evaluation (Signed)
Physical Therapy Evaluation Patient Details Name: [name] MRN: [id number] DOB: [birth year] Today's Date: 02/28/2015   History of Present Illness  Patient is a 69 year old woman with history of hypertension, type 2 diabetes, chronic pain syndrome, lung cancer status post radiation therapy who presents to the hospital today after a fall at home. Patient is very confused and not able to give me a good history but per ED notes it appears that a friend or neighbor when by her house today and found her on the floor. She was awake and oriented but they brought her to the hospital. While the nurse was assessing her in the emergency department her extremities became limp and her eyes rolled back and she lost consciousness for about 15 minutes. She was given Ativan as this was perceived to probably have been a seizure. Lab work was notable for a potassium of 2.6, white count of 12.4, chest x-ray without acute abnormalities, CT scan of the head that was limited due to motion but no acute abnormalities identified. We have been asked to admit her for further evaluation and management.  Clinical Impression  Pt was seen for evaluation and found to be alert, oriented and cooperative.  She continues to c/o right ankle pain constantly, 8/10 currently.  She has been anticipating move to ACLF just before her admission to this hospital.  She uses O2 PRN at home and uses a walker for all gait.  She reports that she had not been doing well at home PTA.  She is found to be significantly deconditioned and needed min assist to transfer in and out of bed.  She now has full active ROM and no significant edema of the right ankle and she was encouraged to do ankle pumps frequently during the day.  Her right ankle was wrapped with an Ace wrap prior to gait for increased stability and she felt that this helped.  She needed min assist to ambulate 20' with a walker.  Surprisingly, her gait was not particularly antalgic.  She and I  both agree that she would be most appropriate to discharge to ACLF at d/c. A CAM walker could be used to stabilize her ankle if MD feels appropriate.  I did not pursue this avenue this AM since pt has minimal edema and gait was not antalgic.  They are somewhat bulky to use.  If so desired, CAM walker can be obtained from [employer] on ground floor.     Follow Up Recommendations Home health PT    Equipment Recommendations  None recommended by PT    Recommendations for Other Services   OT    Precautions / Restrictions Precautions Precautions: Fall Restrictions Weight Bearing Restrictions: No      Mobility  Bed Mobility Overal bed mobility: Needs Assistance Bed Mobility: Supine to Sit;Sit to Supine     Supine to sit: Min assist;HOB elevated Sit to supine: Min assist;HOB elevated      Transfers Overall transfer level: Needs assistance Equipment used: Rolling walker (2 wheeled) Transfers: Sit to/from Stand Sit to Stand: Min assist            Ambulation/Gait Ambulation/Gait assistance: Supervision Ambulation Distance (Feet): 20 Feet Assistive device: Rolling walker (2 wheeled) Gait Pattern/deviations: Trunk flexed   Gait velocity interpretation: Below normal speed for age/gender General Gait Details: surprisingly there was not much antalgia noted with weight bearing on the RLE  Stairs            Wheelchair Mobility  Modified Rankin (Stroke Patients Only)       Balance Overall balance assessment: No apparent balance deficits (not formally assessed)                                           Pertinent Vitals/Pain Pain Assessment: 0-10 Pain Score: 8  Pain Descriptors / Indicators: Aching Pain Intervention(s): Limited activity within patient's tolerance;Repositioned (ankle wrapped with Ace wrap for gait)    Home Living Family/patient expects to be discharged to:: Assisted living               Home Equipment: Walker - 2  wheels;Cane - quad;Shower seat      Prior Function Level of Independence: Needs assistance   Gait / Transfers Assistance Needed: Pt uses RW at all times.    ADL's / Homemaking Assistance Needed: neighbor helps with meals and housekeeping and appointment. Per neighbor, son is not reliable.        Hand Dominance   Dominant Hand: Right    Extremity/Trunk Assessment   Upper Extremity Assessment: Generalized weakness           Lower Extremity Assessment: Generalized weakness      Cervical / Trunk Assessment: Kyphotic  Communication   Communication: No difficulties  Cognition Arousal/Alertness: Awake/alert Behavior During Therapy: WFL for tasks assessed/performed Overall Cognitive Status: Within Functional Limits for tasks assessed                      General Comments      Exercises        Assessment/Plan    PT Assessment All further PT needs can be met in the next venue of care  PT Diagnosis Difficulty walking;Abnormality of gait;Generalized weakness;Acute pain   PT Problem List Decreased strength;Decreased activity tolerance;Decreased mobility;Pain  PT Treatment Interventions     PT Goals (Current goals can be found in the Care Plan section) Acute Rehab PT Goals PT Goal Formulation: All assessment and education complete, DC therapy    Frequency     Barriers to discharge  none      Co-evaluation               End of Session Equipment Utilized During Treatment: Gait belt Activity Tolerance: Patient limited by fatigue Patient left: in bed;with call bell/phone within reach;with bed alarm set Nurse Communication: Mobility status         Time: 1470-9295 PT Time Calculation (min) (ACUTE ONLY): 29 min   Charges:   PT Evaluation $Initial PT Evaluation Tier I: 1 Procedure     PT G CodesDemetrios Isaacs L 02/28/2015, 9:53 AM

## 2015-02-28 NOTE — Progress Notes (Signed)
IV removed. Discharge instructions reviewed with patient. Patient verbalized understanding. Portable O2 tank at bedside. Awaiting son for ride home.

## 2015-02-28 NOTE — Discharge Summary (Signed)
Physician Discharge Summary  MARQUETTE PIONTEK YIR:[id number] DOB: October 05, 1946 DOA: 02/26/2015  PCP: [name], MD  Admit date: 02/26/2015 Discharge date: 02/28/2015  Time spent: 40 minutes  Recommendations for Outpatient Follow-up:  1. Follow up with PCP 1-2 weeks for evaluation of medications and functional status.   2. To ALF with HH PT and RN for skilled observation and assessment and strength and endurence 3. 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 Patient was very confused and not able to give a good history but per ED notes it appear that a friend or neighbor went by her house and found her on the floor. She was awake and oriented but they brought her to the hospital. While the nurse was assessing her in the emergency department her extremities became limp and her eyes rolled back and she lost consciousness for about 15 minutes. She was given Ativan as this was perceived to probably have been a seizure. Lab work was notable for a potassium of 2.6, white count of 12.4, chest x-ray without acute abnormalities, CT scan of the head that was limited due to motion but no acute abnormalities identified.   Hospital Course:  Acute encephalopathy/?seizure/?BDZ Withdrawal Likely related to withdrawal of medications as she reports having "run out". UDS + benzos only. Alcohol level  unremarkable. CT head no acute intracranial abnormality. Home ativan resumed at lower dose and she quickly improved. EEG was unremarkable. RPR, vit B 12, homocysteine pending.  Evaluated by neurology who recommended asa 35m.   Hypertension  - controlled. Continue home medications.  Hypothyroidism -TSH 83.6.  T3 0.8  and T4 o.30. Increased synthroi to 77m daily. Will need TSH in 4-6 weeks  Hypokalemia - resolved this am. Magnesium level within limits of normal.   Diabetes type 2  -A1c pending. Hypoglycemia this am. Will hold lantus for now and change SSI to sensative as appetite unreliable. Will decrease meal coverage as well.    Right ankle pain: s/p fall last week. Xray negative for fx.   Procedures:  EEG  Consultations:  neurology  Discharge Exam: Filed Vitals:   02/28/15 1056  BP:   Pulse: 69  Temp:   Resp: 17    General: well nourished NAD Cardiovascular: RRR No MGR No LE edema Respiratory: normal effort BS clear  Discharge Instructions    Current Discharge Medication List    START taking these medications   Details  aspirin 81 MG chewable tablet Chew 1 tablet (81 mg total) by mouth daily.      CONTINUE these medications which have CHANGED   Details  ALPRAZolam (XANAX) 2 MG tablet Take 1 tablet (2 mg total) by mouth 2 (two) times daily. Qty: 30 tablet, Refills: 0    levothyroxine (SYNTHROID) 50 MCG tablet Take 1.5 tablets (75 mcg total) by mouth daily before breakfast. Qty: 30 tablet, Refills: 1  oxyCODONE-acetaminophen (PERCOCET) 10-325 MG per tablet Take 1 tablet by mouth every 6 (six) hours as needed for pain. Qty: 30 tablet, Refills: 0      CONTINUE these medications which have NOT CHANGED   Details  acetaminophen (TYLENOL) 325 MG tablet Take 650 mg by mouth every 6 (six) hours as needed for mild pain or moderate pain (TAKES FOR ARM PAIN).     albuterol (PROVENTIL) (2.5 MG/3ML) 0.083% nebulizer solution Take 2.5 mg by nebulization as  needed for wheezing.    albuterol (VENTOLIN HFA) 108 (90 BASE) MCG/ACT inhaler Inhale 2 puffs into the lungs every 6 (six) hours as needed for wheezing or shortness of breath. Qty: 1 Inhaler, Refills: 0   Associated Diagnoses: Malignant neoplasm of upper lobe of right lung    atenolol (TENORMIN) 50 MG tablet Take 1 tablet (50 mg total) by mouth daily. Qty: 30 tablet, Refills: 0    benzonatate (TESSALON) 100 MG capsule Take 100 mg by mouth 2 (two) times daily as needed for cough.     chlorzoxazone (PARAFON) 500 MG tablet Take 500 mg by mouth 4 (four) times daily.    diphenoxylate-atropine (LOMOTIL) 2.5-0.025 MG per tablet Take 1 tablet by mouth 2 (two) times daily as needed for diarrhea or loose stools.    esomeprazole (NEXIUM) 40 MG capsule Take 40 mg by mouth daily.     fluticasone (FLONASE) 50 MCG/ACT nasal spray Place 1 spray into both nostrils daily as needed for allergies or rhinitis.    insulin aspart (NOVOLOG FLEXPEN) 100 UNIT/ML FlexPen Inject 3 Units into the skin 3 (three) times daily with meals. Qty: 15 mL, Refills: 11    Insulin Glargine (LANTUS SOLOSTAR) 100 UNIT/ML Solostar Pen Inject 20 Units into the skin daily at 10 pm. Qty: 15 mL, Refills: 11    lidocaine (XYLOCAINE) 5 % ointment Apply 1 application topically as needed for mild pain or moderate pain.    lisinopril (PRINIVIL,ZESTRIL) 5 MG tablet Take 1 tablet (5 mg total) by mouth daily. Qty: 30 tablet, Refills: 0    nystatin ointment (MYCOSTATIN) Apply topically 3 (three) times daily. Qty: 30 g, Refills: 0    sertraline (ZOLOFT) 100 MG tablet Take 2 tablets (200 mg total) by mouth daily. Qty: 60 tablet, Refills: 2    tiotropium (SPIRIVA) 18 MCG inhalation capsule Place 1 capsule (18 mcg total) into inhaler and inhale daily. Qty: 30 capsule, Refills: 12    traZODone (DESYREL) 150 MG tablet Take 300 mg by mouth at bedtime.    dextromethorphan-guaiFENesin (MUCINEX DM) 30-600 MG per 12 hr tablet Take 2 tablets  by mouth 2 (two) times daily.    glucose monitoring kit (FREESTYLE) monitoring kit 1 each by Does not apply route 4 (four) times daily - after meals and at bedtime. 1 month Diabetic Testing Supplies for QAC-QHS accuchecks. Qty: 1 each, Refills: 1    Insulin Pen Needle (NOVOFINE) 30G X 8 MM MISC Inject 10 each into the skin 4 (four) times daily. Qty: 150 each, Refills: 6      STOP taking these medications     azithromycin (ZITHROMAX) 250 MG tablet      loperamide (IMODIUM) 2 MG capsule      oxyCODONE (OXY IR/ROXICODONE) 5 MG immediate release tablet      promethazine (PHENERGAN) 25 MG tablet      docusate sodium 100 MG CAPS        Allergies  Allergen Reactions  . Carafate [Sucralfate] Nausea Only  . Prednisone Other (  See Comments)    insomnia  . Nsaids     GI Upset  . Aspirin Other (See Comments)    Gi symptoms. Does NOT take ibuprofen or other NSAIDS  . Aspirin Nausea And Vomiting  . Ibuprofen     Gi upset  . Nsaids Nausea And Vomiting  . Dulera [Mometasone Furo-Formoterol Fum] Palpitations    Panting " my body was twisted inside and out"   Follow-up Information    Follow up with [facility].   Specialty:  Home Health Services   Contact information:   [place] [address] [phone]       Follow up with [name], MD. Schedule an appointment as soon as possible for a visit in 1 week.   Specialty:  Internal Medicine   Why:  evaluate medications and functional status   Contact information:    [place] [address] [phone]        The results of significant diagnostics from this hospitalization (including imaging, microbiology, ancillary and laboratory) are listed below for reference.    Significant Diagnostic Studies: Dg Chest 2 View  02/17/2015   CLINICAL DATA:  Cough for several days.  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 The previously noted right infrahilar nodular opacity is not well seen on this radiographic examination. There is patchy bibasilar atelectasis. Lungs elsewhere clear. Heart size and pulmonary vascularity are normal. No adenopathy. There is thoracolumbar dextroscoliosis. Postoperative change is noted in the proximal right humerus with screw and buttress plate fixation. There is bony remodeling in the proximal humeral region, stable. 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 There is postoperative change in the lower cervical spine.  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 Patchy bibasilar atelectasis. Infrahilar lung nodular lesion noted previously is not well seen on this study.  No change in cardiac silhouette.  No adenopathy appreciable.   Electronically Signed   By: Lowella Grip III M.D.   On: 02/17/2015 16:13   Dg Chest 2 View  02/12/2015   CLINICAL DATA:  Difficulty breathing.  EXAM: CHEST  2 VIEW  COMPARISON:  None.  FINDINGS: Note that the posterior aspect of the chest is not visualized on the lateral view due to difficulties in patient positioning.  There is slight scarring in the left base. There is no edema or consolidation. The heart size and pulmonary vascularity are within normal limits. No adenopathy. There is postoperative change in the proximal right humerus. 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 There is postoperative change in the lower cervical spine.  IMPRESSION: Scarring left base.  No edema or consolidation.   Electronically Signed   By: Lowella Grip III M.D.   On: 02/12/2015 15:35   Dg Ankle 2 Views Right  02/27/2015   CLINICAL DATA:  Pain for 3 days  EXAM: RIGHT ANKLE - 2 VIEW  COMPARISON:  None.  FINDINGS: There is no evidence of fracture, dislocation, or joint effusion. There is no evidence of arthropathy or other focal  bone abnormality. Soft tissues are unremarkable.  IMPRESSION: Negative.   Electronically Signed   By: Kerby Moors M.D.   On: 02/27/2015 15:31   Ct Head Wo Contrast  02/26/2015   CLINICAL  DATA:  69 year old female found down at home after presumed fall. Seizure-like activity witnessed in the emergency room. Cervical spine imaging could not be performed secondary to excessive patient motion.  EXAM: CT HEAD WITHOUT CONTRAST  TECHNIQUE: Contiguous axial images were obtained from the base of the skull through the vertex without intravenous contrast.  COMPARISON:  Prior CT scan of the head 04/27/2014  FINDINGS: Moderate image degradation secondary to excessive motion artifact. Repeat imaging was attempted without significant improvement. Within these limitations, the study is negative for acute intracranial hemorrhage, acute infarction, mass, mass effect, hydrocephalus or midline shift. Gray-white differentiation is preserved throughout. No acute soft tissue or calvarial abnormality. Normal aeration of the bilateral mastoid air cells and visualized paranasal sinuses.  IMPRESSION: No acute intracranial abnormality within the mild limitations of this motion degraded CT scan of the head.   Electronically Signed   By: Jacqulynn Cadet M.D.   On: 02/26/2015 10:04   Dg Chest Portable 1 View  02/26/2015   CLINICAL DATA:  69 year old female status post fall. Altered mental status and combative. Best imaging obtainable.  EXAM: PORTABLE CHEST - 1 VIEW  COMPARISON:  Prior chest x-ray 02/17/2015  FINDINGS: Cardiac and mediastinal contours are grossly unchanged compared to recent prior imaging. No overt pulmonary edema, focal airspace consolidation or pneumothorax. Mild bibasilar atelectasis versus scarring persists. Surgical changes of prior ORIF of a right proximal humerus fracture and left shoulder arthroplasty. No definite acute fracture identified.  IMPRESSION: Limited portable radiograph demonstrates no evidence of  acute cardiopulmonary process or fracture.   Electronically Signed   By: Jacqulynn Cadet M.D.   On: 02/26/2015 09:38    Microbiology: No results found for this or any previous visit (from the past 240 hour(s)).   Labs: Basic Metabolic Panel:  Recent Labs Lab 02/26/15 0856 02/26/15 1333 02/27/15 0539 02/28/15 0700  NA 130*  --  132* 134*  K 2.6*  --  3.8 4.1  CL 92*  --  99 102  CO2 25  --  32 30  GLUCOSE 301*  --  131* 252*  BUN 6  --  5* 8  CREATININE 0.80  --  0.68 0.66  CALCIUM 8.4  --  7.7* 7.6*  MG  --  2.0  --   --    Liver Function Tests:  Recent Labs Lab 02/26/15 0856 02/27/15 0539  AST 56* 46*  ALT 13 12  ALKPHOS 117 84  BILITOT 0.5 0.4  PROT 8.1 5.8*  ALBUMIN 4.4 3.0*   No results for input(s): LIPASE, AMYLASE in the last 168 hours. No results for input(s): AMMONIA in the last 168 hours. CBC:  Recent Labs Lab 02/26/15 0856 02/27/15 0539 02/28/15 0700  WBC 12.4* 5.8 5.1  NEUTROABS 9.5*  --   --   HGB 12.4 10.5* 10.5*  HCT 37.4 31.7* 32.5*  MCV 101.4* 99.1 102.2*  PLT 262 207 184   Cardiac Enzymes:  Recent Labs Lab 02/26/15 0856  TROPONINI <0.03   BNP: BNP (last 3 results)  Recent Labs  02/26/15 0857  BNP 468.0*    ProBNP (last 3 results) No results for input(s): PROBNP in the last 8760 hours.  CBG:  Recent Labs Lab 02/27/15 1453 02/27/15 1637 02/27/15 2201 02/28/15 0747 02/28/15 1100  GLUCAP 139* 185* 253* 237* 300*       Signed:  [facility] 02/28/2015, 12:21 PM

## 2015-03-01 LAB — RPR: RPR Ser Ql: NONREACTIVE

## 2015-03-06 ENCOUNTER — Other Ambulatory Visit: Payer: Self-pay | Admitting: Gynecologic Oncology

## 2015-03-06 DIAGNOSIS — R102 Pelvic and perineal pain: Secondary | ICD-10-CM

## 2015-03-09 LAB — HEMOGLOBIN A1C
Hgb A1c MFr Bld: 9.4 % — ABNORMAL HIGH (ref 4.8–5.6)
Mean Plasma Glucose: 223 mg/dL

## 2015-03-10 ENCOUNTER — Inpatient Hospital Stay (HOSPITAL_COMMUNITY)
Admission: EM | Admit: 2015-03-10 | Discharge: 2015-03-15 | DRG: 638 | Payer: Medicare Other | Attending: Internal Medicine | Admitting: Internal Medicine

## 2015-03-10 ENCOUNTER — Encounter (HOSPITAL_COMMUNITY): Payer: Self-pay

## 2015-03-10 DIAGNOSIS — Z8673 Personal history of transient ischemic attack (TIA), and cerebral infarction without residual deficits: Secondary | ICD-10-CM

## 2015-03-10 DIAGNOSIS — J449 Chronic obstructive pulmonary disease, unspecified: Secondary | ICD-10-CM | POA: Diagnosis present

## 2015-03-10 DIAGNOSIS — F419 Anxiety disorder, unspecified: Secondary | ICD-10-CM | POA: Diagnosis present

## 2015-03-10 DIAGNOSIS — F329 Major depressive disorder, single episode, unspecified: Secondary | ICD-10-CM | POA: Diagnosis present

## 2015-03-10 DIAGNOSIS — Z87891 Personal history of nicotine dependence: Secondary | ICD-10-CM

## 2015-03-10 DIAGNOSIS — E1010 Type 1 diabetes mellitus with ketoacidosis without coma: Secondary | ICD-10-CM | POA: Diagnosis not present

## 2015-03-10 DIAGNOSIS — E876 Hypokalemia: Secondary | ICD-10-CM | POA: Diagnosis present

## 2015-03-10 DIAGNOSIS — Z96612 Presence of left artificial shoulder joint: Secondary | ICD-10-CM | POA: Diagnosis present

## 2015-03-10 DIAGNOSIS — E1310 Other specified diabetes mellitus with ketoacidosis without coma: Secondary | ICD-10-CM | POA: Diagnosis not present

## 2015-03-10 DIAGNOSIS — Z888 Allergy status to other drugs, medicaments and biological substances status: Secondary | ICD-10-CM | POA: Diagnosis not present

## 2015-03-10 DIAGNOSIS — Z886 Allergy status to analgesic agent status: Secondary | ICD-10-CM

## 2015-03-10 DIAGNOSIS — Z923 Personal history of irradiation: Secondary | ICD-10-CM

## 2015-03-10 DIAGNOSIS — C3430 Malignant neoplasm of lower lobe, unspecified bronchus or lung: Secondary | ICD-10-CM

## 2015-03-10 DIAGNOSIS — E0810 Diabetes mellitus due to underlying condition with ketoacidosis without coma: Secondary | ICD-10-CM

## 2015-03-10 DIAGNOSIS — I10 Essential (primary) hypertension: Secondary | ICD-10-CM

## 2015-03-10 DIAGNOSIS — Z794 Long term (current) use of insulin: Secondary | ICD-10-CM

## 2015-03-10 DIAGNOSIS — R739 Hyperglycemia, unspecified: Secondary | ICD-10-CM | POA: Diagnosis not present

## 2015-03-10 DIAGNOSIS — C3411 Malignant neoplasm of upper lobe, right bronchus or lung: Secondary | ICD-10-CM

## 2015-03-10 DIAGNOSIS — F4310 Post-traumatic stress disorder, unspecified: Secondary | ICD-10-CM | POA: Diagnosis present

## 2015-03-10 DIAGNOSIS — C3490 Malignant neoplasm of unspecified part of unspecified bronchus or lung: Secondary | ICD-10-CM | POA: Diagnosis present

## 2015-03-10 DIAGNOSIS — Z7982 Long term (current) use of aspirin: Secondary | ICD-10-CM

## 2015-03-10 DIAGNOSIS — E1110 Type 2 diabetes mellitus with ketoacidosis without coma: Secondary | ICD-10-CM | POA: Diagnosis present

## 2015-03-10 DIAGNOSIS — M1990 Unspecified osteoarthritis, unspecified site: Secondary | ICD-10-CM | POA: Diagnosis present

## 2015-03-10 LAB — CBC WITH DIFFERENTIAL/PLATELET
Basophils Absolute: 0 10*3/uL (ref 0.0–0.1)
Basophils Relative: 0 % (ref 0–1)
Eosinophils Absolute: 0 10*3/uL (ref 0.0–0.7)
Eosinophils Relative: 0 % (ref 0–5)
HEMATOCRIT: 32.8 % — AB (ref 36.0–46.0)
HEMOGLOBIN: 10.9 g/dL — AB (ref 12.0–15.0)
LYMPHS ABS: 0.7 10*3/uL (ref 0.7–4.0)
LYMPHS PCT: 11 % — AB (ref 12–46)
MCH: 32.9 pg (ref 26.0–34.0)
MCHC: 33.2 g/dL (ref 30.0–36.0)
MCV: 99.1 fL (ref 78.0–100.0)
Monocytes Absolute: 0.3 10*3/uL (ref 0.1–1.0)
Monocytes Relative: 4 % (ref 3–12)
NEUTROS ABS: 5.3 10*3/uL (ref 1.7–7.7)
Neutrophils Relative %: 85 % — ABNORMAL HIGH (ref 43–77)
Platelets: 276 10*3/uL (ref 150–400)
RBC: 3.31 MIL/uL — ABNORMAL LOW (ref 3.87–5.11)
RDW: 12.4 % (ref 11.5–15.5)
WBC: 6.2 10*3/uL (ref 4.0–10.5)

## 2015-03-10 LAB — BASIC METABOLIC PANEL
Anion gap: 20 — ABNORMAL HIGH (ref 5–15)
Anion gap: 14 (ref 5–15)
Anion gap: 9 (ref 5–15)
BUN: 26 mg/dL — AB (ref 6–23)
BUN: 23 mg/dL (ref 6–23)
BUN: 19 mg/dL (ref 6–23)
CALCIUM: 8.1 mg/dL — AB (ref 8.4–10.5)
CHLORIDE: 90 mmol/L — AB (ref 96–112)
CO2: 19 mmol/L (ref 19–32)
CO2: 22 mmol/L (ref 19–32)
CO2: 26 mmol/L (ref 19–32)
Calcium: 8.4 mg/dL (ref 8.4–10.5)
Calcium: 8.4 mg/dL (ref 8.4–10.5)
Chloride: 97 mmol/L (ref 96–112)
Chloride: 100 mmol/L (ref 96–112)
Creatinine, Ser: 1.3 mg/dL — ABNORMAL HIGH (ref 0.50–1.10)
Creatinine, Ser: 1.23 mg/dL — ABNORMAL HIGH (ref 0.50–1.10)
Creatinine, Ser: 1.03 mg/dL (ref 0.50–1.10)
GFR calc Af Amer: 48 mL/min — ABNORMAL LOW (ref >90)
GFR calc Af Amer: 51 mL/min — ABNORMAL LOW (ref >90)
GFR calc Af Amer: 64 mL/min — ABNORMAL LOW (ref >90)
GFR, EST NON AFRICAN AMERICAN: 41 mL/min — AB (ref >90)
GFR, EST NON AFRICAN AMERICAN: 44 mL/min — AB (ref >90)
GFR, EST NON AFRICAN AMERICAN: 55 mL/min — AB (ref >90)
GLUCOSE: 616 mg/dL — AB (ref 70–99)
GLUCOSE: 317 mg/dL — AB (ref 70–99)
Glucose, Bld: 157 mg/dL — ABNORMAL HIGH (ref 70–99)
POTASSIUM: 3.4 mmol/L — AB (ref 3.5–5.1)
POTASSIUM: 3.6 mmol/L (ref 3.5–5.1)
Potassium: 4.9 mmol/L (ref 3.5–5.1)
SODIUM: 129 mmol/L — AB (ref 135–145)
Sodium: 133 mmol/L — ABNORMAL LOW (ref 135–145)
Sodium: 135 mmol/L (ref 135–145)

## 2015-03-10 LAB — CBG MONITORING, ED
GLUCOSE-CAPILLARY: 443 mg/dL — AB (ref 70–99)
Glucose-Capillary: >600 mg/dL (ref 70–99)
Glucose-Capillary: >600 mg/dL (ref 70–99)
Glucose-Capillary: 555 mg/dL (ref 70–99)

## 2015-03-10 LAB — BLOOD GAS, ARTERIAL
ACID-BASE DEFICIT: 8.8 mmol/L — AB (ref 0.0–2.0)
Bicarbonate: 15.9 mEq/L — ABNORMAL LOW (ref 20.0–24.0)
Drawn by: 27407
FIO2: 0.21 %
O2 Saturation: 94.2 %
PCO2 ART: 30.6 mmHg — AB (ref 35.0–45.0)
PH ART: 7.334 — AB (ref 7.350–7.450)
PO2 ART: 80.9 mmHg (ref 80.0–100.0)
Patient temperature: 37
TCO2: 14.7 mmol/L (ref 0–100)

## 2015-03-10 MED ORDER — SODIUM CHLORIDE 0.9 % IV SOLN
INTRAVENOUS | Status: DC
  Administered 2015-03-10: 18:00:00 via INTRAVENOUS
  Filled 2015-03-10: qty 2.5

## 2015-03-10 MED ORDER — SODIUM CHLORIDE 0.9 % IV SOLN
INTRAVENOUS | Status: DC
  Administered 2015-03-10: 14:00:00 via INTRAVENOUS

## 2015-03-10 MED ORDER — ASPIRIN 81 MG PO CHEW
81.0000 mg | CHEWABLE_TABLET | Freq: Every day | ORAL | Status: DC
  Administered 2015-03-10 – 2015-03-14 (×3): 81 mg via ORAL
  Filled 2015-03-10 (×6): qty 1

## 2015-03-10 MED ORDER — SODIUM CHLORIDE 0.9 % IV SOLN: INTRAVENOUS | Status: DC

## 2015-03-10 MED ORDER — DEXTROSE-NACL 5-0.45 % IV SOLN: INTRAVENOUS | Status: DC

## 2015-03-10 MED ORDER — OXYCODONE HCL 5 MG PO TABS
5.0000 mg | ORAL_TABLET | Freq: Four times a day (QID) | ORAL | Status: DC | PRN
  Administered 2015-03-10 – 2015-03-15 (×9): 5 mg via ORAL
  Filled 2015-03-10 (×10): qty 1

## 2015-03-10 MED ORDER — OXYCODONE-ACETAMINOPHEN 5-325 MG PO TABS
1.0000 | ORAL_TABLET | Freq: Four times a day (QID) | ORAL | Status: DC | PRN
Start: 2015-03-10 — End: 2015-03-15
  Administered 2015-03-10 – 2015-03-15 (×11): 1 via ORAL
  Filled 2015-03-10 (×12): qty 1

## 2015-03-10 MED ORDER — ACETAMINOPHEN 325 MG PO TABS
650.0000 mg | ORAL_TABLET | Freq: Four times a day (QID) | ORAL | Status: DC | PRN
  Administered 2015-03-15: 650 mg via ORAL
  Filled 2015-03-10: qty 2

## 2015-03-10 MED ORDER — ALBUTEROL SULFATE (2.5 MG/3ML) 0.083% IN NEBU
2.5000 mg | INHALATION_SOLUTION | RESPIRATORY_TRACT | Status: DC | PRN
  Administered 2015-03-10: 2.5 mg via RESPIRATORY_TRACT
  Filled 2015-03-10: qty 3

## 2015-03-10 MED ORDER — OXYCODONE-ACETAMINOPHEN 10-325 MG PO TABS: 1.0000 | ORAL_TABLET | Freq: Four times a day (QID) | ORAL | Status: DC | PRN

## 2015-03-10 MED ORDER — CHLORZOXAZONE 500 MG PO TABS
500.0000 mg | ORAL_TABLET | Freq: Four times a day (QID) | ORAL | Status: DC
  Administered 2015-03-10 – 2015-03-15 (×18): 500 mg via ORAL
  Filled 2015-03-10 (×27): qty 1

## 2015-03-10 MED ORDER — INSULIN GLARGINE 100 UNIT/ML ~~LOC~~ SOLN
20.0000 [IU] | Freq: Every day | SUBCUTANEOUS | Status: DC
  Administered 2015-03-11 – 2015-03-15 (×6): 20 [IU] via SUBCUTANEOUS
  Filled 2015-03-10 (×8): qty 0.2

## 2015-03-10 MED ORDER — ALPRAZOLAM 1 MG PO TABS
2.0000 mg | ORAL_TABLET | Freq: Two times a day (BID) | ORAL | Status: DC
  Administered 2015-03-10 – 2015-03-15 (×10): 2 mg via ORAL
  Filled 2015-03-10 (×2): qty 2
  Filled 2015-03-10 (×3): qty 4
  Filled 2015-03-10 (×6): qty 2

## 2015-03-10 MED ORDER — ENOXAPARIN SODIUM 40 MG/0.4ML ~~LOC~~ SOLN
40.0000 mg | SUBCUTANEOUS | Status: DC
  Administered 2015-03-10 – 2015-03-14 (×6): 40 mg via SUBCUTANEOUS
  Filled 2015-03-10 (×6): qty 0.4

## 2015-03-10 MED ORDER — SODIUM CHLORIDE 0.9 % IV SOLN
INTRAVENOUS | Status: DC
  Administered 2015-03-10: 5.4 [IU]/h via INTRAVENOUS
  Filled 2015-03-10: qty 2.5

## 2015-03-10 MED ORDER — LISINOPRIL 5 MG PO TABS
5.0000 mg | ORAL_TABLET | Freq: Every day | ORAL | Status: DC
  Administered 2015-03-11 – 2015-03-15 (×5): 5 mg via ORAL
  Filled 2015-03-10 (×6): qty 1

## 2015-03-10 MED ORDER — TIOTROPIUM BROMIDE MONOHYDRATE 18 MCG IN CAPS
18.0000 ug | ORAL_CAPSULE | Freq: Every day | RESPIRATORY_TRACT | Status: DC
  Administered 2015-03-12 – 2015-03-15 (×4): 18 ug via RESPIRATORY_TRACT
  Filled 2015-03-10: qty 5

## 2015-03-10 MED ORDER — ATENOLOL 25 MG PO TABS
50.0000 mg | ORAL_TABLET | Freq: Every day | ORAL | Status: DC
  Administered 2015-03-11 – 2015-03-15 (×5): 50 mg via ORAL
  Filled 2015-03-10 (×6): qty 2

## 2015-03-10 MED ORDER — SODIUM CHLORIDE 0.9 % IV BOLUS (SEPSIS)
1000.0000 mL | Freq: Once | INTRAVENOUS | Status: AC
  Administered 2015-03-10: 1000 mL via INTRAVENOUS

## 2015-03-10 MED ORDER — TRAZODONE HCL 50 MG PO TABS
300.0000 mg | ORAL_TABLET | Freq: Every day | ORAL | Status: DC
  Administered 2015-03-10 – 2015-03-14 (×5): 300 mg via ORAL
  Filled 2015-03-10 (×5): qty 6

## 2015-03-10 MED ORDER — DEXTROSE-NACL 5-0.45 % IV SOLN
INTRAVENOUS | Status: DC
  Administered 2015-03-10: 20:00:00 via INTRAVENOUS

## 2015-03-10 MED ORDER — INSULIN REGULAR BOLUS VIA INFUSION
0.0000 [IU] | Freq: Three times a day (TID) | INTRAVENOUS | Status: DC
Start: 2015-03-10 — End: 2015-03-10
  Filled 2015-03-10: qty 10

## 2015-03-10 MED ORDER — LEVOTHYROXINE SODIUM 75 MCG PO TABS
75.0000 ug | ORAL_TABLET | Freq: Every day | ORAL | Status: DC
  Administered 2015-03-11 – 2015-03-15 (×5): 75 ug via ORAL
  Filled 2015-03-10 (×5): qty 1

## 2015-03-10 MED ORDER — SERTRALINE HCL 50 MG PO TABS
200.0000 mg | ORAL_TABLET | Freq: Every day | ORAL | Status: DC
  Administered 2015-03-11 – 2015-03-15 (×5): 200 mg via ORAL
  Filled 2015-03-10 (×6): qty 4

## 2015-03-10 MED ORDER — DEXTROSE 50 % IV SOLN: 25.0000 mL | INTRAVENOUS | Status: DC | PRN

## 2015-03-10 MED ORDER — SODIUM CHLORIDE 0.9 % IV SOLN
INTRAVENOUS | Status: DC
  Administered 2015-03-11 – 2015-03-13 (×3): via INTRAVENOUS

## 2015-03-10 MED ORDER — ONDANSETRON HCL 4 MG/2ML IJ SOLN
4.0000 mg | Freq: Four times a day (QID) | INTRAMUSCULAR | Status: DC | PRN
  Administered 2015-03-13: 4 mg via INTRAVENOUS
  Filled 2015-03-10: qty 2

## 2015-03-10 MED ORDER — POTASSIUM CHLORIDE 10 MEQ/100ML IV SOLN
10.0000 meq | INTRAVENOUS | Status: AC
  Administered 2015-03-10 (×2): 10 meq via INTRAVENOUS
  Filled 2015-03-10: qty 100

## 2015-03-10 MED ORDER — SODIUM CHLORIDE 0.9 % IV SOLN
INTRAVENOUS | Status: AC
  Administered 2015-03-10: 18:00:00 via INTRAVENOUS

## 2015-03-10 MED ORDER — PANTOPRAZOLE SODIUM 40 MG PO TBEC
80.0000 mg | DELAYED_RELEASE_TABLET | Freq: Every day | ORAL | Status: DC
  Administered 2015-03-11 – 2015-03-15 (×5): 80 mg via ORAL
  Filled 2015-03-10 (×6): qty 2

## 2015-03-10 MED ORDER — CHLORZOXAZONE 500 MG PO TABS
ORAL_TABLET | ORAL | Status: AC
  Filled 2015-03-10: qty 1

## 2015-03-10 NOTE — ED Provider Notes (Signed)
CSN: [id number]     Arrival date & time 03/10/15  1119 History   First MD Initiated Contact with Patient 03/10/15 1237     Chief Complaint  Patient presents with  . Hyperglycemia      Patient is a 69 y.o. female presenting with hyperglycemia. The history is provided by the EMS personnel, a relative and the patient. The history is limited by the condition of the patient (AMS).  Hyperglycemia Pt was seen at 1315. Per EMS, pt's son, and pt report: EMS called out for "high blood sugar." [name] son states pt's Home Health RN that pt's insulin pens had the needles bent at a 90 degree angle and the one pen did not have a needle at all. He believes pt has likely not taken her insulin for an unknown period of time. Pt initially stated to me that she took her insulin this morning, but then said, "I don't know."  Denies N/V/D, no abd pain, no CP/SOB.   Past Medical History  Diagnosis Date  . H/O: stroke 2012, 2000,     X3  . Anxiety   . PTSD (post-traumatic stress disorder)   . Depression   . SVD (spontaneous vaginal delivery)     x 1  . Smoker   . Osteoarthritis     knees, hips hands,   . Shortness of breath   . Pain     SEVERE PAIN BACK, RIGHT HIP AND KNEES--HARRINGTON RODS AND CERVICAL PLATES--USES WALKER OR CANE WHEN AMBULATING - GOES TO A PAIN CLINIC-STATES SHE NEEDS RT HIP AND BOTH KNEES REPLACED. PT STATES SHE WAS TOLD THE CEMENT AROUND THE HARRINGTON RODS IS CRACKED.  [name] Hot flashes, menopausal     SEVERE  . Complication of anesthesia 03/02/13    severe headache,vertigo postop  . COPD (chronic obstructive pulmonary disease)     stable  . 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 Lung cancer   . Status post radiation therapy within four to twelve weeks 10/18/13-12/06/13    lung ca  66Gy  . Family history of anesthesia complication     equipment failure caused problem, suffered a stroke & heartattack  . Diabetes mellitus, type II   . Diabetes mellitus without complication   . Cancer   .  Hypertension   . COPD (chronic obstructive pulmonary disease)    Past Surgical History  Procedure Laterality Date  . Carpal tunnel release      BILATERAL  . Nasal sinus surgery    . Lumbar fusion      L4-S1  . Cervical fusion    . Hernia repair    . Salpingoophorectomy Right   . Vaginal hysterectomy      TAH w/ ovary removal  . Transthoracic echocardiogram  12-03-2011    LVSF NORMAL/ EF 60-65%  . Vulvectomy N/A 03/02/2013    Procedure: WIDE LOCAL EXCISION VULVAR;  Surgeon: Imagene Gurney A. Alycia Rossetti, MD;  Location: [facility];  Service: Gynecology;  Laterality: N/A;  . Co2 laser application N/A 3/41/9622    Procedure: LASER APPLICATION OF THE VULVA;  Surgeon: Janie Morning, MD;  Location: [facility];  Service: Gynecology;  Laterality: N/A;  . Video bronchoscopy Bilateral 09/23/2013    Procedure: VIDEO BRONCHOSCOPY WITHOUT FLUORO;  Surgeon: Tanda Rockers, MD;  Location: [facility];  Service: Cardiopulmonary;  Laterality: Bilateral;  . Hemiarthroplasty shoulder fracture Left 05/25/2014    DR CHANDLER  . Reverse shoulder arthroplasty Left 05/24/2014    Procedure:  Hemi-Arthroplasty  Left Shoulder;  Surgeon: Nita Sells, MD;  Location: [facility];  Service: Orthopedics;  Laterality: Left;  . Orif humerus fracture Right 12/12/2014    Procedure: OPEN REDUCTION INTERNAL FIXATION (ORIF) PROXIMAL HUMERUS FRACTURE;  Surgeon: Nita Sells, MD;  Location: [facility];  Service: Orthopedics;  Laterality: Right;   Family History  Problem Relation Age of Onset  . Cancer Mother     COLON  . Depression Mother   . Heart disease Father   . Diabetes Maternal Aunt   . Cancer Maternal Grandfather     KIDNEY   . Diabetes Maternal Grandfather   . Hypertension Maternal Grandfather   . Heart disease Maternal Grandfather   . Drug abuse Sister    History  Substance Use Topics  . Smoking status: Never Smoker   . Smokeless tobacco: Not on file  . Alcohol Use: No   OB History     Gravida Para Term Preterm AB TAB SAB Ectopic Multiple Living   2 1 0 0 1 0 0 0       Review of Systems  Unable to perform ROS: Mental status change      Allergies  Carafate; Prednisone; Nsaids; Aspirin; Aspirin; Ibuprofen; Nsaids; and Dulera  Home Medications   Prior to Admission medications   Medication Sig Start Date End Date Taking? Authorizing Provider  acetaminophen (TYLENOL) 325 MG tablet Take 650 mg by mouth every 6 (six) hours as needed for mild pain or moderate pain (TAKES FOR ARM PAIN).    Yes Historical Provider, MD  albuterol (PROVENTIL) (2.5 MG/3ML) 0.083% nebulizer solution Take 2.5 mg by nebulization as needed for wheezing.   Yes Historical Provider, MD  albuterol (VENTOLIN HFA) 108 (90 BASE) MCG/ACT inhaler Inhale 2 puffs into the lungs every 6 (six) hours as needed for wheezing or shortness of breath. 11/10/14  Yes Curt Bears, MD  ALPRAZolam Duanne Moron) 2 MG tablet Take 1 tablet (2 mg total) by mouth 2 (two) times daily. 02/28/15  Yes Radene Gunning, NP  aspirin 81 MG chewable tablet Chew 1 tablet (81 mg total) by mouth daily. 02/28/15  Yes Lezlie Octave Black, NP  atenolol (TENORMIN) 50 MG tablet Take 1 tablet (50 mg total) by mouth daily. 11/25/13  Yes Velvet Bathe, MD  benzonatate (TESSALON) 100 MG capsule Take 100 mg by mouth 2 (two) times daily as needed for cough.    Yes Historical Provider, MD  chlorzoxazone (PARAFON) 500 MG tablet Take 500 mg by mouth 4 (four) times daily.   Yes Historical Provider, MD  dextromethorphan-guaiFENesin (MUCINEX DM) 30-600 MG per 12 hr tablet Take 2 tablets by mouth 2 (two) times daily.   Yes Historical Provider, MD  diphenoxylate-atropine (LOMOTIL) 2.5-0.025 MG per tablet Take 1 tablet by mouth 2 (two) times daily as needed for diarrhea or loose stools.   Yes Historical Provider, MD  esomeprazole (NEXIUM) 40 MG capsule Take 40 mg by mouth daily.    Yes Historical Provider, MD  fluticasone (FLONASE) 50 MCG/ACT nasal spray Place 1 spray into both  nostrils daily as needed for allergies or rhinitis.   Yes Historical Provider, MD  insulin aspart (NOVOLOG FLEXPEN) 100 UNIT/ML FlexPen Inject 3 Units into the skin 3 (three) times daily with meals. Patient taking differently: Inject 5 Units into the skin 3 (three) times daily with meals.  01/11/15  Yes Debbe Odea, MD  Insulin Glargine (LANTUS SOLOSTAR) 100 UNIT/ML Solostar Pen Inject 20 Units into the skin daily at 10 pm.  01/11/15  Yes Debbe Odea, MD  levothyroxine (SYNTHROID) 50 MCG tablet Take 1.5 tablets (75 mcg total) by mouth daily before breakfast. 02/28/15  Yes Lezlie Octave Black, NP  lidocaine (XYLOCAINE) 2 % jelly APPLY TO AFFECTED AREA DAILY AS NEEDED. 03/07/15  Yes Melissa D Cross, NP  lisinopril (PRINIVIL,ZESTRIL) 5 MG tablet Take 1 tablet (5 mg total) by mouth daily. 01/11/15  Yes Debbe Odea, MD  nystatin ointment (MYCOSTATIN) Apply topically 3 (three) times daily. 01/11/15  Yes Debbe Odea, MD  oxyCODONE-acetaminophen (PERCOCET) 10-325 MG per tablet Take 1 tablet by mouth every 6 (six) hours as needed for pain. 02/28/15  Yes Lezlie Octave Black, NP  sertraline (ZOLOFT) 100 MG tablet Take 2 tablets (200 mg total) by mouth daily. 01/23/15  Yes Cloria Spring, MD  tiotropium (SPIRIVA) 18 MCG inhalation capsule Place 1 capsule (18 mcg total) into inhaler and inhale daily. 12/14/14  Yes Debbe Odea, MD  traZODone (DESYREL) 150 MG tablet Take 300 mg by mouth at bedtime.   Yes Historical Provider, MD   BP 100/86 mmHg  Pulse 87  Temp(Src) 98.2 F (36.8 C)  Resp 23  SpO2 98%   Filed Vitals:   03/10/15 1123 03/10/15 1335 03/10/15 1400 03/10/15 1402  BP: 132/66 133/60  100/86  Pulse: 73 81 84 87  Temp: 98.2 F (36.8 C)     Resp: 18 19 27 23   SpO2: 100% 91% 97% 98%    Physical Exam  1320: Physical examination:  Nursing notes reviewed; Vital signs and O2 SAT reviewed;  Constitutional: Well developed, Well nourished, In no acute distress; Head:  Normocephalic, atraumatic; Eyes: EOMI, PERRL, No  scleral icterus; ENMT: Mouth and pharynx normal, Mucous membranes dry; Neck: Supple, Full range of motion, No lymphadenopathy; Cardiovascular: Regular rate and rhythm, No gallop; Respiratory: Breath sounds coarse & equal bilaterally, No wheezes.  Speaking full sentences with ease, Normal respiratory effort/excursion; Chest: Nontender, Movement normal; Abdomen: Soft, Nontender, Nondistended, Normal bowel sounds; Genitourinary: No CVA tenderness; Extremities: Pulses normal, No tenderness, No edema, No calf edema or asymmetry.; Neuro: Awake, alert, confused re: time, events. Major CN grossly intact. No facial droop. Speech clear. Moves all extremities spontaneously..; Skin: Color normal, Warm, Dry.   ED Course  Procedures     EKG Interpretation   Date/Time:  Friday March 10 2015 13:33:37 EST Ventricular Rate:  79 PR Interval:  161 QRS Duration: 94 QT Interval:  404 QTC Calculation: 463 R Axis:   71 Text Interpretation:  Sinus rhythm When compared with ECG of 02/26/2015 QT  has shortened Confirmed by [facility]  MD, Nunzio Cory [phone]) on 03/10/2015  2:00:09 PM      MDM  MDM Reviewed: previous chart, nursing note and vitals Reviewed previous: labs and ECG Interpretation: labs and ECG Total time providing critical care: 30-74 minutes. This excludes time spent performing separately reportable procedures and services. Consults: admitting MD     CRITICAL CARE Performed by: Alfonzo Feller Total critical care time: 35 Critical care time was exclusive of separately billable procedures and treating other patients. Critical care was necessary to treat or prevent imminent or life-threatening deterioration. Critical care was time spent personally by me on the following activities: development of treatment plan with patient and/or surrogate as well as nursing, discussions with consultants, evaluation of patient's response to treatment, examination of patient, obtaining history from patient or  surrogate, ordering and performing treatments and interventions, ordering and review of laboratory studies, ordering and review of radiographic studies, pulse oximetry and re-evaluation  of patient's condition.   Results for orders placed or performed during the hospital encounter of 03/10/15  CBC with Differential  Result Value Ref Range   WBC 6.2 4.0 - 10.5 K/uL   RBC 3.31 (L) 3.87 - 5.11 MIL/uL   Hemoglobin 10.9 (L) 12.0 - 15.0 g/dL   HCT 32.8 (L) 36.0 - 46.0 %   MCV 99.1 78.0 - 100.0 fL   MCH 32.9 26.0 - 34.0 pg   MCHC 33.2 30.0 - 36.0 g/dL   RDW 12.4 11.5 - 15.5 %   Platelets 276 150 - 400 K/uL   Neutrophils Relative % 85 (H) 43 - 77 %   Neutro Abs 5.3 1.7 - 7.7 K/uL   Lymphocytes Relative 11 (L) 12 - 46 %   Lymphs Abs 0.7 0.7 - 4.0 K/uL   Monocytes Relative 4 3 - 12 %   Monocytes Absolute 0.3 0.1 - 1.0 K/uL   Eosinophils Relative 0 0 - 5 %   Eosinophils Absolute 0.0 0.0 - 0.7 K/uL   Basophils Relative 0 0 - 1 %   Basophils Absolute 0.0 0.0 - 0.1 K/uL  Basic metabolic panel  Result Value Ref Range   Sodium 129 (L) 135 - 145 mmol/L   Potassium 4.9 3.5 - 5.1 mmol/L   Chloride 90 (L) 96 - 112 mmol/L   CO2 19 19 - 32 mmol/L   Glucose, Bld 616 (HH) 70 - 99 mg/dL   BUN 26 (H) 6 - 23 mg/dL   Creatinine, Ser 1.30 (H) 0.50 - 1.10 mg/dL   Calcium 8.4 8.4 - 10.5 mg/dL   GFR calc non Af Amer 41 (L) >90 mL/min   GFR calc Af Amer 48 (L) >90 mL/min   Anion gap 20 (H) 5 - 15  Blood gas, arterial ([facility] ONLY)  Result Value Ref Range   FIO2 0.21 %   Delivery systems ROOM AIR    pH, Arterial 7.334 (L) 7.350 - 7.450   pCO2 arterial 30.6 (L) 35.0 - 45.0 mmHg   pO2, Arterial 80.9 80.0 - 100.0 mmHg   Bicarbonate 15.9 (L) 20.0 - 24.0 mEq/L   TCO2 14.7 0 - 100 mmol/L   Acid-base deficit 8.8 (H) 0.0 - 2.0 mmol/L   O2 Saturation 94.2 %   Patient temperature 37.0    Collection site RIGHT BRACHIAL    Drawn by 13244    Sample type ARTERIAL DRAW    Allens test (pass/fail) PASS PASS  POC  CBG, ED  Result Value Ref Range   Glucose-Capillary >600 (HH) 70 - 99 mg/dL  CBG monitoring, ED  Result Value Ref Range   Glucose-Capillary >600 (HH) 70 - 99 mg/dL  CBG monitoring, ED  Result Value Ref Range   Glucose-Capillary 555 (HH) 70 - 99 mg/dL    1335:  CBG elevated, AG 20. IVF NS bolus started as well as IV insulin gtt. Na corrects to 141 for elevated glucose. VS remain stable. Pt remains awake/alert, resps easy. T/C to [facility] Dr. Jerilee Hoh, case discussed, including:  HPI, pertinent PM/SHx, VS/PE, dx testing, ED course and treatment:  Agreeable to admit, requests to write temporary orders, obtain stepdown bed to team AP1.      Francine Graven, DO 03/12/15 1829

## 2015-03-10 NOTE — ED Notes (Signed)
Awaiting insulin drip from pharmacy.

## 2015-03-10 NOTE — ED Notes (Signed)
Per EMS, called out for patient having high blood sugar.

## 2015-03-10 NOTE — ED Notes (Signed)
Per son, patient's [facility] nurse told him that her insulin pens had the needles bent at a 90 degree angle and that one pen did not have a needle in it at all so it is likely she has not had her medication for an unknown amount of time.

## 2015-03-10 NOTE — ED Notes (Signed)
Patient's home health nurse, Lexine Baton, states patient cannot manage her home medications. It has been approved through home health that patient has been accepted and has a bed at [facility]. Lexine Baton states she cannot be admitted over the weekend, so bed will be available Monday.

## 2015-03-10 NOTE — H&P (Signed)
[facility]          History and Physical    PCP:   [name], MD   Chief Complaint:  High sugar  HPI: Patient was sent to the ED today after her Cyril norse noted her CBGs were elevated. It appears her insulin pen needles were bent and she was likely not getting any insulin. 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 She chronically takes a large amount of xanax and oxycodone. All she tells [place] is she has leg pain/cramps and needs pain medication and xanax. She was noted to be in DKA with a CBG above 600 and was referred for admission.  Allergies:   Allergies  Allergen Reactions  . Carafate [Sucralfate] Nausea Only  . Prednisone Other (See Comments)    insomnia  . Nsaids     GI Upset  . Aspirin Other (See Comments)    Gi symptoms. Does NOT take ibuprofen or other NSAIDS  . Aspirin Nausea And Vomiting  . Ibuprofen     Gi upset  . Nsaids Nausea And Vomiting  . Dulera [Mometasone Furo-Formoterol Fum] Palpitations    Panting " my body was twisted inside and out"      Past Medical History  Diagnosis Date  . H/O: stroke 2012, 2000,     X3  . Anxiety   . PTSD (post-traumatic stress disorder)   . Depression   . SVD (spontaneous vaginal delivery)     x 1  . Smoker   . Osteoarthritis     knees, hips hands,   . Shortness of breath   . Pain     SEVERE PAIN BACK, RIGHT HIP AND KNEES--HARRINGTON RODS AND CERVICAL PLATES--USES WALKER OR CANE WHEN AMBULATING - GOES TO A PAIN CLINIC-STATES SHE NEEDS RT HIP AND BOTH KNEES REPLACED. PT STATES SHE WAS TOLD THE CEMENT AROUND THE HARRINGTON RODS IS CRACKED.  [name] Hot flashes, menopausal     SEVERE  . Complication of anesthesia 03/02/13    severe headache,vertigo postop  . COPD (chronic obstructive pulmonary disease)     stable  . 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 Lung cancer   . Status post radiation therapy within four to twelve weeks 10/18/13-12/06/13    lung ca  66Gy  . Family history of anesthesia  complication     equipment failure caused problem, suffered a stroke & heartattack  . Diabetes mellitus, type II   . Diabetes mellitus without complication   . Cancer   . Hypertension   . COPD (chronic obstructive pulmonary disease)     Past Surgical History  Procedure Laterality Date  . Carpal tunnel release      BILATERAL  . Nasal sinus surgery    . Lumbar fusion      L4-S1  . Cervical fusion    . Hernia repair    . Salpingoophorectomy Right   . Vaginal hysterectomy      TAH w/ ovary removal  . Transthoracic echocardiogram  12-03-2011    LVSF NORMAL/ EF 60-65%  . Vulvectomy N/A 03/02/2013    Procedure: WIDE LOCAL EXCISION VULVAR;  Surgeon: Imagene Gurney A. Alycia Rossetti, MD;  Location: [facility];  Service: Gynecology;  Laterality: N/A;  . Co2 laser application N/A 5/42/7062    Procedure: LASER APPLICATION OF THE VULVA;  Surgeon: Janie Morning, MD;  Location: [facility];  Service: Gynecology;  Laterality:  N/A;  . Video bronchoscopy Bilateral 09/23/2013    Procedure: VIDEO BRONCHOSCOPY WITHOUT FLUORO;  Surgeon: Tanda Rockers, MD;  Location: [facility];  Service: Cardiopulmonary;  Laterality: Bilateral;  . Hemiarthroplasty shoulder fracture Left 05/25/2014    DR CHANDLER  . Reverse shoulder arthroplasty Left 05/24/2014    Procedure:   Hemi-Arthroplasty  Left Shoulder;  Surgeon: Nita Sells, MD;  Location: [facility];  Service: Orthopedics;  Laterality: Left;  . Orif humerus fracture Right 12/12/2014    Procedure: OPEN REDUCTION INTERNAL FIXATION (ORIF) PROXIMAL HUMERUS FRACTURE;  Surgeon: Nita Sells, MD;  Location: [facility];  Service: Orthopedics;  Laterality: Right;    Prior to Admission medications   Medication Sig Start Date End Date Taking? Authorizing Provider  acetaminophen (TYLENOL) 325 MG tablet Take 650 mg by mouth every 6 (six) hours as needed for mild pain or moderate pain (TAKES FOR ARM PAIN).    Yes Historical Provider, MD  albuterol (PROVENTIL) (2.5  MG/3ML) 0.083% nebulizer solution Take 2.5 mg by nebulization as needed for wheezing.   Yes Historical Provider, MD  albuterol (VENTOLIN HFA) 108 (90 BASE) MCG/ACT inhaler Inhale 2 puffs into the lungs every 6 (six) hours as needed for wheezing or shortness of breath. 11/10/14  Yes Curt Bears, MD  ALPRAZolam Duanne Moron) 2 MG tablet Take 1 tablet (2 mg total) by mouth 2 (two) times daily. 02/28/15  Yes Radene Gunning, NP  aspirin 81 MG chewable tablet Chew 1 tablet (81 mg total) by mouth daily. 02/28/15  Yes Lezlie Octave Black, NP  atenolol (TENORMIN) 50 MG tablet Take 1 tablet (50 mg total) by mouth daily. 11/25/13  Yes Velvet Bathe, MD  benzonatate (TESSALON) 100 MG capsule Take 100 mg by mouth 2 (two) times daily as needed for cough.    Yes Historical Provider, MD  chlorzoxazone (PARAFON) 500 MG tablet Take 500 mg by mouth 4 (four) times daily.   Yes Historical Provider, MD  dextromethorphan-guaiFENesin (MUCINEX DM) 30-600 MG per 12 hr tablet Take 2 tablets by mouth 2 (two) times daily.   Yes Historical Provider, MD  diphenoxylate-atropine (LOMOTIL) 2.5-0.025 MG per tablet Take 1 tablet by mouth 2 (two) times daily as needed for diarrhea or loose stools.   Yes Historical Provider, MD  esomeprazole (NEXIUM) 40 MG capsule Take 40 mg by mouth daily.    Yes Historical Provider, MD  fluticasone (FLONASE) 50 MCG/ACT nasal spray Place 1 spray into both nostrils daily as needed for allergies or rhinitis.   Yes Historical Provider, MD  insulin aspart (NOVOLOG FLEXPEN) 100 UNIT/ML FlexPen Inject 3 Units into the skin 3 (three) times daily with meals. Patient taking differently: Inject 5 Units into the skin 3 (three) times daily with meals.  01/11/15  Yes Debbe Odea, MD  Insulin Glargine (LANTUS SOLOSTAR) 100 UNIT/ML Solostar Pen Inject 20 Units into the skin daily at 10 pm. 01/11/15  Yes Debbe Odea, MD  levothyroxine (SYNTHROID) 50 MCG tablet Take 1.5 tablets (75 mcg total) by mouth daily before breakfast. 02/28/15   Yes Lezlie Octave Black, NP  lidocaine (XYLOCAINE) 2 % jelly APPLY TO AFFECTED AREA DAILY AS NEEDED. 03/07/15  Yes Melissa D Cross, NP  lisinopril (PRINIVIL,ZESTRIL) 5 MG tablet Take 1 tablet (5 mg total) by mouth daily. 01/11/15  Yes Debbe Odea, MD  nystatin ointment (MYCOSTATIN) Apply topically 3 (three) times daily. 01/11/15  Yes Debbe Odea, MD  oxyCODONE-acetaminophen (PERCOCET) 10-325 MG per tablet Take 1 tablet by mouth every 6 (six) hours as  needed for pain. 02/28/15  Yes Lezlie Octave Black, NP  sertraline (ZOLOFT) 100 MG tablet Take 2 tablets (200 mg total) by mouth daily. 01/23/15  Yes Cloria Spring, MD  tiotropium (SPIRIVA) 18 MCG inhalation capsule Place 1 capsule (18 mcg total) into inhaler and inhale daily. 12/14/14  Yes Debbe Odea, MD  traZODone (DESYREL) 150 MG tablet Take 300 mg by mouth at bedtime.   Yes Historical Provider, MD    Social History:  reports that she has never smoked. She does not have any smokeless tobacco history on file. She reports that she does not drink alcohol or use illicit drugs.  Family History  Problem Relation Age of Onset  . Cancer Mother     COLON  . Depression Mother   . Heart disease Father   . Diabetes Maternal Aunt   . Cancer Maternal Grandfather     KIDNEY   . Diabetes Maternal Grandfather   . Hypertension Maternal Grandfather   . Heart disease Maternal Grandfather   . Drug abuse Sister     Review of Systems:  Constitutional: Denies fever, chills, diaphoresis, appetite change and fatigue.  HEENT: Denies photophobia, eye pain, redness, hearing loss, ear pain, congestion, sore throat, rhinorrhea, sneezing, mouth sores, trouble swallowing, neck pain, neck stiffness and tinnitus.   Respiratory: Denies SOB, DOE, cough, chest tightness,  and wheezing.   Cardiovascular: Denies chest pain, palpitations and leg swelling.  Gastrointestinal: Denies nausea, vomiting, abdominal pain, diarrhea, constipation, blood in stool and abdominal distention.    Genitourinary: Denies dysuria, urgency, frequency, hematuria, flank pain and difficulty urinating.  Endocrine: Denies: hot or cold intolerance, sweats, changes in hair or nails, polyuria, polydipsia. Musculoskeletal: Denies myalgias, back pain, joint swelling, arthralgias and gait problem.  Skin: Denies pallor, rash and wound.  Neurological: Denies dizziness, seizures, syncope, weakness, light-headedness, numbness and headaches.  Hematological: Denies adenopathy. Easy bruising, personal or family bleeding history  Psychiatric/Behavioral: Denies suicidal ideation, mood changes, confusion, nervousness, sleep disturbance and agitation   Physical Exam: Blood pressure 114/63, pulse 79, temperature 98.8 F (37.1 C), resp. rate 18, height 5' 6"  (1.676 m), weight 70.5 kg (155 lb 6.8 oz), SpO2 96 %. Gen: AA Ox3 HEENT: [place]/AT/PERRL/dry mucous membranes Neck: supple, no JVD, no LAD, no bruits, no goiter CV: RRR, no M/R/G Lungs: CTA B Abd: S/NT/ND/+BS Ext: no C/C/E Neuro: nonfocal  Labs on Admission:  Results for orders placed or performed during the hospital encounter of 03/10/15 (from the past 48 hour(s))  POC CBG, ED     Status: Abnormal   Collection Time: 03/10/15 11:38 AM  Result Value Ref Range   Glucose-Capillary >600 (HH) 70 - 99 mg/dL  CBC with Differential     Status: Abnormal   Collection Time: 03/10/15 11:48 AM  Result Value Ref Range   WBC 6.2 4.0 - 10.5 K/uL   RBC 3.31 (L) 3.87 - 5.11 MIL/uL   Hemoglobin 10.9 (L) 12.0 - 15.0 g/dL   HCT 32.8 (L) 36.0 - 46.0 %   MCV 99.1 78.0 - 100.0 fL   MCH 32.9 26.0 - 34.0 pg   MCHC 33.2 30.0 - 36.0 g/dL   RDW 12.4 11.5 - 15.5 %   Platelets 276 150 - 400 K/uL   Neutrophils Relative % 85 (H) 43 - 77 %   Neutro Abs 5.3 1.7 - 7.7 K/uL   Lymphocytes Relative 11 (L) 12 - 46 %   Lymphs Abs 0.7 0.7 - 4.0 K/uL   Monocytes Relative 4 3 -  12 %   Monocytes Absolute 0.3 0.1 - 1.0 K/uL   Eosinophils Relative 0 0 - 5 %   Eosinophils Absolute 0.0  0.0 - 0.7 K/uL   Basophils Relative 0 0 - 1 %   Basophils Absolute 0.0 0.0 - 0.1 K/uL  Basic metabolic panel     Status: Abnormal   Collection Time: 03/10/15 11:48 AM  Result Value Ref Range   Sodium 129 (L) 135 - 145 mmol/L   Potassium 4.9 3.5 - 5.1 mmol/L   Chloride 90 (L) 96 - 112 mmol/L   CO2 19 19 - 32 mmol/L   Glucose, Bld 616 (HH) 70 - 99 mg/dL    Comment: CRITICAL RESULT CALLED TO, READ BACK BY AND VERIFIED WITH: PRUITT,RN ON 03/10/15 AT 1525 BY WOODS,M    BUN 26 (H) 6 - 23 mg/dL   Creatinine, Ser 1.30 (H) 0.50 - 1.10 mg/dL   Calcium 8.4 8.4 - 10.5 mg/dL   GFR calc non Af Amer 41 (L) >90 mL/min   GFR calc Af Amer 48 (L) >90 mL/min    Comment: (NOTE) The eGFR has been calculated using the CKD EPI equation. This calculation has not been validated in all clinical situations. eGFR's persistently <90 mL/min signify possible Chronic Kidney Disease.    Anion gap 20 (H) 5 - 15  Blood gas, arterial ([facility] ONLY)     Status: Abnormal   Collection Time: 03/10/15 12:20 PM  Result Value Ref Range   FIO2 0.21 %   Delivery systems ROOM AIR    pH, Arterial 7.334 (L) 7.350 - 7.450   pCO2 arterial 30.6 (L) 35.0 - 45.0 mmHg   pO2, Arterial 80.9 80.0 - 100.0 mmHg   Bicarbonate 15.9 (L) 20.0 - 24.0 mEq/L   TCO2 14.7 0 - 100 mmol/L   Acid-base deficit 8.8 (H) 0.0 - 2.0 mmol/L   O2 Saturation 94.2 %   Patient temperature 37.0    Collection site RIGHT BRACHIAL    Drawn by [phone]    Sample type ARTERIAL DRAW    Allens test (pass/fail) PASS PASS  CBG monitoring, ED     Status: Abnormal   Collection Time: 03/10/15  1:23 PM  Result Value Ref Range   Glucose-Capillary >600 (HH) 70 - 99 mg/dL  CBG monitoring, ED     Status: Abnormal   Collection Time: 03/10/15  2:27 PM  Result Value Ref Range   Glucose-Capillary 555 (HH) 70 - 99 mg/dL  CBG monitoring, ED     Status: Abnormal   Collection Time: 03/10/15  3:37 PM  Result Value Ref Range   Glucose-Capillary 443 (H) 70 - 99 mg/dL     Radiological Exams on Admission: No results found.  Assessment/Plan Principal Problem:   DKA (diabetic ketoacidoses) Active Problems:   HTN (hypertension)   Anxiety   Lung cancer   DKA -Due to inappropriate administration of insulin. -There have been concerns about her living independently and she will go to ALF upon DC. -IVF, gluco stabilizer protocol.  Anxiety/Depression -Continue home meds  DVT Prophylaxis -SQ heparin  Code Status -Full Code  Time Spent on Admission: 80 minutes  HERNANDEZ ACOSTA,ESTELA [facility] Pager: [phone] 03/10/2015, 5:30 PM

## 2015-03-10 NOTE — ED Notes (Signed)
CRITICAL VALUE ALERT  Critical value received: Glucose 616  Date of notification:  03/10/15  Time of notification:  3300  Critical value read back:Yes.    Nurse who received alert:  Eilene Ghazi  MD notified (1st page):Thurnell Garbe

## 2015-03-11 DIAGNOSIS — E1010 Type 1 diabetes mellitus with ketoacidosis without coma: Secondary | ICD-10-CM

## 2015-03-11 LAB — GLUCOSE, CAPILLARY
GLUCOSE-CAPILLARY: 286 mg/dL — AB (ref 70–99)
GLUCOSE-CAPILLARY: 121 mg/dL — AB (ref 70–99)
GLUCOSE-CAPILLARY: 148 mg/dL — AB (ref 70–99)
Glucose-Capillary: 101 mg/dL — ABNORMAL HIGH (ref 70–99)
Glucose-Capillary: 109 mg/dL — ABNORMAL HIGH (ref 70–99)
Glucose-Capillary: 142 mg/dL — ABNORMAL HIGH (ref 70–99)
Glucose-Capillary: 154 mg/dL — ABNORMAL HIGH (ref 70–99)
Glucose-Capillary: 197 mg/dL — ABNORMAL HIGH (ref 70–99)
Glucose-Capillary: 181 mg/dL — ABNORMAL HIGH (ref 70–99)
Glucose-Capillary: 313 mg/dL — ABNORMAL HIGH (ref 70–99)
Glucose-Capillary: 126 mg/dL — ABNORMAL HIGH (ref 70–99)

## 2015-03-11 LAB — BASIC METABOLIC PANEL
ANION GAP: 6 (ref 5–15)
Anion gap: 10 (ref 5–15)
Anion gap: 8 (ref 5–15)
Anion gap: 5 (ref 5–15)
BUN: 18 mg/dL (ref 6–23)
BUN: 16 mg/dL (ref 6–23)
BUN: 13 mg/dL (ref 6–23)
BUN: 12 mg/dL (ref 6–23)
CHLORIDE: 103 mmol/L (ref 96–112)
CHLORIDE: 100 mmol/L (ref 96–112)
CO2: 25 mmol/L (ref 19–32)
CO2: 22 mmol/L (ref 19–32)
CO2: 24 mmol/L (ref 19–32)
CO2: 27 mmol/L (ref 19–32)
CREATININE: 0.92 mg/dL (ref 0.50–1.10)
Calcium: 7.5 mg/dL — ABNORMAL LOW (ref 8.4–10.5)
Calcium: 7.7 mg/dL — ABNORMAL LOW (ref 8.4–10.5)
Calcium: 7.4 mg/dL — ABNORMAL LOW (ref 8.4–10.5)
Calcium: 7.4 mg/dL — ABNORMAL LOW (ref 8.4–10.5)
Chloride: 104 mmol/L (ref 96–112)
Chloride: 102 mmol/L (ref 96–112)
Creatinine, Ser: 0.94 mg/dL (ref 0.50–1.10)
Creatinine, Ser: 0.96 mg/dL (ref 0.50–1.10)
Creatinine, Ser: 0.97 mg/dL (ref 0.50–1.10)
GFR calc Af Amer: 71 mL/min — ABNORMAL LOW (ref >90)
GFR calc Af Amer: 69 mL/min — ABNORMAL LOW (ref >90)
GFR calc non Af Amer: 61 mL/min — ABNORMAL LOW (ref >90)
GFR calc non Af Amer: 60 mL/min — ABNORMAL LOW (ref >90)
GFR calc non Af Amer: 63 mL/min — ABNORMAL LOW (ref >90)
GFR, EST AFRICAN AMERICAN: 73 mL/min — AB (ref >90)
GFR, EST AFRICAN AMERICAN: 69 mL/min — AB (ref >90)
GFR, EST NON AFRICAN AMERICAN: 59 mL/min — AB (ref >90)
GLUCOSE: 247 mg/dL — AB (ref 70–99)
Glucose, Bld: 124 mg/dL — ABNORMAL HIGH (ref 70–99)
Glucose, Bld: 122 mg/dL — ABNORMAL HIGH (ref 70–99)
Glucose, Bld: 237 mg/dL — ABNORMAL HIGH (ref 70–99)
POTASSIUM: 3.6 mmol/L (ref 3.5–5.1)
POTASSIUM: 3 mmol/L — AB (ref 3.5–5.1)
Potassium: 3.5 mmol/L (ref 3.5–5.1)
Potassium: 3.5 mmol/L (ref 3.5–5.1)
SODIUM: 134 mmol/L — AB (ref 135–145)
SODIUM: 134 mmol/L — AB (ref 135–145)
Sodium: 136 mmol/L (ref 135–145)
Sodium: 132 mmol/L — ABNORMAL LOW (ref 135–145)

## 2015-03-11 MED ORDER — INSULIN ASPART 100 UNIT/ML ~~LOC~~ SOLN
0.0000 [IU] | Freq: Three times a day (TID) | SUBCUTANEOUS | Status: DC
  Administered 2015-03-11: 1 [IU] via SUBCUTANEOUS
  Administered 2015-03-11: 7 [IU] via SUBCUTANEOUS
  Administered 2015-03-11 – 2015-03-13 (×3): 2 [IU] via SUBCUTANEOUS
  Administered 2015-03-14: 3 [IU] via SUBCUTANEOUS
  Administered 2015-03-14: 1 [IU] via SUBCUTANEOUS
  Administered 2015-03-14: 2 [IU] via SUBCUTANEOUS

## 2015-03-11 MED ORDER — INSULIN ASPART 100 UNIT/ML ~~LOC~~ SOLN: 0.0000 [IU] | Freq: Every day | SUBCUTANEOUS | Status: DC

## 2015-03-11 NOTE — Progress Notes (Signed)
Pt transferring to unit 300 today. Pt/family is aware and agreeable to transfer. Assessment is unchanged from this morning and receiving RN has been given report. Belongings sent with pt at bedside.

## 2015-03-11 NOTE — Progress Notes (Signed)
[facility] PROGRESS NOTE  [name] DPO:[id number] DOB: [birth year] DOA: 03/10/2015 PCP: [name], MD  Assessment/Plan: DKA -Resolved. -CBGs well controlled on current regimen. -Suspect cause for DKA was difficulty with injecting insulin.  Anxiety/Depression -Continue home meds  Code Status: Full code Family Communication: Patient only  Disposition Plan: To ALF on Monday   Consultants:  None   Antibiotics:  None   Subjective: No active issues.  Objective: Filed Vitals:   03/11/15 0900 03/11/15 1000 03/11/15 1100 03/11/15 1407  BP: 126/72 119/70 119/66 135/57  Pulse: 80 86 75 75  Temp:    98.6 F (37 C)  TempSrc:    Oral  Resp: 18 24 15 20   Height:      Weight:      SpO2: 100% 100% 99% 99%    Intake/Output Summary (Last 24 hours) at 03/11/15 1613 Last data filed at 03/11/15 1523  Gross per 24 hour  Intake 3710.12 ml  Output    200 ml  Net 3510.12 ml   Filed Weights   03/10/15 1700 03/11/15 0400  Weight: 70.5 kg (155 lb 6.8 oz) 75.2 kg (165 lb 12.6 oz)    Exam:   General:  AA Ox3  Cardiovascular: RRR  Respiratory: CTA B  Abdomen: S/NT/ND/+BS  Extremities: no C/C/E   Neurologic:  Non-focal  Data Reviewed: Basic Metabolic Panel:  Recent Labs Lab 03/10/15 2120 03/11/15 0245 03/11/15 0550 03/11/15 1020 03/11/15 1358  NA 135 134* 136 132* 134*  K 3.6 3.5 3.6 3.5 3.0*  CL 100 103 104 100 102  CO2 26 25 22 24 27   GLUCOSE 157* 124* 122* 247* 237*  BUN 19 18 16 13 12   CREATININE 1.03 0.94 0.96 0.92 0.97  CALCIUM 8.1* 7.5* 7.7* 7.4* 7.4*   Liver Function Tests: No results for input(s): AST, ALT, ALKPHOS, BILITOT, PROT, ALBUMIN in the last 168 hours. No results for input(s): LIPASE, AMYLASE in the last 168 hours. No results for input(s): AMMONIA in the last 168 hours. CBC:  Recent Labs Lab 03/10/15 1148  WBC 6.2  NEUTROABS 5.3  HGB 10.9*  HCT 32.8*  MCV 99.1  PLT 276   Cardiac Enzymes: No results for  input(s): CKTOTAL, CKMB, CKMBINDEX, TROPONINI in the last 168 hours. BNP (last 3 results)  Recent Labs  02/26/15 0857  BNP 468.0*    ProBNP (last 3 results) No results for input(s): PROBNP in the last 8760 hours.  CBG:  Recent Labs Lab 03/10/15 2136 03/10/15 2223 03/10/15 2335 03/11/15 0102 03/11/15 0734  GLUCAP 142* 121* 101* 109* 148*    No results found for this or any previous visit (from the past 240 hour(s)).   Studies: No results found.  Scheduled Meds: . ALPRAZolam  2 mg Oral BID  . aspirin  81 mg Oral Daily  . atenolol  50 mg Oral Daily  . chlorzoxazone  500 mg Oral QID  . enoxaparin (LOVENOX) injection  40 mg Subcutaneous Q24H  . insulin aspart  0-5 Units Subcutaneous QHS  . insulin aspart  0-9 Units Subcutaneous TID WC  . insulin glargine  20 Units Subcutaneous Daily  . levothyroxine  75 mcg Oral QAC breakfast  . lisinopril  5 mg Oral Daily  . pantoprazole  80 mg Oral Daily  . sertraline  200 mg Oral Daily  . tiotropium  18 mcg Inhalation Daily  . traZODone  300 mg Oral QHS   Continuous Infusions: . sodium chloride 125 mL/hr at 03/11/15 0932  Principal Problem:   DKA (diabetic ketoacidoses) Active Problems:   HTN (hypertension)   Anxiety   Lung cancer    Time spent: 25 minutes. Greater than 50% of this time was spent in direct contact with the patient coordinating care.    [name]  [facility] Pager [phone]  If 7PM-7AM, please contact night-coverage at [email or web address], password [facility] 03/11/2015, 4:13 PM  LOS: 1 day

## 2015-03-12 LAB — GLUCOSE, CAPILLARY
GLUCOSE-CAPILLARY: 96 mg/dL (ref 70–99)
GLUCOSE-CAPILLARY: 113 mg/dL — AB (ref 70–99)
Glucose-Capillary: 173 mg/dL — ABNORMAL HIGH (ref 70–99)
Glucose-Capillary: 132 mg/dL — ABNORMAL HIGH (ref 70–99)

## 2015-03-12 LAB — BASIC METABOLIC PANEL
Anion gap: 8 (ref 5–15)
BUN: 7 mg/dL (ref 6–23)
CHLORIDE: 101 mmol/L (ref 96–112)
CO2: 29 mmol/L (ref 19–32)
CREATININE: 0.77 mg/dL (ref 0.50–1.10)
Calcium: 8 mg/dL — ABNORMAL LOW (ref 8.4–10.5)
GFR calc Af Amer: >90 mL/min (ref >90)
GFR calc non Af Amer: 85 mL/min — ABNORMAL LOW (ref >90)
Glucose, Bld: 90 mg/dL (ref 70–99)
Potassium: 3 mmol/L — ABNORMAL LOW (ref 3.5–5.1)
Sodium: 138 mmol/L (ref 135–145)

## 2015-03-12 LAB — CBC
HCT: 33.6 % — ABNORMAL LOW (ref 36.0–46.0)
Hemoglobin: 11 g/dL — ABNORMAL LOW (ref 12.0–15.0)
MCH: 32.9 pg (ref 26.0–34.0)
MCHC: 32.7 g/dL (ref 30.0–36.0)
MCV: 100.6 fL — ABNORMAL HIGH (ref 78.0–100.0)
Platelets: 235 10*3/uL (ref 150–400)
RBC: 3.34 MIL/uL — AB (ref 3.87–5.11)
RDW: 13.6 % (ref 11.5–15.5)
WBC: 5.2 10*3/uL (ref 4.0–10.5)

## 2015-03-12 MED ORDER — POTASSIUM CHLORIDE CRYS ER 20 MEQ PO TBCR
40.0000 meq | EXTENDED_RELEASE_TABLET | ORAL | Status: AC
  Administered 2015-03-12 (×3): 40 meq via ORAL
  Filled 2015-03-12 (×3): qty 2

## 2015-03-12 NOTE — Progress Notes (Signed)
[facility] PROGRESS NOTE  AARON BOEH XBD:[id number] DOB: 03-Feb-1946 DOA: 03/10/2015 PCP: [name], MD  Assessment/Plan: DKA -Resolved. -CBGs well controlled on current regimen. -Suspect cause for DKA was difficulty with injecting insulin.  Anxiety/Depression -Continue home meds  Code Status: Full code Family Communication: Patient only  Disposition Plan: To ALF on Monday   Consultants:  None   Antibiotics:  None   Subjective: No active issues.  Objective: Filed Vitals:   03/11/15 2116 03/12/15 0535 03/12/15 0729 03/12/15 0904  BP: 143/74 165/76  135/72  Pulse: 68 74  70  Temp: 98.8 F (37.1 C) 97.9 F (36.6 C)    TempSrc: Oral Oral    Resp: 20 20    Height:      Weight:      SpO2: 99% 99% 98%     Intake/Output Summary (Last 24 hours) at 03/12/15 1338 Last data filed at 03/11/15 1820  Gross per 24 hour  Intake  787.5 ml  Output    400 ml  Net  387.5 ml   Filed Weights   03/10/15 1700 03/11/15 0400  Weight: 70.5 kg (155 lb 6.8 oz) 75.2 kg (165 lb 12.6 oz)    Exam:   General:  AA Ox3  Cardiovascular: RRR  Respiratory: CTA B  Abdomen: S/NT/ND/+BS  Extremities: no C/C/E   Neurologic:  Non-focal  Data Reviewed: Basic Metabolic Panel:  Recent Labs Lab 03/11/15 0245 03/11/15 0550 03/11/15 1020 03/11/15 1358 03/12/15 0706  NA 134* 136 132* 134* 138  K 3.5 3.6 3.5 3.0* 3.0*  CL 103 104 100 102 101  CO2 25 22 24 27 29   GLUCOSE 124* 122* 247* 237* 90  BUN 18 16 13 12 7   CREATININE 0.94 0.96 0.92 0.97 0.77  CALCIUM 7.5* 7.7* 7.4* 7.4* 8.0*   Liver Function Tests: No results for input(s): AST, ALT, ALKPHOS, BILITOT, PROT, ALBUMIN in the last 168 hours. No results for input(s): LIPASE, AMYLASE in the last 168 hours. No results for input(s): AMMONIA in the last 168 hours. CBC:  Recent Labs Lab 03/10/15 1148 03/12/15 0706  WBC 6.2 5.2  NEUTROABS 5.3  --   HGB 10.9* 11.0*  HCT 32.8* 33.6*  MCV 99.1 100.6*   PLT 276 235   Cardiac Enzymes: No results for input(s): CKTOTAL, CKMB, CKMBINDEX, TROPONINI in the last 168 hours. BNP (last 3 results)  Recent Labs  02/26/15 0857  BNP 468.0*    ProBNP (last 3 results) No results for input(s): PROBNP in the last 8760 hours.  CBG:  Recent Labs Lab 03/11/15 1213 03/11/15 1634 03/11/15 2226 03/12/15 0727 03/12/15 1202  GLUCAP 313* 181* 126* 96 173*    No results found for this or any previous visit (from the past 240 hour(s)).   Studies: No results found.  Scheduled Meds: . ALPRAZolam  2 mg Oral BID  . aspirin  81 mg Oral Daily  . atenolol  50 mg Oral Daily  . chlorzoxazone  500 mg Oral QID  . enoxaparin (LOVENOX) injection  40 mg Subcutaneous Q24H  . insulin aspart  0-5 Units Subcutaneous QHS  . insulin aspart  0-9 Units Subcutaneous TID WC  . insulin glargine  20 Units Subcutaneous Daily  . levothyroxine  75 mcg Oral QAC breakfast  . lisinopril  5 mg Oral Daily  . pantoprazole  80 mg Oral Daily  . potassium chloride  40 mEq Oral Q4H  . sertraline  200 mg Oral Daily  . tiotropium  18  mcg Inhalation Daily  . traZODone  300 mg Oral QHS   Continuous Infusions: . sodium chloride 125 mL/hr at 03/11/15 1802    Principal Problem:   DKA (diabetic ketoacidoses) Active Problems:   HTN (hypertension)   Anxiety   Lung cancer   Hypokalemia    Time spent: 25 minutes. Greater than 50% of this time was spent in direct contact with the patient coordinating care.    [name]  [facility] Pager [phone]  If 7PM-7AM, please contact night-coverage at [email or web address], password [facility] 03/12/2015, 1:38 PM  LOS: 2 days

## 2015-03-12 NOTE — Progress Notes (Signed)
Encourage patient to get up in the chair patient refused.

## 2015-03-13 LAB — GLUCOSE, CAPILLARY
Glucose-Capillary: 356 mg/dL — ABNORMAL HIGH (ref 70–99)
Glucose-Capillary: 86 mg/dL (ref 70–99)
Glucose-Capillary: 162 mg/dL — ABNORMAL HIGH (ref 70–99)
Glucose-Capillary: 95 mg/dL (ref 70–99)
Glucose-Capillary: 170 mg/dL — ABNORMAL HIGH (ref 70–99)

## 2015-03-13 MED ORDER — TUBERCULIN PPD 5 UNIT/0.1ML ID SOLN
5.0000 [IU] | Freq: Once | INTRADERMAL | Status: DC
  Filled 2015-03-13: qty 0.1

## 2015-03-13 MED ORDER — POTASSIUM CHLORIDE CRYS ER 20 MEQ PO TBCR
40.0000 meq | EXTENDED_RELEASE_TABLET | ORAL | Status: AC
  Administered 2015-03-13 (×2): 40 meq via ORAL
  Filled 2015-03-13 (×2): qty 2

## 2015-03-13 NOTE — Care Management Utilization Note (Signed)
UR completed 

## 2015-03-13 NOTE — Clinical Social Work Placement (Signed)
Clinical Social Work Department CLINICAL SOCIAL WORK PLACEMENT NOTE 03/13/2015  Patient:  [name], [name]  Account Number:  [id number] Admit date:  03/10/2015  Clinical Social Worker:  Benay Pike, LCSW  Date/time:  03/13/2015 11:49 AM  Clinical Social Work is seeking post-discharge placement for this patient at the following level of care:   ASSISTED LIVING/REST HOME   (*CSW will update this form in Epic as items are completed)   03/13/2015  Patient/family provided with [place] Department of Clinical Social Work's list of facilities offering this level of care within the geographic area requested by the patient (or if unable, by the patient's family).  03/13/2015  Patient/family informed of their freedom to choose among providers that offer the needed level of care, that participate in Medicare, Medicaid or managed care program needed by the patient, have an available bed and are willing to accept the patient.  03/13/2015  Patient/family informed of [facility]' ownership interest in [facility], as well as of the fact that they are under no obligation to receive care at this facility.  PASARR submitted to EDS on 03/13/2015 PASARR number received on   FL2 transmitted to all facilities in geographic area requested by pt/family on  03/13/2015 FL2 transmitted to all facilities within larger geographic area on   Patient informed that his/her managed care company has contracts with or will negotiate with  certain facilities, including the following:     Patient/family informed of bed offers received:  03/13/2015 Patient chooses bed at [place] Physician recommends and patient chooses bed at    Patient to be transferred to  on   Patient to be transferred to facility by  Patient and family notified of transfer on  Name of family member notified:    The following physician request were entered in Epic:   Additional Comments:  Benay Pike,  [place]

## 2015-03-13 NOTE — Clinical Social Work Psychosocial (Signed)
Clinical Social Work Department BRIEF PSYCHOSOCIAL ASSESSMENT 03/13/2015  Patient:  CALYNN, FERRERO     Account Number:  [id number]     [place] date:  03/10/2015  Clinical Social Worker:  Wyatt Haste  Date/Time:  03/13/2015 11:52 AM  Referred by:  CSW  Date Referred:  03/13/2015 Referred for  ALF Placement   Other Referral:   Interview type:  Patient Other interview type:    PSYCHOSOCIAL DATA Living Status:  ALONE Admitted from facility:   Level of care:   Primary support name:  [name] Primary support relationship to patient:  CHILD, ADULT Degree of support available:   supportive per pt, but out of town a lot    CURRENT CONCERNS Current Concerns  Post-Acute Placement   Other Concerns:    [place] / PLAN CSW met with pt at bedside. Pt alert and oriented and known to CSW from previous admisison. Pt lives alone and was offered ALF placement last hospital stay, but refused because her preferred facility did not have a bed available. Pt returns due to DKA and now states that they have checked into [facility] and pt felt that this would be a good fit for her. Facility is willing to accept pt. Pt has a son, [name] who she describes as her best support. However, he is a Administrator and is out of town a lot. Pt has applied for Medicaid. She admits that she needs assistance, particularly with managing her diabetes. Pt ambulates with a walker. Her family and friends typically provided meals, but pt said she was able to make simple meals if needed. CSW submitted for pasarr and pt will require face to face evaluation. Pt is medically stable when received. [facility] and pt updated.   Assessment/plan status:  Psychosocial Support/Ongoing Assessment of Needs Other assessment/ plan:   Information/referral to community resources:   [facility]    PATIENT'S/FAMILY'S RESPONSE TO PLAN OF CARE: Pt plans to go to [facility] at d/c once pasarr number received. CSW will continue to  follow.       Benay Pike, [place]

## 2015-03-13 NOTE — Progress Notes (Addendum)
[facility] PROGRESS NOTE  [name] MVE:[id number] DOB: July 29, 1946 DOA: 03/10/2015 PCP: [name], MD  Assessment/Plan: DKA -Resolved. -CBGs well controlled on current regimen. -Suspect cause for DKA was difficulty with injecting insulin.  Anxiety/Depression -Continue home meds  Hypokalemia -Continue to replete PO. -Check Mg level.  Code Status: Full code Family Communication: Patient only  Disposition Plan: Awaiting ALF   Consultants:  None   Antibiotics:  None   Subjective: No active issues.  Objective: Filed Vitals:   03/13/15 0514 03/13/15 0930 03/13/15 1114 03/13/15 1517  BP: 172/86 170/85  155/77  Pulse: 74 75  63  Temp: 98 F (36.7 C) 98 F (36.7 C)  99.2 F (37.3 C)  TempSrc: Oral Oral  Oral  Resp: 18 18  18   Height:      Weight:      SpO2: 92% 93% 93% 96%    Intake/Output Summary (Last 24 hours) at 03/13/15 1617 Last data filed at 03/13/15 1254  Gross per 24 hour  Intake    600 ml  Output   1100 ml  Net   -500 ml   Filed Weights   03/10/15 1700 03/11/15 0400  Weight: 70.5 kg (155 lb 6.8 oz) 75.2 kg (165 lb 12.6 oz)    Exam:   General:  AA Ox3  Cardiovascular: RRR  Respiratory: CTA B  Abdomen: S/NT/ND/+BS  Extremities: no C/C/E   Neurologic:  Non-focal  Data Reviewed: Basic Metabolic Panel:  Recent Labs Lab 03/11/15 0245 03/11/15 0550 03/11/15 1020 03/11/15 1358 03/12/15 0706  NA 134* 136 132* 134* 138  K 3.5 3.6 3.5 3.0* 3.0*  CL 103 104 100 102 101  CO2 25 22 24 27 29   GLUCOSE 124* 122* 247* 237* 90  BUN 18 16 13 12 7   CREATININE 0.94 0.96 0.92 0.97 0.77  CALCIUM 7.5* 7.7* 7.4* 7.4* 8.0*   Liver Function Tests: No results for input(s): AST, ALT, ALKPHOS, BILITOT, PROT, ALBUMIN in the last 168 hours. No results for input(s): LIPASE, AMYLASE in the last 168 hours. No results for input(s): AMMONIA in the last 168 hours. CBC:  Recent Labs Lab 03/10/15 1148 03/12/15 0706  WBC 6.2 5.2    NEUTROABS 5.3  --   HGB 10.9* 11.0*  HCT 32.8* 33.6*  MCV 99.1 100.6*  PLT 276 235   Cardiac Enzymes: No results for input(s): CKTOTAL, CKMB, CKMBINDEX, TROPONINI in the last 168 hours. BNP (last 3 results)  Recent Labs  02/26/15 0857  BNP 468.0*    ProBNP (last 3 results) No results for input(s): PROBNP in the last 8760 hours.  CBG:  Recent Labs Lab 03/12/15 1202 03/12/15 1638 03/12/15 2101 03/13/15 0733 03/13/15 1209  GLUCAP 173* 113* 132* 86 162*    No results found for this or any previous visit (from the past 240 hour(s)).   Studies: No results found.  Scheduled Meds: . ALPRAZolam  2 mg Oral BID  . aspirin  81 mg Oral Daily  . atenolol  50 mg Oral Daily  . chlorzoxazone  500 mg Oral QID  . enoxaparin (LOVENOX) injection  40 mg Subcutaneous Q24H  . insulin aspart  0-5 Units Subcutaneous QHS  . insulin aspart  0-9 Units Subcutaneous TID WC  . insulin glargine  20 Units Subcutaneous Daily  . levothyroxine  75 mcg Oral QAC breakfast  . lisinopril  5 mg Oral Daily  . pantoprazole  80 mg Oral Daily  . sertraline  200 mg Oral Daily  .  tiotropium  18 mcg Inhalation Daily  . traZODone  300 mg Oral QHS  . tuberculin  5 Units Intradermal Once   Continuous Infusions: . sodium chloride 125 mL/hr at 03/13/15 0747    Principal Problem:   DKA (diabetic ketoacidoses) Active Problems:   HTN (hypertension)   Anxiety   Lung cancer   Hypokalemia    Time spent: 25 minutes. Greater than 50% of this time was spent in direct contact with the patient coordinating care.    [name]  [facility] Pager [phone]  If 7PM-7AM, please contact night-coverage at [email or web address], password [facility] 03/13/2015, 4:17 PM  LOS: 3 days

## 2015-03-13 NOTE — Care Management Note (Addendum)
    Page 1 of 1   03/15/2015     2:34:30 PM CARE MANAGEMENT NOTE 03/15/2015  Patient:  [name], [name]   Account Number:  [id number]  Date Initiated:  03/13/2015  Documentation initiated by:  CHILDRESS,JESSICA  Subjective/Objective Assessment:   Pt is from home, has home O2 through [place] and [facility] RN/PT through [employer]. Pt plans to dsicharge to [facility]. CSW is aware and will arrange for placement at [facility]. Lorenza Chick has been notified of discharge plan and discontinuation of     Action/Plan:   services. Pt will have PT at [facility]. Facility has requested [facility]. Romualdo Bolk of [facility] has been notified of referral and will obtain pt information from chart. No further CM needs identified.   Anticipated DC Date:  03/14/2015   Anticipated DC Plan:  ASSISTED LIVING / REST HOME  In-house referral  Clinical Social Worker      DC Planning Services  CM consult      Choice offered to / List presented to:             Status of service:  Completed, signed off Medicare Important Message given?  YES (If response is "NO", the following Medicare IM given date fields will be blank) Date Medicare IM given:  03/13/2015 Medicare IM given by:  Jolene Provost Date Additional Medicare IM given:   Additional Medicare IM given by:    Discharge Disposition:  ASSISTED LIVING  Per UR Regulation:  Reviewed for med. necessity/level of care/duration of stay  If discussed at [place] of Stay Meetings, dates discussed:   03/14/2015    Comments:  03/15/2015 [place], RN, MSN, CM Pt discharge to [facility] ALF with [facility] PT/RN services. Romualdo Bolk aware of discharge. [place] left negative TB test results with CM, they were given to CSW and included in paperwork sent to [facility]. 03/13/2015 [place], RN, MSN, CM Per [employer], Pt has had TB shot read negative by their RN. Will provide documentation prior to discharge.

## 2015-03-14 LAB — GLUCOSE, CAPILLARY
GLUCOSE-CAPILLARY: 217 mg/dL — AB (ref 70–99)
GLUCOSE-CAPILLARY: 82 mg/dL (ref 70–99)
Glucose-Capillary: 174 mg/dL — ABNORMAL HIGH (ref 70–99)
Glucose-Capillary: 130 mg/dL — ABNORMAL HIGH (ref 70–99)

## 2015-03-14 LAB — MAGNESIUM: Magnesium: 1.5 mg/dL (ref 1.5–2.5)

## 2015-03-14 MED ORDER — POTASSIUM CHLORIDE CRYS ER 20 MEQ PO TBCR
40.0000 meq | EXTENDED_RELEASE_TABLET | Freq: Once | ORAL | Status: AC
  Administered 2015-03-14: 40 meq via ORAL
  Filled 2015-03-14: qty 2

## 2015-03-14 NOTE — Progress Notes (Signed)
[facility] PROGRESS NOTE  [name] RCV:[id number] DOB: November 18, 1946 DOA: 03/10/2015 PCP: [name], MD  Assessment/Plan: DKA -Resolved. -CBGs well controlled on current regimen. -Suspect cause for DKA was difficulty with injecting insulin.  Anxiety/Depression -Continue home meds  Hypokalemia -Continue to replete PO. -Check Mg level. -Repeat BMET in am.  Code Status: Full code Family Communication: Patient only  Disposition Plan: Awaiting ALF   Consultants:  None   Antibiotics:  None   Subjective: No active issues.  Objective: Filed Vitals:   03/14/15 0642 03/14/15 0823 03/14/15 0908 03/14/15 1444  BP: 133/70  147/93 139/74  Pulse: 64  78 66  Temp: 98.3 F (36.8 C)   99.4 F (37.4 C)  TempSrc: Oral   Oral  Resp: 18   18  Height:      Weight:      SpO2: 94% 93%  93%    Intake/Output Summary (Last 24 hours) at 03/14/15 1629 Last data filed at 03/14/15 1354  Gross per 24 hour  Intake 2268.75 ml  Output   1100 ml  Net 1168.75 ml   Filed Weights   03/10/15 1700 03/11/15 0400  Weight: 70.5 kg (155 lb 6.8 oz) 75.2 kg (165 lb 12.6 oz)    Exam:   General:  AA Ox3  Cardiovascular: RRR  Respiratory: CTA B  Abdomen: S/NT/ND/+BS  Extremities: no C/C/E   Neurologic:  Non-focal  Data Reviewed: Basic Metabolic Panel:  Recent Labs Lab 03/11/15 0245 03/11/15 0550 03/11/15 1020 03/11/15 1358 03/12/15 0706  NA 134* 136 132* 134* 138  K 3.5 3.6 3.5 3.0* 3.0*  CL 103 104 100 102 101  CO2 25 22 24 27 29   GLUCOSE 124* 122* 247* 237* 90  BUN 18 16 13 12 7   CREATININE 0.94 0.96 0.92 0.97 0.77  CALCIUM 7.5* 7.7* 7.4* 7.4* 8.0*   Liver Function Tests: No results for input(s): AST, ALT, ALKPHOS, BILITOT, PROT, ALBUMIN in the last 168 hours. No results for input(s): LIPASE, AMYLASE in the last 168 hours. No results for input(s): AMMONIA in the last 168 hours. CBC:  Recent Labs Lab 03/10/15 1148 03/12/15 0706  WBC 6.2 5.2    NEUTROABS 5.3  --   HGB 10.9* 11.0*  HCT 32.8* 33.6*  MCV 99.1 100.6*  PLT 276 235   Cardiac Enzymes: No results for input(s): CKTOTAL, CKMB, CKMBINDEX, TROPONINI in the last 168 hours. BNP (last 3 results)  Recent Labs  02/26/15 0857  BNP 468.0*    ProBNP (last 3 results) No results for input(s): PROBNP in the last 8760 hours.  CBG:  Recent Labs Lab 03/13/15 1209 03/13/15 1650 03/13/15 2112 03/14/15 0745 03/14/15 1156  GLUCAP 162* 95 170* 174* 130*    No results found for this or any previous visit (from the past 240 hour(s)).   Studies: No results found.  Scheduled Meds: . ALPRAZolam  2 mg Oral BID  . aspirin  81 mg Oral Daily  . atenolol  50 mg Oral Daily  . chlorzoxazone  500 mg Oral QID  . enoxaparin (LOVENOX) injection  40 mg Subcutaneous Q24H  . insulin aspart  0-5 Units Subcutaneous QHS  . insulin aspart  0-9 Units Subcutaneous TID WC  . insulin glargine  20 Units Subcutaneous Daily  . levothyroxine  75 mcg Oral QAC breakfast  . lisinopril  5 mg Oral Daily  . pantoprazole  80 mg Oral Daily  . sertraline  200 mg Oral Daily  . tiotropium  18  mcg Inhalation Daily  . traZODone  300 mg Oral QHS  . tuberculin  5 Units Intradermal Once   Continuous Infusions: . sodium chloride 125 mL/hr at 03/13/15 0747    Principal Problem:   DKA (diabetic ketoacidoses) Active Problems:   HTN (hypertension)   Anxiety   Lung cancer   Hypokalemia    Time spent: 25 minutes. Greater than 50% of this time was spent in direct contact with the patient coordinating care.    [name]  [facility] Pager [phone]  If 7PM-7AM, please contact night-coverage at [email or web address], password [facility] 03/14/2015, 4:29 PM  LOS: 4 days

## 2015-03-14 NOTE — Clinical Social Work Note (Signed)
CSW received call from pasarr screener and pt will have face to face evaluation this afternoon.  [name], [place]

## 2015-03-15 LAB — BASIC METABOLIC PANEL
Anion gap: 7 (ref 5–15)
BUN: 6 mg/dL (ref 6–23)
CALCIUM: 9.1 mg/dL (ref 8.4–10.5)
CO2: 36 mmol/L — ABNORMAL HIGH (ref 19–32)
CREATININE: 0.82 mg/dL (ref 0.50–1.10)
Chloride: 95 mmol/L — ABNORMAL LOW (ref 96–112)
GFR, EST AFRICAN AMERICAN: 84 mL/min — AB (ref >90)
GFR, EST NON AFRICAN AMERICAN: 72 mL/min — AB (ref >90)
Glucose, Bld: 102 mg/dL — ABNORMAL HIGH (ref 70–99)
Potassium: 4.3 mmol/L (ref 3.5–5.1)
Sodium: 138 mmol/L (ref 135–145)

## 2015-03-15 LAB — GLUCOSE, CAPILLARY: GLUCOSE-CAPILLARY: 103 mg/dL — AB (ref 70–99)

## 2015-03-15 MED ORDER — TRAZODONE HCL 150 MG PO TABS: 300.0000 mg | ORAL_TABLET | Freq: Every day | ORAL | Status: DC

## 2015-03-15 MED ORDER — OXYCODONE-ACETAMINOPHEN 10-325 MG PO TABS: 1.0000 | ORAL_TABLET | Freq: Four times a day (QID) | ORAL | Status: DC | PRN

## 2015-03-15 MED ORDER — INSULIN GLARGINE 100 UNIT/ML SOLOSTAR PEN: 20.0000 [IU] | PEN_INJECTOR | Freq: Every day | SUBCUTANEOUS | Status: AC

## 2015-03-15 MED ORDER — ALBUTEROL SULFATE HFA 108 (90 BASE) MCG/ACT IN AERS: 2.0000 | INHALATION_SPRAY | Freq: Four times a day (QID) | RESPIRATORY_TRACT | Status: DC | PRN

## 2015-03-15 MED ORDER — LISINOPRIL 5 MG PO TABS: 5.0000 mg | ORAL_TABLET | Freq: Every day | ORAL | Status: AC

## 2015-03-15 MED ORDER — LEVOTHYROXINE SODIUM 50 MCG PO TABS: 75.0000 ug | ORAL_TABLET | Freq: Every day | ORAL | Status: DC

## 2015-03-15 MED ORDER — CHLORZOXAZONE 500 MG PO TABS: 500.0000 mg | ORAL_TABLET | Freq: Four times a day (QID) | ORAL | Status: AC

## 2015-03-15 MED ORDER — SERTRALINE HCL 100 MG PO TABS
200.0000 mg | ORAL_TABLET | Freq: Every day | ORAL | Status: DC
Start: 2015-03-15 — End: 2015-03-31

## 2015-03-15 MED ORDER — ESOMEPRAZOLE MAGNESIUM 40 MG PO CPDR: 40.0000 mg | DELAYED_RELEASE_CAPSULE | Freq: Every day | ORAL | Status: AC

## 2015-03-15 MED ORDER — TIOTROPIUM BROMIDE MONOHYDRATE 18 MCG IN CAPS: 18.0000 ug | ORAL_CAPSULE | Freq: Every day | RESPIRATORY_TRACT | Status: DC

## 2015-03-15 MED ORDER — ALPRAZOLAM 2 MG PO TABS: 2.0000 mg | ORAL_TABLET | Freq: Two times a day (BID) | ORAL | Status: DC

## 2015-03-15 MED ORDER — INSULIN ASPART 100 UNIT/ML FLEXPEN: 5.0000 [IU] | PEN_INJECTOR | Freq: Three times a day (TID) | SUBCUTANEOUS | Status: DC

## 2015-03-15 MED ORDER — FLUTICASONE PROPIONATE 50 MCG/ACT NA SUSP: 1.0000 | Freq: Every day | NASAL | Status: DC | PRN

## 2015-03-15 MED ORDER — ATENOLOL 50 MG PO TABS: 50.0000 mg | ORAL_TABLET | Freq: Every day | ORAL | Status: AC

## 2015-03-15 NOTE — Clinical Social Work Placement (Signed)
Clinical Social Work Department CLINICAL SOCIAL WORK PLACEMENT NOTE 03/15/2015  Patient:  [name], [name]  Account Number:  [id number] Admit date:  03/10/2015  Clinical Social Worker:  Benay Pike, LCSW  Date/time:  03/13/2015 11:49 AM  Clinical Social Work is seeking post-discharge placement for this patient at the following level of care:   ASSISTED LIVING/REST HOME   (*CSW will update this form in Epic as items are completed)   03/13/2015  Patient/family provided with [place] Department of Clinical Social Work's list of facilities offering this level of care within the geographic area requested by the patient (or if unable, by the patient's family).  03/13/2015  Patient/family informed of their freedom to choose among providers that offer the needed level of care, that participate in Medicare, Medicaid or managed care program needed by the patient, have an available bed and are willing to accept the patient.  03/13/2015  Patient/family informed of [facility]' ownership interest in [facility], as well as of the fact that they are under no obligation to receive care at this facility.  PASARR submitted to EDS on 03/13/2015 PASARR number received on 03/15/2015  FL2 transmitted to all facilities in geographic area requested by pt/family on  03/13/2015 FL2 transmitted to all facilities within larger geographic area on   Patient informed that his/her managed care company has contracts with or will negotiate with  certain facilities, including the following:     Patient/family informed of bed offers received:  03/13/2015 Patient chooses bed at [place] Physician recommends and patient chooses bed at    Patient to be transferred to [place] on  03/15/2015 Patient to be transferred to facility by facility Lucianne Lei Patient and family notified of transfer on 03/15/2015 Name of family member notified:  Pam- cousin  The following  physician request were entered in Epic:   Additional Comments:  Benay Pike, [place]

## 2015-03-15 NOTE — Care Management Note (Signed)
UR completed 

## 2015-03-15 NOTE — Clinical Social Work Note (Signed)
Pasarr number received this morning. Pt d/c today to [facility]. Pt, pt's cousin Pam, and facility aware and agreeable. D/C summary and FL2 faxed.  Benay Pike, [place]

## 2015-03-15 NOTE — Progress Notes (Signed)
NURSING PROGRESS NOTE  [name] [id number] Discharge Data: 03/15/2015 11:22 AM Attending Provider: No att. providers found WIO:[facility], [facility], MD   [name] to be D/C'd Nursing Home per MD order.    All IV's discontinued and monitored for bleeding.  All belongings returned to patient for patient to take home.  Packet given to transporter from ALF.   Patient left floor via wheelchair, escorted by NT.  Last Documented Vital Signs:  Blood pressure 132/76, pulse 72, temperature 97.9 F (36.6 C), temperature source Oral, resp. rate 18, height 5\' 6"  (1.676 m), weight 75.2 kg (165 lb 12.6 oz), SpO2 90 %.  [name] D

## 2015-03-15 NOTE — Plan of Care (Signed)
Problem: Phase II Progression Outcomes Goal: Nausea & vomiting resolved Outcome: Completed/Met Date Met:  03/15/15 Patient tolerating diet well; has excellent appetite. Goal: Discharge plan established Outcome: Completed/Met Date Met:  03/15/15 Plan for patient to discharge to [facility].

## 2015-03-15 NOTE — Discharge Summary (Addendum)
Physician Discharge Summary  [name]:[id number] DOB: [birth year] DOA: 03/10/2015  PCP: Gabriela Cahill, MD  Admit date: 03/10/2015 Discharge date: 03/15/2015  Time spent: 45 minutes  Recommendations for Outpatient Follow-up:  -Will be discharged to ALF today. -Adjust insulin as needed based upon CBGs.   Discharge Diagnoses:  Principal Problem:   DKA (diabetic ketoacidoses) Active Problems:   HTN (hypertension)   Anxiety   Lung cancer   Hypokalemia   Discharge Condition: Stable and improved  Filed Weights   03/10/15 1700 03/11/15 0400  Weight: 70.5 kg (155 lb 6.8 oz) 75.2 kg (165 lb 12.6 oz)    History of present illness:  Patient was sent to the ED today after her HH norse noted her CBGs were elevated. It appears her insulin pen needles were bent and she was likely not getting any insulin. 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 She chronically takes a large amount of xanax and oxycodone. All she tells [place] is she has leg pain/cramps and needs pain medication and xanax. She was noted to be in DKA with a CBG above 600 and was referred for admission.  Hospital Course:   DKA -Resolved. -CBGs well controlled on current regimen. -Suspect cause for DKA was difficulty with injecting insulin.  Anxiety/Depression -Continue home meds  Hypokalemia -Repleted. -Mg level WNL.  H/o Lung Cancer -OP follow up as scheduled with oncology.  Procedures:  None   Consultations:  None  Discharge Instructions  Discharge Instructions    Diet Carb Modified    Complete by:  As directed      Increase activity slowly    Complete by:  As directed             Medication List    STOP taking these medications        benzonatate 100 MG capsule  Commonly known as:  TESSALON     dextromethorphan-guaiFENesin 30-600 MG per 12 hr tablet  Commonly known as:  MUCINEX DM     diphenoxylate-atropine 2.5-0.025 MG per tablet  Commonly known as:  LOMOTIL     lidocaine 2 % jelly  Commonly known as:  XYLOCAINE     nystatin ointment  Commonly known as:  MYCOSTATIN      TAKE these medications        acetaminophen 325 MG tablet  Commonly known as:  TYLENOL  Take 650 mg by mouth every 6 (six) hours as needed for mild pain or moderate pain (TAKES FOR ARM PAIN).     albuterol 108 (90 BASE) MCG/ACT inhaler  Commonly known as:  VENTOLIN HFA  Inhale 2 puffs into the lungs every 6 (six) hours as needed for wheezing or shortness of breath.     alprazolam 2 MG tablet  Commonly known as:  XANAX  Take 1 tablet (2 mg total) by mouth 2 (two) times daily.     aspirin 81 MG chewable tablet  Chew 1 tablet (81 mg total) by mouth daily.     atenolol 50 MG tablet  Commonly known as:  TENORMIN  Take 1 tablet (50 mg total) by mouth daily.     chlorzoxazone 500 MG tablet  Commonly known as:  PARAFON  Take 1 tablet (500 mg total) by mouth 4 (four) times daily.     esomeprazole 40 MG capsule  Commonly known as:  NEXIUM  Take 1 capsule (40 mg total) by mouth daily.     fluticasone 50 MCG/ACT nasal spray  Commonly known as:  FLONASE  Place 1 spray into both nostrils daily as needed for allergies or rhinitis.     insulin aspart 100 UNIT/ML FlexPen  Commonly known as:  NOVOLOG FLEXPEN  Inject 5 Units into the skin 3 (three) times daily with meals.     Insulin Glargine 100 UNIT/ML Solostar Pen  Commonly known as:  LANTUS SOLOSTAR  Inject 20 Units into the skin daily at 10 pm.     levothyroxine 50 MCG tablet  Commonly known as:  SYNTHROID  Take 1.5 tablets (75 mcg total) by mouth daily before breakfast.     lisinopril 5 MG tablet  Commonly known as:  PRINIVIL,ZESTRIL  Take 1 tablet (5 mg total) by mouth daily.     oxyCODONE-acetaminophen 10-325 MG per tablet  Commonly known as:  PERCOCET  Take 1 tablet by mouth every 6 (six) hours as needed for pain.     sertraline 100 MG tablet  Commonly known as:  ZOLOFT  Take 2 tablets (200 mg total) by  mouth daily.     tiotropium 18 MCG inhalation capsule  Commonly known as:  SPIRIVA  Place 1 capsule (18 mcg total) into inhaler and inhale daily.     traZODone 150 MG tablet  Commonly known as:  DESYREL  Take 2 tablets (300 mg total) by mouth at bedtime.       Allergies  Allergen Reactions  . Carafate [Sucralfate] Nausea Only  . Prednisone Other (See Comments)    insomnia  . Nsaids     GI Upset  . Aspirin Other (See Comments)    Gi symptoms. Does NOT take ibuprofen or other NSAIDS  . Aspirin Nausea And Vomiting  . Ibuprofen     Gi upset  . Nsaids Nausea And Vomiting  . Dulera [Mometasone Furo-Formoterol Fum] Palpitations    Panting " my body was twisted inside and out"      The results of significant diagnostics from this hospitalization (including imaging, microbiology, ancillary and laboratory) are listed below for reference.    Significant Diagnostic Studies: Dg Chest 2 View  02/17/2015   CLINICAL DATA:  Cough for several days.  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 The previously noted right infrahilar nodular opacity is not well seen on this radiographic examination. There is patchy bibasilar atelectasis. Lungs elsewhere clear. Heart size and pulmonary vascularity are normal. No adenopathy. There is thoracolumbar dextroscoliosis. Postoperative change is noted in the proximal right humerus with screw and buttress plate fixation. There is bony remodeling in the proximal humeral region, stable. 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 There is postoperative change in the lower cervical spine.  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 Patchy bibasilar atelectasis. Infrahilar lung nodular lesion noted previously is not well seen on this study.  No change in  cardiac silhouette.  No adenopathy appreciable.   Electronically Signed   By: Lowella Grip III M.D.   On: 02/17/2015 16:13   Dg Ankle 2 Views Right  02/27/2015   CLINICAL DATA:  Pain for 3 days  EXAM: RIGHT ANKLE - 2 VIEW  COMPARISON:  None.  FINDINGS: There is no evidence of fracture, dislocation, or joint effusion. There is no evidence of arthropathy or other focal bone abnormality. Soft tissues are  unremarkable.  IMPRESSION: Negative.   Electronically Signed   By: Kerby Moors M.D.   On: 02/27/2015 15:31   Ct Head Wo Contrast  02/26/2015   CLINICAL DATA:  69 year old female found down at home after presumed fall. Seizure-like activity witnessed in the emergency room. Cervical spine imaging could not be performed secondary to excessive patient motion.  EXAM: CT HEAD WITHOUT CONTRAST  TECHNIQUE: Contiguous axial images were obtained from the base of the skull through the vertex without intravenous contrast.  COMPARISON:  Prior CT scan of the head 04/27/2014  FINDINGS: Moderate image degradation secondary to excessive motion artifact. Repeat imaging was attempted without significant improvement. Within these limitations, the study is negative for acute intracranial hemorrhage, acute infarction, mass, mass effect, hydrocephalus or midline shift. Gray-white differentiation is preserved throughout. No acute soft tissue or calvarial abnormality. Normal aeration of the bilateral mastoid air cells and visualized paranasal sinuses.  IMPRESSION: No acute intracranial abnormality within the mild limitations of this motion degraded CT scan of the head.   Electronically Signed   By: Jacqulynn Cadet M.D.   On: 02/26/2015 10:04   Dg Chest Portable 1 View  02/26/2015   CLINICAL DATA:  69 year old female status post fall. Altered mental status and combative. Best imaging obtainable.  EXAM: PORTABLE CHEST - 1 VIEW  COMPARISON:  Prior chest x-ray 02/17/2015  FINDINGS: Cardiac and mediastinal contours are grossly  unchanged compared to recent prior imaging. No overt pulmonary edema, focal airspace consolidation or pneumothorax. Mild bibasilar atelectasis versus scarring persists. Surgical changes of prior ORIF of a right proximal humerus fracture and left shoulder arthroplasty. No definite acute fracture identified.  IMPRESSION: Limited portable radiograph demonstrates no evidence of acute cardiopulmonary process or fracture.   Electronically Signed   By: Jacqulynn Cadet M.D.   On: 02/26/2015 09:38    Microbiology: No results found for this or any previous visit (from the past 240 hour(s)).   Labs: Basic Metabolic Panel:  Recent Labs Lab 03/11/15 0550 03/11/15 1020 03/11/15 1358 03/12/15 0706 03/14/15 2258 03/15/15 0606  NA 136 132* 134* 138  --  138  K 3.6 3.5 3.0* 3.0*  --  4.3  CL 104 100 102 101  --  95*  CO2 22 24 27 29   --  36*  GLUCOSE 122* 247* 237* 90  --  102*  BUN 16 13 12 7   --  6  CREATININE 0.96 0.92 0.97 0.77  --  0.82  CALCIUM 7.7* 7.4* 7.4* 8.0*  --  9.1  MG  --   --   --   --  1.5  --    Liver Function Tests: No results for input(s): AST, ALT, ALKPHOS, BILITOT, PROT, ALBUMIN in the last 168 hours. No results for input(s): LIPASE, AMYLASE in the last 168 hours. No results for input(s): AMMONIA in the last 168 hours. CBC:  Recent Labs Lab 03/10/15 1148 03/12/15 0706  WBC 6.2 5.2  NEUTROABS 5.3  --   HGB 10.9* 11.0*  HCT 32.8* 33.6*  MCV 99.1 100.6*  PLT 276 235   Cardiac Enzymes: No results for input(s): CKTOTAL, CKMB, CKMBINDEX, TROPONINI in the last 168 hours. BNP: BNP (last 3 results)  Recent Labs  02/26/15 0857  BNP 468.0*    ProBNP (last 3 results) No results for input(s): PROBNP in the last 8760 hours.  CBG:  Recent Labs Lab 03/14/15 0745 03/14/15 1156 03/14/15 1644 03/14/15 2031 03/15/15 0801  GLUCAP 174* 130* 217* 82 103*  SignedLelon Frohlich  [facility] Pager: [phone] 03/15/2015, 9:22  AM

## 2015-03-24 ENCOUNTER — Ambulatory Visit (HOSPITAL_COMMUNITY): Payer: Self-pay | Admitting: Psychiatry

## 2015-03-28 ENCOUNTER — Ambulatory Visit (HOSPITAL_COMMUNITY): Payer: Self-pay | Admitting: Psychiatry

## 2015-03-30 ENCOUNTER — Telehealth: Payer: Self-pay | Admitting: *Deleted

## 2015-03-30 NOTE — Telephone Encounter (Signed)
Patient called from [facility] where she is now a resident.  She is very concerned because she hasn't had any treatment for her lung cancer since November, 2015.  She said she is ready to resume nivolumab and wants to see Dr. Julien Nordmann ASAP.  Her phone number at the SNF is [phone].  She said all arrangements for her to come in have to be made through a nurse named Butch Penny at [employer].  She is very concerned that she is going to die if she doesn't resume her treatment.

## 2015-03-30 NOTE — Telephone Encounter (Signed)
Per Dr Julien Nordmann he is referring her to [facility] , Dr Whitney Muse for further care . I spoke to [facility] and she said that would be better since pt is in a facility.

## 2015-03-31 ENCOUNTER — Ambulatory Visit (INDEPENDENT_AMBULATORY_CARE_PROVIDER_SITE_OTHER): Payer: Medicare Other | Admitting: Psychiatry

## 2015-03-31 ENCOUNTER — Encounter (HOSPITAL_COMMUNITY): Payer: Self-pay | Admitting: Psychiatry

## 2015-03-31 VITALS — BP 118/56 | HR 75 | Ht 66.0 in | Wt 158.6 lb

## 2015-03-31 DIAGNOSIS — F411 Generalized anxiety disorder: Secondary | ICD-10-CM

## 2015-03-31 DIAGNOSIS — F331 Major depressive disorder, recurrent, moderate: Secondary | ICD-10-CM

## 2015-03-31 MED ORDER — ALPRAZOLAM 1 MG PO TABS: ORAL_TABLET | ORAL | Status: DC

## 2015-03-31 MED ORDER — TRAZODONE HCL 150 MG PO TABS: 300.0000 mg | ORAL_TABLET | Freq: Every day | ORAL | Status: DC

## 2015-03-31 MED ORDER — SERTRALINE HCL 100 MG PO TABS: 200.0000 mg | ORAL_TABLET | Freq: Every day | ORAL | Status: DC

## 2015-03-31 MED ORDER — ALPRAZOLAM 2 MG PO TABS: 2.0000 mg | ORAL_TABLET | Freq: Two times a day (BID) | ORAL | Status: DC
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
## 2015-04-05 ENCOUNTER — Telehealth: Payer: Self-pay

## 2015-04-05 NOTE — Telephone Encounter (Signed)
Answering pt call from 1329 today. She was coughing up pink this morning. She is at [facility] assisted living. She was asking when her f/u call was with Dr Julien Nordmann. She was concerned that the pink was serious. She was concerned that [facility] was not telling her when her appt was, they tell the residents at breakfast on the morning of the appt. Called pt back. The sputum had cleared up to white. She is getting over bronchitis as well. Explained she has an appt on 4/8 with Dr Whitney Muse at [facility]. Explained that Dr Julien Nordmann felt this was good for the pt. Explained I could not promise this but Dr Whitney Muse would possibly continue the regimen that Dr Julien Nordmann was using b/c it was working. Pt fears were allayed. She was agreeable to see Dr Larey Seat.  Routed to Dr Julien Nordmann.

## 2015-04-07 ENCOUNTER — Encounter (HOSPITAL_COMMUNITY): Payer: Medicare Other | Attending: Hematology & Oncology | Admitting: Hematology & Oncology

## 2015-04-07 ENCOUNTER — Encounter (HOSPITAL_COMMUNITY): Payer: Self-pay | Admitting: Hematology & Oncology

## 2015-04-07 VITALS — BP 153/88 | HR 77 | Temp 99.0°F | Resp 18 | Wt 159.5 lb

## 2015-04-07 DIAGNOSIS — Z720 Tobacco use: Secondary | ICD-10-CM

## 2015-04-07 DIAGNOSIS — E032 Hypothyroidism due to medicaments and other exogenous substances: Secondary | ICD-10-CM | POA: Diagnosis not present

## 2015-04-07 DIAGNOSIS — E038 Other specified hypothyroidism: Secondary | ICD-10-CM

## 2015-04-07 DIAGNOSIS — C3431 Malignant neoplasm of lower lobe, right bronchus or lung: Secondary | ICD-10-CM | POA: Diagnosis present

## 2015-04-07 DIAGNOSIS — C3411 Malignant neoplasm of upper lobe, right bronchus or lung: Secondary | ICD-10-CM | POA: Insufficient documentation

## 2015-04-07 DIAGNOSIS — M549 Dorsalgia, unspecified: Secondary | ICD-10-CM

## 2015-04-07 DIAGNOSIS — R042 Hemoptysis: Secondary | ICD-10-CM | POA: Insufficient documentation

## 2015-04-07 LAB — COMPREHENSIVE METABOLIC PANEL
ALT: 8 U/L (ref 0–35)
AST: 20 U/L (ref 0–37)
Albumin: 3.9 g/dL (ref 3.5–5.2)
Alkaline Phosphatase: 93 U/L (ref 39–117)
Anion gap: 8 (ref 5–15)
BUN: 8 mg/dL (ref 6–23)
CHLORIDE: 98 mmol/L (ref 96–112)
CO2: 29 mmol/L (ref 19–32)
Calcium: 9 mg/dL (ref 8.4–10.5)
Creatinine, Ser: 0.8 mg/dL (ref 0.50–1.10)
GFR calc Af Amer: 86 mL/min — ABNORMAL LOW (ref >90)
GFR calc non Af Amer: 75 mL/min — ABNORMAL LOW (ref >90)
GLUCOSE: 82 mg/dL (ref 70–99)
Potassium: 3.8 mmol/L (ref 3.5–5.1)
Sodium: 135 mmol/L (ref 135–145)
Total Bilirubin: 0.4 mg/dL (ref 0.3–1.2)
Total Protein: 7 g/dL (ref 6.0–8.3)

## 2015-04-07 LAB — CBC WITH DIFFERENTIAL/PLATELET
BASOS ABS: 0 10*3/uL (ref 0.0–0.1)
BASOS PCT: 1 % (ref 0–1)
EOS ABS: 0.1 10*3/uL (ref 0.0–0.7)
Eosinophils Relative: 2 % (ref 0–5)
HCT: 36.2 % (ref 36.0–46.0)
Hemoglobin: 11.9 g/dL — ABNORMAL LOW (ref 12.0–15.0)
Lymphocytes Relative: 26 % (ref 12–46)
Lymphs Abs: 1.2 10*3/uL (ref 0.7–4.0)
MCH: 33.3 pg (ref 26.0–34.0)
MCHC: 32.9 g/dL (ref 30.0–36.0)
MCV: 101.4 fL — ABNORMAL HIGH (ref 78.0–100.0)
Monocytes Absolute: 0.4 10*3/uL (ref 0.1–1.0)
Monocytes Relative: 8 % (ref 3–12)
NEUTROS ABS: 2.9 10*3/uL (ref 1.7–7.7)
NEUTROS PCT: 63 % (ref 43–77)
Platelets: 214 10*3/uL (ref 150–400)
RBC: 3.57 MIL/uL — ABNORMAL LOW (ref 3.87–5.11)
RDW: 13.2 % (ref 11.5–15.5)
WBC: 4.7 10*3/uL (ref 4.0–10.5)

## 2015-04-07 MED ORDER — OXYCODONE-ACETAMINOPHEN 5-325 MG PO TABS: ORAL_TABLET | ORAL | Status: DC

## 2015-04-07 MED ORDER — ONDANSETRON HCL 8 MG PO TABS: 8.0000 mg | ORAL_TABLET | Freq: Three times a day (TID) | ORAL | Status: AC | PRN

## 2015-04-07 MED ORDER — ALBUTEROL SULFATE HFA 108 (90 BASE) MCG/ACT IN AERS: INHALATION_SPRAY | RESPIRATORY_TRACT | Status: AC

## 2015-04-07 MED ORDER — OXYCODONE-ACETAMINOPHEN 5-325 MG PO TABS
2.0000 | ORAL_TABLET | Freq: Once | ORAL | Status: AC
  Administered 2015-04-07: 2 via ORAL
  Filled 2015-04-07: qty 2

## 2015-04-07 MED ORDER — FENTANYL 12 MCG/HR TD PT72
12.0000 ug | MEDICATED_PATCH | TRANSDERMAL | Status: DC
Start: 2015-04-07 — End: 2015-05-04

## 2015-04-07 NOTE — Progress Notes (Signed)
[name] presented for labwork. Labs per MD order drawn via Peripheral Line 23 gauge needle inserted in left hand  Good blood return present. Procedure without incident.  Needle removed intact. Patient tolerated procedure well.  1120 Percocet 5-325 tabs 2 given for complaints of pain in right shoulder and both feet rated at 9 - aching and stabbing. 1158 pain is improving only aching now. 1206 Discharged back to nursing home with CNA

## 2015-04-07 NOTE — Patient Instructions (Signed)
[place] at [facility] Discharge Instructions  RECOMMENDATIONS MADE BY THE CONSULTANT AND ANY TEST RESULTS WILL BE SENT TO YOUR REFERRING PHYSICIAN.  Exam and discussion by Dr. Whitney Muse Need to do scans to see how your disease is. Fentanyl Patches - use as directed - for pain Oxycodone-take as directed-for pain Zofran - take as directed for nausea Albuterol inhaler - use as directed.   Scans on 04/12/15 Office visit after scans.  Thank you for choosing [place] at [facility] to provide your oncology and hematology care.  To afford each patient quality time with our provider, please arrive at least 15 minutes before your scheduled appointment time.    You need to re-schedule your appointment should you arrive 10 or more minutes late.  We strive to give you quality time with our providers, and arriving late affects you and other patients whose appointments are after yours.  Also, if you no show three or more times for appointments you may be dismissed from the clinic at the providers discretion.     Again, thank you for choosing [facility].  Our hope is that these requests will decrease the amount of time that you wait before being seen by our physicians.       _____________________________________________________________  Should you have questions after your visit to [facility], please contact our office at [phone] between the hours of 8:30 a.m. and 4:30 p.m.  Voicemails left after 4:30 p.m. will not be returned until the following business day.  For prescription refill requests, have your pharmacy contact our office.

## 2015-04-11 NOTE — Progress Notes (Signed)
[facility] OFFICE PROGRESS NOTE  [name], [place] [address]  DIAGNOSIS: Metastatic non-small cell lung cancer initially diagnosed as Locally advanced, likely stage IIIA (T2a., N2, M0) non-small cell lung cancer, squamous cell carcinoma diagnosed in September of 2014.   PRIOR THERAPY:  1) Concurrent chemoradiation with weekly carboplatin for AUC of 2 and paclitaxel 45 mg/M2, status post 7 cycles, last cycle was given 11/29/2013 with partial response. First dose was given on 10/18/2013.   2) Systemic chemotherapy with carboplatin for AUC of 5 and paclitaxel 175 mg/M2 every 3 weeks with Neulasta support. First cycle on 04/06/2014. Status post 2 cycles.  CURRENT THERAPY: Immunotherapy with Nivolumab 3 mg/KG every 2 weeks. First cycle 09/27/2014. Last cycle given on 12/09/2014  INTERVAL HISTORY: [name] 69 y.o. female comes to the clinic today for follow up visit of stage IV squamous cell carcinoma of the lung. She was diagnosed with earlier stage disease back in 2014. She was treated in [place]. She has had multiple hospital admissions beginning late last year. She has chronic pain and reportedly issues with pain medications in the past. She is currently residing in a local nursing home. She was admitted to [facility] twice in March. Once for DKA and a second time for presumed benzodiazepine withdrawal seizure.  Her last chemotherapy treatment was given in December of last year. She states she had a lot of transportation issues getting to [facility] and therefore was unable to receive therapy.  She currently resides in a local nursing home. Son is involved in her healthcare. He states it was always difficult to get to [facility] however and her cousin is driving her, intermittently her son could as well. She states she has fallen and broken her left arm, had a shoulder replacement and then fell and broke her right arm. She also states she is  recently broken her foot. She notes that she coughed up pink tinged sputum the other day and that scheduled her, because it is how her cancer presented. She asks for pain medication today during her visit. She states that she does not get appropriate pain medication at the nursing home.  She still smokes up to 2 cigarettes at a time. She goes out on smoking breaks at the NH approximately 4 times a day.  MEDICAL HISTORY: Past Medical History  Diagnosis Date  . H/O: stroke 2012, 2000,     X3  . Anxiety   . PTSD (post-traumatic stress disorder)   . Depression   . SVD (spontaneous vaginal delivery)     x 1  . Smoker   . Osteoarthritis     knees, hips hands,   . Shortness of breath   . Pain     SEVERE PAIN BACK, RIGHT HIP AND KNEES--HARRINGTON RODS AND CERVICAL PLATES--USES WALKER OR CANE WHEN AMBULATING - GOES TO A PAIN CLINIC-STATES SHE NEEDS RT HIP AND BOTH KNEES REPLACED. PT STATES SHE WAS TOLD THE CEMENT AROUND THE HARRINGTON RODS IS CRACKED.  [name] Hot flashes, menopausal     SEVERE  . Complication of anesthesia 03/02/13    severe headache,vertigo postop  . COPD (chronic obstructive pulmonary disease)     stable  . 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 Lung cancer   . Status post radiation therapy within four to twelve weeks 10/18/13-12/06/13    lung ca  66Gy  . Family history of anesthesia complication     equipment failure  caused problem, suffered a stroke & heartattack  . Diabetes mellitus, type II   . Diabetes mellitus without complication   . Cancer   . Hypertension   . COPD (chronic obstructive pulmonary disease)     ALLERGIES:  is allergic to carafate; prednisone; nsaids; aspirin; aspirin; ibuprofen; nsaids; and dulera.  MEDICATIONS:  Current Outpatient Prescriptions  Medication Sig Dispense Refill  . acetaminophen (TYLENOL) 325 MG tablet Take 650 mg by mouth every 6 (six) hours as needed for mild pain or moderate pain (TAKES FOR ARM PAIN).     [name] albuterol (VENTOLIN HFA)  108 (90 BASE) MCG/ACT inhaler Inhale 2 puffs into the lungs every 6 (six) hours as needed for wheezing or shortness of breath. 1 Inhaler 0  . ALPRAZolam (XANAX) 1 MG tablet Take one at 3 pm 30 tablet 2  . alprazolam (XANAX) 2 MG tablet Take 1 tablet (2 mg total) by mouth 2 (two) times daily. 60 tablet 2  . ANORO ELLIPTA 62.5-25 MCG/INH AEPB Inhale 1 puff into the lungs daily.    [name] atenolol (TENORMIN) 50 MG tablet Take 1 tablet (50 mg total) by mouth daily. 30 tablet 0  . chlorzoxazone (PARAFON) 500 MG tablet Take 1 tablet (500 mg total) by mouth 4 (four) times daily. 120 tablet 0  . esomeprazole (NEXIUM) 40 MG capsule Take 1 capsule (40 mg total) by mouth daily. 30 capsule 0  . fluticasone (FLONASE) 50 MCG/ACT nasal spray Place 1 spray into both nostrils daily as needed for allergies or rhinitis. 16 g 0  . insulin aspart (NOVOLOG FLEXPEN) 100 UNIT/ML FlexPen Inject 5 Units into the skin 3 (three) times daily with meals. 15 mL 11  . Insulin Glargine (LANTUS SOLOSTAR) 100 UNIT/ML Solostar Pen Inject 20 Units into the skin daily at 10 pm. (Patient taking differently: Inject 22 Units into the skin daily at 10 pm. ) 15 mL 11  . Insulin Pen Needle (NOVOFINE) 30G X 8 MM MISC Inject 1 packet into the skin as needed.    [name] levothyroxine (SYNTHROID) 50 MCG tablet Take 1.5 tablets (75 mcg total) by mouth daily before breakfast. 30 tablet 1  . lisinopril (PRINIVIL,ZESTRIL) 5 MG tablet Take 1 tablet (5 mg total) by mouth daily. 30 tablet 0  . sertraline (ZOLOFT) 100 MG tablet Take 2 tablets (200 mg total) by mouth daily. 60 tablet 2  . traZODone (DESYREL) 150 MG tablet Take 2 tablets (300 mg total) by mouth at bedtime. 60 tablet 2  . albuterol (PROVENTIL HFA;VENTOLIN HFA) 108 (90 BASE) MCG/ACT inhaler 2 puffs every 4 - 6 hours as needed for dyspnea 1 Inhaler 2  . fentaNYL (DURAGESIC - DOSED MCG/HR) 12 MCG/HR Place 1 patch (12.5 mcg total) onto the skin every 3 (three) days. 10 patch 0  . ondansetron (ZOFRAN) 8 MG  tablet Take 1 tablet (8 mg total) by mouth every 8 (eight) hours as needed for nausea or vomiting. 30 tablet 2  . oxyCODONE-acetaminophen (PERCOCET/ROXICET) 5-325 MG per tablet Take 1 tablet every 4 hours as needed for pain 40 tablet 0  . tiotropium (SPIRIVA) 18 MCG inhalation capsule Place 1 capsule (18 mcg total) into inhaler and inhale daily. (Patient not taking: Reported on 04/07/2015) 30 capsule 12   No current facility-administered medications for this visit.    SURGICAL HISTORY:  Past Surgical History  Procedure Laterality Date  . Carpal tunnel release      BILATERAL  . Nasal sinus surgery    . Lumbar fusion  L4-S1  . Cervical fusion    . Hernia repair    . Salpingoophorectomy Right   . Vaginal hysterectomy      TAH w/ ovary removal  . Transthoracic echocardiogram  12-03-2011    LVSF NORMAL/ EF 60-65%  . Vulvectomy N/A 03/02/2013    Procedure: WIDE LOCAL EXCISION VULVAR;  Surgeon: Imagene Gurney A. Alycia Rossetti, MD;  Location: [facility];  Service: Gynecology;  Laterality: N/A;  . Co2 laser application N/A 9/47/0962    Procedure: LASER APPLICATION OF THE VULVA;  Surgeon: Janie Morning, MD;  Location: [facility];  Service: Gynecology;  Laterality: N/A;  . Video bronchoscopy Bilateral 09/23/2013    Procedure: VIDEO BRONCHOSCOPY WITHOUT FLUORO;  Surgeon: Tanda Rockers, MD;  Location: [facility];  Service: Cardiopulmonary;  Laterality: Bilateral;  . Hemiarthroplasty shoulder fracture Left 05/25/2014    DR CHANDLER  . Reverse shoulder arthroplasty Left 05/24/2014    Procedure:   Hemi-Arthroplasty  Left Shoulder;  Surgeon: Nita Sells, MD;  Location: [facility];  Service: Orthopedics;  Laterality: Left;  . Orif humerus fracture Right 12/12/2014    Procedure: OPEN REDUCTION INTERNAL FIXATION (ORIF) PROXIMAL HUMERUS FRACTURE;  Surgeon: Nita Sells, MD;  Location: [facility];  Service: Orthopedics;  Laterality: Right;    REVIEW OF SYSTEMS:  Constitutional: positive for  fatigue Eyes: negative Ears, nose, mouth, throat, and face: negative Respiratory: positive for cough, dyspnea on exertion and sputum Cardiovascular: negative Gastrointestinal: negative Genitourinary:negative Integument/breast: negative Hematologic/lymphatic: negative Musculoskeletal:positive for muscle weakness Neurological: negative Behavioral/Psych: negative Endocrine: negative Allergic/Immunologic: negative   PHYSICAL EXAMINATION:  General appearance: alert, cooperative, fatigued and no distress In a wheelchair Head: Normocephalic, without obvious abnormality, atraumatic Neck: no adenopathy, no JVD, supple, symmetrical, trachea midline and thyroid not enlarged, symmetric, no tenderness/mass/nodules Lymph nodes: Cervical, supraclavicular, and axillary nodes normal. Resp: wheezes bilaterally, occasional coarse rhonchi Back: symmetric, no curvature. ROM normal. No CVA tenderness. Cardio: regular rate and rhythm, S1, S2 normal, no murmur, click, rub or gallop GI: soft, non-tender; bowel sounds normal; no masses,  no organomegaly Extremities: extremities normal, atraumatic, no cyanosis. Bilateral LE edema Neurologic: Alert and oriented X 3,    ECOG PERFORMANCE STATUS: 2 - Symptomatic, <50% confined to bed  Blood pressure 153/88, pulse 77, temperature 99 F (37.2 C), temperature source Oral, resp. rate 18, weight 159 lb 8 oz (72.349 kg), SpO2 96 %.  LABORATORY DATA: Results for RILEIGH, KAWASHIMA (MRN [id number]) as of 05/02/2015 13:47  Ref. Range 04/07/2015 11:45  Sodium Latest Ref Range: 135-145 mmol/L 135  Potassium Latest Ref Range: 3.5-5.1 mmol/L 3.8  Chloride Latest Ref Range: 96-112 mmol/L 98  CO2 Latest Ref Range: 19-32 mmol/L 29  BUN Latest Ref Range: 6-23 mg/dL 8  Creatinine Latest Ref Range: 0.50-1.10 mg/dL 0.80  Calcium Latest Ref Range: 8.4-10.5 mg/dL 9.0  EGFR (Non-African Amer.) Latest Ref Range: >90 mL/min 75 (L)  EGFR (African American) Latest Ref Range: >90 mL/min  86 (L)  Glucose Latest Ref Range: 70-99 mg/dL 82  Anion gap Latest Ref Range: 5-15  8  Alkaline Phosphatase Latest Ref Range: 39-117 U/L 93  Albumin Latest Ref Range: 3.5-5.2 g/dL 3.9  AST Latest Ref Range: 0-37 U/L 20  ALT Latest Ref Range: 0-35 U/L 8  Total Protein Latest Ref Range: 6.0-8.3 g/dL 7.0  Total Bilirubin Latest Ref Range: 0.3-1.2 mg/dL 0.4  WBC Latest Ref Range: 4.0-10.5 K/uL 4.7  RBC Latest Ref Range: 3.87-5.11 MIL/uL 3.57 (L)  Hemoglobin Latest Ref Range: 12.0-15.0 g/dL 11.9 (L)  HCT Latest Ref Range: 36.0-46.0 % 36.2  MCV Latest Ref Range: 78.0-100.0 fL 101.4 (H)  MCH Latest Ref Range: 26.0-34.0 pg 33.3  MCHC Latest Ref Range: 30.0-36.0 g/dL 32.9  RDW Latest Ref Range: 11.5-15.5 % 13.2  Platelets Latest Ref Range: 150-400 K/uL 214  Neutrophils Latest Ref Range: 43-77 % 63  Lymphocytes Latest Ref Range: 12-46 % 26  Monocytes Relative Latest Ref Range: 3-12 % 8  Eosinophil Latest Ref Range: 0-5 % 2  Basophil Latest Ref Range: 0-1 % 1  NEUT# Latest Ref Range: 1.7-7.7 K/uL 2.9  Lymphocyte # Latest Ref Range: 0.7-4.0 K/uL 1.2  Monocyte # Latest Ref Range: 0.1-1.0 K/uL 0.4  Eosinophils Absolute Latest Ref Range: 0.0-0.7 K/uL 0.1  Basophils Absolute Latest Ref Range: 0.0-0.1 K/uL 0.0    ASSESSMENT AND PLAN: This is a very pleasant 69 years old white female with:  1) Stage IIIA non-small cell lung cancer completed a course of concurrent chemoradiation with weekly carboplatin and paclitaxel and tolerating her treatment fairly well. She completed 2 more cycles of systemic chemotherapy with carboplatin and paclitaxel every 3 weeks but her treatment was discontinued after the patient had surgery for the left shoulder She had evidence for disease progression and the patient was started on treatment with immunotherapy with Nivolumab 3 MG/KG and tolerating her treatment fairly well. She had imaging after 4 cycles with stability of disease.  Secondary to transportation issues and  hospitalizations she has had no additional therapy since 12/09/2014.   She has not had restaging in several months. Since she has been off treatment I have recommended repeat staging studies to reassess her disease burden. We will regroup after her repeat imaging and discuss additional therapy. She wishes to restart Nivolumab.    2) chronic back pain: She apparently has issues with seeking pain medication. I have discussed with her getting her to a pain physician, she states she cannot get to [facility]. I have advised her that her pain physicians in [place]. We will start her on a Duragesic patch, readjust her pain medications at the nursing home and make a pain referral at her next visit.  3) immune mediated hypothyroidism: We will continue to monitor her TSH. If we feel restarting Nivolumab is appropriate TSH will be monitored in accordance with drug guidenlines.  4) smoking cessation: So she is simply not willing to stop smoking. She has certainly cut back now that she is a resident of a local nursing facility. We will continue to work with her on this issue moving forward.  She was advised to call immediately if she has any concerning symptoms in the interval. All questions were answered. The patient knows to call the clinic with any problems, questions or concerns. We can certainly see the patient much sooner if necessary.  Disclaimer: This note was dictated with voice recognition software. Similar sounding words can inadvertently be transcribed and may not be corrected upon review.   Molli Hazard, MD

## 2015-04-12 ENCOUNTER — Other Ambulatory Visit (HOSPITAL_COMMUNITY): Payer: Self-pay

## 2015-04-13 ENCOUNTER — Ambulatory Visit (HOSPITAL_COMMUNITY)
Admission: RE | Admit: 2015-04-13 | Discharge: 2015-04-13 | Disposition: A | Discharge status: Home / Self Care | Payer: Medicare Other | Source: Ambulatory Visit | Attending: Hematology & Oncology | Admitting: Hematology & Oncology

## 2015-04-13 DIAGNOSIS — R042 Hemoptysis: Secondary | ICD-10-CM | POA: Diagnosis not present

## 2015-04-13 DIAGNOSIS — R079 Chest pain, unspecified: Secondary | ICD-10-CM | POA: Diagnosis not present

## 2015-04-13 DIAGNOSIS — Z9221 Personal history of antineoplastic chemotherapy: Secondary | ICD-10-CM | POA: Diagnosis not present

## 2015-04-13 DIAGNOSIS — C3411 Malignant neoplasm of upper lobe, right bronchus or lung: Secondary | ICD-10-CM | POA: Diagnosis not present

## 2015-04-13 DIAGNOSIS — R0602 Shortness of breath: Secondary | ICD-10-CM | POA: Diagnosis not present

## 2015-04-13 DIAGNOSIS — Z923 Personal history of irradiation: Secondary | ICD-10-CM | POA: Diagnosis not present

## 2015-04-13 MED ORDER — IOHEXOL 300 MG/ML  SOLN
100.0000 mL | Freq: Once | INTRAMUSCULAR | Status: AC | PRN
  Administered 2015-04-13: 100 mL via INTRAVENOUS

## 2015-04-13 NOTE — Progress Notes (Signed)
[name] spoke with [name] med tech at [facility] regarding pt extravasation in left wrist. [name] asked [name] to keep an eye on wrist and gave her our number incase it got worse or she had any questions or concerns. Dr. Thornton Papas looked at area of interest and said it was 7ml or less infiltrate. Pt went home with small ice bag on wrist.

## 2015-04-14 ENCOUNTER — Encounter (HOSPITAL_COMMUNITY): Payer: Self-pay | Admitting: Hematology & Oncology

## 2015-04-14 ENCOUNTER — Encounter (HOSPITAL_BASED_OUTPATIENT_CLINIC_OR_DEPARTMENT_OTHER): Payer: Medicare Other | Admitting: Hematology & Oncology

## 2015-04-14 VITALS — BP 155/84 | HR 84 | Temp 99.4°F | Resp 24 | Wt 160.6 lb

## 2015-04-14 DIAGNOSIS — Z720 Tobacco use: Secondary | ICD-10-CM | POA: Diagnosis not present

## 2015-04-14 DIAGNOSIS — M539 Dorsopathy, unspecified: Secondary | ICD-10-CM

## 2015-04-14 DIAGNOSIS — E039 Hypothyroidism, unspecified: Secondary | ICD-10-CM

## 2015-04-14 DIAGNOSIS — C3492 Malignant neoplasm of unspecified part of left bronchus or lung: Secondary | ICD-10-CM

## 2015-04-14 NOTE — Patient Instructions (Addendum)
[place] at [facility] Discharge Instructions  RECOMMENDATIONS MADE BY THE CONSULTANT AND ANY TEST RESULTS WILL BE SENT TO YOUR REFERRING PHYSICIAN.  Exam and discussion by Dr. Whitney Muse. New prescription for Duragesic patch increase. Chemo treatment to be started after port-a-cath is placed. Interventional Radiology will call [facility] to schedule appointment for port-a-cath placement. Chemo teaching with Lupita Raider, RN will be scheduled after port is placed. We will call to schedule that teaching.  A [facility] faculty member needs to attend this teaching with you.   A referral has been sent to Dr. Merlene Laughter for back pain.   Thank you for choosing [place] at [facility] to provide your oncology and hematology care.  To afford each patient quality time with our provider, please arrive at least 15 minutes before your scheduled appointment time.    You need to re-schedule your appointment should you arrive 10 or more minutes late.  We strive to give you quality time with our providers, and arriving late affects you and other patients whose appointments are after yours.  Also, if you no show three or more times for appointments you may be dismissed from the clinic at the providers discretion.     Again, thank you for choosing [facility].  Our hope is that these requests will decrease the amount of time that you wait before being seen by our physicians.       _____________________________________________________________  Should you have questions after your visit to [facility], please contact our office at [phone] between the hours of 8:30 a.m. and 4:30 p.m.  Voicemails left after 4:30 p.m. will not be returned until the following business day.  For prescription refill requests, have your pharmacy contact our office.

## 2015-04-14 NOTE — Progress Notes (Signed)
[facility] OFFICE PROGRESS NOTE  [name], [place] [address]  DIAGNOSIS: Metastatic non-small cell lung cancer initially diagnosed as Locally advanced, likely stage IIIA (T2a., N2, M0) non-small cell lung cancer, squamous cell carcinoma diagnosed in September of 2014.   PRIOR THERAPY:  1) Concurrent chemoradiation with weekly carboplatin for AUC of 2 and paclitaxel 45 mg/M2, status post 7 cycles, last cycle was given 11/29/2013 with partial response. First dose was given on 10/18/2013.   2) Systemic chemotherapy with carboplatin for AUC of 5 and paclitaxel 175 mg/M2 every 3 weeks with Neulasta support. First cycle on 04/06/2014. Status post 2 cycles.  CURRENT THERAPY: Immunotherapy with Nivolumab 3 mg/KG every 2 weeks. First cycle 09/27/2014. Last cycle given on 12/09/2014  INTERVAL HISTORY: [name] 69 y.o. female returns to the clinic today for follow up visit of stage IV squamous cell carcinoma of the lung. She was diagnosed with earlier stage disease back in 2014. She was treated in [place]. She has had multiple hospital admissions beginning late last year. She has chronic pain and reportedly issues with pain medications in the past. She is currently residing in a local nursing home. She was admitted to [facility] twice in March. Once for DKA and a second time for presumed benzodiazepine withdrawal seizure.  She is here today to review her repeat staging studies. In to discuss additional treatment of her lung cancer. She again emphasizes she wants to restart her prior medication that she was taking last year because it was working and she tolerated it well.  She is adamant about not getting a port although she has very poor venous access.   MEDICAL HISTORY: Past Medical History  Diagnosis Date  . H/O: stroke 2012, 2000,     X3  . Anxiety   . PTSD (post-traumatic stress disorder)   . Depression   . SVD (spontaneous vaginal  delivery)     x 1  . Smoker   . Osteoarthritis     knees, hips hands,   . Shortness of breath   . Pain     SEVERE PAIN BACK, RIGHT HIP AND KNEES--HARRINGTON RODS AND CERVICAL PLATES--USES WALKER OR CANE WHEN AMBULATING - GOES TO A PAIN CLINIC-STATES SHE NEEDS RT HIP AND BOTH KNEES REPLACED. PT STATES SHE WAS TOLD THE CEMENT AROUND THE HARRINGTON RODS IS CRACKED.  [name] Hot flashes, menopausal     SEVERE  . Complication of anesthesia 03/02/13    severe headache,vertigo postop  . COPD (chronic obstructive pulmonary disease)     stable  . 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 Lung cancer   . Status post radiation therapy within four to twelve weeks 10/18/13-12/06/13    lung ca  66Gy  . Family history of anesthesia complication     equipment failure caused problem, suffered a stroke & heartattack  . Diabetes mellitus, type II   . Diabetes mellitus without complication   . Cancer   . Hypertension   . COPD (chronic obstructive pulmonary disease)     ALLERGIES:  is allergic to carafate; prednisone; nsaids; aspirin; aspirin; ibuprofen; nsaids; and dulera.  MEDICATIONS:  Current Outpatient Prescriptions  Medication Sig Dispense Refill  . acetaminophen (TYLENOL) 325 MG tablet Take 650 mg by mouth every 6 (six) hours as needed for mild pain or moderate pain (TAKES FOR ARM PAIN).     [name] albuterol (PROVENTIL HFA;VENTOLIN HFA) 108 (90 BASE) MCG/ACT inhaler 2 puffs  every 4 - 6 hours as needed for dyspnea 1 Inhaler 2  . albuterol (VENTOLIN HFA) 108 (90 BASE) MCG/ACT inhaler Inhale 2 puffs into the lungs every 6 (six) hours as needed for wheezing or shortness of breath. 1 Inhaler 0  . ALPRAZolam (XANAX) 1 MG tablet Take one at 3 pm 30 tablet 2  . alprazolam (XANAX) 2 MG tablet Take 1 tablet (2 mg total) by mouth 2 (two) times daily. 60 tablet 2  . ANORO ELLIPTA 62.5-25 MCG/INH AEPB Inhale 1 puff into the lungs daily.    [name] atenolol (TENORMIN) 50 MG tablet Take 1 tablet (50 mg total) by mouth daily. 30  tablet 0  . chlorzoxazone (PARAFON) 500 MG tablet Take 1 tablet (500 mg total) by mouth 4 (four) times daily. 120 tablet 0  . esomeprazole (NEXIUM) 40 MG capsule Take 1 capsule (40 mg total) by mouth daily. 30 capsule 0  . fentaNYL (DURAGESIC - DOSED MCG/HR) 12 MCG/HR Place 1 patch (12.5 mcg total) onto the skin every 3 (three) days. 10 patch 0  . fluticasone (FLONASE) 50 MCG/ACT nasal spray Place 1 spray into both nostrils daily as needed for allergies or rhinitis. 16 g 0  . insulin aspart (NOVOLOG FLEXPEN) 100 UNIT/ML FlexPen Inject 5 Units into the skin 3 (three) times daily with meals. 15 mL 11  . Insulin Glargine (LANTUS SOLOSTAR) 100 UNIT/ML Solostar Pen Inject 20 Units into the skin daily at 10 pm. (Patient taking differently: Inject 22 Units into the skin daily at 10 pm. ) 15 mL 11  . Insulin Pen Needle (NOVOFINE) 30G X 8 MM MISC Inject 1 packet into the skin as needed.    [name] levothyroxine (SYNTHROID) 50 MCG tablet Take 1.5 tablets (75 mcg total) by mouth daily before breakfast. 30 tablet 1  . lisinopril (PRINIVIL,ZESTRIL) 5 MG tablet Take 1 tablet (5 mg total) by mouth daily. 30 tablet 0  . ondansetron (ZOFRAN) 8 MG tablet Take 1 tablet (8 mg total) by mouth every 8 (eight) hours as needed for nausea or vomiting. 30 tablet 2  . oxyCODONE-acetaminophen (PERCOCET) 10-325 MG per tablet 10-325 tablets.    [name] oxyCODONE-acetaminophen (PERCOCET/ROXICET) 5-325 MG per tablet Take 1 tablet every 4 hours as needed for pain 40 tablet 0  . sertraline (ZOLOFT) 100 MG tablet Take 2 tablets (200 mg total) by mouth daily. 60 tablet 2  . tiotropium (SPIRIVA) 18 MCG inhalation capsule Place 1 capsule (18 mcg total) into inhaler and inhale daily. 30 capsule 12  . traZODone (DESYREL) 150 MG tablet Take 2 tablets (300 mg total) by mouth at bedtime. 60 tablet 2   No current facility-administered medications for this visit.    SURGICAL HISTORY:  Past Surgical History  Procedure Laterality Date  . Carpal tunnel  release      BILATERAL  . Nasal sinus surgery    . Lumbar fusion      L4-S1  . Cervical fusion    . Hernia repair    . Salpingoophorectomy Right   . Vaginal hysterectomy      TAH w/ ovary removal  . Transthoracic echocardiogram  12-03-2011    LVSF NORMAL/ EF 60-65%  . Vulvectomy N/A 03/02/2013    Procedure: WIDE LOCAL EXCISION VULVAR;  Surgeon: Imagene Gurney A. Alycia Rossetti, MD;  Location: [facility];  Service: Gynecology;  Laterality: N/A;  . Co2 laser application N/A 9/92/4268    Procedure: LASER APPLICATION OF THE VULVA;  Surgeon: Janie Morning, MD;  [facility];  Service: Gynecology;  Laterality: N/A;  . Video bronchoscopy Bilateral 09/23/2013    Procedure: VIDEO BRONCHOSCOPY WITHOUT FLUORO;  Surgeon: Tanda Rockers, MD;  Location: [facility];  Service: Cardiopulmonary;  Laterality: Bilateral;  . Hemiarthroplasty shoulder fracture Left 05/25/2014    DR CHANDLER  . Reverse shoulder arthroplasty Left 05/24/2014    Procedure:   Hemi-Arthroplasty  Left Shoulder;  Surgeon: Nita Sells, MD;  Location: [place];  Service: Orthopedics;  Laterality: Left;  . Orif humerus fracture Right 12/12/2014    Procedure: OPEN REDUCTION INTERNAL FIXATION (ORIF) PROXIMAL HUMERUS FRACTURE;  Surgeon: Nita Sells, MD;  Location: [place];  Service: Orthopedics;  Laterality: Right;    REVIEW OF SYSTEMS:  Constitutional: positive for fatigue, complains of pain "all over" and in chest Eyes: negative Ears, nose, mouth, throat, and face: negative Respiratory: positive for cough, dyspnea on exertion and sputum Cardiovascular: negative, swelling in her feet Gastrointestinal: occasional nausea Genitourinary:negative Integument/breast: negative Hematologic/lymphatic: negative Musculoskeletal:positive for muscle weakness Neurological: negative Behavioral/Psych: negative Endocrine: negative Allergic/Immunologic: negative   PHYSICAL EXAMINATION: General appearance: alert, cooperative,  fatigued and no distress Sitting in a wheelchair Head: Normocephalic, without obvious abnormality, atraumatic Neck: no adenopathy, no JVD, supple, symmetrical, trachea midline and thyroid not enlarged, symmetric, no tenderness/mass/nodules Lymph nodes: Cervical, supraclavicular, and axillary nodes normal. Resp: wheezes bilaterally Back: symmetric, no curvature. ROM normal. No CVA tenderness. Cardio: regular rate and rhythm, S1, S2 normal, no murmur, click, rub or gallop GI: soft, non-tender; bowel sounds normal; no masses,  no organomegaly Extremities: extremities normal, atraumatic, no cyanosis or edema Neurologic: Alert and oriented X 3,   ECOG PERFORMANCE STATUS: 2 - Symptomatic, <50% confined to bed  Blood pressure 155/84, pulse 84, temperature 99.4 F (37.4 C), temperature source Oral, resp. rate 24, weight 160 lb 9.6 oz (72.848 kg), SpO2 93 %.  LABORATORY DATA: Lab Results  Component Value Date   WBC 4.7 04/07/2015   HGB 11.9* 04/07/2015   HCT 36.2 04/07/2015   MCV 101.4* 04/07/2015   PLT 214 04/07/2015      Chemistry      Component Value Date/Time   NA 135 04/07/2015 1145   NA 134* 12/09/2014 1100   K 3.8 04/07/2015 1145   K 4.2 12/09/2014 1100   CL 98 04/07/2015 1145   CO2 29 04/07/2015 1145   CO2 27 12/09/2014 1100   BUN 8 04/07/2015 1145   BUN 8.8 12/09/2014 1100   CREATININE 0.80 04/07/2015 1145   CREATININE 0.9 12/09/2014 1100      Component Value Date/Time   CALCIUM 9.0 04/07/2015 1145   CALCIUM 8.4 12/09/2014 1100   ALKPHOS 93 04/07/2015 1145   ALKPHOS 138 12/09/2014 1100   AST 20 04/07/2015 1145   AST 15 12/09/2014 1100   ALT 8 04/07/2015 1145   ALT 12 12/09/2014 1100   BILITOT 0.4 04/07/2015 1145   BILITOT 0.52 12/09/2014 1100       RADIOGRAPHIC STUDIES: Ct Chest W Contrast  04/13/2015   CLINICAL DATA:  Followup right upper lobe non-small cell lung carcinoma. Previous radiation therapy and chemotherapy. Hemoptysis. Chest pain and shortness of  breath.  EXAM: CT CHEST AND ABDOMEN WITH CONTRAST  TECHNIQUE: Multidetector CT imaging of the chest and abdomen was performed following the standard protocol during bolus administration of intravenous contrast.  CONTRAST:  142mL OMNIPAQUE IOHEXOL 300 MG/ML  SOLN  COMPARISON:  Chest CT on 11/21/2014 and abdomen CT on 09/15/2014  FINDINGS: CT CHEST FINDINGS  Mediastinum/Lymph Nodes: Evaluation of hilar  and mediastinal structures is suboptimal due to lack of IV contrast on this exam. New obstruction of the right bronchus intermedius is seen with increased size of a right infrahilar soft tissue density, which currently measures 2.2 x 3.0 cm on image 36 series 3, compared to approximately 1.3 x 1.5 cm previously. Other small mediastinal lymph nodes in the right paratracheal and AP window regions show no significant change, measuring up to 10 mm on image 25/series 3.  Lungs/Pleura: Increased size of moderate right pleural effusion is seen. There is increased airspace disease, greatest in the central right middle and lower lobes. Differential diagnosis includes postobstructive pneumonitis, radiation pneumonitis, and infection. Some new patchy airspace disease is also seen in the inferior lingula and left lower lobe, which could be due to infection or drug reaction.  Musculoskeletal/Soft Tissues: No suspicious bone lesions. Anterior dislocation of right shoulder is seen which is new since previous study and there has been interval placement of internal fixation hardware in the proximal right humerus.  CT ABDOMEN FINDINGS  Hepatobiliary: No masses or other significant abnormality identified.  Pancreas: No mass, inflammatory changes, or other significant abnormality identified.  Spleen:  Within normal limits in size and appearance.  Adrenals:  No masses identified.  Kidneys/Urinary Tract:  No evidence of masses or hydronephrosis.  Stomach/Bowel/Peritoneum: No evidence of wall thickening, mass, or obstruction.   Vascular/Lymphatic: No pathologically enlarged lymph nodes identified. No other significant abnormality visualized.  Other:  None.  Musculoskeletal:  No suspicious bone lesions identified.  IMPRESSION: Increased size of right infrahilar soft tissue density and obstruction of bronchus intermedius, suspicious for recurrent carcinoma.  Increased moderate right pleural effusion, and airspace disease predominantly in the central right middle and lower lobes. Differential diagnosis includes postobstructive pneumonitis, radiation pneumonitis, and infection.  Patchy areas of airspace disease in the inferior lingula and left lower lobe are suspicious for infection or drug reaction.  Stable shotty mediastinal lymph nodes.  Anterior dislocation of right shoulder, with internal fixation hardware seen in proximal right humerus.  No evidence of abdominal metastatic disease.   Electronically Signed   By: Earle Gell M.D.   On: 04/13/2015 16:06   Ct Abdomen W Contrast  04/13/2015   CLINICAL DATA:  Followup right upper lobe non-small cell lung carcinoma. Previous radiation therapy and chemotherapy. Hemoptysis. Chest pain and shortness of breath.  EXAM: CT CHEST AND ABDOMEN WITH CONTRAST  TECHNIQUE: Multidetector CT imaging of the chest and abdomen was performed following the standard protocol during bolus administration of intravenous contrast.  CONTRAST:  146mL OMNIPAQUE IOHEXOL 300 MG/ML  SOLN  COMPARISON:  Chest CT on 11/21/2014 and abdomen CT on 09/15/2014  FINDINGS: CT CHEST FINDINGS  Mediastinum/Lymph Nodes: Evaluation of hilar and mediastinal structures is suboptimal due to lack of IV contrast on this exam. New obstruction of the right bronchus intermedius is seen with increased size of a right infrahilar soft tissue density, which currently measures 2.2 x 3.0 cm on image 36 series 3, compared to approximately 1.3 x 1.5 cm previously. Other small mediastinal lymph nodes in the right paratracheal and AP window regions show  no significant change, measuring up to 10 mm on image 25/series 3.  Lungs/Pleura: Increased size of moderate right pleural effusion is seen. There is increased airspace disease, greatest in the central right middle and lower lobes. Differential diagnosis includes postobstructive pneumonitis, radiation pneumonitis, and infection. Some new patchy airspace disease is also seen in the inferior lingula and left lower lobe, which could be due  to infection or drug reaction.  Musculoskeletal/Soft Tissues: No suspicious bone lesions. Anterior dislocation of right shoulder is seen which is new since previous study and there has been interval placement of internal fixation hardware in the proximal right humerus.  CT ABDOMEN FINDINGS  Hepatobiliary: No masses or other significant abnormality identified.  Pancreas: No mass, inflammatory changes, or other significant abnormality identified.  Spleen:  Within normal limits in size and appearance.  Adrenals:  No masses identified.  Kidneys/Urinary Tract:  No evidence of masses or hydronephrosis.  Stomach/Bowel/Peritoneum: No evidence of wall thickening, mass, or obstruction.  Vascular/Lymphatic: No pathologically enlarged lymph nodes identified. No other significant abnormality visualized.  Other:  None.  Musculoskeletal:  No suspicious bone lesions identified.  IMPRESSION: Increased size of right infrahilar soft tissue density and obstruction of bronchus intermedius, suspicious for recurrent carcinoma.  Increased moderate right pleural effusion, and airspace disease predominantly in the central right middle and lower lobes. Differential diagnosis includes postobstructive pneumonitis, radiation pneumonitis, and infection.  Patchy areas of airspace disease in the inferior lingula and left lower lobe are suspicious for infection or drug reaction.  Stable shotty mediastinal lymph nodes.  Anterior dislocation of right shoulder, with internal fixation hardware seen in proximal right  humerus.  No evidence of abdominal metastatic disease.   Electronically Signed   By: Earle Gell M.D.   On: 04/13/2015 16:06    ASSESSMENT AND PLAN: This is a very pleasant 69 years old white female with:  1) Stage IIIA non-small cell lung cancer completed a course of concurrent chemoradiation with weekly carboplatin and paclitaxel and tolerating her treatment fairly well. She completed 2 more cycles of systemic chemotherapy with carboplatin and paclitaxel every 3 weeks but her treatment was discontinued after the patient had surgery for the left shoulder She had evidence for disease progression and the patient was started on treatment with immunotherapy with Nivolumab 3 MG/KG and tolerating her treatment fairly well. Restaging studies late last year showed stability of disease.   She was unable to get back to [facility] for multiple reasons and now resides in a local nursing home. She is interested in restarting therapy. I reviewed her CT scans above with her and advised her she has had progression of disease within the chest.   She is adamant on restarting Nivolumab, I advised her that this is not unreasonable however given that she has had the drug in the past and has had such a long break from therapy we may not get the response we desire. He also emphasized to her the need for port placement as nursing does not feel able to get good IV access. She was adamantly refusing port placement and I had our chemotherapy nurses spent time discussing her access issues. She is now willing to proceed.    2) chronic back pain: The patient has this issues for over 30 years before her diagnosis with lung cancer. I advised her we will prescribing her pain medication for her chest pain and have also advised her that if it continues to be an issue we will refer her to the pain clinic in [place].   3) immune mediated hypothyroidism:  She has developed anemia mediated hypothyroidism and we will continue to monitor her  thyroid function moving forward. We will adjust her medication as needed.  4) smoke cessation: I strongly encouraged the patient to quit smoking and offered her to smoke cessation program. He is not willing to quit although she states she has cut back  significantly since living at the nursing home.  She was advised to call immediately if she has any concerning symptoms in the interval. The patient voices understanding of current disease status and treatment options and is in agreement with the current care plan.  All questions were answered. The patient knows to call the clinic with any problems, questions or concerns. We can certainly see the patient much sooner if necessary. This note was signed electronically. Molli Hazard, MD

## 2015-04-17 ENCOUNTER — Other Ambulatory Visit: Payer: Self-pay | Admitting: Radiology

## 2015-04-18 ENCOUNTER — Telehealth (HOSPITAL_COMMUNITY): Payer: Self-pay | Admitting: *Deleted

## 2015-04-18 ENCOUNTER — Inpatient Hospital Stay (HOSPITAL_COMMUNITY): Admission: RE | Admit: 2015-04-18 | Payer: Self-pay | Source: Ambulatory Visit

## 2015-04-18 NOTE — Telephone Encounter (Signed)
Phone call from pharmacy, they need refill on all psych meds.

## 2015-04-18 NOTE — Telephone Encounter (Signed)
Please forward to [facility]

## 2015-04-19 NOTE — Telephone Encounter (Signed)
Per Chana Bode, they have refills for pt. They figured it out shortly after they called office.

## 2015-04-21 ENCOUNTER — Other Ambulatory Visit: Payer: Self-pay | Admitting: Radiology

## 2015-04-24 ENCOUNTER — Other Ambulatory Visit (HOSPITAL_COMMUNITY): Payer: Self-pay | Admitting: Hematology & Oncology

## 2015-04-24 ENCOUNTER — Encounter (HOSPITAL_COMMUNITY): Payer: Self-pay

## 2015-04-24 ENCOUNTER — Ambulatory Visit (HOSPITAL_COMMUNITY)
Admission: RE | Admit: 2015-04-24 | Discharge: 2015-04-24 | Disposition: A | Discharge status: Home / Self Care | Payer: Medicare Other | Source: Ambulatory Visit | Attending: Hematology & Oncology | Admitting: Hematology & Oncology

## 2015-04-24 DIAGNOSIS — I10 Essential (primary) hypertension: Secondary | ICD-10-CM | POA: Insufficient documentation

## 2015-04-24 DIAGNOSIS — C3492 Malignant neoplasm of unspecified part of left bronchus or lung: Secondary | ICD-10-CM

## 2015-04-24 DIAGNOSIS — C3490 Malignant neoplasm of unspecified part of unspecified bronchus or lung: Secondary | ICD-10-CM | POA: Insufficient documentation

## 2015-04-24 DIAGNOSIS — Z8673 Personal history of transient ischemic attack (TIA), and cerebral infarction without residual deficits: Secondary | ICD-10-CM | POA: Insufficient documentation

## 2015-04-24 DIAGNOSIS — Z923 Personal history of irradiation: Secondary | ICD-10-CM | POA: Diagnosis not present

## 2015-04-24 DIAGNOSIS — F17210 Nicotine dependence, cigarettes, uncomplicated: Secondary | ICD-10-CM | POA: Insufficient documentation

## 2015-04-24 DIAGNOSIS — Z9221 Personal history of antineoplastic chemotherapy: Secondary | ICD-10-CM | POA: Diagnosis not present

## 2015-04-24 DIAGNOSIS — Z79899 Other long term (current) drug therapy: Secondary | ICD-10-CM | POA: Diagnosis not present

## 2015-04-24 DIAGNOSIS — E119 Type 2 diabetes mellitus without complications: Secondary | ICD-10-CM | POA: Insufficient documentation

## 2015-04-24 DIAGNOSIS — J449 Chronic obstructive pulmonary disease, unspecified: Secondary | ICD-10-CM | POA: Diagnosis not present

## 2015-04-24 DIAGNOSIS — Z794 Long term (current) use of insulin: Secondary | ICD-10-CM | POA: Insufficient documentation

## 2015-04-24 DIAGNOSIS — J90 Pleural effusion, not elsewhere classified: Secondary | ICD-10-CM | POA: Insufficient documentation

## 2015-04-24 LAB — CBC WITH DIFFERENTIAL/PLATELET
BASOS PCT: 0 % (ref 0–1)
Basophils Absolute: 0 10*3/uL (ref 0.0–0.1)
Eosinophils Absolute: 0.1 10*3/uL (ref 0.0–0.7)
Eosinophils Relative: 1 % (ref 0–5)
HCT: 35.1 % — ABNORMAL LOW (ref 36.0–46.0)
HEMOGLOBIN: 11.2 g/dL — AB (ref 12.0–15.0)
Lymphocytes Relative: 19 % (ref 12–46)
Lymphs Abs: 0.9 10*3/uL (ref 0.7–4.0)
MCH: 31.7 pg (ref 26.0–34.0)
MCHC: 31.9 g/dL (ref 30.0–36.0)
MCV: 99.4 fL (ref 78.0–100.0)
MONO ABS: 0.3 10*3/uL (ref 0.1–1.0)
Monocytes Relative: 7 % (ref 3–12)
Neutro Abs: 3.3 10*3/uL (ref 1.7–7.7)
Neutrophils Relative %: 73 % (ref 43–77)
PLATELETS: 320 10*3/uL (ref 150–400)
RBC: 3.53 MIL/uL — ABNORMAL LOW (ref 3.87–5.11)
RDW: 13.1 % (ref 11.5–15.5)
WBC: 4.6 10*3/uL (ref 4.0–10.5)

## 2015-04-24 LAB — PROTIME-INR
INR: 0.97 (ref 0.00–1.49)
Prothrombin Time: 13 seconds (ref 11.6–15.2)

## 2015-04-24 LAB — GLUCOSE, CAPILLARY
GLUCOSE-CAPILLARY: 105 mg/dL — AB (ref 70–99)
Glucose-Capillary: 49 mg/dL — ABNORMAL LOW (ref 70–99)

## 2015-04-24 MED ORDER — DEXTROSE 50 % IV SOLN
INTRAVENOUS | Status: AC
  Filled 2015-04-24: qty 50

## 2015-04-24 MED ORDER — FENTANYL CITRATE (PF) 100 MCG/2ML IJ SOLN
INTRAMUSCULAR | Status: AC | PRN
  Administered 2015-04-24 (×2): 25 ug via INTRAVENOUS

## 2015-04-24 MED ORDER — MIDAZOLAM HCL 2 MG/2ML IJ SOLN
INTRAMUSCULAR | Status: AC
  Filled 2015-04-24: qty 6

## 2015-04-24 MED ORDER — FENTANYL CITRATE (PF) 100 MCG/2ML IJ SOLN
INTRAMUSCULAR | Status: AC
  Filled 2015-04-24: qty 4

## 2015-04-24 MED ORDER — MIDAZOLAM HCL 2 MG/2ML IJ SOLN
INTRAMUSCULAR | Status: AC | PRN
  Administered 2015-04-24 (×2): 0.5 mg via INTRAVENOUS
  Administered 2015-04-24: 1 mg via INTRAVENOUS

## 2015-04-24 MED ORDER — CEFAZOLIN SODIUM-DEXTROSE 2-3 GM-% IV SOLR
INTRAVENOUS | Status: AC
  Filled 2015-04-24: qty 50

## 2015-04-24 MED ORDER — SODIUM CHLORIDE 0.9 % IV SOLN
INTRAVENOUS | Status: DC
  Administered 2015-04-24: 12:00:00 via INTRAVENOUS

## 2015-04-24 MED ORDER — LIDOCAINE HCL 1 % IJ SOLN
INTRAMUSCULAR | Status: AC
  Filled 2015-04-24: qty 20

## 2015-04-24 MED ORDER — HEPARIN SOD (PORK) LOCK FLUSH 100 UNIT/ML IV SOLN
INTRAVENOUS | Status: AC
  Filled 2015-04-24: qty 5

## 2015-04-24 MED ORDER — CEFAZOLIN SODIUM-DEXTROSE 2-3 GM-% IV SOLR
2.0000 g | Freq: Once | INTRAVENOUS | Status: AC
  Administered 2015-04-24: 2 g via INTRAVENOUS

## 2015-04-24 MED ORDER — DEXTROSE 50 % IV SOLN
0.5000 | Freq: Once | INTRAVENOUS | Status: AC
  Administered 2015-04-24: 25 mL via INTRAVENOUS

## 2015-04-24 MED ORDER — HEPARIN SOD (PORK) LOCK FLUSH 100 UNIT/ML IV SOLN
INTRAVENOUS | Status: AC | PRN
  Administered 2015-04-24: 500 [IU]

## 2015-04-24 NOTE — Discharge Instructions (Signed)
Implanted Port Insertion, Care After °Refer to this sheet in the next few weeks. These instructions provide you with information on caring for yourself after your procedure. Your health care provider may also give you more specific instructions. Your treatment has been planned according to current medical practices, but problems sometimes occur. Call your health care provider if you have any problems or questions after your procedure. °WHAT TO EXPECT AFTER THE PROCEDURE °After your procedure, it is typical to have the following:  °· Discomfort at the port insertion site. Ice packs to the area will help. °· Bruising on the skin over the port. This will subside in 3-4 days. °HOME CARE INSTRUCTIONS °· After your port is placed, you will get a manufacturer's information card. The card has information about your port. Keep this card with you at all times.   °· Know what kind of port you have. There are many types of ports available.   °· Wear a medical alert bracelet in case of an emergency. This can help alert health care workers that you have a port.   °· The port can stay in for as long as your health care provider believes it is necessary.   °· A home health care nurse may give medicines and take care of the port.   °· You or a family member can get special training and directions for giving medicine and taking care of the port at home.   °SEEK MEDICAL CARE IF:  °· Your port does not flush or you are unable to get a blood return.   °· You have a fever or chills. °SEEK IMMEDIATE MEDICAL CARE IF: °· You have new fluid or pus coming from your incision.   °· You notice a bad smell coming from your incision site.   °· You have swelling, pain, or more redness at the incision or port site.   °· You have chest pain or shortness of breath. °Document Released: 10/06/2013 Document Revised: 12/21/2013 Document Reviewed: 10/06/2013 °[employer]® Patient Information ©2015 [employer]. This information is not intended to replace  advice given to you by your health care provider. Make sure you discuss any questions you have with your health care provider. °Implanted Port Home Guide °An implanted port is a type of central line that is placed under the skin. Central lines are used to provide IV access when treatment or nutrition needs to be given through a person's veins. Implanted ports are used for long-term IV access. An implanted port may be placed because:  °· You need IV medicine that would be irritating to the small veins in your hands or arms.   °· You need long-term IV medicines, such as antibiotics.   °· You need IV nutrition for a long period.   °· You need frequent blood draws for lab tests.   °· You need dialysis.   °Implanted ports are usually placed in the chest area, but they can also be placed in the upper arm, the abdomen, or the leg. An implanted port has two main parts:  °· Reservoir. The reservoir is round and will appear as a small, raised area under your skin. The reservoir is the part where a needle is inserted to give medicines or draw blood.   °· Catheter. The catheter is a thin, flexible tube that extends from the reservoir. The catheter is placed into a large vein. Medicine that is inserted into the reservoir goes into the catheter and then into the vein.   °HOW WILL I CARE FOR MY INCISION SITE? °Do not get the incision site wet. Bathe or   shower as directed by your health care provider.  °HOW IS MY PORT ACCESSED? °Special steps must be taken to access the port:  °· Before the port is accessed, a numbing cream can be placed on the skin. This helps numb the skin over the port site.   °· Your health care provider uses a sterile technique to access the port. °· Your health care provider must put on a mask and sterile gloves. °· The skin over your port is cleaned carefully with an antiseptic and allowed to dry. °· The port is gently pinched between sterile gloves, and a needle is inserted into the port. °· Only  "non-coring" port needles should be used to access the port. Once the port is accessed, a blood return should be checked. This helps ensure that the port is in the vein and is not clogged.   °· If your port needs to remain accessed for a constant infusion, a clear (transparent) bandage will be placed over the needle site. The bandage and needle will need to be changed every week, or as directed by your health care provider.   °· Keep the bandage covering the needle clean and dry. Do not get it wet. Follow your health care provider's instructions on how to take a shower or bath while the port is accessed.   °· If your port does not need to stay accessed, no bandage is needed over the port.   °WHAT IS FLUSHING? °Flushing helps keep the port from getting clogged. Follow your health care provider's instructions on how and when to flush the port. Ports are usually flushed with saline solution or a medicine called heparin. The need for flushing will depend on how the port is used.  °· If the port is used for intermittent medicines or blood draws, the port will need to be flushed:   °· After medicines have been given.   °· After blood has been drawn.   °· As part of routine maintenance.   °· If a constant infusion is running, the port may not need to be flushed.   °HOW LONG WILL MY PORT STAY IMPLANTED? °The port can stay in for as long as your health care provider thinks it is needed. When it is time for the port to come out, surgery will be done to remove it. The procedure is similar to the one performed when the port was put in.  °WHEN SHOULD I SEEK IMMEDIATE MEDICAL CARE? °When you have an implanted port, you should seek immediate medical care if:  °· You notice a bad smell coming from the incision site.   °· You have swelling, redness, or drainage at the incision site.   °· You have more swelling or pain at the port site or the surrounding area.   °· You have a fever that is not controlled with medicine. °Document  Released: 12/16/2005 Document Revised: 10/06/2013 Document Reviewed: 08/23/2013 °[employer]® Patient Information ©2015 [employer]. This information is not intended to replace advice given to you by your health care provider. Make sure you discuss any questions you have with your health care provider. °Conscious Sedation °Sedation is the use of medicines to promote relaxation and relieve discomfort and anxiety. Conscious sedation is a type of sedation. Under conscious sedation you are less alert than normal but are still able to respond to instructions or stimulation. Conscious sedation is used during short medical and dental procedures. It is milder than deep sedation or general anesthesia and allows you to return to your regular activities sooner.  °LET YOUR HEALTH CARE PROVIDER   KNOW ABOUT:  °· Any allergies you have. °· All medicines you are taking, including vitamins, herbs, eye drops, creams, and over-the-counter medicines. °· Use of steroids (by mouth or creams). °· Previous problems you or members of your family have had with the use of anesthetics. °· Any blood disorders you have. °· Previous surgeries you have had. °· Medical conditions you have. °· Possibility of pregnancy, if this applies. °· Use of cigarettes, alcohol, or illegal drugs. °RISKS AND COMPLICATIONS °Generally, this is a safe procedure. However, as with any procedure, problems can occur. Possible problems include: °· Oversedation. °· Trouble breathing on your own. You may need to have a breathing tube until you are awake and breathing on your own. °· Allergic reaction to any of the medicines used for the procedure. °BEFORE THE PROCEDURE °· You may have blood tests done. These tests can help show how well your kidneys and liver are working. They can also show how well your blood clots. °· A physical exam will be done.   °· Only take medicines as directed by your health care provider. You may need to stop taking medicines (such as blood  thinners, aspirin, or nonsteroidal anti-inflammatory drugs) before the procedure.   °· Do not eat or drink at least 6 hours before the procedure or as directed by your health care provider. °· Arrange for a responsible adult, family member, or friend to take you home after the procedure. He or she should stay with you for at least 24 hours after the procedure, until the medicine has worn off. °PROCEDURE  °· An intravenous (IV) catheter will be inserted into one of your veins. Medicine will be able to flow directly into your body through this catheter. You may be given medicine through this tube to help prevent pain and help you relax. °· The medical or dental procedure will be done. °AFTER THE PROCEDURE °· You will stay in a recovery area until the medicine has worn off. Your blood pressure and pulse will be checked.   °·  Depending on the procedure you had, you may be allowed to go home when you can tolerate liquids and your pain is under control. °Document Released: 09/10/2001 Document Revised: 12/21/2013 Document Reviewed: 08/23/2013 °[employer]® Patient Information ©2015 [employer]. This information is not intended to replace advice given to you by your health care provider. Make sure you discuss any questions you have with your health care provider. °Conscious Sedation, Adult, Care After °Refer to this sheet in the next few weeks. These instructions provide you with information on caring for yourself after your procedure. Your health care provider may also give you more specific instructions. Your treatment has been planned according to current medical practices, but problems sometimes occur. Call your health care provider if you have any problems or questions after your procedure. °WHAT TO EXPECT AFTER THE PROCEDURE  °After your procedure: °· You may feel sleepy, clumsy, and have poor balance for several hours. °· Vomiting may occur if you eat too soon after the procedure. °HOME CARE INSTRUCTIONS °· Do not  participate in any activities where you could become injured for at least 24 hours. Do not: °¨ Drive. °¨ Swim. °¨ Ride a bicycle. °¨ Operate heavy machinery. °¨ Cook. °¨ Use power tools. °¨ Climb ladders. °¨ Work from a high place. °· Do not make important decisions or sign legal documents until you are improved. °· If you vomit, drink water, juice, or soup when you can drink without vomiting. Make sure you have little   or no nausea before eating solid foods. °· Only take over-the-counter or prescription medicines for pain, discomfort, or fever as directed by your health care provider. °· Make sure you and your family fully understand everything about the medicines given to you, including what side effects may occur. °· You should not drink alcohol, take sleeping pills, or take medicines that cause drowsiness for at least 24 hours. °· If you smoke, do not smoke without supervision. °· If you are feeling better, you may resume normal activities 24 hours after you were sedated. °· Keep all appointments with your health care provider. °SEEK MEDICAL CARE IF: °· Your skin is pale or bluish in color. °· You continue to feel nauseous or vomit. °· Your pain is getting worse and is not helped by medicine. °· You have bleeding or swelling. °· You are still sleepy or feeling clumsy after 24 hours. °SEEK IMMEDIATE MEDICAL CARE IF: °· You develop a rash. °· You have difficulty breathing. °· You develop any type of allergic problem. °· You have a fever. °MAKE SURE YOU: °· Understand these instructions. °· Will watch your condition. °· Will get help right away if you are not doing well or get worse. °Document Released: 10/06/2013 Document Reviewed: 10/06/2013 °[employer]® Patient Information ©2015 [employer]. This information is not intended to replace advice given to you by your health care provider. Make sure you discuss any questions you have with your health care provider. ° °

## 2015-04-24 NOTE — Progress Notes (Signed)
Report given to Butch Penny - supervisor at [facility].  Discharge papers with port packet sent with pt when discharged. Reviewed discharge instructions with Butch Penny via telephone and sent them with the patient and caretaker that is currently with pt.

## 2015-04-24 NOTE — Procedures (Signed)
LIJV PAC SVC RA No comp 

## 2015-04-24 NOTE — H&P (Signed)
Chief Complaint: "I'm getting a port a cath"  Referring Physician(s): Penland,Shannon K  History of Present Illness: [name] is a 69 y.o. female with history of stage IV squamous cell carcinoma of the lung diagnosed in 2014 and prior chemoradiation, currently on immunotherapy.Recent imaging has revealed increased size of the right infrahilar soft tissue density and obstruction of bronchus intermedius suspicious for recurrent carcinoma as well as increased moderate right effusion and airspace disease in both right and left lungs. She presents today for Port-A-Cath placement for chemotherapy.  Past Medical History  Diagnosis Date  . H/O: stroke 2012, 2000,     X3  . Anxiety   . PTSD (post-traumatic stress disorder)   . Depression   . SVD (spontaneous vaginal delivery)     x 1  . Smoker   . Osteoarthritis     knees, hips hands,   . Shortness of breath   . Pain     SEVERE PAIN BACK, RIGHT HIP AND KNEES--HARRINGTON RODS AND CERVICAL PLATES--USES WALKER OR CANE WHEN AMBULATING - GOES TO A PAIN CLINIC-STATES SHE NEEDS RT HIP AND BOTH KNEES REPLACED. PT STATES SHE WAS TOLD THE CEMENT AROUND THE HARRINGTON RODS IS CRACKED.  [name] Hot flashes, menopausal     SEVERE  . Complication of anesthesia 03/02/13    severe headache,vertigo postop  . COPD (chronic obstructive pulmonary disease)     stable  . 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 Lung cancer   . Status post radiation therapy within four to twelve weeks 10/18/13-12/06/13    lung ca  66Gy  . Family history of anesthesia complication     equipment failure caused problem, suffered a stroke & heartattack  . Diabetes mellitus, type II   . Diabetes mellitus without complication   . Cancer   . Hypertension   . COPD (chronic obstructive pulmonary disease)     Past Surgical History  Procedure Laterality Date  . Carpal tunnel release      BILATERAL  . Nasal sinus surgery    . Lumbar fusion      L4-S1  . Cervical fusion    .  Hernia repair    . Salpingoophorectomy Right   . Vaginal hysterectomy      TAH w/ ovary removal  . Transthoracic echocardiogram  12-03-2011    LVSF NORMAL/ EF 60-65%  . Vulvectomy N/A 03/02/2013    Procedure: WIDE LOCAL EXCISION VULVAR;  Surgeon: Imagene Gurney A. Alycia Rossetti, MD;  Location: [facility];  Service: Gynecology;  Laterality: N/A;  . Co2 laser application N/A 9/32/3557    Procedure: LASER APPLICATION OF THE VULVA;  Surgeon: Janie Morning, MD;  Location[facility];  Service: Gynecology;  Laterality: N/A;  . Video bronchoscopy Bilateral 09/23/2013    Procedure: VIDEO BRONCHOSCOPY WITHOUT FLUORO;  Surgeon: Tanda Rockers, MD;  Location: [facility];  Service: Cardiopulmonary;  Laterality: Bilateral;  . Hemiarthroplasty shoulder fracture Left 05/25/2014    DR CHANDLER  . Reverse shoulder arthroplasty Left 05/24/2014    Procedure:   Hemi-Arthroplasty  Left Shoulder;  Surgeon: Nita Sells, MD;  Location: [place];  Service: Orthopedics;  Laterality: Left;  . Orif humerus fracture Right 12/12/2014    Procedure: OPEN REDUCTION INTERNAL FIXATION (ORIF) PROXIMAL HUMERUS FRACTURE;  Surgeon: Nita Sells, MD;  Location: [place];  Service: Orthopedics;  Laterality: Right;    Allergies: Carafate; Prednisone; Nsaids; Aspirin; Aspirin; Ibuprofen; Nsaids; and Dulera  Medications: Prior to Admission medications  Medication Sig Start Date End Date Taking? Authorizing Provider  albuterol (PROVENTIL HFA;VENTOLIN HFA) 108 (90 BASE) MCG/ACT inhaler 2 puffs every 4 - 6 hours as needed for dyspnea Patient taking differently: Inhale 2 puffs into the lungs every 6 (six) hours as needed for wheezing or shortness of breath.  04/07/15  Yes Patrici Ranks, MD  albuterol (VENTOLIN HFA) 108 (90 BASE) MCG/ACT inhaler Inhale 2 puffs into the lungs every 6 (six) hours as needed for wheezing or shortness of breath. 03/15/15  Yes Erline Hau, MD  ALPRAZolam Duanne Moron) 1 MG tablet Take  one at 3 pm 03/31/15  Yes Cloria Spring, MD  alprazolam Duanne Moron) 2 MG tablet Take 1 tablet (2 mg total) by mouth 2 (two) times daily. 03/31/15  Yes Cloria Spring, MD  atenolol (TENORMIN) 50 MG tablet Take 1 tablet (50 mg total) by mouth daily. 03/15/15  Yes Estela Leonie Green, MD  esomeprazole (NEXIUM) 40 MG capsule Take 1 capsule (40 mg total) by mouth daily. 03/15/15  Yes Estela Leonie Green, MD  fentaNYL (DURAGESIC - DOSED MCG/HR) 12 MCG/HR Place 1 patch (12.5 mcg total) onto the skin every 3 (three) days. 04/07/15  Yes Patrici Ranks, MD  fluticasone (FLONASE) 50 MCG/ACT nasal spray Place 1 spray into both nostrils daily as needed for allergies or rhinitis. 03/15/15  Yes Estela Leonie Green, MD  insulin aspart (NOVOLOG FLEXPEN) 100 UNIT/ML FlexPen Inject 5 Units into the skin 3 (three) times daily with meals. Patient taking differently: Inject 3-7 Units into the skin 3 (three) times daily with meals. 3 units every morning before breakfast and 7 units before lunch and dinner 03/15/15  Yes Estela Leonie Green, MD  Insulin Glargine (LANTUS SOLOSTAR) 100 UNIT/ML Solostar Pen Inject 20 Units into the skin daily at 10 pm. Patient taking differently: Inject 22 Units into the skin daily at 10 pm.  03/15/15  Yes Erline Hau, MD  Insulin Pen Needle (NOVOFINE) 30G X 8 MM MISC Inject 1 packet into the skin as needed.   Yes Historical Provider, MD  levothyroxine (SYNTHROID) 50 MCG tablet Take 1.5 tablets (75 mcg total) by mouth daily before breakfast. 03/15/15  Yes Estela Leonie Green, MD  lisinopril (PRINIVIL,ZESTRIL) 5 MG tablet Take 1 tablet (5 mg total) by mouth daily. 03/15/15  Yes Erline Hau, MD  oxyCODONE-acetaminophen (PERCOCET) 10-325 MG per tablet 10-325 tablets. 03/15/15  Yes Historical Provider, MD  sertraline (ZOLOFT) 100 MG tablet Take 2 tablets (200 mg total) by mouth daily. 03/31/15  Yes Cloria Spring, MD  traZODone (DESYREL) 150 MG tablet Take 2  tablets (300 mg total) by mouth at bedtime. 03/31/15  Yes Cloria Spring, MD  ANORO ELLIPTA 62.5-25 MCG/INH AEPB Inhale 1 puff into the lungs daily. 03/17/15   Historical Provider, MD  chlorzoxazone (PARAFON) 500 MG tablet Take 1 tablet (500 mg total) by mouth 4 (four) times daily. 03/15/15   Erline Hau, MD  fentaNYL (DURAGESIC - DOSED MCG/HR) 25 MCG/HR patch Place 25 mcg onto the skin every 3 (three) days.    Historical Provider, MD  ondansetron (ZOFRAN) 8 MG tablet Take 1 tablet (8 mg total) by mouth every 8 (eight) hours as needed for nausea or vomiting. 04/07/15   Patrici Ranks, MD  oxyCODONE-acetaminophen (PERCOCET/ROXICET) 5-325 MG per tablet Take 1 tablet every 4 hours as needed for pain Patient taking differently: Take 1 tablet by mouth every 4 (four) hours  as needed for moderate pain or severe pain.  04/07/15   Patrici Ranks, MD  tiotropium (SPIRIVA) 18 MCG inhalation capsule Place 1 capsule (18 mcg total) into inhaler and inhale daily. 03/15/15   Erline Hau, MD    Family History  Problem Relation Age of Onset  . Cancer Mother     COLON  . Depression Mother   . Heart disease Father   . Diabetes Maternal Aunt   . Cancer Maternal Grandfather     KIDNEY   . Diabetes Maternal Grandfather   . Hypertension Maternal Grandfather   . Heart disease Maternal Grandfather   . Drug abuse Sister     History   Social History  . Marital Status: Divorced    Spouse Name: N/A  . Number of Children: N/A  . Years of Education: N/A   Social History Main Topics  . Smoking status: Current Every Day Smoker  . Smokeless tobacco: Never Used  . Alcohol Use: No  . Drug Use: No  . Sexual Activity: No   Other Topics Concern  . None   Social History Narrative   ** Merged History Encounter **         Review of Systems  Constitutional: Positive for fatigue. Negative for fever and chills.  Respiratory: Positive for cough and shortness of breath.   Cardiovascular:  Positive for chest pain.  Gastrointestinal: Negative for nausea, vomiting, abdominal pain and blood in stool.  Genitourinary: Negative for dysuria and hematuria.  Musculoskeletal: Positive for back pain.  Neurological: Negative for headaches.  Psychiatric/Behavioral: The patient is nervous/anxious.     Vital Signs: BP 126/74 mmHg  Pulse 73  Temp(Src) 98.3 F (36.8 C) (Oral)  Resp 18  Ht '5\' 6"'$  (1.676 m)  Wt 160 lb (72.576 kg)  BMI 25.84 kg/m2  SpO2 91%  Physical Exam  Constitutional: She is oriented to person, place, and time.  Frail WF in NAD  Cardiovascular: Normal rate and regular rhythm.   Pulmonary/Chest: Effort normal.  Scattered bilateral wheezes with diminished breath sounds at bases, greater on the right  Abdominal: Soft. Bowel sounds are normal. There is no tenderness.  Musculoskeletal: She exhibits edema.  Neurological: She is alert and oriented to person, place, and time.    Imaging: Ct Chest W Contrast  04/13/2015   CLINICAL DATA:  Followup right upper lobe non-small cell lung carcinoma. Previous radiation therapy and chemotherapy. Hemoptysis. Chest pain and shortness of breath.  EXAM: CT CHEST AND ABDOMEN WITH CONTRAST  TECHNIQUE: Multidetector CT imaging of the chest and abdomen was performed following the standard protocol during bolus administration of intravenous contrast.  CONTRAST:  181m OMNIPAQUE IOHEXOL 300 MG/ML  SOLN  COMPARISON:  Chest CT on 11/21/2014 and abdomen CT on 09/15/2014  FINDINGS: CT CHEST FINDINGS  Mediastinum/Lymph Nodes: Evaluation of hilar and mediastinal structures is suboptimal due to lack of IV contrast on this exam. New obstruction of the right bronchus intermedius is seen with increased size of a right infrahilar soft tissue density, which currently measures 2.2 x 3.0 cm on image 36 series 3, compared to approximately 1.3 x 1.5 cm previously. Other small mediastinal lymph nodes in the right paratracheal and AP window regions show no  significant change, measuring up to 10 mm on image 25/series 3.  Lungs/Pleura: Increased size of moderate right pleural effusion is seen. There is increased airspace disease, greatest in the central right middle and lower lobes. Differential diagnosis includes postobstructive pneumonitis, radiation pneumonitis, and infection.  Some new patchy airspace disease is also seen in the inferior lingula and left lower lobe, which could be due to infection or drug reaction.  Musculoskeletal/Soft Tissues: No suspicious bone lesions. Anterior dislocation of right shoulder is seen which is new since previous study and there has been interval placement of internal fixation hardware in the proximal right humerus.  CT ABDOMEN FINDINGS  Hepatobiliary: No masses or other significant abnormality identified.  Pancreas: No mass, inflammatory changes, or other significant abnormality identified.  Spleen:  Within normal limits in size and appearance.  Adrenals:  No masses identified.  Kidneys/Urinary Tract:  No evidence of masses or hydronephrosis.  Stomach/Bowel/Peritoneum: No evidence of wall thickening, mass, or obstruction.  Vascular/Lymphatic: No pathologically enlarged lymph nodes identified. No other significant abnormality visualized.  Other:  None.  Musculoskeletal:  No suspicious bone lesions identified.  IMPRESSION: Increased size of right infrahilar soft tissue density and obstruction of bronchus intermedius, suspicious for recurrent carcinoma.  Increased moderate right pleural effusion, and airspace disease predominantly in the central right middle and lower lobes. Differential diagnosis includes postobstructive pneumonitis, radiation pneumonitis, and infection.  Patchy areas of airspace disease in the inferior lingula and left lower lobe are suspicious for infection or drug reaction.  Stable shotty mediastinal lymph nodes.  Anterior dislocation of right shoulder, with internal fixation hardware seen in proximal right  humerus.  No evidence of abdominal metastatic disease.   Electronically Signed   By: Earle Gell M.D.   On: 04/13/2015 16:06   Ct Abdomen W Contrast  04/13/2015   CLINICAL DATA:  Followup right upper lobe non-small cell lung carcinoma. Previous radiation therapy and chemotherapy. Hemoptysis. Chest pain and shortness of breath.  EXAM: CT CHEST AND ABDOMEN WITH CONTRAST  TECHNIQUE: Multidetector CT imaging of the chest and abdomen was performed following the standard protocol during bolus administration of intravenous contrast.  CONTRAST:  155m OMNIPAQUE IOHEXOL 300 MG/ML  SOLN  COMPARISON:  Chest CT on 11/21/2014 and abdomen CT on 09/15/2014  FINDINGS: CT CHEST FINDINGS  Mediastinum/Lymph Nodes: Evaluation of hilar and mediastinal structures is suboptimal due to lack of IV contrast on this exam. New obstruction of the right bronchus intermedius is seen with increased size of a right infrahilar soft tissue density, which currently measures 2.2 x 3.0 cm on image 36 series 3, compared to approximately 1.3 x 1.5 cm previously. Other small mediastinal lymph nodes in the right paratracheal and AP window regions show no significant change, measuring up to 10 mm on image 25/series 3.  Lungs/Pleura: Increased size of moderate right pleural effusion is seen. There is increased airspace disease, greatest in the central right middle and lower lobes. Differential diagnosis includes postobstructive pneumonitis, radiation pneumonitis, and infection. Some new patchy airspace disease is also seen in the inferior lingula and left lower lobe, which could be due to infection or drug reaction.  Musculoskeletal/Soft Tissues: No suspicious bone lesions. Anterior dislocation of right shoulder is seen which is new since previous study and there has been interval placement of internal fixation hardware in the proximal right humerus.  CT ABDOMEN FINDINGS  Hepatobiliary: No masses or other significant abnormality identified.  Pancreas: No  mass, inflammatory changes, or other significant abnormality identified.  Spleen:  Within normal limits in size and appearance.  Adrenals:  No masses identified.  Kidneys/Urinary Tract:  No evidence of masses or hydronephrosis.  Stomach/Bowel/Peritoneum: No evidence of wall thickening, mass, or obstruction.  Vascular/Lymphatic: No pathologically enlarged lymph nodes identified. No other significant abnormality visualized.  Other:  None.  Musculoskeletal:  No suspicious bone lesions identified.  IMPRESSION: Increased size of right infrahilar soft tissue density and obstruction of bronchus intermedius, suspicious for recurrent carcinoma.  Increased moderate right pleural effusion, and airspace disease predominantly in the central right middle and lower lobes. Differential diagnosis includes postobstructive pneumonitis, radiation pneumonitis, and infection.  Patchy areas of airspace disease in the inferior lingula and left lower lobe are suspicious for infection or drug reaction.  Stable shotty mediastinal lymph nodes.  Anterior dislocation of right shoulder, with internal fixation hardware seen in proximal right humerus.  No evidence of abdominal metastatic disease.   Electronically Signed   By: Earle Gell M.D.   On: 04/13/2015 16:06    Labs:  CBC:  Recent Labs  03/10/15 1148 03/12/15 0706 04/07/15 1145 04/24/15 1210  WBC 6.2 5.2 4.7 4.6  HGB 10.9* 11.0* 11.9* 11.2*  HCT 32.8* 33.6* 36.2 35.1*  PLT 276 235 214 320    COAGS:  Recent Labs  12/11/14 0054 04/24/15 1210  INR 0.94 0.97    BMP:  Recent Labs  03/11/15 1358 03/12/15 0706 03/15/15 0606 04/07/15 1145  NA 134* 138 138 135  K 3.0* 3.0* 4.3 3.8  CL 102 101 95* 98  CO2 27 29 36* 29  GLUCOSE 237* 90 102* 82  BUN '12 7 6 8  '$ CALCIUM 7.4* 8.0* 9.1 9.0  CREATININE 0.97 0.77 0.82 0.80  GFRNONAA 59* 85* 72* 75*  GFRAA 69* >90 84* 86*    LIVER FUNCTION TESTS:  Recent Labs  02/17/15 1530 02/26/15 0856 02/27/15 0539  04/07/15 1145  BILITOT 0.7 0.5 0.4 0.4  AST 16 56* 46* 20  ALT '8 13 12 8  '$ ALKPHOS 120* 117 84 93  PROT 6.4 8.1 5.8* 7.0  ALBUMIN 3.6 4.4 3.0* 3.9    TUMOR MARKERS: No results for input(s): AFPTM, CEA, CA199, CHROMGRNA in the last 8760 hours.  Assessment and Plan:  [name] is a 69 y.o. female with history of stage IV squamous cell carcinoma of the lung diagnosed in 2014 and prior chemoradiation, currently on immunotherapy.Recent imaging has revealed increased size of the right infrahilar soft tissue density and obstruction of bronchus intermedius suspicious for recurrent carcinoma as well as increased moderate right effusion and airspace disease in both right and left lungs. She presents today for Port-A-Cath placement for chemotherapy.Risks and benefits discussed with the patient including, but not limited to bleeding, infection, pneumothorax, or fibrin sheath development and need for additional procedures. All of the patient's questions were answered, patient is agreeable to proceed. Consent signed and in chart.      Signed: Autumn Messing 04/24/2015, 1:14 PM   I spent a total of 20 minutes face to face in clinical consultation, greater than 50% of which was counseling/coordinating care for Port-A-Cath placement

## 2015-04-24 NOTE — Progress Notes (Signed)
Pt arrived with two fentanyl patches with one dated 4/21/16on back of Right shoulder  and another  Fentanyl patch taped to upper mid back with date 04/23/15.  Old fentanyl patch (dated 04/20/15) removed in short stay. Informed Butch Penny at [facility] facility of these findings.

## 2015-04-24 NOTE — Progress Notes (Signed)
Dr. Barbie Banner notified of patient's CBG. Will continue with Hypoglycemia protocol.

## 2015-04-25 ENCOUNTER — Encounter (HOSPITAL_COMMUNITY): Payer: Self-pay

## 2015-04-25 ENCOUNTER — Other Ambulatory Visit (HOSPITAL_COMMUNITY): Payer: Self-pay | Admitting: Oncology

## 2015-04-25 ENCOUNTER — Encounter (HOSPITAL_BASED_OUTPATIENT_CLINIC_OR_DEPARTMENT_OTHER): Payer: Medicare Other

## 2015-04-25 VITALS — BP 164/76 | HR 73 | Temp 98.1°F | Resp 18 | Wt 162.0 lb

## 2015-04-25 DIAGNOSIS — E032 Hypothyroidism due to medicaments and other exogenous substances: Secondary | ICD-10-CM

## 2015-04-25 DIAGNOSIS — Z5112 Encounter for antineoplastic immunotherapy: Secondary | ICD-10-CM | POA: Diagnosis not present

## 2015-04-25 DIAGNOSIS — C3431 Malignant neoplasm of lower lobe, right bronchus or lung: Secondary | ICD-10-CM | POA: Diagnosis not present

## 2015-04-25 DIAGNOSIS — C342 Malignant neoplasm of middle lobe, bronchus or lung: Secondary | ICD-10-CM

## 2015-04-25 DIAGNOSIS — C3490 Malignant neoplasm of unspecified part of unspecified bronchus or lung: Secondary | ICD-10-CM

## 2015-04-25 DIAGNOSIS — C3411 Malignant neoplasm of upper lobe, right bronchus or lung: Secondary | ICD-10-CM | POA: Diagnosis not present

## 2015-04-25 DIAGNOSIS — C3492 Malignant neoplasm of unspecified part of left bronchus or lung: Secondary | ICD-10-CM

## 2015-04-25 LAB — COMPREHENSIVE METABOLIC PANEL
ALBUMIN: 3 g/dL — AB (ref 3.5–5.2)
ALK PHOS: 89 U/L (ref 39–117)
ALT: 7 U/L (ref 0–35)
ANION GAP: 5 (ref 5–15)
AST: 14 U/L (ref 0–37)
BUN: 5 mg/dL — AB (ref 6–23)
CALCIUM: 8.3 mg/dL — AB (ref 8.4–10.5)
CHLORIDE: 99 mmol/L (ref 96–112)
CO2: 31 mmol/L (ref 19–32)
Creatinine, Ser: 0.7 mg/dL (ref 0.50–1.10)
GFR calc Af Amer: >90 mL/min (ref >90)
GFR, EST NON AFRICAN AMERICAN: 88 mL/min — AB (ref >90)
Glucose, Bld: 277 mg/dL — ABNORMAL HIGH (ref 70–99)
Potassium: 4.2 mmol/L (ref 3.5–5.1)
Sodium: 135 mmol/L (ref 135–145)
Total Bilirubin: 0.3 mg/dL (ref 0.3–1.2)
Total Protein: 5.7 g/dL — ABNORMAL LOW (ref 6.0–8.3)

## 2015-04-25 LAB — CBC WITH DIFFERENTIAL/PLATELET
Basophils Absolute: 0 10*3/uL (ref 0.0–0.1)
Basophils Relative: 1 % (ref 0–1)
EOS ABS: 0.1 10*3/uL (ref 0.0–0.7)
Eosinophils Relative: 1 % (ref 0–5)
HCT: 33.1 % — ABNORMAL LOW (ref 36.0–46.0)
HEMOGLOBIN: 10.4 g/dL — AB (ref 12.0–15.0)
Lymphocytes Relative: 11 % — ABNORMAL LOW (ref 12–46)
Lymphs Abs: 0.7 10*3/uL (ref 0.7–4.0)
MCH: 31.6 pg (ref 26.0–34.0)
MCHC: 31.4 g/dL (ref 30.0–36.0)
MCV: 100.6 fL — ABNORMAL HIGH (ref 78.0–100.0)
Monocytes Absolute: 0.3 10*3/uL (ref 0.1–1.0)
Monocytes Relative: 6 % (ref 3–12)
Neutro Abs: 4.8 10*3/uL (ref 1.7–7.7)
Neutrophils Relative %: 81 % — ABNORMAL HIGH (ref 43–77)
Platelets: 280 10*3/uL (ref 150–400)
RBC: 3.29 MIL/uL — AB (ref 3.87–5.11)
RDW: 13 % (ref 11.5–15.5)
WBC: 5.9 10*3/uL (ref 4.0–10.5)

## 2015-04-25 LAB — TSH: TSH: 49.174 u[IU]/mL — ABNORMAL HIGH (ref 0.350–4.500)

## 2015-04-25 MED ORDER — SODIUM CHLORIDE 0.9 % IV SOLN
Freq: Once | INTRAVENOUS | Status: AC
  Administered 2015-04-25: 12:00:00 via INTRAVENOUS

## 2015-04-25 MED ORDER — SODIUM CHLORIDE 0.9 % IV SOLN
3.0000 mg/kg | Freq: Once | INTRAVENOUS | Status: AC
  Administered 2015-04-25: 220 mg via INTRAVENOUS
  Filled 2015-04-25: qty 16

## 2015-04-25 MED ORDER — HEPARIN SOD (PORK) LOCK FLUSH 100 UNIT/ML IV SOLN
500.0000 [IU] | Freq: Once | INTRAVENOUS | Status: AC | PRN
  Administered 2015-04-25: 500 [IU]

## 2015-04-25 MED ORDER — LEVOTHYROXINE SODIUM 50 MCG PO TABS: 75.0000 ug | ORAL_TABLET | Freq: Every day | ORAL | Status: DC

## 2015-04-25 MED ORDER — SODIUM CHLORIDE 0.9 % IJ SOLN
10.0000 mL | INTRAMUSCULAR | Status: DC | PRN
  Administered 2015-04-25: 10 mL
  Filled 2015-04-25: qty 10

## 2015-04-25 NOTE — Patient Instructions (Signed)
[facility] Discharge Instructions for Patients Receiving Chemotherapy  Today you received the following chemotherapy agents Opdivo. To help prevent nausea and vomiting after your treatment, we encourage you to take your nausea medication as instructed. If you develop nausea and vomiting that is not controlled by your nausea medication, call the clinic. If it is after clinic hours your family physician or the after hours number for the clinic or go to the Emergency Department.  BELOW ARE SYMPTOMS THAT SHOULD BE REPORTED IMMEDIATELY:  *FEVER GREATER THAN 101.0 F  *CHILLS WITH OR WITHOUT FEVER  NAUSEA AND VOMITING THAT IS NOT CONTROLLED WITH YOUR NAUSEA MEDICATION  *UNUSUAL SHORTNESS OF BREATH  *UNUSUAL BRUISING OR BLEEDING  TENDERNESS IN MOUTH AND THROAT WITH OR WITHOUT PRESENCE OF ULCERS  *URINARY PROBLEMS  *BOWEL PROBLEMS  UNUSUAL RASH Items with * indicate a potential emergency and should be followed up as soon as possible.  Return as scheduled.  I have been informed and understand all the instructions given to me. I know to contact the clinic, my physician, or go to the Emergency Department if any problems should occur. I do not have any questions at this time, but understand that I may call the clinic during office hours or the Patient Navigator at [phone] should I have any questions or need assistance in obtaining follow up care.    __________________________________________  _____________  __________ Signature of Patient or Authorized Representative            Date                   Time    __________________________________________ Nurse's Signature  

## 2015-04-25 NOTE — Progress Notes (Signed)
Tolerated Opdivo infusion well.

## 2015-05-01 ENCOUNTER — Encounter (HOSPITAL_COMMUNITY): Payer: Self-pay | Admitting: Oncology

## 2015-05-01 NOTE — Assessment & Plan Note (Addendum)
Currently on salvage Nivolumab every 2 weeks.  Pre-chemo labs as ordered: CBC diff, CMET, TSH  Referral to [facility] to see Dr. Eddie Dibbles.  She is an established patient with him, she reports.  See dictation above for details of referral.  Return next week for treatment.  Return in 3 weeks for follow-up.

## 2015-05-01 NOTE — Progress Notes (Addendum)
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 interrupted stitches for [name] subcutaneous tissue [name] [name] running of 4-0 Vicryl subcuticular stitch for [name] skin. [name] neck incision was closed with [name] 4-0 Vicryl subcuticular stitch. Derma-bond was applied to both surgical incisions. [name] port reservoir was flushed [name] instilled with heparinized saline. No complications.  FINDINGS: [name] left IJ vein Port-[name]-Cath is in place with its tip at [name] cavoatrial junction.  COMPLICATIONS: None  IMPRESSION: Successful 8 French left internal jugular vein power port placement with its tip at [name] SVC/RA junction.   Electronically Signed   By: Marybelle Killings M.D.   On: 04/24/2015 17:36     ASSESSMENT [name] PLAN:  Squamous cell lung cancer Currently on salvage Nivolumab every 2 weeks.  Pre-chemo labs as ordered: CBC diff, CMET, TSH  Referral to [place] to see Dr. Eddie Dibbles.  She is an established patient with him, she reports.  See dictation above for details of referral.  Return next week for treatment.  Return in 3 weeks for follow-up.    THERAPY PLAN:  Continue with therapy as planned in [name] salvage setting.  All questions were answered. [name] patient knows to call [name] clinic with any problems, questions or concerns. We can certainly see [name] patient much sooner if necessary.  Patient [name] plan discussed with Dr. Ancil Linsey [name] she is in agreement with [name] aforementioned.   This note is electronically signed by: Robynn Pane 05/03/2015 10:26 AM  ADDENDUM: Due to [name] patient's concerns, social work at [employer] was  consulted.  Unfortunately, Abby Potash is off site today for [name] conference she is attending.  We also contacted DSS with some of [name] patient's complaints.  Butch Penny from [facility] called [name] [facility] demanding [name] patient's orthopod referral be cancelled.  Her appointment was scheduled for May 6.  Scheduler tried to explain why [name] referral is being made [name] refused to cancel [name] referral.  I was asked to speak with Butch Penny.  "I need you to change [name] patient's appointment with [place]" was [name] first demand I was given after [name] brief introduction of each other.  I explained that I am not in control, nor participate, in [place] scheduling, therefore, I will not be changing [name] patient appointment.  She explained that due to transportation issues, [name] 3 day advanced notice is not appropriate for their facility.  Therefore, I provided Butch Penny [name] phone number for [employer] after I searched for it on Google.  She then proceeded to inform me, in no confusing matter, that her primary care provider, Dr. Nevada Crane, wanted [name] patient to get established with her "cancer doctor" [name] that [name] referral to Orthopedics was not indicated.  I re-educated Butch Penny, that she has reached [name] [facility] [name] I am [name] provider in [name] patient's oncology care.  Thus, [name] patient is established with her "Cancer Doctor" [name] I want her to see her orthopod for her knee pain complaints.  Butch Penny, in no confusing way, indicated that she will not be rescheduling [name] patient's appointment with Orthopedics because Dr. Nevada Crane does not want her to see Ortho.  I told her that I  am providing an order [name] I want her seen by Dr. Eddie Dibbles, "period, end of story!"  Her excuses were ludicrous [name] made no sense when patient focused care is considered paramount.  I did not address her dental issue during [name] visit, nor her concerns of appropriate medication administration since I do not have all of [name] details, [name], on my order sheet from [facility], I  re-iterated her Zofran order, dose, [name] frequency.  I will defer further investigation into this to DSS. We were informed that DSS will meet [name] patient during her next chemotherapy appointment.  I agreed with Butch Penny that [name] patient's appointment with [facility] can be rescheduled, [name] I demanded an update from her regarding [name] patient's appointment status.  We received [name] call back about 5- 10 minutes later reporting that [name] patient's appointment with Dr. Eddie Dibbles was rescheduled for May 19th.  I feel that [name] is [name] real problem with [name] patient [name] her concerns regarding her care at [facility].  These issues need further investigated.  This was re-enforced with my inappropriate [name] unprofessional conversation from Butch Penny at [facility].  She was rude [name] inappropriate when speaking with [name] patient Cancer Provider [name] I will not tolerate unwarranted disrespect [name] blatant disregard for patient's medical needs.  Dr. Ancil Linsey was witness to my phone conversation.  I learned this AM, that [name] Director of [employer] reported [name] [name] [facility] [name] demanded to see me.  Unfortunately I am off site [name] I will be glad to meet with he/she with [name] scheduled meeting date [name] time, not unannounced.  KEFALAS,THOMAS 05/03/2015 11:28 AM   Addendum: Dr. Nevada Crane called me today [name] we discussed [name] patient's case.  He provided me with some insight that I was not previously privy to.  With that being said, I have to take [name] patient's complaints at face value [name] I think if she is complaining of knee pain, [name] is established with Dr. Eddie Dibbles, it is reasonable for her to see orthopod to provide an opinion.  I am not increasing [name] patient's pain medications for her complaint.  [name], if [name] is conservative management for her knee discomfort, I would rather that than change her pain regimen.  Dr. Nevada Crane agrees.  "Even sick people get sick."  I do not think [name] referral is urgent, [name] should be  completed.  KEFALAS,THOMAS 05/04/2015 4:45 PM

## 2015-05-02 ENCOUNTER — Encounter (HOSPITAL_COMMUNITY): Payer: Self-pay | Admitting: Hematology & Oncology

## 2015-05-02 ENCOUNTER — Encounter (HOSPITAL_COMMUNITY): Payer: Self-pay | Admitting: Oncology

## 2015-05-02 ENCOUNTER — Encounter (HOSPITAL_COMMUNITY): Payer: Medicare Other | Attending: Oncology | Admitting: Oncology

## 2015-05-02 VITALS — BP 142/83 | HR 76 | Temp 98.4°F | Resp 20

## 2015-05-02 DIAGNOSIS — C3492 Malignant neoplasm of unspecified part of left bronchus or lung: Secondary | ICD-10-CM

## 2015-05-02 DIAGNOSIS — M25569 Pain in unspecified knee: Secondary | ICD-10-CM | POA: Diagnosis not present

## 2015-05-02 DIAGNOSIS — R042 Hemoptysis: Secondary | ICD-10-CM | POA: Insufficient documentation

## 2015-05-02 DIAGNOSIS — R110 Nausea: Secondary | ICD-10-CM | POA: Diagnosis not present

## 2015-05-02 DIAGNOSIS — C3431 Malignant neoplasm of lower lobe, right bronchus or lung: Secondary | ICD-10-CM | POA: Diagnosis present

## 2015-05-02 DIAGNOSIS — E032 Hypothyroidism due to medicaments and other exogenous substances: Secondary | ICD-10-CM | POA: Insufficient documentation

## 2015-05-02 DIAGNOSIS — C3411 Malignant neoplasm of upper lobe, right bronchus or lung: Secondary | ICD-10-CM | POA: Insufficient documentation

## 2015-05-02 DIAGNOSIS — Z720 Tobacco use: Secondary | ICD-10-CM | POA: Insufficient documentation

## 2015-05-02 NOTE — Patient Instructions (Signed)
[place] at [facility] Discharge Instructions  RECOMMENDATIONS MADE BY THE CONSULTANT AND ANY TEST RESULTS WILL BE SENT TO YOUR REFERRING PHYSICIAN.  We set up an appointment for you to see the [facility] orthopedic doctor. Dr. Eddie Dibbles.  Return next week for your chemo appt. Follow up appt. In 3 weeks.  Thank you for choosing [place] at [facility] to provide your oncology and hematology care.  To afford each patient quality time with our provider, please arrive at least 15 minutes before your scheduled appointment time.    You need to re-schedule your appointment should you arrive 10 or more minutes late.  We strive to give you quality time with our providers, and arriving late affects you and other patients whose appointments are after yours.  Also, if you no show three or more times for appointments you may be dismissed from the clinic at the providers discretion.     Again, thank you for choosing [facility].  Our hope is that these requests will decrease the amount of time that you wait before being seen by our physicians.       _____________________________________________________________  Should you have questions after your visit to [facility], please contact our office at [phone] between the hours of 8:30 a.m. and 4:30 p.m.  Voicemails left after 4:30 p.m. will not be returned until the following business day.  For prescription refill requests, have your pharmacy contact our office.

## 2015-05-02 NOTE — Progress Notes (Signed)
Social Worker returned our phone call about [name] complaints. [name] Ecologist) stated that she would look into these issues and would be visiting her here at her next appointment to talk to [name] personally.

## 2015-05-04 ENCOUNTER — Other Ambulatory Visit (HOSPITAL_COMMUNITY): Payer: Self-pay | Admitting: Oncology

## 2015-05-04 ENCOUNTER — Telehealth (HOSPITAL_COMMUNITY): Payer: Self-pay | Admitting: *Deleted

## 2015-05-04 DIAGNOSIS — C3492 Malignant neoplasm of unspecified part of left bronchus or lung: Secondary | ICD-10-CM

## 2015-05-04 MED ORDER — OXYCODONE-ACETAMINOPHEN 5-325 MG PO TABS: 1.0000 | ORAL_TABLET | ORAL | Status: DC | PRN

## 2015-05-04 MED ORDER — FENTANYL 25 MCG/HR TD PT72: 25.0000 ug | MEDICATED_PATCH | TRANSDERMAL | Status: DC

## 2015-05-04 MED ORDER — FENTANYL 12 MCG/HR TD PT72: 12.0000 ug | MEDICATED_PATCH | TRANSDERMAL | Status: DC

## 2015-05-09 ENCOUNTER — Encounter (HOSPITAL_BASED_OUTPATIENT_CLINIC_OR_DEPARTMENT_OTHER): Payer: Medicare Other

## 2015-05-09 VITALS — BP 158/74 | HR 77 | Temp 98.0°F | Resp 18 | Wt 163.2 lb

## 2015-05-09 DIAGNOSIS — C3490 Malignant neoplasm of unspecified part of unspecified bronchus or lung: Secondary | ICD-10-CM

## 2015-05-09 DIAGNOSIS — E032 Hypothyroidism due to medicaments and other exogenous substances: Secondary | ICD-10-CM | POA: Diagnosis not present

## 2015-05-09 DIAGNOSIS — Z720 Tobacco use: Secondary | ICD-10-CM | POA: Diagnosis not present

## 2015-05-09 DIAGNOSIS — Z5112 Encounter for antineoplastic immunotherapy: Secondary | ICD-10-CM

## 2015-05-09 DIAGNOSIS — C3400 Malignant neoplasm of unspecified main bronchus: Secondary | ICD-10-CM

## 2015-05-09 DIAGNOSIS — C3411 Malignant neoplasm of upper lobe, right bronchus or lung: Secondary | ICD-10-CM | POA: Diagnosis present

## 2015-05-09 DIAGNOSIS — C3431 Malignant neoplasm of lower lobe, right bronchus or lung: Secondary | ICD-10-CM | POA: Diagnosis not present

## 2015-05-09 DIAGNOSIS — R042 Hemoptysis: Secondary | ICD-10-CM | POA: Diagnosis not present

## 2015-05-09 LAB — COMPREHENSIVE METABOLIC PANEL
ALK PHOS: 102 U/L (ref 38–126)
ALT: 10 U/L — AB (ref 14–54)
AST: 18 U/L (ref 15–41)
Albumin: 3.3 g/dL — ABNORMAL LOW (ref 3.5–5.0)
Anion gap: 6 (ref 5–15)
BILIRUBIN TOTAL: 0.4 mg/dL (ref 0.3–1.2)
BUN: 6 mg/dL (ref 6–20)
CALCIUM: 8.3 mg/dL — AB (ref 8.9–10.3)
CO2: 33 mmol/L — ABNORMAL HIGH (ref 22–32)
Chloride: 98 mmol/L — ABNORMAL LOW (ref 101–111)
Creatinine, Ser: 0.8 mg/dL (ref 0.44–1.00)
GFR calc non Af Amer: >60 mL/min (ref >60)
GLUCOSE: 60 mg/dL — AB (ref 70–99)
POTASSIUM: 4 mmol/L (ref 3.5–5.1)
Sodium: 137 mmol/L (ref 135–145)
Total Protein: 6.2 g/dL — ABNORMAL LOW (ref 6.5–8.1)

## 2015-05-09 LAB — TSH: TSH: 46.07 u[IU]/mL — ABNORMAL HIGH (ref 0.350–4.500)

## 2015-05-09 LAB — CBC WITH DIFFERENTIAL/PLATELET
BASOS ABS: 0 10*3/uL (ref 0.0–0.1)
Basophils Relative: 1 % (ref 0–1)
EOS ABS: 0.1 10*3/uL (ref 0.0–0.7)
Eosinophils Relative: 1 % (ref 0–5)
HCT: 33.7 % — ABNORMAL LOW (ref 36.0–46.0)
Hemoglobin: 10.6 g/dL — ABNORMAL LOW (ref 12.0–15.0)
LYMPHS ABS: 0.8 10*3/uL (ref 0.7–4.0)
Lymphocytes Relative: 14 % (ref 12–46)
MCH: 31.4 pg (ref 26.0–34.0)
MCHC: 31.5 g/dL (ref 30.0–36.0)
MCV: 99.7 fL (ref 78.0–100.0)
Monocytes Absolute: 0.4 10*3/uL (ref 0.1–1.0)
Monocytes Relative: 6 % (ref 3–12)
NEUTROS PCT: 78 % — AB (ref 43–77)
Neutro Abs: 4.6 10*3/uL (ref 1.7–7.7)
Platelets: 210 10*3/uL (ref 150–400)
RBC: 3.38 MIL/uL — ABNORMAL LOW (ref 3.87–5.11)
RDW: 13.6 % (ref 11.5–15.5)
WBC: 5.8 10*3/uL (ref 4.0–10.5)

## 2015-05-09 MED ORDER — SODIUM CHLORIDE 0.9 % IJ SOLN
10.0000 mL | INTRAMUSCULAR | Status: DC | PRN
  Administered 2015-05-09: 10 mL
  Filled 2015-05-09: qty 10

## 2015-05-09 MED ORDER — SODIUM CHLORIDE 0.9 % IV SOLN
Freq: Once | INTRAVENOUS | Status: AC
  Administered 2015-05-09: 13:00:00 via INTRAVENOUS

## 2015-05-09 MED ORDER — HEPARIN SOD (PORK) LOCK FLUSH 100 UNIT/ML IV SOLN
500.0000 [IU] | Freq: Once | INTRAVENOUS | Status: AC | PRN
Start: 2015-05-09 — End: 2015-05-09
  Administered 2015-05-09: 500 [IU]
  Filled 2015-05-09: qty 5

## 2015-05-09 MED ORDER — SODIUM CHLORIDE 0.9 % IV SOLN
3.0000 mg/kg | Freq: Once | INTRAVENOUS | Status: AC
  Administered 2015-05-09: 220 mg via INTRAVENOUS
  Filled 2015-05-09: qty 18

## 2015-05-09 NOTE — Progress Notes (Unsigned)
Blood glucose 60; pt given sandwich, chips, and cookie for lunch with sweet tea to drink. Alert, oriented x 4; answers questions appropriately.   1420:  Tolerated infusion w/o adverse reaction; a&ox4; VSS; discharged via wheelchair; left in c/o [facility] employee for return transport to facility.

## 2015-05-10 ENCOUNTER — Other Ambulatory Visit (HOSPITAL_COMMUNITY): Payer: Self-pay | Admitting: Psychiatry

## 2015-05-10 ENCOUNTER — Other Ambulatory Visit (HOSPITAL_COMMUNITY): Payer: Self-pay | Admitting: Oncology

## 2015-05-11 ENCOUNTER — Other Ambulatory Visit (HOSPITAL_COMMUNITY): Payer: Self-pay | Admitting: Oncology

## 2015-05-11 DIAGNOSIS — E032 Hypothyroidism due to medicaments and other exogenous substances: Secondary | ICD-10-CM

## 2015-05-11 MED ORDER — LEVOTHYROXINE SODIUM 100 MCG PO TABS: 100.0000 ug | ORAL_TABLET | Freq: Every day | ORAL | Status: DC

## 2015-05-17 ENCOUNTER — Other Ambulatory Visit (HOSPITAL_COMMUNITY): Payer: Self-pay | Admitting: Oncology

## 2015-05-17 DIAGNOSIS — C3492 Malignant neoplasm of unspecified part of left bronchus or lung: Secondary | ICD-10-CM

## 2015-05-17 MED ORDER — OXYCODONE-ACETAMINOPHEN 5-325 MG PO TABS
1.0000 | ORAL_TABLET | ORAL | Status: DC | PRN
Start: 2015-05-17 — End: 2015-05-30

## 2015-05-23 ENCOUNTER — Encounter (HOSPITAL_BASED_OUTPATIENT_CLINIC_OR_DEPARTMENT_OTHER): Payer: Medicare Other | Admitting: Oncology

## 2015-05-23 ENCOUNTER — Encounter (HOSPITAL_COMMUNITY): Payer: Medicare Other | Attending: Hematology & Oncology

## 2015-05-23 VITALS — BP 171/70 | HR 66 | Temp 97.8°F | Resp 20 | Wt 170.0 lb

## 2015-05-23 DIAGNOSIS — R600 Localized edema: Secondary | ICD-10-CM | POA: Diagnosis not present

## 2015-05-23 DIAGNOSIS — Z5112 Encounter for antineoplastic immunotherapy: Secondary | ICD-10-CM

## 2015-05-23 DIAGNOSIS — C3490 Malignant neoplasm of unspecified part of unspecified bronchus or lung: Secondary | ICD-10-CM

## 2015-05-23 DIAGNOSIS — C3492 Malignant neoplasm of unspecified part of left bronchus or lung: Secondary | ICD-10-CM | POA: Insufficient documentation

## 2015-05-23 DIAGNOSIS — R0602 Shortness of breath: Secondary | ICD-10-CM | POA: Diagnosis not present

## 2015-05-23 LAB — CBC WITH DIFFERENTIAL/PLATELET
BASOS PCT: 1 % (ref 0–1)
Basophils Absolute: 0 10*3/uL (ref 0.0–0.1)
EOS PCT: 1 % (ref 0–5)
Eosinophils Absolute: 0.1 10*3/uL (ref 0.0–0.7)
HEMATOCRIT: 33.6 % — AB (ref 36.0–46.0)
HEMOGLOBIN: 10.6 g/dL — AB (ref 12.0–15.0)
LYMPHS PCT: 11 % — AB (ref 12–46)
Lymphs Abs: 0.5 10*3/uL — ABNORMAL LOW (ref 0.7–4.0)
MCH: 30.5 pg (ref 26.0–34.0)
MCHC: 31.5 g/dL (ref 30.0–36.0)
MCV: 96.6 fL (ref 78.0–100.0)
Monocytes Absolute: 0.4 10*3/uL (ref 0.1–1.0)
Monocytes Relative: 8 % (ref 3–12)
NEUTROS ABS: 3.9 10*3/uL (ref 1.7–7.7)
NEUTROS PCT: 79 % — AB (ref 43–77)
Platelets: 197 10*3/uL (ref 150–400)
RBC: 3.48 MIL/uL — AB (ref 3.87–5.11)
RDW: 13.7 % (ref 11.5–15.5)
WBC: 4.8 10*3/uL (ref 4.0–10.5)

## 2015-05-23 LAB — COMPREHENSIVE METABOLIC PANEL
ALBUMIN: 3.4 g/dL — AB (ref 3.5–5.0)
ALT: 12 U/L — ABNORMAL LOW (ref 14–54)
ANION GAP: 9 (ref 5–15)
AST: 21 U/L (ref 15–41)
Alkaline Phosphatase: 107 U/L (ref 38–126)
BUN: 7 mg/dL (ref 6–20)
CO2: 33 mmol/L — AB (ref 22–32)
CREATININE: 0.6 mg/dL (ref 0.44–1.00)
Calcium: 8.4 mg/dL — ABNORMAL LOW (ref 8.9–10.3)
Chloride: 96 mmol/L — ABNORMAL LOW (ref 101–111)
Glucose, Bld: 110 mg/dL — ABNORMAL HIGH (ref 65–99)
POTASSIUM: 3.8 mmol/L (ref 3.5–5.1)
SODIUM: 138 mmol/L (ref 135–145)
TOTAL PROTEIN: 6.3 g/dL — AB (ref 6.5–8.1)
Total Bilirubin: 0.6 mg/dL (ref 0.3–1.2)

## 2015-05-23 LAB — TSH: TSH: 33.419 u[IU]/mL — ABNORMAL HIGH (ref 0.350–4.500)

## 2015-05-23 MED ORDER — IPRATROPIUM-ALBUTEROL 0.5-2.5 (3) MG/3ML IN SOLN
3.0000 mL | Freq: Once | RESPIRATORY_TRACT | Status: AC
  Administered 2015-05-23: 3 mL via RESPIRATORY_TRACT
  Filled 2015-05-23 (×2): qty 3

## 2015-05-23 MED ORDER — SPIRONOLACTONE 50 MG PO TABS: 100.0000 mg | ORAL_TABLET | Freq: Every day | ORAL | Status: DC

## 2015-05-23 MED ORDER — SODIUM CHLORIDE 0.9 % IJ SOLN
10.0000 mL | INTRAMUSCULAR | Status: DC | PRN
  Administered 2015-05-23: 10 mL
  Filled 2015-05-23: qty 10

## 2015-05-23 MED ORDER — HEPARIN SOD (PORK) LOCK FLUSH 100 UNIT/ML IV SOLN
500.0000 [IU] | Freq: Once | INTRAVENOUS | Status: AC | PRN
  Administered 2015-05-23: 500 [IU]

## 2015-05-23 MED ORDER — SODIUM CHLORIDE 0.9 % IV SOLN
3.0000 mg/kg | Freq: Once | INTRAVENOUS | Status: AC
  Administered 2015-05-23: 220 mg via INTRAVENOUS
  Filled 2015-05-23: qty 16

## 2015-05-23 MED ORDER — SODIUM CHLORIDE 0.9 % IV SOLN
Freq: Once | INTRAVENOUS | Status: AC
  Administered 2015-05-23: 14:00:00 via INTRAVENOUS

## 2015-05-23 NOTE — Progress Notes (Signed)
Reports feeling short of breath; states, "I'm feeling like I can't catch my breath again".  Requesting a "rescue inhaler" or oxygen.  SpO2 94% RA.  [place], [place] notified of pt complaint and request.  Order for duoneb received.   1245:  Denies feeling short of breath after neb tx. A&ox4; in no distress.   1450:  Tolerated infusion w/o adverse reaction; VSS; in no distress; left in c/o [employer] staff for transport home; discharged via wheelchair.

## 2015-05-23 NOTE — Patient Instructions (Addendum)
[place] at [facility] Discharge Instructions  RECOMMENDATIONS MADE BY THE CONSULTANT AND ANY TEST RESULTS WILL BE SENT TO YOUR REFERRING PHYSICIAN.  Exam and discussion by Robynn Pane, PA-C Will give you a breathing treatment while you are here. Keep your legs elevated while you are sitting. Spironolactone for the swelling - take as directed.  Follow-up in 2 weeks.   Thank you for choosing [place] at [facility] to provide your oncology and hematology care.  To afford each patient quality time with our provider, please arrive at least 15 minutes before your scheduled appointment time.    You need to re-schedule your appointment should you arrive 10 or more minutes late.  We strive to give you quality time with our providers, and arriving late affects you and other patients whose appointments are after yours.  Also, if you no show three or more times for appointments you may be dismissed from the clinic at the providers discretion.     Again, thank you for choosing [facility].  Our hope is that these requests will decrease the amount of time that you wait before being seen by our physicians.       _____________________________________________________________  Should you have questions after your visit to [facility], please contact our office at [phone] between the hours of 8:30 a.m. and 4:30 p.m.  Voicemails left after 4:30 p.m. will not be returned until the following business day.  For prescription refill requests, have your pharmacy contact our office.

## 2015-05-23 NOTE — Patient Instructions (Signed)
[facility] Discharge Instructions for Patients Receiving Chemotherapy  Today you received the following chemotherapy agents: nivolumab  If you develop nausea and vomiting, or diarrhea that is not controlled by your medication, call the clinic.  The clinic phone number is [phone]. Office hours are Monday-Friday 8:30am-5:00pm.  BELOW ARE SYMPTOMS THAT SHOULD BE REPORTED IMMEDIATELY:  *FEVER GREATER THAN 101.0 F  *CHILLS WITH OR WITHOUT FEVER  NAUSEA AND VOMITING THAT IS NOT CONTROLLED WITH YOUR NAUSEA MEDICATION  *UNUSUAL SHORTNESS OF BREATH  *UNUSUAL BRUISING OR BLEEDING  TENDERNESS IN MOUTH AND THROAT WITH OR WITHOUT PRESENCE OF ULCERS  *URINARY PROBLEMS  *BOWEL PROBLEMS  UNUSUAL RASH Items with * indicate a potential emergency and should be followed up as soon as possible. If you have an emergency after office hours please contact your primary care physician or go to the nearest emergency department.  Please call the clinic during office hours if you have any questions or concerns.   You may also contact the Patient Navigator at [phone] should you have any questions or need assistance in obtaining follow up care. _____________________________________________________________________ Have you asked about our STAR program?    STAR stands for Survivorship Training and Rehabilitation, and this is a nationally recognized cancer care program that focuses on survivorship and rehabilitation.  Cancer and cancer treatments may cause problems, such as, pain, making you feel tired and keeping you from doing the things that you need or want to do. Cancer rehabilitation can help. Our goal is to reduce these troubling effects and help you have the best quality of life possible.  You may receive a survey from a nurse that asks questions about your current state of health.  Based on the survey results, all eligible patients will be referred to the [facility] program for an  evaluation so we can better serve you! A frequently asked questions sheet is available upon request.

## 2015-05-23 NOTE — Assessment & Plan Note (Addendum)
Stage IV squamous cell carcinoma of the lung. On salvage Nivolumab every 2 weeks with good tolerability.  Pre-chemo labs today as ordered.  I will defer her concerns to her primary care provider.  I will give her a 5 day course of Spironolactone to help with her LE edema.    No other changes in her oncology care at this time.  Return in 2 weeks for follow-up and next treatment.

## 2015-05-23 NOTE — Progress Notes (Signed)
Gabriela Cahill, MD  [place] [address]  Squamous cell lung cancer, left  Bilateral lower extremity edema - Plan: spironolactone (ALDACTONE) 50 MG tablet  CURRENT THERAPY: Immunotherapy with Nivolumab 3 mg/KG every 2 weeks.  INTERVAL HISTORY: [name] 69 y.o. female returns for followup of stage IV squamous cell carcinoma of the lung. She was diagnosed with earlier stage disease back in 2014. She was treated in [place]. She has had multiple hospital admissions beginning late last year. She has chronic pain and reportedly issues with pain medications in the past. She is currently residing in a local nursing home. She was admitted to [facility] twice in March. Once for DKA and a second time for presumed benzodiazepine withdrawal seizure.  I personally reviewed and went over laboratory results with the patient.  The results are noted within this dictation.  Her labs today are pending.  She reports that she saw her orthopod and he wants to revise her right shoulder.  I do not have any documentation at this time for confirmation of this.  She notes that was given knee braces and could not tolerate the discomfort after wearing for 10 minutes.  She reports, "I am concerned about congestive heart failure."  She notes that her legs are swelling.  She reports that in the middle of the night, she wakes up short of breath.  I will defer this to her primary care provider since the patient has a difficult time with reality and accurately portraying her issues.  She notes an 8/10 on the pain scale today, but she does not appear to be in pain.  She is on a long acting medication and I will not change her pain medications.  Oncologically, she is tolerating salvage therapy well.  Past Medical History  Diagnosis Date  . H/O: stroke 2012, 2000,     X3  . Anxiety   . PTSD (post-traumatic stress disorder)   . Depression   . SVD (spontaneous vaginal delivery)     x 1  .  Smoker   . Osteoarthritis     knees, hips hands,   . Shortness of breath   . Pain     SEVERE PAIN BACK, RIGHT HIP AND KNEES--HARRINGTON RODS AND CERVICAL PLATES--USES WALKER OR CANE WHEN AMBULATING - GOES TO A PAIN CLINIC-STATES SHE NEEDS RT HIP AND BOTH KNEES REPLACED. PT STATES SHE WAS TOLD THE CEMENT AROUND THE HARRINGTON RODS IS CRACKED.  [name] Hot flashes, menopausal     SEVERE  . Complication of anesthesia 03/02/13    severe headache,vertigo postop  . COPD (chronic obstructive pulmonary disease)     stable  . 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 Lung cancer   . Status post radiation therapy within four to twelve weeks 10/18/13-12/06/13    lung ca  66Gy  . Family history of anesthesia complication     equipment failure caused problem, suffered a stroke & heartattack  . Diabetes mellitus, type II   . Diabetes mellitus without complication   . Cancer   . Hypertension   . COPD (chronic obstructive pulmonary disease)   . Squamous cell lung cancer 10/02/2013    has OTHER CLOSED FRACTURE OF TARSAL&METATARSAL BONES; Postmenopausal bleeding; Smoking addiction; HTN (hypertension); Depression; Anxiety; VIN III (vulvar intraepithelial neoplasia III); COPD GOLD III; History of stroke; Essential hypertension, benign; Arthralgia of hip; Hemoptysis; Pneumonia; Chronic pain; Right lower lobe lung mass, Sq cell  Ca ? 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     is allergic to carafate; prednisone; nsaids; aspirin; aspirin; ibuprofen; nsaids;  and dulera.  [name] does not currently have medications on file.  Past Surgical History  Procedure Laterality Date  . Carpal tunnel release      BILATERAL  . Nasal sinus surgery    . Lumbar fusion      L4-S1  . Cervical fusion    . Hernia repair    . Salpingoophorectomy Right   . Vaginal hysterectomy      TAH w/ ovary removal  . Transthoracic echocardiogram  12-03-2011    LVSF NORMAL/ EF 60-65%  . Vulvectomy N/A 03/02/2013    Procedure: WIDE LOCAL EXCISION VULVAR;  Surgeon: Imagene Gurney A. Alycia Rossetti, MD;  Location: [facility];  Service: Gynecology;  Laterality: N/A;  . Co2 laser application N/A 1/54/0086    Procedure: LASER APPLICATION OF THE VULVA;  Surgeon: Janie Morning, MD;  Location[facility];  Service: Gynecology;  Laterality: N/A;  . Video bronchoscopy Bilateral 09/23/2013    Procedure: VIDEO BRONCHOSCOPY WITHOUT FLUORO;  Surgeon: Tanda Rockers, MD;  Location: [facility];  Service: Cardiopulmonary;  Laterality: Bilateral;  . Hemiarthroplasty shoulder fracture Left 05/25/2014    DR CHANDLER  . Reverse shoulder arthroplasty Left 05/24/2014    Procedure:   Hemi-Arthroplasty  Left Shoulder;  Surgeon: Nita Sells, MD;  Location: [facility];  Service: Orthopedics;  Laterality: Left;  . Orif humerus fracture Right 12/12/2014    Procedure: OPEN REDUCTION INTERNAL FIXATION (ORIF) PROXIMAL HUMERUS FRACTURE;  Surgeon: Nita Sells, MD;  Location: [place];  Service: Orthopedics;  Laterality: Right;    Denies any headaches, dizziness, double vision, fevers, chills, night sweats, nausea, vomiting, diarrhea, constipation, chest pain, heart palpitations, shortness of breath, blood in stool, black tarry stool, urinary pain, urinary burning, urinary frequency, hematuria.   PHYSICAL EXAMINATION  ECOG PERFORMANCE STATUS: 1 - Symptomatic but completely ambulatory  There were no vitals filed for this visit.  GENERAL:alert, no distress, well nourished, well developed,  comfortable, cooperative and smiling SKIN: skin color, texture, turgor are normal, no rashes or significant lesions HEAD: Normocephalic, No masses, lesions, tenderness or abnormalities EYES: normal, PERRLA, EOMI, Conjunctiva are pink and non-injected EARS: External ears normal OROPHARYNX:lips, buccal mucosa, and tongue normal and mucous membranes are moist  NECK: supple, no adenopathy, thyroid normal size, non-tender, without nodularity, trachea midline LYMPH:  no palpable lymphadenopathy BREAST:not examined LUNGS: expiratory wheezing bilaterally with some rhonchi, decreased breath sounds HEART: regular rate & rhythm, no murmurs and no gallops ABDOMEN:abdomen soft and normal bowel sounds BACK: Back symmetric, no curvature. EXTREMITIES:less then 2 second capillary refill, no joint deformities, effusion, or inflammation, no skin discoloration, no cyanosis  NEURO: alert & oriented x 3 with fluent speech, no focal motor/sensory deficits, gait normal   LABORATORY DATA: CBC    Component Value Date/Time   WBC 5.8 05/09/2015 1045   WBC 5.1 12/09/2014 1100   RBC 3.38* 05/09/2015 1045   RBC 4.12 12/09/2014 1100   HGB 10.6* 05/09/2015 1045   HGB 13.1 12/09/2014  1100   HCT 33.7* 05/09/2015 1045   HCT 40.7 12/09/2014 1100   PLT 210 05/09/2015 1045   PLT 174 12/09/2014 1100   MCV 99.7 05/09/2015 1045   MCV 98.9 12/09/2014 1100   MCH 31.4 05/09/2015 1045   MCH 31.8 12/09/2014 1100   MCHC 31.5 05/09/2015 1045   MCHC 32.2 12/09/2014 1100   RDW 13.6 05/09/2015 1045   RDW 15.5* 12/09/2014 1100   LYMPHSABS 0.8 05/09/2015 1045   LYMPHSABS 0.9 12/09/2014 1100   MONOABS 0.4 05/09/2015 1045   MONOABS 0.3 12/09/2014 1100   EOSABS 0.1 05/09/2015 1045   EOSABS 0.1 12/09/2014 1100   BASOSABS 0.0 05/09/2015 1045   BASOSABS 0.1 12/09/2014 1100      Chemistry      Component Value Date/Time   NA 137 05/09/2015 1045   NA 134* 12/09/2014 1100   K 4.0 05/09/2015 1045   K 4.2 12/09/2014 1100   CL  98* 05/09/2015 1045   CO2 33* 05/09/2015 1045   CO2 27 12/09/2014 1100   BUN 6 05/09/2015 1045   BUN 8.8 12/09/2014 1100   CREATININE 0.80 05/09/2015 1045   CREATININE 0.9 12/09/2014 1100      Component Value Date/Time   CALCIUM 8.3* 05/09/2015 1045   CALCIUM 8.4 12/09/2014 1100   ALKPHOS 102 05/09/2015 1045   ALKPHOS 138 12/09/2014 1100   AST 18 05/09/2015 1045   AST 15 12/09/2014 1100   ALT 10* 05/09/2015 1045   ALT 12 12/09/2014 1100   BILITOT 0.4 05/09/2015 1045   BILITOT 0.52 12/09/2014 1100       RADIOGRAPHIC STUDIES:  Ir Fluoro Guide Cv Line Left  04/24/2015   CLINICAL DATA:  Lung neoplasm  EXAM: TUNNEL POWER PORT PLACEMENT WITH SUBCUTANEOUS POCKET UTILIZING ULTRASOUND & FLOUROSCOPY  FLUOROSCOPY TIME:  1 minutes and 6 seconds.  MEDICATIONS AND MEDICAL HISTORY: Versed 2 mg, Fentanyl 50 mcg.  As antibiotic prophylaxis, Ancef was ordered pre-procedure and administered intravenously within one hour of incision.  ANESTHESIA/SEDATION: Moderate sedation time: 30 minutes  CONTRAST:  None  PROCEDURE: After written informed consent was obtained, patient was placed in the supine position on angiographic table. The left neck and chest was prepped and draped in a sterile fashion. Lidocaine was utilized for local anesthesia. The left internal jugular vein was noted to be patent initially with ultrasound. Under sonographic guidance, a micropuncture needle was inserted into the left IJ vein (Ultrasound and fluoroscopic image documentation was performed). The needle was removed over an 018 wire which was exchanged for a Amplatz. This was advanced into the IVC. An 8-French dilator was advanced over the Amplatz.  A small incision was made in the left upper chest over the anterior left second rib. Utilizing blunt dissection, a subcutaneous pocket was created in the caudal direction. The pocket was irrigated with a copious amount of sterile normal saline. The port catheter was tunneled from the chest  incision, and out the neck incision. The reservoir was inserted into the subcutaneous pocket and secured with two 3-0 Ethilon stitches. A peel-away sheath was advanced over the Amplatz wire. The port catheter was cut to measure length and inserted through the peel-away sheath. The peel-away sheath was removed. The chest incision was closed with 3-0 Vicryl interrupted stitches for the subcutaneous tissue and a running of 4-0 Vicryl subcuticular stitch for the skin. The neck incision was closed with a 4-0 Vicryl subcuticular stitch. Derma-bond was applied to both surgical incisions. The port reservoir was flushed and  instilled with heparinized saline. No complications.  FINDINGS: A left IJ vein Port-A-Cath is in place with its tip at the cavoatrial junction.  COMPLICATIONS: None  IMPRESSION: Successful 8 French left internal jugular vein power port placement with its tip at the SVC/RA junction.   Electronically Signed   By: Marybelle Killings M.D.   On: 04/24/2015 17:36   Ir US Guide Vasc Access Left  04/24/2015   CLINICAL DATA:  Lung neoplasm  EXAM: TUNNEL POWER PORT PLACEMENT WITH SUBCUTANEOUS POCKET UTILIZING ULTRASOUND & FLOUROSCOPY  FLUOROSCOPY TIME:  1 minutes and 6 seconds.  MEDICATIONS AND MEDICAL HISTORY: Versed 2 mg, Fentanyl 50 mcg.  As antibiotic prophylaxis, Ancef was ordered pre-procedure and administered intravenously within one hour of incision.  ANESTHESIA/SEDATION: Moderate sedation time: 30 minutes  CONTRAST:  None  PROCEDURE: After written informed consent was obtained, patient was placed in the supine position on angiographic table. The left neck and chest was prepped and draped in a sterile fashion. Lidocaine was utilized for local anesthesia. The left internal jugular vein was noted to be patent initially with ultrasound. Under sonographic guidance, a micropuncture needle was inserted into the left IJ vein (Ultrasound and fluoroscopic image documentation was performed). The needle was removed over  an 018 wire which was exchanged for a Amplatz. This was advanced into the IVC. An 8-French dilator was advanced over the Amplatz.  A small incision was made in the left upper chest over the anterior left second rib. Utilizing blunt dissection, a subcutaneous pocket was created in the caudal direction. The pocket was irrigated with a copious amount of sterile normal saline. The port catheter was tunneled from the chest incision, and out the neck incision. The reservoir was inserted into the subcutaneous pocket and secured with two 3-0 Ethilon stitches. A peel-away sheath was advanced over the Amplatz wire. The port catheter was cut to measure length and inserted through the peel-away sheath. The peel-away sheath was removed. The chest incision was closed with 3-0 Vicryl interrupted stitches for the subcutaneous tissue and a running of 4-0 Vicryl subcuticular stitch for the skin. The neck incision was closed with a 4-0 Vicryl subcuticular stitch. Derma-bond was applied to both surgical incisions. The port reservoir was flushed and instilled with heparinized saline. No complications.  FINDINGS: A left IJ vein Port-A-Cath is in place with its tip at the cavoatrial junction.  COMPLICATIONS: None  IMPRESSION: Successful 8 French left internal jugular vein power port placement with its tip at the SVC/RA junction.   Electronically Signed   By: Marybelle Killings M.D.   On: 04/24/2015 17:36     ASSESSMENT AND PLAN:  Squamous cell lung cancer Stage IV squamous cell carcinoma of the lung. On salvage Nivolumab every 2 weeks with good tolerability.  Pre-chemo labs today as ordered.  I will defer her concerns to her primary care provider.  I will give her a 5 day course of Spironolactone to help with her LE edema.    No other changes in her oncology care at this time.  Return in 2 weeks for follow-up and next treatment.      THERAPY PLAN:  Continue with therapy as planned in a salvage setting.  All questions were  answered. The patient knows to call the clinic with any problems, questions or concerns. We can certainly see the patient much sooner if necessary.  Patient and plan discussed with Dr. Ancil Linsey and she is in agreement with the aforementioned.   This note is electronically  signed by: Robynn Pane 05/23/2015 11:47 AM

## 2015-05-30 ENCOUNTER — Other Ambulatory Visit (HOSPITAL_COMMUNITY): Payer: Self-pay | Admitting: Oncology

## 2015-05-30 ENCOUNTER — Telehealth (HOSPITAL_COMMUNITY): Payer: Self-pay | Admitting: *Deleted

## 2015-05-30 DIAGNOSIS — C3492 Malignant neoplasm of unspecified part of left bronchus or lung: Secondary | ICD-10-CM

## 2015-05-30 MED ORDER — OXYCODONE-ACETAMINOPHEN 5-325 MG PO TABS: 1.0000 | ORAL_TABLET | ORAL | Status: DC | PRN

## 2015-05-30 NOTE — Telephone Encounter (Signed)
Pt called at 11:38am stating that the last time she came into office to see Dr. Harrington Challenger, her Aid (from the nursing home she is in) was in the room during her session. Per pt she thought if was her [employer] of right to have who she wants in the room? Per pt, she would like for Dr. Harrington Challenger to tell the Aid next time to not come into her session due to her complains being the home she is living in. Per pt she is not happy about the situation. Pt [phone]. Per pt, she do not want the nursing home to know she called due to her possibly losing more of her privileges. Per pt chart, she had signed a release and it is scanned in.

## 2015-05-30 NOTE — Telephone Encounter (Signed)
noted 

## 2015-05-31 ENCOUNTER — Encounter (HOSPITAL_COMMUNITY): Payer: Self-pay | Admitting: Psychiatry

## 2015-05-31 ENCOUNTER — Ambulatory Visit (INDEPENDENT_AMBULATORY_CARE_PROVIDER_SITE_OTHER): Payer: Medicare Other | Admitting: Psychiatry

## 2015-05-31 ENCOUNTER — Other Ambulatory Visit: Payer: Self-pay | Admitting: Orthopedic Surgery

## 2015-05-31 VITALS — BP 161/90 | HR 71 | Ht 66.0 in | Wt 154.0 lb

## 2015-05-31 DIAGNOSIS — F329 Major depressive disorder, single episode, unspecified: Secondary | ICD-10-CM | POA: Diagnosis not present

## 2015-05-31 DIAGNOSIS — F331 Major depressive disorder, recurrent, moderate: Secondary | ICD-10-CM

## 2015-05-31 DIAGNOSIS — F411 Generalized anxiety disorder: Secondary | ICD-10-CM | POA: Diagnosis not present

## 2015-05-31 MED ORDER — ALPRAZOLAM 2 MG PO TABS: 2.0000 mg | ORAL_TABLET | Freq: Three times a day (TID) | ORAL | Status: DC

## 2015-05-31 MED ORDER — TRAZODONE HCL 150 MG PO TABS: 300.0000 mg | ORAL_TABLET | Freq: Every day | ORAL | Status: AC

## 2015-05-31 MED ORDER — SERTRALINE HCL 100 MG PO TABS
200.0000 mg | ORAL_TABLET | Freq: Every day | ORAL | Status: AC
Start: 2015-05-31 — End: ?
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
## 2015-06-06 ENCOUNTER — Encounter (HOSPITAL_COMMUNITY): Payer: Medicare Other | Attending: Oncology

## 2015-06-06 ENCOUNTER — Encounter (HOSPITAL_BASED_OUTPATIENT_CLINIC_OR_DEPARTMENT_OTHER): Payer: Medicare Other | Admitting: Oncology

## 2015-06-06 ENCOUNTER — Encounter (HOSPITAL_COMMUNITY): Payer: Self-pay | Admitting: Oncology

## 2015-06-06 VITALS — BP 151/74 | HR 73 | Temp 98.3°F | Resp 18

## 2015-06-06 VITALS — BP 140/57 | HR 69 | Temp 98.5°F | Resp 18 | Wt 155.0 lb

## 2015-06-06 DIAGNOSIS — E032 Hypothyroidism due to medicaments and other exogenous substances: Secondary | ICD-10-CM | POA: Diagnosis not present

## 2015-06-06 DIAGNOSIS — C3490 Malignant neoplasm of unspecified part of unspecified bronchus or lung: Secondary | ICD-10-CM

## 2015-06-06 DIAGNOSIS — C3431 Malignant neoplasm of lower lobe, right bronchus or lung: Secondary | ICD-10-CM

## 2015-06-06 DIAGNOSIS — R042 Hemoptysis: Secondary | ICD-10-CM | POA: Diagnosis not present

## 2015-06-06 DIAGNOSIS — Z5112 Encounter for antineoplastic immunotherapy: Secondary | ICD-10-CM

## 2015-06-06 DIAGNOSIS — Z720 Tobacco use: Secondary | ICD-10-CM | POA: Diagnosis not present

## 2015-06-06 DIAGNOSIS — C3411 Malignant neoplasm of upper lobe, right bronchus or lung: Secondary | ICD-10-CM | POA: Diagnosis present

## 2015-06-06 DIAGNOSIS — C3492 Malignant neoplasm of unspecified part of left bronchus or lung: Secondary | ICD-10-CM

## 2015-06-06 LAB — COMPREHENSIVE METABOLIC PANEL
ALT: 7 U/L — ABNORMAL LOW (ref 14–54)
AST: 16 U/L (ref 15–41)
Albumin: 3.4 g/dL — ABNORMAL LOW (ref 3.5–5.0)
Alkaline Phosphatase: 93 U/L (ref 38–126)
Anion gap: 8 (ref 5–15)
BUN: 5 mg/dL — AB (ref 6–20)
CO2: 32 mmol/L (ref 22–32)
CREATININE: 0.59 mg/dL (ref 0.44–1.00)
Calcium: 8.4 mg/dL — ABNORMAL LOW (ref 8.9–10.3)
Chloride: 94 mmol/L — ABNORMAL LOW (ref 101–111)
GFR calc non Af Amer: >60 mL/min (ref >60)
Glucose, Bld: 194 mg/dL — ABNORMAL HIGH (ref 65–99)
POTASSIUM: 4 mmol/L (ref 3.5–5.1)
Sodium: 134 mmol/L — ABNORMAL LOW (ref 135–145)
Total Bilirubin: 0.8 mg/dL (ref 0.3–1.2)
Total Protein: 6.5 g/dL (ref 6.5–8.1)

## 2015-06-06 LAB — CBC WITH DIFFERENTIAL/PLATELET
Basophils Absolute: 0 10*3/uL (ref 0.0–0.1)
Basophils Relative: 0 % (ref 0–1)
EOS ABS: 0.1 10*3/uL (ref 0.0–0.7)
EOS PCT: 1 % (ref 0–5)
HEMATOCRIT: 36.2 % (ref 36.0–46.0)
Hemoglobin: 11.4 g/dL — ABNORMAL LOW (ref 12.0–15.0)
Lymphocytes Relative: 9 % — ABNORMAL LOW (ref 12–46)
Lymphs Abs: 0.8 10*3/uL (ref 0.7–4.0)
MCH: 29.5 pg (ref 26.0–34.0)
MCHC: 31.5 g/dL (ref 30.0–36.0)
MCV: 93.5 fL (ref 78.0–100.0)
Monocytes Absolute: 0.5 10*3/uL (ref 0.1–1.0)
Monocytes Relative: 6 % (ref 3–12)
NEUTROS ABS: 7.3 10*3/uL (ref 1.7–7.7)
NEUTROS PCT: 84 % — AB (ref 43–77)
Platelets: 207 10*3/uL (ref 150–400)
RBC: 3.87 MIL/uL (ref 3.87–5.11)
RDW: 13.9 % (ref 11.5–15.5)
WBC: 8.8 10*3/uL (ref 4.0–10.5)

## 2015-06-06 MED ORDER — SODIUM CHLORIDE 0.9 % IV SOLN
3.0000 mg/kg | Freq: Once | INTRAVENOUS | Status: AC
  Administered 2015-06-06: 220 mg via INTRAVENOUS
  Filled 2015-06-06: qty 18

## 2015-06-06 MED ORDER — FENTANYL 25 MCG/HR TD PT72: 25.0000 ug | MEDICATED_PATCH | TRANSDERMAL | Status: DC

## 2015-06-06 MED ORDER — SODIUM CHLORIDE 0.9 % IJ SOLN
10.0000 mL | INTRAMUSCULAR | Status: DC | PRN
  Administered 2015-06-06: 10 mL
  Filled 2015-06-06: qty 10

## 2015-06-06 MED ORDER — SODIUM CHLORIDE 0.9 % IV SOLN
Freq: Once | INTRAVENOUS | Status: AC
  Administered 2015-06-06: 12:00:00 via INTRAVENOUS

## 2015-06-06 MED ORDER — HEPARIN SOD (PORK) LOCK FLUSH 100 UNIT/ML IV SOLN
500.0000 [IU] | Freq: Once | INTRAVENOUS | Status: AC | PRN
  Administered 2015-06-06: 500 [IU]
  Filled 2015-06-06: qty 5

## 2015-06-06 NOTE — Assessment & Plan Note (Addendum)
Stage IV squamous cell carcinoma of the lung. On salvage Nivolumab every 2 weeks with good tolerability.  Pre-chemo labs today as ordered.  Labs meet treatment parameters and therefore, she will be treated today.  Refill on Fentanyl patches today without dose change.  She will be due for restaging scans in the future.    Her LE edema is much improved with a 1 week course of spironolactone last week.  Return in 2 weeks for follow-up and next treatment.

## 2015-06-06 NOTE — Progress Notes (Signed)
[name] Tolerated chemotherapy well today Discharged via wheelchair

## 2015-06-06 NOTE — Patient Instructions (Addendum)
[place] at [facility] Discharge Instructions  RECOMMENDATIONS MADE BY THE CONSULTANT AND ANY TEST RESULTS WILL BE SENT TO YOUR REFERRING PHYSICIAN.  Office visit with Kirby Crigler, PA today - prescribed fentanyl patch; use as directed. Opdivo (nivolumab) infusion today. Return in 2 weeks for lab work, chemotherapy, and office visit.  Thank you for choosing [place] at [facility] to provide your oncology and hematology care.  To afford each patient quality time with our provider, please arrive at least 15 minutes before your scheduled appointment time.    You need to re-schedule your appointment should you arrive 10 or more minutes late.  We strive to give you quality time with our providers, and arriving late affects you and other patients whose appointments are after yours.  Also, if you no show three or more times for appointments you may be dismissed from the clinic at the providers discretion.     Again, thank you for choosing [facility].  Our hope is that these requests will decrease the amount of time that you wait before being seen by our physicians.       _____________________________________________________________  Should you have questions after your visit to [facility], please contact our office at [phone] between the hours of 8:30 a.m. and 4:30 p.m.  Voicemails left after 4:30 p.m. will not be returned until the following business day.  For prescription refill requests, have your pharmacy contact our office.

## 2015-06-06 NOTE — Progress Notes (Signed)
Gabriela Cahill, MD  [place] [address]  Squamous cell lung cancer, left - Plan: fentaNYL (DURAGESIC - DOSED MCG/HR) 25 MCG/HR patch  CURRENT THERAPY: Immunotherapy with Nivolumab 3 mg/KG every 2 weeks.   INTERVAL HISTORY: [name] 69 y.o. female returns for followup of stage IV squamous cell carcinoma of the lung. She was diagnosed with earlier stage disease back in 2014. She was treated in [place].   I personally reviewed and went over laboratory results with the patient.  The results are noted within this dictation.  Her labs meet treatment parameters.    "I was cleaning out my closet and I found a Lidocaine patch.  I put it on and it completely got rid of my pain."  I asked her if she still has the patch on and she reported that it must've fallen off when she went to lunch.  "The establishment wants a refill on my pain medication."  It looks like she is due for Fentanyl patch refill and therefore I filled that.  "Are you going to increase my dose?"  Since she has a strong history of pain medication issues, I have maintained the same dose since her pain is in her legs and not oncology related.  Her LE edema is much improved after treatment last week with PO spironolactone.  Oncologically, she is tolerating salvage therapy well.  Past Medical History  Diagnosis Date  . H/O: stroke 2012, 2000,     X3  . Anxiety   . PTSD (post-traumatic stress disorder)   . Depression   . SVD (spontaneous vaginal delivery)     x 1  . Smoker   . Osteoarthritis     knees, hips hands,   . Shortness of breath   . Pain     SEVERE PAIN BACK, RIGHT HIP AND KNEES--HARRINGTON RODS AND CERVICAL PLATES--USES WALKER OR CANE WHEN AMBULATING - GOES TO A PAIN CLINIC-STATES SHE NEEDS RT HIP AND BOTH KNEES REPLACED. PT STATES SHE WAS TOLD THE CEMENT AROUND THE HARRINGTON RODS IS CRACKED.  [name] Hot flashes, menopausal     SEVERE  . Complication of anesthesia 03/02/13    severe  headache,vertigo postop  . COPD (chronic obstructive pulmonary disease)     stable  . 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 Lung cancer   . Status post radiation therapy within four to twelve weeks 10/18/13-12/06/13    lung ca  66Gy  . Family history of anesthesia complication     equipment failure caused problem, suffered a stroke & heartattack  . Diabetes mellitus, type II   . Diabetes mellitus without complication   . Cancer   . Hypertension   . COPD (chronic obstructive pulmonary disease)   . Squamous cell lung cancer 10/02/2013    has OTHER CLOSED FRACTURE OF TARSAL&METATARSAL BONES; Postmenopausal bleeding; Smoking addiction; HTN (hypertension); Depression; Anxiety; VIN III (vulvar intraepithelial neoplasia III); COPD GOLD III; History of stroke; Essential hypertension, benign; Arthralgia of hip; Hemoptysis; Pneumonia; Chronic pain; Right lower lobe lung mass, Sq cell Ca ? 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     is allergic to carafate; prednisone; nsaids; aspirin; aspirin; ibuprofen; nsaids; and dulera.  [name] had no medications administered during this visit.  Past Surgical History  Procedure Laterality Date  . Carpal tunnel release      BILATERAL  . Nasal sinus surgery    . Lumbar fusion      L4-S1  . Cervical fusion    . Hernia repair    . Salpingoophorectomy Right   . Vaginal hysterectomy      TAH w/ ovary removal  . Transthoracic echocardiogram  12-03-2011    LVSF NORMAL/ EF 60-65%  .  Vulvectomy N/A 03/02/2013    Procedure: WIDE LOCAL EXCISION VULVAR;  Surgeon: Imagene Gurney A. Alycia Rossetti, MD;  Location: [facility];  Service: Gynecology;  Laterality: N/A;  . Co2 laser application N/A 4/40/1027    Procedure: LASER APPLICATION OF THE VULVA;  Surgeon: Janie Morning, MD;  Location: [facility];  Service: Gynecology;  Laterality: N/A;  . Video bronchoscopy Bilateral 09/23/2013    Procedure: VIDEO BRONCHOSCOPY WITHOUT FLUORO;  Surgeon: Tanda Rockers, MD;  Location: [facility];  Service: Cardiopulmonary;  Laterality: Bilateral;  . Hemiarthroplasty shoulder fracture Left 05/25/2014    DR CHANDLER  . Reverse shoulder arthroplasty Left 05/24/2014    Procedure:   Hemi-Arthroplasty  Left Shoulder;  Surgeon: Nita Sells, MD;  Location: [facility];  Service: Orthopedics;  Laterality: Left;  . Orif humerus fracture Right 12/12/2014    Procedure: OPEN REDUCTION INTERNAL FIXATION (ORIF) PROXIMAL HUMERUS FRACTURE;  Surgeon: Nita Sells, MD;  Location: [facility];  Service: Orthopedics;  Laterality: Right;    Denies any headaches, dizziness, double vision, fevers, chills, night sweats, nausea, vomiting, diarrhea, constipation, chest pain, heart palpitations, shortness of breath, blood in stool, black tarry stool, urinary pain, urinary burning, urinary frequency, hematuria.   PHYSICAL EXAMINATION  ECOG PERFORMANCE STATUS: 1 - Symptomatic but completely ambulatory  Filed Vitals:   06/06/15 1044  BP: 140/57  Pulse: 69  Temp: 98.5 F (36.9 C)  Resp: 18    GENERAL:alert, no distress, well nourished, well developed, comfortable, cooperative and smiling SKIN: skin color, texture, turgor are normal, no rashes or significant lesions HEAD: Normocephalic, No masses, lesions, tenderness or abnormalities EYES: normal, PERRLA, EOMI, Conjunctiva are pink and non-injected EARS: External ears normal OROPHARYNX:lips, buccal mucosa, and tongue normal and mucous membranes are moist  NECK:  supple, no adenopathy, thyroid normal size, non-tender, without nodularity, trachea midline LYMPH:  no palpable lymphadenopathy BREAST:not examined LUNGS: expiratory wheezing bilaterally with some rhonchi, decreased breath sounds HEART: regular rate & rhythm, no murmurs and no gallops ABDOMEN:abdomen soft and normal bowel sounds BACK: Back symmetric, no curvature. EXTREMITIES:less then 2 second capillary refill, no joint deformities, effusion, or inflammation, no skin discoloration, no cyanosis  NEURO: alert & oriented x 3 with fluent speech, no focal motor/sensory deficits, gait normal   LABORATORY DATA: CBC    Component Value Date/Time   WBC 8.8 06/06/2015 1040   WBC 5.1 12/09/2014 1100   RBC 3.87 06/06/2015 1040   RBC 4.12 12/09/2014 1100   HGB 11.4* 06/06/2015 1040   HGB 13.1 12/09/2014 1100   HCT 36.2 06/06/2015 1040   HCT 40.7 12/09/2014 1100   PLT 207 06/06/2015 1040   PLT 174 12/09/2014 1100   MCV 93.5 06/06/2015 1040   MCV 98.9 12/09/2014 1100   MCH 29.5 06/06/2015 1040   MCH 31.8 12/09/2014 1100   MCHC  31.5 06/06/2015 1040   MCHC 32.2 12/09/2014 1100   RDW 13.9 06/06/2015 1040   RDW 15.5* 12/09/2014 1100   LYMPHSABS 0.8 06/06/2015 1040   LYMPHSABS 0.9 12/09/2014 1100   MONOABS 0.5 06/06/2015 1040   MONOABS 0.3 12/09/2014 1100   EOSABS 0.1 06/06/2015 1040   EOSABS 0.1 12/09/2014 1100   BASOSABS 0.0 06/06/2015 1040   BASOSABS 0.1 12/09/2014 1100      Chemistry      Component Value Date/Time   NA 134* 06/06/2015 1040   NA 134* 12/09/2014 1100   K 4.0 06/06/2015 1040   K 4.2 12/09/2014 1100   CL 94* 06/06/2015 1040   CO2 32 06/06/2015 1040   CO2 27 12/09/2014 1100   BUN 5* 06/06/2015 1040   BUN 8.8 12/09/2014 1100   CREATININE 0.59 06/06/2015 1040   CREATININE 0.9 12/09/2014 1100      Component Value Date/Time   CALCIUM 8.4* 06/06/2015 1040   CALCIUM 8.4 12/09/2014 1100   ALKPHOS 93 06/06/2015 1040   ALKPHOS 138 12/09/2014 1100   AST 16 06/06/2015  1040   AST 15 12/09/2014 1100   ALT 7* 06/06/2015 1040   ALT 12 12/09/2014 1100   BILITOT 0.8 06/06/2015 1040   BILITOT 0.52 12/09/2014 1100        ASSESSMENT AND PLAN:  Squamous cell lung cancer Stage IV squamous cell carcinoma of the lung. On salvage Nivolumab every 2 weeks with good tolerability.  Pre-chemo labs today as ordered.  Labs meet treatment parameters and therefore, she will be treated today.  Refill on Fentanyl patches today without dose change.  She will be due for restaging scans in the future.    Her LE edema is much improved with a 1 week course of spironolactone last week.  Return in 2 weeks for follow-up and next treatment.    THERAPY PLAN:  Continue with therapy as planned in a salvage setting.  All questions were answered. The patient knows to call the clinic with any problems, questions or concerns. We can certainly see the patient much sooner if necessary.  Patient and plan discussed with Dr. Ancil Linsey and she is in agreement with the aforementioned.   This note is electronically signed by: Robynn Pane 06/06/2015 11:24 AM

## 2015-06-14 ENCOUNTER — Other Ambulatory Visit (HOSPITAL_COMMUNITY): Payer: Self-pay | Admitting: *Deleted

## 2015-06-14 DIAGNOSIS — C3492 Malignant neoplasm of unspecified part of left bronchus or lung: Secondary | ICD-10-CM

## 2015-06-14 MED ORDER — OXYCODONE-ACETAMINOPHEN 5-325 MG PO TABS: 1.0000 | ORAL_TABLET | ORAL | Status: DC | PRN

## 2015-06-14 NOTE — Pre-Procedure Instructions (Signed)
[name]  06/14/2015      [facility] - [place], [address] [address] [place] [address] Phone: [phone] Fax: [phone]  [facility], [place] - [address] [address] Phone: [phone] Fax: [phone]    Your procedure is scheduled on : June 23rd, Thursday   Report to [facility] Admitting at  11:30 A.M.  Call this number if you have problems the morning of surgery:  [phone]   Remember:  Do not eat food or drink liquids after midnight Wednesday.   Take these medicines the morning of surgery with A SIP OF WATER : Xanax, Atenolol, Nexium, Levothyroxine, Zoloft, Oxycodone.              Please use your inhalers, and the flonase the morning of.                ** Please read diabetes instruction sheet**   Do not wear jewelry, make-up or nail polish.  Do not wear lotions, powders, or perfumes.  You may NOT wear deodorant the day of surgery.  Do not shave underarms & legs 48 hours prior to surgery.     Do not bring valuables to the hospital.  [facility] is not responsible for any belongings or valuables.            Contacts, dentures or bridgework may not be worn into surgery.  Leave your suitcase in the car.  After surgery it may be brought to your room. For patients admitted to the hospital, discharge time will be determined by your treatment team.  Name and phone number of your driver:                                                                                                                                                                                                  Special instructions: "Preparing for Surgery" instruction sheet.  Please read over the following fact sheets that you were given. Pain Booklet, Coughing and Deep Breathing and Surgical Site Infection Prevention

## 2015-06-15 ENCOUNTER — Encounter (HOSPITAL_COMMUNITY)
Admission: RE | Admit: 2015-06-15 | Discharge: 2015-06-15 | Disposition: A | Discharge status: Home / Self Care | Payer: Medicare Other | Source: Ambulatory Visit | Attending: Orthopedic Surgery | Admitting: Orthopedic Surgery

## 2015-06-15 ENCOUNTER — Encounter (HOSPITAL_COMMUNITY): Payer: Self-pay

## 2015-06-15 DIAGNOSIS — F17200 Nicotine dependence, unspecified, uncomplicated: Secondary | ICD-10-CM | POA: Diagnosis not present

## 2015-06-15 DIAGNOSIS — E039 Hypothyroidism, unspecified: Secondary | ICD-10-CM | POA: Diagnosis not present

## 2015-06-15 DIAGNOSIS — J449 Chronic obstructive pulmonary disease, unspecified: Secondary | ICD-10-CM | POA: Diagnosis not present

## 2015-06-15 DIAGNOSIS — Z85118 Personal history of other malignant neoplasm of bronchus and lung: Secondary | ICD-10-CM | POA: Diagnosis not present

## 2015-06-15 DIAGNOSIS — Z79899 Other long term (current) drug therapy: Secondary | ICD-10-CM | POA: Insufficient documentation

## 2015-06-15 DIAGNOSIS — R9431 Abnormal electrocardiogram [ECG] [EKG]: Secondary | ICD-10-CM | POA: Insufficient documentation

## 2015-06-15 DIAGNOSIS — Z01812 Encounter for preprocedural laboratory examination: Secondary | ICD-10-CM | POA: Insufficient documentation

## 2015-06-15 DIAGNOSIS — Z01818 Encounter for other preprocedural examination: Secondary | ICD-10-CM | POA: Insufficient documentation

## 2015-06-15 DIAGNOSIS — Z8673 Personal history of transient ischemic attack (TIA), and cerebral infarction without residual deficits: Secondary | ICD-10-CM | POA: Diagnosis not present

## 2015-06-15 DIAGNOSIS — Z794 Long term (current) use of insulin: Secondary | ICD-10-CM | POA: Insufficient documentation

## 2015-06-15 DIAGNOSIS — E119 Type 2 diabetes mellitus without complications: Secondary | ICD-10-CM | POA: Insufficient documentation

## 2015-06-15 DIAGNOSIS — I10 Essential (primary) hypertension: Secondary | ICD-10-CM | POA: Insufficient documentation

## 2015-06-15 HISTORY — DX: Pneumonia, unspecified organism: J18.9

## 2015-06-15 HISTORY — DX: Hypothyroidism, unspecified: E03.9

## 2015-06-15 HISTORY — DX: Personal history of peptic ulcer disease: Z87.11

## 2015-06-15 HISTORY — DX: Personal history of other diseases of the digestive system: Z87.19

## 2015-06-15 LAB — CBC
HCT: 41.3 % (ref 36.0–46.0)
HEMOGLOBIN: 13 g/dL (ref 12.0–15.0)
MCH: 28.7 pg (ref 26.0–34.0)
MCHC: 31.5 g/dL (ref 30.0–36.0)
MCV: 91.2 fL (ref 78.0–100.0)
PLATELETS: 229 10*3/uL (ref 150–400)
RBC: 4.53 MIL/uL (ref 3.87–5.11)
RDW: 14.5 % (ref 11.5–15.5)
WBC: 8.1 10*3/uL (ref 4.0–10.5)

## 2015-06-15 LAB — BASIC METABOLIC PANEL
Anion gap: 10 (ref 5–15)
CHLORIDE: 93 mmol/L — AB (ref 101–111)
CO2: 32 mmol/L (ref 22–32)
Calcium: 8.9 mg/dL (ref 8.9–10.3)
Creatinine, Ser: 0.72 mg/dL (ref 0.44–1.00)
GFR calc non Af Amer: >60 mL/min (ref >60)
GLUCOSE: 107 mg/dL — AB (ref 65–99)
POTASSIUM: 4 mmol/L (ref 3.5–5.1)
Sodium: 135 mmol/L (ref 135–145)

## 2015-06-15 LAB — GLUCOSE, CAPILLARY: GLUCOSE-CAPILLARY: 104 mg/dL — AB (ref 65–99)

## 2015-06-15 NOTE — Progress Notes (Signed)
Anesthesia Chart Review:  Pt is 69 year old female scheduled for hardware removal from R shoulder on 06/22/2015 with Dr. Tamera Punt.   PMH includes: Stroke (x3, last in 2012), HTN, DM, COPD, hypothyroidism, lung cancer, PTSD. Current smoker. BMI 24. S/p ORIF R humerus fracture 12/12/14. S/p L shoulder hemi-arthroplasty 05/24/14.   Medications include: atenolol, fentanyl patch, insulin, levothyroxine, lisinopril  Preoperative labs reviewed.    Chest x-ray 02/17/2015 reviewed. Findings felt to represent radiation fibrosis on the right. Patchy bibasilar atelectasis. Infrahilar lung nodular lesion noted previously is not well seen on this study. No change in cardiac silhouette. No adenopathy appreciable.  EKG 06/15/2015: NSR. Left axis deviation. Possible Anterior infarct, age undetermined.   If no changes, I anticipate pt can proceed with surgery as scheduled.   Willeen Cass, FNP-BC [facility]/Anesthesiology Phone: [phone] 06/15/2015 4:31 PM

## 2015-06-15 NOTE — Progress Notes (Addendum)
PCP is Dr [name] Denies seeing a cardiologist Denies ever having a stress test, echo, or card cath. Denies having a recent EKG or CXR (02-12-15 CXR noted in epic) Chest CT noted in epic from 04-13-15 Reports her CBG's run in the 100's first thing in the morning. Pt lives at [facility] (assisted living)

## 2015-06-16 LAB — HEMOGLOBIN A1C
Hgb A1c MFr Bld: 7.7 % — ABNORMAL HIGH (ref 4.8–5.6)
Mean Plasma Glucose: 174 mg/dL

## 2015-06-20 ENCOUNTER — Encounter (HOSPITAL_COMMUNITY): Payer: Medicare Other | Admitting: Hematology & Oncology

## 2015-06-20 ENCOUNTER — Other Ambulatory Visit (HOSPITAL_COMMUNITY): Payer: Self-pay | Admitting: *Deleted

## 2015-06-20 ENCOUNTER — Other Ambulatory Visit (HOSPITAL_COMMUNITY): Payer: Self-pay | Admitting: Oncology

## 2015-06-20 ENCOUNTER — Encounter (HOSPITAL_BASED_OUTPATIENT_CLINIC_OR_DEPARTMENT_OTHER): Payer: Medicare Other

## 2015-06-20 VITALS — BP 114/56 | HR 73 | Temp 98.8°F | Resp 16 | Wt 156.2 lb

## 2015-06-20 DIAGNOSIS — C3492 Malignant neoplasm of unspecified part of left bronchus or lung: Secondary | ICD-10-CM

## 2015-06-20 DIAGNOSIS — Z5112 Encounter for antineoplastic immunotherapy: Secondary | ICD-10-CM

## 2015-06-20 DIAGNOSIS — C3490 Malignant neoplasm of unspecified part of unspecified bronchus or lung: Secondary | ICD-10-CM

## 2015-06-20 DIAGNOSIS — E032 Hypothyroidism due to medicaments and other exogenous substances: Secondary | ICD-10-CM

## 2015-06-20 DIAGNOSIS — C3411 Malignant neoplasm of upper lobe, right bronchus or lung: Secondary | ICD-10-CM | POA: Diagnosis not present

## 2015-06-20 LAB — CBC WITH DIFFERENTIAL/PLATELET
Basophils Absolute: 0 10*3/uL (ref 0.0–0.1)
Basophils Relative: 0 % (ref 0–1)
EOS ABS: 0 10*3/uL (ref 0.0–0.7)
Eosinophils Relative: 1 % (ref 0–5)
HEMATOCRIT: 35.7 % — AB (ref 36.0–46.0)
Hemoglobin: 10.8 g/dL — ABNORMAL LOW (ref 12.0–15.0)
LYMPHS ABS: 1 10*3/uL (ref 0.7–4.0)
Lymphocytes Relative: 14 % (ref 12–46)
MCH: 28.3 pg (ref 26.0–34.0)
MCHC: 30.3 g/dL (ref 30.0–36.0)
MCV: 93.5 fL (ref 78.0–100.0)
MONO ABS: 0.6 10*3/uL (ref 0.1–1.0)
MONOS PCT: 8 % (ref 3–12)
Neutro Abs: 5.3 10*3/uL (ref 1.7–7.7)
Neutrophils Relative %: 77 % (ref 43–77)
Platelets: 192 10*3/uL (ref 150–400)
RBC: 3.82 MIL/uL — AB (ref 3.87–5.11)
RDW: 14.6 % (ref 11.5–15.5)
WBC: 6.9 10*3/uL (ref 4.0–10.5)

## 2015-06-20 LAB — COMPREHENSIVE METABOLIC PANEL
ALT: 6 U/L — ABNORMAL LOW (ref 14–54)
ANION GAP: 7 (ref 5–15)
AST: 13 U/L — AB (ref 15–41)
Albumin: 3.2 g/dL — ABNORMAL LOW (ref 3.5–5.0)
Alkaline Phosphatase: 86 U/L (ref 38–126)
BILIRUBIN TOTAL: 0.5 mg/dL (ref 0.3–1.2)
BUN: 5 mg/dL — ABNORMAL LOW (ref 6–20)
CO2: 32 mmol/L (ref 22–32)
CREATININE: 0.69 mg/dL (ref 0.44–1.00)
Calcium: 7.9 mg/dL — ABNORMAL LOW (ref 8.9–10.3)
Chloride: 93 mmol/L — ABNORMAL LOW (ref 101–111)
GFR calc Af Amer: >60 mL/min (ref >60)
GFR calc non Af Amer: >60 mL/min (ref >60)
Glucose, Bld: 271 mg/dL — ABNORMAL HIGH (ref 65–99)
Potassium: 3.8 mmol/L (ref 3.5–5.1)
Sodium: 132 mmol/L — ABNORMAL LOW (ref 135–145)
TOTAL PROTEIN: 6.2 g/dL — AB (ref 6.5–8.1)

## 2015-06-20 LAB — TSH: TSH: 33.033 u[IU]/mL — ABNORMAL HIGH (ref 0.350–4.500)

## 2015-06-20 MED ORDER — LEVOTHYROXINE SODIUM 125 MCG PO TABS: 125.0000 ug | ORAL_TABLET | Freq: Every day | ORAL | Status: AC

## 2015-06-20 MED ORDER — LIDOCAINE-PRILOCAINE 2.5-2.5 % EX CREA: TOPICAL_CREAM | CUTANEOUS | Status: DC

## 2015-06-20 MED ORDER — HEPARIN SOD (PORK) LOCK FLUSH 100 UNIT/ML IV SOLN
500.0000 [IU] | Freq: Once | INTRAVENOUS | Status: AC | PRN
  Administered 2015-06-20: 500 [IU]

## 2015-06-20 MED ORDER — SODIUM CHLORIDE 0.9 % IV SOLN
Freq: Once | INTRAVENOUS | Status: AC
  Administered 2015-06-20: 13:00:00 via INTRAVENOUS

## 2015-06-20 MED ORDER — HEPARIN SOD (PORK) LOCK FLUSH 100 UNIT/ML IV SOLN
INTRAVENOUS | Status: AC
  Filled 2015-06-20: qty 5

## 2015-06-20 MED ORDER — NIVOLUMAB CHEMO INJECTION 100 MG/10ML
3.0000 mg/kg | Freq: Once | INTRAVENOUS | Status: AC
  Administered 2015-06-20: 220 mg via INTRAVENOUS
  Filled 2015-06-20: qty 6

## 2015-06-20 MED ORDER — SODIUM CHLORIDE 0.9 % IJ SOLN
10.0000 mL | INTRAMUSCULAR | Status: DC | PRN
  Administered 2015-06-20: 10 mL
  Filled 2015-06-20: qty 10

## 2015-06-20 NOTE — Patient Instructions (Signed)
[facility] Discharge Instructions for Patients Receiving Chemotherapy  Today you received the following chemotherapy agents Opdivo. To help prevent nausea and vomiting after your treatment, we encourage you to take your nausea medication as instructed. If you develop nausea and vomiting that is not controlled by your nausea medication, call the clinic. If it is after clinic hours your family physician or the after hours number for the clinic or go to the Emergency Department.  BELOW ARE SYMPTOMS THAT SHOULD BE REPORTED IMMEDIATELY:  *FEVER GREATER THAN 101.0 F  *CHILLS WITH OR WITHOUT FEVER  NAUSEA AND VOMITING THAT IS NOT CONTROLLED WITH YOUR NAUSEA MEDICATION  *UNUSUAL SHORTNESS OF BREATH  *UNUSUAL BRUISING OR BLEEDING  TENDERNESS IN MOUTH AND THROAT WITH OR WITHOUT PRESENCE OF ULCERS  *URINARY PROBLEMS  *BOWEL PROBLEMS  UNUSUAL RASH Items with * indicate a potential emergency and should be followed up as soon as possible.  Return as scheduled.  I have been informed and understand all the instructions given to me. I know to contact the clinic, my physician, or go to the Emergency Department if any problems should occur. I do not have any questions at this time, but understand that I may call the clinic during office hours or the Patient Navigator at [phone] should I have any questions or need assistance in obtaining follow up care.    __________________________________________  _____________  __________ Signature of Patient or Authorized Representative            Date                   Time    __________________________________________ Nurse's Signature  

## 2015-06-20 NOTE — Progress Notes (Signed)
Labs reviewed with Dr.Penland, OK to treat with Opdivo. Tolerated chemo well.

## 2015-06-21 MED ORDER — CEFAZOLIN SODIUM-DEXTROSE 2-3 GM-% IV SOLR
2.0000 g | INTRAVENOUS | Status: AC
  Administered 2015-06-22: 2 g via INTRAVENOUS
  Filled 2015-06-21: qty 50

## 2015-06-22 ENCOUNTER — Ambulatory Visit (HOSPITAL_COMMUNITY): Payer: Medicare Other | Admitting: Emergency Medicine

## 2015-06-22 ENCOUNTER — Encounter (HOSPITAL_COMMUNITY): Payer: Self-pay | Admitting: Anesthesiology

## 2015-06-22 ENCOUNTER — Encounter (HOSPITAL_COMMUNITY)
Admission: RE | Disposition: A | Discharge status: Skilled Nursing Facility | Payer: Self-pay | Source: Ambulatory Visit | Attending: Orthopedic Surgery

## 2015-06-22 ENCOUNTER — Ambulatory Visit (HOSPITAL_COMMUNITY): Payer: Medicare Other | Admitting: Anesthesiology

## 2015-06-22 ENCOUNTER — Observation Stay (HOSPITAL_COMMUNITY)
Admission: RE | Admit: 2015-06-22 | Discharge: 2015-06-23 | Disposition: A | Discharge status: Skilled Nursing Facility | Payer: Medicare Other | Source: Ambulatory Visit | Attending: Orthopedic Surgery | Admitting: Orthopedic Surgery

## 2015-06-22 DIAGNOSIS — C3492 Malignant neoplasm of unspecified part of left bronchus or lung: Secondary | ICD-10-CM

## 2015-06-22 DIAGNOSIS — E039 Hypothyroidism, unspecified: Secondary | ICD-10-CM | POA: Diagnosis not present

## 2015-06-22 DIAGNOSIS — Z9889 Other specified postprocedural states: Secondary | ICD-10-CM

## 2015-06-22 DIAGNOSIS — D649 Anemia, unspecified: Secondary | ICD-10-CM | POA: Diagnosis not present

## 2015-06-22 DIAGNOSIS — F419 Anxiety disorder, unspecified: Secondary | ICD-10-CM | POA: Diagnosis not present

## 2015-06-22 DIAGNOSIS — S42291K Other displaced fracture of upper end of right humerus, subsequent encounter for fracture with nonunion: Secondary | ICD-10-CM | POA: Insufficient documentation

## 2015-06-22 DIAGNOSIS — X58XXXD Exposure to other specified factors, subsequent encounter: Secondary | ICD-10-CM | POA: Insufficient documentation

## 2015-06-22 DIAGNOSIS — Z794 Long term (current) use of insulin: Secondary | ICD-10-CM | POA: Insufficient documentation

## 2015-06-22 DIAGNOSIS — Z888 Allergy status to other drugs, medicaments and biological substances status: Secondary | ICD-10-CM | POA: Diagnosis not present

## 2015-06-22 DIAGNOSIS — Z85118 Personal history of other malignant neoplasm of bronchus and lung: Secondary | ICD-10-CM | POA: Insufficient documentation

## 2015-06-22 DIAGNOSIS — Z886 Allergy status to analgesic agent status: Secondary | ICD-10-CM | POA: Insufficient documentation

## 2015-06-22 DIAGNOSIS — M25511 Pain in right shoulder: Secondary | ICD-10-CM | POA: Diagnosis not present

## 2015-06-22 DIAGNOSIS — J449 Chronic obstructive pulmonary disease, unspecified: Secondary | ICD-10-CM | POA: Insufficient documentation

## 2015-06-22 DIAGNOSIS — Y813 Surgical instruments, materials and general- and plastic-surgery devices (including sutures) associated with adverse incidents: Secondary | ICD-10-CM | POA: Insufficient documentation

## 2015-06-22 DIAGNOSIS — E119 Type 2 diabetes mellitus without complications: Secondary | ICD-10-CM | POA: Diagnosis not present

## 2015-06-22 DIAGNOSIS — K219 Gastro-esophageal reflux disease without esophagitis: Secondary | ICD-10-CM | POA: Insufficient documentation

## 2015-06-22 DIAGNOSIS — M159 Polyosteoarthritis, unspecified: Secondary | ICD-10-CM | POA: Diagnosis not present

## 2015-06-22 DIAGNOSIS — F17210 Nicotine dependence, cigarettes, uncomplicated: Secondary | ICD-10-CM | POA: Insufficient documentation

## 2015-06-22 DIAGNOSIS — T8484XA Pain due to internal orthopedic prosthetic devices, implants and grafts, initial encounter: Principal | ICD-10-CM | POA: Insufficient documentation

## 2015-06-22 DIAGNOSIS — F329 Major depressive disorder, single episode, unspecified: Secondary | ICD-10-CM | POA: Insufficient documentation

## 2015-06-22 DIAGNOSIS — F4310 Post-traumatic stress disorder, unspecified: Secondary | ICD-10-CM | POA: Diagnosis not present

## 2015-06-22 DIAGNOSIS — Z8673 Personal history of transient ischemic attack (TIA), and cerebral infarction without residual deficits: Secondary | ICD-10-CM | POA: Insufficient documentation

## 2015-06-22 HISTORY — PX: HARDWARE REMOVAL: SHX979

## 2015-06-22 LAB — GLUCOSE, CAPILLARY
GLUCOSE-CAPILLARY: 182 mg/dL — AB (ref 65–99)
GLUCOSE-CAPILLARY: 148 mg/dL — AB (ref 65–99)
Glucose-Capillary: 354 mg/dL — ABNORMAL HIGH (ref 65–99)

## 2015-06-22 SURGERY — REMOVAL, HARDWARE
Anesthesia: Regional | Site: Shoulder | Laterality: Right

## 2015-06-22 MED ORDER — INSULIN GLARGINE 100 UNIT/ML SOLOSTAR PEN: 21.0000 [IU] | PEN_INJECTOR | Freq: Every day | SUBCUTANEOUS | Status: DC

## 2015-06-22 MED ORDER — 0.9 % SODIUM CHLORIDE (POUR BTL) OPTIME
TOPICAL | Status: DC | PRN
  Administered 2015-06-22: 1000 mL

## 2015-06-22 MED ORDER — UMECLIDINIUM-VILANTEROL 62.5-25 MCG/INH IN AEPB: 1.0000 | INHALATION_SPRAY | Freq: Every day | RESPIRATORY_TRACT | Status: DC

## 2015-06-22 MED ORDER — MEPERIDINE HCL 25 MG/ML IJ SOLN: 6.2500 mg | INTRAMUSCULAR | Status: DC | PRN

## 2015-06-22 MED ORDER — INSULIN ASPART 100 UNIT/ML FLEXPEN: 3.0000 [IU] | PEN_INJECTOR | Freq: Three times a day (TID) | SUBCUTANEOUS | Status: DC

## 2015-06-22 MED ORDER — FENTANYL CITRATE (PF) 250 MCG/5ML IJ SOLN
INTRAMUSCULAR | Status: AC
  Filled 2015-06-22: qty 5

## 2015-06-22 MED ORDER — METOCLOPRAMIDE HCL 5 MG/ML IJ SOLN: 5.0000 mg | Freq: Three times a day (TID) | INTRAMUSCULAR | Status: DC | PRN

## 2015-06-22 MED ORDER — BUPIVACAINE-EPINEPHRINE (PF) 0.5% -1:200000 IJ SOLN
INTRAMUSCULAR | Status: DC | PRN
  Administered 2015-06-22: 20 mL via PERINEURAL

## 2015-06-22 MED ORDER — HYDROMORPHONE HCL 1 MG/ML IJ SOLN
1.0000 mg | INTRAMUSCULAR | Status: DC | PRN
  Administered 2015-06-22 (×2): 1 mg via INTRAVENOUS
  Filled 2015-06-22 (×2): qty 1

## 2015-06-22 MED ORDER — HYDROMORPHONE HCL 1 MG/ML IJ SOLN
0.2500 mg | INTRAMUSCULAR | Status: DC | PRN
  Administered 2015-06-22: 0.25 mg via INTRAVENOUS

## 2015-06-22 MED ORDER — GUAIFENESIN ER 600 MG PO TB12
1200.0000 mg | ORAL_TABLET | Freq: Two times a day (BID) | ORAL | Status: DC
  Administered 2015-06-22 – 2015-06-23 (×2): 1200 mg via ORAL
  Filled 2015-06-22 (×2): qty 2

## 2015-06-22 MED ORDER — MIDAZOLAM HCL 2 MG/2ML IJ SOLN
INTRAMUSCULAR | Status: AC
  Filled 2015-06-22: qty 2

## 2015-06-22 MED ORDER — MIDAZOLAM HCL 5 MG/5ML IJ SOLN
INTRAMUSCULAR | Status: DC | PRN
  Administered 2015-06-22: 1 mg via INTRAVENOUS

## 2015-06-22 MED ORDER — CEFAZOLIN SODIUM 1-5 GM-% IV SOLN
1.0000 g | Freq: Four times a day (QID) | INTRAVENOUS | Status: AC
  Administered 2015-06-22 – 2015-06-23 (×3): 1 g via INTRAVENOUS
  Filled 2015-06-22 (×3): qty 50

## 2015-06-22 MED ORDER — ALBUTEROL SULFATE HFA 108 (90 BASE) MCG/ACT IN AERS: 2.0000 | INHALATION_SPRAY | Freq: Four times a day (QID) | RESPIRATORY_TRACT | Status: DC | PRN

## 2015-06-22 MED ORDER — TRAZODONE HCL 100 MG PO TABS
300.0000 mg | ORAL_TABLET | Freq: Every day | ORAL | Status: DC
  Administered 2015-06-22: 300 mg via ORAL
  Filled 2015-06-22: qty 3

## 2015-06-22 MED ORDER — INSULIN ASPART 100 UNIT/ML ~~LOC~~ SOLN
9.0000 [IU] | SUBCUTANEOUS | Status: DC
  Administered 2015-06-23: 9 [IU] via SUBCUTANEOUS

## 2015-06-22 MED ORDER — OXYCODONE HCL 5 MG PO TABS
5.0000 mg | ORAL_TABLET | ORAL | Status: DC | PRN
  Administered 2015-06-22 – 2015-06-23 (×4): 10 mg via ORAL
  Filled 2015-06-22: qty 1
  Filled 2015-06-22: qty 2
  Filled 2015-06-22: qty 1
  Filled 2015-06-22 (×2): qty 2

## 2015-06-22 MED ORDER — MIDAZOLAM HCL 2 MG/2ML IJ SOLN: 2.0000 mg | Freq: Once | INTRAMUSCULAR | Status: DC

## 2015-06-22 MED ORDER — LACTATED RINGERS IV SOLN
INTRAVENOUS | Status: DC | PRN
  Administered 2015-06-22 (×2): via INTRAVENOUS

## 2015-06-22 MED ORDER — LEVOTHYROXINE SODIUM 125 MCG PO TABS
125.0000 ug | ORAL_TABLET | Freq: Every day | ORAL | Status: DC
  Administered 2015-06-23: 125 ug via ORAL
  Filled 2015-06-22: qty 1

## 2015-06-22 MED ORDER — ONDANSETRON HCL 4 MG PO TABS: 4.0000 mg | ORAL_TABLET | Freq: Four times a day (QID) | ORAL | Status: DC | PRN

## 2015-06-22 MED ORDER — SERTRALINE HCL 100 MG PO TABS
200.0000 mg | ORAL_TABLET | Freq: Every day | ORAL | Status: DC
  Administered 2015-06-23: 200 mg via ORAL
  Filled 2015-06-22: qty 2

## 2015-06-22 MED ORDER — ALBUTEROL SULFATE (2.5 MG/3ML) 0.083% IN NEBU: 2.5000 mg | INHALATION_SOLUTION | Freq: Four times a day (QID) | RESPIRATORY_TRACT | Status: DC | PRN

## 2015-06-22 MED ORDER — PANTOPRAZOLE SODIUM 40 MG PO TBEC
80.0000 mg | DELAYED_RELEASE_TABLET | Freq: Every day | ORAL | Status: DC
  Administered 2015-06-23: 80 mg via ORAL
  Filled 2015-06-22: qty 2

## 2015-06-22 MED ORDER — INSULIN GLARGINE 100 UNIT/ML ~~LOC~~ SOLN
21.0000 [IU] | Freq: Every day | SUBCUTANEOUS | Status: DC
  Administered 2015-06-22: 21 [IU] via SUBCUTANEOUS
  Filled 2015-06-22 (×2): qty 0.21

## 2015-06-22 MED ORDER — PROPOFOL 10 MG/ML IV BOLUS
INTRAVENOUS | Status: DC | PRN
Start: 2015-06-22 — End: 2015-06-22
  Administered 2015-06-22: 120 mg via INTRAVENOUS

## 2015-06-22 MED ORDER — ROCURONIUM BROMIDE 100 MG/10ML IV SOLN
INTRAVENOUS | Status: DC | PRN
  Administered 2015-06-22: 30 mg via INTRAVENOUS

## 2015-06-22 MED ORDER — FLUTICASONE PROPIONATE 50 MCG/ACT NA SUSP
1.0000 | Freq: Every day | NASAL | Status: DC | PRN
  Filled 2015-06-22: qty 16

## 2015-06-22 MED ORDER — LISINOPRIL 5 MG PO TABS
5.0000 mg | ORAL_TABLET | Freq: Every day | ORAL | Status: DC
  Administered 2015-06-23: 5 mg via ORAL
  Filled 2015-06-22: qty 1

## 2015-06-22 MED ORDER — LACTATED RINGERS IV SOLN
INTRAVENOUS | Status: DC
  Administered 2015-06-22: 10 mL/h via INTRAVENOUS

## 2015-06-22 MED ORDER — ONDANSETRON HCL 4 MG/2ML IJ SOLN: 4.0000 mg | Freq: Four times a day (QID) | INTRAMUSCULAR | Status: DC | PRN

## 2015-06-22 MED ORDER — METOCLOPRAMIDE HCL 5 MG PO TABS: 5.0000 mg | ORAL_TABLET | Freq: Three times a day (TID) | ORAL | Status: DC | PRN

## 2015-06-22 MED ORDER — MIDAZOLAM HCL 2 MG/2ML IJ SOLN
INTRAMUSCULAR | Status: AC
  Administered 2015-06-22: 2 mg
  Filled 2015-06-22: qty 2

## 2015-06-22 MED ORDER — FENTANYL CITRATE (PF) 100 MCG/2ML IJ SOLN: 100.0000 ug | Freq: Once | INTRAMUSCULAR | Status: DC

## 2015-06-22 MED ORDER — ONDANSETRON HCL 4 MG/2ML IJ SOLN
INTRAMUSCULAR | Status: DC | PRN
Start: 2015-06-22 — End: 2015-06-22
  Administered 2015-06-22: 4 mg via INTRAVENOUS

## 2015-06-22 MED ORDER — SODIUM CHLORIDE 0.9 % IV SOLN: INTRAVENOUS | Status: DC

## 2015-06-22 MED ORDER — NEOSTIGMINE METHYLSULFATE 10 MG/10ML IV SOLN
INTRAVENOUS | Status: DC | PRN
  Administered 2015-06-22: 3 mg via INTRAVENOUS

## 2015-06-22 MED ORDER — ATENOLOL 50 MG PO TABS
50.0000 mg | ORAL_TABLET | Freq: Every day | ORAL | Status: DC
  Administered 2015-06-23: 50 mg via ORAL
  Filled 2015-06-22: qty 1

## 2015-06-22 MED ORDER — INSULIN ASPART 100 UNIT/ML ~~LOC~~ SOLN
3.0000 [IU] | Freq: Every day | SUBCUTANEOUS | Status: DC
Start: 2015-06-23 — End: 2015-06-23
  Administered 2015-06-23: 3 [IU] via SUBCUTANEOUS

## 2015-06-22 MED ORDER — CHLORZOXAZONE 500 MG PO TABS
500.0000 mg | ORAL_TABLET | Freq: Four times a day (QID) | ORAL | Status: DC
  Administered 2015-06-22 – 2015-06-23 (×3): 500 mg via ORAL
  Filled 2015-06-22 (×7): qty 1

## 2015-06-22 MED ORDER — FENTANYL CITRATE (PF) 100 MCG/2ML IJ SOLN
INTRAMUSCULAR | Status: AC
  Administered 2015-06-22: 50 ug
  Filled 2015-06-22: qty 2

## 2015-06-22 MED ORDER — PHENYLEPHRINE HCL 10 MG/ML IJ SOLN
10.0000 mg | INTRAVENOUS | Status: DC | PRN
  Administered 2015-06-22: 25 ug/min via INTRAVENOUS

## 2015-06-22 MED ORDER — ALBUTEROL SULFATE HFA 108 (90 BASE) MCG/ACT IN AERS
INHALATION_SPRAY | RESPIRATORY_TRACT | Status: DC | PRN
  Administered 2015-06-22: 4 via RESPIRATORY_TRACT

## 2015-06-22 MED ORDER — DOCUSATE SODIUM 100 MG PO CAPS
100.0000 mg | ORAL_CAPSULE | Freq: Two times a day (BID) | ORAL | Status: DC
  Administered 2015-06-22 – 2015-06-23 (×2): 100 mg via ORAL
  Filled 2015-06-22 (×2): qty 1

## 2015-06-22 MED ORDER — ALPRAZOLAM 0.5 MG PO TABS
2.0000 mg | ORAL_TABLET | Freq: Two times a day (BID) | ORAL | Status: DC
  Administered 2015-06-22 – 2015-06-23 (×2): 2 mg via ORAL
  Filled 2015-06-22 (×2): qty 4

## 2015-06-22 MED ORDER — PROPOFOL 10 MG/ML IV BOLUS
INTRAVENOUS | Status: AC
  Filled 2015-06-22: qty 20

## 2015-06-22 MED ORDER — ACETAMINOPHEN 650 MG RE SUPP: 650.0000 mg | Freq: Four times a day (QID) | RECTAL | Status: DC | PRN

## 2015-06-22 MED ORDER — ALUMINUM HYDROXIDE GEL 320 MG/5ML PO SUSP
15.0000 mL | ORAL | Status: DC | PRN
  Filled 2015-06-22: qty 30

## 2015-06-22 MED ORDER — PHENOL 1.4 % MT LIQD: 1.0000 | OROMUCOSAL | Status: DC | PRN

## 2015-06-22 MED ORDER — ACETAMINOPHEN 325 MG PO TABS: 650.0000 mg | ORAL_TABLET | Freq: Four times a day (QID) | ORAL | Status: DC | PRN

## 2015-06-22 MED ORDER — PROMETHAZINE HCL 25 MG/ML IJ SOLN: 6.2500 mg | INTRAMUSCULAR | Status: DC | PRN

## 2015-06-22 MED ORDER — FENTANYL CITRATE (PF) 100 MCG/2ML IJ SOLN
INTRAMUSCULAR | Status: DC | PRN
  Administered 2015-06-22: 5 ug via INTRAVENOUS
  Administered 2015-06-22: 50 ug via INTRAVENOUS

## 2015-06-22 MED ORDER — HYDROMORPHONE HCL 1 MG/ML IJ SOLN
INTRAMUSCULAR | Status: AC
  Administered 2015-06-22: 0.25 mg
  Filled 2015-06-22: qty 1

## 2015-06-22 MED ORDER — ACETAMINOPHEN 500 MG PO TABS
1000.0000 mg | ORAL_TABLET | Freq: Four times a day (QID) | ORAL | Status: AC
  Administered 2015-06-22 – 2015-06-23 (×4): 1000 mg via ORAL
  Filled 2015-06-22 (×4): qty 2

## 2015-06-22 MED ORDER — GLYCOPYRROLATE 0.2 MG/ML IJ SOLN
INTRAMUSCULAR | Status: DC | PRN
  Administered 2015-06-22: 0.2 mg via INTRAVENOUS
  Administered 2015-06-22: .2 mg via INTRAVENOUS
  Administered 2015-06-22: 0.4 mg via INTRAVENOUS

## 2015-06-22 MED ORDER — NALOXEGOL OXALATE 25 MG PO TABS
25.0000 mg | ORAL_TABLET | Freq: Every day | ORAL | Status: DC
  Administered 2015-06-22: 25 mg via ORAL
  Filled 2015-06-22 (×3): qty 1

## 2015-06-22 MED ORDER — MENTHOL 3 MG MT LOZG: 1.0000 | LOZENGE | OROMUCOSAL | Status: DC | PRN

## 2015-06-22 MED ORDER — LIDOCAINE HCL (CARDIAC) 20 MG/ML IV SOLN
INTRAVENOUS | Status: DC | PRN
  Administered 2015-06-22 (×2): 50 mg via INTRAVENOUS

## 2015-06-22 MED ORDER — POVIDONE-IODINE 7.5 % EX SOLN
Freq: Once | CUTANEOUS | Status: DC
  Filled 2015-06-22: qty 118

## 2015-06-22 MED ORDER — DIPHENHYDRAMINE HCL 12.5 MG/5ML PO ELIX: 12.5000 mg | ORAL_SOLUTION | ORAL | Status: DC | PRN

## 2015-06-22 SURGICAL SUPPLY — 61 items
ANCH SUT SWLK 19.1X4.75 VT (Anchor) ×1 IMPLANT
ANCHOR PEEK 4.75X19.1 SWLK C (Anchor) ×2 IMPLANT
BANDAGE ELASTIC 6 VELCRO ST LF (GAUZE/BANDAGES/DRESSINGS) IMPLANT
BANDAGE ESMARK 6X9 LF (GAUZE/BANDAGES/DRESSINGS) ×1 IMPLANT
BLADE SURG 10 STRL SS (BLADE) ×1 IMPLANT
BLADE SURG ROTATE 9660 (MISCELLANEOUS) IMPLANT
BNDG CMPR 9X6 STRL LF SNTH (GAUZE/BANDAGES/DRESSINGS)
BNDG COHESIVE 6X5 TAN STRL LF (GAUZE/BANDAGES/DRESSINGS) ×2 IMPLANT
BNDG ESMARK 6X9 LF (GAUZE/BANDAGES/DRESSINGS)
CHLORAPREP W/TINT 26ML (MISCELLANEOUS) ×3 IMPLANT
CLOSURE WOUND 1/2 X4 (GAUZE/BANDAGES/DRESSINGS) ×1
COVER MAYO STAND STRL (DRAPES) ×1 IMPLANT
COVER SURGICAL LIGHT HANDLE (MISCELLANEOUS) ×3 IMPLANT
CUFF TOURNIQUET SINGLE 34IN LL (TOURNIQUET CUFF) IMPLANT
CUFF TOURNIQUET SINGLE 44IN (TOURNIQUET CUFF) IMPLANT
DRAPE INCISE IOBAN 66X45 STRL (DRAPES) ×4 IMPLANT
DRAPE OEC MINIVIEW 54X84 (DRAPES) IMPLANT
DRAPE SURG 17X23 STRL (DRAPES) ×2 IMPLANT
DRAPE U-SHAPE 47X51 STRL (DRAPES) ×3 IMPLANT
DRSG AQUACEL AG ADV 3.5X10 (GAUZE/BANDAGES/DRESSINGS) ×2 IMPLANT
ELECT REM PT RETURN 9FT ADLT (ELECTROSURGICAL) ×3
ELECTRODE REM PT RTRN 9FT ADLT (ELECTROSURGICAL) ×1 IMPLANT
GAUZE SPONGE 4X4 12PLY STRL (GAUZE/BANDAGES/DRESSINGS) IMPLANT
GAUZE XEROFORM 1X8 LF (GAUZE/BANDAGES/DRESSINGS) IMPLANT
GLOVE BIO SURGEON STRL SZ7 (GLOVE) ×3 IMPLANT
GLOVE BIO SURGEON STRL SZ7.5 (GLOVE) ×3 IMPLANT
GLOVE BIOGEL PI IND STRL 8 (GLOVE) ×1 IMPLANT
GLOVE BIOGEL PI INDICATOR 8 (GLOVE) ×2
GOWN STRL REUS W/ TWL LRG LVL3 (GOWN DISPOSABLE) ×3 IMPLANT
GOWN STRL REUS W/ TWL XL LVL3 (GOWN DISPOSABLE) ×1 IMPLANT
GOWN STRL REUS W/TWL LRG LVL3 (GOWN DISPOSABLE) ×9
GOWN STRL REUS W/TWL XL LVL3 (GOWN DISPOSABLE) ×3
KIT BASIN OR (CUSTOM PROCEDURE TRAY) ×3 IMPLANT
KIT ROOM TURNOVER OR (KITS) ×3 IMPLANT
MANIFOLD NEPTUNE II (INSTRUMENTS) ×3 IMPLANT
NS IRRIG 1000ML POUR BTL (IV SOLUTION) ×3 IMPLANT
PACK ORTHO EXTREMITY (CUSTOM PROCEDURE TRAY) ×3 IMPLANT
PAD ARMBOARD 7.5X6 YLW CONV (MISCELLANEOUS) ×6 IMPLANT
PAD CAST 4YDX4 CTTN HI CHSV (CAST SUPPLIES) IMPLANT
PADDING CAST COTTON 4X4 STRL (CAST SUPPLIES)
RETRIEVER SUT HEWSON (MISCELLANEOUS) ×2 IMPLANT
SPONGE LAP 4X18 X RAY DECT (DISPOSABLE) ×2 IMPLANT
STAPLER VISISTAT 35W (STAPLE) IMPLANT
STOCKINETTE IMPERVIOUS 9X36 MD (GAUZE/BANDAGES/DRESSINGS) ×2 IMPLANT
STRIP CLOSURE SKIN 1/2X4 (GAUZE/BANDAGES/DRESSINGS) ×1 IMPLANT
SUCTION FRAZIER TIP 10 FR DISP (SUCTIONS) ×3 IMPLANT
SUPPORT WRAP ARM LG (MISCELLANEOUS) ×2 IMPLANT
SUT ETHILON 4 0 PS 2 18 (SUTURE) IMPLANT
SUT MNCRL AB 4-0 PS2 18 (SUTURE) ×2 IMPLANT
SUT VIC AB 0 CTB1 27 (SUTURE) IMPLANT
SUT VIC AB 2-0 CT1 27 (SUTURE) ×3
SUT VIC AB 2-0 CT1 TAPERPNT 27 (SUTURE) IMPLANT
SUT VIC AB 2-0 FS1 27 (SUTURE) ×2 IMPLANT
SYR CONTROL 10ML LL (SYRINGE) IMPLANT
TAPE FIBER 2MM 7IN #2 BLUE (SUTURE) ×4 IMPLANT
TOWEL OR 17X24 6PK STRL BLUE (TOWEL DISPOSABLE) ×3 IMPLANT
TOWEL OR 17X26 10 PK STRL BLUE (TOWEL DISPOSABLE) ×3 IMPLANT
TUBE CONNECTING 12'X1/4 (SUCTIONS) ×1
TUBE CONNECTING 12X1/4 (SUCTIONS) ×2 IMPLANT
WATER STERILE IRR 1000ML POUR (IV SOLUTION) ×1 IMPLANT
YANKAUER SUCT BULB TIP NO VENT (SUCTIONS) ×2 IMPLANT

## 2015-06-22 NOTE — Anesthesia Procedure Notes (Addendum)
Anesthesia Regional Block:  Interscalene brachial plexus block  Pre-Anesthetic Checklist: ,, timeout performed, Correct Patient, Correct Site, Correct Laterality, Correct Procedure, Correct Position, site marked, Risks and benefits discussed, Surgical consent,  Pre-op evaluation,  Post-op pain management  Laterality: Right  Prep: chloraprep       Needles:  Injection technique: Single-shot  Needle Type: Stimulator Needle - 40     Needle Length: 4cm 4 cm Needle Gauge: 22 and 22 G    Additional Needles:  Procedures: ultrasound guided (picture in chart) Interscalene brachial plexus block Narrative:  Injection made incrementally with aspirations every 5 mL.  Performed by: Personally  Anesthesiologist: Nolon Nations  Additional Notes: BP cuff, EKG monitors applied. Sedation begun. Nerve location verified with U/S. Anesthetic injected incrementally, slowly , and after neg aspirations under direct u/s guidance. Good perineural spread. Tolerated well.   Procedure Name: Intubation Date/Time: 06/22/2015 2:08 PM Performed by: Eligha Bridegroom Pre-anesthesia Checklist: Emergency Drugs available, Patient identified, Timeout performed, Suction available and Patient being monitored Patient Re-evaluated:Patient Re-evaluated prior to inductionOxygen Delivery Method: Circle system utilized Preoxygenation: Pre-oxygenation with 100% oxygen Intubation Type: IV induction Ventilation: Mask ventilation without difficulty and Oral airway inserted - appropriate to patient size Laryngoscope Size: Mac and 3 Grade View: Grade I Tube type: Oral Tube size: 7.0 mm Number of attempts: 1 Airway Equipment and Method: Stylet and LTA kit utilized Secured at: 21 cm Tube secured with: Tape Dental Injury: Teeth and Oropharynx as per pre-operative assessment

## 2015-06-22 NOTE — H&P (Signed)
[name] is an 69 y.o. female.   Chief Complaint: R shoulder pain HPI: s/p ORIF R proximal humerus fracture with failure of fixation and prominent hardware.   Past Medical History  Diagnosis Date  . H/O: stroke 2012, 2000,     X3  . Anxiety   . PTSD (post-traumatic stress disorder)   . Depression   . SVD (spontaneous vaginal delivery)     x 1  . Smoker   . Osteoarthritis     knees, hips hands,   . Shortness of breath   . Pain     SEVERE PAIN BACK, RIGHT HIP AND KNEES--HARRINGTON RODS AND CERVICAL PLATES--USES WALKER OR CANE WHEN AMBULATING - GOES TO A PAIN CLINIC-STATES SHE NEEDS RT HIP AND BOTH KNEES REPLACED. PT STATES SHE WAS TOLD THE CEMENT AROUND THE HARRINGTON RODS IS CRACKED.  [name] Hot flashes, menopausal     SEVERE  . Complication of anesthesia 03/02/13    severe headache,vertigo postop  . COPD (chronic obstructive pulmonary disease)     stable  . 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 Lung cancer   . Status post radiation therapy within four to twelve weeks 10/18/13-12/06/13    lung ca  66Gy  . Family history of anesthesia complication     equipment failure caused problem, suffered a stroke & heartattack  . Diabetes mellitus, type II   . Diabetes mellitus without complication   . Cancer   . Hypertension   . COPD (chronic obstructive pulmonary disease)   . Squamous cell lung cancer 10/02/2013  . Pneumonia   . Hypothyroidism   . History of stomach ulcers   . History of hiatal hernia   . PONV (postoperative nausea and vomiting)     Past Surgical History  Procedure Laterality Date  . Carpal tunnel release      BILATERAL  . Nasal sinus surgery    . Lumbar fusion      L4-S1  . Cervical fusion    . Hernia repair    . Salpingoophorectomy Right   . Vaginal hysterectomy      TAH w/ ovary removal  . Transthoracic echocardiogram  12-03-2011    LVSF NORMAL/ EF 60-65%  . Vulvectomy N/A 03/02/2013    Procedure: WIDE LOCAL EXCISION VULVAR;  Surgeon: Imagene Gurney A. Alycia Rossetti,  MD;  Location: [facility];  Service: Gynecology;  Laterality: N/A;  . Co2 laser application N/A 06/14/736    Procedure: LASER APPLICATION OF THE VULVA;  Surgeon: Janie Morning, MD;  Location: [facility];  Service: Gynecology;  Laterality: N/A;  . Video bronchoscopy Bilateral 09/23/2013    Procedure: VIDEO BRONCHOSCOPY WITHOUT FLUORO;  Surgeon: Tanda Rockers, MD;  Location: [facility];  Service: Cardiopulmonary;  Laterality: Bilateral;  . Hemiarthroplasty shoulder fracture Left 05/25/2014    DR Calab Sachse  . Reverse shoulder arthroplasty Left 05/24/2014    Procedure:   Hemi-Arthroplasty  Left Shoulder;  Surgeon: Nita Sells, MD;  Location: [place];  Service: Orthopedics;  Laterality: Left;  . Orif humerus fracture Right 12/12/2014    Procedure: OPEN REDUCTION INTERNAL FIXATION (ORIF) PROXIMAL HUMERUS FRACTURE;  Surgeon: Nita Sells, MD;  Location: [place];  Service: Orthopedics;  Laterality: Right;  . Tonsillectomy      Family History  Problem Relation Age of Onset  . Cancer Mother     COLON  . Depression Mother   . Heart disease Father   . Diabetes Maternal Aunt   . Cancer Maternal  Grandfather     KIDNEY   . Diabetes Maternal Grandfather   . Hypertension Maternal Grandfather   . Heart disease Maternal Grandfather   . Drug abuse Sister    Social History:  reports that she has been smoking Cigarettes.  She has been smoking about 0.25 packs per day. She has never used smokeless tobacco. She reports that she does not drink alcohol or use illicit drugs.  Allergies:  Allergies  Allergen Reactions  . Carafate [Sucralfate] Nausea Only  . Prednisone Other (See Comments)    insomnia  . Nsaids     GI Upset  . Aspirin Other (See Comments)    Gi symptoms. Does NOT take ibuprofen or other NSAIDS  . Aspirin Nausea And Vomiting  . Ibuprofen     Gi upset  . Nsaids Nausea And Vomiting  . Dulera [Mometasone Furo-Formoterol Fum] Palpitations    Panting " my  body was twisted inside and out"    Medications Prior to Admission  Medication Sig Dispense Refill  . acetaminophen (TYLENOL) 325 MG tablet Take 650 mg by mouth every 6 (six) hours as needed (pain).     [name] ALPRAZolam (XANAX) 1 MG tablet Take 1 mg by mouth daily.    [name] alprazolam (XANAX) 2 MG tablet Take 1 tablet (2 mg total) by mouth 3 (three) times daily. (Patient taking differently: Take 2 mg by mouth 2 (two) times daily. ) 90 tablet 2  . ANORO ELLIPTA 62.5-25 MCG/INH AEPB Inhale 1 puff into the lungs daily.    [name] atenolol (TENORMIN) 50 MG tablet Take 1 tablet (50 mg total) by mouth daily. 30 tablet 0  . chlorzoxazone (PARAFON) 500 MG tablet Take 1 tablet (500 mg total) by mouth 4 (four) times daily. 120 tablet 0  . esomeprazole (NEXIUM) 40 MG capsule Take 1 capsule (40 mg total) by mouth daily. 30 capsule 0  . fluticasone (FLONASE) 50 MCG/ACT nasal spray Place 1 spray into both nostrils daily as needed for allergies or rhinitis. 16 g 0  . guaiFENesin (MUCINEX) 600 MG 12 hr tablet Take 1,200 mg by mouth 2 (two) times daily.    . insulin aspart (NOVOLOG FLEXPEN) 100 UNIT/ML FlexPen Inject 5 Units into the skin 3 (three) times daily with meals. (Patient taking differently: Inject 3-9 Units into the skin 3 (three) times daily with meals. 3 units every morning before breakfast and 9 units before lunch and dinner) 15 mL 11  . Insulin Glargine (LANTUS SOLOSTAR) 100 UNIT/ML Solostar Pen Inject 20 Units into the skin daily at 10 pm. (Patient taking differently: Inject 21 Units into the skin daily at 10 pm. ) 15 mL 11  . levothyroxine (SYNTHROID) 125 MCG tablet Take 1 tablet (125 mcg total) by mouth daily before breakfast. 30 tablet 1  . lisinopril (PRINIVIL,ZESTRIL) 5 MG tablet Take 1 tablet (5 mg total) by mouth daily. 30 tablet 0  . ondansetron (ZOFRAN) 8 MG tablet Take 1 tablet (8 mg total) by mouth every 8 (eight) hours as needed for nausea or vomiting. 30 tablet 2  . oxyCODONE-acetaminophen  (PERCOCET/ROXICET) 5-325 MG per tablet Take 1 tablet by mouth every 4 (four) hours as needed for moderate pain or severe pain. 40 tablet 0  . sertraline (ZOLOFT) 100 MG tablet Take 2 tablets (200 mg total) by mouth daily. 60 tablet 2  . traZODone (DESYREL) 150 MG tablet Take 2 tablets (300 mg total) by mouth at bedtime. 60 tablet 2  . albuterol (PROVENTIL HFA;VENTOLIN HFA) 108 (90  BASE) MCG/ACT inhaler 2 puffs every 4 - 6 hours as needed for dyspnea (Patient not taking: Reported on 06/15/2015) 1 Inhaler 2  . albuterol (PROVENTIL) (2.5 MG/3ML) 0.083% nebulizer solution Take 2.5 mg by nebulization every 6 (six) hours as needed for wheezing or shortness of breath.    [name] albuterol (VENTOLIN HFA) 108 (90 BASE) MCG/ACT inhaler Inhale 2 puffs into the lungs every 6 (six) hours as needed for wheezing or shortness of breath. 1 Inhaler 0  . fentaNYL (DURAGESIC - DOSED MCG/HR) 25 MCG/HR patch Place 1 patch (25 mcg total) onto the skin every 3 (three) days. (Patient not taking: Reported on 06/15/2015) 10 patch 0  . Insulin Pen Needle (NOVOFINE) 30G X 8 MM MISC Inject 1 packet into the skin as needed.    . lidocaine-prilocaine (EMLA) cream Apply a quarter size amount to port site 1 hour prior to chemo. Do not rub in. Cover with plastic wrap. 30 g 3  . MOVANTIK 25 MG TABS tablet Take 25 mg by mouth daily.    [name] spironolactone (ALDACTONE) 50 MG tablet Take 2 tablets (100 mg total) by mouth daily. (Patient not taking: Reported on 05/31/2015) 10 tablet 0  . tiotropium (SPIRIVA) 18 MCG inhalation capsule Place 1 capsule (18 mcg total) into inhaler and inhale daily. (Patient not taking: Reported on 05/23/2015) 30 capsule 12    Results for orders placed or performed during the hospital encounter of 06/22/15 (from the past 48 hour(s))  Glucose, capillary     Status: Abnormal   Collection Time: 06/22/15 11:23 AM  Result Value Ref Range   Glucose-Capillary 182 (H) 65 - 99 mg/dL   No results found.  Review of Systems   Respiratory: Positive for cough and shortness of breath.     Blood pressure 103/59, pulse 59, temperature 97.9 F (36.6 C), temperature source Oral, resp. rate 18, height '5\' 6"'$  (1.676 m), weight 70.852 kg (156 lb 3.2 oz), SpO2 97 %. Physical Exam  Constitutional: She is oriented to person, place, and time.  HENT:  Head: Normocephalic and atraumatic.  Cardiovascular: Intact distal pulses.   Respiratory: Effort normal.  Musculoskeletal:  R shoulder with mild swelling, no warmth or erythema  Neurological: She is alert and oriented to person, place, and time.  Skin: Skin is warm and dry.  Psychiatric: She has a normal mood and affect.     Assessment/Plan Plan hardware removal R shoulder to try to decrease symptoms and minimize risk of prominent hardware Risks / benefits of surgery discussed Consent on chart  NPO for OR Preop antibiotics   Jinna Weinman WILLIAM 06/22/2015, 1:16 PM

## 2015-06-22 NOTE — Anesthesia Postprocedure Evaluation (Signed)
Anesthesia Post Note  Patient: [name]  Procedure(s) Performed: Procedure(s) (LRB): HARDWARE REMOVAL GLENOHUMERAL SUSPENSION (Right)  Anesthesia type: general  Patient location: PACU  Post pain: Pain level controlled  Post assessment: Patient's Cardiovascular Status Stable  Last Vitals:  Filed Vitals:   06/22/15 1700  BP: 126/55  Pulse: 64  Temp: 36.1 C  Resp: 16    Post vital signs: Reviewed and stable  Level of consciousness: sedated  Complications: No apparent anesthesia complications

## 2015-06-22 NOTE — Progress Notes (Signed)
Upon arrival pt's pulse ox was 82-84% on room air, placed on 2L via nasal cannula which was reduced to 1L after pulse ox improved to 97%. Pt noted to be drowsy at times but easily arousable and oriented. Pt reports hx of COPD, lung cancer and wearing oxygen as needed but was 2 years ago. No acute respiratory distress noted, will continuously monitor pulse ox.

## 2015-06-22 NOTE — Progress Notes (Signed)
Richard, CRNA at bedside.

## 2015-06-22 NOTE — Transfer of Care (Signed)
Immediate Anesthesia Transfer of Care Note  Patient: [name]  Procedure(s) Performed: Procedure(s) with comments: HARDWARE REMOVAL GLENOHUMERAL SUSPENSION (Right) - Deep hardware removal of right shoulder  Patient Location: PACU  Anesthesia Type:General  Level of Consciousness: awake and alert   Airway & Oxygen Therapy: Patient Spontanous Breathing and Patient connected to nasal cannula oxygen  Post-op Assessment: Report given to RN and Post -op Vital signs reviewed and stable  Post vital signs: Reviewed and stable  Last Vitals:  Filed Vitals:   06/22/15 1345  BP: 156/59  Pulse: 66  Temp:   Resp: 16    Complications: No apparent anesthesia complications

## 2015-06-22 NOTE — Anesthesia Preprocedure Evaluation (Signed)
Anesthesia Evaluation   Patient awake    Reviewed: Allergy & Precautions, H&P , NPO status , Patient's Chart, lab work & pertinent test results  History of Anesthesia Complications (+) PONV, Family history of anesthesia reaction and history of anesthetic complications  Airway Mallampati: II  TM Distance: >3 FB     Dental  (+) Teeth Intact   Pulmonary shortness of breath, pneumonia -, COPDCurrent Smoker,  breath sounds clear to auscultation        Cardiovascular hypertension, Pt. on medications Rhythm:Regular     Neuro/Psych    GI/Hepatic hiatal hernia, GERD-  Medicated,  Endo/Other  diabetes, Type 2Hypothyroidism   Renal/GU      Musculoskeletal  (+) Arthritis -,   Abdominal   Peds  Hematology  (+) anemia ,   Anesthesia Other Findings   Reproductive/Obstetrics                             Anesthesia Physical  Anesthesia Plan  ASA: III  Anesthesia Plan: General and Regional   Post-op Pain Management:    Induction: Intravenous  Airway Management Planned: Oral ETT  Additional Equipment:   Intra-op Plan:   Post-operative Plan: Extubation in OR  Informed Consent: I have reviewed the patients History and Physical, chart, labs and discussed the procedure including the risks, benefits and alternatives for the proposed anesthesia with the patient or authorized representative who has indicated his/her understanding and acceptance.   Dental advisory given  Plan Discussed with: CRNA  Anesthesia Plan Comments:         Anesthesia Quick Evaluation                                  Anesthesia Evaluation  Patient identified by MRN, date of birth, ID band  Reviewed: Allergy & Precautions, H&P , NPO status , Patient's Chart, lab work & pertinent test results, reviewed documented beta blocker date and time   Airway        Dental   Pulmonary COPD oxygen dependent, Current  Smoker,  Lung CA, SP radiation RX         Cardiovascular hypertension, Pt. on medications     Neuro/Psych Anxiety Depression    GI/Hepatic   Endo/Other    Renal/GU      Musculoskeletal   Abdominal   Peds  Hematology   Anesthesia Other Findings   Reproductive/Obstetrics                             Anesthesia Physical Anesthesia Plan  ASA: III  Anesthesia Plan: General   Post-op Pain Management: MAC Combined w/ Regional for Post-op pain   Induction: Intravenous  Airway Management Planned: Oral ETT  Additional Equipment:   Intra-op Plan:   Post-operative Plan: Extubation in OR  Informed Consent: I have reviewed the patients History and Physical, chart, labs and discussed the procedure including the risks, benefits and alternatives for the proposed anesthesia with the patient or authorized representative who has indicated his/her understanding and acceptance.     Plan Discussed with:   Anesthesia Plan Comments:         Anesthesia Quick Evaluation

## 2015-06-22 NOTE — Op Note (Signed)
Procedure(s): HARDWARE REMOVAL GLENOHUMERAL SUSPENSION Procedure Note  [name] female 69 y.o. 06/22/2015  Procedure(s) and Anesthesia Type:    * RIGHT SHOULDER HARDWARE REMOVAL GLENOHUMERAL SUSPENSION - Choice  Surgeon(s) and Role:    * Tania Ade, MD - Primary   Indications:  69 y.o. female      Surgeon: Nita Sells   Assistants: Jeanmarie Hubert PA-C (Danielle was present and scrubbed throughout the procedure and was essential in positioning, retraction, exposure, and closure)  Anesthesia: General endotracheal anesthesia with preoperative interscalene block given by the attending anesthesiologist    Procedure Detail  HARDWARE REMOVAL GLENOHUMERAL SUSPENSION  Findings: All hardware was removed. The nonhealed humeral head was removed. The joint was suspended with 1 4.75 swivel lock anchor into the center of the glenoid using one fiber tape and 1 FiberWire to suspend the proximal humerus through drill tunnels. The intention was to prevent significant anterior displacement and allow pseudocapsule to form.  Estimated Blood Loss:  less than 100 mL         Drains: None   Blood Given: none          Specimens: none        Complications:  * No complications entered in OR log *         Disposition: PACU - hemodynamically stable.         Condition: stable    Procedure:   The patient was identified in the preoperative holding area where I personally marked the operative extremity after verifying with the patient and consent. She  was taken to the operating room where She was transferred to the   operative table.  The patient received an interscalene block in   the holding area by the attending anesthesiologist.  General anesthesia was induced   in the operating room without complication.  The patient did receive IV  Ancef prior to the commencement of the procedure.  The patient was   placed in the beach-chair position with the back raised about 30    degrees.  The nonoperative extremity and head and neck were carefully   positioned and padded protecting against neurovascular compromise.  The   left upper extremity was then prepped and draped in the standard sterile   fashion.    The appropriate operative time-out was performed with   Anesthesia, the perioperative staff, as well as myself and we all agreed   that the right side was the correct operative site.  An approximately  8 cm incision was made through her previous incision site. Dissection was carried down through subcutaneous tissues and the interval between the pectoralis major and deltoid was identified. The deltoid was taken laterally and the pectoralis major was taken medially. The plate and screws were encountered with copious amount of clear benign-appearing fluid which was evacuated. The plate and screws were then carefully exposed and removed without difficulty. All screws plate and pegs were removed and accounted for. At this point copious irrigation was used the nonhealed humeral head was identified and resected. There was a significant tendency for the proximal humeral shaft to protrude anteriorly given the lack of anterior capsular support and therefore I felt that a suspensory procedure to try and limit this while pseudocapsule formed would be of benefit to her. Therefore a 4.75 peak swivel lock anchor was placed centrally in the glenoid with a fiber tape and a FiberWire. Drill holes were then made and the proximal anterior humeral shaft proximally 1 cm apart and the  sutures were passed through the central canal and out anteriorly and tied over a bone bridge while holding the joint reduced. This allowed for a suspensory construct that prevented access anterior translation and hopefully will allow stable pseudocapsule to form to prevent excess anterior movement, dead space and possible skin issues.  At this point the wound was copiously irrigated with normal saline and subsequently   closed in layers with 2-0 Vicryls and 4-0 Monocryl. Light sterile dressing was applied. She was placed in a sling, allowed to awaken from anesthesia and taken to the recovery room in stable condition.    POSTOPERATIVE PLAN:  she will be kept overnight for observation and pain control and will be discharged back to her facility tomorrow. She will remain in a sling for about 6 weeks to allow pseudocapsule formation.

## 2015-06-23 ENCOUNTER — Encounter (HOSPITAL_COMMUNITY): Payer: Self-pay | Admitting: Orthopedic Surgery

## 2015-06-23 DIAGNOSIS — T8484XA Pain due to internal orthopedic prosthetic devices, implants and grafts, initial encounter: Secondary | ICD-10-CM | POA: Diagnosis not present

## 2015-06-23 LAB — GLUCOSE, CAPILLARY
Glucose-Capillary: 139 mg/dL — ABNORMAL HIGH (ref 65–99)
Glucose-Capillary: 265 mg/dL — ABNORMAL HIGH (ref 65–99)
Glucose-Capillary: 197 mg/dL — ABNORMAL HIGH (ref 65–99)

## 2015-06-23 LAB — CBC
HCT: 30.4 % — ABNORMAL LOW (ref 36.0–46.0)
HEMOGLOBIN: 9.4 g/dL — AB (ref 12.0–15.0)
MCH: 28.6 pg (ref 26.0–34.0)
MCHC: 30.9 g/dL (ref 30.0–36.0)
MCV: 92.4 fL (ref 78.0–100.0)
PLATELETS: 174 10*3/uL (ref 150–400)
RBC: 3.29 MIL/uL — ABNORMAL LOW (ref 3.87–5.11)
RDW: 14.9 % (ref 11.5–15.5)
WBC: 5.9 10*3/uL (ref 4.0–10.5)

## 2015-06-23 MED ORDER — OXYCODONE-ACETAMINOPHEN 5-325 MG PO TABS: 1.0000 | ORAL_TABLET | ORAL | Status: DC | PRN

## 2015-06-23 MED ORDER — INSULIN ASPART 100 UNIT/ML ~~LOC~~ SOLN
0.0000 [IU] | Freq: Every day | SUBCUTANEOUS | Status: DC
  Administered 2015-06-23: 5 [IU] via SUBCUTANEOUS

## 2015-06-23 MED ORDER — INSULIN ASPART 100 UNIT/ML ~~LOC~~ SOLN
0.0000 [IU] | Freq: Three times a day (TID) | SUBCUTANEOUS | Status: DC
  Administered 2015-06-23: 3 [IU] via SUBCUTANEOUS
  Administered 2015-06-23: 8 [IU] via SUBCUTANEOUS

## 2015-06-23 NOTE — Progress Notes (Signed)
   PATIENT ID: [name]   1 Day Post-Op Procedure(s) (LRB): HARDWARE REMOVAL GLENOHUMERAL SUSPENSION (Right)  Subjective: Doing well this am, some pain right shoulder.   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

## 2015-06-23 NOTE — Progress Notes (Addendum)
Report called to Butch Penny at [facility]. Peripheral IV removed, belongings gathered. Waiting for transport.  After transport arrived for pt, pt stated that she was missing a bag of clothes and a pair of pink shoes. Bag was not in room and had not been taken back to facility per pt. RN called security, short stay and PACU. Clothes and shoes were never located.   [place], Jerry Caras

## 2015-06-23 NOTE — Discharge Instructions (Addendum)
Discharge Instructions after Open Shoulder Repair  A sling has been provided for you. Remain in your sling at all times.  Use ice on the shoulder intermittently over the first 48 hours after surgery.  Pain medicine has been prescribed for you.  Use your medicine liberally over the first 48 hours, and then you can begin to taper your use. You may take Extra Strength Tylenol or Tylenol only in place of the pain pills. DO NOT take ANY nonsteroidal anti-inflammatory pain medications: Advil, Motrin, Ibuprofen, Aleve, Naproxen or Naprosyn.  Your dressing is waterproof, you can shower with it on. Leave it on until the first visit with Dr. Tamera Punt in two weeks.  Take one aspirin, a day for 2 weeks after surgery, unless you have an aspirin sensitivity/ allergy or asthma.   Please call [phone] during normal business hours or [phone] after hours for any problems. Including the following:  - excessive redness of the incisions - drainage for more than 4 days - fever of more than 101.5 F  *Please note that pain medications will not be refilled after hours or on weekends.

## 2015-06-23 NOTE — Clinical Social Work Note (Signed)
Clinical Social Work Assessment  Patient Details  Name: [name] MRN: [id number] Date of Birth: May 04, 1946  Date of referral:  06/23/15               Reason for consult:  Other (Comment Required) (from facility)                Permission sought to share information with:  Chartered certified accountant granted to share information::  Yes, Verbal Permission Granted  Name::        Agency::   ([facility])  Relationship::     Contact Information:     Housing/Transportation Living arrangements for the past 2 months:  [place] of Information:  Patient Patient Interpreter Needed:  None Criminal Activity/Legal Involvement Pertinent to Current Situation/Hospitalization:  No - Comment as needed Significant Relationships:  Adult Children Lives with:    Do you feel safe going back to the place where you live?  Yes Need for family participation in patient care:  No (Coment)  Care giving concerns:  No caregiver present during the course of this assessment   Social Worker assessment / plan:  Patient is from [place] and plans to return to ALF once medically stable for discharge.  Patient is observation status and will be discharged today between 12-1pm.  Patient, RN and ALF agreeable.  Employment status:  Retired Forensic scientist:  Medicaid In [place], [place] PT Recommendations:  No Follow Up Information / Referral to community resources:   (none)  Patient/Family's Response to care:  Patient is agreeable to discharge plans and appreciates assistance from both ALF and CSW.  Patient/Family's Understanding of and Emotional Response to Diagnosis, Current Treatment, and Prognosis:  Patient is realistic regarding prognosis and diagnosis.    Emotional Assessment Appearance:  Appears stated age Attitude/Demeanor/Rapport:    Affect (typically observed):  Accepting, Adaptable Orientation:  Oriented to Self, Oriented to Place, Oriented to   Time, Oriented to Situation Alcohol / Substance use:  Not Applicable Psych involvement (Current and /or in the community):     Discharge Needs  Concerns to be addressed:  No discharge needs identified Readmission within the last 30 days:    Current discharge risk:  None Barriers to Discharge:  No Barriers Identified   Gabriela Fanny, LCSW 06/23/2015, 9:53 AM

## 2015-06-23 NOTE — Discharge Summary (Signed)
Patient ID: [name] MRN: [id number] DOB/AGE: April 27, 1946 69 y.o.  Admit date: 06/22/2015 Discharge date: 06/23/2015  Admission Diagnoses:  Active Problems:   S/P hardware removal   Discharge Diagnoses:  Same  Past Medical History  Diagnosis Date  . H/O: stroke 2012, 2000,     X3  . Anxiety   . PTSD (post-traumatic stress disorder)   . Depression   . SVD (spontaneous vaginal delivery)     x 1  . Smoker   . Osteoarthritis     knees, hips hands,   . Shortness of breath   . Pain     SEVERE PAIN BACK, RIGHT HIP AND KNEES--HARRINGTON RODS AND CERVICAL PLATES--USES WALKER OR CANE WHEN AMBULATING - GOES TO A PAIN CLINIC-STATES SHE NEEDS RT HIP AND BOTH KNEES REPLACED. PT STATES SHE WAS TOLD THE CEMENT AROUND THE HARRINGTON RODS IS CRACKED.  [name] Hot flashes, menopausal     SEVERE  . Complication of anesthesia 03/02/13    severe headache,vertigo postop  . COPD (chronic obstructive pulmonary disease)     stable  . 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 Lung cancer   . Status post radiation therapy within four to twelve weeks 10/18/13-12/06/13    lung ca  66Gy  . Family history of anesthesia complication     equipment failure caused problem, suffered a stroke & heartattack  . Diabetes mellitus, type II   . Diabetes mellitus without complication   . Cancer   . Hypertension   . COPD (chronic obstructive pulmonary disease)   . Squamous cell lung cancer 10/02/2013  . Pneumonia   . Hypothyroidism   . History of stomach ulcers   . History of hiatal hernia   . PONV (postoperative nausea and vomiting)     Surgeries: Procedure(s): HARDWARE REMOVAL GLENOHUMERAL SUSPENSION on 06/22/2015   Consultants:    Discharged Condition: Improved  Hospital Course: [name] is an 69 y.o. female who was admitted 06/22/2015 for operative treatment of hardware removal painful hardware s/p ORIF with nonunion. Patient has severe unremitting pain that affects sleep, daily activities, and  work/hobbies. After pre-op clearance the patient was taken to the operating room on 06/22/2015 and underwent  Procedure(s): HARDWARE REMOVAL GLENOHUMERAL SUSPENSION.    Patient was given perioperative antibiotics: Anti-infectives    Start     Dose/Rate Route Frequency Ordered Stop   06/22/15 1730  ceFAZolin (ANCEF) IVPB 1 g/50 mL premix     1 g 100 mL/hr over 30 Minutes Intravenous Every 6 hours 06/22/15 1708 06/23/15 0616   06/22/15 1300  ceFAZolin (ANCEF) IVPB 2 g/50 mL premix     2 g 100 mL/hr over 30 Minutes Intravenous To ShortStay Surgical 06/21/15 1324 06/22/15 1400       Patient was given sequential compression devices, early ambulation to prevent DVT.  Patient benefited maximally from hospital stay and there were no complications.    Recent vital signs: Patient Vitals for the past 24 hrs:  BP Temp Temp src Pulse Resp SpO2 Height Weight  06/23/15 0423 (!) 128/47 mmHg 98.1 F (36.7 C) - (!) 58 16 99 % - -  06/22/15 2359 (!) 132/55 mmHg 98.2 F (36.8 C) - 64 16 98 % - -  06/22/15 1950 (!) 128/58 mmHg 97.7 F (36.5 C) - 63 16 97 % - -  06/22/15 1700 (!) 126/55 mmHg 97 F (36.1 C) - 64 16 98 % - -  06/22/15 1645 - 98.1 F (36.7 C) -  62 - 92 % - -  06/22/15 1630 (!) 128/57 mmHg - - 61 13 92 % - -  06/22/15 1615 140/68 mmHg - - 62 14 91 % - -  06/22/15 1600 137/64 mmHg 98.2 F (36.8 C) - 64 16 97 % - -  06/22/15 1345 (!) 156/59 mmHg - - 66 16 99 % - -  06/22/15 1335 (!) 156/60 mmHg - - 67 15 100 % - -  06/22/15 1330 - - - 60 15 - - -  06/22/15 1325 (!) 158/66 mmHg - - 66 16 100 % - -  06/22/15 1320 (!) 146/68 mmHg - - 65 14 100 % - -  06/22/15 1315 (!) 142/65 mmHg - - 63 16 100 % - -  06/22/15 1126 (!) 103/59 mmHg 97.9 F (36.6 C) Oral (!) 59 18 97 % '5\' 6"'$  (1.676 m) 70.852 kg (156 lb 3.2 oz)     Recent laboratory studies:  Recent Labs  06/20/15 1150 06/23/15 0355  WBC 6.9 5.9  HGB 10.8* 9.4*  HCT 35.7* 30.4*  PLT 192 174  NA 132*  --   K 3.8  --   CL 93*  --    CO2 32  --   BUN 5*  --   CREATININE 0.69  --   GLUCOSE 271*  --   CALCIUM 7.9*  --      Discharge Medications:     Medication List    STOP taking these medications        acetaminophen 325 MG tablet  Commonly known as:  TYLENOL      TAKE these medications        albuterol (2.5 MG/3ML) 0.083% nebulizer solution  Commonly known as:  PROVENTIL  Take 2.5 mg by nebulization every 6 (six) hours as needed for wheezing or shortness of breath.     albuterol 108 (90 BASE) MCG/ACT inhaler  Commonly known as:  VENTOLIN HFA  Inhale 2 puffs into the lungs every 6 (six) hours as needed for wheezing or shortness of breath.     albuterol 108 (90 BASE) MCG/ACT inhaler  Commonly known as:  PROVENTIL HFA;VENTOLIN HFA  2 puffs every 4 - 6 hours as needed for dyspnea     ALPRAZolam 1 MG tablet  Commonly known as:  XANAX  Take 1 mg by mouth daily.     alprazolam 2 MG tablet  Commonly known as:  XANAX  Take 1 tablet (2 mg total) by mouth 3 (three) times daily.     ANORO ELLIPTA 62.5-25 MCG/INH Aepb  Generic drug:  Umeclidinium-Vilanterol  Inhale 1 puff into the lungs daily.     atenolol 50 MG tablet  Commonly known as:  TENORMIN  Take 1 tablet (50 mg total) by mouth daily.     chlorzoxazone 500 MG tablet  Commonly known as:  PARAFON  Take 1 tablet (500 mg total) by mouth 4 (four) times daily.     esomeprazole 40 MG capsule  Commonly known as:  NEXIUM  Take 1 capsule (40 mg total) by mouth daily.     fentaNYL 25 MCG/HR patch  Commonly known as:  DURAGESIC - dosed mcg/hr  Place 1 patch (25 mcg total) onto the skin every 3 (three) days.     fluticasone 50 MCG/ACT nasal spray  Commonly known as:  FLONASE  Place 1 spray into both nostrils daily as needed for allergies or rhinitis.     guaiFENesin 600 MG 12 hr tablet  Commonly  known as:  MUCINEX  Take 1,200 mg by mouth 2 (two) times daily.     insulin aspart 100 UNIT/ML FlexPen  Commonly known as:  NOVOLOG FLEXPEN  Inject 5  Units into the skin 3 (three) times daily with meals.     Insulin Glargine 100 UNIT/ML Solostar Pen  Commonly known as:  LANTUS SOLOSTAR  Inject 20 Units into the skin daily at 10 pm.     Insulin Pen Needle 30G X 8 MM Misc  Commonly known as:  NOVOFINE  Inject 1 packet into the skin as needed.     levothyroxine 125 MCG tablet  Commonly known as:  SYNTHROID  Take 1 tablet (125 mcg total) by mouth daily before breakfast.     lidocaine-prilocaine cream  Commonly known as:  EMLA  Apply a quarter size amount to port site 1 hour prior to chemo. Do not rub in. Cover with plastic wrap.     lisinopril 5 MG tablet  Commonly known as:  PRINIVIL,ZESTRIL  Take 1 tablet (5 mg total) by mouth daily.     MOVANTIK 25 MG Tabs tablet  Generic drug:  naloxegol oxalate  Take 25 mg by mouth daily.     ondansetron 8 MG tablet  Commonly known as:  ZOFRAN  Take 1 tablet (8 mg total) by mouth every 8 (eight) hours as needed for nausea or vomiting.     oxyCODONE-acetaminophen 5-325 MG per tablet  Commonly known as:  PERCOCET/ROXICET  Take 1-2 tablets by mouth every 4 (four) hours as needed for moderate pain or severe pain.     sertraline 100 MG tablet  Commonly known as:  ZOLOFT  Take 2 tablets (200 mg total) by mouth daily.     spironolactone 50 MG tablet  Commonly known as:  ALDACTONE  Take 2 tablets (100 mg total) by mouth daily.     tiotropium 18 MCG inhalation capsule  Commonly known as:  SPIRIVA  Place 1 capsule (18 mcg total) into inhaler and inhale daily.     traZODone 150 MG tablet  Commonly known as:  DESYREL  Take 2 tablets (300 mg total) by mouth at bedtime.        Diagnostic Studies: No results found.  Disposition: [facility] Not Defined        Follow-up Information    Follow up with Nita Sells, MD. Schedule an appointment as soon as possible for a visit in 2 weeks.   Specialty:  Orthopedic Surgery   Contact information:   [facility] [place] [address] [phone]        Signed: Grier Mitts 06/23/2015, 8:30 AM

## 2015-06-23 NOTE — Discharge Planning (Signed)
Patient will discharge today per MD order. Patient will discharge back to: [facility] RN to call report prior to transportation to: [phone] Transportation: ALF  CSW sent discharge summary to SNF for review.  Packet is complete.  RN, patient and family aware of discharge plans.  Nonnie Done, [place] [phone]  Psychiatric & Orthopedics (5N 1-16) Clinical Social Worker

## 2015-06-23 NOTE — Progress Notes (Signed)
  Pharmacy Discharge Medication Therapy Review   Total Number of meds on admission ____24_____ (polypharmacy > 10 meds)  Indications for all medications: '[x]'$  Yes       '[]'$  No  Adherence Review  '[]'$  Excellent (no doses missed/week)     '[]'$  Good (no more than 1 dose missed/week)     '[]'$  Partial (2-3 doses missed/week)     '[]'$  Poor (>3 doses missed/week)     '[x]'$  Not assessed  Total number of high risk medications _2__ (Anticoagulants, Dual antiplatelets, oral Antihyperglycemic agents,Insulins, Antipsychotics, Anti-Seizure meds, Inhalers, HF/ACS meds, Antibiotics and HIV medications)   Assessment: (Medication related problems)  Intervention  YES NO  Explanation   Indications      Medication without noted indication '[]'$  '[x]'$     Indication without noted medication '[]'$  '[x]'$     Duplicate therapy '[x]'$  '[]'$  Duplicate xanax upon discharge with two different strengths.  Patient reports on Xanax 2 mg by mouth twice daily.  After clarifying with PA, patient to resume home dose medication regimen.    Duplicate albuterol inhalers addressed.  Efficacy      Suboptimal drug or dose selection '[]'$  '[x]'$    Insufficient dose/duration '[]'$  '[x]'$    Failure to receive therapy  (Rx not filled) '[]'$  '[x]'$     Safety      Adverse drug event '[]'$  '[x]'$     Drug interaction '[]'$  '[x]'$     Excessive dose/duration '[]'$  '[x]'$    High-risk medications '[]'$  '[x]'$    Compliance     Underuse '[]'$  '[x]'$     Overuse '[x]'$  '[]'$  Of note, patient on multiple pain medications as well as those that affect the CNS.  Further discussion in the future regarding optimal use of xanax, chlorzoxazone, sertraline, and trazodone.    Other pertinent pharmacist counseling '[]'$  '[x]'$      Total number of new medications upon discharge: ____0_____  Time:  Time spent preparing for discharge counseling: 0 min. Time spent counseling patient: 0 min. Additional time spent on discharge (specify): 10 min.  PLAN: Consider the following at discharge/Recommendations discussed with provider -  DC duplicate Xanax, change to Xanax 2 mg by mouth twice daily. - DC duplicate albuterol inhalers  Hassie Bruce, Pharm. D. Clinical Pharmacy Resident Pager: [phone] Ph: [phone] 06/23/2015 1:10 PM

## 2015-06-25 ENCOUNTER — Encounter (HOSPITAL_COMMUNITY): Payer: Self-pay | Admitting: Emergency Medicine

## 2015-06-25 ENCOUNTER — Emergency Department (HOSPITAL_COMMUNITY)
Admission: EM | Admit: 2015-06-25 | Discharge: 2015-06-25 | Disposition: A | Discharge status: Home / Self Care | Payer: Medicare Other | Attending: Emergency Medicine | Admitting: Emergency Medicine

## 2015-06-25 DIAGNOSIS — Z859 Personal history of malignant neoplasm, unspecified: Secondary | ICD-10-CM | POA: Insufficient documentation

## 2015-06-25 DIAGNOSIS — E119 Type 2 diabetes mellitus without complications: Secondary | ICD-10-CM | POA: Diagnosis not present

## 2015-06-25 DIAGNOSIS — M96830 Postprocedural hemorrhage and hematoma of a musculoskeletal structure following a musculoskeletal system procedure: Secondary | ICD-10-CM

## 2015-06-25 DIAGNOSIS — M159 Polyosteoarthritis, unspecified: Secondary | ICD-10-CM | POA: Diagnosis not present

## 2015-06-25 DIAGNOSIS — I10 Essential (primary) hypertension: Secondary | ICD-10-CM | POA: Insufficient documentation

## 2015-06-25 DIAGNOSIS — F419 Anxiety disorder, unspecified: Secondary | ICD-10-CM | POA: Insufficient documentation

## 2015-06-25 DIAGNOSIS — Z8639 Personal history of other endocrine, nutritional and metabolic disease: Secondary | ICD-10-CM | POA: Insufficient documentation

## 2015-06-25 DIAGNOSIS — Z79899 Other long term (current) drug therapy: Secondary | ICD-10-CM | POA: Diagnosis not present

## 2015-06-25 DIAGNOSIS — Z85118 Personal history of other malignant neoplasm of bronchus and lung: Secondary | ICD-10-CM | POA: Insufficient documentation

## 2015-06-25 DIAGNOSIS — Z8673 Personal history of transient ischemic attack (TIA), and cerebral infarction without residual deficits: Secondary | ICD-10-CM | POA: Diagnosis not present

## 2015-06-25 DIAGNOSIS — Z8719 Personal history of other diseases of the digestive system: Secondary | ICD-10-CM | POA: Insufficient documentation

## 2015-06-25 DIAGNOSIS — F329 Major depressive disorder, single episode, unspecified: Secondary | ICD-10-CM | POA: Insufficient documentation

## 2015-06-25 DIAGNOSIS — J449 Chronic obstructive pulmonary disease, unspecified: Secondary | ICD-10-CM | POA: Diagnosis not present

## 2015-06-25 DIAGNOSIS — F4310 Post-traumatic stress disorder, unspecified: Secondary | ICD-10-CM | POA: Diagnosis not present

## 2015-06-25 DIAGNOSIS — Z720 Tobacco use: Secondary | ICD-10-CM | POA: Diagnosis not present

## 2015-06-25 DIAGNOSIS — Z8701 Personal history of pneumonia (recurrent): Secondary | ICD-10-CM | POA: Diagnosis not present

## 2015-06-25 DIAGNOSIS — Z923 Personal history of irradiation: Secondary | ICD-10-CM | POA: Insufficient documentation

## 2015-06-25 DIAGNOSIS — Z794 Long term (current) use of insulin: Secondary | ICD-10-CM | POA: Diagnosis not present

## 2015-06-25 LAB — CBC WITH DIFFERENTIAL/PLATELET
Basophils Absolute: 0 10*3/uL (ref 0.0–0.1)
Basophils Relative: 1 % (ref 0–1)
Eosinophils Absolute: 0.1 10*3/uL (ref 0.0–0.7)
Eosinophils Relative: 1 % (ref 0–5)
HCT: 34.1 % — ABNORMAL LOW (ref 36.0–46.0)
Hemoglobin: 10.7 g/dL — ABNORMAL LOW (ref 12.0–15.0)
Lymphocytes Relative: 12 % (ref 12–46)
Lymphs Abs: 0.6 10*3/uL — ABNORMAL LOW (ref 0.7–4.0)
MCH: 29 pg (ref 26.0–34.0)
MCHC: 31.4 g/dL (ref 30.0–36.0)
MCV: 92.4 fL (ref 78.0–100.0)
Monocytes Absolute: 0.4 10*3/uL (ref 0.1–1.0)
Monocytes Relative: 7 % (ref 3–12)
Neutro Abs: 4.2 10*3/uL (ref 1.7–7.7)
Neutrophils Relative %: 79 % — ABNORMAL HIGH (ref 43–77)
Platelets: 196 10*3/uL (ref 150–400)
RBC: 3.69 MIL/uL — ABNORMAL LOW (ref 3.87–5.11)
RDW: 15 % (ref 11.5–15.5)
WBC: 5.3 10*3/uL (ref 4.0–10.5)

## 2015-06-25 LAB — COMPREHENSIVE METABOLIC PANEL
ALT: 6 U/L — ABNORMAL LOW (ref 14–54)
AST: 15 U/L (ref 15–41)
Albumin: 3.2 g/dL — ABNORMAL LOW (ref 3.5–5.0)
Alkaline Phosphatase: 91 U/L (ref 38–126)
Anion gap: 9 (ref 5–15)
BUN: 6 mg/dL (ref 6–20)
CO2: 31 mmol/L (ref 22–32)
Calcium: 8 mg/dL — ABNORMAL LOW (ref 8.9–10.3)
Chloride: 91 mmol/L — ABNORMAL LOW (ref 101–111)
Creatinine, Ser: 0.67 mg/dL (ref 0.44–1.00)
GFR calc Af Amer: >60 mL/min (ref >60)
GFR calc non Af Amer: >60 mL/min (ref >60)
Glucose, Bld: 193 mg/dL — ABNORMAL HIGH (ref 65–99)
Potassium: 3.8 mmol/L (ref 3.5–5.1)
Sodium: 131 mmol/L — ABNORMAL LOW (ref 135–145)
Total Bilirubin: 0.4 mg/dL (ref 0.3–1.2)
Total Protein: 6.1 g/dL — ABNORMAL LOW (ref 6.5–8.1)

## 2015-06-25 MED ORDER — OXYCODONE-ACETAMINOPHEN 5-325 MG PO TABS
2.0000 | ORAL_TABLET | Freq: Once | ORAL | Status: AC
  Administered 2015-06-25: 2 via ORAL
  Filled 2015-06-25: qty 2

## 2015-06-25 NOTE — ED Notes (Signed)
No bleeding

## 2015-06-25 NOTE — ED Notes (Signed)
Still no bleeding.  Dressing applied.

## 2015-06-25 NOTE — ED Notes (Signed)
Reapplied steri strips.

## 2015-06-25 NOTE — ED Provider Notes (Signed)
TIME SEEN: 10:50 AM  CHIEF COMPLAINT: post-op problem  HPI:  [name] is a 69 y.o. female, with a PMhx of COPD, DM, CVA, lung cancer, and HTN, who presents to the Emergency Department complaining of sudden onset, gradually worsening bleeding from a surgical incision on her right shoulder that began last night. She states she tripped over 2 wheelchairs and fell last night catching herself with her right arm which she attributes to the increased bleeding.  Denies LOC or head injury. The pt had hardware removal surgery (s/p ORIF R proximal humerus fracture with failure of fixation and prominent hardware) in her right shoulder by Tania Ade, MD 3 days ago. Her next follow up appointment with Dr. Tamera Punt is in 2 weeks. She denies any worsening of pain since the surgery.  Additionally denies bleeding disorders, or taking blood thinning medication. Pt is a resident at [facility] long term car facility. She is right handed. Denies any other injury. Denies any new pain. States she is having pain since her surgery 3 days ago that is improved with her oxycodone and not significantly worse. No fever.   ROS: See HPI Constitutional: no fever  Eyes: no drainage  ENT: no runny nose   Cardiovascular:  no chest pain  Resp: no SOB  GI: no vomiting GU: no dysuria Integumentary: no rash  Allergy: no hives  Musculoskeletal: no leg swelling  Neurological: no slurred speech ROS otherwise negative  PAST MEDICAL HISTORY/PAST SURGICAL HISTORY:  Past Medical History  Diagnosis Date  . H/O: stroke 2012, 2000,     X3  . Anxiety   . PTSD (post-traumatic stress disorder)   . Depression   . SVD (spontaneous vaginal delivery)     x 1  . Smoker   . Osteoarthritis     knees, hips hands,   . Shortness of breath   . Pain     SEVERE PAIN BACK, RIGHT HIP AND KNEES--HARRINGTON RODS AND CERVICAL PLATES--USES WALKER OR CANE WHEN AMBULATING - GOES TO A PAIN CLINIC-STATES SHE NEEDS RT HIP AND BOTH KNEES  REPLACED. PT STATES SHE WAS TOLD THE CEMENT AROUND THE HARRINGTON RODS IS CRACKED.  [name] Hot flashes, menopausal     SEVERE  . Complication of anesthesia 03/02/13    severe headache,vertigo postop  . COPD (chronic obstructive pulmonary disease)     stable  . 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 Lung cancer   . Status post radiation therapy within four to twelve weeks 10/18/13-12/06/13    lung ca  66Gy  . Family history of anesthesia complication     equipment failure caused problem, suffered a stroke & heartattack  . Diabetes mellitus, type II   . Diabetes mellitus without complication   . Cancer   . Hypertension   . COPD (chronic obstructive pulmonary disease)   . Squamous cell lung cancer 10/02/2013  . Pneumonia   . Hypothyroidism   . History of stomach ulcers   . History of hiatal hernia   . PONV (postoperative nausea and vomiting)     MEDICATIONS:  Prior to Admission medications   Medication Sig Start Date End Date Taking? Authorizing Provider  albuterol (PROVENTIL HFA;VENTOLIN HFA) 108 (90 BASE) MCG/ACT inhaler 2 puffs every 4 - 6 hours as needed for dyspnea Patient not taking: Reported on 06/15/2015 04/07/15   Patrici Ranks, MD  albuterol (PROVENTIL) (2.5 MG/3ML) 0.083% nebulizer solution Take 2.5 mg by nebulization every 6 (six) hours as needed for wheezing or  shortness of breath.    Historical Provider, MD  albuterol (VENTOLIN HFA) 108 (90 BASE) MCG/ACT inhaler Inhale 2 puffs into the lungs every 6 (six) hours as needed for wheezing or shortness of breath. 03/15/15   Erline Hau, MD  ALPRAZolam Duanne Moron) 1 MG tablet Take 1 mg by mouth daily.    Historical Provider, MD  alprazolam Duanne Moron) 2 MG tablet Take 1 tablet (2 mg total) by mouth 3 (three) times daily. Patient taking differently: Take 2 mg by mouth 2 (two) times daily.  05/31/15   Cloria Spring, MD  ANORO ELLIPTA 62.5-25 MCG/INH AEPB Inhale 1 puff into the lungs daily. 03/17/15   Historical Provider, MD  atenolol  (TENORMIN) 50 MG tablet Take 1 tablet (50 mg total) by mouth daily. 03/15/15   Erline Hau, MD  chlorzoxazone (PARAFON) 500 MG tablet Take 1 tablet (500 mg total) by mouth 4 (four) times daily. 03/15/15   Erline Hau, MD  esomeprazole (NEXIUM) 40 MG capsule Take 1 capsule (40 mg total) by mouth daily. 03/15/15   Erline Hau, MD  fentaNYL (DURAGESIC - DOSED MCG/HR) 25 MCG/HR patch Place 1 patch (25 mcg total) onto the skin every 3 (three) days. Patient not taking: Reported on 06/15/2015 06/06/15   Manon Hilding Kefalas, PA-C  fluticasone [facility]) 50 MCG/ACT nasal spray Place 1 spray into both nostrils daily as needed for allergies or rhinitis. 03/15/15   Erline Hau, MD  guaiFENesin (MUCINEX) 600 MG 12 hr tablet Take 1,200 mg by mouth 2 (two) times daily.    Historical Provider, MD  insulin aspart (NOVOLOG FLEXPEN) 100 UNIT/ML FlexPen Inject 5 Units into the skin 3 (three) times daily with meals. Patient taking differently: Inject 3-9 Units into the skin 3 (three) times daily with meals. 3 units every morning before breakfast and 9 units before lunch and dinner 03/15/15   Erline Hau, MD  Insulin Glargine (LANTUS SOLOSTAR) 100 UNIT/ML Solostar Pen Inject 20 Units into the skin daily at 10 pm. Patient taking differently: Inject 21 Units into the skin daily at 10 pm.  03/15/15   Erline Hau, MD  Insulin Pen Needle (NOVOFINE) 30G X 8 MM MISC Inject 1 packet into the skin as needed.    Historical Provider, MD  levothyroxine (SYNTHROID) 125 MCG tablet Take 1 tablet (125 mcg total) by mouth daily before breakfast. 06/20/15   Baird Cancer, PA-C  lidocaine-prilocaine (EMLA) cream Apply a quarter size amount to port site 1 hour prior to chemo. Do not rub in. Cover with plastic wrap. 06/20/15   Baird Cancer, PA-C  lisinopril (PRINIVIL,ZESTRIL) 5 MG tablet Take 1 tablet (5 mg total) by mouth daily. 03/15/15   Erline Hau, MD   MOVANTIK 25 MG TABS tablet Take 25 mg by mouth daily. 05/19/15   Historical Provider, MD  ondansetron (ZOFRAN) 8 MG tablet Take 1 tablet (8 mg total) by mouth every 8 (eight) hours as needed for nausea or vomiting. 04/07/15   Patrici Ranks, MD  oxyCODONE-acetaminophen (PERCOCET/ROXICET) 5-325 MG per tablet Take 1-2 tablets by mouth every 4 (four) hours as needed for moderate pain or severe pain. 06/23/15   Grier Mitts, PA-C  sertraline (ZOLOFT) 100 MG tablet Take 2 tablets (200 mg total) by mouth daily. 05/31/15   Cloria Spring, MD  spironolactone (ALDACTONE) 50 MG tablet Take 2 tablets (100 mg total) by mouth daily. Patient not taking: Reported on  05/31/2015 05/23/15   Baird Cancer, PA-C  tiotropium (SPIRIVA) 18 MCG inhalation capsule Place 1 capsule (18 mcg total) into inhaler and inhale daily. Patient not taking: Reported on 05/23/2015 03/15/15   Erline Hau, MD  traZODone (DESYREL) 150 MG tablet Take 2 tablets (300 mg total) by mouth at bedtime. 05/31/15   Cloria Spring, MD    ALLERGIES:  Allergies  Allergen Reactions  . Carafate [Sucralfate] Nausea Only  . Prednisone Other (See Comments)    insomnia  . Nsaids     GI Upset  . Aspirin Other (See Comments)    Gi symptoms. Does NOT take ibuprofen or other NSAIDS  . Aspirin Nausea And Vomiting  . Ibuprofen     Gi upset  . Nsaids Nausea And Vomiting  . Dulera [Mometasone Furo-Formoterol Fum] Palpitations    Panting " my body was twisted inside and out"    SOCIAL HISTORY:  History  Substance Use Topics  . Smoking status: Current Every Day Smoker -- 0.25 packs/day for 50 years    Types: Cigarettes  . Smokeless tobacco: Never Used  . Alcohol Use: No    FAMILY HISTORY: Family History  Problem Relation Age of Onset  . Cancer Mother     COLON  . Depression Mother   . Heart disease Father   . Diabetes Maternal Aunt   . Cancer Maternal Grandfather     KIDNEY   . Diabetes Maternal Grandfather   .  Hypertension Maternal Grandfather   . Heart disease Maternal Grandfather   . Drug abuse Sister     EXAM: BP 133/67 mmHg  Pulse 69  Temp(Src) 98.5 F (36.9 C) (Oral)  Resp 18  Ht '5\' 6"'$  (1.676 m)  Wt 160 lb (72.576 kg)  BMI 25.84 kg/m2  SpO2 94% CONSTITUTIONAL: Alert and oriented and responds appropriately to questions. Elderly, in no distress HEAD: Normocephalic EYES: Conjunctivae clear, PERRL ENT: normal nose; no rhinorrhea; moist mucous membranes; pharynx without lesions noted NECK: Supple, no meningismus, no LAD  CARD: RRR; S1 and S2 appreciated; no murmurs, no clicks, no rubs, no gallops RESP: Normal chest excursion without splinting or tachypnea; breath sounds clear and equal bilaterally; no wheezes, no rhonchi, no rales, no hypoxia or respiratory distress, speaking full sentences ABD/GI: Normal bowel sounds; non-distended; soft, non-tender, no rebound, no guarding, no peritoneal signs BACK:  The back appears normal and is non-tender to palpation, there is no CVA tenderness EXT: Normal ROM in all joints; non-tender to palpation; no edema; normal capillary refill; no cyanosis, no calf tenderness or swelling ; 10 cm surgical incision over anterior aspect of right shoulder with one small area that is oozing serosanguinous fluid; incision appears intact with no erythema or warmth; no purulent drainage; compartments are soft; 2+ right radial pulse   SKIN: Normal color for age and race; warm NEURO: Moves all extremities equally, sensation to light touch intact diffusely, cranial nerves II through XII intact PSYCH: The patient's mood and manner are appropriate. Grooming and personal hygiene are appropriate.  MEDICAL DECISION MAKING: Patient here with bleeding from her incision. The incision however is intact. There is no sign of infection. Bleeding was slow, oozing and quickly stopped after pressure applied. Labs show no significant decrease in her hemoglobin and normal platelets. We have  monitored her for over one hour with no return of her bleeding. We'll redress wound and discharge back to her nursing facility. She is orthopedic follow-up scheduled. Discussed return precautions. She verbalized understanding  and is comfortable with this plan.    I personally performed the services described in this documentation, which was scribed in my presence. The recorded information has been reviewed and is accurate.     [place], DO 06/25/15 1547

## 2015-06-25 NOTE — ED Notes (Signed)
Patient c/o bleeding and pain at post-op site to right shoulder. Per patient she had right shoulder surgery on Thursday and fell last night hitting shoulder on dresser. Per patient her feet just got tangled up. Dressing to site soaked with blood and small amount of active bleeding leaking from drainage.

## 2015-06-25 NOTE — Discharge Instructions (Signed)
You were seen for bleeding from your incision site. This stopped quickly after she was held for 5 minutes. If you began bleeding again I recommend holding pressure and if this does not stop after 15 minutes of direct constant pressure please return to the emergency department. Your blood counts today were normal. No sign of infection on your exam. Please follow up with your primary care doctor as needed and your orthopedic surgeon as scheduled.   Incision Care An incision is when a surgeon cuts into your body tissues. After surgery, the incision needs to be cared for properly to prevent infection.  HOME CARE INSTRUCTIONS   Take all medicine as directed by your caregiver. Only take over-the-counter or prescription medicines for pain, discomfort, or fever as directed by your caregiver.  Do not remove your bandage (dressing) or get your incision wet until your surgeon gives you permission. In the event that your dressing becomes wet, dirty, or starts to smell, change the dressing and call your surgeon for instructions as soon as possible.  Take showers. Do not take tub baths, swim, or do anything that may soak the wound until it is healed.  Resume your normal diet and activities as directed or allowed.  Avoid lifting any weight until you are instructed otherwise.  Use anti-itch antihistamine medicine as directed by your caregiver. The wound may itch when it is healing. Do not pick or scratch at the wound.  Follow up with your caregiver for stitch (suture) or staple removal as directed.  Drink enough fluids to keep your urine clear or pale yellow. SEEK MEDICAL CARE IF:   You have redness, swelling, or increasing pain in the wound that is not controlled with medicine.  You have drainage, blood, or pus coming from the wound that lasts longer than 1 day.  You develop muscle aches, chills, or a general ill feeling.  You notice a bad smell coming from the wound or dressing.  Your wound edges  separate after the sutures, staples, or skin adhesive strips have been removed.  You develop persistent nausea or vomiting. SEEK IMMEDIATE MEDICAL CARE IF:   You have a fever.  You develop a rash.  You develop dizzy episodes or faint while standing.  You have difficulty breathing.  You develop any reaction or side effects to medicine given. MAKE SURE YOU:   Understand these instructions.  Will watch your condition.  Will get help right away if you are not doing well or get worse. Document Released: 07/05/2005 Document Revised: 03/09/2012 Document Reviewed: 02/09/2014 [facility] Patient Information 2015 [place], [place]. This information is not intended to replace advice given to you by your health care provider. Make sure you discuss any questions you have with your health care provider.

## 2015-06-28 ENCOUNTER — Emergency Department (HOSPITAL_COMMUNITY)
Admission: EM | Admit: 2015-06-28 | Discharge: 2015-06-28 | Disposition: A | Discharge status: Home / Self Care | Payer: Medicare Other | Attending: Emergency Medicine | Admitting: Emergency Medicine

## 2015-06-28 ENCOUNTER — Emergency Department (HOSPITAL_COMMUNITY): Payer: Medicare Other

## 2015-06-28 ENCOUNTER — Encounter (HOSPITAL_COMMUNITY): Payer: Self-pay | Admitting: Emergency Medicine

## 2015-06-28 DIAGNOSIS — Z923 Personal history of irradiation: Secondary | ICD-10-CM | POA: Diagnosis not present

## 2015-06-28 DIAGNOSIS — M159 Polyosteoarthritis, unspecified: Secondary | ICD-10-CM | POA: Diagnosis not present

## 2015-06-28 DIAGNOSIS — F4310 Post-traumatic stress disorder, unspecified: Secondary | ICD-10-CM | POA: Insufficient documentation

## 2015-06-28 DIAGNOSIS — Z794 Long term (current) use of insulin: Secondary | ICD-10-CM | POA: Diagnosis not present

## 2015-06-28 DIAGNOSIS — J449 Chronic obstructive pulmonary disease, unspecified: Secondary | ICD-10-CM | POA: Diagnosis not present

## 2015-06-28 DIAGNOSIS — Z79899 Other long term (current) drug therapy: Secondary | ICD-10-CM | POA: Diagnosis not present

## 2015-06-28 DIAGNOSIS — G8918 Other acute postprocedural pain: Secondary | ICD-10-CM | POA: Insufficient documentation

## 2015-06-28 DIAGNOSIS — Z859 Personal history of malignant neoplasm, unspecified: Secondary | ICD-10-CM | POA: Diagnosis not present

## 2015-06-28 DIAGNOSIS — Z9889 Other specified postprocedural states: Secondary | ICD-10-CM | POA: Diagnosis not present

## 2015-06-28 DIAGNOSIS — Z8781 Personal history of (healed) traumatic fracture: Secondary | ICD-10-CM | POA: Insufficient documentation

## 2015-06-28 DIAGNOSIS — Z720 Tobacco use: Secondary | ICD-10-CM | POA: Insufficient documentation

## 2015-06-28 DIAGNOSIS — Z8673 Personal history of transient ischemic attack (TIA), and cerebral infarction without residual deficits: Secondary | ICD-10-CM | POA: Insufficient documentation

## 2015-06-28 DIAGNOSIS — I10 Essential (primary) hypertension: Secondary | ICD-10-CM | POA: Insufficient documentation

## 2015-06-28 DIAGNOSIS — E119 Type 2 diabetes mellitus without complications: Secondary | ICD-10-CM | POA: Insufficient documentation

## 2015-06-28 DIAGNOSIS — F329 Major depressive disorder, single episode, unspecified: Secondary | ICD-10-CM | POA: Diagnosis not present

## 2015-06-28 DIAGNOSIS — F419 Anxiety disorder, unspecified: Secondary | ICD-10-CM | POA: Insufficient documentation

## 2015-06-28 DIAGNOSIS — E039 Hypothyroidism, unspecified: Secondary | ICD-10-CM | POA: Diagnosis not present

## 2015-06-28 DIAGNOSIS — Z85118 Personal history of other malignant neoplasm of bronchus and lung: Secondary | ICD-10-CM | POA: Insufficient documentation

## 2015-06-28 DIAGNOSIS — Z8701 Personal history of pneumonia (recurrent): Secondary | ICD-10-CM | POA: Insufficient documentation

## 2015-06-28 DIAGNOSIS — Z8719 Personal history of other diseases of the digestive system: Secondary | ICD-10-CM | POA: Diagnosis not present

## 2015-06-28 DIAGNOSIS — M25511 Pain in right shoulder: Secondary | ICD-10-CM

## 2015-06-28 MED ORDER — ALPRAZOLAM 0.5 MG PO TABS
1.0000 mg | ORAL_TABLET | Freq: Once | ORAL | Status: AC
  Administered 2015-06-28: 1 mg via ORAL
  Filled 2015-06-28: qty 2

## 2015-06-28 MED ORDER — OXYCODONE-ACETAMINOPHEN 5-325 MG PO TABS
2.0000 | ORAL_TABLET | Freq: Once | ORAL | Status: AC
  Administered 2015-06-28: 2 via ORAL
  Filled 2015-06-28: qty 2

## 2015-06-28 MED ORDER — ALPRAZOLAM 0.5 MG PO TABS
0.5000 mg | ORAL_TABLET | Freq: Once | ORAL | Status: AC
  Administered 2015-06-28: 0.5 mg via ORAL
  Filled 2015-06-28: qty 1

## 2015-06-28 NOTE — ED Notes (Signed)
Pt states that she had surgery on her right shoulder and fell shortly after.  States shoulder has bled twice since the fall.  States that she just woke up this morning with blood on her.

## 2015-06-28 NOTE — ED Notes (Signed)
Spoke with nursing home regarding dressing changes.  Nursing home stated that they could follow what was on d/c papers.  MD made aware.

## 2015-06-28 NOTE — ED Notes (Signed)
Waiting on ride back to [place].

## 2015-06-28 NOTE — ED Provider Notes (Signed)
CSN: [id number]     Arrival date & time 06/28/15  5361 History  This chart was scribed for Nat Christen, MD by Terressa Koyanagi, ED Scribe. This patient was seen in room APA04/APA04 and the patient's care was started at 8:35 AM.   Chief Complaint  Patient presents with  . Post-op Problem    The history is provided by the patient. No language interpreter was used.    PCP: [name], MD Surgeon: Dr. Tamera Punt HPI Comments: [name] is a 69 y.o. female, a resident of [facility], with PMH noted below including shoulder surgery 6 days ago whereby hardware was placed on pt's right shoulder to correct a fracture, who presents to the Emergency Department complaining of recurrent bleeding from a surgical incision on her right shoulder onset this morning. Pt reports her first episode of identical Sx occurred on 06/24/15 after she fell; pt was treated at the ED for the same. Pt has not followed up with her orthopedic surgeon yet, however, has a f/u appointment in one week.   Past Medical History  Diagnosis Date  . H/O: stroke 2012, 2000,     X3  . Anxiety   . PTSD (post-traumatic stress disorder)   . Depression   . SVD (spontaneous vaginal delivery)     x 1  . Smoker   . Osteoarthritis     knees, hips hands,   . Shortness of breath   . Pain     SEVERE PAIN BACK, RIGHT HIP AND KNEES--HARRINGTON RODS AND CERVICAL PLATES--USES WALKER OR CANE WHEN AMBULATING - GOES TO A PAIN CLINIC-STATES SHE NEEDS RT HIP AND BOTH KNEES REPLACED. PT STATES SHE WAS TOLD THE CEMENT AROUND THE HARRINGTON RODS IS CRACKED.  [name] Hot flashes, menopausal     SEVERE  . Complication of anesthesia 03/02/13    severe headache,vertigo postop  . COPD (chronic obstructive pulmonary disease)     stable  . 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 Lung cancer   . Status post radiation therapy within four to twelve weeks 10/18/13-12/06/13    lung ca  66Gy  . Family history of anesthesia complication     equipment  failure caused problem, suffered a stroke & heartattack  . Diabetes mellitus, type II   . Diabetes mellitus without complication   . Cancer   . Hypertension   . COPD (chronic obstructive pulmonary disease)   . Squamous cell lung cancer 10/02/2013  . Pneumonia   . Hypothyroidism   . History of stomach ulcers   . History of hiatal hernia   . PONV (postoperative nausea and vomiting)    Past Surgical History  Procedure Laterality Date  . Carpal tunnel release      BILATERAL  . Nasal sinus surgery    . Lumbar fusion      L4-S1  . Cervical fusion    . Hernia repair    . Salpingoophorectomy Right   . Vaginal hysterectomy      TAH w/ ovary removal  . Transthoracic echocardiogram  12-03-2011    LVSF NORMAL/ EF 60-65%  . Vulvectomy N/A 03/02/2013    Procedure: WIDE LOCAL EXCISION VULVAR;  Surgeon: Imagene Gurney A. Alycia Rossetti, MD;  Location: [facility];  Service: Gynecology;  Laterality: N/A;  . Co2 laser application N/A 4/43/1540    Procedure: LASER APPLICATION OF THE VULVA;  Surgeon: Janie Morning, MD;  Location: [facility];  Service: Gynecology;  Laterality: N/A;  . Video bronchoscopy  Bilateral 09/23/2013    Procedure: VIDEO BRONCHOSCOPY WITHOUT FLUORO;  Surgeon: Tanda Rockers, MD;  Location: [facility];  Service: Cardiopulmonary;  Laterality: Bilateral;  . Hemiarthroplasty shoulder fracture Left 05/25/2014    DR CHANDLER  . Reverse shoulder arthroplasty Left 05/24/2014    Procedure:   Hemi-Arthroplasty  Left Shoulder;  Surgeon: Nita Sells, MD;  Location: [facility];  Service: Orthopedics;  Laterality: Left;  . Orif humerus fracture Right 12/12/2014    Procedure: OPEN REDUCTION INTERNAL FIXATION (ORIF) PROXIMAL HUMERUS FRACTURE;  Surgeon: Nita Sells, MD;  Location: [facility];  Service: Orthopedics;  Laterality: Right;  . Tonsillectomy    . Hardware removal Right 06/22/2015    Procedure: HARDWARE REMOVAL GLENOHUMERAL SUSPENSION;  Surgeon: Tania Ade, MD;  Location:  [facility];  Service: Orthopedics;  Laterality: Right;  Deep hardware removal of right shoulder   Family History  Problem Relation Age of Onset  . Cancer Mother     COLON  . Depression Mother   . Heart disease Father   . Diabetes Maternal Aunt   . Cancer Maternal Grandfather     KIDNEY   . Diabetes Maternal Grandfather   . Hypertension Maternal Grandfather   . Heart disease Maternal Grandfather   . Drug abuse Sister    History  Substance Use Topics  . Smoking status: Current Every Day Smoker -- 0.25 packs/day for 50 years    Types: Cigarettes  . Smokeless tobacco: Never Used  . Alcohol Use: No   OB History    Gravida Para Term Preterm AB TAB SAB Ectopic Multiple Living   2 1 0 0 1 0 0 0       Review of Systems  Constitutional: Negative for fever and chills.  Musculoskeletal:       Discharge from right shoulder. Right shoulder pain at baseline.   Psychiatric/Behavioral: Negative for confusion.    A complete 10 system review of systems was obtained and all systems are negative except as noted in the HPI and PMH.    Allergies  Carafate; Prednisone; Nsaids; Aspirin; Aspirin; Ibuprofen; Nsaids; and Dulera  Home Medications   Prior to Admission medications   Medication Sig Start Date End Date Taking? Authorizing Provider  alprazolam Duanne Moron) 2 MG tablet Take 1 tablet (2 mg total) by mouth 3 (three) times daily. 05/31/15  Yes Cloria Spring, MD  ANORO ELLIPTA 62.5-25 MCG/INH AEPB Inhale 1 puff into the lungs daily. 03/17/15  Yes Historical Provider, MD  atenolol (TENORMIN) 50 MG tablet Take 1 tablet (50 mg total) by mouth daily. 03/15/15  Yes Estela Leonie Green, MD  chlorzoxazone (PARAFON) 500 MG tablet Take 1 tablet (500 mg total) by mouth 4 (four) times daily. 03/15/15  Yes Estela Leonie Green, MD  esomeprazole (NEXIUM) 40 MG capsule Take 1 capsule (40 mg total) by mouth daily. 03/15/15  Yes Estela Leonie Green, MD  guaiFENesin (MUCINEX) 600 MG 12 hr tablet Take  1,200 mg by mouth 2 (two) times daily.   Yes Historical Provider, MD  insulin aspart (NOVOLOG FLEXPEN) 100 UNIT/ML FlexPen Inject 5 Units into the skin 3 (three) times daily with meals. Patient taking differently: Inject 4-11 Units into the skin 3 (three) times daily with meals. 4 units before breakfast if b/s is 70 or greater,9 units before lunch and supper(if patient is eating) or if b/s 70 or greater,11 units for b/s greater than 200 03/15/15  Yes Estela Leonie Green, MD  Insulin Glargine (LANTUS SOLOSTAR)  100 UNIT/ML Solostar Pen Inject 20 Units into the skin daily at 10 pm. Patient taking differently: Inject 21 Units into the skin daily at 10 pm.  03/15/15  Yes Estela Leonie Green, MD  levothyroxine (SYNTHROID) 125 MCG tablet Take 1 tablet (125 mcg total) by mouth daily before breakfast. 06/20/15  Yes Baird Cancer, PA-C  lisinopril (PRINIVIL,ZESTRIL) 5 MG tablet Take 1 tablet (5 mg total) by mouth daily. 03/15/15  Yes Estela Leonie Green, MD  MOVANTIK 25 MG TABS tablet Take 25 mg by mouth daily. 05/19/15  Yes Historical Provider, MD  sertraline (ZOLOFT) 100 MG tablet Take 2 tablets (200 mg total) by mouth daily. 05/31/15  Yes Cloria Spring, MD  traZODone (DESYREL) 150 MG tablet Take 2 tablets (300 mg total) by mouth at bedtime. 05/31/15  Yes Cloria Spring, MD  albuterol (PROVENTIL HFA;VENTOLIN HFA) 108 (90 BASE) MCG/ACT inhaler 2 puffs every 4 - 6 hours as needed for dyspnea Patient not taking: Reported on 06/15/2015 04/07/15   Patrici Ranks, MD  albuterol (PROVENTIL) (2.5 MG/3ML) 0.083% nebulizer solution Take 2.5 mg by nebulization every 6 (six) hours as needed for wheezing or shortness of breath.    Historical Provider, MD  fentaNYL (DURAGESIC - DOSED MCG/HR) 25 MCG/HR patch Place 1 patch (25 mcg total) onto the skin every 3 (three) days. 06/06/15   Baird Cancer, PA-C  fluticasone (FLONASE) 50 MCG/ACT nasal spray Place 1 spray into both nostrils daily as needed for allergies  or rhinitis. 03/15/15   Erline Hau, MD  Insulin Pen Needle (NOVOFINE) 30G X 8 MM MISC Inject 1 packet into the skin as needed.    Historical Provider, MD  lidocaine-prilocaine (EMLA) cream Apply a quarter size amount to port site 1 hour prior to chemo. Do not rub in. Cover with plastic wrap. 06/20/15   Baird Cancer, PA-C  ondansetron (ZOFRAN) 8 MG tablet Take 1 tablet (8 mg total) by mouth every 8 (eight) hours as needed for nausea or vomiting. 04/07/15   Patrici Ranks, MD  oxyCODONE-acetaminophen (PERCOCET/ROXICET) 5-325 MG per tablet Take 1-2 tablets by mouth every 4 (four) hours as needed for moderate pain or severe pain. 06/23/15   Grier Mitts, PA-C  spironolactone (ALDACTONE) 50 MG tablet Take 2 tablets (100 mg total) by mouth daily. Patient not taking: Reported on 05/31/2015 05/23/15   Manon Hilding Kefalas, PA-C  tiotropium (SPIRIVA) 18 MCG inhalation capsule Place 1 capsule (18 mcg total) into inhaler and inhale daily. Patient not taking: Reported on 05/23/2015 03/15/15   Erline Hau, MD   Triage Vitals: BP 159/77 mmHg  Pulse 75  Temp(Src) 97.6 F (36.4 C) (Oral)  Resp 18  Ht '5\' 6"'$  (1.676 m)  Wt 150 lb (68.04 kg)  BMI 24.22 kg/m2  SpO2 90% Physical Exam  Constitutional: She is oriented to person, place, and time. She appears well-developed and well-nourished.  HENT:  Head: Normocephalic and atraumatic.  Eyes: Conjunctivae and EOM are normal. Pupils are equal, round, and reactive to light.  Neck: Normal range of motion. Neck supple.  Cardiovascular: Normal rate and regular rhythm.   Pulmonary/Chest: Effort normal and breath sounds normal.  Abdominal: Soft. Bowel sounds are normal.  Musculoskeletal: Normal range of motion. She exhibits tenderness (minimal right shoulder ).  serosanguinous drainage from right shoulder with no puss.   Right shoulder: 6cm oblique suture line on the proximal medial aspect of the shoulder with terry strips are in place.  Neurological: She is alert and oriented to person, place, and time.  Skin: Skin is warm and dry.  Psychiatric: She has a normal mood and affect. Her behavior is normal.  Nursing note and vitals reviewed.   ED Course  Procedures (including critical care time) DIAGNOSTIC STUDIES: Oxygen Saturation is 90% on RA, adequate by my interpretation.    COORDINATION OF CARE: 8:40 AM-Discussed treatment plan which includes consult with surgeon and imaging with pt at bedside and pt agreed to plan.   Labs Review Labs Reviewed - No data to display  Imaging Review Dg Shoulder Right  06/28/2015   CLINICAL DATA:  Right shoulder pain and drainage. History of removal of fracture fixation hardware is 06/22/2015.  EXAM: RIGHT SHOULDER - 2+ VIEW  COMPARISON:  Intraoperative views of the right shoulder 12/12/2014. CT right shoulder 12/11/2014.  FINDINGS: Plate and screws in the proximal right humerus and the humeral head have been removed. 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 No acute fracture is identified. Acromioclavicular degenerative change is noted. No soft tissue gas collection is seen. The patient has a small to moderate appearing right pleural effusion with basilar airspace disease.  IMPRESSION: Status post removal of fracture fixation hardware and the head of the right humerus. No acute abnormality is identified with heterotopic ossification about the surgical site noted.  Acromioclavicular degenerative disease.  Right pleural effusion and basilar airspace disease.   Electronically Signed   By: Inge Rise M.D.   On: 06/28/2015 09:25     EKG Interpretation None      MDM   Final diagnoses:  Right shoulder pain    Right shoulder wound examined. Serosanguineous drainage only. No pus. No fever. Right shoulder x-ray shows no acute abnormality. Discussed with physician assistant working with Dr. Tamera Punt. They will follow-up PRN in the office. Dressing changes only at this  time.  I personally performed the services described in this documentation, which was scribed in my presence. The recorded information has been reviewed and is accurate.    Nat Christen, MD 06/28/15 1316

## 2015-06-28 NOTE — ED Notes (Signed)
Patient with no complaints at this time. Respirations even and unlabored. Skin warm/dry. Discharge instructions reviewed with patient at this time. Patient given opportunity to voice concerns/ask questions. Patient discharged at this time and left Emergency Department via Wheelchair with caregiver from [facility]

## 2015-06-28 NOTE — Discharge Instructions (Signed)
Patient has excessive serosanguineous drainage from right shoulder wound. Recommend frequent dressing changes only at this time. I discussed this with her orthopedic surgeon in [place]. Follow-up with normal visit scheduled for orthopedic doctor. She will need earlier follow-up for fever, pus, any concerns

## 2015-06-28 NOTE — ED Notes (Signed)
Pt stated that she either needed a smoke or the remainder of the xanax that she usually takes.  Pt states that she normally takes 2 mg.  MD made aware and additional '1mg'$  ordered.

## 2015-06-28 NOTE — ED Notes (Signed)
Pt up to bathroom via WC.  Denies any complaints at this time.

## 2015-06-29 ENCOUNTER — Other Ambulatory Visit (HOSPITAL_COMMUNITY): Payer: Self-pay | Admitting: *Deleted

## 2015-06-29 ENCOUNTER — Other Ambulatory Visit (HOSPITAL_COMMUNITY): Payer: Self-pay | Admitting: Oncology

## 2015-06-29 DIAGNOSIS — C3492 Malignant neoplasm of unspecified part of left bronchus or lung: Secondary | ICD-10-CM

## 2015-06-29 MED ORDER — OXYCODONE-ACETAMINOPHEN 5-325 MG PO TABS: 1.0000 | ORAL_TABLET | ORAL | Status: DC | PRN

## 2015-07-03 NOTE — Progress Notes (Signed)
This note was created in error. Please disregard.

## 2015-07-05 ENCOUNTER — Encounter (HOSPITAL_COMMUNITY): Payer: Medicare Other | Attending: Oncology | Admitting: Hematology & Oncology

## 2015-07-05 ENCOUNTER — Encounter (HOSPITAL_BASED_OUTPATIENT_CLINIC_OR_DEPARTMENT_OTHER): Payer: Medicare Other

## 2015-07-05 VITALS — BP 134/67 | HR 68 | Temp 99.0°F | Resp 18 | Wt 154.0 lb

## 2015-07-05 VITALS — BP 125/64 | HR 67 | Temp 98.7°F | Resp 16

## 2015-07-05 DIAGNOSIS — C3411 Malignant neoplasm of upper lobe, right bronchus or lung: Secondary | ICD-10-CM | POA: Diagnosis present

## 2015-07-05 DIAGNOSIS — K5900 Constipation, unspecified: Secondary | ICD-10-CM

## 2015-07-05 DIAGNOSIS — R918 Other nonspecific abnormal finding of lung field: Secondary | ICD-10-CM

## 2015-07-05 DIAGNOSIS — E032 Hypothyroidism due to medicaments and other exogenous substances: Secondary | ICD-10-CM | POA: Insufficient documentation

## 2015-07-05 DIAGNOSIS — Z5112 Encounter for antineoplastic immunotherapy: Secondary | ICD-10-CM

## 2015-07-05 DIAGNOSIS — C3431 Malignant neoplasm of lower lobe, right bronchus or lung: Secondary | ICD-10-CM | POA: Diagnosis present

## 2015-07-05 DIAGNOSIS — R042 Hemoptysis: Secondary | ICD-10-CM | POA: Insufficient documentation

## 2015-07-05 DIAGNOSIS — C3490 Malignant neoplasm of unspecified part of unspecified bronchus or lung: Secondary | ICD-10-CM

## 2015-07-05 DIAGNOSIS — Z720 Tobacco use: Secondary | ICD-10-CM | POA: Diagnosis not present

## 2015-07-05 DIAGNOSIS — E039 Hypothyroidism, unspecified: Secondary | ICD-10-CM | POA: Diagnosis not present

## 2015-07-05 DIAGNOSIS — C3492 Malignant neoplasm of unspecified part of left bronchus or lung: Secondary | ICD-10-CM

## 2015-07-05 LAB — CBC WITH DIFFERENTIAL/PLATELET
Basophils Absolute: 0 10*3/uL (ref 0.0–0.1)
Basophils Relative: 1 % (ref 0–1)
EOS ABS: 0.1 10*3/uL (ref 0.0–0.7)
Eosinophils Relative: 2 % (ref 0–5)
HEMATOCRIT: 35.2 % — AB (ref 36.0–46.0)
HEMOGLOBIN: 10.6 g/dL — AB (ref 12.0–15.0)
LYMPHS ABS: 0.9 10*3/uL (ref 0.7–4.0)
LYMPHS PCT: 13 % (ref 12–46)
MCH: 27.8 pg (ref 26.0–34.0)
MCHC: 30.1 g/dL (ref 30.0–36.0)
MCV: 92.4 fL (ref 78.0–100.0)
Monocytes Absolute: 0.3 10*3/uL (ref 0.1–1.0)
Monocytes Relative: 4 % (ref 3–12)
NEUTROS ABS: 5.4 10*3/uL (ref 1.7–7.7)
Neutrophils Relative %: 80 % — ABNORMAL HIGH (ref 43–77)
Platelets: 234 10*3/uL (ref 150–400)
RBC: 3.81 MIL/uL — ABNORMAL LOW (ref 3.87–5.11)
RDW: 15 % (ref 11.5–15.5)
WBC: 6.6 10*3/uL (ref 4.0–10.5)

## 2015-07-05 LAB — COMPREHENSIVE METABOLIC PANEL
ALT: 7 U/L — AB (ref 14–54)
AST: 13 U/L — ABNORMAL LOW (ref 15–41)
Albumin: 3.1 g/dL — ABNORMAL LOW (ref 3.5–5.0)
Alkaline Phosphatase: 103 U/L (ref 38–126)
Anion gap: 7 (ref 5–15)
BUN: 6 mg/dL (ref 6–20)
CALCIUM: 7.9 mg/dL — AB (ref 8.9–10.3)
CO2: 33 mmol/L — AB (ref 22–32)
CREATININE: 0.7 mg/dL (ref 0.44–1.00)
Chloride: 93 mmol/L — ABNORMAL LOW (ref 101–111)
GLUCOSE: 220 mg/dL — AB (ref 65–99)
Potassium: 4.2 mmol/L (ref 3.5–5.1)
Sodium: 133 mmol/L — ABNORMAL LOW (ref 135–145)
Total Bilirubin: 0.4 mg/dL (ref 0.3–1.2)
Total Protein: 6.2 g/dL — ABNORMAL LOW (ref 6.5–8.1)

## 2015-07-05 MED ORDER — NIVOLUMAB CHEMO INJECTION 100 MG/10ML
3.0000 mg/kg | Freq: Once | INTRAVENOUS | Status: AC
  Administered 2015-07-05: 220 mg via INTRAVENOUS
  Filled 2015-07-05: qty 18

## 2015-07-05 MED ORDER — SODIUM CHLORIDE 0.9 % IV SOLN
Freq: Once | INTRAVENOUS | Status: AC
  Administered 2015-07-05: 12:00:00 via INTRAVENOUS

## 2015-07-05 MED ORDER — HEPARIN SOD (PORK) LOCK FLUSH 100 UNIT/ML IV SOLN
500.0000 [IU] | Freq: Once | INTRAVENOUS | Status: AC | PRN
  Administered 2015-07-05: 500 [IU]

## 2015-07-05 MED ORDER — SODIUM CHLORIDE 0.9 % IJ SOLN
10.0000 mL | INTRAMUSCULAR | Status: DC | PRN
  Administered 2015-07-05: 10 mL
  Filled 2015-07-05: qty 10

## 2015-07-05 MED ORDER — HEPARIN SOD (PORK) LOCK FLUSH 100 UNIT/ML IV SOLN
INTRAVENOUS | Status: AC
  Filled 2015-07-05: qty 5

## 2015-07-05 NOTE — Progress Notes (Signed)
Tolerated Opdivo infusion well.

## 2015-07-05 NOTE — Patient Instructions (Signed)
[facility] Discharge Instructions for Patients Receiving Chemotherapy  Today you received the following chemotherapy agents Opdivo. To help prevent nausea and vomiting after your treatment, we encourage you to take your nausea medication as instructed. If you develop nausea and vomiting that is not controlled by your nausea medication, call the clinic. If it is after clinic hours your family physician or the after hours number for the clinic or go to the Emergency Department.  BELOW ARE SYMPTOMS THAT SHOULD BE REPORTED IMMEDIATELY:  *FEVER GREATER THAN 101.0 F  *CHILLS WITH OR WITHOUT FEVER  NAUSEA AND VOMITING THAT IS NOT CONTROLLED WITH YOUR NAUSEA MEDICATION  *UNUSUAL SHORTNESS OF BREATH  *UNUSUAL BRUISING OR BLEEDING  TENDERNESS IN MOUTH AND THROAT WITH OR WITHOUT PRESENCE OF ULCERS  *URINARY PROBLEMS  *BOWEL PROBLEMS  UNUSUAL RASH Items with * indicate a potential emergency and should be followed up as soon as possible.  Return as scheduled.  I have been informed and understand all the instructions given to me. I know to contact the clinic, my physician, or go to the Emergency Department if any problems should occur. I do not have any questions at this time, but understand that I may call the clinic during office hours or the Patient Navigator at [phone] should I have any questions or need assistance in obtaining follow up care.    __________________________________________  _____________  __________ Signature of Patient or Authorized Representative            Date                   Time    __________________________________________ Nurse's Signature  

## 2015-07-05 NOTE — Patient Instructions (Signed)
[place] at [facility] Discharge Instructions  RECOMMENDATIONS MADE BY THE CONSULTANT AND ANY TEST RESULTS WILL BE SENT TO YOUR REFERRING PHYSICIAN.  Opdivo infusion today. CT scans as scheduled on 07/12/15. Return in two weeks for office visit with Dr. Whitney Muse and Opdivo infusion.  Thank you for choosing [place] at [facility] to provide your oncology and hematology care.  To afford each patient quality time with our provider, please arrive at least 15 minutes before your scheduled appointment time.    You need to re-schedule your appointment should you arrive 10 or more minutes late.  We strive to give you quality time with our providers, and arriving late affects you and other patients whose appointments are after yours.  Also, if you no show three or more times for appointments you may be dismissed from the clinic at the providers discretion.     Again, thank you for choosing [facility].  Our hope is that these requests will decrease the amount of time that you wait before being seen by our physicians.       _____________________________________________________________  Should you have questions after your visit to [facility], please contact our office at [phone] between the hours of 8:30 a.m. and 4:30 p.m.  Voicemails left after 4:30 p.m. will not be returned until the following business day.  For prescription refill requests, have your pharmacy contact our office.

## 2015-07-05 NOTE — Progress Notes (Signed)
[facility] OFFICE PROGRESS NOTE  [name], [place] [address]  DIAGNOSIS: Metastatic non-small cell lung cancer initially diagnosed as Locally advanced, likely stage IIIA (T2a., N2, M0) non-small cell lung cancer, squamous cell carcinoma diagnosed in September of 2014.   PRIOR THERAPY:  1) Concurrent chemoradiation with weekly carboplatin for AUC of 2 and paclitaxel 45 mg/M2, status post 7 cycles, last cycle was given 11/29/2013 with partial response. First dose was given on 10/18/2013.   2) Systemic chemotherapy with carboplatin for AUC of 5 and paclitaxel 175 mg/M2 every 3 weeks with Neulasta support. First cycle on 04/06/2014. Status post 2 cycles.  CURRENT THERAPY: Immunotherapy with Nivolumab 3 mg/KG every 2 weeks. First cycle 09/27/2014. Last cycle given on 12/09/2014 Patient restarted therapy on 04/25/2015, she was unwilling to take different therapy  INTERVAL HISTORY: [name] 69 y.o. female returns to the clinic today for follow up visit of stage IV squamous cell carcinoma of the lung. She was diagnosed with earlier stage disease back in 2014. She was treated in [place]. She has had multiple hospital admissions beginning late last year. She has chronic pain and reportedly issues with pain medications in the past. She is currently residing in a local nursing home. She was admitted to [facility] twice in March. Once for DKA and a second time for presumed benzodiazepine withdrawal seizure.  The patient preesents alone today and is here for treatment.  She is in a wheelchair and says that she only walks in her room at the NH where she has something to hold onto.  She does not trust herself with a walker due to her right arm and left shoulder. She states that her arm is getting better however.  She complains of everyday constipation.  She currently is taking 2 Colace stool softeners at bedtime. She has asked that I write a new regimen for her  to take. She visited [place] last week.  She was prescribed antibiotics.   The patient takes her thyroid medication daily, 125 mcg Levthyroxine She has not been smoking as much She is essentially without major complaints. No pain complaints today.  MEDICAL HISTORY: Past Medical History  Diagnosis Date  . H/O: stroke 2012, 2000,     X3  . Anxiety   . PTSD (post-traumatic stress disorder)   . Depression   . SVD (spontaneous vaginal delivery)     x 1  . Smoker   . Osteoarthritis     knees, hips hands,   . Shortness of breath   . Pain     SEVERE PAIN BACK, RIGHT HIP AND KNEES--HARRINGTON RODS AND CERVICAL PLATES--USES WALKER OR CANE WHEN AMBULATING - GOES TO A PAIN CLINIC-STATES SHE NEEDS RT HIP AND BOTH KNEES REPLACED. PT STATES SHE WAS TOLD THE CEMENT AROUND THE HARRINGTON RODS IS CRACKED.  [name] Hot flashes, menopausal     SEVERE  . Complication of anesthesia 03/02/13    severe headache,vertigo postop  . COPD (chronic obstructive pulmonary disease)     stable  . 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 Lung cancer   . Status post radiation therapy within four to twelve weeks 10/18/13-12/06/13    lung ca  66Gy  . Family history of anesthesia complication     equipment failure caused problem, suffered a stroke & heartattack  . Diabetes mellitus, type II   . Diabetes mellitus without complication   . Cancer   .  Hypertension   . COPD (chronic obstructive pulmonary disease)   . Squamous cell lung cancer 10/02/2013  . Pneumonia   . Hypothyroidism   . History of stomach ulcers   . History of hiatal hernia   . PONV (postoperative nausea and vomiting)     ALLERGIES:  is allergic to carafate; prednisone; nsaids; aspirin; aspirin; ibuprofen; nsaids; and dulera.  MEDICATIONS:  Current Outpatient Prescriptions  Medication Sig Dispense Refill  . alprazolam (XANAX) 2 MG tablet Take 1 tablet (2 mg total) by mouth 3 (three) times daily. 90 tablet 2  . ANORO ELLIPTA 62.5-25 MCG/INH AEPB  Inhale 1 puff into the lungs daily.    [name] atenolol (TENORMIN) 50 MG tablet Take 1 tablet (50 mg total) by mouth daily. 30 tablet 0  . chlorzoxazone (PARAFON) 500 MG tablet Take 1 tablet (500 mg total) by mouth 4 (four) times daily. 120 tablet 0  . esomeprazole (NEXIUM) 40 MG capsule Take 1 capsule (40 mg total) by mouth daily. 30 capsule 0  . fentaNYL (DURAGESIC - DOSED MCG/HR) 25 MCG/HR patch Place 1 patch (25 mcg total) onto the skin every 3 (three) days. 10 patch 0  . fluticasone (FLONASE) 50 MCG/ACT nasal spray Place 1 spray into both nostrils daily as needed for allergies or rhinitis. 16 g 0  . guaiFENesin (MUCINEX) 600 MG 12 hr tablet Take 1,200 mg by mouth 2 (two) times daily.    . insulin aspart (NOVOLOG FLEXPEN) 100 UNIT/ML FlexPen Inject 5 Units into the skin 3 (three) times daily with meals. (Patient taking differently: Inject 4-11 Units into the skin 3 (three) times daily with meals. 4 units before breakfast if b/s is 70 or greater,9 units before lunch and supper(if patient is eating) or if b/s 70 or greater,11 units for b/s greater than 200) 15 mL 11  . Insulin Glargine (LANTUS SOLOSTAR) 100 UNIT/ML Solostar Pen Inject 20 Units into the skin daily at 10 pm. (Patient taking differently: Inject 21 Units into the skin daily at 10 pm. ) 15 mL 11  . Insulin Pen Needle (NOVOFINE) 30G X 8 MM MISC Inject 1 packet into the skin as needed.    [name] levothyroxine (SYNTHROID) 125 MCG tablet Take 1 tablet (125 mcg total) by mouth daily before breakfast. 30 tablet 1  . lidocaine-prilocaine (EMLA) cream Apply a quarter size amount to port site 1 hour prior to chemo. Do not rub in. Cover with plastic wrap. 30 g 3  . lisinopril (PRINIVIL,ZESTRIL) 5 MG tablet Take 1 tablet (5 mg total) by mouth daily. 30 tablet 0  . MOVANTIK 25 MG TABS tablet Take 25 mg by mouth daily.    . ondansetron (ZOFRAN) 8 MG tablet Take 1 tablet (8 mg total) by mouth every 8 (eight) hours as needed for nausea or vomiting. 30 tablet 2  .  oxyCODONE-acetaminophen (PERCOCET/ROXICET) 5-325 MG per tablet Take 1 tablet by mouth every 4 (four) hours as needed for moderate pain or severe pain. (Patient taking differently: Take 2 tablets by mouth every 4 (four) hours as needed for moderate pain or severe pain. ) 40 tablet 0  . sertraline (ZOLOFT) 100 MG tablet Take 2 tablets (200 mg total) by mouth daily. 60 tablet 2  . spironolactone (ALDACTONE) 50 MG tablet Take 2 tablets (100 mg total) by mouth daily. 10 tablet 0  . tiotropium (SPIRIVA) 18 MCG inhalation capsule Place 1 capsule (18 mcg total) into inhaler and inhale daily. 30 capsule 12  . traZODone (DESYREL) 150 MG tablet  Take 2 tablets (300 mg total) by mouth at bedtime. 60 tablet 2  . albuterol (PROVENTIL HFA;VENTOLIN HFA) 108 (90 BASE) MCG/ACT inhaler 2 puffs every 4 - 6 hours as needed for dyspnea (Patient not taking: Reported on 06/15/2015) 1 Inhaler 2  . albuterol (PROVENTIL) (2.5 MG/3ML) 0.083% nebulizer solution Take 2.5 mg by nebulization every 6 (six) hours as needed for wheezing or shortness of breath.     No current facility-administered medications for this visit.   Facility-Administered Medications Ordered in Other Visits  Medication Dose Route Frequency Provider Last Rate Last Dose  . sodium chloride 0.9 % injection 10 mL  10 mL Intracatheter PRN Patrici Ranks, MD   10 mL at 05/09/15 1415    SURGICAL HISTORY:  Past Surgical History  Procedure Laterality Date  . Carpal tunnel release      BILATERAL  . Nasal sinus surgery    . Lumbar fusion      L4-S1  . Cervical fusion    . Hernia repair    . Salpingoophorectomy Right   . Vaginal hysterectomy      TAH w/ ovary removal  . Transthoracic echocardiogram  12-03-2011    LVSF NORMAL/ EF 60-65%  . Vulvectomy N/A 03/02/2013    Procedure: WIDE LOCAL EXCISION VULVAR;  Surgeon: Imagene Gurney A. Alycia Rossetti, MD;  Location: [facility];  Service: Gynecology;  Laterality: N/A;  . Co2 laser application N/A 9/38/1017    Procedure: LASER  APPLICATION OF THE VULVA;  Surgeon: Janie Morning, MD;  Location: [facility];  Service: Gynecology;  Laterality: N/A;  . Video bronchoscopy Bilateral 09/23/2013    Procedure: VIDEO BRONCHOSCOPY WITHOUT FLUORO;  Surgeon: Tanda Rockers, MD;  Location: [facility];  Service: Cardiopulmonary;  Laterality: Bilateral;  . Hemiarthroplasty shoulder fracture Left 05/25/2014    DR CHANDLER  . Reverse shoulder arthroplasty Left 05/24/2014    Procedure:   Hemi-Arthroplasty  Left Shoulder;  Surgeon: Nita Sells, MD;  Location: [place];  Service: Orthopedics;  Laterality: Left;  . Orif humerus fracture Right 12/12/2014    Procedure: OPEN REDUCTION INTERNAL FIXATION (ORIF) PROXIMAL HUMERUS FRACTURE;  Surgeon: Nita Sells, MD;  Location: [place];  Service: Orthopedics;  Laterality: Right;  . Tonsillectomy    . Hardware removal Right 06/22/2015    Procedure: HARDWARE REMOVAL GLENOHUMERAL SUSPENSION;  Surgeon: Tania Ade, MD;  Location: [place];  Service: Orthopedics;  Laterality: Right;  Deep hardware removal of right shoulder    REVIEW OF SYSTEMS:  Constitutional: positive for fatigue, complains of pain "all over" and in chest Eyes: negative Ears, nose, mouth, throat, and face: negative Respiratory: positive for cough, dyspnea on exertion and sputum Cardiovascular: negative, swelling in her feet Gastrointestinal: occasional nausea Genitourinary:negative Integument/breast: negative Hematologic/lymphatic: negative Musculoskeletal:positive for muscle weakness Neurological: negative Behavioral/Psych: negative Endocrine: negative Allergic/Immunologic: negative  14 point review of systems was performed and is negative except as detailed under history of present illness and above   PHYSICAL EXAMINATION: General appearance: alert, cooperative, fatigued and no distress Sitting in a wheelchair Head: Normocephalic, without obvious abnormality, atraumatic Neck: no  adenopathy, no JVD, supple, symmetrical, trachea midline and thyroid not enlarged, symmetric, no tenderness/mass/nodules Lymph nodes: Cervical, supraclavicular, and axillary nodes normal. Resp: wheezes bilaterally Back: symmetric, no curvature. ROM normal. No CVA tenderness. Cardio: regular rate and rhythm, S1, S2 normal, no murmur, click, rub or gallop GI: soft, non-tender; bowel sounds normal; no masses,  no organomegaly Extremities: extremities normal, atraumatic, no cyanosis or edema Neurologic: Alert  and oriented X 3,   ECOG PERFORMANCE STATUS: 2 - Symptomatic, <50% confined to bed  Blood pressure 134/67, pulse 68, temperature 99 F (37.2 C), temperature source Oral, resp. rate 18, weight 154 lb (69.854 kg), SpO2 92 %.  LABORATORY DATA: Lab Results  Component Value Date   WBC 6.6 07/05/2015   HGB 10.6* 07/05/2015   HCT 35.2* 07/05/2015   MCV 92.4 07/05/2015   PLT 234 07/05/2015      Chemistry      Component Value Date/Time   NA 133* 07/05/2015 1104   NA 134* 12/09/2014 1100   K 4.2 07/05/2015 1104   K 4.2 12/09/2014 1100   CL 93* 07/05/2015 1104   CO2 33* 07/05/2015 1104   CO2 27 12/09/2014 1100   BUN 6 07/05/2015 1104   BUN 8.8 12/09/2014 1100   CREATININE 0.70 07/05/2015 1104   CREATININE 0.9 12/09/2014 1100      Component Value Date/Time   CALCIUM 7.9* 07/05/2015 1104   CALCIUM 8.4 12/09/2014 1100   ALKPHOS 103 07/05/2015 1104   ALKPHOS 138 12/09/2014 1100   AST 13* 07/05/2015 1104   AST 15 12/09/2014 1100   ALT 7* 07/05/2015 1104   ALT 12 12/09/2014 1100   BILITOT 0.4 07/05/2015 1104   BILITOT 0.52 12/09/2014 1100       RADIOGRAPHIC STUDIES: Dg Shoulder Right  06/28/2015   CLINICAL DATA:  Right shoulder pain and drainage. History of removal of fracture fixation hardware is 06/22/2015.  EXAM: RIGHT SHOULDER - 2+ VIEW  COMPARISON:  Intraoperative views of the right shoulder 12/12/2014. CT right shoulder 12/11/2014.  FINDINGS: Plate and screws in the  proximal right humerus and the humeral head have been removed. 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 No acute fracture is identified. Acromioclavicular degenerative change is noted. No soft tissue gas collection is seen. The patient has a small to moderate appearing right pleural effusion with basilar airspace disease.  IMPRESSION: Status post removal of fracture fixation hardware and the head of the right humerus. No acute abnormality is identified with heterotopic ossification about the surgical site noted.  Acromioclavicular degenerative disease.  Right pleural effusion and basilar airspace disease.   Electronically Signed   By: Inge Rise M.D.   On: 06/28/2015 09:25    ASSESSMENT AND PLAN: This is a very pleasant 69 years old white female with:  1) Stage IIIA non-small cell lung cancer completed a course of concurrent chemoradiation with weekly carboplatin and paclitaxel and tolerating her treatment fairly well. She completed 2 more cycles of systemic chemotherapy with carboplatin and paclitaxel every 3 weeks but her treatment was discontinued after the patient had surgery for the left shoulder She had evidence for disease progression and the patient was started on treatment with immunotherapy with Nivolumab 3 MG/KG and tolerating her treatment fairly well. Restaging studies late last year showed stability of disease.   She was unable to get back to [facility] for multiple reasons and now resides in a local nursing home.She restarted Nivolumab in April. She tolerates it extremely well. Clinically she does not manifest evidence of progression but is due for repeat imaging.  We have ordered studies today.  2) immune mediated hypothyroidism:  She has developed immune mediated hypothyroidism and we will continue to monitor her thyroid function moving forward. We will adjust her medication as needed.Synthroid has recently been increased.  3) smoke cessation: I strongly encouraged  the patient to quit smoking.  Living in the NH and her physical  status has essentially caused her to cut back significantly although she continues to smoke  4) CONSTIPATION)  I wrote for senokot S and miralax for the NH. I advised her if her constipation does not improve to let me know at follow-up and I will adjust  All questions were answered. The patient knows to call the clinic with any problems, questions or concerns. We can certainly see the patient much sooner if necessary.   This document serves as a record of services personally performed by Ancil Linsey, MD. It was created on her behalf by Janace Hoard, a trained medical scribe. The creation of this record is based on the scribe's personal observations and the provider's statements to them. This document has been checked and approved by the attending provider.  I have reviewed the above documentation for accuracy and completeness, and I agree with the above.  This note was signed electronically.  Kelby Fam. Whitney Muse, MD

## 2015-07-11 ENCOUNTER — Inpatient Hospital Stay (HOSPITAL_COMMUNITY): Payer: Self-pay

## 2015-07-12 ENCOUNTER — Other Ambulatory Visit (HOSPITAL_COMMUNITY): Payer: Self-pay

## 2015-07-12 ENCOUNTER — Telehealth (HOSPITAL_COMMUNITY): Payer: Self-pay | Admitting: *Deleted

## 2015-07-16 ENCOUNTER — Encounter (HOSPITAL_COMMUNITY): Payer: Self-pay | Admitting: Hematology & Oncology

## 2015-07-17 ENCOUNTER — Other Ambulatory Visit (HOSPITAL_COMMUNITY): Payer: Self-pay | Admitting: Psychiatry

## 2015-07-17 ENCOUNTER — Other Ambulatory Visit (HOSPITAL_COMMUNITY): Payer: Self-pay | Admitting: Hematology & Oncology

## 2015-07-18 NOTE — Progress Notes (Signed)
Gabriela Cahill, MD  [place] [address]  Squamous cell lung cancer, unspecified laterality  CURRENT THERAPY: Immunotherapy with Nivolumab 3 mg/KG every 2 weeks.   INTERVAL HISTORY: [name] 69 y.o. female returns for followup of stage IV squamous cell carcinoma of the lung. She was diagnosed with earlier stage disease back in 2014. She was treated in [place].   The patient had a CT scan this AM for restaging purposes with follow-up and planned treatment based upon the results.  Unfortunately, the patient was 2 hours later for her appointment because the nursing facility took her home after the CT scan and never returned until we called and they quickly brought her in.  I personally reviewed and went over radiographic studies with the patient.  The results are noted within this dictation.  CT scans demonstrate possible progression of disease, but the possibility of a mucous plug.   I have sent a message and referred the patient to CTS for consideration of bronchoscopy to alleviate mucous plug and/or biopsy to evaluate for progressive disease.   She denies any complaints today.  She reports that her breathing is at baseline.  Past Medical History  Diagnosis Date  . H/O: stroke 2012, 2000,     X3  . Anxiety   . PTSD (post-traumatic stress disorder)   . Depression   . SVD (spontaneous vaginal delivery)     x 1  . Smoker   . Osteoarthritis     knees, hips hands,   . Shortness of breath   . Pain     SEVERE PAIN BACK, RIGHT HIP AND KNEES--HARRINGTON RODS AND CERVICAL PLATES--USES WALKER OR CANE WHEN AMBULATING - GOES TO A PAIN CLINIC-STATES SHE NEEDS RT HIP AND BOTH KNEES REPLACED. PT STATES SHE WAS TOLD THE CEMENT AROUND THE HARRINGTON RODS IS CRACKED.  [name] Hot flashes, menopausal     SEVERE  . Complication of anesthesia 03/02/13    severe headache,vertigo postop  . COPD (chronic obstructive pulmonary disease)     stable  . Stroke 2012    residual  tremors  . Lung cancer   . Status post radiation therapy within four to twelve weeks 10/18/13-12/06/13    lung ca  66Gy  . Family history of anesthesia complication     equipment failure caused problem, suffered a stroke & heartattack  . Diabetes mellitus, type II   . Diabetes mellitus without complication   . Cancer   . Hypertension   . COPD (chronic obstructive pulmonary disease)   . Squamous cell lung cancer 10/02/2013  . Pneumonia   . Hypothyroidism   . History of stomach ulcers   . History of hiatal hernia   . PONV (postoperative nausea and vomiting)     has OTHER CLOSED FRACTURE OF TARSAL&METATARSAL BONES; Postmenopausal bleeding; Smoking addiction; HTN (hypertension); Depression; Anxiety; VIN III (vulvar intraepithelial neoplasia III); COPD GOLD III; History of stroke; Essential hypertension, benign; Arthralgia of hip; Hemoptysis; Pneumonia; Chronic pain; Right lower lobe lung mass, Sq cell Ca ? IIIB vs IV; Pneumonitis; Squamous cell lung cancer; Acute encephalopathy; Hospital acquired PNA; Nausea & vomiting; Abdominal pain, other specified site; UTI (lower urinary tract infection); S/P shoulder hemiarthroplasty; Hypothyroidism due to drugs; Fall at home; Fracture, humerus closed; Postoperative anemia due to acute blood loss; DKA, type 2, not at goal; Hyponatremia; Dehydration; Tonic seizure; Drug withdrawal; Hypokalemia; Pain in joint, ankle and foot; DKA (diabetic ketoacidoses); and S/P hardware removal  on her problem list.     is allergic to carafate; prednisone; nsaids; aspirin; aspirin; ibuprofen; nsaids; and dulera.  [name] does not currently have medications on file.  Past Surgical History  Procedure Laterality Date  . Carpal tunnel release      BILATERAL  . Nasal sinus surgery    . Lumbar fusion      L4-S1  . Cervical fusion    . Hernia repair    . Salpingoophorectomy Right   . Vaginal hysterectomy      TAH w/ ovary removal  . Transthoracic echocardiogram   12-03-2011    LVSF NORMAL/ EF 60-65%  . Vulvectomy N/A 03/02/2013    Procedure: WIDE LOCAL EXCISION VULVAR;  Surgeon: Imagene Gurney A. Alycia Rossetti, MD;  Location: [facility];  Service: Gynecology;  Laterality: N/A;  . Co2 laser application N/A 6/71/2458    Procedure: LASER APPLICATION OF THE VULVA;  Surgeon: Janie Morning, MD;  Location: [facility];  Service: Gynecology;  Laterality: N/A;  . Video bronchoscopy Bilateral 09/23/2013    Procedure: VIDEO BRONCHOSCOPY WITHOUT FLUORO;  Surgeon: Tanda Rockers, MD;  Location: [facility];  Service: Cardiopulmonary;  Laterality: Bilateral;  . Hemiarthroplasty shoulder fracture Left 05/25/2014    DR CHANDLER  . Reverse shoulder arthroplasty Left 05/24/2014    Procedure:   Hemi-Arthroplasty  Left Shoulder;  Surgeon: Nita Sells, MD;  Location: [facility];  Service: Orthopedics;  Laterality: Left;  . Orif humerus fracture Right 12/12/2014    Procedure: OPEN REDUCTION INTERNAL FIXATION (ORIF) PROXIMAL HUMERUS FRACTURE;  Surgeon: Nita Sells, MD;  Location: [facility];  Service: Orthopedics;  Laterality: Right;  . Tonsillectomy    . Hardware removal Right 06/22/2015    Procedure: HARDWARE REMOVAL GLENOHUMERAL SUSPENSION;  Surgeon: Tania Ade, MD;  Location: [facility];  Service: Orthopedics;  Laterality: Right;  Deep hardware removal of right shoulder    Denies any headaches, dizziness, double vision, fevers, chills, night sweats, nausea, vomiting, diarrhea, constipation, chest pain, heart palpitations, shortness of breath, blood in stool, black tarry stool, urinary pain, urinary burning, urinary frequency, hematuria.   PHYSICAL EXAMINATION  ECOG PERFORMANCE STATUS: 1 - Symptomatic but completely ambulatory  Filed Vitals:   07/19/15 1300  BP: 139/70  Pulse: 73  Temp: 98.1 F (36.7 C)  Resp: 20    GENERAL:alert, no distress, well nourished, well developed, comfortable, cooperative and smiling SKIN: skin color, texture, turgor are  normal, no rashes or significant lesions HEAD: Normocephalic, No masses, lesions, tenderness or abnormalities EYES: normal, PERRLA, EOMI, Conjunctiva are pink and non-injected EARS: External ears normal OROPHARYNX:lips, buccal mucosa, and tongue normal and mucous membranes are moist  NECK: supple, no adenopathy, thyroid normal size, non-tender, without nodularity, trachea midline LYMPH:  no palpable lymphadenopathy BREAST:not examined LUNGS: decreased breath sounds throughout with further decrease on the right. HEART: regular rate & rhythm, no murmurs and no gallops ABDOMEN:abdomen soft and normal bowel sounds BACK: Back symmetric, no curvature. EXTREMITIES:less then 2 second capillary refill, no joint deformities, effusion, or inflammation, no skin discoloration, no cyanosis  NEURO: alert & oriented x 3 with fluent speech, no focal motor/sensory deficits, gait normal   LABORATORY DATA: CBC    Component Value Date/Time   WBC 6.6 07/05/2015 1104   WBC 5.1 12/09/2014 1100   RBC 3.81* 07/05/2015 1104   RBC 4.12 12/09/2014 1100   HGB 10.6* 07/05/2015 1104   HGB 13.1 12/09/2014 1100   HCT 35.2* 07/05/2015 1104   HCT 40.7 12/09/2014 1100  PLT 234 07/05/2015 1104   PLT 174 12/09/2014 1100   MCV 92.4 07/05/2015 1104   MCV 98.9 12/09/2014 1100   MCH 27.8 07/05/2015 1104   MCH 31.8 12/09/2014 1100   MCHC 30.1 07/05/2015 1104   MCHC 32.2 12/09/2014 1100   RDW 15.0 07/05/2015 1104   RDW 15.5* 12/09/2014 1100   LYMPHSABS 0.9 07/05/2015 1104   LYMPHSABS 0.9 12/09/2014 1100   MONOABS 0.3 07/05/2015 1104   MONOABS 0.3 12/09/2014 1100   EOSABS 0.1 07/05/2015 1104   EOSABS 0.1 12/09/2014 1100   BASOSABS 0.0 07/05/2015 1104   BASOSABS 0.1 12/09/2014 1100      Chemistry      Component Value Date/Time   NA 133* 07/05/2015 1104   NA 134* 12/09/2014 1100   K 4.2 07/05/2015 1104   K 4.2 12/09/2014 1100   CL 93* 07/05/2015 1104   CO2 33* 07/05/2015 1104   CO2 27 12/09/2014 1100    BUN 6 07/05/2015 1104   BUN 8.8 12/09/2014 1100   CREATININE 0.70 07/05/2015 1104   CREATININE 0.9 12/09/2014 1100      Component Value Date/Time   CALCIUM 7.9* 07/05/2015 1104   CALCIUM 8.4 12/09/2014 1100   ALKPHOS 103 07/05/2015 1104   ALKPHOS 138 12/09/2014 1100   AST 13* 07/05/2015 1104   AST 15 12/09/2014 1100   ALT 7* 07/05/2015 1104   ALT 12 12/09/2014 1100   BILITOT 0.4 07/05/2015 1104   BILITOT 0.52 12/09/2014 1100      RADIOLOGY:  CLINICAL DATA: Lung cancer diagnosed 2 years ago. Restaging post chemotherapy and radiation therapy. Subsequent encounter.  EXAM: CT CHEST AND ABDOMEN WITH CONTRAST  TECHNIQUE: Multidetector CT imaging of the chest and abdomen was performed following the standard protocol during bolus administration of intravenous contrast.  CONTRAST: 110m OMNIPAQUE IOHEXOL 300 MG/ML SOLN  COMPARISON: Prior exams 04/13/2015. Right shoulder radiographs 06/28/2015.  FINDINGS: CT CHEST FINDINGS  Mediastinum/Nodes: Interval enlargement of multiple small mediastinal lymph nodes. These include a high right paratracheal node measuring 9 mm on image number 7, and 8 mm right paratracheal node on image 13 and an 11 mm low right paratracheal node on image 23. Right hilar enlargement is grossly stable, largely due to occlusion of the bronchus intermedius and surrounding soft tissue density. Previously described right infrahilar mass is not well delineated due to further collapse of the right middle and lower lobes. The thyroid gland, trachea and esophagus demonstrate no significant findings. There is stable mild cardiomegaly. There is a small pericardial effusion which has enlarged. There is diffuse atherosclerosis of the aorta, great vessels and coronary arteries.  Lungs/Pleura: Enlarging right pleural effusion is now moderate in volume. No definite pleural nodularity. There is a stable small left pleural effusion. As above, there is no  complete collapse of the right middle and lower lobes. There are increased patchy airspace opacities throughout the aerated lungs, superimposed on moderate underlying emphysema. Some of these are nodular in configuration, and superimposed metastatic disease cannot be excluded.  Musculoskeletal/Chest wall: As demonstrated on recent radiographs, there is fragmentation of the proximal right humerus status post removal of the surgical hardware and excision of the humeral head. No glenoid destruction identified. There is a complex right shoulder joint effusion with marked synovial thickening. The left shoulder arthroplasty appears grossly stable. No metastases identified.  CT ABDOMEN FINDINGS  Hepatobiliary: The liver is normal in density without focal abnormality. No evidence of gallstones or gallbladder wall thickening. There is mild extrahepatic biliary dilatation  and a small amount of air is present within the common bile duct.  Pancreas: Stable pancreatic atrophy. No evidence of pancreatic mass, ductal dilatation or surrounding inflammation.  Spleen: Normal in size without focal abnormality.  Adrenals/Urinary Tract: Both adrenal glands appear normal.The kidneys appear normal without evidence of urinary tract calculus or hydronephrosis. Bladder not imaged.  Stomach/Bowel: No evidence of bowel wall thickening, distention or surrounding inflammatory change.The appendix appears normal.  Vascular/Lymphatic: There are no enlarged abdominal lymph nodes. Diffuse aortoiliac atherosclerosis appears grossly stable.  Other: No evidence of abdominal wall mass or hernia.  Musculoskeletal: No acute or significant osseous findings. Stable diffuse degenerative and postsurgical changes within the spine.  IMPRESSION: 1. Progressive collapse of the right middle and lower lobes. The bronchus intermedius is completely occluded. This may be secondary to surrounding tumor and/or  endobronchial tumor/ mucous plugging. 2. Enlarging right pleural effusion without definite nodularity. 3. Progressive patchy airspace opacities within the visualized lungs. There are some nodular components, and metastatic disease cannot be excluded. 4. Some progression in the small mediastinal lymph nodes is noted. This is nonspecific and potentially reactive. 5. No evidence of abdominal metastatic disease.   Electronically Signed  By: Richardean Sale M.D.  On: 07/19/2015 10:23   ASSESSMENT AND PLAN:  Squamous cell lung cancer Stage IV squamous cell carcinoma of the lung. On salvage Nivolumab every 2 weeks with good tolerability.  I have reviewed the patient's CT imaging.  Based upon the results, we will defer treatment today. No pre-chemo labs.  I have recommended a referral to CTS for bronchoscopy secondary to progression of disease versus mucous plugging.    Treatment deferred x 2 weeks for now until results from CT imaging can be sorted out.  Return in 2 weeks for follow-up and next treatment.   THERAPY PLAN:  Continue with therapy as planned in a salvage setting.  All questions were answered. The patient knows to call the clinic with any problems, questions or concerns. We can certainly see the patient much sooner if necessary.  Patient and plan discussed with Dr. Ancil Linsey and she is in agreement with the aforementioned.   This note is electronically signed by: Robynn Pane 07/19/2015 2:33 PM

## 2015-07-18 NOTE — Assessment & Plan Note (Addendum)
Stage IV squamous cell carcinoma of the lung. On salvage Nivolumab every 2 weeks with good tolerability.  I have reviewed the patient's CT imaging.  Based upon the results, we will defer treatment today. No pre-chemo labs.  I have recommended a referral to CTS for bronchoscopy secondary to progression of disease versus mucous plugging.    Treatment deferred x 2 weeks for now until results from CT imaging can be sorted out.  Return in 2 weeks for follow-up and next treatment.

## 2015-07-19 ENCOUNTER — Encounter (HOSPITAL_BASED_OUTPATIENT_CLINIC_OR_DEPARTMENT_OTHER): Payer: Medicare Other | Admitting: Oncology

## 2015-07-19 ENCOUNTER — Encounter (HOSPITAL_COMMUNITY): Payer: Self-pay | Admitting: Oncology

## 2015-07-19 ENCOUNTER — Other Ambulatory Visit (HOSPITAL_COMMUNITY): Payer: Self-pay | Admitting: Oncology

## 2015-07-19 ENCOUNTER — Encounter (HOSPITAL_COMMUNITY): Payer: Medicare Other

## 2015-07-19 ENCOUNTER — Ambulatory Visit (HOSPITAL_COMMUNITY)
Admission: RE | Admit: 2015-07-19 | Discharge: 2015-07-19 | Disposition: A | Discharge status: Home / Self Care | Payer: Medicare Other | Source: Ambulatory Visit | Attending: Hematology & Oncology | Admitting: Hematology & Oncology

## 2015-07-19 VITALS — BP 139/70 | HR 73 | Temp 98.1°F | Resp 20 | Wt 160.2 lb

## 2015-07-19 DIAGNOSIS — Z923 Personal history of irradiation: Secondary | ICD-10-CM | POA: Diagnosis not present

## 2015-07-19 DIAGNOSIS — C3490 Malignant neoplasm of unspecified part of unspecified bronchus or lung: Secondary | ICD-10-CM

## 2015-07-19 DIAGNOSIS — Z720 Tobacco use: Secondary | ICD-10-CM | POA: Diagnosis not present

## 2015-07-19 DIAGNOSIS — C3431 Malignant neoplasm of lower lobe, right bronchus or lung: Secondary | ICD-10-CM | POA: Diagnosis present

## 2015-07-19 DIAGNOSIS — Z9221 Personal history of antineoplastic chemotherapy: Secondary | ICD-10-CM | POA: Diagnosis not present

## 2015-07-19 MED ORDER — IOHEXOL 300 MG/ML  SOLN
100.0000 mL | Freq: Once | INTRAMUSCULAR | Status: AC | PRN
  Administered 2015-07-19: 100 mL via INTRAVENOUS

## 2015-07-19 NOTE — Patient Instructions (Addendum)
[place] at [facility] Discharge Instructions  RECOMMENDATIONS MADE BY THE CONSULTANT AND ANY TEST RESULTS WILL BE SENT TO YOUR REFERRING PHYSICIAN.  Exam completed by Kirby Crigler today No chemotherapy today Defer for 2 weeks Refer to [facility] Return in 2 weeks for follow up with the doctor  Please call the clinic if you have any questions or concerns   Thank you for choosing [place] at [facility] to provide your oncology and hematology care.  To afford each patient quality time with our provider, please arrive at least 15 minutes before your scheduled appointment time.    You need to re-schedule your appointment should you arrive 10 or more minutes late.  We strive to give you quality time with our providers, and arriving late affects you and other patients whose appointments are after yours.  Also, if you no show three or more times for appointments you may be dismissed from the clinic at the providers discretion.     Again, thank you for choosing [facility].  Our hope is that these requests will decrease the amount of time that you wait before being seen by our physicians.       _____________________________________________________________  Should you have questions after your visit to [facility], please contact our office at [phone] between the hours of 8:30 a.m. and 4:30 p.m.  Voicemails left after 4:30 p.m. will not be returned until the following business day.  For prescription refill requests, have your pharmacy contact our office.

## 2015-07-21 ENCOUNTER — Other Ambulatory Visit (HOSPITAL_COMMUNITY): Payer: Self-pay | Admitting: Oncology

## 2015-07-21 DIAGNOSIS — C3492 Malignant neoplasm of unspecified part of left bronchus or lung: Secondary | ICD-10-CM

## 2015-07-21 MED ORDER — OXYCODONE-ACETAMINOPHEN 5-325 MG PO TABS: 1.0000 | ORAL_TABLET | ORAL | Status: DC | PRN

## 2015-07-24 ENCOUNTER — Encounter (HOSPITAL_COMMUNITY): Payer: Self-pay | Admitting: Oncology

## 2015-07-24 ENCOUNTER — Encounter (HOSPITAL_BASED_OUTPATIENT_CLINIC_OR_DEPARTMENT_OTHER): Payer: Medicare Other | Admitting: Oncology

## 2015-07-24 ENCOUNTER — Encounter (HOSPITAL_COMMUNITY): Payer: Self-pay | Admitting: Lab

## 2015-07-24 VITALS — BP 135/73 | HR 76 | Temp 98.1°F | Resp 18 | Wt 161.2 lb

## 2015-07-24 DIAGNOSIS — C3431 Malignant neoplasm of lower lobe, right bronchus or lung: Secondary | ICD-10-CM | POA: Diagnosis present

## 2015-07-24 DIAGNOSIS — C3490 Malignant neoplasm of unspecified part of unspecified bronchus or lung: Secondary | ICD-10-CM

## 2015-07-24 NOTE — Progress Notes (Unsigned)
Referral sent to Hospice.  Records faxed on 7/25

## 2015-07-24 NOTE — Progress Notes (Signed)
Patient is seen in a short-interval follow-up as a work-in.  I had previously performed imaging tests for restaging scans.  I had reviewed these in detail with hr last week.    Subsequently I sent a message to Dr. Roxan Hockey, and unfortunately, he agrees that her imaging appears to demonstrate tumor progression and role of bronchoscopy is likely futile.  As a result, I wanted to bring the patient in to review this new information and discuss goals of care.  Since therapy is no longer working, and the patient's performance status is poor, she is not a candidate for systemic chemotherapy.  With this information, Mersadez, on her own accord, reports that she would benefit most from Hospice intervention.  She reports that she is well educated regarding their services and she is agreeable to pursue this intervention.  As a result of this conversation, I have requested CTS consult be cancelled.  Order placed for [facility].    Return PRN.  Patient and plan discussed with Dr. Ancil Linsey and she is in agreement with the aforementioned.   Robynn Pane, PA-C 07/24/2015 3:59 PM

## 2015-07-24 NOTE — Patient Instructions (Signed)
[place] at [facility] Discharge Instructions  RECOMMENDATIONS MADE BY THE CONSULTANT AND ANY TEST RESULTS WILL BE SENT TO YOUR REFERRING PHYSICIAN.  Exam and discussion by Robynn Pane, PA-C Your scan shows disease progression. We will make a referral to Hospice. Want to do what is needed to keep you comfortable. Return here as needed.  Thank you for choosing [place] at [facility] to provide your oncology and hematology care.  To afford each patient quality time with our provider, please arrive at least 15 minutes before your scheduled appointment time.    You need to re-schedule your appointment should you arrive 10 or more minutes late.  We strive to give you quality time with our providers, and arriving late affects you and other patients whose appointments are after yours.  Also, if you no show three or more times for appointments you may be dismissed from the clinic at the providers discretion.     Again, thank you for choosing [facility].  Our hope is that these requests will decrease the amount of time that you wait before being seen by our physicians.       _____________________________________________________________  Should you have questions after your visit to [facility], please contact our office at [phone] between the hours of 8:30 a.m. and 4:30 p.m.  Voicemails left after 4:30 p.m. will not be returned until the following business day.  For prescription refill requests, have your pharmacy contact our office.

## 2015-07-25 ENCOUNTER — Other Ambulatory Visit (HOSPITAL_COMMUNITY): Payer: Self-pay | Admitting: Oncology

## 2015-07-25 ENCOUNTER — Inpatient Hospital Stay (HOSPITAL_COMMUNITY): Payer: Self-pay

## 2015-07-25 ENCOUNTER — Encounter (HOSPITAL_COMMUNITY): Payer: Medicare Other

## 2015-07-25 DIAGNOSIS — C3492 Malignant neoplasm of unspecified part of left bronchus or lung: Secondary | ICD-10-CM

## 2015-07-25 MED ORDER — FENTANYL 25 MCG/HR TD PT72: 25.0000 ug | MEDICATED_PATCH | TRANSDERMAL | Status: DC

## 2015-07-26 ENCOUNTER — Encounter: Payer: Medicare Other | Admitting: Surgery

## 2015-07-26 ENCOUNTER — Encounter (HOSPITAL_COMMUNITY): Payer: Self-pay | Admitting: Emergency Medicine

## 2015-07-26 ENCOUNTER — Other Ambulatory Visit: Payer: Self-pay

## 2015-07-26 ENCOUNTER — Emergency Department (HOSPITAL_COMMUNITY): Payer: Medicare Other

## 2015-07-26 ENCOUNTER — Observation Stay (HOSPITAL_COMMUNITY)
Admission: EM | Admit: 2015-07-26 | Discharge: 2015-07-27 | Disposition: A | Discharge status: Home / Self Care | Payer: Medicare Other | Attending: Internal Medicine | Admitting: Internal Medicine

## 2015-07-26 DIAGNOSIS — J9602 Acute respiratory failure with hypercapnia: Secondary | ICD-10-CM | POA: Diagnosis not present

## 2015-07-26 DIAGNOSIS — J441 Chronic obstructive pulmonary disease with (acute) exacerbation: Secondary | ICD-10-CM | POA: Diagnosis not present

## 2015-07-26 DIAGNOSIS — N951 Menopausal and female climacteric states: Secondary | ICD-10-CM | POA: Insufficient documentation

## 2015-07-26 DIAGNOSIS — K219 Gastro-esophageal reflux disease without esophagitis: Secondary | ICD-10-CM

## 2015-07-26 DIAGNOSIS — E039 Hypothyroidism, unspecified: Secondary | ICD-10-CM | POA: Diagnosis not present

## 2015-07-26 DIAGNOSIS — Z8701 Personal history of pneumonia (recurrent): Secondary | ICD-10-CM | POA: Diagnosis not present

## 2015-07-26 DIAGNOSIS — R0689 Other abnormalities of breathing: Secondary | ICD-10-CM

## 2015-07-26 DIAGNOSIS — Z79899 Other long term (current) drug therapy: Secondary | ICD-10-CM | POA: Insufficient documentation

## 2015-07-26 DIAGNOSIS — E119 Type 2 diabetes mellitus without complications: Secondary | ICD-10-CM | POA: Diagnosis not present

## 2015-07-26 DIAGNOSIS — C3492 Malignant neoplasm of unspecified part of left bronchus or lung: Secondary | ICD-10-CM

## 2015-07-26 DIAGNOSIS — Z85118 Personal history of other malignant neoplasm of bronchus and lung: Secondary | ICD-10-CM | POA: Insufficient documentation

## 2015-07-26 DIAGNOSIS — Z515 Encounter for palliative care: Secondary | ICD-10-CM

## 2015-07-26 DIAGNOSIS — J948 Other specified pleural conditions: Secondary | ICD-10-CM | POA: Diagnosis not present

## 2015-07-26 DIAGNOSIS — I10 Essential (primary) hypertension: Secondary | ICD-10-CM | POA: Diagnosis not present

## 2015-07-26 DIAGNOSIS — R000 Tachycardia, unspecified: Secondary | ICD-10-CM | POA: Insufficient documentation

## 2015-07-26 DIAGNOSIS — Z794 Long term (current) use of insulin: Secondary | ICD-10-CM | POA: Insufficient documentation

## 2015-07-26 DIAGNOSIS — M1990 Unspecified osteoarthritis, unspecified site: Secondary | ICD-10-CM | POA: Diagnosis not present

## 2015-07-26 DIAGNOSIS — R0602 Shortness of breath: Secondary | ICD-10-CM | POA: Diagnosis present

## 2015-07-26 DIAGNOSIS — R7981 Abnormal blood-gas level: Secondary | ICD-10-CM | POA: Diagnosis not present

## 2015-07-26 DIAGNOSIS — J9600 Acute respiratory failure, unspecified whether with hypoxia or hypercapnia: Secondary | ICD-10-CM

## 2015-07-26 DIAGNOSIS — Z8673 Personal history of transient ischemic attack (TIA), and cerebral infarction without residual deficits: Secondary | ICD-10-CM | POA: Insufficient documentation

## 2015-07-26 DIAGNOSIS — Z720 Tobacco use: Secondary | ICD-10-CM | POA: Insufficient documentation

## 2015-07-26 DIAGNOSIS — F419 Anxiety disorder, unspecified: Secondary | ICD-10-CM | POA: Diagnosis not present

## 2015-07-26 DIAGNOSIS — J90 Pleural effusion, not elsewhere classified: Principal | ICD-10-CM | POA: Insufficient documentation

## 2015-07-26 DIAGNOSIS — F329 Major depressive disorder, single episode, unspecified: Secondary | ICD-10-CM | POA: Insufficient documentation

## 2015-07-26 DIAGNOSIS — F4310 Post-traumatic stress disorder, unspecified: Secondary | ICD-10-CM | POA: Insufficient documentation

## 2015-07-26 DIAGNOSIS — F418 Other specified anxiety disorders: Secondary | ICD-10-CM

## 2015-07-26 DIAGNOSIS — J189 Pneumonia, unspecified organism: Secondary | ICD-10-CM

## 2015-07-26 LAB — BASIC METABOLIC PANEL
ANION GAP: 8 (ref 5–15)
BUN: 6 mg/dL (ref 6–20)
CHLORIDE: 93 mmol/L — AB (ref 101–111)
CO2: 32 mmol/L (ref 22–32)
CREATININE: 0.63 mg/dL (ref 0.44–1.00)
Calcium: 8.1 mg/dL — ABNORMAL LOW (ref 8.9–10.3)
GFR calc Af Amer: >60 mL/min (ref >60)
GFR calc non Af Amer: >60 mL/min (ref >60)
Glucose, Bld: 282 mg/dL — ABNORMAL HIGH (ref 65–99)
Potassium: 3.7 mmol/L (ref 3.5–5.1)
SODIUM: 133 mmol/L — AB (ref 135–145)

## 2015-07-26 LAB — BLOOD GAS, VENOUS
Acid-Base Excess: 5.4 mmol/L — ABNORMAL HIGH (ref 0.0–2.0)
Bicarbonate: 32.3 mEq/L — ABNORMAL HIGH (ref 20.0–24.0)
O2 Content: 2 L/min
O2 Saturation: 96.4 %
PATIENT TEMPERATURE: 98
PCO2 VEN: 65 mmHg — AB (ref 45.0–50.0)
TCO2: 30.7 mmol/L (ref 0–100)
pH, Ven: 7.317 — ABNORMAL HIGH (ref 7.250–7.300)
pO2, Ven: 94.7 mmHg — ABNORMAL HIGH (ref 30.0–45.0)

## 2015-07-26 LAB — CBC WITH DIFFERENTIAL/PLATELET
Basophils Absolute: 0 10*3/uL (ref 0.0–0.1)
Basophils Relative: 0 % (ref 0–1)
Eosinophils Absolute: 0.1 10*3/uL (ref 0.0–0.7)
Eosinophils Relative: 1 % (ref 0–5)
HCT: 31.5 % — ABNORMAL LOW (ref 36.0–46.0)
Hemoglobin: 9.5 g/dL — ABNORMAL LOW (ref 12.0–15.0)
Lymphocytes Relative: 10 % — ABNORMAL LOW (ref 12–46)
Lymphs Abs: 0.8 10*3/uL (ref 0.7–4.0)
MCH: 27.6 pg (ref 26.0–34.0)
MCHC: 30.2 g/dL (ref 30.0–36.0)
MCV: 91.6 fL (ref 78.0–100.0)
Monocytes Absolute: 0.4 10*3/uL (ref 0.1–1.0)
Monocytes Relative: 5 % (ref 3–12)
Neutro Abs: 6.4 10*3/uL (ref 1.7–7.7)
Neutrophils Relative %: 84 % — ABNORMAL HIGH (ref 43–77)
Platelets: 269 10*3/uL (ref 150–400)
RBC: 3.44 MIL/uL — ABNORMAL LOW (ref 3.87–5.11)
RDW: 15.4 % (ref 11.5–15.5)
WBC: 7.7 10*3/uL (ref 4.0–10.5)

## 2015-07-26 LAB — URINALYSIS, ROUTINE W REFLEX MICROSCOPIC
BILIRUBIN URINE: NEGATIVE
Glucose, UA: 100 mg/dL — AB
Hgb urine dipstick: NEGATIVE
Ketones, ur: NEGATIVE mg/dL
Leukocytes, UA: NEGATIVE
Nitrite: NEGATIVE
PH: 6 (ref 5.0–8.0)
PROTEIN: NEGATIVE mg/dL
Specific Gravity, Urine: 1.015 (ref 1.005–1.030)
Urobilinogen, UA: 0.2 mg/dL (ref 0.0–1.0)

## 2015-07-26 LAB — GLUCOSE, CAPILLARY
GLUCOSE-CAPILLARY: 366 mg/dL — AB (ref 65–99)
Glucose-Capillary: 102 mg/dL — ABNORMAL HIGH (ref 65–99)

## 2015-07-26 MED ORDER — DEXTROSE 5 % IV SOLN
INTRAVENOUS | Status: AC
  Filled 2015-07-26: qty 2

## 2015-07-26 MED ORDER — ONDANSETRON HCL 4 MG/2ML IJ SOLN: 4.0000 mg | Freq: Three times a day (TID) | INTRAMUSCULAR | Status: AC | PRN

## 2015-07-26 MED ORDER — ONDANSETRON HCL 4 MG PO TABS: 4.0000 mg | ORAL_TABLET | Freq: Four times a day (QID) | ORAL | Status: DC | PRN

## 2015-07-26 MED ORDER — ALPRAZOLAM 1 MG PO TABS
2.0000 mg | ORAL_TABLET | Freq: Three times a day (TID) | ORAL | Status: DC
  Administered 2015-07-26 – 2015-07-27 (×2): 2 mg via ORAL
  Filled 2015-07-26 (×2): qty 2

## 2015-07-26 MED ORDER — ALBUTEROL (5 MG/ML) CONTINUOUS INHALATION SOLN
10.0000 mg | INHALATION_SOLUTION | RESPIRATORY_TRACT | Status: DC
  Administered 2015-07-26: 10 mg via RESPIRATORY_TRACT
  Filled 2015-07-26: qty 20

## 2015-07-26 MED ORDER — MORPHINE SULFATE 4 MG/ML IJ SOLN
4.0000 mg | INTRAMUSCULAR | Status: DC | PRN
  Filled 2015-07-26: qty 1

## 2015-07-26 MED ORDER — DEXTROSE 5 % IV SOLN
INTRAVENOUS | Status: AC
  Filled 2015-07-26: qty 500

## 2015-07-26 MED ORDER — METHYLPREDNISOLONE SODIUM SUCC 125 MG IJ SOLR
125.0000 mg | Freq: Once | INTRAMUSCULAR | Status: AC
  Administered 2015-07-26: 125 mg via INTRAVENOUS
  Filled 2015-07-26: qty 2

## 2015-07-26 MED ORDER — VANCOMYCIN HCL 10 G IV SOLR
1500.0000 mg | Freq: Once | INTRAVENOUS | Status: AC
  Administered 2015-07-26: 1500 mg via INTRAVENOUS
  Filled 2015-07-26: qty 1500

## 2015-07-26 MED ORDER — GUAIFENESIN ER 600 MG PO TB12
1200.0000 mg | ORAL_TABLET | Freq: Two times a day (BID) | ORAL | Status: DC
  Administered 2015-07-26 – 2015-07-27 (×2): 1200 mg via ORAL
  Filled 2015-07-26 (×2): qty 2

## 2015-07-26 MED ORDER — DEXTROSE 5 % IV SOLN
2.0000 g | Freq: Three times a day (TID) | INTRAVENOUS | Status: DC
  Administered 2015-07-26 – 2015-07-27 (×2): 2 g via INTRAVENOUS
  Filled 2015-07-26 (×6): qty 2

## 2015-07-26 MED ORDER — FUROSEMIDE 10 MG/ML IJ SOLN
40.0000 mg | Freq: Every day | INTRAMUSCULAR | Status: DC
  Administered 2015-07-26: 40 mg via INTRAVENOUS
  Filled 2015-07-26 (×2): qty 4

## 2015-07-26 MED ORDER — DEXTROSE 5 % IV SOLN
500.0000 mg | INTRAVENOUS | Status: DC
  Filled 2015-07-26: qty 500

## 2015-07-26 MED ORDER — SENNOSIDES-DOCUSATE SODIUM 8.6-50 MG PO TABS
1.0000 | ORAL_TABLET | Freq: Two times a day (BID) | ORAL | Status: DC
  Administered 2015-07-26 – 2015-07-27 (×2): 1 via ORAL
  Filled 2015-07-26 (×2): qty 1

## 2015-07-26 MED ORDER — DEXTROSE 5 % IV SOLN
1.0000 g | INTRAVENOUS | Status: DC
  Filled 2015-07-26: qty 10

## 2015-07-26 MED ORDER — PANTOPRAZOLE SODIUM 40 MG PO TBEC
80.0000 mg | DELAYED_RELEASE_TABLET | Freq: Every day | ORAL | Status: DC
  Administered 2015-07-27: 80 mg via ORAL
  Filled 2015-07-26: qty 2

## 2015-07-26 MED ORDER — SERTRALINE HCL 50 MG PO TABS
200.0000 mg | ORAL_TABLET | Freq: Every day | ORAL | Status: DC
  Administered 2015-07-27: 200 mg via ORAL
  Filled 2015-07-26 (×2): qty 4

## 2015-07-26 MED ORDER — ATENOLOL 25 MG PO TABS
50.0000 mg | ORAL_TABLET | Freq: Every day | ORAL | Status: DC
  Administered 2015-07-27: 50 mg via ORAL
  Filled 2015-07-26 (×2): qty 2

## 2015-07-26 MED ORDER — FENTANYL 25 MCG/HR TD PT72
25.0000 ug | MEDICATED_PATCH | TRANSDERMAL | Status: DC
  Filled 2015-07-26: qty 1

## 2015-07-26 MED ORDER — LEVOTHYROXINE SODIUM 25 MCG PO TABS
125.0000 ug | ORAL_TABLET | Freq: Every day | ORAL | Status: DC
  Administered 2015-07-27: 125 ug via ORAL
  Filled 2015-07-26 (×2): qty 1

## 2015-07-26 MED ORDER — IPRATROPIUM-ALBUTEROL 0.5-2.5 (3) MG/3ML IN SOLN
3.0000 mL | RESPIRATORY_TRACT | Status: DC | PRN
  Administered 2015-07-27: 3 mL via RESPIRATORY_TRACT

## 2015-07-26 MED ORDER — IPRATROPIUM-ALBUTEROL 0.5-2.5 (3) MG/3ML IN SOLN
3.0000 mL | RESPIRATORY_TRACT | Status: DC
  Administered 2015-07-26 – 2015-07-27 (×3): 3 mL via RESPIRATORY_TRACT
  Filled 2015-07-26 (×5): qty 3

## 2015-07-26 MED ORDER — FENTANYL 25 MCG/HR TD PT72: 25.0000 ug | MEDICATED_PATCH | TRANSDERMAL | Status: DC

## 2015-07-26 MED ORDER — METHYLPREDNISOLONE SODIUM SUCC 125 MG IJ SOLR
60.0000 mg | Freq: Every day | INTRAMUSCULAR | Status: DC
Start: 2015-07-27 — End: 2015-07-27
  Filled 2015-07-26: qty 2

## 2015-07-26 MED ORDER — DEXTROSE 5 % IV SOLN
500.0000 mg | Freq: Once | INTRAVENOUS | Status: AC
  Administered 2015-07-26: 500 mg via INTRAVENOUS

## 2015-07-26 MED ORDER — HEPARIN SODIUM (PORCINE) 5000 UNIT/ML IJ SOLN
5000.0000 [IU] | Freq: Three times a day (TID) | INTRAMUSCULAR | Status: DC
  Filled 2015-07-26 (×2): qty 1

## 2015-07-26 MED ORDER — ALBUTEROL SULFATE (2.5 MG/3ML) 0.083% IN NEBU: 2.5000 mg | INHALATION_SOLUTION | RESPIRATORY_TRACT | Status: DC

## 2015-07-26 MED ORDER — ALPRAZOLAM 0.5 MG PO TABS
1.0000 mg | ORAL_TABLET | Freq: Once | ORAL | Status: AC
  Administered 2015-07-26: 1 mg via ORAL
  Filled 2015-07-26: qty 2

## 2015-07-26 MED ORDER — CHLORZOXAZONE 500 MG PO TABS
500.0000 mg | ORAL_TABLET | Freq: Four times a day (QID) | ORAL | Status: DC
  Administered 2015-07-26 – 2015-07-27 (×4): 500 mg via ORAL
  Filled 2015-07-26 (×8): qty 1

## 2015-07-26 MED ORDER — DEXTROSE 5 % IV SOLN
1.0000 g | Freq: Once | INTRAVENOUS | Status: AC
  Administered 2015-07-26: 1 g via INTRAVENOUS
  Filled 2015-07-26: qty 10

## 2015-07-26 MED ORDER — INSULIN ASPART 100 UNIT/ML ~~LOC~~ SOLN
0.0000 [IU] | Freq: Three times a day (TID) | SUBCUTANEOUS | Status: DC
  Administered 2015-07-27: 5 [IU] via SUBCUTANEOUS
  Administered 2015-07-27: 15 [IU] via SUBCUTANEOUS

## 2015-07-26 MED ORDER — ONDANSETRON HCL 4 MG/2ML IJ SOLN: 4.0000 mg | Freq: Four times a day (QID) | INTRAMUSCULAR | Status: DC | PRN

## 2015-07-26 MED ORDER — INSULIN ASPART 100 UNIT/ML ~~LOC~~ SOLN: 0.0000 [IU] | Freq: Every day | SUBCUTANEOUS | Status: DC

## 2015-07-26 MED ORDER — TRAZODONE HCL 50 MG PO TABS
300.0000 mg | ORAL_TABLET | Freq: Every day | ORAL | Status: DC
  Administered 2015-07-26: 300 mg via ORAL
  Filled 2015-07-26: qty 6

## 2015-07-26 MED ORDER — LISINOPRIL 5 MG PO TABS
5.0000 mg | ORAL_TABLET | Freq: Every day | ORAL | Status: DC
  Administered 2015-07-27: 5 mg via ORAL
  Filled 2015-07-26: qty 1

## 2015-07-26 NOTE — ED Notes (Signed)
Spoke with Wells Guiles at [facility] long term care facility regarding pt's admission. Informed facility that pt will be admitted.

## 2015-07-26 NOTE — Progress Notes (Signed)
ANTIBIOTIC CONSULT NOTE - INITIAL  Pharmacy Consult for Vancomycin Indication: pneumonia  Allergies  Allergen Reactions  . Carafate [Sucralfate] Nausea Only  . Prednisone Other (See Comments)    insomnia  . Nsaids     GI Upset  . Aspirin Other (See Comments)    Gi symptoms. Does NOT take ibuprofen or other NSAIDS  . Aspirin Nausea And Vomiting  . Ibuprofen     Gi upset  . Nsaids Nausea And Vomiting  . Dulera [Mometasone Furo-Formoterol Fum] Palpitations    Panting " my body was twisted inside and out"    Patient Measurements: Height: '5\' 6"'$  (167.6 cm) Weight: 160 lb (72.576 kg) IBW/kg (Calculated) : 59.3  Vital Signs: Temp: 98 F (36.7 C) (07/27 1248) Temp Source: Oral (07/27 1248) BP: 142/74 mmHg (07/27 1600) Pulse Rate: 79 (07/27 1600) Intake/Output from previous day:   Intake/Output from this shift:    Labs:  Recent Labs  07/26/15 1304  WBC 7.7  HGB 9.5*  PLT 269  CREATININE 0.63   Estimated Creatinine Clearance: 69.6 mL/min (by C-G formula based on Cr of 0.63). No results for input(s): VANCOTROUGH, VANCOPEAK, VANCORANDOM, GENTTROUGH, GENTPEAK, GENTRANDOM, TOBRATROUGH, TOBRAPEAK, TOBRARND, AMIKACINPEAK, AMIKACINTROU, AMIKACIN in the last 72 hours.   Microbiology: No results found for this or any previous visit (from the past 720 hour(s)).  Medical History: Past Medical History  Diagnosis Date  . H/O: stroke 2012, 2000,     X3  . Anxiety   . PTSD (post-traumatic stress disorder)   . Depression   . SVD (spontaneous vaginal delivery)     x 1  . Smoker   . Osteoarthritis     knees, hips hands,   . Shortness of breath   . Pain     SEVERE PAIN BACK, RIGHT HIP AND KNEES--HARRINGTON RODS AND CERVICAL PLATES--USES WALKER OR CANE WHEN AMBULATING - GOES TO A PAIN CLINIC-STATES SHE NEEDS RT HIP AND BOTH KNEES REPLACED. PT STATES SHE WAS TOLD THE CEMENT AROUND THE HARRINGTON RODS IS CRACKED.  [name] Hot flashes, menopausal     SEVERE  . Complication of  anesthesia 03/02/13    severe headache,vertigo postop  . COPD (chronic obstructive pulmonary disease)     stable  . 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 Lung cancer   . Status post radiation therapy within four to twelve weeks 10/18/13-12/06/13    lung ca  66Gy  . Family history of anesthesia complication     equipment failure caused problem, suffered a stroke & heartattack  . Diabetes mellitus, type II   . Diabetes mellitus without complication   . Cancer   . Hypertension   . COPD (chronic obstructive pulmonary disease)   . Squamous cell lung cancer 10/02/2013  . Pneumonia   . Hypothyroidism   . History of stomach ulcers   . History of hiatal hernia   . PONV (postoperative nausea and vomiting)    Anti-infectives    Start     Dose/Rate Route Frequency Ordered Stop   07/27/15 1530  azithromycin (ZITHROMAX) 500 mg in dextrose 5 % 250 mL IVPB     500 mg 250 mL/hr over 60 Minutes Intravenous Every 24 hours 07/26/15 1516     07/27/15 1530  cefTRIAXone (ROCEPHIN) 1 g in dextrose 5 % 50 mL IVPB     1 g 100 mL/hr over 30 Minutes Intravenous Every 24 hours 07/26/15 1516     07/26/15 1730  vancomycin (VANCOCIN) 1,500 mg in sodium chloride  0.9 % 500 mL IVPB     1,500 mg 250 mL/hr over 120 Minutes Intravenous  Once 07/26/15 1650     07/26/15 1645  cefTAZidime (FORTAZ) 2 g in dextrose 5 % 50 mL IVPB     2 g 100 mL/hr over 30 Minutes Intravenous 3 times per day 07/26/15 1630     07/26/15 1500  cefTRIAXone (ROCEPHIN) 1 g in dextrose 5 % 50 mL IVPB     1 g 100 mL/hr over 30 Minutes Intravenous  Once 07/26/15 1456 07/26/15 1629   07/26/15 1500  azithromycin (ZITHROMAX) 500 mg in dextrose 5 % 250 mL IVPB     500 mg 250 mL/hr over 60 Minutes Intravenous  Once 07/26/15 1456       Assessment: 69yo female resident of LTC facility.  Pt c/o SOB with associated sputum and cough.  Pt given multiple antibiotics in ED.  Pharmacy asked to initiate Vancomycin for suspected pneumonia. Pt has good renal  fxn.  Goal of Therapy:  Vancomycin trough level 15-20 mcg/ml  Plan:  Vancomycin '1500mg'$  IV now x 1 (loading dose) then Vancomycin '1000mg'$  IV q12hrs Check trough at steady state Monitor labs, renal fxn and c/s Deescalate antibiotics when improved / appropriate  Hart Robinsons A 07/26/2015,4:52 PM

## 2015-07-26 NOTE — ED Provider Notes (Signed)
CSN: [id number]     Arrival date & time 07/26/15  1248 History   First MD Initiated Contact with Patient 07/26/15 1252     Chief Complaint  Patient presents with  . Shortness of Breath     (Consider location/radiation/quality/duration/timing/severity/associated sxs/prior Treatment) HPI Comments: 69 year old female with extensive past medical history including COPD, advanced lung cancer, CVA who presents with shortness of breath. Patient has had worsening SOB for which she is on 2L home O2. Her oncologist recently referred her to hospice and she has a meeting set up for later this week. She called EMS today for worsening shortness of breath at home and was noted to be 84% on room air. She received albuterol during transport. The patient denies any chest pain and states that these symptoms are consistent with the past several months. She has had increased cough for the past one week. No fevers. No vomiting, diarrhea, or abdominal pain. No urinary symptoms or blood in her stool.  The history is provided by the patient.    Past Medical History  Diagnosis Date  . H/O: stroke 2012, 2000,     X3  . Anxiety   . PTSD (post-traumatic stress disorder)   . Depression   . SVD (spontaneous vaginal delivery)     x 1  . Smoker   . Osteoarthritis     knees, hips hands,   . Shortness of breath   . Pain     SEVERE PAIN BACK, RIGHT HIP AND KNEES--HARRINGTON RODS AND CERVICAL PLATES--USES WALKER OR CANE WHEN AMBULATING - GOES TO A PAIN CLINIC-STATES SHE NEEDS RT HIP AND BOTH KNEES REPLACED. PT STATES SHE WAS TOLD THE CEMENT AROUND THE HARRINGTON RODS IS CRACKED.  [name] Hot flashes, menopausal     SEVERE  . Complication of anesthesia 03/02/13    severe headache,vertigo postop  . COPD (chronic obstructive pulmonary disease)     stable  . 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 Lung cancer   . Status post radiation therapy within four to twelve weeks 10/18/13-12/06/13    lung ca  66Gy  . Family history of  anesthesia complication     equipment failure caused problem, suffered a stroke & heartattack  . Diabetes mellitus, type II   . Diabetes mellitus without complication   . Cancer   . Hypertension   . COPD (chronic obstructive pulmonary disease)   . Squamous cell lung cancer 10/02/2013  . Pneumonia   . Hypothyroidism   . History of stomach ulcers   . History of hiatal hernia   . PONV (postoperative nausea and vomiting)    Past Surgical History  Procedure Laterality Date  . Carpal tunnel release      BILATERAL  . Nasal sinus surgery    . Lumbar fusion      L4-S1  . Cervical fusion    . Hernia repair    . Salpingoophorectomy Right   . Vaginal hysterectomy      TAH w/ ovary removal  . Transthoracic echocardiogram  12-03-2011    LVSF NORMAL/ EF 60-65%  . Vulvectomy N/A 03/02/2013    Procedure: WIDE LOCAL EXCISION VULVAR;  Surgeon: Imagene Gurney A. Alycia Rossetti, MD;  Location: [facility];  Service: Gynecology;  Laterality: N/A;  . Co2 laser application N/A 03/31/271    Procedure: LASER APPLICATION OF THE VULVA;  Surgeon: Janie Morning, MD;  Location: [facility];  Service: Gynecology;  Laterality: N/A;  . Video bronchoscopy Bilateral 09/23/2013  Procedure: VIDEO BRONCHOSCOPY WITHOUT FLUORO;  Surgeon: Tanda Rockers, MD;  Location: [facility];  Service: Cardiopulmonary;  Laterality: Bilateral;  . Hemiarthroplasty shoulder fracture Left 05/25/2014    DR CHANDLER  . Reverse shoulder arthroplasty Left 05/24/2014    Procedure:   Hemi-Arthroplasty  Left Shoulder;  Surgeon: Nita Sells, MD;  Location: [facility];  Service: Orthopedics;  Laterality: Left;  . Orif humerus fracture Right 12/12/2014    Procedure: OPEN REDUCTION INTERNAL FIXATION (ORIF) PROXIMAL HUMERUS FRACTURE;  Surgeon: Nita Sells, MD;  Location: [place];  Service: Orthopedics;  Laterality: Right;  . Tonsillectomy    . Hardware removal Right 06/22/2015    Procedure: HARDWARE REMOVAL GLENOHUMERAL SUSPENSION;   Surgeon: Tania Ade, MD;  Location: [place];  Service: Orthopedics;  Laterality: Right;  Deep hardware removal of right shoulder   Family History  Problem Relation Age of Onset  . Cancer Mother     COLON  . Depression Mother   . Heart disease Father   . Diabetes Maternal Aunt   . Cancer Maternal Grandfather     KIDNEY   . Diabetes Maternal Grandfather   . Hypertension Maternal Grandfather   . Heart disease Maternal Grandfather   . Drug abuse Sister    History  Substance Use Topics  . Smoking status: Current Every Day Smoker -- 0.25 packs/day for 50 years    Types: Cigarettes  . Smokeless tobacco: Never Used  . Alcohol Use: No   OB History    Gravida Para Term Preterm AB TAB SAB Ectopic Multiple Living   2 1 0 0 1 0 0 0       Review of Systems 10 Systems reviewed and are negative for acute change except as noted in the HPI.    Allergies  Carafate; Prednisone; Nsaids; Aspirin; Aspirin; Ibuprofen; Nsaids; and Dulera  Home Medications   Prior to Admission medications   Medication Sig Start Date End Date Taking? Authorizing Provider  acetaminophen (TYLENOL) 325 MG tablet Take 650 mg by mouth every 6 (six) hours as needed for mild pain or moderate pain.    Yes Historical Provider, MD  albuterol (PROVENTIL HFA;VENTOLIN HFA) 108 (90 BASE) MCG/ACT inhaler 2 puffs every 4 - 6 hours as needed for dyspnea Patient taking differently: Inhale 2 puffs into the lungs every 4 (four) hours as needed (dyspnea).  04/07/15  Yes Patrici Ranks, MD  albuterol (PROVENTIL) (2.5 MG/3ML) 0.083% nebulizer solution Take 2.5 mg by nebulization every 6 (six) hours as needed for wheezing or shortness of breath.   Yes Historical Provider, MD  alprazolam Duanne Moron) 2 MG tablet Take 1 tablet (2 mg total) by mouth 3 (three) times daily. 05/31/15  Yes Cloria Spring, MD  ANORO ELLIPTA 62.5-25 MCG/INH AEPB Inhale 1 puff into the lungs daily. 03/17/15  Yes Historical Provider, MD  atenolol (TENORMIN) 50 MG  tablet Take 1 tablet (50 mg total) by mouth daily. 03/15/15  Yes Estela Leonie Green, MD  chlorzoxazone (PARAFON) 500 MG tablet Take 1 tablet (500 mg total) by mouth 4 (four) times daily. 03/15/15  Yes Estela Leonie Green, MD  docusate sodium (COLACE) 100 MG capsule Take 200 mg by mouth 2 (two) times daily.    Yes Historical Provider, MD  esomeprazole (NEXIUM) 40 MG capsule Take 1 capsule (40 mg total) by mouth daily. 03/15/15  Yes Erline Hau, MD  fentaNYL (DURAGESIC - DOSED MCG/HR) 25 MCG/HR patch Place 1 patch (25 mcg total) onto the  skin every 3 (three) days. 07/25/15  Yes Manon Hilding Kefalas, PA-C  fluticasone (FLONASE) 50 MCG/ACT nasal spray Place 1 spray into both nostrils daily as needed for allergies or rhinitis. 03/15/15  Yes Estela Leonie Green, MD  guaiFENesin (MUCINEX) 600 MG 12 hr tablet Take 1,200 mg by mouth 2 (two) times daily.   Yes Historical Provider, MD  insulin aspart (NOVOLOG FLEXPEN) 100 UNIT/ML FlexPen Inject 5 Units into the skin 3 (three) times daily with meals. Patient taking differently: Inject 4-11 Units into the skin 3 (three) times daily with meals. 4 units before breakfast if b/s is 70 or greater,9 units before lunch and supper(if patient is eating) or if b/s 70 or greater,11 units for b/s greater than 200 03/15/15  Yes Estela Leonie Green, MD  Insulin Glargine (LANTUS SOLOSTAR) 100 UNIT/ML Solostar Pen Inject 20 Units into the skin daily at 10 pm. Patient taking differently: Inject 21 Units into the skin daily at 10 pm.  03/15/15  Yes Erline Hau, MD  levothyroxine (SYNTHROID) 125 MCG tablet Take 1 tablet (125 mcg total) by mouth daily before breakfast. 06/20/15  Yes Manon Hilding Kefalas, PA-C  lidocaine (LIDODERM) 5 % Place 1 patch onto the skin daily. Remove & Discard patch within 12 hours or as directed by MD   Yes Historical Provider, MD  lidocaine-prilocaine (EMLA) cream Apply a quarter size amount to port site 1 hour prior to  chemo. Do not rub in. Cover with plastic wrap. 06/20/15  Yes Baird Cancer, PA-C  lisinopril (PRINIVIL,ZESTRIL) 5 MG tablet Take 1 tablet (5 mg total) by mouth daily. 03/15/15  Yes Erline Hau, MD  loratadine (CLARITIN) 10 MG tablet Take 10 mg by mouth daily.   Yes Historical Provider, MD  MOVANTIK 25 MG TABS tablet Take 25 mg by mouth daily. 05/19/15  Yes Historical Provider, MD  ondansetron (ZOFRAN) 8 MG tablet Take 1 tablet (8 mg total) by mouth every 8 (eight) hours as needed for nausea or vomiting. 04/07/15  Yes Patrici Ranks, MD  oxyCODONE-acetaminophen (PERCOCET/ROXICET) 5-325 MG per tablet Take 1 tablet by mouth every 4 (four) hours as needed for moderate pain or severe pain. 07/21/15  Yes Thomas S Kefalas, PA-C  SENEXON-S 8.6-50 MG per tablet TAKE 1 TABLET BY MOUTH TWICE DAILY. 07/18/15  Yes Patrici Ranks, MD  sertraline (ZOLOFT) 100 MG tablet Take 2 tablets (200 mg total) by mouth daily. 05/31/15  Yes Cloria Spring, MD  traZODone (DESYREL) 150 MG tablet Take 2 tablets (300 mg total) by mouth at bedtime. 05/31/15  Yes Cloria Spring, MD  Insulin Pen Needle (NOVOFINE) 30G X 8 MM MISC Inject 1 packet into the skin as needed.    Historical Provider, MD  spironolactone (ALDACTONE) 50 MG tablet Take 2 tablets (100 mg total) by mouth daily. Patient not taking: Reported on 07/24/2015 05/23/15   Manon Hilding Kefalas, PA-C  tiotropium (SPIRIVA) 18 MCG inhalation capsule Place 1 capsule (18 mcg total) into inhaler and inhale daily. Patient not taking: Reported on 07/24/2015 03/15/15   Erline Hau, MD   BP 131/62 mmHg  Pulse 79  Temp(Src) 98.7 F (37.1 C) (Oral)  Resp 20  Ht '5\' 6"'$  (1.676 m)  Wt 162 lb 9.6 oz (73.755 kg)  BMI 26.26 kg/m2  SpO2 97% Physical Exam  Constitutional: She is oriented to person, place, and time.  Appears older than stated age, tachypneic and uncomfortable but in no acute distress  HENT:  Head: Normocephalic and atraumatic.  Moist mucous  membranes  Eyes: Conjunctivae are normal. Pupils are equal, round, and reactive to light.  Neck: Neck supple.  Cardiovascular: Regular rhythm and normal heart sounds.   No murmur heard. Tachycardic  Pulmonary/Chest:  Increased work of breathing with crackles bilaterally and severely diminished breath sounds right lower lung  Abdominal: Soft. Bowel sounds are normal. She exhibits no distension. There is no tenderness.  Musculoskeletal: She exhibits no edema.  Neurological: She is alert and oriented to person, place, and time.  Fluent speech  Skin: Skin is warm and dry.  Psychiatric: She has a normal mood and affect. Judgment normal.  Nursing note and vitals reviewed.   ED Course  Procedures (including critical care time) Labs Review Labs Reviewed  BLOOD GAS, VENOUS - Abnormal; Notable for the following:    pH, Ven 7.317 (*)    pCO2, Ven 65.0 (*)    pO2, Ven 94.7 (*)    Bicarbonate 32.3 (*)    Acid-Base Excess 5.4 (*)    All other components within normal limits  CBC WITH DIFFERENTIAL/PLATELET - Abnormal; Notable for the following:    RBC 3.44 (*)    Hemoglobin 9.5 (*)    HCT 31.5 (*)    Neutrophils Relative % 84 (*)    Lymphocytes Relative 10 (*)    All other components within normal limits  BASIC METABOLIC PANEL - Abnormal; Notable for the following:    Sodium 133 (*)    Chloride 93 (*)    Glucose, Bld 282 (*)    Calcium 8.1 (*)    All other components within normal limits  URINALYSIS, ROUTINE W REFLEX MICROSCOPIC (NOT AT [facility]) - Abnormal; Notable for the following:    Glucose, UA 100 (*)    All other components within normal limits  GLUCOSE, CAPILLARY - Abnormal; Notable for the following:    Glucose-Capillary 102 (*)    All other components within normal limits  CULTURE, BLOOD (ROUTINE X 2)  CULTURE, BLOOD (ROUTINE X 2)  CULTURE, EXPECTORATED SPUTUM-ASSESSMENT  GRAM STAIN  HIV ANTIBODY (ROUTINE TESTING)  LEGIONELLA ANTIGEN, URINE  STREP PNEUMONIAE URINARY  ANTIGEN    Imaging Review Dg Chest Portable 1 View  07/26/2015   CLINICAL DATA:  Productive cough, shortness of breath.  EXAM: PORTABLE CHEST - 1 VIEW  COMPARISON:  February 26, 2015.  FINDINGS: Stable cardiomediastinal silhouette. Status post left shoulder arthroplasty. No pneumothorax is noted. Left-sided Port-A-Cath is again noted and unchanged. Left lung is clear. Large right pleural effusion is noted with probable underlying atelectasis or infiltrate. Fragmentation of proximal right humeral head is noted status post hardware removal.  IMPRESSION: Interval development of large right pleural effusion with probable underlying atelectasis or infiltrate.   Electronically Signed   By: Marijo Conception, M.D.   On: 07/26/2015 13:33     EKG Interpretation None        MDM   Final diagnoses:  Pleural effusion on right  Shortness of breath  Hypercarbia   69 year old female with advanced lung cancer who presents with worsening shortness of breath and cough for the past one week. Patient tachypneic with increased work of breathing that nontoxic in appearance. 95% on 2 L nasal cannula. Gave albuterol treatment as well as Solu-Medrol and obtained a portable chest x-ray in addition to labs listed above. Venous blood gas shows hypercarbia with a CO2 of 65; otherwise stable lab work. Portal chest x-ray shows worsening right pleural effusion with underlying  atelectasis versus infiltrate. I considered PE given risk factors but patient's condition is terminal and unlikely to be good candidate for anticoagulation.  Given the patient's progression of symptoms, I gave her ceftriaxone and azithromycin for pneumonia treatment. She may benefit from consideration of thoracentesis for symptom relief. She has not yet been set up with hospice and will require inpatient stabilization prior to discharge to hospice. Patient admitted to internal medicine for further care.  Sharlett Iles, MD 07/26/15 2250

## 2015-07-26 NOTE — ED Notes (Signed)
Pt placed on bedpan per request. Pt unable to produce a urine sample at this time. Will try again later.

## 2015-07-26 NOTE — H&P (Signed)
[facility] History and Physical  [name] YPP:[id number] DOB: Jul 23, 1946 DOA: 07/26/2015  Referring physician: Dr. Rex Kras - [facility] PCP: [name], MD   Chief Complaint: SOB  HPI: [name] is a 69 y.o. female    FOR ANY SIGNIFICANT CHANGES IN PTS CONDITION OR CONCERNS PLEASE CALL HER SON, GREG BRAY AT [phone].   1 week history of shortness of breath. Associated with productive yellow sputum sputum with cough. Oxycodone and Tessalon Perles with improvement. Patient states that she is placed on oxygen at [facility] today with improvement prior to coming to the ED. Patient states that she is actively trying to get on the hospice care and they're scheduled to come to her place of residence tomorrow. Symptoms are constant and getting worse. Denies any active chest pains, fevers, nausea, vomiting, palpitations, LOC, abdominal pain.  Review of Systems:  Constitutional: No  Fevers, chills.  HEENT:  No headaches, Difficulty swallowing,Tooth/dental problems,Sore throat,  No sneezing, itching, ear ache, nasal congestion, post nasal drip,  Cardio-vascular:  No chest pain, Orthopnea, PND, swelling in lower extremities, anasarca, dizziness, palpitations  GI:  No heartburn, indigestion, abdominal pain, nausea, vomiting, diarrhea, change in bowel habits,  Resp:   Per HPI Skin:  no rash or lesions.  GU:  no dysuria, change in color of urine, no urgency or frequency. No flank pain.  Musculoskeletal:   No joint pain or swelling. No decreased range of motion. No back pain.  Psych:  No change in mood or affect. No depression or anxiety. No memory loss.   Past Medical History  Diagnosis Date  . H/O: stroke 2012, 2000,     X3  . Anxiety   . PTSD (post-traumatic stress disorder)   . Depression   . SVD (spontaneous vaginal delivery)     x 1  . Smoker   . Osteoarthritis     knees, hips hands,   . Shortness of breath   . Pain     SEVERE PAIN BACK, RIGHT HIP AND  KNEES--HARRINGTON RODS AND CERVICAL PLATES--USES WALKER OR CANE WHEN AMBULATING - GOES TO A PAIN CLINIC-STATES SHE NEEDS RT HIP AND BOTH KNEES REPLACED. PT STATES SHE WAS TOLD THE CEMENT AROUND THE HARRINGTON RODS IS CRACKED.  [name] Hot flashes, menopausal     SEVERE  . Complication of anesthesia 03/02/13    severe headache,vertigo postop  . COPD (chronic obstructive pulmonary disease)     stable  . 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 Lung cancer   . Status post radiation therapy within four to twelve weeks 10/18/13-12/06/13    lung ca  66Gy  . Family history of anesthesia complication     equipment failure caused problem, suffered a stroke & heartattack  . Diabetes mellitus, type II   . Diabetes mellitus without complication   . Cancer   . Hypertension   . COPD (chronic obstructive pulmonary disease)   . Squamous cell lung cancer 10/02/2013  . Pneumonia   . Hypothyroidism   . History of stomach ulcers   . History of hiatal hernia   . PONV (postoperative nausea and vomiting)    Past Surgical History  Procedure Laterality Date  . Carpal tunnel release      BILATERAL  . Nasal sinus surgery    . Lumbar fusion      L4-S1  . Cervical fusion    . Hernia repair    . Salpingoophorectomy Right   . Vaginal hysterectomy  TAH w/ ovary removal  . Transthoracic echocardiogram  12-03-2011    LVSF NORMAL/ EF 60-65%  . Vulvectomy N/A 03/02/2013    Procedure: WIDE LOCAL EXCISION VULVAR;  Surgeon: Imagene Gurney A. Alycia Rossetti, MD;  Location: [facility];  Service: Gynecology;  Laterality: N/A;  . Co2 laser application N/A 7/67/3419    Procedure: LASER APPLICATION OF THE VULVA;  Surgeon: Janie Morning, MD;  Location: [facility];  Service: Gynecology;  Laterality: N/A;  . Video bronchoscopy Bilateral 09/23/2013    Procedure: VIDEO BRONCHOSCOPY WITHOUT FLUORO;  Surgeon: Tanda Rockers, MD;  Location: [facility];  Service: Cardiopulmonary;  Laterality: Bilateral;  . Hemiarthroplasty shoulder fracture  Left 05/25/2014    DR CHANDLER  . Reverse shoulder arthroplasty Left 05/24/2014    Procedure:   Hemi-Arthroplasty  Left Shoulder;  Surgeon: Nita Sells, MD;  Location: [facility];  Service: Orthopedics;  Laterality: Left;  . Orif humerus fracture Right 12/12/2014    Procedure: OPEN REDUCTION INTERNAL FIXATION (ORIF) PROXIMAL HUMERUS FRACTURE;  Surgeon: Nita Sells, MD;  Location: [facility];  Service: Orthopedics;  Laterality: Right;  . Tonsillectomy    . Hardware removal Right 06/22/2015    Procedure: HARDWARE REMOVAL GLENOHUMERAL SUSPENSION;  Surgeon: Tania Ade, MD;  Location: [facility];  Service: Orthopedics;  Laterality: Right;  Deep hardware removal of right shoulder   Social History:  reports that she has been smoking Cigarettes.  She has a 12.5 pack-year smoking history. She has never used smokeless tobacco. She reports that she does not drink alcohol or use illicit drugs.  Allergies  Allergen Reactions  . Carafate [Sucralfate] Nausea Only  . Prednisone Other (See Comments)    insomnia  . Nsaids     GI Upset  . Aspirin Other (See Comments)    Gi symptoms. Does NOT take ibuprofen or other NSAIDS  . Aspirin Nausea And Vomiting  . Ibuprofen     Gi upset  . Nsaids Nausea And Vomiting  . Dulera [Mometasone Furo-Formoterol Fum] Palpitations    Panting " my body was twisted inside and out"    Family History  Problem Relation Age of Onset  . Cancer Mother     COLON  . Depression Mother   . Heart disease Father   . Diabetes Maternal Aunt   . Cancer Maternal Grandfather     KIDNEY   . Diabetes Maternal Grandfather   . Hypertension Maternal Grandfather   . Heart disease Maternal Grandfather   . Drug abuse Sister      Prior to Admission medications   Medication Sig Start Date End Date Taking? Authorizing Provider  acetaminophen (TYLENOL) 325 MG tablet Take 650 mg by mouth every 6 (six) hours as needed for mild pain or moderate pain.    Yes Historical Provider, MD   albuterol (PROVENTIL HFA;VENTOLIN HFA) 108 (90 BASE) MCG/ACT inhaler 2 puffs every 4 - 6 hours as needed for dyspnea Patient taking differently: Inhale 2 puffs into the lungs every 4 (four) hours as needed (dyspnea).  04/07/15  Yes Patrici Ranks, MD  albuterol (PROVENTIL) (2.5 MG/3ML) 0.083% nebulizer solution Take 2.5 mg by nebulization every 6 (six) hours as needed for wheezing or shortness of breath.   Yes Historical Provider, MD  alprazolam Duanne Moron) 2 MG tablet Take 1 tablet (2 mg total) by mouth 3 (three) times daily. 05/31/15  Yes Cloria Spring, MD  ANORO ELLIPTA 62.5-25 MCG/INH AEPB Inhale 1 puff into the lungs daily. 03/17/15  Yes Historical Provider, MD  atenolol (TENORMIN) 50 MG tablet Take 1 tablet (50 mg total) by mouth daily. 03/15/15  Yes Estela Leonie Green, MD  chlorzoxazone (PARAFON) 500 MG tablet Take 1 tablet (500 mg total) by mouth 4 (four) times daily. 03/15/15  Yes Estela Leonie Green, MD  docusate sodium (COLACE) 100 MG capsule Take 200 mg by mouth 2 (two) times daily.    Yes Historical Provider, MD  esomeprazole (NEXIUM) 40 MG capsule Take 1 capsule (40 mg total) by mouth daily. 03/15/15  Yes Estela Leonie Green, MD  fentaNYL (DURAGESIC - DOSED MCG/HR) 25 MCG/HR patch Place 1 patch (25 mcg total) onto the skin every 3 (three) days. 07/25/15  Yes Manon Hilding Kefalas, PA-C  fluticasone (FLONASE) 50 MCG/ACT nasal spray Place 1 spray into both nostrils daily as needed for allergies or rhinitis. 03/15/15  Yes Estela Leonie Green, MD  guaiFENesin (MUCINEX) 600 MG 12 hr tablet Take 1,200 mg by mouth 2 (two) times daily.   Yes Historical Provider, MD  insulin aspart (NOVOLOG FLEXPEN) 100 UNIT/ML FlexPen Inject 5 Units into the skin 3 (three) times daily with meals. Patient taking differently: Inject 4-11 Units into the skin 3 (three) times daily with meals. 4 units before breakfast if b/s is 70 or greater,9 units before lunch and supper(if patient is eating) or if b/s 70  or greater,11 units for b/s greater than 200 03/15/15  Yes Estela Leonie Green, MD  Insulin Glargine (LANTUS SOLOSTAR) 100 UNIT/ML Solostar Pen Inject 20 Units into the skin daily at 10 pm. Patient taking differently: Inject 21 Units into the skin daily at 10 pm.  03/15/15  Yes Erline Hau, MD  levothyroxine (SYNTHROID) 125 MCG tablet Take 1 tablet (125 mcg total) by mouth daily before breakfast. 06/20/15  Yes Manon Hilding Kefalas, PA-C  lidocaine (LIDODERM) 5 % Place 1 patch onto the skin daily. Remove & Discard patch within 12 hours or as directed by MD   Yes Historical Provider, MD  lidocaine-prilocaine (EMLA) cream Apply a quarter size amount to port site 1 hour prior to chemo. Do not rub in. Cover with plastic wrap. 06/20/15  Yes Baird Cancer, PA-C  lisinopril (PRINIVIL,ZESTRIL) 5 MG tablet Take 1 tablet (5 mg total) by mouth daily. 03/15/15  Yes Erline Hau, MD  loratadine (CLARITIN) 10 MG tablet Take 10 mg by mouth daily.   Yes Historical Provider, MD  MOVANTIK 25 MG TABS tablet Take 25 mg by mouth daily. 05/19/15  Yes Historical Provider, MD  ondansetron (ZOFRAN) 8 MG tablet Take 1 tablet (8 mg total) by mouth every 8 (eight) hours as needed for nausea or vomiting. 04/07/15  Yes Patrici Ranks, MD  oxyCODONE-acetaminophen (PERCOCET/ROXICET) 5-325 MG per tablet Take 1 tablet by mouth every 4 (four) hours as needed for moderate pain or severe pain. 07/21/15  Yes Thomas S Kefalas, PA-C  SENEXON-S 8.6-50 MG per tablet TAKE 1 TABLET BY MOUTH TWICE DAILY. 07/18/15  Yes Patrici Ranks, MD  sertraline (ZOLOFT) 100 MG tablet Take 2 tablets (200 mg total) by mouth daily. 05/31/15  Yes Cloria Spring, MD  traZODone (DESYREL) 150 MG tablet Take 2 tablets (300 mg total) by mouth at bedtime. 05/31/15  Yes Cloria Spring, MD  Insulin Pen Needle (NOVOFINE) 30G X 8 MM MISC Inject 1 packet into the skin as needed.    Historical Provider, MD  spironolactone (ALDACTONE) 50 MG tablet Take  2 tablets (100 mg total) by mouth  daily. Patient not taking: Reported on 07/24/2015 05/23/15   Manon Hilding Kefalas, PA-C  tiotropium (SPIRIVA) 18 MCG inhalation capsule Place 1 capsule (18 mcg total) into inhaler and inhale daily. Patient not taking: Reported on 07/24/2015 03/15/15   Erline Hau, MD   Physical Exam: Filed Vitals:   07/26/15 1400 07/26/15 1500 07/26/15 1530 07/26/15 1600  BP: 169/86 160/78 160/77 142/74  Pulse:  81 82 79  Temp:      TempSrc:      Resp: '17 20 18 18  '$ Height:      Weight:      SpO2:  99% 97% 95%    Wt Readings from Last 3 Encounters:  07/26/15 72.576 kg (160 lb)  07/24/15 73.12 kg (161 lb 3.2 oz)  07/19/15 72.666 kg (160 lb 3.2 oz)    General: Appears to be in mild distress Eyes:  PERRL, normal lids, irises & conjunctiva ENT:  grossly normal hearing, lips & tongue Neck:  no LAD, masses or thyromegaly Cardiovascular:  RRR, III/VI systolic murmur. Trace LE edema Telemetry:  SR, no arrhythmias  Respiratory: Diffuse crackles and significantly diminished breath sounds in right lower lung field. Intermittent wheezing. Increased work of breathing. Abdomen:  soft, ntnd Skin:  no rash or induration seen on limited exam Musculoskeletal:  grossly normal tone BUE/BLE Psychiatric:  grossly normal mood and affect, speech fluent and appropriate Neurologic:  grossly non-focal.          Labs on Admission:  Basic Metabolic Panel:  Recent Labs Lab 07/26/15 1304  NA 133*  K 3.7  CL 93*  CO2 32  GLUCOSE 282*  BUN 6  CREATININE 0.63  CALCIUM 8.1*   Liver Function Tests: No results for input(s): AST, ALT, ALKPHOS, BILITOT, PROT, ALBUMIN in the last 168 hours. No results for input(s): LIPASE, AMYLASE in the last 168 hours. No results for input(s): AMMONIA in the last 168 hours. CBC:  Recent Labs Lab 07/26/15 1304  WBC 7.7  NEUTROABS 6.4  HGB 9.5*  HCT 31.5*  MCV 91.6  PLT 269   Cardiac Enzymes: No results for input(s): CKTOTAL, CKMB,  CKMBINDEX, TROPONINI in the last 168 hours.  BNP (last 3 results)  Recent Labs  02/26/15 0857  BNP 468.0*    ProBNP (last 3 results) No results for input(s): PROBNP in the last 8760 hours.  CBG: No results for input(s): GLUCAP in the last 168 hours.  Radiological Exams on Admission: Dg Chest Portable 1 View  07/26/2015   CLINICAL DATA:  Productive cough, shortness of breath.  EXAM: PORTABLE CHEST - 1 VIEW  COMPARISON:  February 26, 2015.  FINDINGS: Stable cardiomediastinal silhouette. Status post left shoulder arthroplasty. No pneumothorax is noted. Left-sided Port-A-Cath is again noted and unchanged. Left lung is clear. Large right pleural effusion is noted with probable underlying atelectasis or infiltrate. Fragmentation of proximal right humeral head is noted status post hardware removal.  IMPRESSION: Interval development of large right pleural effusion with probable underlying atelectasis or infiltrate.   Electronically Signed   By: Marijo Conception, M.D.   On: 07/26/2015 13:33     Assessment/Plan Principal Problem:   Acute respiratory failure Active Problems:   Shortness of breath   End of life care   Essential hypertension   Diabetes mellitus without complication   HCAP (healthcare-associated pneumonia)   Hypothyroid   Depression with anxiety   GERD without esophagitis   Acute Respiratory Failure: Likely secondary to significant right pleural effusion which is likely  secondary to advanced squamous cell carcinoma of the lung as well as possible underlying pneumonia (HCAP). Patient feeling much better after Solu-Medrol and one hour of CAT. Patient does not wish to have thoracentesis at this time as she is moving towards hospice. Hospice is set up to come to her place of residence on 07/26/2015. Discussed case with social work who consulted with hospice and it was determined that they will be unable to establish services for the patient for the next several days. - Med surge -  Ceftaz/Vanc - IV lasix '40mg'$  Qday - O2 PRN - Duonebs Q4 Q2prn - Prednisone '60mg'$  - morphine - Do not escalate management  Squamous cell carcinoma of the lung: Advanced. Patient has established contact with hospice has not yet been set up in her home. After discussion with social work it is likely that patient will be placed on inpatient hospice and if stable will be transferred home on hospice versus staying inpatient on hospice until she passes it is likely over the next several days. Patient aware of this prognosis. - Consult social work and case management for hospice.  - continue home fentanyl patch, parafon  Hypothyroid: - continue synthroid  Depression/Anxiety: - continue xanx - continue zoloft  Diabetes:  - SSI  HTN: - continue atenolol, lisinopril  GERD: - continue PPI  Insomnia: - continue trazodone  FOR ANY SIGNIFICANT CHANGES IN PTS CONDITION OR CONCERNS PLEASE CALL HER SON, GREG BRAY AT [phone].  Code Status: DNR DVT Prophylaxis: Hep Family Communication: Son Disposition Plan: Pending improvement vs hospice  Vearl Aitken Lenna Sciara, MD Family Medicine [facility] [email or web address] Password [email or web address]

## 2015-07-26 NOTE — ED Notes (Signed)
MD Merrell at bedside.

## 2015-07-26 NOTE — Progress Notes (Signed)
ANTIBIOTIC CONSULT NOTE - INITIAL  Pharmacy Consult for Azithromycin and ceftriaxone Indication: pneumonia  Allergies  Allergen Reactions  . Carafate [Sucralfate] Nausea Only  . Prednisone Other (See Comments)    insomnia  . Nsaids     GI Upset  . Aspirin Other (See Comments)    Gi symptoms. Does NOT take ibuprofen or other NSAIDS  . Aspirin Nausea And Vomiting  . Ibuprofen     Gi upset  . Nsaids Nausea And Vomiting  . Dulera [Mometasone Furo-Formoterol Fum] Palpitations    Panting " my body was twisted inside and out"    Patient Measurements: Height: '5\' 6"'$  (167.6 cm) Weight: 160 lb (72.576 kg) IBW/kg (Calculated) : 59.3   Vital Signs: Temp: 98 F (36.7 C) (07/27 1248) Temp Source: Oral (07/27 1248) BP: 160/78 mmHg (07/27 1500) Pulse Rate: 81 (07/27 1500) Intake/Output from previous day:   Intake/Output from this shift:    Labs:  Recent Labs  07/26/15 1304  WBC 7.7  HGB 9.5*  PLT 269  CREATININE 0.63   Estimated Creatinine Clearance: 69.6 mL/min (by C-G formula based on Cr of 0.63). No results for input(s): VANCOTROUGH, VANCOPEAK, VANCORANDOM, GENTTROUGH, GENTPEAK, GENTRANDOM, TOBRATROUGH, TOBRAPEAK, TOBRARND, AMIKACINPEAK, AMIKACINTROU, AMIKACIN in the last 72 hours.   Microbiology: No results found for this or any previous visit (from the past 720 hour(s)).  Medical History: Past Medical History  Diagnosis Date  . H/O: stroke 2012, 2000,     X3  . Anxiety   . PTSD (post-traumatic stress disorder)   . Depression   . SVD (spontaneous vaginal delivery)     x 1  . Smoker   . Osteoarthritis     knees, hips hands,   . Shortness of breath   . Pain     SEVERE PAIN BACK, RIGHT HIP AND KNEES--HARRINGTON RODS AND CERVICAL PLATES--USES WALKER OR CANE WHEN AMBULATING - GOES TO A PAIN CLINIC-STATES SHE NEEDS RT HIP AND BOTH KNEES REPLACED. PT STATES SHE WAS TOLD THE CEMENT AROUND THE HARRINGTON RODS IS CRACKED.  [name] Hot flashes, menopausal     SEVERE  .  Complication of anesthesia 03/02/13    severe headache,vertigo postop  . COPD (chronic obstructive pulmonary disease)     stable  . 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 Lung cancer   . Status post radiation therapy within four to twelve weeks 10/18/13-12/06/13    lung ca  66Gy  . Family history of anesthesia complication     equipment failure caused problem, suffered a stroke & heartattack  . Diabetes mellitus, type II   . Diabetes mellitus without complication   . Cancer   . Hypertension   . COPD (chronic obstructive pulmonary disease)   . Squamous cell lung cancer 10/02/2013  . Pneumonia   . Hypothyroidism   . History of stomach ulcers   . History of hiatal hernia   . PONV (postoperative nausea and vomiting)     Medications:  See medication history Assessment: 69 yo lady to start ceftriaxone and azithromycin for CAP.    Goal of Therapy:  Eradication of infection  Plan:  Continue ceftriaxone 1 gm IV q24 hours and azithromycin 500 mg IV q24 hours Pharmacy will sign off as no dose adjustment needed Please re-consult as needed  [name] 07/26/2015,3:23 PM

## 2015-07-26 NOTE — ED Notes (Signed)
Pt completed albuterol tx. Pt placed back on 3L nasal cannula.

## 2015-07-26 NOTE — ED Notes (Addendum)
Per EMS, pt from [facility] with hx of lung cancer called out for increasingly worse shortness of breath and productive cough with yellow sputum x 1 week. Pt is supposed to begin hospice tomorrow. Pt 84% on RA. Pt 99% on 15L NRB. Pt given 1 albuterol tx PTA. Pt denies CP at this time.

## 2015-07-26 NOTE — ED Notes (Signed)
MD Little at bedside.

## 2015-07-26 NOTE — ED Notes (Signed)
Respiratory paged for neb tx and blood gas.

## 2015-07-26 NOTE — ED Notes (Signed)
CRITICAL VALUE ALERT  Critical value received:  co2 94.7  Date of notification:  07/26/15  Time of notification:  5498  Critical value read back: yes  Nurse who received alert:  Jeni Salles RN  MD notified (1st page):  Little Time of first page:  1410  MD notified (2nd page):  Time of second page:  Responding MD:  [id number]  Time MD responded:  1410

## 2015-07-26 NOTE — ED Notes (Signed)
Pt sats dropped to 88-89% on RA. Placed pt on 3L [place].

## 2015-07-27 DIAGNOSIS — J9601 Acute respiratory failure with hypoxia: Secondary | ICD-10-CM | POA: Diagnosis not present

## 2015-07-27 DIAGNOSIS — J90 Pleural effusion, not elsewhere classified: Secondary | ICD-10-CM | POA: Diagnosis not present

## 2015-07-27 LAB — HIV ANTIBODY (ROUTINE TESTING W REFLEX): HIV SCREEN 4TH GENERATION: NONREACTIVE

## 2015-07-27 LAB — GLUCOSE, CAPILLARY
Glucose-Capillary: 364 mg/dL — ABNORMAL HIGH (ref 65–99)
Glucose-Capillary: 243 mg/dL — ABNORMAL HIGH (ref 65–99)

## 2015-07-27 MED ORDER — ALPRAZOLAM 2 MG PO TABS: 2.0000 mg | ORAL_TABLET | Freq: Three times a day (TID) | ORAL | Status: AC

## 2015-07-27 MED ORDER — FENTANYL 25 MCG/HR TD PT72: 25.0000 ug | MEDICATED_PATCH | TRANSDERMAL | Status: AC

## 2015-07-27 MED ORDER — BENZONATATE 100 MG PO CAPS
100.0000 mg | ORAL_CAPSULE | Freq: Two times a day (BID) | ORAL | Status: DC | PRN
Start: 2015-07-27 — End: 2015-07-27
  Administered 2015-07-27: 100 mg via ORAL
  Filled 2015-07-27: qty 1

## 2015-07-27 MED ORDER — OXYCODONE HCL 5 MG PO TABS
10.0000 mg | ORAL_TABLET | ORAL | Status: DC | PRN
  Administered 2015-07-27: 10 mg via ORAL
  Filled 2015-07-27: qty 2

## 2015-07-27 MED ORDER — OXYCODONE HCL 10 MG PO TABS: 10.0000 mg | ORAL_TABLET | ORAL | Status: AC | PRN

## 2015-07-27 NOTE — Clinical Social Work Note (Addendum)
[facility]. CSW shared this with pt and she accepts. Hospice is also coming to discuss services with pt now. CSW spoke with son, [name], but he lost signal. He states he has spoken with Hospice today. Greg plans to call CSW back later this afternoon. CSW will fax d/c summary upon completion. Out of facility DNR sent with pt. Pt to transport via [facility].   Update: [name] son called back and said he wanted pt to go to [facility]. CSW discussed with pt who would [facility]. CSW notified son and he states, "If that's what she wants." He is aware that he will need to complete some paperwork at [facility] and states he will do so today.   Benay Pike, [place]

## 2015-07-27 NOTE — Care Management Note (Signed)
Case Management Note  Patient Details  Name: [name] MRN: [id number] Date of Birth: 01-29-1946  Subjective/Objective:                  Pt admitted from [facility]F with acute respiratory failure and stage 4 lung ca. Pt is now under hospice care and wants to go to [facility].  Action/Plan: CSW to arrange discharge to [facility] today.  Expected Discharge Date:                  Expected Discharge Plan:  [place]  In-House Referral:  Clinical Social Work  Discharge planning Services  CM Consult  Post Acute Care Choice:  Hospice Choice offered to:  Patient  DME Arranged:    DME Agency:     HH Arranged:    [place] Agency:     Status of Service:  Completed, signed off  Medicare Important Message Given:    Date Medicare IM Given:    Medicare IM give by:    Date Additional Medicare IM Given:    Additional Medicare Important Message give by:     If discussed at [place] of Stay Meetings, dates discussed:    Additional Comments:  Joylene Draft, RN 07/27/2015, 1:04 PM

## 2015-07-27 NOTE — Progress Notes (Signed)
Patient refuses lasix and solu-medrol. Patient request Oxycodone 10 mg, instead of Morphine. Patient also request tessalon pearls. Dr. Jerilee Hoh notified.

## 2015-07-27 NOTE — Progress Notes (Signed)
Inpatient Diabetes Program Recommendations  AACE/ADA: New Consensus Statement on Inpatient Glycemic Control (2013)  Target Ranges:  Prepandial:   less than 140 mg/dL      Peak postprandial:   less than 180 mg/dL (1-2 hours)      Critically ill patients:  140 - 180 mg/dL   Results for [name], [name] (MRN [id number]) as of 07/27/2015 09:29  Ref. Range 07/26/2015 17:56 07/26/2015 22:45 07/27/2015 07:41  Glucose-Capillary Latest Ref Range: 65-99 mg/dL 102 (H) 366 (H) 364 (H)   Reason for Visit: Right pleural effusion/?HCAP  Diabetes history: DM 2 Outpatient Diabetes medications: Lantus 21 units QHS, Novolog 4-11 TID Current orders for Inpatient glycemic control: Novolog Moderate + HS scale Patient on IV Solumedrol 60 mg Daily  Inpatient Diabetes Program Recommendations Insulin - Basal: Patient takes 21 units of basal insulin at home. Please consider ordering Lantus 20 units Q 24hrs.  Thanks,  Tama Headings RN, MSN, [facility] Inpatient Diabetes Coordinator Team Pager [phone]

## 2015-07-27 NOTE — Discharge Summary (Signed)
Physician Discharge Summary  [name] KXF:[id number] DOB: [birth year] DOA: 07/26/2015  PCP: [name], MD  Admit date: 07/26/2015 Discharge date: 07/27/2015  Time spent: 45 minutes  Recommendations for Outpatient Follow-up:  -Will be discharged to residential hospice today for end of life care.   Discharge Diagnoses:  Principal Problem:   Acute respiratory failure Active Problems:   Shortness of breath   End of life care   Essential hypertension   Diabetes mellitus without complication   HCAP (healthcare-associated pneumonia)   Hypothyroid   Depression with anxiety   GERD without esophagitis   Discharge Condition: Stable  Filed Weights   07/26/15 1248 07/26/15 1704  Weight: 72.576 kg (160 lb) 73.755 kg (162 lb 9.6 oz)    History of present illness:  week history of shortness of breath. Associated with productive yellow sputum sputum with cough. Oxycodone and Tessalon Perles with improvement. Patient states that she is placed on oxygen at [facility] today with improvement prior to coming to the ED. Patient states that she is actively trying to get on the hospice care and they're scheduled to come to her place of residence tomorrow. Symptoms are constant and getting worse. Denies any active chest pains, fevers, nausea, vomiting, palpitations, LOC, abdominal pain.   Hospital Course:  Acute Hypoxemic Respiratory Failure -Related to large right malignant pleural effusion. -Patient does NOT WANT thoracentesis now or in the future as she is interested in hospice care. -Also very clear that she does not want antibiotics now or in the future. -Can continue to use oxygen as needed at the hospice home.  Squamous Cell Cancer of the Lung -Plan for hospice at present. -Continue fentanyl patch and PRn oxycodone for pain management.  Hypothyroidism -Continue synthroid.  HTN -Well controlled. -Continue atenolol and lisinopril.  GERD -Continue PPI  Insomnia -Continue  trazodone  Depression/Anxiety -Continue xanax/zoloft  Procedures:  None   Consultations:  None  Discharge Instructions  Discharge Instructions    Increase activity slowly    Complete by:  As directed             Medication List    STOP taking these medications        fluticasone 50 MCG/ACT nasal spray  Commonly known as:  FLONASE     insulin aspart 100 UNIT/ML FlexPen  Commonly known as:  NOVOLOG FLEXPEN     Insulin Pen Needle 30G X 8 MM Misc  Commonly known as:  NOVOFINE     lidocaine 5 %  Commonly known as:  LIDODERM     lidocaine-prilocaine cream  Commonly known as:  EMLA     oxyCODONE-acetaminophen 5-325 MG per tablet  Commonly known as:  PERCOCET/ROXICET     spironolactone 50 MG tablet  Commonly known as:  ALDACTONE     tiotropium 18 MCG inhalation capsule  Commonly known as:  SPIRIVA      TAKE these medications        acetaminophen 325 MG tablet  Commonly known as:  TYLENOL  Take 650 mg by mouth every 6 (six) hours as needed for mild pain or moderate pain.     albuterol (2.5 MG/3ML) 0.083% nebulizer solution  Commonly known as:  PROVENTIL  Take 2.5 mg by nebulization every 6 (six) hours as needed for wheezing or shortness of breath.     albuterol 108 (90 BASE) MCG/ACT inhaler  Commonly known as:  PROVENTIL HFA;VENTOLIN HFA  2 puffs every 4 - 6 hours as needed for dyspnea  alprazolam 2 MG tablet  Commonly known as:  XANAX  Take 1 tablet (2 mg total) by mouth 3 (three) times daily.     ANORO ELLIPTA 62.5-25 MCG/INH Aepb  Generic drug:  Umeclidinium-Vilanterol  Inhale 1 puff into the lungs daily.     atenolol 50 MG tablet  Commonly known as:  TENORMIN  Take 1 tablet (50 mg total) by mouth daily.     chlorzoxazone 500 MG tablet  Commonly known as:  PARAFON  Take 1 tablet (500 mg total) by mouth 4 (four) times daily.     docusate sodium 100 MG capsule  Commonly known as:  COLACE  Take 200 mg by mouth 2 (two) times daily.      esomeprazole 40 MG capsule  Commonly known as:  NEXIUM  Take 1 capsule (40 mg total) by mouth daily.     fentaNYL 25 MCG/HR patch  Commonly known as:  DURAGESIC - dosed mcg/hr  Place 1 patch (25 mcg total) onto the skin every 3 (three) days.     guaiFENesin 600 MG 12 hr tablet  Commonly known as:  MUCINEX  Take 1,200 mg by mouth 2 (two) times daily.     Insulin Glargine 100 UNIT/ML Solostar Pen  Commonly known as:  LANTUS SOLOSTAR  Inject 20 Units into the skin daily at 10 pm.     levothyroxine 125 MCG tablet  Commonly known as:  SYNTHROID  Take 1 tablet (125 mcg total) by mouth daily before breakfast.     lisinopril 5 MG tablet  Commonly known as:  PRINIVIL,ZESTRIL  Take 1 tablet (5 mg total) by mouth daily.     loratadine 10 MG tablet  Commonly known as:  CLARITIN  Take 10 mg by mouth daily.     MOVANTIK 25 MG Tabs tablet  Generic drug:  naloxegol oxalate  Take 25 mg by mouth daily.     ondansetron 8 MG tablet  Commonly known as:  ZOFRAN  Take 1 tablet (8 mg total) by mouth every 8 (eight) hours as needed for nausea or vomiting.     Oxycodone HCl 10 MG Tabs  Take 1 tablet (10 mg total) by mouth every 4 (four) hours as needed for severe pain.     SENEXON-S 8.6-50 MG per tablet  Generic drug:  senna-docusate  TAKE 1 TABLET BY MOUTH TWICE DAILY.     sertraline 100 MG tablet  Commonly known as:  ZOLOFT  Take 2 tablets (200 mg total) by mouth daily.     traZODone 150 MG tablet  Commonly known as:  DESYREL  Take 2 tablets (300 mg total) by mouth at bedtime.       Allergies  Allergen Reactions  . Carafate [Sucralfate] Nausea Only  . Prednisone Other (See Comments)    insomnia  . Nsaids     GI Upset  . Aspirin Other (See Comments)    Gi symptoms. Does NOT take ibuprofen or other NSAIDS  . Aspirin Nausea And Vomiting  . Ibuprofen     Gi upset  . Nsaids Nausea And Vomiting  . Dulera [Mometasone Furo-Formoterol Fum] Palpitations    Panting " my body was  twisted inside and out"      The results of significant diagnostics from this hospitalization (including imaging, microbiology, ancillary and laboratory) are listed below for reference.    Significant Diagnostic Studies: Dg Shoulder Right  06/28/2015   CLINICAL DATA:  Right shoulder pain and drainage. History of removal of fracture fixation  hardware is 06/22/2015.  EXAM: RIGHT SHOULDER - 2+ VIEW  COMPARISON:  Intraoperative views of the right shoulder 12/12/2014. CT right shoulder 12/11/2014.  FINDINGS: Plate and screws in the proximal right humerus and the humeral head have been removed. 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 No acute fracture is identified. Acromioclavicular degenerative change is noted. No soft tissue gas collection is seen. The patient has a small to moderate appearing right pleural effusion with basilar airspace disease.  IMPRESSION: Status post removal of fracture fixation hardware and the head of the right humerus. No acute abnormality is identified with heterotopic ossification about the surgical site noted.  Acromioclavicular degenerative disease.  Right pleural effusion and basilar airspace disease.   Electronically Signed   By: Inge Rise M.D.   On: 06/28/2015 09:25   Ct Chest W Contrast  07/19/2015   CLINICAL DATA:  Lung cancer diagnosed 2 years ago. Restaging post chemotherapy and radiation therapy. Subsequent encounter.  EXAM: CT CHEST AND ABDOMEN WITH CONTRAST  TECHNIQUE: Multidetector CT imaging of the chest and abdomen was performed following the standard protocol during bolus administration of intravenous contrast.  CONTRAST:  176m OMNIPAQUE IOHEXOL 300 MG/ML  SOLN  COMPARISON:  Prior exams 04/13/2015. Right shoulder radiographs 06/28/2015.  FINDINGS: CT CHEST FINDINGS  Mediastinum/Nodes: Interval enlargement of multiple small mediastinal lymph nodes. These include a high right paratracheal node measuring 9 mm on image number 7, and 8 mm right  paratracheal node on image 13 and an 11 mm low right paratracheal node on image 23. Right hilar enlargement is grossly stable, largely due to occlusion of the bronchus intermedius and surrounding soft tissue density. Previously described right infrahilar mass is not well delineated due to further collapse of the right middle and lower lobes. The thyroid gland, trachea and esophagus demonstrate no significant findings. There is stable mild cardiomegaly. There is a small pericardial effusion which has enlarged. There is diffuse atherosclerosis of the aorta, great vessels and coronary arteries.  Lungs/Pleura: Enlarging right pleural effusion is now moderate in volume. No definite pleural nodularity. There is a stable small left pleural effusion. As above, there is no complete collapse of the right middle and lower lobes. There are increased patchy airspace opacities throughout the aerated lungs, superimposed on moderate underlying emphysema. Some of these are nodular in configuration, and superimposed metastatic disease cannot be excluded.  Musculoskeletal/Chest wall: As demonstrated on recent radiographs, there is fragmentation of the proximal right humerus status post removal of the surgical hardware and excision of the humeral head. No glenoid destruction identified. There is a complex right shoulder joint effusion with marked synovial thickening. The left shoulder arthroplasty appears grossly stable. No metastases identified.  CT ABDOMEN FINDINGS  Hepatobiliary: The liver is normal in density without focal abnormality. No evidence of gallstones or gallbladder wall thickening. There is mild extrahepatic biliary dilatation and a small amount of air is present within the common bile duct.  Pancreas: Stable pancreatic atrophy. No evidence of pancreatic mass, ductal dilatation or surrounding inflammation.  Spleen: Normal in size without focal abnormality.  Adrenals/Urinary Tract: Both adrenal glands appear normal.The  kidneys appear normal without evidence of urinary tract calculus or hydronephrosis. Bladder not imaged.  Stomach/Bowel: No evidence of bowel wall thickening, distention or surrounding inflammatory change.The appendix appears normal.  Vascular/Lymphatic: There are no enlarged abdominal lymph nodes. Diffuse aortoiliac atherosclerosis appears grossly stable.  Other: No evidence of abdominal wall mass or hernia.  Musculoskeletal: No acute or significant osseous findings. Stable diffuse degenerative  and postsurgical changes within the spine.  IMPRESSION: 1. Progressive collapse of the right middle and lower lobes. The bronchus intermedius is completely occluded. This may be secondary to surrounding tumor and/or endobronchial tumor/ mucous plugging. 2. Enlarging right pleural effusion without definite nodularity. 3. Progressive patchy airspace opacities within the visualized lungs. There are some nodular components, and metastatic disease cannot be excluded. 4. Some progression in the small mediastinal lymph nodes is noted. This is nonspecific and potentially reactive. 5. No evidence of abdominal metastatic disease.   Electronically Signed   By: Richardean Sale M.D.   On: 07/19/2015 10:23   Ct Abdomen W Contrast  07/19/2015   CLINICAL DATA:  Lung cancer diagnosed 2 years ago. Restaging post chemotherapy and radiation therapy. Subsequent encounter.  EXAM: CT CHEST AND ABDOMEN WITH CONTRAST  TECHNIQUE: Multidetector CT imaging of the chest and abdomen was performed following the standard protocol during bolus administration of intravenous contrast.  CONTRAST:  157m OMNIPAQUE IOHEXOL 300 MG/ML  SOLN  COMPARISON:  Prior exams 04/13/2015. Right shoulder radiographs 06/28/2015.  FINDINGS: CT CHEST FINDINGS  Mediastinum/Nodes: Interval enlargement of multiple small mediastinal lymph nodes. These include a high right paratracheal node measuring 9 mm on image number 7, and 8 mm right paratracheal node on image 13 and an 11 mm  low right paratracheal node on image 23. Right hilar enlargement is grossly stable, largely due to occlusion of the bronchus intermedius and surrounding soft tissue density. Previously described right infrahilar mass is not well delineated due to further collapse of the right middle and lower lobes. The thyroid gland, trachea and esophagus demonstrate no significant findings. There is stable mild cardiomegaly. There is a small pericardial effusion which has enlarged. There is diffuse atherosclerosis of the aorta, great vessels and coronary arteries.  Lungs/Pleura: Enlarging right pleural effusion is now moderate in volume. No definite pleural nodularity. There is a stable small left pleural effusion. As above, there is no complete collapse of the right middle and lower lobes. There are increased patchy airspace opacities throughout the aerated lungs, superimposed on moderate underlying emphysema. Some of these are nodular in configuration, and superimposed metastatic disease cannot be excluded.  Musculoskeletal/Chest wall: As demonstrated on recent radiographs, there is fragmentation of the proximal right humerus status post removal of the surgical hardware and excision of the humeral head. No glenoid destruction identified. There is a complex right shoulder joint effusion with marked synovial thickening. The left shoulder arthroplasty appears grossly stable. No metastases identified.  CT ABDOMEN FINDINGS  Hepatobiliary: The liver is normal in density without focal abnormality. No evidence of gallstones or gallbladder wall thickening. There is mild extrahepatic biliary dilatation and a small amount of air is present within the common bile duct.  Pancreas: Stable pancreatic atrophy. No evidence of pancreatic mass, ductal dilatation or surrounding inflammation.  Spleen: Normal in size without focal abnormality.  Adrenals/Urinary Tract: Both adrenal glands appear normal.The kidneys appear normal without evidence of  urinary tract calculus or hydronephrosis. Bladder not imaged.  Stomach/Bowel: No evidence of bowel wall thickening, distention or surrounding inflammatory change.The appendix appears normal.  Vascular/Lymphatic: There are no enlarged abdominal lymph nodes. Diffuse aortoiliac atherosclerosis appears grossly stable.  Other: No evidence of abdominal wall mass or hernia.  Musculoskeletal: No acute or significant osseous findings. Stable diffuse degenerative and postsurgical changes within the spine.  IMPRESSION: 1. Progressive collapse of the right middle and lower lobes. The bronchus intermedius is completely occluded. This may be secondary to surrounding tumor and/or endobronchial  tumor/ mucous plugging. 2. Enlarging right pleural effusion without definite nodularity. 3. Progressive patchy airspace opacities within the visualized lungs. There are some nodular components, and metastatic disease cannot be excluded. 4. Some progression in the small mediastinal lymph nodes is noted. This is nonspecific and potentially reactive. 5. No evidence of abdominal metastatic disease.   Electronically Signed   By: Richardean Sale M.D.   On: 07/19/2015 10:23   Dg Chest Portable 1 View  07/26/2015   CLINICAL DATA:  Productive cough, shortness of breath.  EXAM: PORTABLE CHEST - 1 VIEW  COMPARISON:  February 26, 2015.  FINDINGS: Stable cardiomediastinal silhouette. Status post left shoulder arthroplasty. No pneumothorax is noted. Left-sided Port-A-Cath is again noted and unchanged. Left lung is clear. Large right pleural effusion is noted with probable underlying atelectasis or infiltrate. Fragmentation of proximal right humeral head is noted status post hardware removal.  IMPRESSION: Interval development of large right pleural effusion with probable underlying atelectasis or infiltrate.   Electronically Signed   By: Marijo Conception, M.D.   On: 07/26/2015 13:33    Microbiology: Recent Results (from the past 240 hour(s))    Culture, blood (routine x 2) Call MD if unable to obtain prior to antibiotics being given     Status: None (Preliminary result)   Collection Time: 07/26/15  4:56 PM  Result Value Ref Range Status   Specimen Description BLOOD PORTA CATH DRAWN BY RN  Final   Special Requests   Final    BOTTLES DRAWN AEROBIC AND ANAEROBIC AEB=8CC ANA=6CC   Culture NO GROWTH < 24 HOURS  Final   Report Status PENDING  Incomplete  Culture, blood (routine x 2) Call MD if unable to obtain prior to antibiotics being given     Status: None (Preliminary result)   Collection Time: 07/26/15  5:01 PM  Result Value Ref Range Status   Specimen Description BLOOD RIGHT ANTECUBITAL  Final   Special Requests BOTTLES DRAWN AEROBIC AND ANAEROBIC 6CC EACH  Final   Culture NO GROWTH < 24 HOURS  Final   Report Status PENDING  Incomplete     Labs: Basic Metabolic Panel:  Recent Labs Lab 07/26/15 1304  NA 133*  K 3.7  CL 93*  CO2 32  GLUCOSE 282*  BUN 6  CREATININE 0.63  CALCIUM 8.1*   Liver Function Tests: No results for input(s): AST, ALT, ALKPHOS, BILITOT, PROT, ALBUMIN in the last 168 hours. No results for input(s): LIPASE, AMYLASE in the last 168 hours. No results for input(s): AMMONIA in the last 168 hours. CBC:  Recent Labs Lab 07/26/15 1304  WBC 7.7  NEUTROABS 6.4  HGB 9.5*  HCT 31.5*  MCV 91.6  PLT 269   Cardiac Enzymes: No results for input(s): CKTOTAL, CKMB, CKMBINDEX, TROPONINI in the last 168 hours. BNP: BNP (last 3 results)  Recent Labs  02/26/15 0857  BNP 468.0*    ProBNP (last 3 results) No results for input(s): PROBNP in the last 8760 hours.  CBG:  Recent Labs Lab 07/26/15 1756 07/26/15 2245 07/27/15 0741 07/27/15 1119  GLUCAP 102* 366* 364* 243*       Signed:  HERNANDEZ ACOSTA,ESTELA  [facility] Pager: [phone] 07/27/2015, 1:32 PM

## 2015-07-27 NOTE — Clinical Social Work Note (Addendum)
Clinical Social Work Assessment  Patient Details  Name: [name] MRN: [id number] Date of Birth: Jan 15, 1946  Date of referral:  07/27/15               Reason for consult:  End of Life/Hospice                Permission sought to share information with:  Family Supports Permission granted to share information::  Yes, Verbal Permission Granted  Name::     Special educational needs teacher::     Relationship::  son  Contact Information:     Housing/Transportation Living arrangements for the past 2 months:  [place] of Information:  Patient Patient Interpreter Needed:  None Criminal Activity/Legal Involvement Pertinent to Current Situation/Hospitalization:  No - Comment as needed Significant Relationships:  Adult Children Lives with:  Facility Resident Do you feel safe going back to the place where you live?  No Need for family participation in patient care:  Yes (Comment)  Care giving concerns:  Pt has been at ALF, but does not want to return.    Social Worker assessment / plan:  CSW met with pt at bedside. Pt alert and oriented and known to CSW from previous admissions. She states she was diagnosed with lung cancer about 2 years ago and decided just this week to stop treatment. Referral was made to hospice and they were to see pt today. Yesterday at [facility], pt states she became very short of breath. She has felt miserable for the last two days and is relieved to be more comfortable now. [name] son, [name] is described as involved and supportive. CSW discussed options with pt and she does not want to return to [facility]. Referral sent to hospice and they will see pt today. If no bed available, MD plans to make GIP. Pt is requesting to be able to smoke. Hospice Home notified and states as long as safe for pt to get up, this is allowed.   Employment status:  Retired Forensic scientist:  Medicare PT Recommendations:  Not assessed at this time [place] / Referral to community  resources:  Other (Comment Required) (Residential hospice)  Patient/Family's Response to care:  Pt requesting hospice consult and would like to transfer to [place] when bed available.   Patient/Family's Understanding of and Emotional Response to Diagnosis, Current Treatment, and Prognosis:  Pt reports understanding of very poor prognosis and appears to have accepted this. Support provided. She discussed with oncology several days ago and decision was made to consult hospice, which was supposed to happen at [facility] today.   Emotional Assessment Appearance:  Appears older than stated age Attitude/Demeanor/Rapport:  Other (Calm) Affect (typically observed):  Accepting Orientation:  Oriented to Self, Oriented to  Time, Oriented to Place, Oriented to Situation Alcohol / Substance use:  Tobacco Use Psych involvement (Current and /or in the community):  No (Comment)  Discharge Needs  Concerns to be addressed:  Coping/Stress Concerns Readmission within the last 30 days:  No Current discharge risk:  Terminally ill Barriers to Discharge:  Hospice Bed not available   Salome Arnt, [place] 07/27/2015, 11:12 AM [phone]

## 2015-07-27 NOTE — Care Management Note (Signed)
Case Management Note  Patient Details  Name: [name] MRN: [id number] Date of Birth: 04-Dec-1946  Subjective/Objective:                    Action/Plan:   Expected Discharge Date:                  Expected Discharge Plan:  [name]  In-House Referral:  Clinical Social Work  Discharge planning Services  CM Consult  Post Acute Care Choice:  Hospice Choice offered to:  Patient  DME Arranged:    DME Agency:     HH Arranged:    [place] Agency:     Status of Service:  Completed, signed off  Medicare Important Message Given:    Date Medicare IM Given:    Medicare IM give by:    Date Additional Medicare IM Given:    Additional Medicare Important Message give by:     If discussed at [place] of Stay Meetings, dates discussed:    Additional Comments: Pt signed Medicare OBS notification form Joylene Draft, RN 07/27/2015, 1:06 PM

## 2015-07-27 NOTE — Progress Notes (Signed)
Patient with orders to be discharge to Hospice home. Report called to Maudie Mercury, Therapist, sports. Discharge packet sent with patient. Patient stable. Patient transported via EMS.

## 2015-07-31 LAB — CULTURE, BLOOD (ROUTINE X 2)
CULTURE: NO GROWTH
Culture: NO GROWTH

## 2015-08-01 NOTE — Patient Outreach (Signed)
[facility]) Care Management  08/01/2015  ARYAN BELLO 1946-11-30 [id number]   Referral from [place] List, assigned Burgess Amor, RN to outreach.  Ronnell Freshwater. [place], [place] Management [place] Assistant Phone: [phone] Fax: [phone]

## 2015-08-02 ENCOUNTER — Inpatient Hospital Stay (HOSPITAL_COMMUNITY): Payer: Self-pay

## 2015-08-02 ENCOUNTER — Ambulatory Visit (HOSPITAL_COMMUNITY): Payer: Self-pay | Admitting: Oncology

## 2015-08-08 ENCOUNTER — Inpatient Hospital Stay (HOSPITAL_COMMUNITY): Payer: Self-pay

## 2015-08-08 ENCOUNTER — Encounter (HOSPITAL_COMMUNITY): Payer: Medicare Other

## 2015-08-15 ENCOUNTER — Other Ambulatory Visit: Payer: Self-pay | Admitting: *Deleted

## 2015-08-15 NOTE — Patient Outreach (Signed)
Call to patient phone, fast busy. Call to other phone listed, it was [facility] facility, spoke with Director, Lynelle Smoke. She confirmed patient was discharged to [facility] home on July 31, 2015. Plan to close screening and sign off as patient is under Hospice care. Royetta Crochet. Laymond Purser, RN, BSN, [place] [phone]

## 2015-08-16 ENCOUNTER — Ambulatory Visit (HOSPITAL_COMMUNITY): Payer: Self-pay | Admitting: Hematology & Oncology

## 2015-08-16 ENCOUNTER — Inpatient Hospital Stay (HOSPITAL_COMMUNITY): Payer: Self-pay

## 2015-08-18 NOTE — Patient Outreach (Signed)
[facility]) Care Management  08/15/2015  [name] [birth year] [id number]   Notification from Burgess Amor, RN to close case due to patient is enrolled in Hospice care.  Thanks, Ronnell Freshwater. [place], [place] Assistant Phone: [phone] Fax: [phone]

## 2015-08-31 ENCOUNTER — Ambulatory Visit (HOSPITAL_COMMUNITY): Payer: Self-pay | Admitting: Psychiatry

## 2015-09-01 IMAGING — CR DG CHEST 1V
1 series · 1 of 1 positions shown · non-contrast
Comparison: Multiple priors dating back to chest CT 11/21/2014.

CLINICAL DATA: Fall yesterday. LEFT shoulder surgery through [facility] ago.
Weakness. History of lung cancer.

EXAM:
CHEST - 1 VIEW

[view not recorded]
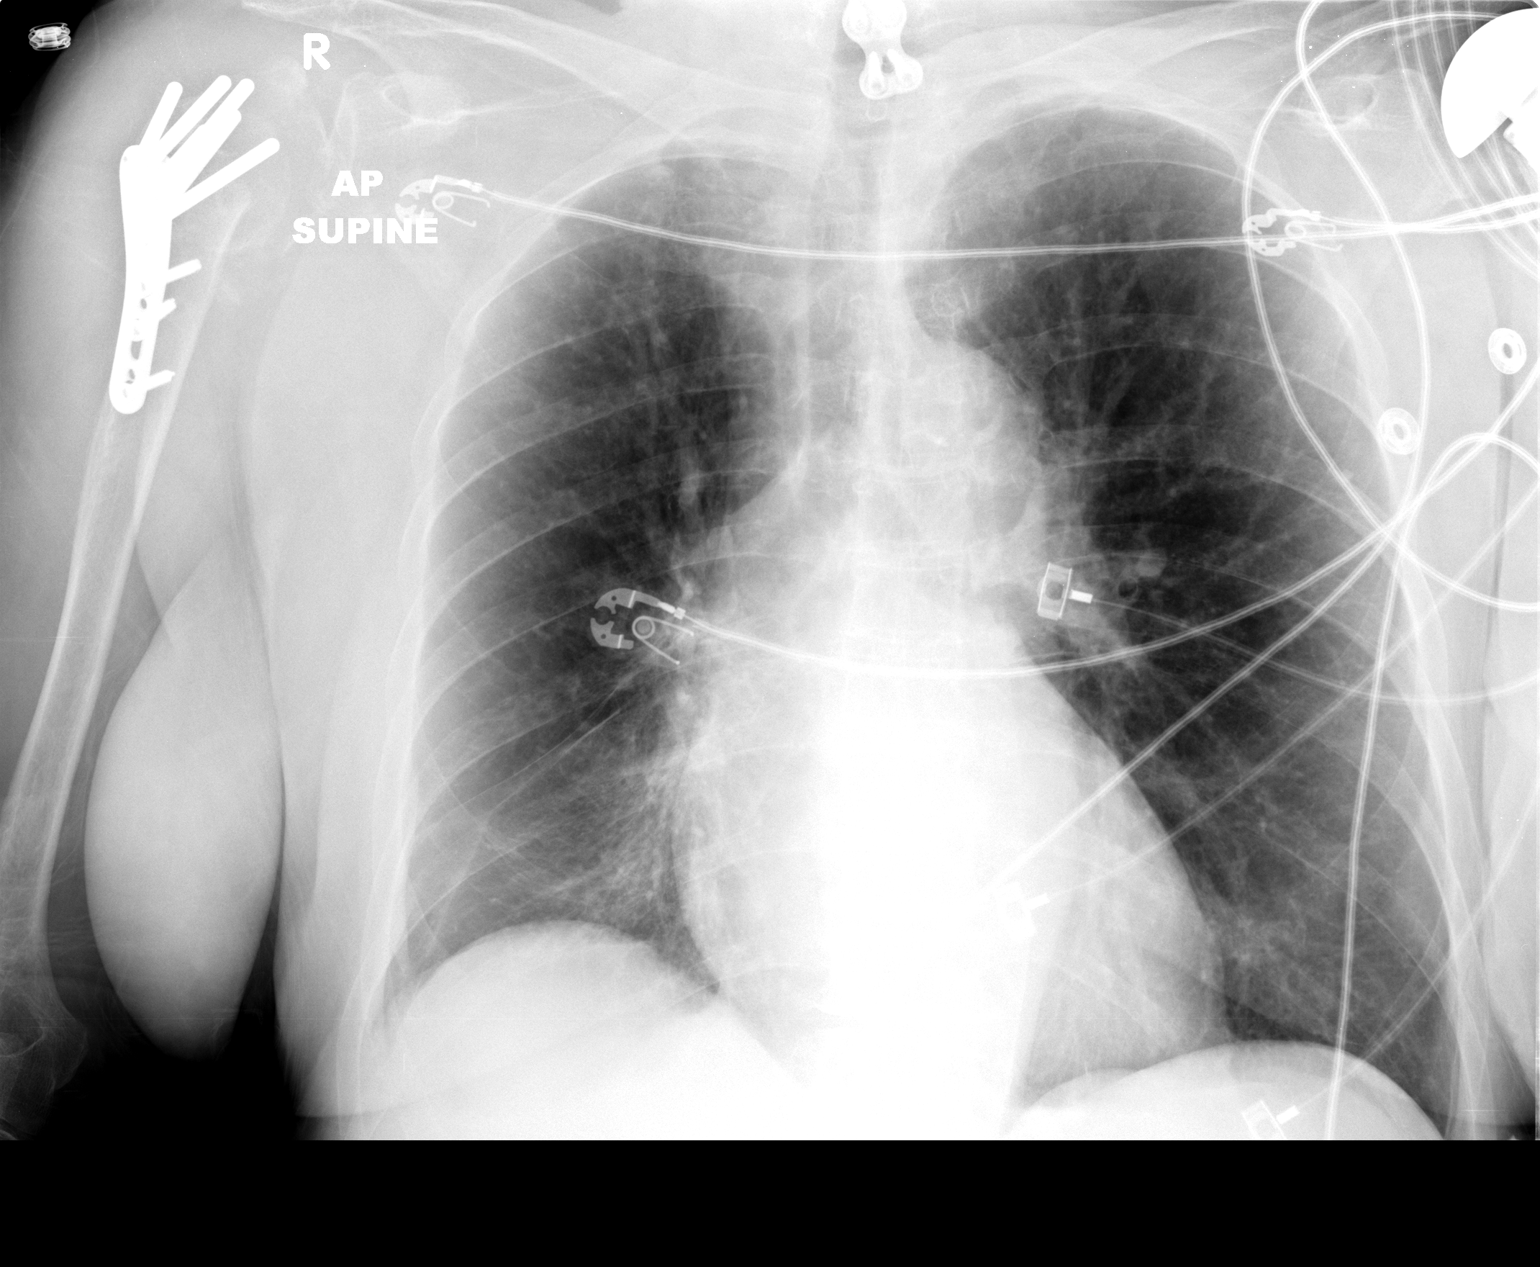

[1 of 1 positions shown; findings below may reference images not displayed]

FINDINGS: Cardiopericardial silhouette within normal limits for size. Tortuous
calcified aortic arch. Lower cervical ACDF. LEFT shoulder
hemiarthroplasty and RIGHT shoulder buttress plate and screw
fixation.

There is no pneumothorax. No displaced rib fractures are identified.
The RIGHT hilar nodule is poorly visible radiographically but
appears similar to the prior exams. Cicatricial changes are present
in the RIGHT infrahilar region, probably secondary to radiation
therapy.

No airspace disease. No pleural effusion. Monitoring leads project
over the chest.
IMPRESSION: Chronic findings of the chest without acute cardiopulmonary disease.
RIGHT infrahilar nodule and probable post radiation changes of the
RIGHT middle and RIGHT lower lobe appear similar to prior studies.

## 2015-09-26 NOTE — Addendum Note (Signed)
Addendum  created 09/26/15 1093 by Roberts Gaudy, MD   Modules edited: Notes Section   Notes Section:  File: [id number]

## 2015-10-31 DEATH — deceased

## 2016-02-26 IMAGING — DX DG SHOULDER 2+V*R*
2 series · 2 of 2 positions shown · non-contrast
Comparison: Intraoperative views of the right shoulder 12/12/2014.
CT right shoulder 12/11/2014.

CLINICAL DATA: Right shoulder pain and drainage. History of removal
of fracture fixation hardware is 06/22/2015.

EXAM:
RIGHT SHOULDER - 2+ VIEW

[shoulder grashey]
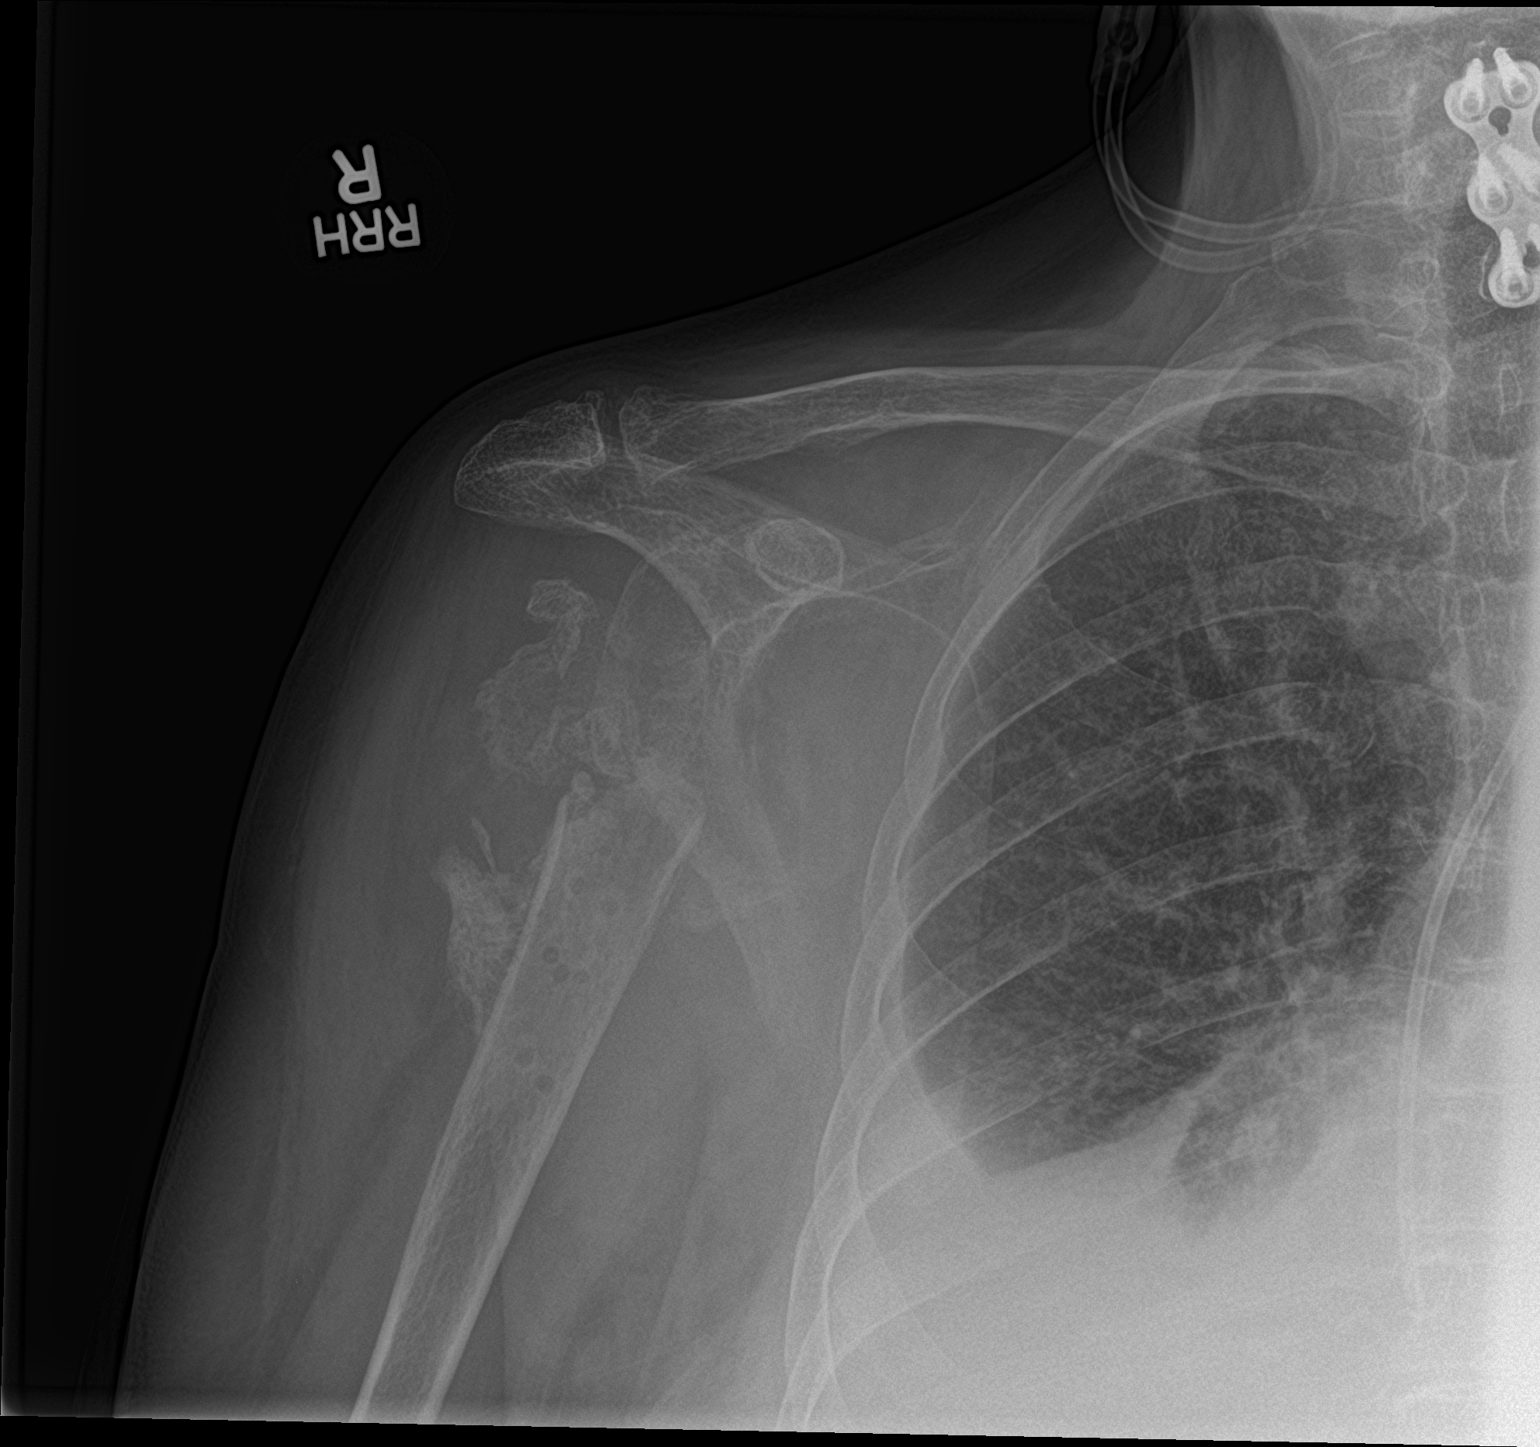

[shoulder y view]
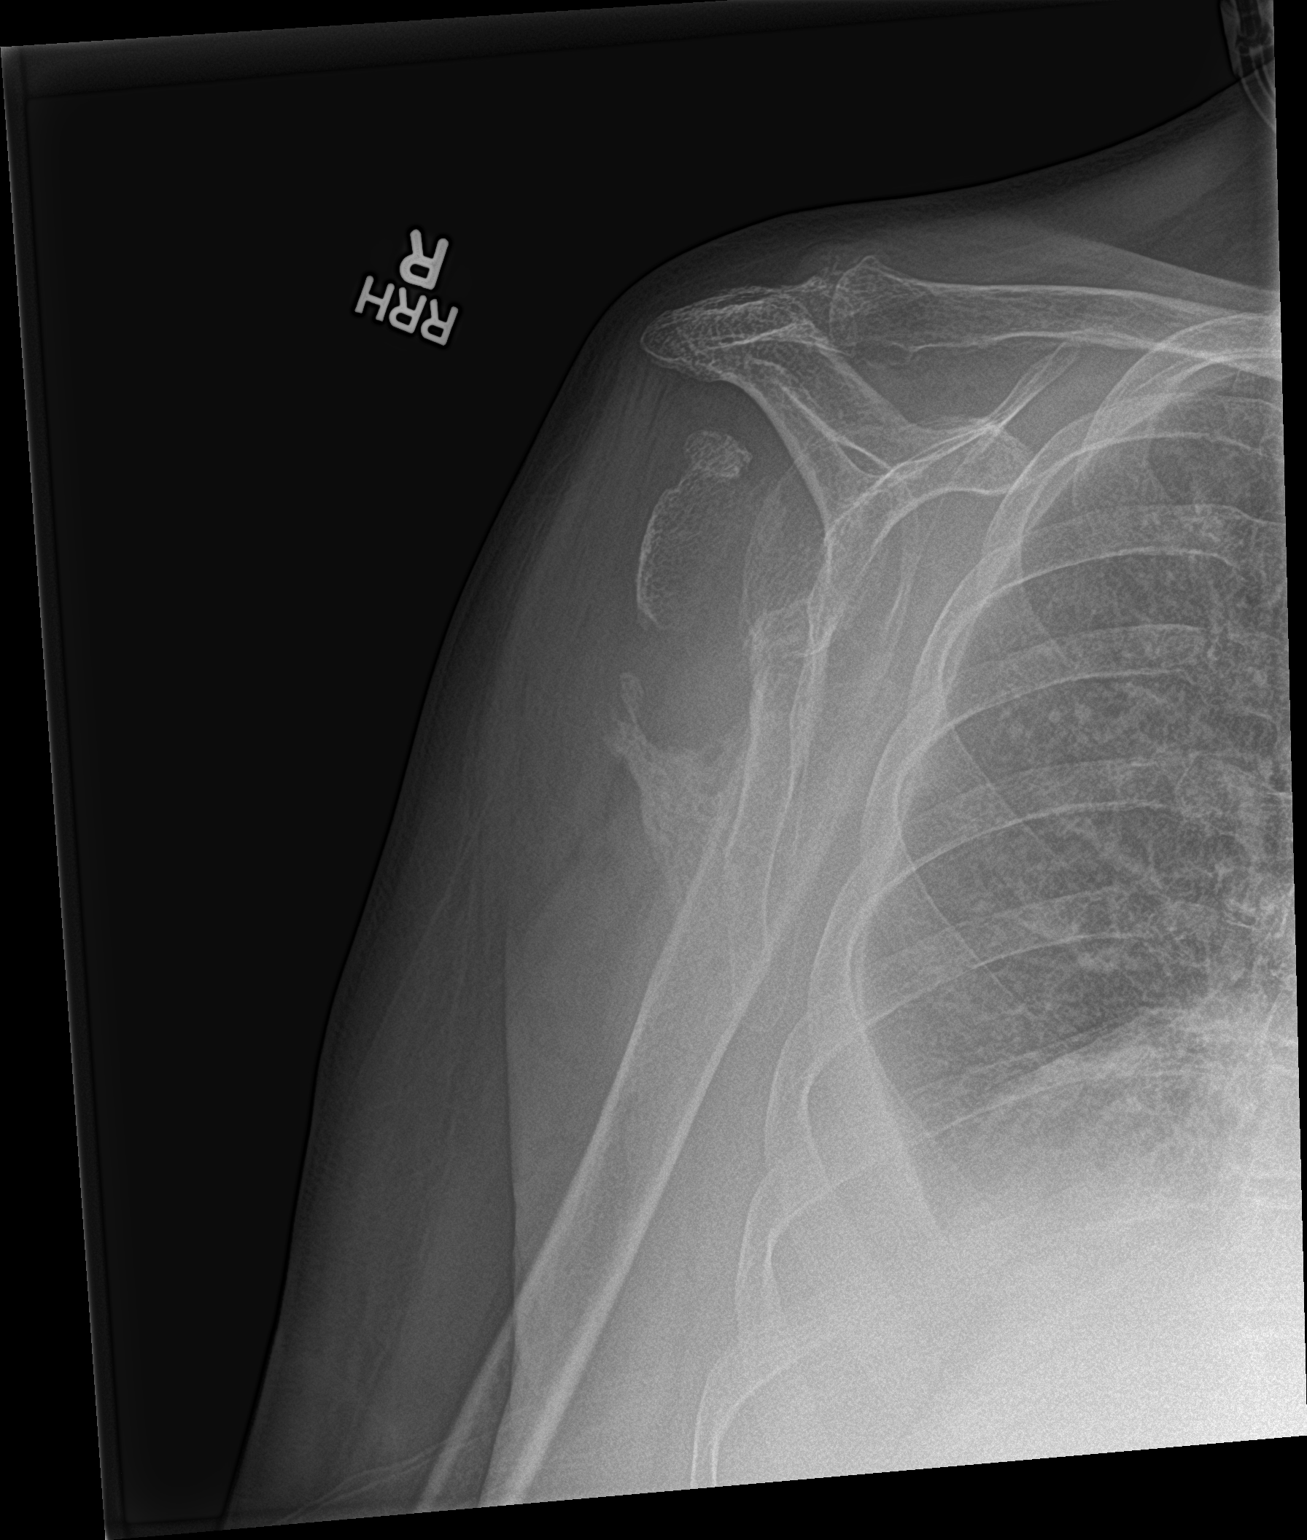

[2 of 2 positions shown; findings below may reference images not displayed]

FINDINGS: Plate and screws in the proximal right humerus and the humeral head
have been removed. There is heterotopic ossification about the
shoulder. No acute fracture is identified. Acromioclavicular
degenerative change is noted. No soft tissue gas collection is seen.
The patient has a small to moderate appearing right pleural effusion
with basilar airspace disease.
IMPRESSION: Status post removal of fracture fixation hardware and the head of
the right humerus. No acute abnormality is identified with
heterotopic ossification about the surgical site noted.

Acromioclavicular degenerative disease.

Right pleural effusion and basilar airspace disease.
